# Patient Record
Sex: Female | Born: 1935 | Race: Black or African American | Hispanic: No | Smoking: Never smoker
Health system: Southern US, Community
[De-identification: ages and names within clinical notes are randomized; demographics above are authoritative.]

## PROBLEM LIST (undated history)

## (undated) DIAGNOSIS — C8590 Non-Hodgkin lymphoma, unspecified, unspecified site: Secondary | ICD-10-CM

## (undated) DIAGNOSIS — I10 Essential (primary) hypertension: Secondary | ICD-10-CM

## (undated) DIAGNOSIS — E876 Hypokalemia: Principal | ICD-10-CM

## (undated) DIAGNOSIS — C7951 Secondary malignant neoplasm of bone: Secondary | ICD-10-CM

## (undated) DIAGNOSIS — R197 Diarrhea, unspecified: Secondary | ICD-10-CM

## (undated) DIAGNOSIS — Z923 Personal history of irradiation: Secondary | ICD-10-CM

## (undated) DIAGNOSIS — C9501 Acute leukemia of unspecified cell type, in remission: Secondary | ICD-10-CM

## (undated) DIAGNOSIS — J168 Pneumonia due to other specified infectious organisms: Secondary | ICD-10-CM

## (undated) DIAGNOSIS — R05 Cough: Secondary | ICD-10-CM

## (undated) DIAGNOSIS — Z5189 Encounter for other specified aftercare: Secondary | ICD-10-CM

## (undated) DIAGNOSIS — R569 Unspecified convulsions: Secondary | ICD-10-CM

## (undated) DIAGNOSIS — M1990 Unspecified osteoarthritis, unspecified site: Secondary | ICD-10-CM

## (undated) DIAGNOSIS — H9312 Tinnitus, left ear: Principal | ICD-10-CM

## (undated) DIAGNOSIS — I82401 Acute embolism and thrombosis of unspecified deep veins of right lower extremity: Secondary | ICD-10-CM

## (undated) DIAGNOSIS — C50919 Malignant neoplasm of unspecified site of unspecified female breast: Secondary | ICD-10-CM

## (undated) HISTORY — DX: Personal history of irradiation: Z92.3

## (undated) HISTORY — DX: Hypokalemia: E87.6

## (undated) HISTORY — PX: APPENDECTOMY: SHX54

## (undated) HISTORY — DX: Diarrhea, unspecified: R19.7

## (undated) HISTORY — DX: Encounter for other specified aftercare: Z51.89

## (undated) HISTORY — PX: TONSILLECTOMY: SUR1361

## (undated) HISTORY — DX: Unspecified osteoarthritis, unspecified site: M19.90

## (undated) HISTORY — DX: Tinnitus, left ear: H93.12

## (undated) HISTORY — DX: Non-Hodgkin lymphoma, unspecified, unspecified site: C85.90

## (undated) HISTORY — DX: Cough: R05

## (undated) HISTORY — DX: Unspecified convulsions: R56.9

## (undated) HISTORY — DX: Acute leukemia of unspecified cell type, in remission: C95.01

## (undated) HISTORY — PX: MASTECTOMY: SHX3

## (undated) HISTORY — DX: Acute embolism and thrombosis of unspecified deep veins of right lower extremity: I82.401

## (undated) HISTORY — DX: Pneumonia due to other specified infectious organisms: J16.8

## (undated) HISTORY — DX: Secondary malignant neoplasm of bone: C79.51

## (undated) HISTORY — DX: Essential (primary) hypertension: I10

---

## 1963-11-04 HISTORY — PX: THYROIDECTOMY: SHX17

## 1979-07-05 DIAGNOSIS — R569 Unspecified convulsions: Secondary | ICD-10-CM

## 1979-07-05 HISTORY — DX: Unspecified convulsions: R56.9

## 2002-03-28 ENCOUNTER — Encounter: Payer: Self-pay | Admitting: Emergency Medicine

## 2002-03-28 ENCOUNTER — Emergency Department (HOSPITAL_COMMUNITY): Admission: EM | Admit: 2002-03-28 | Discharge: 2002-03-28 | Payer: Self-pay | Admitting: Emergency Medicine

## 2005-01-24 ENCOUNTER — Emergency Department (HOSPITAL_COMMUNITY): Admission: EM | Admit: 2005-01-24 | Discharge: 2005-01-24 | Payer: Self-pay | Admitting: Emergency Medicine

## 2007-11-04 DIAGNOSIS — Z5189 Encounter for other specified aftercare: Secondary | ICD-10-CM

## 2007-11-04 DIAGNOSIS — IMO0001 Reserved for inherently not codable concepts without codable children: Secondary | ICD-10-CM

## 2007-11-04 HISTORY — DX: Encounter for other specified aftercare: Z51.89

## 2007-11-04 HISTORY — DX: Reserved for inherently not codable concepts without codable children: IMO0001

## 2008-08-03 DIAGNOSIS — C50919 Malignant neoplasm of unspecified site of unspecified female breast: Secondary | ICD-10-CM

## 2008-08-03 DIAGNOSIS — C50912 Malignant neoplasm of unspecified site of left female breast: Secondary | ICD-10-CM

## 2008-08-03 HISTORY — DX: Malignant neoplasm of unspecified site of unspecified female breast: C50.919

## 2008-08-29 ENCOUNTER — Inpatient Hospital Stay (HOSPITAL_COMMUNITY): Admission: EM | Admit: 2008-08-29 | Discharge: 2008-09-04 | Payer: Self-pay | Admitting: Emergency Medicine

## 2008-08-30 ENCOUNTER — Ambulatory Visit: Payer: Self-pay | Admitting: Oncology

## 2008-08-31 ENCOUNTER — Encounter: Admission: RE | Admit: 2008-08-31 | Discharge: 2008-08-31 | Payer: Self-pay | Admitting: Internal Medicine

## 2008-08-31 ENCOUNTER — Encounter (INDEPENDENT_AMBULATORY_CARE_PROVIDER_SITE_OTHER): Payer: Self-pay | Admitting: Diagnostic Radiology

## 2008-09-01 ENCOUNTER — Ambulatory Visit: Payer: Self-pay | Admitting: Cardiology

## 2008-09-01 ENCOUNTER — Ambulatory Visit: Payer: Self-pay | Admitting: Oncology

## 2008-09-01 ENCOUNTER — Encounter: Payer: Self-pay | Admitting: Oncology

## 2008-09-06 ENCOUNTER — Ambulatory Visit: Admission: RE | Admit: 2008-09-06 | Discharge: 2008-11-01 | Payer: Self-pay | Admitting: Radiation Oncology

## 2008-09-07 LAB — CBC WITH DIFFERENTIAL (CANCER CENTER ONLY)
BASO%: 1 % (ref 0.0–2.0)
Eosinophils Absolute: 0.4 10*3/uL (ref 0.0–0.5)
HCT: 24.8 % — ABNORMAL LOW (ref 34.8–46.6)
HGB: 8.2 g/dL — ABNORMAL LOW (ref 11.6–15.9)
LYMPH#: 4.7 10*3/uL — ABNORMAL HIGH (ref 0.9–3.3)
MCV: 77 fL — ABNORMAL LOW (ref 81–101)
MONO#: 0.9 10*3/uL (ref 0.1–0.9)
NEUT%: 51.4 % (ref 39.6–80.0)
RBC: 3.21 10*6/uL — ABNORMAL LOW (ref 3.70–5.32)
RDW: 17 % — ABNORMAL HIGH (ref 10.5–14.6)
WBC: 12.5 10*3/uL — ABNORMAL HIGH (ref 3.9–10.0)

## 2008-09-07 LAB — CMP (CANCER CENTER ONLY)
Alkaline Phosphatase: 58 U/L (ref 26–84)
BUN, Bld: 9 mg/dL (ref 7–22)
CO2: 26 mEq/L (ref 18–33)
Creat: 0.7 mg/dl (ref 0.6–1.2)
Glucose, Bld: 107 mg/dL (ref 73–118)
Sodium: 141 mEq/L (ref 128–145)
Total Bilirubin: 0.4 mg/dl (ref 0.20–1.60)

## 2008-09-07 LAB — IRON AND TIBC
%SAT: 15 % — ABNORMAL LOW (ref 20–55)
Iron: 47 ug/dL (ref 42–145)
TIBC: 306 ug/dL (ref 250–470)
UIBC: 259 ug/dL

## 2008-09-07 LAB — FERRITIN: Ferritin: 626 ng/mL — ABNORMAL HIGH (ref 10–291)

## 2008-09-12 ENCOUNTER — Ambulatory Visit (HOSPITAL_COMMUNITY): Admission: RE | Admit: 2008-09-12 | Discharge: 2008-09-12 | Payer: Self-pay | Admitting: Oncology

## 2008-09-19 ENCOUNTER — Encounter (HOSPITAL_COMMUNITY): Admission: RE | Admit: 2008-09-19 | Discharge: 2008-11-02 | Payer: Self-pay | Admitting: Oncology

## 2008-09-19 LAB — CBC WITH DIFFERENTIAL (CANCER CENTER ONLY)
BASO#: 0.1 10*3/uL (ref 0.0–0.2)
HCT: 23.1 % — ABNORMAL LOW (ref 34.8–46.6)
HGB: 7.6 g/dL — ABNORMAL LOW (ref 11.6–15.9)
LYMPH#: 3.7 10*3/uL — ABNORMAL HIGH (ref 0.9–3.3)
LYMPH%: 39.5 % (ref 14.0–48.0)
MCHC: 32.7 g/dL (ref 32.0–36.0)
MCV: 78 fL — ABNORMAL LOW (ref 81–101)
MONO#: 0.4 10*3/uL (ref 0.1–0.9)
NEUT%: 51.9 % (ref 39.6–80.0)
WBC: 9.4 10*3/uL (ref 3.9–10.0)

## 2008-09-19 LAB — BASIC METABOLIC PANEL - CANCER CENTER ONLY
Calcium: 9.3 mg/dL (ref 8.0–10.3)
Creat: 0.7 mg/dl (ref 0.6–1.2)
Sodium: 136 mEq/L (ref 128–145)

## 2008-09-22 ENCOUNTER — Ambulatory Visit: Payer: Self-pay | Admitting: Oncology

## 2008-09-22 LAB — TYPE & CROSSMATCH - CHCC SATELLITE

## 2008-10-02 LAB — CMP (CANCER CENTER ONLY)
ALT(SGPT): 20 U/L (ref 10–47)
AST: 26 U/L (ref 11–38)
Albumin: 3.1 g/dL — ABNORMAL LOW (ref 3.3–5.5)
Alkaline Phosphatase: 59 U/L (ref 26–84)
BUN, Bld: 10 mg/dL (ref 7–22)
Calcium: 9.5 mg/dL (ref 8.0–10.3)
Chloride: 104 mEq/L (ref 98–108)
Potassium: 4.2 mEq/L (ref 3.3–4.7)

## 2008-10-02 LAB — CBC WITH DIFFERENTIAL (CANCER CENTER ONLY)
BASO#: 0 10*3/uL (ref 0.0–0.2)
EOS%: 4.2 % (ref 0.0–7.0)
HCT: 32.6 % — ABNORMAL LOW (ref 34.8–46.6)
HGB: 10.7 g/dL — ABNORMAL LOW (ref 11.6–15.9)
LYMPH%: 35.5 % (ref 14.0–48.0)
MCH: 26.7 pg (ref 26.0–34.0)
MCHC: 32.8 g/dL (ref 32.0–36.0)
MONO%: 13.6 % — ABNORMAL HIGH (ref 0.0–13.0)
NEUT%: 46 % (ref 39.6–80.0)

## 2008-10-16 LAB — CBC WITH DIFFERENTIAL (CANCER CENTER ONLY)
BASO%: 0.6 % (ref 0.0–2.0)
EOS%: 5.3 % (ref 0.0–7.0)
Eosinophils Absolute: 0.3 10*3/uL (ref 0.0–0.5)
LYMPH%: 17.9 % (ref 14.0–48.0)
MCH: 26.5 pg (ref 26.0–34.0)
MCV: 80 fL — ABNORMAL LOW (ref 81–101)
MONO%: 5.6 % (ref 0.0–13.0)
NEUT#: 3.9 10*3/uL (ref 1.5–6.5)
Platelets: 289 10*3/uL (ref 145–400)
RBC: 3.72 10*6/uL (ref 3.70–5.32)
RDW: 16.7 % — ABNORMAL HIGH (ref 10.5–14.6)

## 2008-10-16 LAB — BASIC METABOLIC PANEL - CANCER CENTER ONLY
BUN, Bld: 11 mg/dL (ref 7–22)
Calcium: 9.7 mg/dL (ref 8.0–10.3)
Chloride: 104 mEq/L (ref 98–108)
Creat: 0.9 mg/dl (ref 0.6–1.2)

## 2008-10-30 ENCOUNTER — Ambulatory Visit: Payer: Self-pay | Admitting: Oncology

## 2008-10-31 LAB — CMP (CANCER CENTER ONLY)
ALT(SGPT): 19 U/L (ref 10–47)
AST: 28 U/L (ref 11–38)
Alkaline Phosphatase: 72 U/L (ref 26–84)
CO2: 28 mEq/L (ref 18–33)
Creat: 0.9 mg/dl (ref 0.6–1.2)
Sodium: 139 mEq/L (ref 128–145)
Total Bilirubin: 0.5 mg/dl (ref 0.20–1.60)
Total Protein: 8.7 g/dL — ABNORMAL HIGH (ref 6.4–8.1)

## 2008-10-31 LAB — CBC WITH DIFFERENTIAL (CANCER CENTER ONLY)
BASO%: 0.7 % (ref 0.0–2.0)
Eosinophils Absolute: 0.2 10*3/uL (ref 0.0–0.5)
LYMPH#: 1 10*3/uL (ref 0.9–3.3)
LYMPH%: 20.9 % (ref 14.0–48.0)
MONO#: 0.6 10*3/uL (ref 0.1–0.9)
NEUT#: 2.8 10*3/uL (ref 1.5–6.5)
Platelets: 273 10*3/uL (ref 145–400)
RBC: 4.13 10*6/uL (ref 3.70–5.32)
RDW: 16 % — ABNORMAL HIGH (ref 10.5–14.6)
WBC: 4.6 10*3/uL (ref 3.9–10.0)

## 2008-11-02 ENCOUNTER — Ambulatory Visit (HOSPITAL_COMMUNITY): Admission: RE | Admit: 2008-11-02 | Discharge: 2008-11-02 | Payer: Self-pay | Admitting: Oncology

## 2008-11-08 LAB — CBC WITH DIFFERENTIAL (CANCER CENTER ONLY)
BASO#: 0 10*3/uL (ref 0.0–0.2)
Eosinophils Absolute: 0.2 10*3/uL (ref 0.0–0.5)
HCT: 32.1 % — ABNORMAL LOW (ref 34.8–46.6)
HGB: 10.6 g/dL — ABNORMAL LOW (ref 11.6–15.9)
LYMPH%: 12.1 % — ABNORMAL LOW (ref 14.0–48.0)
MCH: 26.1 pg (ref 26.0–34.0)
MCV: 79 fL — ABNORMAL LOW (ref 81–101)
MONO#: 0.3 10*3/uL (ref 0.1–0.9)
MONO%: 6.1 % (ref 0.0–13.0)
Platelets: 248 10*3/uL (ref 145–400)
RBC: 4.05 10*6/uL (ref 3.70–5.32)
WBC: 5.5 10*3/uL (ref 3.9–10.0)

## 2008-11-08 LAB — CMP (CANCER CENTER ONLY)
ALT(SGPT): 15 U/L (ref 10–47)
AST: 24 U/L (ref 11–38)
Albumin: 3.4 g/dL (ref 3.3–5.5)
Alkaline Phosphatase: 59 U/L (ref 26–84)
Calcium: 10 mg/dL (ref 8.0–10.3)
Chloride: 103 mEq/L (ref 98–108)
Potassium: 3.8 mEq/L (ref 3.3–4.7)
Sodium: 137 mEq/L (ref 128–145)
Total Protein: 8.5 g/dL — ABNORMAL HIGH (ref 6.4–8.1)

## 2008-12-01 ENCOUNTER — Telehealth: Payer: Self-pay | Admitting: Internal Medicine

## 2008-12-07 LAB — CBC WITH DIFFERENTIAL (CANCER CENTER ONLY)
HCT: 30.4 % — ABNORMAL LOW (ref 34.8–46.6)
MCHC: 32.7 g/dL (ref 32.0–36.0)
Platelets: 275 10*3/uL (ref 145–400)
RDW: 14.8 % — ABNORMAL HIGH (ref 10.5–14.6)
WBC: 6.9 10*3/uL (ref 3.9–10.0)

## 2008-12-07 LAB — MANUAL DIFFERENTIAL (CHCC SATELLITE)
ANC (CHCC HP manual diff): 4.8 10*3/uL (ref 1.5–6.7)
BASO: 1 % (ref 0–2)
LYMPH: 17 % (ref 14–48)
MONO: 12 % (ref 0–13)
Metamyelocytes: 2 % — ABNORMAL HIGH (ref 0–0)
Myelocytes: 2 % — ABNORMAL HIGH (ref 0–0)
PLT EST ~~LOC~~: ADEQUATE
SEG: 46 % (ref 40–75)

## 2008-12-07 LAB — BASIC METABOLIC PANEL - CANCER CENTER ONLY
BUN, Bld: 9 mg/dL (ref 7–22)
CO2: 27 mEq/L (ref 18–33)
Calcium: 9.4 mg/dL (ref 8.0–10.3)
Glucose, Bld: 107 mg/dL (ref 73–118)
Potassium: 4.1 mEq/L (ref 3.3–4.7)

## 2008-12-20 ENCOUNTER — Ambulatory Visit: Payer: Self-pay | Admitting: Oncology

## 2008-12-21 LAB — CMP (CANCER CENTER ONLY)
ALT(SGPT): 11 U/L (ref 10–47)
AST: 27 U/L (ref 11–38)
Albumin: 3.5 g/dL (ref 3.3–5.5)
Alkaline Phosphatase: 99 U/L — ABNORMAL HIGH (ref 26–84)
BUN, Bld: 11 mg/dL (ref 7–22)
Calcium: 9.8 mg/dL (ref 8.0–10.3)
Chloride: 102 mEq/L (ref 98–108)
Potassium: 4 mEq/L (ref 3.3–4.7)
Sodium: 139 mEq/L (ref 128–145)
Total Protein: 8.3 g/dL — ABNORMAL HIGH (ref 6.4–8.1)

## 2008-12-21 LAB — CBC WITH DIFFERENTIAL (CANCER CENTER ONLY)
BASO#: 0 10*3/uL (ref 0.0–0.2)
EOS%: 1.1 % (ref 0.0–7.0)
Eosinophils Absolute: 0.1 10*3/uL (ref 0.0–0.5)
HGB: 11 g/dL — ABNORMAL LOW (ref 11.6–15.9)
LYMPH%: 14.5 % (ref 14.0–48.0)
MCH: 27.7 pg (ref 26.0–34.0)
MCHC: 34.1 g/dL (ref 32.0–36.0)
MCV: 81 fL (ref 81–101)
MONO%: 5.1 % (ref 0.0–13.0)
NEUT#: 5 10*3/uL (ref 1.5–6.5)
Platelets: 307 10*3/uL (ref 145–400)
RBC: 3.98 10*6/uL (ref 3.70–5.32)

## 2008-12-27 LAB — CBC WITH DIFFERENTIAL (CANCER CENTER ONLY)
BASO#: 0.1 10*3/uL (ref 0.0–0.2)
Eosinophils Absolute: 0.1 10*3/uL (ref 0.0–0.5)
HGB: 10.4 g/dL — ABNORMAL LOW (ref 11.6–15.9)
LYMPH#: 1 10*3/uL (ref 0.9–3.3)
MCH: 27.1 pg (ref 26.0–34.0)
MONO#: 0.6 10*3/uL (ref 0.1–0.9)
MONO%: 16.9 % — ABNORMAL HIGH (ref 0.0–13.0)
NEUT#: 1.8 10*3/uL (ref 1.5–6.5)
Platelets: 216 10*3/uL (ref 145–400)
RBC: 3.84 10*6/uL (ref 3.70–5.32)
WBC: 3.5 10*3/uL — ABNORMAL LOW (ref 3.9–10.0)

## 2008-12-27 LAB — BASIC METABOLIC PANEL - CANCER CENTER ONLY
BUN, Bld: 8 mg/dL (ref 7–22)
Calcium: 9.9 mg/dL (ref 8.0–10.3)
Chloride: 102 mEq/L (ref 98–108)
Creat: 0.8 mg/dl (ref 0.6–1.2)

## 2009-01-11 LAB — CMP (CANCER CENTER ONLY)
Albumin: 3.2 g/dL — ABNORMAL LOW (ref 3.3–5.5)
Alkaline Phosphatase: 89 U/L — ABNORMAL HIGH (ref 26–84)
BUN, Bld: 11 mg/dL (ref 7–22)
CO2: 21 mEq/L (ref 18–33)
Calcium: 10 mg/dL (ref 8.0–10.3)
Chloride: 102 mEq/L (ref 98–108)
Glucose, Bld: 179 mg/dL — ABNORMAL HIGH (ref 73–118)
Potassium: 3.9 mEq/L (ref 3.3–4.7)

## 2009-01-11 LAB — CBC WITH DIFFERENTIAL (CANCER CENTER ONLY)
BASO#: 0 10*3/uL (ref 0.0–0.2)
EOS%: 0.8 % (ref 0.0–7.0)
HGB: 10.4 g/dL — ABNORMAL LOW (ref 11.6–15.9)
LYMPH%: 15.6 % (ref 14.0–48.0)
MCH: 27.2 pg (ref 26.0–34.0)
MCHC: 33.3 g/dL (ref 32.0–36.0)
MCV: 82 fL (ref 81–101)
MONO%: 6.3 % (ref 0.0–13.0)
NEUT#: 4.9 10*3/uL (ref 1.5–6.5)
Platelets: 313 10*3/uL (ref 145–400)

## 2009-01-11 LAB — CANCER ANTIGEN 27.29: CA 27.29: 43 U/mL — ABNORMAL HIGH (ref 0–39)

## 2009-01-17 LAB — CMP (CANCER CENTER ONLY)
ALT(SGPT): 15 U/L (ref 10–47)
AST: 17 U/L (ref 11–38)
Albumin: 3.3 g/dL (ref 3.3–5.5)
Alkaline Phosphatase: 95 U/L — ABNORMAL HIGH (ref 26–84)
Potassium: 3.7 mEq/L (ref 3.3–4.7)
Sodium: 136 mEq/L (ref 128–145)
Total Protein: 7.3 g/dL (ref 6.4–8.1)

## 2009-01-17 LAB — MANUAL DIFFERENTIAL (CHCC SATELLITE)
Eos: 4 % (ref 0–7)
MONO: 11 % (ref 0–13)

## 2009-01-17 LAB — CBC WITH DIFFERENTIAL (CANCER CENTER ONLY)
MCH: 27.2 pg (ref 26.0–34.0)
MCV: 82 fL (ref 81–101)
Platelets: 232 10*3/uL (ref 145–400)
RBC: 3.63 10*6/uL — ABNORMAL LOW (ref 3.70–5.32)
RDW: 17.4 % — ABNORMAL HIGH (ref 10.5–14.6)

## 2009-01-22 ENCOUNTER — Ambulatory Visit (HOSPITAL_COMMUNITY): Admission: RE | Admit: 2009-01-22 | Discharge: 2009-01-22 | Payer: Self-pay | Admitting: Oncology

## 2009-01-31 ENCOUNTER — Ambulatory Visit: Payer: Self-pay | Admitting: Oncology

## 2009-02-01 LAB — CBC WITH DIFFERENTIAL (CANCER CENTER ONLY)
BASO#: 0 10*3/uL (ref 0.0–0.2)
BASO%: 0.4 % (ref 0.0–2.0)
EOS%: 0.8 % (ref 0.0–7.0)
HCT: 30.3 % — ABNORMAL LOW (ref 34.8–46.6)
LYMPH#: 0.9 10*3/uL (ref 0.9–3.3)
MCH: 27.3 pg (ref 26.0–34.0)
MCV: 82 fL (ref 81–101)
NEUT#: 5.1 10*3/uL (ref 1.5–6.5)
RBC: 3.71 10*6/uL (ref 3.70–5.32)
RDW: 17.3 % — ABNORMAL HIGH (ref 10.5–14.6)
WBC: 6.8 10*3/uL (ref 3.9–10.0)

## 2009-02-01 LAB — CMP (CANCER CENTER ONLY)
ALT(SGPT): 10 U/L (ref 10–47)
AST: 18 U/L (ref 11–38)
Alkaline Phosphatase: 86 U/L — ABNORMAL HIGH (ref 26–84)
Creat: 0.8 mg/dl (ref 0.6–1.2)
Sodium: 141 mEq/L (ref 128–145)
Total Bilirubin: 0.4 mg/dl (ref 0.20–1.60)
Total Protein: 7.8 g/dL (ref 6.4–8.1)

## 2009-02-08 LAB — MANUAL DIFFERENTIAL (CHCC SATELLITE)
ALC: 0.6 10*3/uL (ref 0.6–2.2)
ANC (CHCC HP manual diff): 4.3 10*3/uL (ref 1.5–6.7)
BASO: 1 % (ref 0–2)
Band Neutrophils: 13 % — ABNORMAL HIGH (ref 0–10)
Platelet Morphology: NORMAL
SEG: 54 % (ref 40–75)

## 2009-02-08 LAB — CMP (CANCER CENTER ONLY)
BUN, Bld: 7 mg/dL (ref 7–22)
CO2: 26 mEq/L (ref 18–33)
Creat: 0.8 mg/dl (ref 0.6–1.2)
Glucose, Bld: 115 mg/dL (ref 73–118)
Total Bilirubin: 0.5 mg/dl (ref 0.20–1.60)
Total Protein: 7 g/dL (ref 6.4–8.1)

## 2009-02-08 LAB — CBC WITH DIFFERENTIAL (CANCER CENTER ONLY)
HCT: 27.5 % — ABNORMAL LOW (ref 34.8–46.6)
HGB: 9.3 g/dL — ABNORMAL LOW (ref 11.6–15.9)
MCHC: 33.8 g/dL (ref 32.0–36.0)
MCV: 82 fL (ref 81–101)
RDW: 16.4 % — ABNORMAL HIGH (ref 10.5–14.6)

## 2009-02-08 LAB — TECHNOLOGIST REVIEW CHCC SATELLITE

## 2009-02-22 LAB — CMP (CANCER CENTER ONLY)
ALT(SGPT): 11 U/L (ref 10–47)
CO2: 22 mEq/L (ref 18–33)
Creat: 0.7 mg/dl (ref 0.6–1.2)
Total Bilirubin: 0.5 mg/dl (ref 0.20–1.60)

## 2009-02-22 LAB — CBC WITH DIFFERENTIAL (CANCER CENTER ONLY)
Eosinophils Absolute: 0 10*3/uL (ref 0.0–0.5)
HCT: 29.9 % — ABNORMAL LOW (ref 34.8–46.6)
HGB: 10.3 g/dL — ABNORMAL LOW (ref 11.6–15.9)
LYMPH%: 17.2 % (ref 14.0–48.0)
MCV: 82 fL (ref 81–101)
MONO#: 0.2 10*3/uL (ref 0.1–0.9)
NEUT%: 75.5 % (ref 39.6–80.0)
RBC: 3.65 10*6/uL — ABNORMAL LOW (ref 3.70–5.32)
WBC: 3.6 10*3/uL — ABNORMAL LOW (ref 3.9–10.0)

## 2009-02-22 LAB — IRON AND TIBC
%SAT: 13 % — ABNORMAL LOW (ref 20–55)
Iron: 34 ug/dL — ABNORMAL LOW (ref 42–145)
UIBC: 220 ug/dL

## 2009-02-22 LAB — FERRITIN: Ferritin: 288 ng/mL (ref 10–291)

## 2009-02-28 ENCOUNTER — Encounter (HOSPITAL_COMMUNITY): Admission: RE | Admit: 2009-02-28 | Discharge: 2009-03-08 | Payer: Self-pay | Admitting: Oncology

## 2009-02-28 LAB — CBC WITH DIFFERENTIAL (CANCER CENTER ONLY)
HGB: 8.9 g/dL — ABNORMAL LOW (ref 11.6–15.9)
MCH: 27.8 pg (ref 26.0–34.0)
RBC: 3.21 10*6/uL — ABNORMAL LOW (ref 3.70–5.32)
WBC: 5.3 10*3/uL (ref 3.9–10.0)

## 2009-02-28 LAB — MANUAL DIFFERENTIAL (CHCC SATELLITE)
Band Neutrophils: 11 % — ABNORMAL HIGH (ref 0–10)
Eos: 5 % (ref 0–7)
MONO: 7 % (ref 0–13)
PLT EST ~~LOC~~: ADEQUATE
SEG: 61 % (ref 40–75)

## 2009-02-28 LAB — CMP (CANCER CENTER ONLY)
ALT(SGPT): 14 U/L (ref 10–47)
AST: 15 U/L (ref 11–38)
Creat: 0.8 mg/dl (ref 0.6–1.2)
Total Bilirubin: 0.7 mg/dl (ref 0.20–1.60)

## 2009-03-15 ENCOUNTER — Ambulatory Visit: Payer: Self-pay | Admitting: Oncology

## 2009-03-19 LAB — CMP (CANCER CENTER ONLY)
ALT(SGPT): 26 U/L (ref 10–47)
Alkaline Phosphatase: 70 U/L (ref 26–84)
Glucose, Bld: 124 mg/dL — ABNORMAL HIGH (ref 73–118)
Sodium: 138 mEq/L (ref 128–145)
Total Bilirubin: 0.6 mg/dl (ref 0.20–1.60)
Total Protein: 7.7 g/dL (ref 6.4–8.1)

## 2009-03-19 LAB — CBC WITH DIFFERENTIAL (CANCER CENTER ONLY)
BASO#: 0 10*3/uL (ref 0.0–0.2)
Eosinophils Absolute: 0.5 10*3/uL (ref 0.0–0.5)
HGB: 10.4 g/dL — ABNORMAL LOW (ref 11.6–15.9)
LYMPH%: 9.8 % — ABNORMAL LOW (ref 14.0–48.0)
MCH: 27.4 pg (ref 26.0–34.0)
MCV: 81 fL (ref 81–101)
MONO#: 1 10*3/uL — ABNORMAL HIGH (ref 0.1–0.9)
MONO%: 15.3 % — ABNORMAL HIGH (ref 0.0–13.0)
Platelets: 393 10*3/uL (ref 145–400)
RBC: 3.78 10*6/uL (ref 3.70–5.32)
WBC: 6.3 10*3/uL (ref 3.9–10.0)

## 2009-03-21 ENCOUNTER — Ambulatory Visit (HOSPITAL_COMMUNITY): Admission: RE | Admit: 2009-03-21 | Discharge: 2009-03-22 | Payer: Self-pay | Admitting: General Surgery

## 2009-03-21 ENCOUNTER — Encounter (INDEPENDENT_AMBULATORY_CARE_PROVIDER_SITE_OTHER): Payer: Self-pay | Admitting: General Surgery

## 2009-04-09 ENCOUNTER — Ambulatory Visit: Payer: Self-pay | Admitting: Internal Medicine

## 2009-04-09 DIAGNOSIS — L738 Other specified follicular disorders: Secondary | ICD-10-CM

## 2009-04-09 DIAGNOSIS — Z853 Personal history of malignant neoplasm of breast: Secondary | ICD-10-CM

## 2009-04-19 ENCOUNTER — Encounter: Payer: Self-pay | Admitting: Internal Medicine

## 2009-04-19 LAB — CBC WITH DIFFERENTIAL (CANCER CENTER ONLY)
BASO#: 0 10*3/uL (ref 0.0–0.2)
Eosinophils Absolute: 0.6 10*3/uL — ABNORMAL HIGH (ref 0.0–0.5)
HGB: 9.5 g/dL — ABNORMAL LOW (ref 11.6–15.9)
LYMPH#: 0.9 10*3/uL (ref 0.9–3.3)
MONO#: 0.7 10*3/uL (ref 0.1–0.9)
MONO%: 10 % (ref 0.0–13.0)
NEUT#: 5.1 10*3/uL (ref 1.5–6.5)
Platelets: 433 10*3/uL — ABNORMAL HIGH (ref 145–400)
RBC: 3.55 10*6/uL — ABNORMAL LOW (ref 3.70–5.32)
WBC: 7.4 10*3/uL (ref 3.9–10.0)

## 2009-04-19 LAB — CMP (CANCER CENTER ONLY)
ALT(SGPT): 22 U/L (ref 10–47)
CO2: 28 mEq/L (ref 18–33)
Calcium: 10 mg/dL (ref 8.0–10.3)
Chloride: 110 mEq/L — ABNORMAL HIGH (ref 98–108)
Sodium: 142 mEq/L (ref 128–145)
Total Protein: 7.9 g/dL (ref 6.4–8.1)

## 2009-04-30 ENCOUNTER — Ambulatory Visit: Payer: Self-pay | Admitting: Internal Medicine

## 2009-05-21 ENCOUNTER — Ambulatory Visit: Payer: Self-pay | Admitting: Oncology

## 2009-05-23 ENCOUNTER — Encounter: Payer: Self-pay | Admitting: Internal Medicine

## 2009-05-23 LAB — CBC WITH DIFFERENTIAL (CANCER CENTER ONLY)
BASO%: 0.4 % (ref 0.0–2.0)
EOS%: 11.9 % — ABNORMAL HIGH (ref 0.0–7.0)
LYMPH#: 0.9 10*3/uL (ref 0.9–3.3)
MCH: 26.1 pg (ref 26.0–34.0)
MCHC: 33.5 g/dL (ref 32.0–36.0)
MONO%: 9.5 % (ref 0.0–13.0)
NEUT#: 3.6 10*3/uL (ref 1.5–6.5)
Platelets: 333 10*3/uL (ref 145–400)
RDW: 16.3 % — ABNORMAL HIGH (ref 10.5–14.6)

## 2009-05-23 LAB — BASIC METABOLIC PANEL - CANCER CENTER ONLY
Calcium: 9.5 mg/dL (ref 8.0–10.3)
Sodium: 142 mEq/L (ref 128–145)

## 2009-05-24 ENCOUNTER — Ambulatory Visit: Payer: Self-pay | Admitting: Internal Medicine

## 2009-05-24 DIAGNOSIS — M1710 Unilateral primary osteoarthritis, unspecified knee: Secondary | ICD-10-CM

## 2009-06-05 ENCOUNTER — Telehealth: Payer: Self-pay | Admitting: Internal Medicine

## 2009-06-08 ENCOUNTER — Ambulatory Visit: Payer: Self-pay | Admitting: Internal Medicine

## 2009-06-08 LAB — CONVERTED CEMR LAB
Albumin: 2.9 g/dL — ABNORMAL LOW (ref 3.5–5.2)
Alkaline Phosphatase: 67 units/L (ref 39–117)
BUN: 6 mg/dL (ref 6–23)
Basophils Absolute: 0.1 10*3/uL (ref 0.0–0.1)
Bilirubin, Direct: 0.1 mg/dL (ref 0.0–0.3)
CO2: 28 meq/L (ref 19–32)
Creatinine, Ser: 0.6 mg/dL (ref 0.4–1.2)
Eosinophils Absolute: 0.7 10*3/uL (ref 0.0–0.7)
Eosinophils Relative: 10.8 % — ABNORMAL HIGH (ref 0.0–5.0)
GFR calc non Af Amer: 126.31 mL/min (ref >60)
Ketones, ur: NEGATIVE mg/dL
Lymphocytes Relative: 11.9 % — ABNORMAL LOW (ref 12.0–46.0)
Lymphs Abs: 0.8 10*3/uL (ref 0.7–4.0)
Monocytes Absolute: 0.5 10*3/uL (ref 0.1–1.0)
Neutro Abs: 4.8 10*3/uL (ref 1.4–7.7)
Nitrite: NEGATIVE
Potassium: 3.7 meq/L (ref 3.5–5.1)
Sodium: 143 meq/L (ref 135–145)
Total Protein: 6.3 g/dL (ref 6.0–8.3)
Urine Glucose: NEGATIVE mg/dL
Urobilinogen, UA: 0.2 (ref 0.0–1.0)

## 2009-06-14 ENCOUNTER — Ambulatory Visit: Payer: Self-pay | Admitting: Internal Medicine

## 2009-08-16 ENCOUNTER — Ambulatory Visit: Payer: Self-pay | Admitting: Internal Medicine

## 2009-08-16 DIAGNOSIS — D6489 Other specified anemias: Secondary | ICD-10-CM

## 2009-08-16 LAB — CONVERTED CEMR LAB
Basophils Absolute: 0 10*3/uL (ref 0.0–0.1)
CO2: 28 meq/L (ref 19–32)
Eosinophils Relative: 7.8 % — ABNORMAL HIGH (ref 0.0–5.0)
Folate: 7.6 ng/mL
GFR calc non Af Amer: 105.67 mL/min (ref >60)
Glucose, Bld: 86 mg/dL (ref 70–99)
Lymphocytes Relative: 13.3 % (ref 12.0–46.0)
Monocytes Relative: 7.4 % (ref 3.0–12.0)
Neutro Abs: 5.1 10*3/uL (ref 1.4–7.7)
Platelets: 215 10*3/uL (ref 150.0–400.0)
Potassium: 3.6 meq/L (ref 3.5–5.1)
RDW: 16.2 % — ABNORMAL HIGH (ref 11.5–14.6)
Sed Rate: 18 mm/hr (ref 0–22)
Uric Acid, Serum: 6 mg/dL (ref 2.4–7.0)
Vitamin B-12: 336 pg/mL (ref 211–911)
WBC: 7 10*3/uL (ref 4.5–10.5)

## 2009-08-30 ENCOUNTER — Ambulatory Visit: Payer: Self-pay | Admitting: Oncology

## 2009-09-04 ENCOUNTER — Encounter: Payer: Self-pay | Admitting: Internal Medicine

## 2009-09-04 LAB — CMP (CANCER CENTER ONLY)
Albumin: 3.3 g/dL (ref 3.3–5.5)
CO2: 27 mEq/L (ref 18–33)
Glucose, Bld: 100 mg/dL (ref 73–118)
Potassium: 3.4 mEq/L (ref 3.3–4.7)
Sodium: 142 mEq/L (ref 128–145)
Total Protein: 7.5 g/dL (ref 6.4–8.1)

## 2009-09-04 LAB — CBC WITH DIFFERENTIAL (CANCER CENTER ONLY)
BASO#: 0 10*3/uL (ref 0.0–0.2)
BASO%: 0.6 % (ref 0.0–2.0)
EOS%: 11.8 % — ABNORMAL HIGH (ref 0.0–7.0)
HCT: 39.4 % (ref 34.8–46.6)
HGB: 13 g/dL (ref 11.6–15.9)
LYMPH#: 0.9 10*3/uL (ref 0.9–3.3)
MCH: 27.7 pg (ref 26.0–34.0)
MCHC: 32.9 g/dL (ref 32.0–36.0)
MONO%: 7.2 % (ref 0.0–13.0)
NEUT#: 3.8 10*3/uL (ref 1.5–6.5)
NEUT%: 65 % (ref 39.6–80.0)
RDW: 14.4 % (ref 10.5–14.6)

## 2009-09-04 LAB — CANCER ANTIGEN 27.29: CA 27.29: 19 U/mL (ref 0–39)

## 2009-09-07 ENCOUNTER — Ambulatory Visit (HOSPITAL_COMMUNITY): Admission: RE | Admit: 2009-09-07 | Discharge: 2009-09-07 | Payer: Self-pay | Admitting: Oncology

## 2009-09-10 ENCOUNTER — Encounter: Admission: RE | Admit: 2009-09-10 | Discharge: 2009-10-03 | Payer: Self-pay | Admitting: Oncology

## 2009-09-20 ENCOUNTER — Ambulatory Visit: Payer: Self-pay | Admitting: Internal Medicine

## 2009-09-20 ENCOUNTER — Encounter: Payer: Self-pay | Admitting: Internal Medicine

## 2009-09-26 ENCOUNTER — Ambulatory Visit: Payer: Self-pay | Admitting: Oncology

## 2009-10-04 ENCOUNTER — Ambulatory Visit: Payer: Self-pay | Admitting: Internal Medicine

## 2009-11-23 ENCOUNTER — Ambulatory Visit: Payer: Self-pay | Admitting: Oncology

## 2009-12-06 ENCOUNTER — Ambulatory Visit: Payer: Self-pay | Admitting: Internal Medicine

## 2009-12-06 ENCOUNTER — Encounter: Payer: Self-pay | Admitting: Internal Medicine

## 2010-01-03 ENCOUNTER — Ambulatory Visit: Payer: Self-pay | Admitting: Oncology

## 2010-02-07 ENCOUNTER — Ambulatory Visit: Payer: Self-pay | Admitting: Oncology

## 2010-02-12 ENCOUNTER — Encounter: Payer: Self-pay | Admitting: Internal Medicine

## 2010-02-12 LAB — CMP (CANCER CENTER ONLY)
CO2: 27 mEq/L (ref 18–33)
Chloride: 107 mEq/L (ref 98–108)
Glucose, Bld: 89 mg/dL (ref 73–118)
Sodium: 137 mEq/L (ref 128–145)

## 2010-02-12 LAB — CBC WITH DIFFERENTIAL (CANCER CENTER ONLY)
BASO#: 0 10*3/uL (ref 0.0–0.2)
BASO%: 0.4 % (ref 0.0–2.0)
HCT: 37.2 % (ref 34.8–46.6)
HGB: 12.8 g/dL (ref 11.6–15.9)
LYMPH#: 1.2 10*3/uL (ref 0.9–3.3)
LYMPH%: 19.5 % (ref 14.0–48.0)
MCH: 29 pg (ref 26.0–34.0)
MCV: 84 fL (ref 81–101)
MONO#: 0.4 10*3/uL (ref 0.1–0.9)
MONO%: 6.4 % (ref 0.0–13.0)
NEUT#: 4.3 10*3/uL (ref 1.5–6.5)
Platelets: 231 10*3/uL (ref 145–400)
RDW: 12.7 % (ref 10.5–14.6)

## 2010-02-12 LAB — LACTATE DEHYDROGENASE: LDH: 191 U/L (ref 94–250)

## 2010-03-06 ENCOUNTER — Ambulatory Visit: Payer: Self-pay | Admitting: Internal Medicine

## 2010-04-22 ENCOUNTER — Ambulatory Visit: Payer: Self-pay | Admitting: Oncology

## 2010-05-20 ENCOUNTER — Ambulatory Visit: Payer: Self-pay | Admitting: Internal Medicine

## 2010-07-03 ENCOUNTER — Ambulatory Visit: Payer: Self-pay | Admitting: Oncology

## 2010-08-08 ENCOUNTER — Ambulatory Visit: Payer: Self-pay | Admitting: Oncology

## 2010-08-14 ENCOUNTER — Encounter: Payer: Self-pay | Admitting: Internal Medicine

## 2010-08-14 LAB — CBC WITH DIFFERENTIAL (CANCER CENTER ONLY)
BASO#: 0 10*3/uL (ref 0.0–0.2)
EOS%: 3 % (ref 0.0–7.0)
Eosinophils Absolute: 0.2 10*3/uL (ref 0.0–0.5)
MCH: 28.7 pg (ref 26.0–34.0)
MCV: 84 fL (ref 81–101)
MONO%: 7 % (ref 0.0–13.0)
NEUT#: 4.1 10*3/uL (ref 1.5–6.5)
Platelets: 216 10*3/uL (ref 145–400)
RBC: 4.35 10*6/uL (ref 3.70–5.32)
WBC: 7.3 10*3/uL (ref 3.9–10.0)

## 2010-08-14 LAB — CMP (CANCER CENTER ONLY)
Alkaline Phosphatase: 78 U/L (ref 26–84)
BUN, Bld: 12 mg/dL (ref 7–22)
Calcium: 9.7 mg/dL (ref 8.0–10.3)
Glucose, Bld: 89 mg/dL (ref 73–118)
Potassium: 3.9 mEq/L (ref 3.3–4.7)
Sodium: 136 mEq/L (ref 128–145)
Total Bilirubin: 0.4 mg/dl (ref 0.20–1.60)

## 2010-08-14 LAB — TECHNOLOGIST REVIEW CHCC SATELLITE

## 2010-08-30 ENCOUNTER — Ambulatory Visit: Payer: Self-pay | Admitting: Oncology

## 2010-09-16 ENCOUNTER — Ambulatory Visit: Payer: Self-pay | Admitting: Internal Medicine

## 2010-09-16 DIAGNOSIS — J309 Allergic rhinitis, unspecified: Secondary | ICD-10-CM | POA: Insufficient documentation

## 2010-11-03 DIAGNOSIS — C9501 Acute leukemia of unspecified cell type, in remission: Secondary | ICD-10-CM

## 2010-11-03 HISTORY — DX: Acute leukemia of unspecified cell type, in remission: C95.01

## 2010-11-07 ENCOUNTER — Ambulatory Visit: Payer: Self-pay | Admitting: Oncology

## 2010-11-11 ENCOUNTER — Encounter: Payer: Self-pay | Admitting: Internal Medicine

## 2010-11-11 LAB — COMPREHENSIVE METABOLIC PANEL
ALT: 8 U/L (ref 0–35)
AST: 13 U/L (ref 0–37)
Albumin: 4.1 g/dL (ref 3.5–5.2)
Alkaline Phosphatase: 76 U/L (ref 39–117)
BUN: 15 mg/dL (ref 6–23)
CO2: 23 mEq/L (ref 19–32)
Calcium: 9.6 mg/dL (ref 8.4–10.5)
Chloride: 106 mEq/L (ref 96–112)
Creatinine, Ser: 0.79 mg/dL (ref 0.40–1.20)
Glucose, Bld: 96 mg/dL (ref 70–99)
Potassium: 3.9 mEq/L (ref 3.5–5.3)
Sodium: 139 mEq/L (ref 135–145)
Total Bilirubin: 0.2 mg/dL — ABNORMAL LOW (ref 0.3–1.2)
Total Protein: 7.4 g/dL (ref 6.0–8.3)

## 2010-11-11 LAB — CBC WITH DIFFERENTIAL/PLATELET
BASO%: 0.2 % (ref 0.0–2.0)
Basophils Absolute: 0 10*3/uL (ref 0.0–0.1)
EOS%: 1.9 % (ref 0.0–7.0)
Eosinophils Absolute: 0.2 10*3/uL (ref 0.0–0.5)
HCT: 36.3 % (ref 34.8–46.6)
HGB: 11.9 g/dL (ref 11.6–15.9)
LYMPH%: 50.6 % — ABNORMAL HIGH (ref 14.0–49.7)
MCH: 28.8 pg (ref 25.1–34.0)
MCHC: 32.9 g/dL (ref 31.5–36.0)
MCV: 87.6 fL (ref 79.5–101.0)
MONO#: 0.4 10*3/uL (ref 0.1–0.9)
MONO%: 4.3 % (ref 0.0–14.0)
NEUT#: 3.5 10*3/uL (ref 1.5–6.5)
NEUT%: 43 % (ref 38.4–76.8)
Platelets: 243 10*3/uL (ref 145–400)
RBC: 4.15 10*6/uL (ref 3.70–5.45)
RDW: 14.2 % (ref 11.2–14.5)
WBC: 8.2 10*3/uL (ref 3.9–10.3)
lymph#: 4.1 10*3/uL — ABNORMAL HIGH (ref 0.9–3.3)

## 2010-11-11 LAB — LACTATE DEHYDROGENASE: LDH: 143 U/L (ref 94–250)

## 2010-11-11 LAB — CANCER ANTIGEN 27.29: CA 27.29: 39 U/mL (ref 0–39)

## 2010-11-18 ENCOUNTER — Ambulatory Visit
Admission: RE | Admit: 2010-11-18 | Discharge: 2010-11-18 | Payer: Self-pay | Source: Home / Self Care | Attending: Internal Medicine | Admitting: Internal Medicine

## 2010-11-18 ENCOUNTER — Other Ambulatory Visit: Payer: Self-pay | Admitting: Internal Medicine

## 2010-11-18 DIAGNOSIS — E890 Postprocedural hypothyroidism: Secondary | ICD-10-CM | POA: Insufficient documentation

## 2010-11-18 LAB — CBC WITH DIFFERENTIAL/PLATELET
Basophils Absolute: 0 10*3/uL (ref 0.0–0.1)
Basophils Relative: 0.4 % (ref 0.0–3.0)
Eosinophils Absolute: 0.3 10*3/uL (ref 0.0–0.7)
Eosinophils Relative: 3 % (ref 0.0–5.0)
HCT: 37.1 % (ref 36.0–46.0)
Hemoglobin: 12.4 g/dL (ref 12.0–15.0)
Lymphocytes Relative: 41.8 % (ref 12.0–46.0)
Lymphs Abs: 3.6 10*3/uL (ref 0.7–4.0)
MCHC: 33.4 g/dL (ref 30.0–36.0)
MCV: 88.2 fl (ref 78.0–100.0)
Monocytes Absolute: 0.6 10*3/uL (ref 0.1–1.0)
Monocytes Relative: 6.7 % (ref 3.0–12.0)
Neutro Abs: 4.1 10*3/uL (ref 1.4–7.7)
Neutrophils Relative %: 48.1 % (ref 43.0–77.0)
Platelets: 233 10*3/uL (ref 150.0–400.0)
RBC: 4.21 Mil/uL (ref 3.87–5.11)
RDW: 14.4 % (ref 11.5–14.6)
WBC: 8.6 10*3/uL (ref 4.5–10.5)

## 2010-11-18 LAB — BASIC METABOLIC PANEL
BUN: 14 mg/dL (ref 6–23)
CO2: 28 mEq/L (ref 19–32)
Calcium: 9.5 mg/dL (ref 8.4–10.5)
Chloride: 99 mEq/L (ref 96–112)
Creatinine, Ser: 0.7 mg/dL (ref 0.4–1.2)
GFR: 105.3 mL/min (ref >60.00)
Glucose, Bld: 64 mg/dL — ABNORMAL LOW (ref 70–99)
Potassium: 4.5 mEq/L (ref 3.5–5.1)
Sodium: 134 mEq/L — ABNORMAL LOW (ref 135–145)

## 2010-11-18 LAB — HEPATIC FUNCTION PANEL
ALT: 11 U/L (ref 0–35)
AST: 17 U/L (ref 0–37)
Albumin: 3.8 g/dL (ref 3.5–5.2)
Alkaline Phosphatase: 71 U/L (ref 39–117)
Bilirubin, Direct: 0.1 mg/dL (ref 0.0–0.3)
Total Bilirubin: 0.6 mg/dL (ref 0.3–1.2)
Total Protein: 7.4 g/dL (ref 6.0–8.3)

## 2010-11-18 LAB — CK: Total CK: 105 U/L (ref 7–177)

## 2010-11-18 LAB — TSH: TSH: 2.41 u[IU]/mL (ref 0.35–5.50)

## 2010-11-18 LAB — IBC PANEL
Iron: 55 ug/dL (ref 42–145)
Saturation Ratios: 16.5 % — ABNORMAL LOW (ref 20.0–50.0)
Transferrin: 238 mg/dL (ref 212.0–360.0)

## 2010-11-18 LAB — B12 AND FOLATE PANEL
Folate: 15 ng/mL (ref >5.9)
Vitamin B-12: 318 pg/mL (ref 211–911)

## 2010-11-22 ENCOUNTER — Other Ambulatory Visit (HOSPITAL_COMMUNITY): Payer: Self-pay | Admitting: Oncology

## 2010-11-22 DIAGNOSIS — C50919 Malignant neoplasm of unspecified site of unspecified female breast: Secondary | ICD-10-CM

## 2010-12-03 NOTE — Assessment & Plan Note (Signed)
Summary: 2 MTH FU  STC   Vital Signs:  Patient profile:   76 year old female Height:      64 inches Weight:      132 pounds BMI:     22.74 O2 Sat:      97 % on Room air Temp:     97.8 degrees F oral Pulse rate:   61 / minute Pulse rhythm:   regular Resp:     16 per minute BP sitting:   110 / 70  (left arm) Cuff size:   large  Vitals Entered By: Rock Nephew CMA (December 06, 2009 10:01 AM)  O2 Flow:  Room air  Primary Care Provider:  [name] MD   History of Present Illness: She returns c/o a gradual, recent development of left knee pain with no trauma or injury.  Preventive Screening-Counseling & Management  Alcohol-Tobacco     Alcohol drinks/day: 0     Smoking Status: never  Hep-HIV-STD-Contraception     Hepatitis Risk: no risk noted     HIV Risk: no risk noted     STD Risk: no risk noted      Sexual History:  not sexually active.        Drug Use:  never.        Blood Transfusions:  no.    Medications Prior to Update: 1)  Zyrtec Hives Relief 10 Mg Tabs (Cetirizine Hcl) .... Once Daily As Needed For Itching 2)  Triamcinolone Acetonide 0.1 % Crea (Triamcinolone Acetonide) .... Apply To Rash/itching Two Times A Day 3)  Advil 200 Mg Tabs (Ibuprofen) .... Prn 4)  Femara 2.5 Mg Tabs (Letrozole) 5)  Promethazine-Dm 6.25-15 Mg/2ml Syrp (Promethazine-Dm) .... 5-10 Ml By Mouth Three Times A Day As Needed For Cough  Current Medications (verified): 1)  Zyrtec Hives Relief 10 Mg Tabs (Cetirizine Hcl) .... Once Daily As Needed For Itching 2)  Triamcinolone Acetonide 0.1 % Crea (Triamcinolone Acetonide) .... Apply To Rash/itching Two Times A Day 3)  Advil 200 Mg Tabs (Ibuprofen) .... Prn 4)  Femara 2.5 Mg Tabs (Letrozole)  Allergies (verified): 1)  ! Penicillin 2)  ! Sulfa 3)  ! Tramadol Hcl (Tramadol Hcl)  Past History:  Past Medical History: Reviewed history from 04/09/2009 and no changes required. Breast cancer, hx of  Past Surgical  History: Reviewed history from 04/09/2009 and no changes required. Mastectomy  Family History: Reviewed history from 04/09/2009 and no changes required. Family History of Alcoholism/Addiction  Social History: Reviewed history from 04/09/2009 and no changes required. Single Widow/Widower Never Smoked Alcohol use-no Drug use-no Regular exercise-no Hepatitis Risk:  no risk noted HIV Risk:  no risk noted STD Risk:  no risk noted Sexual History:  not sexually active Drug Use:  never Blood Transfusions:  no  Review of Systems  The patient denies anorexia, fever, weight loss, weight gain, chest pain, dyspnea on exertion, peripheral edema, prolonged cough, headaches, hemoptysis, abdominal pain, hematuria, suspicious skin lesions, enlarged lymph nodes, and angioedema.   MS:  Complains of joint pain, joint swelling, and stiffness; denies joint redness, loss of strength, low back pain, muscle aches, cramps, and muscle weakness.  Physical Exam  General:  alert, well-developed, well-nourished, well-hydrated, appropriate dress, cooperative to examination, and good hygiene.   Head:  normocephalic and atraumatic.   Mouth:  Oral mucosa and oropharynx without lesions or exudates.  Teeth in good repair. Neck:  supple, full ROM, no masses, no thyromegaly, and normal carotid upstroke.  Lungs:  Normal respiratory effort, chest expands symmetrically. Lungs are clear to auscultation, no crackles or wheezes. Heart:  Normal rate and regular rhythm. S1 and S2 normal without gallop, murmur, click, rub or other extra sounds. Abdomen:  soft, non-tender, normal bowel sounds, no distention, no masses, no guarding, no hepatomegaly, and no splenomegaly.   Msk:  decreased ROM and mild joint swelling in left know with DJD changesno joint warmth, no redness over joints, no joint instability, no crepitation, decreased ROM, and joint swelling.   Pulses:  R and L carotid,radial,femoral,dorsalis pedis and posterior  tibial pulses are full and equal bilaterally Extremities:  No clubbing, cyanosis, edema, or deformity noted with normal full range of motion of all joints.   Neurologic:  No cranial nerve deficits noted. Station and gait are normal. Plantar reflexes are down-going bilaterally. DTRs are symmetrical throughout. Sensory, motor and coordinative functions appear intact. Skin:  she has areas of PIPA but no active skin lesions Cervical Nodes:  no anterior cervical adenopathy and no posterior cervical adenopathy.   Axillary Nodes:  no R axillary adenopathy and no L axillary adenopathy.   Inguinal Nodes:  no R inguinal adenopathy and no L inguinal adenopathy.   Psych:  Cognition and judgment appear intact. Alert and cooperative with normal attention span and concentration. No apparent delusions, illusions, hallucinations   Impression & Recommendations:  Problem # 1:  KNEE PAIN, ACUTE (ICD-719.46) Assessment New  Her updated medication list for this problem includes:    Advil 200 Mg Tabs (Ibuprofen) .[name]... Prn  Orders: T-Knee Comp Left 4 Views [phone])  Discussed strengthening exercises, use of ice or heat, and medications.   Complete Medication List: 1)  Zyrtec Hives Relief 10 Mg Tabs (Cetirizine hcl) .... Once daily as needed for itching 2)  Triamcinolone Acetonide 0.1 % Crea (Triamcinolone acetonide) .... Apply to rash/itching two times a day 3)  Advil 200 Mg Tabs (Ibuprofen) .... Prn 4)  Femara 2.5 Mg Tabs (Letrozole)  Patient Instructions: 1)  Please schedule a follow-up appointment in 3 months. 2)  Take 650-1000mg  of Tylenol every 4-6 hours as needed for relief of pain or comfort of fever AVOID taking more than 4000mg   in a 24 hour period (can cause liver damage in higher doses). 3)  Take 400-600mg  of Ibuprofen (Advil, Motrin) with food every 4-6 hours as needed for relief of pain or comfort of fever. 4)  You may move around but avoid painful motions. Apply ice to sore area for 20  minutes 3-4 times a day for 2-3 days.

## 2010-12-03 NOTE — Assessment & Plan Note (Signed)
Summary: 3 MO ROV /NWS #   Vital Signs:  Patient profile:   76 year old female Height:      64 inches Weight:      138 pounds BMI:     23.77 O2 Sat:      98 % on Room air Temp:     97.8 degrees F Pulse rate:   54 / minute Pulse rhythm:   regular Resp:     16 per minute BP sitting:   122 / 84  (right arm) Cuff size:   large  Vitals Entered By: Rock Nephew CMA (Mar 06, 2010 10:06 AM)  O2 Flow:  Room air CC: follow-up visit   Primary Care Provider:  [name] MD  CC:  follow-up visit.  History of Present Illness: She returns c/o persistent pain and swelling in her left knee. Her xray showed severe DJD. She does not want to think about having a TKR done. She gets some relief with Advil as needed.   Preventive Screening-Counseling & Management  Alcohol-Tobacco     Alcohol drinks/day: 0     Smoking Status: never  Hep-HIV-STD-Contraception     Hepatitis Risk: no risk noted     HIV Risk: no risk noted     STD Risk: no risk noted      Sexual History:  not sexually active.        Drug Use:  never.        Blood Transfusions:  no.    Medications Prior to Update: 1)  Zyrtec Hives Relief 10 Mg Tabs (Cetirizine Hcl) .... Once Daily As Needed For Itching 2)  Triamcinolone Acetonide 0.1 % Crea (Triamcinolone Acetonide) .... Apply To Rash/itching Two Times A Day 3)  Advil 200 Mg Tabs (Ibuprofen) .... Prn 4)  Femara 2.5 Mg Tabs (Letrozole)  Current Medications (verified): 1)  Zyrtec Hives Relief 10 Mg Tabs (Cetirizine Hcl) .... Once Daily As Needed For Itching 2)  Triamcinolone Acetonide 0.1 % Crea (Triamcinolone Acetonide) .... Apply To Rash/itching Two Times A Day 3)  Advil 200 Mg Tabs (Ibuprofen) .... Prn 4)  Femara 2.5 Mg Tabs (Letrozole)  Allergies (verified): 1)  ! Penicillin 2)  ! Sulfa 3)  ! Tramadol Hcl (Tramadol Hcl)  Past History:  Past Medical History: Reviewed history from 04/09/2009 and no changes required. Breast cancer, hx of  Past Surgical  History: Reviewed history from 04/09/2009 and no changes required. Mastectomy  Family History: Reviewed history from 04/09/2009 and no changes required. Family History of Alcoholism/Addiction  Social History: Reviewed history from 04/09/2009 and no changes required. Single Widow/Widower Never Smoked Alcohol use-no Drug use-no Regular exercise-no  Review of Systems MS:  Complains of joint pain and joint swelling; denies joint redness, loss of strength, low back pain, muscle aches, muscle weakness, and stiffness.  Physical Exam  General:  alert, well-developed, well-nourished, well-hydrated, appropriate dress, cooperative to examination, and good hygiene.   Mouth:  Oral mucosa and oropharynx without lesions or exudates.  Teeth in good repair. Neck:  supple, full ROM, no masses, no thyromegaly, and normal carotid upstroke.   Lungs:  Normal respiratory effort, chest expands symmetrically. Lungs are clear to auscultation, no crackles or wheezes. Heart:  Normal rate and regular rhythm. S1 and S2 normal without gallop, murmur, click, rub or other extra sounds. Abdomen:  soft, non-tender, normal bowel sounds, no distention, no masses, no guarding, no hepatomegaly, and no splenomegaly.   Msk:  decreased ROM and mild joint swelling in left  know with DJD changesno joint warmth, no redness over joints, no joint instability, no crepitation, decreased ROM, and joint swelling.   Pulses:  R and L carotid,radial,femoral,dorsalis pedis and posterior tibial pulses are full and equal bilaterally Extremities:  No clubbing, cyanosis, edema, or deformity noted with normal full range of motion of all joints.   Neurologic:  No cranial nerve deficits noted. Station and gait are normal. Plantar reflexes are down-going bilaterally. DTRs are symmetrical throughout. Sensory, motor and coordinative functions appear intact. Skin:  she has areas of PIPA but no active skin lesions Cervical Nodes:  no anterior  cervical adenopathy and no posterior cervical adenopathy.   Psych:  Cognition and judgment appear intact. Alert and cooperative with normal attention span and concentration. No apparent delusions, illusions, hallucinations Additional Exam:  left knee was prepped and draped in sterile fashion. local anesthesia was obtained with 2% lido with epi. to infiltrate the local area. a 25 gauge 1.5 inch needle was used to enter the joint space of the left knee and 1/2 cc of plain lido and 1/2 cc of depo-medrol (80 mg/ml) was injected into the joint. no fluid was aspirated. she tolerated it well with no blood loss. she ambulated without difficulty afterwards.   Impression & Recommendations:  Problem # 1:  KNEE PAIN, ACUTE (ICD-719.46) Assessment Unchanged  Her updated medication list for this problem includes:    Advil 200 Mg Tabs (Ibuprofen) .[name]... Prn  Orders: Joint Aspirate / Injection, Large (20610) Depo- Medrol 40mg  (J1030)  Problem # 2:  DEGENERATIVE JOINT DISEASE, BOTH KNEES, SEVERE (ICD-715.96) Assessment: Deteriorated  Her updated medication list for this problem includes:    Advil 200 Mg Tabs (Ibuprofen) .[name]... Prn  Orders: Joint Aspirate / Injection, Large (20610) Depo- Medrol 40mg  (J1030)  Complete Medication List: 1)  Zyrtec Hives Relief 10 Mg Tabs (Cetirizine hcl) .... Once daily as needed for itching 2)  Triamcinolone Acetonide 0.1 % Crea (Triamcinolone acetonide) .... Apply to rash/itching two times a day 3)  Advil 200 Mg Tabs (Ibuprofen) .... Prn 4)  Femara 2.5 Mg Tabs (Letrozole)  Patient Instructions: 1)  Please schedule a follow-up appointment in 2 months. 2)  Take 650-1000mg  of Tylenol every 4-6 hours as needed for relief of pain or comfort of fever AVOID taking more than 4000mg   in a 24 hour period (can cause liver damage in higher doses). 3)  Take 400-600mg  of Ibuprofen (Advil, Motrin) with food every 4-6 hours as needed for relief of pain or comfort of fever.

## 2010-12-03 NOTE — Letter (Signed)
Summary: Results Follow-up Letter  [facility]  [address] [place], [place] [address]   Phone: [phone]  Fax: [phone]    12/06/2009  [address] [place], [place]  [address]  Dear Ms. Arruda,   The following are the results of your recent test(s):  Test     Result     Knee Xray     severe arthritis   _________________________________________________________  Please call for an appointment in 1-2 months _________________________________________________________ _________________________________________________________ _________________________________________________________  Sincerely,  [name] MD [facility]

## 2010-12-03 NOTE — Assessment & Plan Note (Signed)
Summary: 4 mos f/u #/cd   Vital Signs:  Patient profile:   76 year old female Menstrual status:  postmenopausal Height:      64 inches Weight:      148 pounds BMI:     25.50 O2 Sat:      98 % on Room air Temp:     98.0 degrees F oral Pulse rate:   56 / minute Pulse rhythm:   regular Resp:     16 per minute BP sitting:   142 / 84  (left arm) Cuff size:   large  Vitals Entered By: Rock Nephew CMA (September 16, 2010 9:34 AM)  Nutrition Counseling: Patient's BMI is greater than 25 and therefore counseled on weight management options.  O2 Flow:  Room air CC: follow-up visit//flu shot, URI symptoms Is Patient Diabetic? No Pain Assessment Patient in pain? no       Does patient need assistance? Functional Status Self care Ambulation Normal     Menstrual Status postmenopausal   Primary Care Shenice Dolder:  [name] MD  CC:  follow-up visit//flu shot and URI symptoms.  History of Present Illness:  URI Symptoms      This is a 76 year old woman who presents with URI symptoms.  The symptoms began 1 month ago.  The severity is described as mild.  The patient reports nasal congestion and clear nasal discharge, but denies purulent nasal discharge, sore throat, dry cough, productive cough, earache, and sick contacts.  The patient denies fever, stiff neck, dyspnea, wheezing, rash, vomiting, diarrhea, use of an antipyretic, and response to antipyretic.  The patient also reports itchy throat, sneezing, seasonal symptoms, and response to antihistamine.  The patient denies headache, muscle aches, and severe fatigue.  The patient denies the following risk factors for Strep sinusitis: unilateral facial pain, unilateral nasal discharge, poor response to decongestant, double sickening, tooth pain, Strep exposure, tender adenopathy, and absence of cough.    Current Medications (verified): 1)  Zyrtec Hives Relief 10 Mg Tabs (Cetirizine Hcl) .... Once Daily As Needed For Itching 2)   Triamcinolone Acetonide 0.1 % Crea (Triamcinolone Acetonide) .... Apply To Rash/itching Two Times A Day 3)  Femara 2.5 Mg Tabs (Letrozole) 4)  Celebrex 200 Mg Caps (Celecoxib) .... One By Mouth Once Daily For Knee Pain  Allergies (verified): 1)  ! Penicillin 2)  ! Sulfa 3)  ! Tramadol Hcl (Tramadol Hcl)  Past History:  Past Medical History: Last updated: 04/09/2009 Breast cancer, hx of  Past Surgical History: Last updated: 04/09/2009 Mastectomy  Family History: Last updated: 04/09/2009 Family History of Alcoholism/Addiction  Social History: Last updated: 04/09/2009 Single Widow/Widower Never Smoked Alcohol use-no Drug use-no Regular exercise-no  Risk Factors: Alcohol Use: 0 (05/20/2010) Exercise: no (04/09/2009)  Risk Factors: Smoking Status: never (05/20/2010)  Family History: Reviewed history from 04/09/2009 and no changes required. Family History of Alcoholism/Addiction  Social History: Reviewed history from 04/09/2009 and no changes required. Single Widow/Widower Never Smoked Alcohol use-no Drug use-no Regular exercise-no  Review of Systems  The patient denies anorexia, fever, weight loss, weight gain, decreased hearing, hoarseness, chest pain, syncope, dyspnea on exertion, peripheral edema, prolonged cough, headaches, hemoptysis, abdominal pain, hematuria, suspicious skin lesions, transient blindness, depression, enlarged lymph nodes, and angioedema.   ENT:  Complains of nasal congestion, postnasal drainage, and ringing in ears; denies decreased hearing, difficulty swallowing, ear discharge, earache, hoarseness, nosebleeds, sinus pressure, and sore throat. MS:  Complains of joint pain and stiffness; denies joint redness, joint  swelling, loss of strength, low back pain, muscle aches, muscle weakness, and thoracic pain.  Physical Exam  General:  alert, well-developed, well-nourished, well-hydrated, appropriate dress, normal appearance, healthy-appearing,  cooperative to examination, and good hygiene.   Head:  normocephalic, atraumatic, no abnormalities observed, no abnormalities palpated, and no alopecia.   Eyes:  vision grossly intact, pupils equal, pupils round, pupils reactive to light, and no injection.   Ears:  R ear normal and L ear normal.   Nose:  no external deformity, no nasal discharge, no airflow obstruction, no intranasal foreign body, no nasal polyps, no nasal mucosal lesions, no mucosal friability, no active bleeding or clots, no sinus percussion tenderness, no septum abnormalities, mucosal pallor, and mucosal edema.   Mouth:  good dentition, pharynx pink and moist, no erythema, no exudates, no posterior lymphoid hypertrophy, no postnasal drip, no pharyngeal crowing, no lesions, no tongue abnormalities, no leukoplakia, and no petechiae.   Neck:  supple, full ROM, no masses, no thyromegaly, no thyroid nodules or tenderness, no JVD, normal carotid upstroke, no carotid bruits, no cervical lymphadenopathy, and no neck tenderness.   Lungs:  normal respiratory effort, no intercostal retractions, no accessory muscle use, normal breath sounds, no dullness, no fremitus, no crackles, and no wheezes.   Heart:  normal rate, regular rhythm, no murmur, no gallop, no rub, and no JVD.   Abdomen:  soft, non-tender, normal bowel sounds, no distention, no masses, no guarding, no rigidity, no rebound tenderness, no abdominal hernia, no inguinal hernia, no hepatomegaly, and no splenomegaly.   Msk:  normal ROM, no joint tenderness, no joint swelling, no joint warmth, no redness over joints, no joint deformities, no joint instability, and no crepitation.   Pulses:  R and L carotid,radial,femoral,dorsalis pedis and posterior tibial pulses are full and equal bilaterally Extremities:  No clubbing, cyanosis, edema, or deformity noted with normal full range of motion of all joints.   Neurologic:  No cranial nerve deficits noted. Station and gait are normal. Plantar  reflexes are down-going bilaterally. DTRs are symmetrical throughout. Sensory, motor and coordinative functions appear intact. Skin:  turgor normal, color normal, no rashes, no suspicious lesions, no ecchymoses, no petechiae, no purpura, no ulcerations, and no edema.   Cervical Nodes:  no anterior cervical adenopathy and no posterior cervical adenopathy.   Axillary Nodes:  no R axillary adenopathy and no L axillary adenopathy.   Inguinal Nodes:  no R inguinal adenopathy and no L inguinal adenopathy.   Psych:  Cognition and judgment appear intact. Alert and cooperative with normal attention span and concentration. No apparent delusions, illusions, hallucinations   Impression & Recommendations:  Problem # 1:  ALLERGIC RHINITIS (ICD-477.9) Assessment New try depo-medrol IM Her updated medication list for this problem includes:    Zyrtec Hives Relief 10 Mg Tabs (Cetirizine hcl) ..... Once daily as needed for itching  Problem # 2:  DEGENERATIVE JOINT DISEASE, BOTH KNEES, SEVERE (ICD-715.96) Assessment: Unchanged  Her updated medication list for this problem includes:    Celebrex 200 Mg Caps (Celecoxib) ..... One by mouth once daily for knee pain  Complete Medication List: 1)  Zyrtec Hives Relief 10 Mg Tabs (Cetirizine hcl) .... Once daily as needed for itching 2)  Triamcinolone Acetonide 0.1 % Crea (Triamcinolone acetonide) .... Apply to rash/itching two times a day 3)  Femara 2.5 Mg Tabs (Letrozole) 4)  Celebrex 200 Mg Caps (Celecoxib) .... One by mouth once daily for knee pain  Other Orders: Flu Vaccine 76yrs + MEDICARE PATIENTS (E4540)  Administration Flu vaccine - MCR [id number])  Patient Instructions: 1)  Please schedule a follow-up appointment in 2 months. 2)  It is important that you exercise regularly at least 20 minutes 5 times a week. If you develop chest pain, have severe difficulty breathing, or feel very tired , stop exercising immediately and seek medical attention. 3)  You need  to lose weight. Consider a lower calorie diet and regular exercise.  4)  Take 650-1000mg  of Tylenol every 4-6 hours as needed for relief of pain or comfort of fever AVOID taking more than 4000mg   in a 24 hour period (can cause liver damage in higher doses). Prescriptions: CELEBREX 200 MG CAPS (CELECOXIB) One by mouth once daily for knee pain  #30 x 0   Entered and Authorized by:   [name] MD   Signed by:   [name] MD on 09/16/2010   Method used:   Samples Given   RxID:   [id number] ZYRTEC HIVES RELIEF 10 MG TABS (CETIRIZINE HCL) once daily as needed for itching  #30 x 3   Entered and Authorized by:   [name] MD   Signed by:   [name] MD on 09/16/2010   Method used:   Print then Give to Patient   RxID:   [id number]  Flu Vaccine Consent Questions     Do you have a history of severe allergic reactions to this vaccine? no    Any prior history of allergic reactions to egg and/or gelatin? no    Do you have a sensitivity to the preservative Thimersol? no    Do you have a past history of Guillan-Barre Syndrome? no    Do you currently have an acute febrile illness? no    Have you ever had a severe reaction to latex? no    Vaccine information given and explained to patient? yes    Are you currently pregnant? no    Lot Number:[id number]   Exp Date:05/03/2011   Site Given Right Deltoid IM  Orders Added: 1)  Flu Vaccine 45yrs + MEDICARE PATIENTS [Q2039] 2)  Administration Flu vaccine - MCR [G0008] 3)  Est. Patient Level IV [63875]    [email or web address]

## 2010-12-03 NOTE — Letter (Signed)
Summary: [facility]  [facility]   Imported By: Lester Yantis 09/02/2010 08:01:12  _____________________________________________________________________  External Attachment:    Type:   Image     Comment:   External Document

## 2010-12-03 NOTE — Assessment & Plan Note (Signed)
Summary: 2 mos f/u #/ cd   Vital Signs:  Patient profile:   76 year old female Height:      64 inches Weight:      138.25 pounds BMI:     23.82 O2 Sat:      96 % on Room air Temp:     98.2 degrees F oral Pulse rate:   60 / minute Pulse rhythm:   regular Resp:     16 per minute BP sitting:   148 / 72  (right arm) Cuff size:   large  Vitals Entered By: Rock Nephew CMA (May 20, 2010 10:02 AM)  O2 Flow:  Room air CC: 4 mo follow up Is Patient Diabetic? No Pain Assessment Patient in pain? no        Primary Care Provider:  [name] MD  CC:  4 mo follow up.  History of Present Illness: She returns c/o persistent left knee pain with no recent injury and no swelling. The pain occurs with activity and ROM. She does not get much relief from Advil.  Preventive Screening-Counseling & Management  Alcohol-Tobacco     Alcohol drinks/day: 0     Smoking Status: never  Current Medications (verified): 1)  Zyrtec Hives Relief 10 Mg Tabs (Cetirizine Hcl) .... Once Daily As Needed For Itching 2)  Triamcinolone Acetonide 0.1 % Crea (Triamcinolone Acetonide) .... Apply To Rash/itching Two Times A Day 3)  Femara 2.5 Mg Tabs (Letrozole) 4)  Celebrex 200 Mg Caps (Celecoxib) .... One By Mouth Once Daily For Knee Pain  Allergies (verified): 1)  ! Penicillin 2)  ! Sulfa 3)  ! Tramadol Hcl (Tramadol Hcl)  Past History:  Past Medical History: Last updated: 04/09/2009 Breast cancer, hx of  Past Surgical History: Last updated: 04/09/2009 Mastectomy  Family History: Last updated: 04/09/2009 Family History of Alcoholism/Addiction  Social History: Last updated: 04/09/2009 Single Widow/Widower Never Smoked Alcohol use-no Drug use-no Regular exercise-no  Risk Factors: Alcohol Use: 0 (05/20/2010) Exercise: no (04/09/2009)  Risk Factors: Smoking Status: never (05/20/2010)  Family History: Reviewed history from 04/09/2009 and no changes required. Family History of  Alcoholism/Addiction  Social History: Reviewed history from 04/09/2009 and no changes required. Single Widow/Widower Never Smoked Alcohol use-no Drug use-no Regular exercise-no  Review of Systems  The patient denies chest pain, syncope, dyspnea on exertion, peripheral edema, hemoptysis, abdominal pain, enlarged lymph nodes, and angioedema.   Derm:  Denies changes in color of skin, changes in nail beds, dryness, excessive perspiration, flushing, insect bite(s), itching, lesion(s), poor wound healing, and rash.  Physical Exam  General:  alert, well-developed, well-nourished, well-hydrated, appropriate dress, cooperative to examination, and good hygiene.   Head:  normocephalic and atraumatic.   Eyes:  vision grossly intact, pupils equal, pupils round, and pupils reactive to light.   Mouth:  Oral mucosa and oropharynx without lesions or exudates.  Teeth in good repair. Neck:  supple, full ROM, no masses, no thyromegaly, and normal carotid upstroke.   Lungs:  Normal respiratory effort, chest expands symmetrically. Lungs are clear to auscultation, no crackles or wheezes. Heart:  Normal rate and regular rhythm. S1 and S2 normal without gallop, murmur, click, rub or other extra sounds. Abdomen:  soft, non-tender, normal bowel sounds, no distention, no masses, no guarding, no hepatomegaly, and no splenomegaly.   Msk:  decreased ROM and mild joint swelling in left knee with DJD changes but no joint warmth, no redness over joints, no joint instability, no crepitation, decreased ROM, and joint  swelling.   Pulses:  R and L carotid,radial,femoral,dorsalis pedis and posterior tibial pulses are full and equal bilaterally Extremities:  No clubbing, cyanosis, edema, or deformity noted with normal full range of motion of all joints.   Neurologic:  No cranial nerve deficits noted. Station and gait are normal. Plantar reflexes are down-going bilaterally. DTRs are symmetrical throughout. Sensory, motor and  coordinative functions appear intact. Skin:  turgor normal, color normal, no rashes, no suspicious lesions, no ecchymoses, and no petechiae.   Cervical Nodes:  no anterior cervical adenopathy and no posterior cervical adenopathy.   Psych:  Cognition and judgment appear intact. Alert and cooperative with normal attention span and concentration. No apparent delusions, illusions, hallucinations   Impression & Recommendations:  Problem # 1:  DEGENERATIVE JOINT DISEASE, BOTH KNEES, SEVERE (ICD-715.96) Assessment Unchanged she defers on getting a depo shot in her left knee The following medications were removed from the medication list:    Advil 200 Mg Tabs (Ibuprofen) .[name]... Prn Her updated medication list for this problem includes:    Celebrex 200 Mg Caps (Celecoxib) ..... One by mouth once daily for knee pain  Discussed strengthening exercises, use of ice or heat, and medications.   Problem # 2:  ASTEATOTIC ECZEMA (ICD-706.8) Assessment: Unchanged  Complete Medication List: 1)  Zyrtec Hives Relief 10 Mg Tabs (Cetirizine hcl) .... Once daily as needed for itching 2)  Triamcinolone Acetonide 0.1 % Crea (Triamcinolone acetonide) .... Apply to rash/itching two times a day 3)  Femara 2.5 Mg Tabs (Letrozole) 4)  Celebrex 200 Mg Caps (Celecoxib) .... One by mouth once daily for knee pain  Patient Instructions: 1)  Please schedule a follow-up appointment in 4 months. 2)  Take 650-1000mg  of Tylenol every 4-6 hours as needed for relief of pain or comfort of fever AVOID taking more than 4000mg   in a 24 hour period (can cause liver damage in higher doses). Prescriptions: CELEBREX 200 MG CAPS (CELECOXIB) One by mouth once daily for knee pain  #45 x 0   Entered and Authorized by:   [name] MD   Signed by:   [name] MD on 05/20/2010   Method used:   Samples Given   RxID:   [id number]    Not Administered:    Tetanus Vaccine not given due to: declined    Pneumonia Vaccine  not given due to: declined

## 2010-12-03 NOTE — Letter (Signed)
[facility]   Imported By: Sherian Rein 03/12/2010 13:22:19  _____________________________________________________________________  External Attachment:    Type:   Image     Comment:   External Document

## 2010-12-05 NOTE — Letter (Signed)
Summary: [facility]  [facility]   Imported By: Sherian Rein 11/21/2010 09:04:58  _____________________________________________________________________  External Attachment:    Type:   Image     Comment:   External Document

## 2010-12-05 NOTE — Assessment & Plan Note (Signed)
Summary: 2 MO ROV /[facility]   Vital Signs:  Patient profile:   76 year old female Menstrual status:  postmenopausal Height:      64 inches Weight:      142.50 pounds BMI:     24.55 O2 Sat:      97 % on Room air Temp:     98.5 degrees F oral Pulse rate:   57 / minute Pulse rhythm:   regular Resp:     16 per minute BP sitting:   142 / 70  (left arm) Cuff size:   regular  Vitals Entered By: Rock Nephew CMA (November 18, 2010 9:37 AM)  O2 Flow:  Room air  Primary Care Provider:  [name] MD   History of Present Illness:  Follow-Up Visit      This is a 76 year old woman who presents for Follow-up visit.  The patient denies chest pain, palpitations, dizziness, syncope, edema, SOB, DOE, PND, and orthopnea.  Since the last visit the patient notes no new problems or concerns.  The patient reports taking meds as prescribed and dietary compliance.  When questioned about possible medication side effects, the patient notes none.    Preventive Screening-Counseling & Management  Alcohol-Tobacco     Alcohol drinks/day: 0     Alcohol Counseling: not indicated; patient does not drink     Smoking Status: never     Tobacco Counseling: not indicated; no tobacco use  Hep-HIV-STD-Contraception     Hepatitis Risk: no risk noted     HIV Risk: no risk noted     STD Risk: no risk noted      Sexual History:  not sexually active.        Drug Use:  never.        Blood Transfusions:  no.    Clinical Review Panels:  Immunizations   Last Flu Vaccine:  Fluvax 3+ (09/16/2010)  Diabetes Management   Creatinine:  0.7 (08/16/2009)   Last Flu Vaccine:  Fluvax 3+ (09/16/2010)  CBC   WBC:  7.0 (08/16/2009)   RBC:  4.49 (08/16/2009)   Hgb:  12.7 (08/16/2009)   Hct:  38.1 (08/16/2009)   Platelets:  215.0 (08/16/2009)   MCV  84.9 (08/16/2009)   MCHC  33.2 (08/16/2009)   RDW  16.2 (08/16/2009)   PMN:  71.1 (08/16/2009)   Lymphs:  13.3 (08/16/2009)   Monos:  7.4 (08/16/2009)  Eosinophils:  7.8 (08/16/2009)   Basophil:  0.4 (08/16/2009)  Complete Metabolic Panel   Glucose:  86 (08/16/2009)   Sodium:  140 (08/16/2009)   Potassium:  3.6 (08/16/2009)   Chloride:  103 (08/16/2009)   CO2:  28 (08/16/2009)   BUN:  12 (08/16/2009)   Creatinine:  0.7 (08/16/2009)   Albumin:  2.9 (06/08/2009)   Total Protein:  6.3 (06/08/2009)   Calcium:  9.6 (08/16/2009)   Total Bili:  0.4 (06/08/2009)   Alk Phos:  67 (06/08/2009)   SGPT (ALT):  11 (06/08/2009)   SGOT (AST):  17 (06/08/2009)   Medications Prior to Update: 1)  Zyrtec Hives Relief 10 Mg Tabs (Cetirizine Hcl) .... Once Daily As Needed For Itching 2)  Triamcinolone Acetonide 0.1 % Crea (Triamcinolone Acetonide) .... Apply To Rash/itching Two Times A Day 3)  Femara 2.5 Mg Tabs (Letrozole) 4)  Celebrex 200 Mg Caps (Celecoxib) .... One By Mouth Once Daily For Knee Pain  Current Medications (verified): 1)  Zyrtec Hives Relief 10 Mg Tabs (Cetirizine Hcl) .... Once Daily As  Needed For Itching 2)  Triamcinolone Acetonide 0.1 % Crea (Triamcinolone Acetonide) .... Apply To Rash/itching Two Times A Day 3)  Femara 2.5 Mg Tabs (Letrozole) 4)  Celebrex 200 Mg Caps (Celecoxib) .... One By Mouth Once Daily For Knee Pain  Allergies (verified): 1)  ! Penicillin 2)  ! Sulfa 3)  ! Tramadol Hcl (Tramadol Hcl)  Past History:  Past Medical History: Last updated: 04/09/2009 Breast cancer, hx of  Family History: Last updated: 04/09/2009 Family History of Alcoholism/Addiction  Social History: Last updated: 04/09/2009 Single Widow/Widower Never Smoked Alcohol use-no Drug use-no Regular exercise-no  Risk Factors: Alcohol Use: 0 (11/18/2010) Exercise: no (04/09/2009)  Risk Factors: Smoking Status: never (11/18/2010)  Past Surgical History: Mastectomy Thyroidectomy  Family History: Reviewed history from 04/09/2009 and no changes required. Family History of Alcoholism/Addiction  Social History: Reviewed  history from 04/09/2009 and no changes required. Single Widow/Widower Never Smoked Alcohol use-no Drug use-no Regular exercise-no  Review of Systems  The patient denies anorexia, fever, weight loss, weight gain, chest pain, syncope, dyspnea on exertion, peripheral edema, prolonged cough, headaches, hemoptysis, abdominal pain, hematuria, suspicious skin lesions, difficulty walking, depression, enlarged lymph nodes, and angioedema.   MS:  Complains of joint pain and muscle aches; denies joint redness, joint swelling, loss of strength, low back pain, cramps, muscle weakness, and stiffness. Endo:  Denies cold intolerance, excessive hunger, excessive thirst, excessive urination, heat intolerance, polyuria, and weight change. Heme:  Denies abnormal bruising, bleeding, enlarge lymph nodes, fevers, pallor, and skin discoloration.  Physical Exam  General:  alert, well-developed, well-nourished, well-hydrated, appropriate dress, normal appearance, healthy-appearing, cooperative to examination, and good hygiene.   Head:  normocephalic, atraumatic, no abnormalities observed, no abnormalities palpated, and no alopecia.   Eyes:  vision grossly intact, pupils equal, pupils round, pupils reactive to light, and no injection.   Mouth:  good dentition, pharynx pink and moist, no erythema, no exudates, no posterior lymphoid hypertrophy, no postnasal drip, no pharyngeal crowing, no lesions, no tongue abnormalities, no leukoplakia, and no petechiae.   Neck:  thyroid scar. supple, no masses, no thyromegaly, no thyroid nodules or tenderness, no JVD, normal carotid upstroke, no cervical lymphadenopathy, and no neck tenderness.   Lungs:  normal respiratory effort, no intercostal retractions, no accessory muscle use, normal breath sounds, no dullness, no fremitus, no crackles, and no wheezes.   Heart:  normal rate, regular rhythm, no murmur, no gallop, no rub, and no JVD.   Abdomen:  soft, non-tender, normal bowel  sounds, no distention, no masses, no guarding, no rigidity, no rebound tenderness, no abdominal hernia, no inguinal hernia, no hepatomegaly, and no splenomegaly.   Msk:  normal ROM, no joint tenderness, no joint swelling, no joint warmth, no redness over joints, no joint deformities, no joint instability, and no crepitation.   Pulses:  R and L carotid,radial,femoral,dorsalis pedis and posterior tibial pulses are full and equal bilaterally Extremities:  No clubbing, cyanosis, edema, or deformity noted with normal full range of motion of all joints.   Neurologic:  No cranial nerve deficits noted. Station and gait are normal. Plantar reflexes are down-going bilaterally. DTRs are symmetrical throughout. Sensory, motor and coordinative functions appear intact. Skin:  turgor normal, color normal, no rashes, no suspicious lesions, no ecchymoses, no petechiae, no purpura, no ulcerations, and no edema.   Cervical Nodes:  no anterior cervical adenopathy and no posterior cervical adenopathy.   Axillary Nodes:  no R axillary adenopathy and no L axillary adenopathy.   Psych:  Cognition and  judgment appear intact. Alert and cooperative with normal attention span and concentration. No apparent delusions, illusions, hallucinations   Impression & Recommendations:  Problem # 1:  POSTSURGICAL HYPOTHYROIDISM (ICD-244.0) Assessment New  Orders: Venipuncture (41324) TLB-BMP (Basic Metabolic Panel-BMET) (80048-METABOL) TLB-CBC Platelet - w/Differential (85025-CBCD) TLB-Hepatic/Liver Function Pnl (80076-HEPATIC) TLB-TSH (Thyroid Stimulating Hormone) (84443-TSH) TLB-CK Total Only(Creatine Kinase/CPK) (82550-CK) TLB-B12 + Folate Pnl (40102_72536-U44/IHK) TLB-IBC Pnl (Iron/FE;Transferrin) (83550-IBC)  Problem # 2:  OTHER SPECIFIED ANEMIAS (ICD-285.8) Assessment: Unchanged  Orders: Venipuncture (74259) TLB-BMP (Basic Metabolic Panel-BMET) (80048-METABOL) TLB-CBC Platelet - w/Differential  (85025-CBCD) TLB-Hepatic/Liver Function Pnl (80076-HEPATIC) TLB-TSH (Thyroid Stimulating Hormone) (84443-TSH) TLB-CK Total Only(Creatine Kinase/CPK) (82550-CK) TLB-B12 + Folate Pnl (56387_56433-I95/JOA) TLB-IBC Pnl (Iron/FE;Transferrin) (83550-IBC)  Problem # 3:  DEGENERATIVE JOINT DISEASE, BOTH KNEES, SEVERE (ICD-715.96) Assessment: Unchanged  Her updated medication list for this problem includes:    Celebrex 200 Mg Caps (Celecoxib) ..... One by mouth once daily for knee pain  Orders: Venipuncture (41660) TLB-BMP (Basic Metabolic Panel-BMET) (80048-METABOL) TLB-CBC Platelet - w/Differential (85025-CBCD) TLB-Hepatic/Liver Function Pnl (80076-HEPATIC) TLB-TSH (Thyroid Stimulating Hormone) (84443-TSH) TLB-CK Total Only(Creatine Kinase/CPK) (82550-CK) TLB-IBC Pnl (Iron/FE;Transferrin) (83550-IBC)  Complete Medication List: 1)  Zyrtec Hives Relief 10 Mg Tabs (Cetirizine hcl) .... Once daily as needed for itching 2)  Triamcinolone Acetonide 0.1 % Crea (Triamcinolone acetonide) .... Apply to rash/itching two times a day 3)  Femara 2.5 Mg Tabs (Letrozole) 4)  Celebrex 200 Mg Caps (Celecoxib) .... One by mouth once daily for knee pain  Patient Instructions: 1)  Please schedule a follow-up appointment in 2 months. 2)  It is important that you exercise regularly at least 20 minutes 5 times a week. If you develop chest pain, have severe difficulty breathing, or feel very tired , stop exercising immediately and seek medical attention. 3)  You need to lose weight. Consider a lower calorie diet and regular exercise.  4)  Take 650-1000mg  of Tylenol every 4-6 hours as needed for relief of pain or comfort of fever AVOID taking more than 4000mg   in a 24 hour period (can cause liver damage in higher doses).   Orders Added: 1)  Venipuncture [36415] 2)  TLB-BMP (Basic Metabolic Panel-BMET) [80048-METABOL] 3)  TLB-CBC Platelet - w/Differential [85025-CBCD] 4)  TLB-Hepatic/Liver Function Pnl  [80076-HEPATIC] 5)  TLB-TSH (Thyroid Stimulating Hormone) [84443-TSH] 6)  TLB-CK Total Only(Creatine Kinase/CPK) [82550-CK] 7)  TLB-B12 + Folate Pnl [82746_82607-B12/FOL] 8)  TLB-IBC Pnl (Iron/FE;Transferrin) [83550-IBC] 9)  Est. Patient Level IV [63016]

## 2011-01-06 ENCOUNTER — Encounter (HOSPITAL_BASED_OUTPATIENT_CLINIC_OR_DEPARTMENT_OTHER): Payer: Medicare Other | Admitting: Oncology

## 2011-01-06 ENCOUNTER — Other Ambulatory Visit: Payer: Self-pay | Admitting: Oncology

## 2011-01-06 DIAGNOSIS — C50419 Malignant neoplasm of upper-outer quadrant of unspecified female breast: Secondary | ICD-10-CM

## 2011-01-06 DIAGNOSIS — C7800 Secondary malignant neoplasm of unspecified lung: Secondary | ICD-10-CM

## 2011-01-06 DIAGNOSIS — C801 Malignant (primary) neoplasm, unspecified: Secondary | ICD-10-CM

## 2011-01-06 DIAGNOSIS — C7952 Secondary malignant neoplasm of bone marrow: Secondary | ICD-10-CM

## 2011-01-06 LAB — CBC WITH DIFFERENTIAL/PLATELET
Basophils Absolute: 0.1 10*3/uL (ref 0.0–0.1)
EOS%: 3.6 % (ref 0.0–7.0)
Eosinophils Absolute: 0.3 10*3/uL (ref 0.0–0.5)
HGB: 11.9 g/dL (ref 11.6–15.9)
LYMPH%: 52 % — ABNORMAL HIGH (ref 14.0–49.7)
MONO%: 3.1 % (ref 0.0–14.0)
Platelets: 269 10*3/uL (ref 145–400)
RBC: 4.15 10*6/uL (ref 3.70–5.45)
RDW: 13.9 % (ref 11.2–14.5)

## 2011-01-06 LAB — CANCER ANTIGEN 27.29: CA 27.29: 48 U/mL — ABNORMAL HIGH (ref 0–39)

## 2011-01-06 LAB — COMPREHENSIVE METABOLIC PANEL
Albumin: 4.2 g/dL (ref 3.5–5.2)
Alkaline Phosphatase: 70 U/L (ref 39–117)
BUN: 10 mg/dL (ref 6–23)
CO2: 24 mEq/L (ref 19–32)
Calcium: 9.4 mg/dL (ref 8.4–10.5)
Chloride: 104 mEq/L (ref 96–112)
Creatinine, Ser: 0.76 mg/dL (ref 0.40–1.20)
Glucose, Bld: 85 mg/dL (ref 70–99)
Sodium: 136 mEq/L (ref 135–145)
Total Protein: 7.6 g/dL (ref 6.0–8.3)

## 2011-01-06 LAB — LACTATE DEHYDROGENASE: LDH: 170 U/L (ref 94–250)

## 2011-01-13 ENCOUNTER — Telehealth: Payer: Self-pay | Admitting: Internal Medicine

## 2011-01-13 ENCOUNTER — Encounter: Payer: Self-pay | Admitting: Internal Medicine

## 2011-01-17 ENCOUNTER — Other Ambulatory Visit: Payer: Self-pay | Admitting: Internal Medicine

## 2011-01-17 ENCOUNTER — Encounter: Payer: Self-pay | Admitting: Internal Medicine

## 2011-01-17 ENCOUNTER — Other Ambulatory Visit: Payer: Medicare Other

## 2011-01-17 ENCOUNTER — Ambulatory Visit (INDEPENDENT_AMBULATORY_CARE_PROVIDER_SITE_OTHER): Payer: Medicare Other | Admitting: Internal Medicine

## 2011-01-17 DIAGNOSIS — E890 Postprocedural hypothyroidism: Secondary | ICD-10-CM

## 2011-01-17 DIAGNOSIS — M1710 Unilateral primary osteoarthritis, unspecified knee: Secondary | ICD-10-CM

## 2011-01-17 DIAGNOSIS — D6489 Other specified anemias: Secondary | ICD-10-CM

## 2011-01-17 DIAGNOSIS — R197 Diarrhea, unspecified: Secondary | ICD-10-CM

## 2011-01-17 LAB — CBC WITH DIFFERENTIAL/PLATELET
Basophils Relative: 0.3 % (ref 0.0–3.0)
Eosinophils Relative: 2.4 % (ref 0.0–5.0)
Lymphocytes Relative: 55.3 % — ABNORMAL HIGH (ref 12.0–46.0)
MCV: 88.1 fl (ref 78.0–100.0)
Monocytes Absolute: 0.4 10*3/uL (ref 0.1–1.0)
Neutrophils Relative %: 37 % — ABNORMAL LOW (ref 43.0–77.0)
RBC: 4.44 Mil/uL (ref 3.87–5.11)
WBC: 10.4 10*3/uL (ref 4.5–10.5)

## 2011-01-17 LAB — HEPATIC FUNCTION PANEL
ALT: 18 U/L (ref 0–35)
Alkaline Phosphatase: 75 U/L (ref 39–117)
Bilirubin, Direct: 0.1 mg/dL (ref 0.0–0.3)
Total Protein: 7.9 g/dL (ref 6.0–8.3)

## 2011-01-17 LAB — BASIC METABOLIC PANEL
Chloride: 102 mEq/L (ref 96–112)
Creatinine, Ser: 0.7 mg/dL (ref 0.4–1.2)
Sodium: 137 mEq/L (ref 135–145)

## 2011-01-20 ENCOUNTER — Other Ambulatory Visit: Payer: Self-pay | Admitting: Internal Medicine

## 2011-01-21 ENCOUNTER — Other Ambulatory Visit (HOSPITAL_COMMUNITY): Payer: Self-pay

## 2011-01-21 ENCOUNTER — Ambulatory Visit (HOSPITAL_COMMUNITY)
Admission: RE | Admit: 2011-01-21 | Discharge: 2011-01-21 | Disposition: A | Discharge status: Home / Self Care | Payer: Medicare Other | Source: Ambulatory Visit | Attending: Oncology | Admitting: Oncology

## 2011-01-21 ENCOUNTER — Encounter (HOSPITAL_COMMUNITY)
Admission: RE | Admit: 2011-01-21 | Discharge: 2011-01-21 | Disposition: A | Discharge status: Home / Self Care | Payer: Medicare Other | Source: Ambulatory Visit | Attending: Oncology | Admitting: Oncology

## 2011-01-21 ENCOUNTER — Encounter (HOSPITAL_COMMUNITY): Payer: Self-pay

## 2011-01-21 ENCOUNTER — Ambulatory Visit (HOSPITAL_COMMUNITY): Payer: Self-pay

## 2011-01-21 DIAGNOSIS — C50919 Malignant neoplasm of unspecified site of unspecified female breast: Secondary | ICD-10-CM

## 2011-01-21 DIAGNOSIS — D259 Leiomyoma of uterus, unspecified: Secondary | ICD-10-CM | POA: Insufficient documentation

## 2011-01-21 DIAGNOSIS — C773 Secondary and unspecified malignant neoplasm of axilla and upper limb lymph nodes: Secondary | ICD-10-CM | POA: Insufficient documentation

## 2011-01-21 DIAGNOSIS — M129 Arthropathy, unspecified: Secondary | ICD-10-CM | POA: Insufficient documentation

## 2011-01-21 DIAGNOSIS — R599 Enlarged lymph nodes, unspecified: Secondary | ICD-10-CM | POA: Insufficient documentation

## 2011-01-21 DIAGNOSIS — M2550 Pain in unspecified joint: Secondary | ICD-10-CM | POA: Insufficient documentation

## 2011-01-21 DIAGNOSIS — K8020 Calculus of gallbladder without cholecystitis without obstruction: Secondary | ICD-10-CM | POA: Insufficient documentation

## 2011-01-21 DIAGNOSIS — C7952 Secondary malignant neoplasm of bone marrow: Secondary | ICD-10-CM | POA: Insufficient documentation

## 2011-01-21 DIAGNOSIS — C7951 Secondary malignant neoplasm of bone: Secondary | ICD-10-CM | POA: Insufficient documentation

## 2011-01-21 DIAGNOSIS — Z9010 Acquired absence of unspecified breast and nipple: Secondary | ICD-10-CM | POA: Insufficient documentation

## 2011-01-21 HISTORY — DX: Malignant neoplasm of unspecified site of unspecified female breast: C50.919

## 2011-01-21 MED ORDER — IOHEXOL 300 MG/ML  SOLN
100.0000 mL | Freq: Once | INTRAMUSCULAR | Status: AC | PRN
  Administered 2011-01-21: 100 mL via INTRAVENOUS

## 2011-01-21 MED ORDER — TECHNETIUM TC 99M MEDRONATE IV KIT
23.8000 | PACK | Freq: Once | INTRAVENOUS | Status: AC | PRN
  Administered 2011-01-21: 23.8 via INTRAVENOUS

## 2011-01-21 NOTE — Letter (Signed)
Summary: Generic Letter  [facility]  [address] [place], [place] [address]   Phone: [phone]  Fax: [phone]        01/13/2011  Helen [name] [address] [place], [place]  [address]  Dear Ms. Bump,  To Whom It May Concern:  The above mentioned patient needs assistance with transportation as she is not able to walk to the bus or stand and wait for the bus.         Sincerely,   [name] MD  Appended Document: Generic Letter Letter faxed to [phone]/la

## 2011-01-21 NOTE — Assessment & Plan Note (Signed)
Summary: 2 MTH FU-LB   Vital Signs:  Patient profile:   76 year old female Menstrual status:  postmenopausal Height:      64 inches (162.56 cm) Weight:      142.25 pounds (64.66 kg) BMI:     24.51 O2 Sat:      96 % on Room air Temp:     97.6 degrees F (36.44 degrees C) oral Pulse rate:   55 / minute Pulse rhythm:   regular Resp:     14 per minute BP sitting:   128 / 86 Cuff size:   regular  Vitals Entered By: Burnard Leigh CMA(AAMA) (January 17, 2011 8:18 AM)  O2 Flow:  Room air CC: 2-mth F/U.Pt c/o dizziness, diarrhea, numbness & tingling in hands, feet, and legs/sls, cma, Diarrhea Is Patient Diabetic? No Pain Assessment Patient in pain? no        Primary Care Provider:  [name] MD  CC:  2-mth F/U.Pt c/o dizziness, diarrhea, numbness & tingling in hands, feet, and legs/sls, cma, and Diarrhea.  History of Present Illness:  Diarrhea      This is a 76 year old woman who presents with Diarrhea.  The symptoms began 4 weeks ago.  The severity is described as mild.  The patient reports 3 stools or less per day, semiformed/loose stools, and gradual onset of symptoms, but denies 4-6 stools per day, >6 stools per day, watery/unformed stools, voluminous stools, blood in stool, mucus in stool, greasy stools, malodorous stools, fecal urgency, fecal soiling, alternating diarrhea/constipation, nocturnal diarrhea, fasting diarrhea, bloating, gassiness, and abrupt onset of symptoms.  The patient denies fever, abdominal pain, abdominal cramps, nausea, vomiting, lightheadedness, increased thirst, weight loss, joint pains, mouth ulcers, and eye redness.  The symptoms are worse with any food.  The symptoms are better with fasting.  Patient has a  history of radiation therapy.    Preventive Screening-Counseling & Management  Alcohol-Tobacco     Alcohol drinks/day: 0     Alcohol Counseling: not indicated; patient does not drink     Smoking Status: never     Tobacco Counseling: not  indicated; no tobacco use  Hep-HIV-STD-Contraception     Hepatitis Risk: no risk noted     HIV Risk: no risk noted     STD Risk: no risk noted      Sexual History:  not sexually active.        Drug Use:  never.        Blood Transfusions:  no.    Clinical Review Panels:  Immunizations   Last Flu Vaccine:  Fluvax 3+ (09/16/2010)  Diabetes Management   Creatinine:  0.7 (11/18/2010)   Last Flu Vaccine:  Fluvax 3+ (09/16/2010)  CBC   WBC:  8.6 (11/18/2010)   RBC:  4.21 (11/18/2010)   Hgb:  12.4 (11/18/2010)   Hct:  37.1 (11/18/2010)   Platelets:  233.0 (11/18/2010)   MCV  88.2 (11/18/2010)   MCHC  33.4 (11/18/2010)   RDW  14.4 (11/18/2010)   PMN:  48.1 (11/18/2010)   Lymphs:  41.8 (11/18/2010)   Monos:  6.7 (11/18/2010)   Eosinophils:  3.0 (11/18/2010)   Basophil:  0.4 (11/18/2010)  Complete Metabolic Panel   Glucose:  64 (11/18/2010)   Sodium:  134 (11/18/2010)   Potassium:  4.5 (11/18/2010)   Chloride:  99 (11/18/2010)   CO2:  28 (11/18/2010)   BUN:  14 (11/18/2010)   Creatinine:  0.7 (11/18/2010)   Albumin:  3.8 (11/18/2010)  Total Protein:  7.4 (11/18/2010)   Calcium:  9.5 (11/18/2010)   Total Bili:  0.6 (11/18/2010)   Alk Phos:  71 (11/18/2010)   SGPT (ALT):  11 (11/18/2010)   SGOT (AST):  17 (11/18/2010)   Medications Prior to Update: 1)  Zyrtec Hives Relief 10 Mg Tabs (Cetirizine Hcl) .... Once Daily As Needed For Itching 2)  Triamcinolone Acetonide 0.1 % Crea (Triamcinolone Acetonide) .... Apply To Rash/itching Two Times A Day 3)  Femara 2.5 Mg Tabs (Letrozole) 4)  Celebrex 200 Mg Caps (Celecoxib) .... One By Mouth Once Daily For Knee Pain  Current Medications (verified): 1)  Zyrtec Hives Relief 10 Mg Tabs (Cetirizine Hcl) .... Once Daily As Needed For Itching 2)  Triamcinolone Acetonide 0.1 % Crea (Triamcinolone Acetonide) .... Apply To Rash/itching Two Times A Day 3)  Femara 2.5 Mg Tabs (Letrozole) 4)  Celebrex 200 Mg Caps (Celecoxib) .... One By  Mouth Once Daily For Knee Pain  Allergies (verified): 1)  ! Penicillin 2)  ! Sulfa 3)  ! Tramadol Hcl (Tramadol Hcl)  Past History:  Past Medical History: Last updated: 04/09/2009 Breast cancer, hx of  Past Surgical History: Last updated: 11/18/2010 Mastectomy Thyroidectomy  Family History: Last updated: 04/09/2009 Family History of Alcoholism/Addiction  Social History: Last updated: 04/09/2009 Single Widow/Widower Never Smoked Alcohol use-no Drug use-no Regular exercise-no  Risk Factors: Alcohol Use: 0 (01/17/2011) Exercise: no (04/09/2009)  Risk Factors: Smoking Status: never (01/17/2011)  Family History: Reviewed history from 04/09/2009 and no changes required. Family History of Alcoholism/Addiction  Social History: Reviewed history from 04/09/2009 and no changes required. Single Widow/Widower Never Smoked Alcohol use-no Drug use-no Regular exercise-no  Review of Systems  The patient denies anorexia, fever, weight loss, weight gain, chest pain, syncope, dyspnea on exertion, peripheral edema, prolonged cough, headaches, hemoptysis, abdominal pain, melena, hematochezia, severe indigestion/heartburn, hematuria, muscle weakness, suspicious skin lesions, transient blindness, difficulty walking, depression, angioedema, and breast masses.   Neuro:  Complains of numbness and tingling; denies brief paralysis, difficulty with concentration, disturbances in coordination, falling down, headaches, inability to speak, memory loss, poor balance, seizures, sensation of room spinning, tremors, visual disturbances, and weakness. Endo:  Denies cold intolerance, excessive hunger, excessive thirst, excessive urination, heat intolerance, polyuria, and weight change. Heme:  Denies abnormal bruising, bleeding, enlarge lymph nodes, fevers, pallor, and skin discoloration.  Physical Exam  General:  alert, well-developed, well-nourished, well-hydrated, appropriate dress, normal  appearance, healthy-appearing, cooperative to examination, and good hygiene.   Head:  normocephalic, atraumatic, no abnormalities observed, no abnormalities palpated, and no alopecia.   Eyes:  vision grossly intact, pupils equal, pupils round, pupils reactive to light, and no injection.   Mouth:  good dentition, pharynx pink and moist, no erythema, no exudates, no posterior lymphoid hypertrophy, no postnasal drip, no pharyngeal crowing, no lesions, no tongue abnormalities, no leukoplakia, and no petechiae.   Neck:  thyroid scar. supple, no masses, no thyromegaly, no thyroid nodules or tenderness, no JVD, normal carotid upstroke, no cervical lymphadenopathy, and no neck tenderness.   Lungs:  normal respiratory effort, no intercostal retractions, no accessory muscle use, normal breath sounds, no dullness, no fremitus, no crackles, and no wheezes.   Heart:  normal rate, regular rhythm, no murmur, no gallop, no rub, and no JVD.   Abdomen:  soft, non-tender, normal bowel sounds, no distention, no masses, no guarding, no rigidity, no rebound tenderness, no abdominal hernia, no inguinal hernia, no hepatomegaly, and no splenomegaly.   Msk:  normal ROM, no joint tenderness,  no joint swelling, no joint warmth, no redness over joints, no joint deformities, no joint instability, and no crepitation.   Pulses:  R and L carotid,radial,femoral,dorsalis pedis and posterior tibial pulses are full and equal bilaterally Extremities:  No clubbing, cyanosis, edema, or deformity noted with normal full range of motion of all joints.   Neurologic:  No cranial nerve deficits noted. Station and gait are normal. Plantar reflexes are down-going bilaterally. DTRs are symmetrical throughout. Sensory, motor and coordinative functions appear intact. Skin:  turgor normal, color normal, no rashes, no suspicious lesions, no ecchymoses, no petechiae, no purpura, no ulcerations, and no edema.   Cervical Nodes:  no anterior cervical  adenopathy and no posterior cervical adenopathy.   Axillary Nodes:  no R axillary adenopathy and no L axillary adenopathy.   Inguinal Nodes:  no R inguinal adenopathy and no L inguinal adenopathy.   Psych:  Cognition and judgment appear intact. Alert and cooperative with normal attention span and concentration. No apparent delusions, illusions, hallucinations   Impression & Recommendations:  Problem # 1:  DIARRHEA (ICD-787.91) Assessment New will check the stool for infection and look at blood work for organic causes Orders: T-C diff by PCR (04540) T-Stool for O&P (98119-14782) TLB-BMP (Basic Metabolic Panel-BMET) (80048-METABOL) TLB-CBC Platelet - w/Differential (85025-CBCD) TLB-Hepatic/Liver Function Pnl (80076-HEPATIC) TLB-TSH (Thyroid Stimulating Hormone) (84443-TSH) TLB-CRP-High Sensitivity (C-Reactive Protein) (86140-FCRP) TLB-Sedimentation Rate (ESR) (85652-ESR)  Problem # 2:  POSTSURGICAL HYPOTHYROIDISM (ICD-244.0) Assessment: Unchanged  Orders: TLB-BMP (Basic Metabolic Panel-BMET) (80048-METABOL) TLB-CBC Platelet - w/Differential (85025-CBCD) TLB-Hepatic/Liver Function Pnl (80076-HEPATIC) TLB-TSH (Thyroid Stimulating Hormone) (84443-TSH) TLB-CRP-High Sensitivity (C-Reactive Protein) (86140-FCRP) TLB-Sedimentation Rate (ESR) (85652-ESR)  Labs Reviewed: TSH: 2.41 (11/18/2010)     Problem # 3:  OTHER SPECIFIED ANEMIAS (ICD-285.8) Assessment: Unchanged  Orders: TLB-BMP (Basic Metabolic Panel-BMET) (80048-METABOL) TLB-CBC Platelet - w/Differential (85025-CBCD) TLB-Hepatic/Liver Function Pnl (80076-HEPATIC) TLB-TSH (Thyroid Stimulating Hormone) (84443-TSH) TLB-CRP-High Sensitivity (C-Reactive Protein) (86140-FCRP) TLB-Sedimentation Rate (ESR) (85652-ESR) TLB-B12 + Folate Pnl (95621_30865-H84/ONG)  Hgb: 12.4 (11/18/2010)   Hct: 37.1 (11/18/2010)   Platelets: 233.0 (11/18/2010) RBC: 4.21 (11/18/2010)   RDW: 14.4 (11/18/2010)   WBC: 8.6 (11/18/2010) MCV: 88.2  (11/18/2010)   MCHC: 33.4 (11/18/2010) Iron: 55 (11/18/2010)   % Sat: 16.5 (11/18/2010) B12: 318 (11/18/2010)   Folate: 15.0 (11/18/2010)   TSH: 2.41 (11/18/2010)  Problem # 4:  DEGENERATIVE JOINT DISEASE, BOTH KNEES, SEVERE (ICD-715.96) Assessment: Unchanged  Her updated medication list for this problem includes:    Celebrex 200 Mg Caps (Celecoxib) ..... One by mouth once daily for knee pain  Orders: TLB-BMP (Basic Metabolic Panel-BMET) (80048-METABOL) TLB-CBC Platelet - w/Differential (85025-CBCD) TLB-Hepatic/Liver Function Pnl (80076-HEPATIC) TLB-TSH (Thyroid Stimulating Hormone) (84443-TSH) TLB-CRP-High Sensitivity (C-Reactive Protein) (86140-FCRP) TLB-Sedimentation Rate (ESR) (85652-ESR)  Discussed strengthening exercises, use of ice or heat, and medications.   Complete Medication List: 1)  Zyrtec Hives Relief 10 Mg Tabs (Cetirizine hcl) .... Once daily as needed for itching 2)  Triamcinolone Acetonide 0.1 % Crea (Triamcinolone acetonide) .... Apply to rash/itching two times a day 3)  Femara 2.5 Mg Tabs (Letrozole) 4)  Celebrex 200 Mg Caps (Celecoxib) .... One by mouth once daily for knee pain  Patient Instructions: 1)  Please schedule a follow-up appointment in 3 months. 2)  It is important that you exercise regularly at least 20 minutes 5 times a week. If you develop chest pain, have severe difficulty breathing, or feel very tired , stop exercising immediately and seek medical attention. 3)  You need to lose weight. Consider a lower calorie diet and regular  exercise.  4)  teh main problem with gastroenteritis is dehydration. Drink plenty of fluids and take solids as you feel better. If you are unable to keep anything down and/or you show signs of dehydration(dry/cracked lips, lack of tears, not urinating, very sleepy), call our office.   Orders Added: 1)  T-C diff by PCR [81755] 2)  T-Stool for O&P [87177-70555] 3)  TLB-BMP (Basic Metabolic Panel-BMET) [80048-METABOL] 4)   TLB-CBC Platelet - w/Differential [85025-CBCD] 5)  TLB-Hepatic/Liver Function Pnl [80076-HEPATIC] 6)  TLB-TSH (Thyroid Stimulating Hormone) [84443-TSH] 7)  TLB-CRP-High Sensitivity (C-Reactive Protein) [86140-FCRP] 8)  TLB-Sedimentation Rate (ESR) [85652-ESR] 9)  TLB-B12 + Folate Pnl [82746_82607-B12/FOL] 10)  Est. Patient Level IV [40981]

## 2011-01-21 NOTE — Progress Notes (Signed)
Summary: patient need letter for transportation by Friday.  Phone Note Call from Patient Call back at Home Phone [phone]   Caller: Patient Call For: [name] MD Summary of Call: Patient called stating that in order to receive transportation for her next appt Friday, January 17, 2011, she needs a letter faxed to [employer] [facility] [employer] stating she is medically unable to walk to a bus stop and wait for the bus. Patient stated she  need transportaion to pick her up from her home. Phone number for transportation is [phone]. Please Advise. Initial call taken by: Daphane Shepherd,  January 13, 2011 1:55 PM  Follow-up for Phone Call        Called transportation # several times and hold time was 20 min or longer. Patient notified of this and ask her for any addtional info. Patient did not have any so letter will be mailed out to her.Alvy Beal Archie CMA  January 13, 2011 3:58 PM

## 2011-01-22 LAB — CLOSTRIDIUM DIFFICILE BY PCR: Toxigenic C. Difficile by PCR: NOT DETECTED

## 2011-01-22 LAB — OVA AND PARASITE SCREEN
OP: NONE SEEN
OP: NOT DETECTED

## 2011-01-28 ENCOUNTER — Encounter (HOSPITAL_BASED_OUTPATIENT_CLINIC_OR_DEPARTMENT_OTHER): Payer: Medicare Other | Admitting: Oncology

## 2011-01-28 ENCOUNTER — Other Ambulatory Visit: Payer: Self-pay | Admitting: Oncology

## 2011-01-28 DIAGNOSIS — C50919 Malignant neoplasm of unspecified site of unspecified female breast: Secondary | ICD-10-CM

## 2011-01-28 DIAGNOSIS — C801 Malignant (primary) neoplasm, unspecified: Secondary | ICD-10-CM

## 2011-01-28 DIAGNOSIS — C50419 Malignant neoplasm of upper-outer quadrant of unspecified female breast: Secondary | ICD-10-CM

## 2011-01-28 DIAGNOSIS — C7800 Secondary malignant neoplasm of unspecified lung: Secondary | ICD-10-CM

## 2011-01-28 DIAGNOSIS — C7952 Secondary malignant neoplasm of bone marrow: Secondary | ICD-10-CM

## 2011-02-03 ENCOUNTER — Ambulatory Visit (HOSPITAL_BASED_OUTPATIENT_CLINIC_OR_DEPARTMENT_OTHER)
Admission: RE | Admit: 2011-02-03 | Discharge: 2011-02-03 | Disposition: A | Discharge status: Home / Self Care | Payer: Medicare Other | Source: Ambulatory Visit | Attending: General Surgery | Admitting: General Surgery

## 2011-02-03 ENCOUNTER — Other Ambulatory Visit: Payer: Self-pay | Admitting: General Surgery

## 2011-02-03 DIAGNOSIS — Z01810 Encounter for preprocedural cardiovascular examination: Secondary | ICD-10-CM | POA: Insufficient documentation

## 2011-02-03 DIAGNOSIS — C9110 Chronic lymphocytic leukemia of B-cell type not having achieved remission: Secondary | ICD-10-CM | POA: Insufficient documentation

## 2011-02-03 DIAGNOSIS — Z01812 Encounter for preprocedural laboratory examination: Secondary | ICD-10-CM | POA: Insufficient documentation

## 2011-02-03 DIAGNOSIS — C8584 Other specified types of non-Hodgkin lymphoma, lymph nodes of axilla and upper limb: Secondary | ICD-10-CM | POA: Insufficient documentation

## 2011-02-03 LAB — POCT HEMOGLOBIN-HEMACUE: Hemoglobin: 12.8 g/dL (ref 12.0–15.0)

## 2011-02-05 LAB — GLUCOSE, CAPILLARY: Glucose-Capillary: 110 mg/dL — ABNORMAL HIGH (ref 70–99)

## 2011-02-07 ENCOUNTER — Encounter (HOSPITAL_COMMUNITY): Payer: Self-pay

## 2011-02-07 ENCOUNTER — Encounter (HOSPITAL_COMMUNITY)
Admission: RE | Admit: 2011-02-07 | Discharge: 2011-02-07 | Disposition: A | Discharge status: Home / Self Care | Payer: Medicare Other | Source: Ambulatory Visit | Attending: Oncology | Admitting: Oncology

## 2011-02-07 DIAGNOSIS — C50919 Malignant neoplasm of unspecified site of unspecified female breast: Secondary | ICD-10-CM | POA: Insufficient documentation

## 2011-02-07 LAB — GLUCOSE, CAPILLARY: Glucose-Capillary: 100 mg/dL — ABNORMAL HIGH (ref 70–99)

## 2011-02-07 MED ORDER — FLUDEOXYGLUCOSE F - 18 (FDG) INJECTION
15.3000 | Freq: Once | INTRAVENOUS | Status: AC | PRN
  Administered 2011-02-07: 15.3 via INTRAVENOUS

## 2011-02-11 LAB — URINALYSIS, ROUTINE W REFLEX MICROSCOPIC
Bilirubin Urine: NEGATIVE
Hgb urine dipstick: NEGATIVE
Ketones, ur: NEGATIVE mg/dL
Nitrite: NEGATIVE
Urobilinogen, UA: 0.2 mg/dL (ref 0.0–1.0)

## 2011-02-11 NOTE — Op Note (Signed)
  NAMEMarland Kitchen  Helen [name], Helen [name] NO.:  [id number]  MEDICAL RECORD NO.:  [id number]           PATIENT TYPE:  LOCATION:                                 FACILITY:  PHYSICIAN:  Sharlet Salina T. Briany Aye, M.D.DATE OF BIRTH:  May 18, 1935  DATE OF PROCEDURE:  02/03/2011 DATE OF DISCHARGE:                              OPERATIVE REPORT   PREOPERATIVE DIAGNOSIS:  Lymphadenopathy.  POSTOPERATIVE DIAGNOSIS:  Lymphadenopathy.  SURGICAL PROCEDURE:  Deep right axillary lymph node biopsy.  SURGEON:  Lorne Skeens. Jamyia Fortune, MD.  ANESTHESIA:  LMA general.  BRIEF HISTORY:  [name] is a 76 year old female with known metastatic breast cancer with fairly minimal and stable disease on femora.  She is followed by Dr. Welton Flakes.  Recent scan had shown new significant lymphadenopathy particularly in the right axilla as well as the abdomen and pelvis with the patient having had a history of left breast cancer and mastectomy.  She does have some palpable lymphadenopathy in the right axilla.  Due to the concern about a potential new malignancy, we have recommended proceeding with excisional biopsy of one of the right axillary lymph nodes for diagnosis.  The nature of procedure, its indications, risks of bleeding, infection, seroma, and lymphedema were discussed and understood.  She was brought to the operating room for this procedure.  DESCRIPTION OF OPERATION:  The patient was brought to the operating room, placed in supine position on the operating table.  Laryngeal mask general anesthesia was induced.  The right axilla was widely sterilely prepped and draped.  She received preoperative antibiotics.  Correct patient and procedure were verified.  I made a transverse incision on the skin crease in the right axilla and using cautery dissection was carried down through the subcutaneous tissue and clavipectoral fascia was incised.  I was then able to palpate several soft, but large lymph nodes in  the axilla.  Using careful blunt dissection, these were isolated, and they were not particularly avascular, although definitely enlarged and somewhat friable.  I removed several of the largest lymph nodes from the deep axilla using cautery.  These were sent fresh for lymph node workup.  The soft tissue was inflated with Marcaine with epinephrine.  Hemostasis was assured.  The subcu was closed with running 3-0 Vicryl.  Skin was closed with subcuticular 5-0 Monocryl and Dermabond.  Sponge, needle, instrument counts correct.  The patient was taken to recovery in good condition.     Lorne Skeens. Demetrie Borge, M.D.     Tory Emerald  D:  02/03/2011  T:  02/04/2011  Job:  [id number]  cc:   Drue Second, M.D.  Electronically Signed by Glenna Fellows M.D. on 02/11/2011 11:27:28 AM

## 2011-02-12 LAB — CROSSMATCH

## 2011-02-26 ENCOUNTER — Encounter (HOSPITAL_BASED_OUTPATIENT_CLINIC_OR_DEPARTMENT_OTHER): Payer: Medicare Other | Admitting: Oncology

## 2011-02-26 ENCOUNTER — Other Ambulatory Visit: Payer: Self-pay | Admitting: Oncology

## 2011-02-26 DIAGNOSIS — C7952 Secondary malignant neoplasm of bone marrow: Secondary | ICD-10-CM

## 2011-02-26 DIAGNOSIS — C801 Malignant (primary) neoplasm, unspecified: Secondary | ICD-10-CM

## 2011-02-26 DIAGNOSIS — C50419 Malignant neoplasm of upper-outer quadrant of unspecified female breast: Secondary | ICD-10-CM

## 2011-02-26 DIAGNOSIS — C9110 Chronic lymphocytic leukemia of B-cell type not having achieved remission: Secondary | ICD-10-CM

## 2011-02-26 DIAGNOSIS — C50919 Malignant neoplasm of unspecified site of unspecified female breast: Secondary | ICD-10-CM

## 2011-02-26 DIAGNOSIS — C8589 Other specified types of non-Hodgkin lymphoma, extranodal and solid organ sites: Secondary | ICD-10-CM

## 2011-02-26 DIAGNOSIS — C7800 Secondary malignant neoplasm of unspecified lung: Secondary | ICD-10-CM

## 2011-02-26 LAB — CBC WITH DIFFERENTIAL/PLATELET
BASO%: 0.2 % (ref 0.0–2.0)
Basophils Absolute: 0 10*3/uL (ref 0.0–0.1)
HCT: 37.2 % (ref 34.8–46.6)
HGB: 12.3 g/dL (ref 11.6–15.9)
LYMPH%: 56 % — ABNORMAL HIGH (ref 14.0–49.7)
MCH: 29 pg (ref 25.1–34.0)
MCHC: 33 g/dL (ref 31.5–36.0)
MONO#: 0.4 10*3/uL (ref 0.1–0.9)
NEUT%: 38.1 % — ABNORMAL LOW (ref 38.4–76.8)
Platelets: 268 10*3/uL (ref 145–400)
WBC: 11.4 10*3/uL — ABNORMAL HIGH (ref 3.9–10.3)

## 2011-02-27 LAB — BETA 2 MICROGLOBULIN, SERUM: Beta-2 Microglobulin: 1.96 mg/L — ABNORMAL HIGH (ref 1.01–1.73)

## 2011-02-28 ENCOUNTER — Other Ambulatory Visit: Payer: Self-pay | Admitting: Oncology

## 2011-02-28 DIAGNOSIS — C9110 Chronic lymphocytic leukemia of B-cell type not having achieved remission: Secondary | ICD-10-CM

## 2011-03-11 ENCOUNTER — Ambulatory Visit (HOSPITAL_COMMUNITY): Payer: Medicare Other | Attending: Oncology

## 2011-03-11 ENCOUNTER — Other Ambulatory Visit: Payer: Self-pay | Admitting: Oncology

## 2011-03-11 ENCOUNTER — Encounter (HOSPITAL_COMMUNITY): Payer: Self-pay

## 2011-03-11 ENCOUNTER — Ambulatory Visit (HOSPITAL_COMMUNITY)
Admission: RE | Admit: 2011-03-11 | Discharge: 2011-03-11 | Disposition: A | Discharge status: Home / Self Care | Payer: Medicare Other | Source: Ambulatory Visit | Attending: Oncology | Admitting: Oncology

## 2011-03-11 ENCOUNTER — Other Ambulatory Visit: Payer: Self-pay | Admitting: Interventional Radiology

## 2011-03-11 DIAGNOSIS — M899 Disorder of bone, unspecified: Secondary | ICD-10-CM | POA: Insufficient documentation

## 2011-03-11 DIAGNOSIS — Z79899 Other long term (current) drug therapy: Secondary | ICD-10-CM | POA: Insufficient documentation

## 2011-03-11 DIAGNOSIS — C8599 Non-Hodgkin lymphoma, unspecified, extranodal and solid organ sites: Secondary | ICD-10-CM | POA: Insufficient documentation

## 2011-03-11 DIAGNOSIS — C9110 Chronic lymphocytic leukemia of B-cell type not having achieved remission: Secondary | ICD-10-CM | POA: Insufficient documentation

## 2011-03-11 DIAGNOSIS — M949 Disorder of cartilage, unspecified: Secondary | ICD-10-CM | POA: Insufficient documentation

## 2011-03-11 LAB — CBC
HCT: 37 % (ref 36.0–46.0)
Hemoglobin: 12.1 g/dL (ref 12.0–15.0)
MCV: 87.3 fL (ref 78.0–100.0)
RDW: 13.7 % (ref 11.5–15.5)
WBC: 10.8 10*3/uL — ABNORMAL HIGH (ref 4.0–10.5)

## 2011-03-11 LAB — PROTIME-INR: INR: 0.97 (ref 0.00–1.49)

## 2011-03-11 LAB — APTT: aPTT: 28 seconds (ref 24–37)

## 2011-03-12 ENCOUNTER — Encounter (HOSPITAL_BASED_OUTPATIENT_CLINIC_OR_DEPARTMENT_OTHER): Payer: Medicare Other | Admitting: Oncology

## 2011-03-12 ENCOUNTER — Other Ambulatory Visit: Payer: Self-pay | Admitting: Oncology

## 2011-03-12 ENCOUNTER — Other Ambulatory Visit (HOSPITAL_COMMUNITY)
Admission: RE | Admit: 2011-03-12 | Discharge: 2011-03-12 | Disposition: A | Discharge status: Home / Self Care | Payer: Medicare Other | Source: Ambulatory Visit | Attending: Oncology | Admitting: Oncology

## 2011-03-12 DIAGNOSIS — C50419 Malignant neoplasm of upper-outer quadrant of unspecified female breast: Secondary | ICD-10-CM

## 2011-03-12 DIAGNOSIS — C7952 Secondary malignant neoplasm of bone marrow: Secondary | ICD-10-CM

## 2011-03-12 DIAGNOSIS — C7800 Secondary malignant neoplasm of unspecified lung: Secondary | ICD-10-CM

## 2011-03-12 DIAGNOSIS — C801 Malignant (primary) neoplasm, unspecified: Secondary | ICD-10-CM

## 2011-03-12 DIAGNOSIS — C9110 Chronic lymphocytic leukemia of B-cell type not having achieved remission: Secondary | ICD-10-CM | POA: Insufficient documentation

## 2011-03-12 LAB — CBC WITH DIFFERENTIAL/PLATELET
Basophils Absolute: 0 10*3/uL (ref 0.0–0.1)
EOS%: 1.7 % (ref 0.0–7.0)
Eosinophils Absolute: 0.2 10*3/uL (ref 0.0–0.5)
HCT: 36.9 % (ref 34.8–46.6)
HGB: 12.3 g/dL (ref 11.6–15.9)
MONO#: 0.2 10*3/uL (ref 0.1–0.9)
NEUT#: 3.6 10*3/uL (ref 1.5–6.5)
NEUT%: 33 % — ABNORMAL LOW (ref 38.4–76.8)
RDW: 13.6 % (ref 11.2–14.5)
WBC: 10.9 10*3/uL — ABNORMAL HIGH (ref 3.9–10.3)
lymph#: 6.9 10*3/uL — ABNORMAL HIGH (ref 0.9–3.3)

## 2011-03-13 LAB — IGG, IGA, IGM
IgA: 120 mg/dL (ref 68–378)
IgG (Immunoglobin G), Serum: 2040 mg/dL — ABNORMAL HIGH (ref 700–1600)

## 2011-03-13 LAB — COMPREHENSIVE METABOLIC PANEL
AST: 16 U/L (ref 0–37)
Albumin: 4.3 g/dL (ref 3.5–5.2)
Alkaline Phosphatase: 74 U/L (ref 39–117)
BUN: 14 mg/dL (ref 6–23)
Calcium: 9.6 mg/dL (ref 8.4–10.5)
Creatinine, Ser: 0.79 mg/dL (ref 0.40–1.20)
Glucose, Bld: 113 mg/dL — ABNORMAL HIGH (ref 70–99)
Potassium: 3.7 mEq/L (ref 3.5–5.3)

## 2011-03-13 LAB — BETA 2 MICROGLOBULIN, SERUM: Beta-2 Microglobulin: 1.66 mg/L (ref 1.01–1.73)

## 2011-03-18 ENCOUNTER — Encounter (HOSPITAL_BASED_OUTPATIENT_CLINIC_OR_DEPARTMENT_OTHER): Payer: Medicare Other | Admitting: Oncology

## 2011-03-18 ENCOUNTER — Other Ambulatory Visit: Payer: Self-pay | Admitting: Oncology

## 2011-03-18 DIAGNOSIS — C7952 Secondary malignant neoplasm of bone marrow: Secondary | ICD-10-CM

## 2011-03-18 LAB — CBC WITH DIFFERENTIAL/PLATELET
BASO%: 0.2 % (ref 0.0–2.0)
LYMPH%: 57.7 % — ABNORMAL HIGH (ref 14.0–49.7)
MCHC: 33.3 g/dL (ref 31.5–36.0)
MCV: 87.4 fL (ref 79.5–101.0)
MONO%: 3.2 % (ref 0.0–14.0)
Platelets: 252 10*3/uL (ref 145–400)
RBC: 4.21 10*6/uL (ref 3.70–5.45)
RDW: 14.1 % (ref 11.2–14.5)
WBC: 11.4 10*3/uL — ABNORMAL HIGH (ref 3.9–10.3)

## 2011-03-18 LAB — BASIC METABOLIC PANEL
BUN: 13 mg/dL (ref 6–23)
CO2: 21 mEq/L (ref 19–32)
Calcium: 9.9 mg/dL (ref 8.4–10.5)
Glucose, Bld: 106 mg/dL — ABNORMAL HIGH (ref 70–99)

## 2011-03-18 LAB — URIC ACID: Uric Acid, Serum: 4.1 mg/dL (ref 2.4–7.0)

## 2011-03-18 NOTE — H&P (Signed)
Helen [name], [name] NO.:  [id number]   MEDICAL RECORD NO.:  [id number]          PATIENT TYPE:  INP   LOCATION:  0105                         FACILITY:  [facility]   PHYSICIAN:  Sabino Donovan, MD        DATE OF BIRTH:  Jan 31, 1935   DATE OF ADMISSION:  08/29/2008  DATE OF DISCHARGE:                              HISTORY & PHYSICAL   CHIEF COMPLAINT:  Bleeding from left mass.   HISTORY OF PRESENT ILLNESS:  The patient is a 76 year old African  American female with a history of hyperthyroidism who presented with  complaint of bleeding from left breast mass but very poor historian.  She first noticed the breast mass about one year ago and has not gotten  it evaluated by a physician secondary to embarrassment and also due to  fear of finding out the bad news.  She does report some occasional pain  and feels that her left breast mass is infected.  She reports that she  will occasionally get discharge, has been using triple  antibiotic  ointment on it for the last one year.  However, today she started  bleeding profusely from the breast mass and this prompted her to call  [phone].  Otherwise she denies any current pain.  No fever, chills, nausea,  or vomiting.  She does report a weight loss of about 10 pounds over the  last 2-3 months.  She is a poor historian but tells me that she has not  noticed any of the lumps or bumps, denies any muscle pain or joint pain  and denies any respiratory symptoms including cough, shortness of  breath, hematemesis.   PAST MEDICAL HISTORY:  Hypothyroidism, status post surgery.  She is not  on any thyroid replacement.   PAST SURGICAL HISTORY:  1. Appendectomy.  2. Lump removed from both breasts at the age of 89 or 3 which was      thought to be benign.  3. Thyroid surgery.   FAMILY HISTORY:  Noncontributory.   SOCIAL HISTORY:  Negative x3.   ALLERGIES:  She is allergic to CODEINE/PENICILLIN and gets seizure.   MEDICATIONS:  She takes  multivitamin.   REVIEW OF SYSTEMS:  As noted above, otherwise unremarkable.   PHYSICAL EXAMINATION:  VITAL SIGNS:  Temperature 98.9, pulse 91,  respiratory rate 18, blood pressure 108/68, saturating 98% on room air.  HEENT:  No discharge.  PERRLA, EOMI./  NECK:  No lymphadenopathy, thyromegaly.  No JVD.  CHEST:  Clear to auscultation bilaterally.  BREASTS:  On her left breast is a large 6 x 6 cm fungating mass.  No  active bleeding noted.  No erythema noted in this tissue.  Mass is  hardened.  HEART:  Regular rate and rhythm.  No murmurs or gallops.  ABDOMEN:  Soft, nontender, nondistended.  Normal active bowel sounds.  EXTREMITIES:  No clubbing, cyanosis, or edema.   LABORATORY DATA:  Sodium 136, potassium 3.2, BUN 12, creatinine 0.9.  White count is 13.1, H and H is 6 and 19.2, platelets 426.  AST 27, ALT  17.  Urinalysis showed  small leukocyte esterase.   Chest x-ray showed left breast soft tissue mass and mild diffuse  interstitial disease.   IMPRESSION:  1. The patient is a 75 year old African American female with a likely      left breast carcinoma admitted for anemia, likely secondary to      acute on chronic anemia.  At this time we will transfuse with three      units of packed red blood cells and check iron studies and will      follow.  2. Left breast mass, likely malignant in nature.  Will obtain oncology      consult.  In the ER the patient already spoke with Dr. Truett Perna      from oncology about evaluation and biopsy.  3. Prophylaxis with SCDs and PPI.      Sabino Donovan, MD  Electronically Signed     MJ/MEDQ  D:  08/29/2008  T:  08/29/2008  Job:  [id number]

## 2011-03-18 NOTE — Op Note (Signed)
NAMEMarland Kitchen  Helen, [name] NO.:  [id number]   MEDICAL RECORD NO.:  [id number]          PATIENT TYPE:  OIB   LOCATION:  0098                         [facility]ACILITY[facility]   PHYSICIAN:  Lorne Skeens. Hoxworth, M.D.DATE OF BIRTH:  [birth year]   DATE OF PROCEDURE:  03/21/2009  DATE OF DISCHARGE:                               OPERATIVE REPORT   PREOPERATIVE DIAGNOSIS:  Carcinoma left breast.   POSTOPERATIVE DIAGNOSIS:  Carcinoma left breast.   SURGICAL PROCEDURES:  Left total mastectomy.   SURGEON:  Dr. Johna Sheriff.   ANESTHESIA:  General.   HISTORY:  Helen [name] is a 76 year old female who presented with a  locally very advanced fixed ulcerated cancer of the left breast and  evidence of distant metastasis as well.  She has undergone initial  treatment with radiation to the breast wall with hormonal therapy and  has had a very significant reduction in the left breast mass, although  continues to have an ulcerated wound and deformed breast.  However, on  exam now, the mass is mobile with healthy skin above and below that  would allow a toilet mastectomy.  After discussion with the patient and  her family, we have elected proceed with left total mastectomy.  Nature  of procedure, indications, anesthetic risks, risks of bleeding,  infection, wound healing problems were discussed and understood.  She is  now brought to operating room for this procedure.   DESCRIPTION OF OPERATION:  The patient was brought to the operating room  and placed in supine position on the operating table, and general  orotracheal anesthesia was induced.  The left arm was positioned  carefully on an arm board, and the entire left chest, breast and upper  arm widely sterilely prepped and draped.  She was given preoperative IV  antibiotics.  PAS were in place.  Correct patient and procedure were  verified.  A elliptical incision was planned encompassing the nipple-  areolar complex, and the area of mass  and ulceration laterally in the  left breast back to healthy normal appearing skin.  Incision was made,  and dissection was carried down to subcutaneous tissue.  Skin and  subcutaneous flaps were then raised superiorly toward the clavicle,  inferiorly to the inframammary crease, medially to the sternal edge and  laterally out to the anterior border of latissimus dorsi.  The breast  was then dissected up off of the pectoralis major with cautery and free  from the lateral aspect of the pectoralis major and from the serratus as  well.  Attachments were freed up along the anterior border of the  trapezius up toward the axilla.  In the area of the axilla, there was  firm tissue either scar or very likely tumor that was adherent.  Up  toward the axillary skin and up into the axilla was very firm all the  way up to the vessels.  This tissue was dissected off the skin and then  mobilized up toward the low axilla, and we came across the tissue here  with cautery.  The wound was irrigated, and complete hemostasis assured.  The  subcutaneous tissue was then reapproximated with interrupted 3-0  Vicryl.  A  closed suction drain was brought out through separate stab wound and  left under both flaps.  The skin was then closed with staples.  Sponge,  needle and instrument counts were correct.  The patient was taken to  recovery in good condition.      Lorne Skeens. Hoxworth, M.D.  Electronically Signed     BTH/MEDQ  D:  03/21/2009  T:  03/21/2009  Job:  [id number]   cc:   Drue Second, MD  Fax: [phone]

## 2011-03-18 NOTE — Consult Note (Signed)
NAMEONEITA, ALLMON NO.:  [id number]   MEDICAL RECORD NO.:  [id number]          PATIENT TYPE:  INP   LOCATION:  1305                         FACILITY:  [facility]   PHYSICIAN:  Drue Second, MD       DATE OF BIRTH:  1934/12/11   DATE OF CONSULTATION:  08/30/2008  DATE OF DISCHARGE:                                 CONSULTATION   CONSULTING PHYSICIAN:  Drue Second, MD.   REFERRING PHYSICIAN:  [facility].   REASON FOR CONSULTATION:  Fungating breast mass.   HISTORY OF PRESENT ILLNESS:  Helen [name] is a pleasant 76 year old  woman we are asked to see for a fungating breast mass.  She was admitted  on August 29, 2008, due to a left breast bleeding lesion, not evaluated  as an outpatient priorly, due to the patient's hesitancy to be examined  due to embarrassment and afraid of what the physicians would find.  The  mass had been present for about 1 year but once the lesion began to  bleed profusely she called 9-1-1 and was admitted for further  observation.  The patient was seen by surgery today and a biopsy was  ordered.  She had CT scans of the chest, abdomen, pelvis and bone scan  for staging purposes but all these results are currently pending.  We  were asked to see her, anticipating the likely malignancy.   PAST MEDICAL HISTORY:  1. Hypothyroidism.  The patient was not on thyroid medications.  2. Anemia diagnosed during this admission, likely secondary to acute      on chronic.  Of note, the patient states she was taking iron in the      past.   PAST SURGICAL HISTORY:  Status post thyroidectomy 1965, status post  lumpectomy in both breasts at age 44, per patient report, benign, status  post appendectomy.   ALLERGIES:  CODEINE, PENICILLIN.   ADMISSION MEDICATIONS:  Of note, she was on no medications prior to  admission.   CURRENT MEDICATIONS:  1. Per pharmacy NuIron 150 mg b.i.d.  2. Fleet enema p.r.n.  3. Phenergan 12.5 mg IV q.6 h p.r.n.  4.  Roxicodone 5 mg p.o. q.4 h p.r.n.  5. Atrovent nebulizer q.2 h p.r.n.  6. Ventolin nebulizer q.2 h p.r.n.  7. Dilaudid 0.5 to 1 mg IV q.4 h p.r.n.  8. Robitussin 5 mL q.4 h p.r.n.  9. Dulcolax 10 mg PR p.r.n.  10.Tylenol 650 mg p.o. p.r.n.  11.Protonix 40 mg p.o. daily.   REVIEW OF SYSTEMS:  Essentially negative.  She denies any fever, chills,  night sweats, headaches, confusion, double vision, dysphagia, shortness  of breath, productive cough, chest pain, abdominal pain, GERD symptoms.  She denies any nausea, vomiting, diarrhea, constipation.  No blood in  the stools.  No hematuria or dysuria.  No back pain, numbness or edema.  She has experienced weight loss for the last 2-3 months, but she does  not know the amount of weight loss.  She does have fatigue.  She was not  on estrogen replacement pills.  First period at age 85.  Menopause at  age 58.   FAMILY HISTORY:  Mother died with breast cancer.  Father died with colon  cancer.  One sister alive and well.  One brother with colon cancer.   SOCIAL HISTORY:  The patient is widowed.  She has three children in good  health.  Never smoked.  No alcohol history.  Lives in [place].  She  is Control and instrumentation engineer.  She works at [employer] in [employer].  Her  flu vaccine was performed on August 30, 2008.  She has not had a  physical since 1997 and the same with her mammogram, none since 1997.   PHYSICAL EXAMINATION:  GENERAL:  This is a well-developed, well-  nourished 76 year old African American female in no acute distress,  alert and oriented x3.  The patient looks younger than her stated age.  VITAL SIGNS:  Blood pressure 125/69, pulse 58, respirations 16,  temperature 98.1, pulse oximetry 97% on room air.  Weight 60.5  kilograms, height 62 inches.  HEENT:  Normocephalic, atraumatic.  PERRL.  Oral mucosa without thrush  or lesions.  NECK:  Supple.  No cervical or supraclavicular masses.  LUNGS:  Clear to auscultation bilaterally.   No axillary masses.  CARDIOVASCULAR:  Regular rate and rhythm without murmurs, rubs or  gallops.  ABDOMEN:  Soft, nontender.  Bowel sounds x4.  No hepatosplenomegaly.  EXTREMITIES:  With no clubbing or cyanosis.  No edema.  No inguinal  masses.  SKIN:  Remarkable for a large 9 x 8 cm fungating lesion, hard,  nontender, mobile, non-odorous, as per CT and a breast exam.  She has  ripple retraction at the site of the fungating lesion origin.  The left  axillary area is firm, hard to touch, immobile.  The fungating lesion is  not bleeding at this time.  There is no other area of bruising or  petechial rash.  GU:  Deferred.  RECTAL:  Deferred.  MUSCULOSKELETAL:  Without spinal tenderness.  NEURO:  Nonfocal.   LAB:  Hemoglobin 6, hematocrit 19.2, white count 15.1, platelets 426,  MCV 74.7, ANC 9.9, monocytes 0.7, lymphocytes 4.2, PTT 30, PT 14.4, INR  1.1.  Sodium 137, potassium 3.6, BUN 9, creatinine 0.68, total bilirubin  0.5, alkaline phosphatase 53, AST 27, ALT 17, total protein 9.8, albumin  3.2, calcium 8.6, magnesium 2.1.  The following labs are currently  pending:  LDH, CEA, CA 27/29, CA-125, TSH and the iron studies.  At this  time all the CTs ordered of the chest, abdomen and pelvis and head are  pending.  Bone scan is currently pending as well.   ASSESSMENT AND PLAN:  Dr. Welton Flakes has seen and evaluated the patient and  reviewed the chart.  This is a pleasant 76 year old woman without  significant past medical history admitted with bleeding left fungating  breast mass.  The differential diagnosis is breast cancer.  The patient  has been seen by surgery today.  A biopsy was ordered.  She also had CT  scans of the chest, abdomen and pelvis and bone scan for staging  purposes.  Would like to do a PET scan as an outpatient.  Clinically,  the patient seems to be doing well.  She is asymptomatic for her anemia.  The recommendations are as follows:   1. Agree with staging scans as  ordered and will follow up with the      results.  2. Breast biopsy is very important to assess ERPR, HER-2/neu, grade,  KI67, etc.  3. Would recommend chemotherapy, neoadjuvant and/or radiation therapy      to maximally decrease the tumor size followed by surgery.  This      approach hopefully will allow better surgical closure.  4. Dr. Welton Flakes has consulted radiation oncology, spoke with Dr. Mitzi Hansen.  5. Dr. Welton Flakes discussed the disease treatment options with the patient      and family.  They will come to the [facility] office for possible chemotherapy.   Thank you very much for this consult.  Will follow on the results and  further recommendations are to proceed.      Marlowe Kays, P.A.      Drue Second, MD  Electronically Signed    SW/MEDQ  D:  08/31/2008  T:  08/31/2008  Job:  [id number]

## 2011-03-18 NOTE — Discharge Summary (Signed)
NAMEAJEE, HEASLEY NO.:  [id number]   MEDICAL RECORD NO.:  [id number]          PATIENT TYPE:  INP   LOCATION:  1305                         FACILITY:  [facility]   PHYSICIAN:  Isidor Holts, M.D.  DATE OF BIRTH:  [birth year]   DATE OF ADMISSION:  08/29/2008  DATE OF DISCHARGE:  09/04/2008                               DISCHARGE SUMMARY   DISCHARGE DIAGNOSES:  1. 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  2. Acute on chronic blood loss anemia, secondary to #1. Required      transfusion 3 units PRBC.   DISCHARGE MEDICATIONS:  1. NuIron 150 mg p.o. b.i.d.  2. Augumentin 875 mg p.o. b.i.d. to be completed on September 09, 2008.  3. Vicodin (5/325) one p.o. p.r.n. q.6 hourly.  4. Bacitracin ointment applied topically to left breast wound daily.   PROCEDURES:  1. Chest x-ray dated August 29, 2008.  This showed apparent left      breast soft tissue mass, mild diffuse interstitial prominence      possibly mild chronic interstitial lung disease.  Borderline heart      size.  2. Chest CT scan dated August 30, 2008.  This showed left breast      primary with extensive thoracic nodal, pulmonary and pleural      metastases.  3. Abdominal CT scan dated August 30, 2008.  This showed equivocal      prominent nodes in the gastrocolic ligament.  Otherwise no evidence      of metastatic disease within the abdomen.  Nonobstructive punctate      left renal calculus.  4. Pelvic CT scan dated August 30, 2008.  This showed probable right-      sided groin osseous metastasis, borderline right pelvic adenopathy      indeterminate, fibroid uterus.  5. Head CT scan dated August 30, 2008.  This showed no acute      intracranial abnormality or evidence of intracranial metastasis or      sinus disease.  6. Nuclear medicine bone whole body scan August 30, 2008.  This      showed focally increased uptake in the left iliac wing      corresponding to a sclerotic lesion on CT  scan of August 30, 2008.      This finding is concerning for metastasis.  There was degenerative      uptake in the appendicular skeleton.  7. Breast mammogram August 31, 2008.  This showed no specific      normochromic evidence of malignancy within the right breast.  There      was a large fungating mass involving the majority of the lateral      left breast; ultrasound guided biopsy of left breast recommended  8. Ultrasound guided core biopsy of the left breast August 31, 2008.      This was an uncomplicated procedure.  Pathology report confirmed      invasive ductal carcinoma of the left breast.  9. Ultrasound/fluoroscopic guided Port-A-Cath placement August 31, 2008, by Dr. Irish Lack.  This was  an uncomplicated procedure.  10.A 2-D echocardiogram dated September 01, 2008.  This showed overall      normal left ventricular systolic function, EF 55% to 60%.  No      diagnostic left ventricular regional wall motion abnormality.  Left      ventricular wall thickness at the upper limits of normal.  Features      were consistent with mild diastolic dysfunction, aortic valve was      mildly calcified.  Estimated peak pulmonary artery systolic      pressure was mildly increased.  There was moderate tricuspid      valvular regurgitation.  Inferior vena cava was dilated.   CONSULTATIONS:  1. Dr. Drue Second, oncologist.  2. Dr. Almond Lint, general surgeon.  3. Dr. Dorothy Puffer, radiation oncologist.  4. Dr. Irish Lack, interventional radiologist.   ADMISSION HISTORY:  As in H and P notes of August 29, 2008, dictated by  Dr. Sabino Donovan.  However, in brief this is a 76 year old female, with  known history of dysthyroidism status post surgery, appendectomy, benign  lumpectomy from both breasts at age 10-16, otherwise no significant past  medical history, presenting with progressively enlarging left breast  mass of about one year's duration, now ulcerated and bleeding.   Reportedly, the patient would get an occasional discharge from the left  breast lesion and had been using triple antibiotic treatment.  However,  on August 29, 2008, she experienced profuse bleeding from the above  mentioned lesion.  She therefore called emergency medical services and  was brought to the emergency department, where she was subsequently  referred to the medical service for admission, evaluation, investigation  and management.   CLINICAL COURSE:  1. Left breast carcinoma.  For details of presentation, refer to      admission history above.  Physical examination revealed a clearly      palpable left breast mass which appeared to be fungating and      bleeding.  Imaging studies were done, including chest x-ray, chest,      abdominal, pelvic and head CT scans, nuclear medicine bone scan,      right breast mammogram, left breast ultrasound/ultrasound guided      core biopsy.  For details of findings, refer to procedure list      above.  Pathology confirmed invasive left breast ductal carcinoma.      The patient's neoplasm is classified as stage IV (T4 N3 M1).      Oncology consultation was kindly provided by Dr. Drue Second.  For      details of her consultation, refer to consultation notes of August 30, 2008.  She concurred with staging imaging studies and      emphasized that it was very important to obtain breast biopsy to      assess ER/PR HER-2/neu, grade KI67, etc.  She discussed therapeutic      options with the patient and her family and has recommended      chemotherapy neoadjuvant and radiation therapy to maximally      decrease the lesion size, followed by surgery.  With this in mind      Dr. Dorothy Puffer was consulted.  The patient underwent radiotherapy      simulation on September 01, 2008, and is anticipated to commence her      radiotherapy sessions in the coming week.  Surgical consultation      was kindly provided by Dr. Darrick Huntsman  Byerly.  For details of  the      consultation, refer to consultation notes of August 30, 2008.  She      has concurred with oncology assessment and will perform surgery at      the appropriate time, although she has also opined that should      bleeding become a persistent issue, the patient may require a      toilet mastectomy for bleeding control, although this will be a      less desirable option.  Meanwhile, local care of left breast wound      has been carried out on a daily basis, with nonstick dressings and      Bacitracin ointment.  The patient was placed on empiric antibiotic      therapy initially with intravenous Zosyn to cover the possibility      of concomitant infection.  As of September 02, 2008, the patient had      completed a 3 day course of Zosyn and was transitioned to oral      Augmentin from September 03, 2008, to complete a further 7 day      course.   1. Anemia.  The patient at the time of initial presentation, was found      to have a hemoglobin of 6.0 with a hematocrit of 19.2 and MCV of      74.7.  Clearly, this was secondary to acute on chronic blood loss      from the left breast lesion.  She was transfused with a total of 3      units PRBC, resulting in a satisfactory bump in hemoglobin to 10.5      on August 31, 2008.  During the course of hospitalization, there      has been a gradual trending down of her hemoglobin level and as of      September 03, 2008, hemoglobin was 8.9 with hematocrit of 27.4.      Hematinic studies showed iron of 66, TIBC 338, percentage      saturation 20, ferritin 6.  Against such a background of profound      iron deficiency, it was felt prudent to administer intravenous      INFED and this was carried out on August 31, 2008.  Thereafter,      the patient was continued on oral iron supplements.  Of course, the      patient probably also has concomitant anemia of chronic disease and      it is likely that in the future, she will require p.r.n. blood       transfusions.   DISPOSITION:  The patient was on September 03, 2008, considered clinically  stable for discharge to be considered on September 04, 2008.  As mentioned  above, she is anticipated to commence her first radiotherapy treatment  in the coming week. Provided there are no further acute problems, it is  likely that she will be discharged.   DIET:  No restrictions.   ACTIVITY:  As tolerated.   WOUND CARE:  Bacitracin ointment to left breast wound daily/utilization  of nonstick dressings. May shower daily, without wound dressings.   FOLLOWUP INSTRUCTIONS:  The patient is to follow up with Dr. Drue Second, oncologist, on a date to be scheduled, telephone number  [phone].  Also she is to follow up with Dr. Dorothy Puffer, radiation  oncologist, per schedule, for radiotherapy treatments.  In addition she  is to follow  up with general surgeon, Dr. Almond Lint, in 2-3 weeks to  evaluate wound, telephone number [phone].   SPECIAL INSTRUCTIONS:  Home health RN for wound dressings, home health  aide and transport to and from radiotherapy sessions, have been  arranged.      Isidor Holts, M.D.  Electronically Signed     CO/MEDQ  D:  09/03/2008  T:  09/03/2008  Job:  [id number]   cc:   Drue Second, MD  Fax: [phone]   Radene Gunning, MD, PhD  Fax: [phone]   Almond Lint, MD  [address]  [place] [address]

## 2011-03-18 NOTE — Consult Note (Signed)
NAMEKENNYA, SCHWENN NO.:  [id number]   MEDICAL RECORD NO.:  [id number]          PATIENT TYPE:  INP   LOCATION:  1305                         FACILITY:  [facility]   PHYSICIAN:  Almond Lint, MD       DATE OF BIRTH:  [birth year]   DATE OF CONSULTATION:  08/30/2008  DATE OF DISCHARGE:                                 CONSULTATION   REFERRING PHYSICIAN:  Hillery Aldo, M.D.   CHIEF COMPLAINT:  Fungating left breast mass.   HISTORY OF PRESENT ILLNESS:  Ms. Prindle is a very nice 76 year old  female who called 9-1-1 from home because she had a one year history of  a fungating breast mass and it started bleeding significantly.  She did  not seek medical care for this mass for the last year because of fear of  embarrassment and fear of bad news.  She states yesterday she could not  get the bleeding to stop, and that prompted her call to 9-1-1.  When she  got here, her hemoglobin was 6.  She did not have any significant  lightheadedness, but did feel very weak and felt better after receiving  blood.  She reports this as a left sided mass versus the lump that she  notices smaller then a golf ball, but then got significantly larger and  went through the skin over the last year.  She does not report  difficulty with her arm movement.  She has had a weight loss of about 15  pounds.  She denies any jaundice.  She reportedly had a seizure  following a tooth extraction while on codeine and penicillin, and this  was attributed to an allergic reaction.  Otherwise, she has no  neurologic symptoms.  She does not report any dyspnea.  She has putting  triple antibiotic ointment on this mass for thoughts that it might have  been infected.   PAST MEDICAL HISTORY:  Hyperthyroidism and had a thyroidectomy, but  currently is not on any thyroid hormones.   PAST SURGICAL HISTORY:  1. Appendectomy.  2. Thyroidectomy.  3. Bilateral breast biopsies as a teenager which were benign.   FAMILY HISTORY:  She had a mother with breast cancer who had a  mastectomy, and she said that her mother was in her 55s at time of  diagnosis.   SOCIAL HISTORY:  Does not smoke or drink alcohol, and has been a  Advertising copywriter at [employer].   ALLERGIES:  CODEINE, PENICILLIN, ONE OF WHICH THEY FEEL GAVE HER A  SEIZURE.   MEDICATIONS:  Multivitamin.   REVIEW OF SYSTEMS:  Otherwise, negative x 11 systems.   PHYSICAL EXAMINATION:  VITAL SIGNS:  T-max is 98.9, pulse 58, blood  pressure 125/69, respirations 16, 97% on room air.  She received three  units of blood last night.  GENERAL:  Alert and oriented, sitting on a bed.  She has a clean, dry  dressing over the left breast wound.  HEENT:  Normocephalic, atraumatic.  Sclerae are anicteric.  NECK:  Supple with no palpable lymphadenopathy.  BREASTS:  Left breast demonstrates a greater than 10  cm fungating mass.  It appears to be mobile from the chest wall.  The nipple is at the edge  of the mass and is significantly retracted into the mass.  The left  axilla is very firm and also extremely retracted.  This is non-mobile.  There are some palpable masses, but it is unclear if this is just  significantly foreshortened and retracted skin or if this is actually  underlying adenopathy.  There is no nipple discharge.  The mass does  have some areas of friability that with the dressing removal started to  ooze a little bit, but stopped quickly with pressure.  ABDOMEN:  Soft, nontender, nondistended.  HEART:  Regular.  EXTREMITIES:  Warm and well-perfused with no pitting edema.   STUDIES:  Currently, the only studies that we have are chest x-ray which  did not demonstrate any masses in the chest or bony abnormality.   LABORATORY DATA:  White count yesterday was 15, H&H 6 and 19.2.  Upon  arrival yesterday, platelet count 476, coagulation studies are within  normal limits.  Glucose is mildly elevated at 111, otherwise,  electrolytes are within normal  limits.  LFTs are within normal limits.  Albumin 3.2.  UA demonstrates small number of leukocytes.  Otherwise,  negative.  She is O positive.  Blood cultures are pending.   ASSESSMENT:  Ms. [place] is a 76 year old female with a fungating  breast mass that is mostly likely invasive ductal adenocarcinoma.  We  need to obtain mammograms and ultrasound of the opposite breast to  assess whether she has any abnormalities.  Also, need biopsy of this  mass.  She will likely require a toilet mastectomy for control of  bleeding.  Will also obtain staging studies which include a bone scan  and CT of the head, chest, abdomen and pelvis.  Oncology is requesting  some blood work as well including tumor markers and LDH.  Depending on  what we find, we will determine whether or not we proceed with  aggressive care or with palliative care only.  She may be able to have  wound control with chemoradiation, which would be less morbid.  I will  be in contact with Heme/Onc, and depending on what our studies show, we  will likely involve radiation oncology as well.      Almond Lint, MD  Electronically Signed     FB/MEDQ  D:  08/30/2008  T:  08/30/2008  Job:  [id number]

## 2011-03-19 ENCOUNTER — Encounter (HOSPITAL_BASED_OUTPATIENT_CLINIC_OR_DEPARTMENT_OTHER): Payer: Medicare Other | Admitting: Oncology

## 2011-03-19 DIAGNOSIS — Z5112 Encounter for antineoplastic immunotherapy: Secondary | ICD-10-CM

## 2011-03-19 DIAGNOSIS — C8589 Other specified types of non-Hodgkin lymphoma, extranodal and solid organ sites: Secondary | ICD-10-CM

## 2011-03-19 DIAGNOSIS — Z5111 Encounter for antineoplastic chemotherapy: Secondary | ICD-10-CM

## 2011-03-19 DIAGNOSIS — C9110 Chronic lymphocytic leukemia of B-cell type not having achieved remission: Secondary | ICD-10-CM

## 2011-03-20 ENCOUNTER — Encounter: Payer: Self-pay | Admitting: Internal Medicine

## 2011-03-21 ENCOUNTER — Ambulatory Visit: Payer: Medicare Other | Admitting: Internal Medicine

## 2011-03-25 ENCOUNTER — Ambulatory Visit (INDEPENDENT_AMBULATORY_CARE_PROVIDER_SITE_OTHER): Payer: Medicare Other | Admitting: Internal Medicine

## 2011-03-25 ENCOUNTER — Encounter (HOSPITAL_BASED_OUTPATIENT_CLINIC_OR_DEPARTMENT_OTHER): Payer: Medicare Other | Admitting: Oncology

## 2011-03-25 ENCOUNTER — Other Ambulatory Visit: Payer: Self-pay | Admitting: Oncology

## 2011-03-25 ENCOUNTER — Encounter: Payer: Self-pay | Admitting: Internal Medicine

## 2011-03-25 VITALS — BP 136/82 | HR 78 | Temp 98.2°F | Resp 16 | Wt 149.0 lb

## 2011-03-25 DIAGNOSIS — D6489 Other specified anemias: Secondary | ICD-10-CM

## 2011-03-25 DIAGNOSIS — C9110 Chronic lymphocytic leukemia of B-cell type not having achieved remission: Secondary | ICD-10-CM

## 2011-03-25 DIAGNOSIS — M1710 Unilateral primary osteoarthritis, unspecified knee: Secondary | ICD-10-CM

## 2011-03-25 DIAGNOSIS — C801 Malignant (primary) neoplasm, unspecified: Secondary | ICD-10-CM

## 2011-03-25 DIAGNOSIS — C7800 Secondary malignant neoplasm of unspecified lung: Secondary | ICD-10-CM

## 2011-03-25 DIAGNOSIS — C8589 Other specified types of non-Hodgkin lymphoma, extranodal and solid organ sites: Secondary | ICD-10-CM

## 2011-03-25 DIAGNOSIS — C50419 Malignant neoplasm of upper-outer quadrant of unspecified female breast: Secondary | ICD-10-CM

## 2011-03-25 DIAGNOSIS — E890 Postprocedural hypothyroidism: Secondary | ICD-10-CM

## 2011-03-25 LAB — CBC WITH DIFFERENTIAL/PLATELET
BASO%: 0.1 % (ref 0.0–2.0)
LYMPH%: 17.4 % (ref 14.0–49.7)
MCH: 29.3 pg (ref 25.1–34.0)
MCHC: 33.9 g/dL (ref 31.5–36.0)
MCV: 86.5 fL (ref 79.5–101.0)
MONO%: 1.6 % (ref 0.0–14.0)
Platelets: 278 10*3/uL (ref 145–400)
RBC: 3.98 10*6/uL (ref 3.70–5.45)

## 2011-03-25 LAB — URIC ACID: Uric Acid, Serum: 3.9 mg/dL (ref 2.4–7.0)

## 2011-03-25 LAB — BASIC METABOLIC PANEL
Calcium: 9.2 mg/dL (ref 8.4–10.5)
Sodium: 140 mEq/L (ref 135–145)

## 2011-03-25 NOTE — Progress Notes (Signed)
  Subjective:    Patient ID: Helen [name], female    DOB: July 04, 1935, 76 y.o.   MRN: [id number]  HPI She returns for f/up and tells me that she has been diagnosed with lymphoma and is s/p surgery and is on chemotherapy. She is doing well and remains optimistic. She has not been taking celebrex very much recently and says that her knee pain has been a little worse lately.   Review of Systems  Constitutional: Negative for fever, chills, diaphoresis, activity change, appetite change, fatigue and unexpected weight change.  Respiratory: Negative for cough, shortness of breath, wheezing and stridor.   Cardiovascular: Negative for chest pain, palpitations and leg swelling.  Gastrointestinal: Negative for nausea, vomiting, abdominal pain, diarrhea and constipation.  Genitourinary: Negative for dysuria, urgency, frequency, hematuria, decreased urine volume and difficulty urinating.  Musculoskeletal: Positive for arthralgias. Negative for myalgias, back pain, joint swelling and gait problem.  Skin: Negative for color change and pallor.  Neurological: Negative for dizziness, tremors, seizures, syncope, facial asymmetry, speech difficulty, weakness, light-headedness, numbness and headaches.  Hematological: Negative for adenopathy.  Psychiatric/Behavioral: Negative.        Objective:   Physical Exam  Vitals reviewed. Constitutional: She is oriented to person, place, and time. She appears well-developed and well-nourished. No distress.  HENT:  Head: Normocephalic and atraumatic.  Right Ear: External ear normal.  Left Ear: External ear normal.  Nose: Nose normal.  Mouth/Throat: No oropharyngeal exudate.  Eyes: Conjunctivae and EOM are normal. Pupils are equal, round, and reactive to light. Right eye exhibits no discharge. Left eye exhibits no discharge. No scleral icterus.  Neck: Normal range of motion. Neck supple. No JVD present. No tracheal deviation present. No thyromegaly present.    Cardiovascular: Normal rate, regular rhythm, normal heart sounds and intact distal pulses.  Exam reveals no gallop and no friction rub.   No murmur heard. Pulmonary/Chest: Effort normal and breath sounds normal. No stridor. No respiratory distress. She has no wheezes. She has no rales. She exhibits no tenderness.  Abdominal: Soft. Bowel sounds are normal. She exhibits no distension. There is no tenderness. There is no rebound and no guarding.  Musculoskeletal: Normal range of motion. She exhibits no edema and no tenderness.  Lymphadenopathy:    She has no cervical adenopathy.  Neurological: She is alert and oriented to person, place, and time. She has normal reflexes.  Skin: Skin is warm and dry. No rash noted. She is not diaphoretic. No erythema. No pallor.  Psychiatric: She has a normal mood and affect. Her behavior is normal. Judgment and thought content normal.        Lab Results  Component Value Date   WBC 10.8* 03/11/2011   HGB 12.2 03/18/2011   HCT 36.8 03/18/2011   PLT 252 03/18/2011   ALT 11 03/12/2011   ALT 11 03/12/2011   ALT 11 03/12/2011   ALT 11 03/12/2011   AST 16 03/12/2011   AST 16 03/12/2011   AST 16 03/12/2011   AST 16 03/12/2011   NA 139 03/18/2011   K 4.1 03/18/2011   CL 106 03/18/2011   CREATININE 0.72 03/18/2011   BUN 13 03/18/2011   CO2 21 03/18/2011   TSH 2.96 01/17/2011   INR 0.97 03/11/2011    Assessment & Plan:

## 2011-03-31 NOTE — Assessment & Plan Note (Signed)
Restart celebrex

## 2011-03-31 NOTE — Assessment & Plan Note (Signed)
This has resolved.

## 2011-03-31 NOTE — Assessment & Plan Note (Signed)
She is euthyroid.

## 2011-04-08 ENCOUNTER — Other Ambulatory Visit: Payer: Self-pay | Admitting: Oncology

## 2011-04-08 ENCOUNTER — Encounter (HOSPITAL_BASED_OUTPATIENT_CLINIC_OR_DEPARTMENT_OTHER): Payer: Medicare Other | Admitting: Oncology

## 2011-04-08 DIAGNOSIS — C8589 Other specified types of non-Hodgkin lymphoma, extranodal and solid organ sites: Secondary | ICD-10-CM

## 2011-04-08 DIAGNOSIS — C50919 Malignant neoplasm of unspecified site of unspecified female breast: Secondary | ICD-10-CM

## 2011-04-08 DIAGNOSIS — C801 Malignant (primary) neoplasm, unspecified: Secondary | ICD-10-CM

## 2011-04-08 DIAGNOSIS — C7952 Secondary malignant neoplasm of bone marrow: Secondary | ICD-10-CM

## 2011-04-08 DIAGNOSIS — C7800 Secondary malignant neoplasm of unspecified lung: Secondary | ICD-10-CM

## 2011-04-08 DIAGNOSIS — C9110 Chronic lymphocytic leukemia of B-cell type not having achieved remission: Secondary | ICD-10-CM

## 2011-04-08 DIAGNOSIS — C50419 Malignant neoplasm of upper-outer quadrant of unspecified female breast: Secondary | ICD-10-CM

## 2011-04-08 LAB — CBC WITH DIFFERENTIAL/PLATELET
Basophils Absolute: 0 10*3/uL (ref 0.0–0.1)
EOS%: 3.1 % (ref 0.0–7.0)
HGB: 12.3 g/dL (ref 11.6–15.9)
MCH: 28.9 pg (ref 25.1–34.0)
NEUT#: 3.4 10*3/uL (ref 1.5–6.5)
RBC: 4.25 10*6/uL (ref 3.70–5.45)
RDW: 13.4 % (ref 11.2–14.5)
lymph#: 0.6 10*3/uL — ABNORMAL LOW (ref 0.9–3.3)

## 2011-04-08 LAB — COMPREHENSIVE METABOLIC PANEL
ALT: 12 U/L (ref 0–35)
AST: 18 U/L (ref 0–37)
Albumin: 4.2 g/dL (ref 3.5–5.2)
BUN: 11 mg/dL (ref 6–23)
Calcium: 9.5 mg/dL (ref 8.4–10.5)
Chloride: 107 mEq/L (ref 96–112)
Potassium: 3.9 mEq/L (ref 3.5–5.3)
Sodium: 141 mEq/L (ref 135–145)
Total Protein: 7.2 g/dL (ref 6.0–8.3)

## 2011-04-09 ENCOUNTER — Encounter (HOSPITAL_BASED_OUTPATIENT_CLINIC_OR_DEPARTMENT_OTHER): Payer: Medicare Other | Admitting: Oncology

## 2011-04-09 DIAGNOSIS — C9110 Chronic lymphocytic leukemia of B-cell type not having achieved remission: Secondary | ICD-10-CM

## 2011-04-09 DIAGNOSIS — Z5112 Encounter for antineoplastic immunotherapy: Secondary | ICD-10-CM

## 2011-04-09 DIAGNOSIS — Z5111 Encounter for antineoplastic chemotherapy: Secondary | ICD-10-CM

## 2011-04-15 ENCOUNTER — Encounter (HOSPITAL_BASED_OUTPATIENT_CLINIC_OR_DEPARTMENT_OTHER): Payer: Medicare Other | Admitting: Oncology

## 2011-04-15 ENCOUNTER — Other Ambulatory Visit: Payer: Self-pay | Admitting: Oncology

## 2011-04-15 DIAGNOSIS — C7800 Secondary malignant neoplasm of unspecified lung: Secondary | ICD-10-CM

## 2011-04-15 DIAGNOSIS — C50419 Malignant neoplasm of upper-outer quadrant of unspecified female breast: Secondary | ICD-10-CM

## 2011-04-15 DIAGNOSIS — C801 Malignant (primary) neoplasm, unspecified: Secondary | ICD-10-CM

## 2011-04-15 DIAGNOSIS — C9110 Chronic lymphocytic leukemia of B-cell type not having achieved remission: Secondary | ICD-10-CM

## 2011-04-15 DIAGNOSIS — C8590 Non-Hodgkin lymphoma, unspecified, unspecified site: Secondary | ICD-10-CM

## 2011-04-15 DIAGNOSIS — Z853 Personal history of malignant neoplasm of breast: Secondary | ICD-10-CM

## 2011-04-15 DIAGNOSIS — C7952 Secondary malignant neoplasm of bone marrow: Secondary | ICD-10-CM

## 2011-04-15 LAB — CBC WITH DIFFERENTIAL/PLATELET
BASO%: 0 % (ref 0.0–2.0)
EOS%: 2.1 % (ref 0.0–7.0)
LYMPH%: 7.8 % — ABNORMAL LOW (ref 14.0–49.7)
MCH: 28.9 pg (ref 25.1–34.0)
MCHC: 33.6 g/dL (ref 31.5–36.0)
MCV: 85.9 fL (ref 79.5–101.0)
MONO#: 0.1 10*3/uL (ref 0.1–0.9)
MONO%: 1.8 % (ref 0.0–14.0)
Platelets: 245 10*3/uL (ref 145–400)
RBC: 4.09 10*6/uL (ref 3.70–5.45)
WBC: 6.3 10*3/uL (ref 3.9–10.3)

## 2011-04-15 LAB — URIC ACID: Uric Acid, Serum: 4.3 mg/dL (ref 2.4–7.0)

## 2011-04-15 LAB — BASIC METABOLIC PANEL
Chloride: 104 mEq/L (ref 96–112)
Glucose, Bld: 113 mg/dL — ABNORMAL HIGH (ref 70–99)
Potassium: 3.1 mEq/L — ABNORMAL LOW (ref 3.5–5.3)
Sodium: 138 mEq/L (ref 135–145)

## 2011-04-15 LAB — TECHNOLOGIST REVIEW

## 2011-04-28 ENCOUNTER — Encounter (HOSPITAL_BASED_OUTPATIENT_CLINIC_OR_DEPARTMENT_OTHER): Payer: Medicare Other | Admitting: Oncology

## 2011-04-28 ENCOUNTER — Other Ambulatory Visit: Payer: Self-pay | Admitting: Oncology

## 2011-04-28 DIAGNOSIS — C50419 Malignant neoplasm of upper-outer quadrant of unspecified female breast: Secondary | ICD-10-CM

## 2011-04-28 DIAGNOSIS — C9110 Chronic lymphocytic leukemia of B-cell type not having achieved remission: Secondary | ICD-10-CM

## 2011-04-28 DIAGNOSIS — C7800 Secondary malignant neoplasm of unspecified lung: Secondary | ICD-10-CM

## 2011-04-28 DIAGNOSIS — C801 Malignant (primary) neoplasm, unspecified: Secondary | ICD-10-CM

## 2011-04-28 DIAGNOSIS — C7952 Secondary malignant neoplasm of bone marrow: Secondary | ICD-10-CM

## 2011-04-28 LAB — COMPREHENSIVE METABOLIC PANEL
BUN: 9 mg/dL (ref 6–23)
CO2: 21 mEq/L (ref 19–32)
Calcium: 9.4 mg/dL (ref 8.4–10.5)
Chloride: 108 mEq/L (ref 96–112)
Creatinine, Ser: 0.72 mg/dL (ref 0.50–1.10)
Glucose, Bld: 79 mg/dL (ref 70–99)

## 2011-04-28 LAB — CBC WITH DIFFERENTIAL/PLATELET
Basophils Absolute: 0 10*3/uL (ref 0.0–0.1)
HCT: 33.8 % — ABNORMAL LOW (ref 34.8–46.6)
HGB: 11.3 g/dL — ABNORMAL LOW (ref 11.6–15.9)
MONO#: 0.6 10*3/uL (ref 0.1–0.9)
NEUT%: 76.5 % (ref 38.4–76.8)
WBC: 4.9 10*3/uL (ref 3.9–10.3)
lymph#: 0.4 10*3/uL — ABNORMAL LOW (ref 0.9–3.3)

## 2011-04-30 ENCOUNTER — Encounter (HOSPITAL_BASED_OUTPATIENT_CLINIC_OR_DEPARTMENT_OTHER): Payer: Medicare Other | Admitting: Oncology

## 2011-04-30 DIAGNOSIS — C9110 Chronic lymphocytic leukemia of B-cell type not having achieved remission: Secondary | ICD-10-CM

## 2011-04-30 DIAGNOSIS — Z5111 Encounter for antineoplastic chemotherapy: Secondary | ICD-10-CM

## 2011-05-06 ENCOUNTER — Other Ambulatory Visit: Payer: Self-pay | Admitting: Oncology

## 2011-05-06 ENCOUNTER — Encounter (HOSPITAL_BASED_OUTPATIENT_CLINIC_OR_DEPARTMENT_OTHER): Payer: Medicare Other | Admitting: Oncology

## 2011-05-06 DIAGNOSIS — C50919 Malignant neoplasm of unspecified site of unspecified female breast: Secondary | ICD-10-CM

## 2011-05-06 DIAGNOSIS — C9110 Chronic lymphocytic leukemia of B-cell type not having achieved remission: Secondary | ICD-10-CM

## 2011-05-06 DIAGNOSIS — C8589 Other specified types of non-Hodgkin lymphoma, extranodal and solid organ sites: Secondary | ICD-10-CM

## 2011-05-06 LAB — CBC WITH DIFFERENTIAL/PLATELET
BASO%: 0.1 % (ref 0.0–2.0)
HCT: 33.4 % — ABNORMAL LOW (ref 34.8–46.6)
HGB: 11.2 g/dL — ABNORMAL LOW (ref 11.6–15.9)
MCHC: 33.5 g/dL (ref 31.5–36.0)
MONO#: 0.2 10*3/uL (ref 0.1–0.9)
NEUT%: 88.1 % — ABNORMAL HIGH (ref 38.4–76.8)
WBC: 7.3 10*3/uL (ref 3.9–10.3)
lymph#: 0.5 10*3/uL — ABNORMAL LOW (ref 0.9–3.3)

## 2011-05-06 LAB — BASIC METABOLIC PANEL
BUN: 13 mg/dL (ref 6–23)
Chloride: 106 mEq/L (ref 96–112)
Creatinine, Ser: 0.74 mg/dL (ref 0.50–1.10)
Glucose, Bld: 89 mg/dL (ref 70–99)

## 2011-05-13 ENCOUNTER — Ambulatory Visit (HOSPITAL_COMMUNITY)
Admission: RE | Admit: 2011-05-13 | Discharge: 2011-05-13 | Disposition: A | Discharge status: Home / Self Care | Payer: Medicare Other | Source: Ambulatory Visit | Attending: Oncology | Admitting: Oncology

## 2011-05-13 DIAGNOSIS — C9590 Leukemia, unspecified not having achieved remission: Secondary | ICD-10-CM | POA: Insufficient documentation

## 2011-05-13 DIAGNOSIS — R059 Cough, unspecified: Secondary | ICD-10-CM | POA: Insufficient documentation

## 2011-05-13 DIAGNOSIS — R109 Unspecified abdominal pain: Secondary | ICD-10-CM | POA: Insufficient documentation

## 2011-05-13 DIAGNOSIS — C50919 Malignant neoplasm of unspecified site of unspecified female breast: Secondary | ICD-10-CM | POA: Insufficient documentation

## 2011-05-13 DIAGNOSIS — J984 Other disorders of lung: Secondary | ICD-10-CM | POA: Insufficient documentation

## 2011-05-13 DIAGNOSIS — R05 Cough: Secondary | ICD-10-CM | POA: Insufficient documentation

## 2011-05-13 DIAGNOSIS — M542 Cervicalgia: Secondary | ICD-10-CM | POA: Insufficient documentation

## 2011-05-13 DIAGNOSIS — C8590 Non-Hodgkin lymphoma, unspecified, unspecified site: Secondary | ICD-10-CM

## 2011-05-13 DIAGNOSIS — R197 Diarrhea, unspecified: Secondary | ICD-10-CM | POA: Insufficient documentation

## 2011-05-13 DIAGNOSIS — K4020 Bilateral inguinal hernia, without obstruction or gangrene, not specified as recurrent: Secondary | ICD-10-CM | POA: Insufficient documentation

## 2011-05-13 DIAGNOSIS — C8589 Other specified types of non-Hodgkin lymphoma, extranodal and solid organ sites: Secondary | ICD-10-CM | POA: Insufficient documentation

## 2011-05-13 MED ORDER — IOHEXOL 300 MG/ML  SOLN
100.0000 mL | Freq: Once | INTRAMUSCULAR | Status: AC | PRN
  Administered 2011-05-13: 100 mL via INTRAVENOUS

## 2011-05-20 ENCOUNTER — Other Ambulatory Visit: Payer: Self-pay | Admitting: Oncology

## 2011-05-20 ENCOUNTER — Encounter (HOSPITAL_BASED_OUTPATIENT_CLINIC_OR_DEPARTMENT_OTHER): Payer: Medicare Other | Admitting: Oncology

## 2011-05-20 DIAGNOSIS — C8589 Other specified types of non-Hodgkin lymphoma, extranodal and solid organ sites: Secondary | ICD-10-CM

## 2011-05-20 DIAGNOSIS — Z853 Personal history of malignant neoplasm of breast: Secondary | ICD-10-CM

## 2011-05-20 DIAGNOSIS — C9110 Chronic lymphocytic leukemia of B-cell type not having achieved remission: Secondary | ICD-10-CM

## 2011-05-20 DIAGNOSIS — Z5111 Encounter for antineoplastic chemotherapy: Secondary | ICD-10-CM

## 2011-05-20 LAB — COMPREHENSIVE METABOLIC PANEL
ALT: 12 U/L (ref 0–35)
Albumin: 4.1 g/dL (ref 3.5–5.2)
CO2: 20 mEq/L (ref 19–32)
Calcium: 9.9 mg/dL (ref 8.4–10.5)
Chloride: 106 mEq/L (ref 96–112)
Sodium: 138 mEq/L (ref 135–145)
Total Protein: 6.9 g/dL (ref 6.0–8.3)

## 2011-05-20 LAB — CBC WITH DIFFERENTIAL/PLATELET
BASO%: 0.5 % (ref 0.0–2.0)
EOS%: 4.1 % (ref 0.0–7.0)
MCH: 28.7 pg (ref 25.1–34.0)
MCHC: 33.3 g/dL (ref 31.5–36.0)
MCV: 86.1 fL (ref 79.5–101.0)
MONO%: 10 % (ref 0.0–14.0)
NEUT#: 3.9 10*3/uL (ref 1.5–6.5)
RBC: 4.22 10*6/uL (ref 3.70–5.45)
RDW: 14.3 % (ref 11.2–14.5)

## 2011-05-21 ENCOUNTER — Encounter (HOSPITAL_BASED_OUTPATIENT_CLINIC_OR_DEPARTMENT_OTHER): Payer: Medicare Other | Admitting: Oncology

## 2011-05-21 DIAGNOSIS — C9110 Chronic lymphocytic leukemia of B-cell type not having achieved remission: Secondary | ICD-10-CM

## 2011-05-21 DIAGNOSIS — Z5111 Encounter for antineoplastic chemotherapy: Secondary | ICD-10-CM

## 2011-05-27 ENCOUNTER — Encounter (HOSPITAL_BASED_OUTPATIENT_CLINIC_OR_DEPARTMENT_OTHER): Payer: Medicare Other | Admitting: Oncology

## 2011-05-27 ENCOUNTER — Other Ambulatory Visit: Payer: Self-pay | Admitting: Oncology

## 2011-05-27 DIAGNOSIS — C9110 Chronic lymphocytic leukemia of B-cell type not having achieved remission: Secondary | ICD-10-CM

## 2011-05-27 DIAGNOSIS — Z853 Personal history of malignant neoplasm of breast: Secondary | ICD-10-CM

## 2011-05-27 LAB — CBC WITH DIFFERENTIAL/PLATELET
BASO%: 0.1 % (ref 0.0–2.0)
MCHC: 33.6 g/dL (ref 31.5–36.0)
MONO#: 0.1 10*3/uL (ref 0.1–0.9)
RBC: 3.74 10*6/uL (ref 3.70–5.45)
WBC: 4.9 10*3/uL (ref 3.9–10.3)
lymph#: 0.3 10*3/uL — ABNORMAL LOW (ref 0.9–3.3)

## 2011-05-27 LAB — BASIC METABOLIC PANEL
CO2: 22 mEq/L (ref 19–32)
Calcium: 9 mg/dL (ref 8.4–10.5)
Chloride: 108 mEq/L (ref 96–112)
Sodium: 142 mEq/L (ref 135–145)

## 2011-06-17 ENCOUNTER — Other Ambulatory Visit: Payer: Self-pay | Admitting: Oncology

## 2011-06-17 ENCOUNTER — Encounter (HOSPITAL_BASED_OUTPATIENT_CLINIC_OR_DEPARTMENT_OTHER): Payer: Medicare Other | Admitting: Oncology

## 2011-06-17 DIAGNOSIS — Z5111 Encounter for antineoplastic chemotherapy: Secondary | ICD-10-CM

## 2011-06-17 DIAGNOSIS — Z853 Personal history of malignant neoplasm of breast: Secondary | ICD-10-CM

## 2011-06-17 DIAGNOSIS — C9110 Chronic lymphocytic leukemia of B-cell type not having achieved remission: Secondary | ICD-10-CM

## 2011-06-17 LAB — CBC WITH DIFFERENTIAL/PLATELET
Basophils Absolute: 0 10*3/uL (ref 0.0–0.1)
EOS%: 3.7 % (ref 0.0–7.0)
Eosinophils Absolute: 0.2 10*3/uL (ref 0.0–0.5)
HCT: 34.8 % (ref 34.8–46.6)
HGB: 11.1 g/dL — ABNORMAL LOW (ref 11.6–15.9)
MCH: 27.8 pg (ref 25.1–34.0)
NEUT#: 2.7 10*3/uL (ref 1.5–6.5)
NEUT%: 62.3 % (ref 38.4–76.8)
lymph#: 0.6 10*3/uL — ABNORMAL LOW (ref 0.9–3.3)

## 2011-06-17 LAB — COMPREHENSIVE METABOLIC PANEL
Albumin: 4 g/dL (ref 3.5–5.2)
Alkaline Phosphatase: 71 U/L (ref 39–117)
BUN: 14 mg/dL (ref 6–23)
CO2: 19 mEq/L (ref 19–32)
Calcium: 9.7 mg/dL (ref 8.4–10.5)
Chloride: 107 mEq/L (ref 96–112)
Glucose, Bld: 148 mg/dL — ABNORMAL HIGH (ref 70–99)
Potassium: 3.4 mEq/L — ABNORMAL LOW (ref 3.5–5.3)
Total Protein: 6.7 g/dL (ref 6.0–8.3)

## 2011-06-24 ENCOUNTER — Other Ambulatory Visit: Payer: Self-pay | Admitting: Oncology

## 2011-06-24 ENCOUNTER — Encounter (HOSPITAL_BASED_OUTPATIENT_CLINIC_OR_DEPARTMENT_OTHER): Payer: Medicare Other | Admitting: Oncology

## 2011-06-24 DIAGNOSIS — Z5111 Encounter for antineoplastic chemotherapy: Secondary | ICD-10-CM

## 2011-06-24 DIAGNOSIS — C9110 Chronic lymphocytic leukemia of B-cell type not having achieved remission: Secondary | ICD-10-CM

## 2011-06-24 DIAGNOSIS — Z853 Personal history of malignant neoplasm of breast: Secondary | ICD-10-CM

## 2011-06-24 LAB — BASIC METABOLIC PANEL
Glucose, Bld: 114 mg/dL — ABNORMAL HIGH (ref 70–99)
Potassium: 3.8 mEq/L (ref 3.5–5.3)
Sodium: 140 mEq/L (ref 135–145)

## 2011-06-24 LAB — CBC WITH DIFFERENTIAL/PLATELET
Basophils Absolute: 0 10*3/uL (ref 0.0–0.1)
Eosinophils Absolute: 0.3 10*3/uL (ref 0.0–0.5)
HGB: 11 g/dL — ABNORMAL LOW (ref 11.6–15.9)
MONO#: 0.1 10*3/uL (ref 0.1–0.9)
NEUT#: 1.9 10*3/uL (ref 1.5–6.5)
RBC: 3.87 10*6/uL (ref 3.70–5.45)
RDW: 15.5 % — ABNORMAL HIGH (ref 11.2–14.5)
WBC: 2.7 10*3/uL — ABNORMAL LOW (ref 3.9–10.3)
lymph#: 0.3 10*3/uL — ABNORMAL LOW (ref 0.9–3.3)

## 2011-07-09 ENCOUNTER — Encounter (HOSPITAL_BASED_OUTPATIENT_CLINIC_OR_DEPARTMENT_OTHER): Payer: Medicare Other | Admitting: Oncology

## 2011-07-09 ENCOUNTER — Other Ambulatory Visit: Payer: Self-pay | Admitting: Oncology

## 2011-07-09 DIAGNOSIS — Z5112 Encounter for antineoplastic immunotherapy: Secondary | ICD-10-CM

## 2011-07-09 DIAGNOSIS — Z5111 Encounter for antineoplastic chemotherapy: Secondary | ICD-10-CM

## 2011-07-09 DIAGNOSIS — C9110 Chronic lymphocytic leukemia of B-cell type not having achieved remission: Secondary | ICD-10-CM

## 2011-07-09 DIAGNOSIS — C8590 Non-Hodgkin lymphoma, unspecified, unspecified site: Secondary | ICD-10-CM

## 2011-07-09 LAB — CBC WITH DIFFERENTIAL/PLATELET
Basophils Absolute: 0 10*3/uL (ref 0.0–0.1)
EOS%: 0.3 % (ref 0.0–7.0)
MCH: 27 pg (ref 25.1–34.0)
MCHC: 32.4 g/dL (ref 31.5–36.0)
MCV: 83.5 fL (ref 79.5–101.0)
MONO%: 3.7 % (ref 0.0–14.0)
RBC: 4.07 10*6/uL (ref 3.70–5.45)
RDW: 15.1 % — ABNORMAL HIGH (ref 11.2–14.5)
nRBC: 0 % (ref 0–0)

## 2011-07-09 LAB — COMPREHENSIVE METABOLIC PANEL
Albumin: 4.4 g/dL (ref 3.5–5.2)
Alkaline Phosphatase: 57 U/L (ref 39–117)
BUN: 12 mg/dL (ref 6–23)
CO2: 19 mEq/L (ref 19–32)
Calcium: 9.7 mg/dL (ref 8.4–10.5)
Chloride: 107 mEq/L (ref 96–112)
Glucose, Bld: 107 mg/dL — ABNORMAL HIGH (ref 70–99)
Potassium: 4.1 mEq/L (ref 3.5–5.3)
Sodium: 138 mEq/L (ref 135–145)
Total Protein: 6.9 g/dL (ref 6.0–8.3)

## 2011-07-09 LAB — LACTATE DEHYDROGENASE: LDH: 245 U/L (ref 94–250)

## 2011-07-15 ENCOUNTER — Encounter (HOSPITAL_BASED_OUTPATIENT_CLINIC_OR_DEPARTMENT_OTHER): Payer: Medicare Other | Admitting: Oncology

## 2011-07-15 ENCOUNTER — Other Ambulatory Visit: Payer: Self-pay | Admitting: Oncology

## 2011-07-15 DIAGNOSIS — Z5111 Encounter for antineoplastic chemotherapy: Secondary | ICD-10-CM

## 2011-07-15 DIAGNOSIS — C9110 Chronic lymphocytic leukemia of B-cell type not having achieved remission: Secondary | ICD-10-CM

## 2011-07-15 LAB — CBC WITH DIFFERENTIAL/PLATELET
BASO%: 0.2 % (ref 0.0–2.0)
Eosinophils Absolute: 0.1 10*3/uL (ref 0.0–0.5)
LYMPH%: 7.5 % — ABNORMAL LOW (ref 14.0–49.7)
MCHC: 33.3 g/dL (ref 31.5–36.0)
MONO#: 0.1 10*3/uL (ref 0.1–0.9)
NEUT#: 3.6 10*3/uL (ref 1.5–6.5)
Platelets: 235 10*3/uL (ref 145–400)
RBC: 3.85 10*6/uL (ref 3.70–5.45)
RDW: 16.3 % — ABNORMAL HIGH (ref 11.2–14.5)
WBC: 4.2 10*3/uL (ref 3.9–10.3)
lymph#: 0.3 10*3/uL — ABNORMAL LOW (ref 0.9–3.3)

## 2011-07-22 ENCOUNTER — Encounter (HOSPITAL_COMMUNITY): Payer: Self-pay

## 2011-07-22 ENCOUNTER — Ambulatory Visit (HOSPITAL_COMMUNITY)
Admission: RE | Admit: 2011-07-22 | Discharge: 2011-07-22 | Disposition: A | Discharge status: Home / Self Care | Payer: Medicare Other | Source: Ambulatory Visit | Attending: Oncology | Admitting: Oncology

## 2011-07-22 DIAGNOSIS — C8590 Non-Hodgkin lymphoma, unspecified, unspecified site: Secondary | ICD-10-CM

## 2011-07-22 DIAGNOSIS — D259 Leiomyoma of uterus, unspecified: Secondary | ICD-10-CM | POA: Insufficient documentation

## 2011-07-22 DIAGNOSIS — C8589 Other specified types of non-Hodgkin lymphoma, extranodal and solid organ sites: Secondary | ICD-10-CM | POA: Insufficient documentation

## 2011-07-22 DIAGNOSIS — M948X9 Other specified disorders of cartilage, unspecified sites: Secondary | ICD-10-CM | POA: Insufficient documentation

## 2011-07-22 DIAGNOSIS — N289 Disorder of kidney and ureter, unspecified: Secondary | ICD-10-CM | POA: Insufficient documentation

## 2011-07-22 DIAGNOSIS — J984 Other disorders of lung: Secondary | ICD-10-CM | POA: Insufficient documentation

## 2011-07-22 MED ORDER — IOHEXOL 300 MG/ML  SOLN
100.0000 mL | Freq: Once | INTRAMUSCULAR | Status: AC | PRN
  Administered 2011-07-22: 100 mL via INTRAVENOUS

## 2011-07-23 ENCOUNTER — Other Ambulatory Visit: Payer: Self-pay | Admitting: Oncology

## 2011-07-23 ENCOUNTER — Encounter (HOSPITAL_BASED_OUTPATIENT_CLINIC_OR_DEPARTMENT_OTHER): Payer: Medicare Other | Admitting: Oncology

## 2011-07-23 DIAGNOSIS — C9110 Chronic lymphocytic leukemia of B-cell type not having achieved remission: Secondary | ICD-10-CM

## 2011-07-23 DIAGNOSIS — Z5111 Encounter for antineoplastic chemotherapy: Secondary | ICD-10-CM

## 2011-07-23 DIAGNOSIS — D6481 Anemia due to antineoplastic chemotherapy: Secondary | ICD-10-CM

## 2011-07-23 DIAGNOSIS — T451X5A Adverse effect of antineoplastic and immunosuppressive drugs, initial encounter: Secondary | ICD-10-CM

## 2011-07-23 DIAGNOSIS — Z853 Personal history of malignant neoplasm of breast: Secondary | ICD-10-CM

## 2011-07-23 LAB — CBC WITH DIFFERENTIAL/PLATELET
Eosinophils Absolute: 0.1 10*3/uL (ref 0.0–0.5)
HCT: 34.1 % — ABNORMAL LOW (ref 34.8–46.6)
LYMPH%: 29 % (ref 14.0–49.7)
MONO#: 0.7 10*3/uL (ref 0.1–0.9)
NEUT#: 1.1 10*3/uL — ABNORMAL LOW (ref 1.5–6.5)
NEUT%: 40.2 % (ref 38.4–76.8)
Platelets: 231 10*3/uL (ref 145–400)
WBC: 2.8 10*3/uL — ABNORMAL LOW (ref 3.9–10.3)

## 2011-07-23 LAB — BASIC METABOLIC PANEL
BUN: 7 mg/dL (ref 6–23)
CO2: 26 mEq/L (ref 19–32)
Calcium: 9.4 mg/dL (ref 8.4–10.5)
Creatinine, Ser: 0.81 mg/dL (ref 0.50–1.10)
Glucose, Bld: 89 mg/dL (ref 70–99)

## 2011-08-04 LAB — URINALYSIS, ROUTINE W REFLEX MICROSCOPIC
Bilirubin Urine: NEGATIVE
Glucose, UA: NEGATIVE
Hgb urine dipstick: NEGATIVE
Ketones, ur: NEGATIVE
Nitrite: NEGATIVE
Protein, ur: NEGATIVE
Specific Gravity, Urine: 1.023
Urobilinogen, UA: 0.2
pH: 6.5

## 2011-08-04 LAB — DIFFERENTIAL
Basophils Absolute: 0.1
Basophils Relative: 1
Eosinophils Absolute: 0.1
Eosinophils Relative: 1
Lymphocytes Relative: 28
Lymphs Abs: 4.2 — ABNORMAL HIGH
Monocytes Absolute: 0.7
Monocytes Relative: 4
Neutro Abs: 9.9 — ABNORMAL HIGH
Neutrophils Relative %: 66

## 2011-08-04 LAB — BASIC METABOLIC PANEL
BUN: 10
CO2: 23
CO2: 23
Calcium: 8.6
Chloride: 108
Chloride: 111
Creatinine, Ser: 1.03
GFR calc Af Amer: >60
GFR calc non Af Amer: 53 — ABNORMAL LOW
GFR calc non Af Amer: >60
Glucose, Bld: 92
Glucose, Bld: 99
Potassium: 4.1
Potassium: 4.1
Sodium: 137
Sodium: 138

## 2011-08-04 LAB — CBC
HCT: 19.2 — ABNORMAL LOW
HCT: 28.6 — ABNORMAL LOW
HCT: 29.6 — ABNORMAL LOW
HCT: 32.4 — ABNORMAL LOW
Hemoglobin: 10.5 — ABNORMAL LOW
Hemoglobin: 6 — CL
Hemoglobin: 9.4 — ABNORMAL LOW
MCHC: 31.5
MCHC: 32.9
MCV: 74.7 — ABNORMAL LOW
MCV: 79.9
Platelets: 336
Platelets: 426 — ABNORMAL HIGH
RBC: 2.57 — ABNORMAL LOW
RBC: 4.12
RDW: 15.5
RDW: 18.2 — ABNORMAL HIGH
RDW: 18.2 — ABNORMAL HIGH
WBC: 15.1 — ABNORMAL HIGH

## 2011-08-04 LAB — COMPREHENSIVE METABOLIC PANEL
ALT: 17
Albumin: 3.2 — ABNORMAL LOW
Alkaline Phosphatase: 53
BUN: 12
Potassium: 3.6
Sodium: 136
Total Protein: 8

## 2011-08-04 LAB — MAGNESIUM: Magnesium: 2.1

## 2011-08-04 LAB — PROTIME-INR
INR: 1.1
Prothrombin Time: 14.4

## 2011-08-04 LAB — CROSSMATCH
ABO/RH(D): O POS
Antibody Screen: NEGATIVE

## 2011-08-04 LAB — COMPREHENSIVE METABOLIC PANEL WITH GFR
AST: 27
CO2: 22
Calcium: 9.2
Chloride: 107
Creatinine, Ser: 0.87
GFR calc Af Amer: >60
GFR calc non Af Amer: >60
Glucose, Bld: 124 — ABNORMAL HIGH
Total Bilirubin: 0.5

## 2011-08-04 LAB — IRON AND TIBC
Iron: 66
Saturation Ratios: 20
TIBC: 338

## 2011-08-04 LAB — APTT: aPTT: 30

## 2011-08-04 LAB — CULTURE, BLOOD (ROUTINE X 2): Culture: NO GROWTH

## 2011-08-04 LAB — URINE MICROSCOPIC-ADD ON

## 2011-08-04 LAB — CALCIUM: Calcium: 8.6

## 2011-08-04 LAB — FERRITIN: Ferritin: 6 — ABNORMAL LOW (ref 10–291)

## 2011-08-04 LAB — LACTATE DEHYDROGENASE: LDH: 152

## 2011-08-04 LAB — ABO/RH: ABO/RH(D): O POS

## 2011-08-04 LAB — CANCER ANTIGEN 27.29: CA 27.29: 42 — ABNORMAL HIGH

## 2011-08-05 LAB — CBC
Hemoglobin: 8.8 — ABNORMAL LOW
Hemoglobin: 8.9 — ABNORMAL LOW
MCHC: 32.5
Platelets: 330
RBC: 3.38 — ABNORMAL LOW
RDW: 19.3 — ABNORMAL HIGH

## 2011-08-05 LAB — CROSSMATCH
ABO/RH(D): O POS
Antibody Screen: NEGATIVE

## 2011-08-08 LAB — GLUCOSE, CAPILLARY: Glucose-Capillary: 91 mg/dL (ref 70–99)

## 2011-08-23 ENCOUNTER — Other Ambulatory Visit: Payer: Self-pay | Admitting: Oncology

## 2011-08-23 DIAGNOSIS — C50919 Malignant neoplasm of unspecified site of unspecified female breast: Secondary | ICD-10-CM

## 2011-08-23 DIAGNOSIS — C8590 Non-Hodgkin lymphoma, unspecified, unspecified site: Secondary | ICD-10-CM

## 2011-09-09 ENCOUNTER — Encounter: Payer: Self-pay | Admitting: Oncology

## 2011-09-09 ENCOUNTER — Other Ambulatory Visit: Payer: Self-pay | Admitting: Oncology

## 2011-09-09 ENCOUNTER — Encounter: Payer: Medicare Other | Admitting: Oncology

## 2011-09-09 ENCOUNTER — Other Ambulatory Visit (HOSPITAL_BASED_OUTPATIENT_CLINIC_OR_DEPARTMENT_OTHER): Payer: Medicare Other | Admitting: Lab

## 2011-09-09 VITALS — BP 138/79 | HR 73 | Temp 98.3°F | Ht 62.0 in | Wt 140.4 lb

## 2011-09-09 DIAGNOSIS — C8300 Small cell B-cell lymphoma, unspecified site: Secondary | ICD-10-CM

## 2011-09-09 DIAGNOSIS — C8590 Non-Hodgkin lymphoma, unspecified, unspecified site: Secondary | ICD-10-CM

## 2011-09-09 DIAGNOSIS — C9110 Chronic lymphocytic leukemia of B-cell type not having achieved remission: Secondary | ICD-10-CM

## 2011-09-09 DIAGNOSIS — C50919 Malignant neoplasm of unspecified site of unspecified female breast: Secondary | ICD-10-CM

## 2011-09-09 HISTORY — DX: Non-Hodgkin lymphoma, unspecified, unspecified site: C85.90

## 2011-09-09 LAB — CBC WITH DIFFERENTIAL/PLATELET
Eosinophils Absolute: 0.2 10*3/uL (ref 0.0–0.5)
MONO#: 0.8 10*3/uL (ref 0.1–0.9)
NEUT#: 3.6 10*3/uL (ref 1.5–6.5)
RBC: 3.91 10*6/uL (ref 3.70–5.45)
RDW: 17 % — ABNORMAL HIGH (ref 11.2–14.5)
WBC: 5.2 10*3/uL (ref 3.9–10.3)

## 2011-09-09 LAB — BASIC METABOLIC PANEL
CO2: 24 mEq/L (ref 19–32)
Glucose, Bld: 88 mg/dL (ref 70–99)
Potassium: 4 mEq/L (ref 3.5–5.3)
Sodium: 140 mEq/L (ref 135–145)

## 2011-09-09 NOTE — Progress Notes (Signed)
This encounter is erroneous, disregard

## 2011-09-09 NOTE — Progress Notes (Signed)
Hematology and Oncology Follow Up Visit  Helen [name] [id number] 1935/07/24 76 y.o. 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  #2 metastatic breast cancer diagnosed originally in October 2009.[name] She is currently on letrozole 2.5 mg daily.   Current therapy: #1 letrozole 2.5 mg daily.  Past therapy:  #1 she is status post 6 cycles of Rituxan and CVP on 07/09/2011 for her low-grade non-Hodgkin lymphoma.  #2 she underwent a mastectomy after receiving neoadjuvant chemotherapy and radiation therapy.    Interim History:  Patient is seen in followup today overall she seems to be doing well. She did has developed some achiness which is ongoing. She was taking Celebrex and she discontinued it on her on. She otherwise denies any nausea vomiting fevers chills night sweats she has not noticed any masses. She does complain of some soreness in her breasts.  Medications: I have reviewed the patient's current medications.  Allergies:  Allergies  Allergen Reactions  . Codeine   . Penicillins   . Sulfonamide Derivatives   . Tramadol Hcl     REACTION: rash    Past Medical History, Surgical history, Social history, and Family History were reviewed and updated.  Review of Systems: Constitutional:  Negative for fever, chills, night sweats, anorexia, weight loss, pain. Cardiovascular: no chest pain or dyspnea on exertion Respiratory: no cough, shortness of breath, or wheezing Neurological: no TIA or stroke symptoms Dermatological: negative ENT: negative Skin Gastrointestinal: no abdominal pain, change in bowel habits, or black or bloody stools Genito-Urinary: no dysuria, trouble voiding, or hematuria Hematological and Lymphatic: negative Breast: negative for breast lumps She has a well-healed surgical scar and no noted nodularity over the skin. The left side. Musculoskeletal: negative Remaining ROS  negative.  Physical Exam:  Blood pressure 138/79, pulse 73, temperature 98.3 F (36.8 C), height 5\' 2"  (1.575 m), weight 140 lb 6.4 oz (63.685 kg).  ECOG: 1   General appearance: alert, cooperative and appears stated age Eyes: conjunctivae/corneas clear. PERRL, EOM's intact. Fundi benign. Nose: Nares normal. Septum midline. Mucosa normal. No drainage or sinus tenderness. Throat: lips, mucosa, and tongue normal; teeth and gums normal Neck: no adenopathy, no carotid bruit, no JVD, supple, symmetrical, trachea midline and thyroid not enlarged, symmetric, no tenderness/mass/nodules Back: symmetric, no curvature. ROM normal. No CVA tenderness. Resp: clear to auscultation bilaterally and normal percussion bilaterally Chest wall: no tenderness Breasts: normal appearance, no masses or tenderness, No nipple retraction or dimpling, No nipple discharge or bleeding, No axillary or supraclavicular adenopathy, Left chest wall anteriorly has a well-healed mastectomy scar no nodularity no nipple discharge Cardio: regular rate and rhythm, S1, S2 normal, no murmur, click, rub or gallop GI: soft, non-tender; bowel sounds normal; no masses,  no organomegaly Extremities: extremities normal, atraumatic, no cyanosis or edema Lymph nodes: Cervical, supraclavicular, and axillary nodes normal. Neurologic: Alert and oriented X 3, normal strength and tone. Normal symmetric reflexes. Normal coordination and gait Motor: grossly normal Reflexes: normal   Lab Results: Lab Results  Component Value Date   WBC 10.8* 03/11/2011   HGB 10.4* 09/09/2011   HCT 31.7* 09/09/2011   MCV 80.9 09/09/2011   PLT 284 09/09/2011     Chemistry      Component Value Date/Time   NA 141 07/23/2011 1333   NA 136 08/14/2010 1251   K 4.0 07/23/2011 1333   K 3.9 08/14/2010 1251   CL 105 07/23/2011 1333   CL 102  08/14/2010 1251   CO2 26 07/23/2011 1333   CO2 28 08/14/2010 1251   BUN 7 07/23/2011 1333   BUN 12 08/14/2010 1251    CREATININE 0.81 07/23/2011 1333   CREATININE 0.6 08/14/2010 1251      Component Value Date/Time   CALCIUM 9.4 07/23/2011 1333   CALCIUM 9.7 08/14/2010 1251   ALKPHOS 57 07/09/2011 1018   ALKPHOS 57 07/09/2011 1018   ALKPHOS 78 08/14/2010 1251   AST 17 07/09/2011 1018   AST 17 07/09/2011 1018   AST 19 08/14/2010 1251   ALT 12 07/09/2011 1018   ALT 12 07/09/2011 1018   BILITOT 0.3 07/09/2011 1018   BILITOT 0.3 07/09/2011 1018   BILITOT 0.40 08/14/2010 1251       Radiological Studies:  No results found.   IMPRESSIONS AND PLAN: A 76 y.o. female with   Stage IV low grade B cell non-Hodgkin lymphoma. She is status post 6 cycles of R. CVP she tolerated the treatment amazingly well. She has had staging studies performed that looked normal without evidence of residual lymphoma.  #2 patient will now begin maintenance Rituxan. Starting in January 2013. The orders are placed in the in the treatment plan.She understands the Risks and benefits of this.  #3 cancer she will continue to take letrozole 2.5 mg daily.  #4 aches and pains I have encouraged the patient to take Tylenol at least to try to help this. She is also encouraged to continue taking her Celebrex as she was prescribed. 5 she knows to call me with any problems questions or concerns. She will return in January for followup. She will also need a Port-A-Cath flush in the next few days.   Spent half hour with patient, greater then 50% of the time spent in counseling and coordination of care.   Drue Second, MD Medical/Oncology [facility] [phone] (beeper) [phone] (Office)  09/09/2011, 1:27 PM 11/6/20121:27 PM

## 2011-09-09 NOTE — Progress Notes (Signed)
Addended by: Victorino December on: 09/09/2011 01:41 PM   Modules accepted: Orders, Level of Service, SmartSet

## 2011-09-29 ENCOUNTER — Encounter: Payer: Self-pay | Admitting: Internal Medicine

## 2011-09-29 ENCOUNTER — Ambulatory Visit (INDEPENDENT_AMBULATORY_CARE_PROVIDER_SITE_OTHER): Payer: Medicare Other | Admitting: Internal Medicine

## 2011-09-29 VITALS — BP 128/70 | HR 66 | Temp 97.0°F | Resp 16 | Wt 143.0 lb

## 2011-09-29 DIAGNOSIS — J019 Acute sinusitis, unspecified: Secondary | ICD-10-CM | POA: Insufficient documentation

## 2011-09-29 DIAGNOSIS — Z23 Encounter for immunization: Secondary | ICD-10-CM

## 2011-09-29 MED ORDER — AZITHROMYCIN 500 MG PO TABS: 500.0000 mg | ORAL_TABLET | Freq: Every day | ORAL | Status: AC

## 2011-09-29 NOTE — Progress Notes (Signed)
Subjective:    Patient ID: Helen [name], female    DOB: [birth year], 76 y.o.   MRN: [id number]  URI  This is a new problem. The current episode started in the past 7 days. The problem has been gradually worsening. There has been no fever. Associated symptoms include congestion, rhinorrhea and sinus pain. Pertinent negatives include no abdominal pain, chest pain, coughing, diarrhea, dysuria, ear pain, headaches, joint pain, joint swelling, nausea, neck pain, plugged ear sensation, rash, sneezing, sore throat, swollen glands, vomiting or wheezing. She has tried nothing for the symptoms.      Review of Systems  Constitutional: Negative for fever, chills, diaphoresis, activity change, appetite change, fatigue and unexpected weight change.  HENT: Positive for congestion, rhinorrhea, postnasal drip and sinus pressure. Negative for hearing loss, ear pain, nosebleeds, sore throat, facial swelling, sneezing, drooling, mouth sores, trouble swallowing, neck pain, neck stiffness, dental problem, voice change, tinnitus and ear discharge.   Eyes: Negative.   Respiratory: Negative for cough, shortness of breath, wheezing and stridor.   Cardiovascular: Negative for chest pain, palpitations and leg swelling.  Gastrointestinal: Negative for nausea, vomiting, abdominal pain, diarrhea and constipation.  Genitourinary: Negative for dysuria, urgency, frequency, hematuria, decreased urine volume, enuresis, difficulty urinating and dyspareunia.  Musculoskeletal: Negative for myalgias, back pain, joint pain, joint swelling, arthralgias and gait problem.  Skin: Negative for color change, pallor, rash and wound.  Neurological: Negative for dizziness, tremors, seizures, syncope, facial asymmetry, speech difficulty, weakness, light-headedness, numbness and headaches.  Hematological: Negative for adenopathy. Does not bruise/bleed easily.  Psychiatric/Behavioral: Negative.        Objective:   Physical Exam  Vitals  reviewed. Constitutional: She is oriented to person, place, and time. She appears well-developed and well-nourished. No distress.  HENT:  Head: Normocephalic and atraumatic.  Right Ear: Hearing, tympanic membrane, external ear and ear canal normal.  Left Ear: Hearing, tympanic membrane, external ear and ear canal normal.  Nose: No mucosal edema, rhinorrhea, nose lacerations, sinus tenderness, nasal deformity, septal deviation or nasal septal hematoma. No epistaxis.  No foreign bodies. Right sinus exhibits maxillary sinus tenderness. Right sinus exhibits no frontal sinus tenderness. Left sinus exhibits maxillary sinus tenderness. Left sinus exhibits no frontal sinus tenderness.  Mouth/Throat: Uvula is midline and oropharynx is clear and moist. Mucous membranes are not pale, not dry and not cyanotic. No oropharyngeal exudate, posterior oropharyngeal edema, posterior oropharyngeal erythema or tonsillar abscesses.  Eyes: Conjunctivae are normal. Right eye exhibits no discharge. Left eye exhibits no discharge. No scleral icterus.  Neck: Normal range of motion. Neck supple. No JVD present. No tracheal deviation present. No thyromegaly present.  Cardiovascular: Normal rate, regular rhythm, normal heart sounds and intact distal pulses.  Exam reveals no gallop and no friction rub.   No murmur heard. Pulmonary/Chest: Effort normal and breath sounds normal. No respiratory distress. She has no wheezes. She has no rales. She exhibits no tenderness.  Abdominal: Soft. Bowel sounds are normal. She exhibits no distension. There is no tenderness. There is no rebound and no guarding.  Musculoskeletal: Normal range of motion. She exhibits no edema and no tenderness.  Lymphadenopathy:    She has no cervical adenopathy.  Neurological: She is oriented to person, place, and time.  Skin: Skin is warm and dry. No rash noted. She is not diaphoretic. No erythema. No pallor.  Psychiatric: She has a normal mood and affect. Her  behavior is normal. Judgment and thought content normal.     Lab Results  Component Value  Date   WBC 5.2 09/09/2011   HGB 10.4* 09/09/2011   HCT 31.7* 09/09/2011   PLT 284 09/09/2011   GLUCOSE 88 09/09/2011   GLUCOSE 88 09/09/2011   ALT 12 07/09/2011   ALT 12 07/09/2011   AST 17 07/09/2011   AST 17 07/09/2011   NA 140 09/09/2011   NA 140 09/09/2011   K 4.0 09/09/2011   K 4.0 09/09/2011   CL 106 09/09/2011   CL 106 09/09/2011   CREATININE 0.72 09/09/2011   CREATININE 0.72 09/09/2011   BUN 14 09/09/2011   BUN 14 09/09/2011   CO2 24 09/09/2011   CO2 24 09/09/2011   TSH 2.96 01/17/2011   INR 0.97 03/11/2011       Assessment & Plan:

## 2011-09-29 NOTE — Patient Instructions (Signed)

## 2011-09-29 NOTE — Assessment & Plan Note (Signed)
Start zpak for the infection 

## 2011-09-30 ENCOUNTER — Ambulatory Visit: Payer: Medicare Other

## 2011-09-30 ENCOUNTER — Other Ambulatory Visit: Payer: Self-pay | Admitting: Oncology

## 2011-10-01 ENCOUNTER — Telehealth: Payer: Self-pay | Admitting: Oncology

## 2011-10-01 NOTE — Telephone Encounter (Signed)
called pts l[facility] for appt on jan2013 and to rtn call to confirm appts

## 2011-10-01 NOTE — Telephone Encounter (Signed)
called pt lmov[facility] to rtn call to confirm appt for jan2013

## 2011-10-20 ENCOUNTER — Ambulatory Visit (INDEPENDENT_AMBULATORY_CARE_PROVIDER_SITE_OTHER): Payer: Medicare Other | Admitting: Internal Medicine

## 2011-10-20 ENCOUNTER — Encounter: Payer: Self-pay | Admitting: Internal Medicine

## 2011-10-20 DIAGNOSIS — D6489 Other specified anemias: Secondary | ICD-10-CM

## 2011-10-20 DIAGNOSIS — J309 Allergic rhinitis, unspecified: Secondary | ICD-10-CM

## 2011-10-20 NOTE — Patient Instructions (Signed)
Anemia, Nonspecific Your exam and blood tests show you are anemic. This means your blood (hemoglobin) level is low. Normal hemoglobin values are 12 to 15 g/dL for females and 14 to 17 g/dL for males. Make a note of your hemoglobin level today. The hematocrit percent is also used to measure anemia. A normal hematocrit is 38% to 46% in females and 42% to 49% in males. Make a note of your hematocrit level today. CAUSES  Anemia can be due to many different causes.  Excessive bleeding from periods (in women).   Intestinal bleeding.   Poor nutrition.   Kidney, thyroid, liver, and bone marrow diseases.  SYMPTOMS  Anemia can come on suddenly (acute). It can also come on slowly. Symptoms can include:  Minor weakness.   Dizziness.   Palpitations.   Shortness of breath.  Symptoms may be absent until half your hemoglobin is missing if it comes on slowly. Anemia due to acute blood loss from an injury or internal bleeding may require blood transfusion if the loss is severe. Hospital care is needed if you are anemic and there is significant continual blood loss. TREATMENT   Stool tests for blood (Hemoccult) and additional lab tests are often needed. This determines the best treatment.   Further checking on your condition and your response to treatment is very important. It often takes many weeks to correct anemia.  Depending on the cause, treatment can include:  Supplements of iron.   Vitamins B12 and folic acid.   Hormone medicines.If your anemia is due to bleeding, finding the cause of the blood loss is very important. This will help avoid further problems.  SEEK IMMEDIATE MEDICAL CARE IF:   You develop fainting, extreme weakness, shortness of breath, or chest pain.   You develop heavy vaginal bleeding.   You develop bloody or black, tarry stools or vomit up blood.   You develop a high fever, rash, repeated vomiting, or dehydration.  Document Released: 11/27/2004 Document Revised:  07/02/2011 Document Reviewed: 09/04/2009 [employer] Patient Information 2012 [employer]. 

## 2011-10-20 NOTE — Progress Notes (Signed)
Subjective:    Patient ID: Helen [name], female    DOB: 1935-07-18, 76 y.o.   MRN: [id number]  Anemia Presents for follow-up visit. There has been no abdominal pain, anorexia, bruising/bleeding easily, confusion, fever, leg swelling, light-headedness, malaise/fatigue, pallor, palpitations, paresthesias, pica or weight loss. Signs of blood loss that are not present include hematemesis, hematochezia, melena and vaginal bleeding. There are no compliance problems.       Review of Systems  Constitutional: Negative for fever, weight loss and malaise/fatigue.  HENT: Positive for congestion, rhinorrhea, sneezing and postnasal drip. Negative for hearing loss, ear pain, nosebleeds, sore throat, facial swelling, drooling, mouth sores, trouble swallowing, neck pain, neck stiffness, dental problem, voice change, sinus pressure, tinnitus and ear discharge.   Eyes: Negative.   Respiratory: Negative for cough, chest tightness, shortness of breath, wheezing and stridor.   Cardiovascular: Negative for chest pain, palpitations and leg swelling.  Gastrointestinal: Negative for nausea, vomiting, abdominal pain, diarrhea, constipation, blood in stool, melena, hematochezia, anorexia and hematemesis.  Genitourinary: Negative.  Negative for vaginal bleeding.  Musculoskeletal: Negative for myalgias, back pain, joint swelling, arthralgias and gait problem.  Skin: Negative for color change, pallor, rash and wound.  Neurological: Negative for dizziness, tremors, seizures, syncope, facial asymmetry, speech difficulty, weakness, light-headedness, numbness, headaches and paresthesias.  Hematological: Negative for adenopathy. Does not bruise/bleed easily.  Psychiatric/Behavioral: Negative.  Negative for confusion.       Objective:   Physical Exam  Vitals reviewed. Constitutional: She is oriented to person, place, and time. She appears well-developed and well-nourished. No distress.  HENT:  Head: Normocephalic and  atraumatic.  Right Ear: Hearing, tympanic membrane, external ear and ear canal normal.  Left Ear: Hearing, tympanic membrane, external ear and ear canal normal.  Nose: Mucosal edema and rhinorrhea present. No nose lacerations, sinus tenderness, nasal deformity, septal deviation or nasal septal hematoma. No epistaxis.  No foreign bodies. Right sinus exhibits no maxillary sinus tenderness and no frontal sinus tenderness. Left sinus exhibits no maxillary sinus tenderness and no frontal sinus tenderness.  Mouth/Throat: Oropharynx is clear and moist and mucous membranes are normal. Mucous membranes are not pale, not dry and not cyanotic. No oropharyngeal exudate, posterior oropharyngeal edema, posterior oropharyngeal erythema or tonsillar abscesses.  Eyes: Conjunctivae are normal. Right eye exhibits no discharge. Left eye exhibits no discharge. No scleral icterus.  Neck: Normal range of motion. Neck supple. No JVD present. No tracheal deviation present. No thyromegaly present.  Cardiovascular: Normal rate, regular rhythm, normal heart sounds and intact distal pulses.  Exam reveals no gallop and no friction rub.   No murmur heard. Pulmonary/Chest: Effort normal and breath sounds normal. No stridor. No respiratory distress. She has no wheezes. She has no rales. She exhibits no tenderness.  Abdominal: Soft. Bowel sounds are normal. She exhibits no distension and no mass. There is no tenderness. There is no rebound and no guarding.  Musculoskeletal: Normal range of motion. She exhibits no edema.  Lymphadenopathy:    She has no cervical adenopathy.  Neurological: She is oriented to person, place, and time.  Skin: Skin is warm and dry. No rash noted. She is not diaphoretic. No erythema. No pallor.  Psychiatric: She has a normal mood and affect. Her behavior is normal. Judgment and thought content normal.      Lab Results  Component Value Date   WBC 5.2 09/09/2011   HGB 10.4* 09/09/2011   HCT 31.7*  09/09/2011   PLT 284 09/09/2011   GLUCOSE 88 09/09/2011  GLUCOSE 88 09/09/2011   ALT 12 07/09/2011   ALT 12 07/09/2011   AST 17 07/09/2011   AST 17 07/09/2011   NA 140 09/09/2011   NA 140 09/09/2011   K 4.0 09/09/2011   K 4.0 09/09/2011   CL 106 09/09/2011   CL 106 09/09/2011   CREATININE 0.72 09/09/2011   CREATININE 0.72 09/09/2011   BUN 14 09/09/2011   BUN 14 09/09/2011   CO2 24 09/09/2011   CO2 24 09/09/2011   TSH 2.96 01/17/2011   INR 0.97 03/11/2011      Assessment & Plan:

## 2011-10-20 NOTE — Assessment & Plan Note (Signed)
Continue zyrtec as needed

## 2011-10-20 NOTE — Assessment & Plan Note (Signed)
This is stable 

## 2011-10-21 ENCOUNTER — Ambulatory Visit: Payer: Medicare Other

## 2011-11-06 ENCOUNTER — Other Ambulatory Visit: Payer: Self-pay | Admitting: Physician Assistant

## 2011-11-11 ENCOUNTER — Telehealth: Payer: Self-pay | Admitting: Oncology

## 2011-11-11 NOTE — Telephone Encounter (Signed)
called pt and inform her that Dr. Welton Flakes cancelled appts on 01/14 and to come to her next appt  on 01/21

## 2011-11-17 ENCOUNTER — Ambulatory Visit: Payer: Medicare Other

## 2011-11-17 ENCOUNTER — Other Ambulatory Visit: Payer: Medicare Other | Admitting: Lab

## 2011-11-17 ENCOUNTER — Ambulatory Visit: Payer: Medicare Other | Admitting: Physician Assistant

## 2011-11-18 ENCOUNTER — Ambulatory Visit: Payer: Medicare Other | Admitting: Family

## 2011-11-18 ENCOUNTER — Other Ambulatory Visit: Payer: Medicare Other | Admitting: Lab

## 2011-11-18 ENCOUNTER — Ambulatory Visit: Payer: Medicare Other

## 2011-11-24 ENCOUNTER — Other Ambulatory Visit: Payer: Medicare Other | Admitting: Lab

## 2011-11-24 ENCOUNTER — Ambulatory Visit (HOSPITAL_BASED_OUTPATIENT_CLINIC_OR_DEPARTMENT_OTHER): Payer: Medicare Other | Admitting: Family

## 2011-11-24 ENCOUNTER — Encounter: Payer: Self-pay | Admitting: Family

## 2011-11-24 ENCOUNTER — Ambulatory Visit: Payer: Medicare Other | Admitting: Family

## 2011-11-24 ENCOUNTER — Ambulatory Visit (HOSPITAL_BASED_OUTPATIENT_CLINIC_OR_DEPARTMENT_OTHER): Payer: Medicare Other

## 2011-11-24 VITALS — BP 122/66 | HR 64 | Temp 97.6°F

## 2011-11-24 DIAGNOSIS — C8590 Non-Hodgkin lymphoma, unspecified, unspecified site: Secondary | ICD-10-CM

## 2011-11-24 DIAGNOSIS — Z5111 Encounter for antineoplastic chemotherapy: Secondary | ICD-10-CM

## 2011-11-24 DIAGNOSIS — C8589 Other specified types of non-Hodgkin lymphoma, extranodal and solid organ sites: Secondary | ICD-10-CM

## 2011-11-24 DIAGNOSIS — Z853 Personal history of malignant neoplasm of breast: Secondary | ICD-10-CM

## 2011-11-24 DIAGNOSIS — Z5112 Encounter for antineoplastic immunotherapy: Secondary | ICD-10-CM

## 2011-11-24 DIAGNOSIS — C9110 Chronic lymphocytic leukemia of B-cell type not having achieved remission: Secondary | ICD-10-CM

## 2011-11-24 LAB — CBC WITH DIFFERENTIAL/PLATELET
BASO%: 0.5 % (ref 0.0–2.0)
Basophils Absolute: 0 10*3/uL (ref 0.0–0.1)
HCT: 38.4 % (ref 34.8–46.6)
HGB: 12.2 g/dL (ref 11.6–15.9)
LYMPH%: 9.7 % — ABNORMAL LOW (ref 14.0–49.7)
MCHC: 31.8 g/dL (ref 31.5–36.0)
MONO#: 0.6 10*3/uL (ref 0.1–0.9)
NEUT%: 74 % (ref 38.4–76.8)
Platelets: 185 10*3/uL (ref 145–400)
WBC: 5.8 10*3/uL (ref 3.9–10.3)

## 2011-11-24 MED ORDER — SODIUM CHLORIDE 0.9 % IV SOLN
375.0000 mg/m2 | Freq: Once | INTRAVENOUS | Status: AC
  Administered 2011-11-24: 600 mg via INTRAVENOUS
  Filled 2011-11-24: qty 60

## 2011-11-24 MED ORDER — SODIUM CHLORIDE 0.9 % IJ SOLN
10.0000 mL | INTRAMUSCULAR | Status: DC | PRN
  Administered 2011-11-24: 10 mL
  Filled 2011-11-24: qty 10

## 2011-11-24 MED ORDER — HEPARIN SOD (PORK) LOCK FLUSH 100 UNIT/ML IV SOLN
500.0000 [IU] | Freq: Once | INTRAVENOUS | Status: AC | PRN
  Administered 2011-11-24: 500 [IU]
  Filled 2011-11-24: qty 5

## 2011-11-24 MED ORDER — ACETAMINOPHEN 325 MG PO TABS
650.0000 mg | ORAL_TABLET | Freq: Once | ORAL | Status: AC
  Administered 2011-11-24: 650 mg via ORAL

## 2011-11-24 MED ORDER — SODIUM CHLORIDE 0.9 % IV SOLN
Freq: Once | INTRAVENOUS | Status: AC
  Administered 2011-11-24: 11:00:00 via INTRAVENOUS

## 2011-11-24 MED ORDER — DIPHENHYDRAMINE HCL 25 MG PO CAPS
50.0000 mg | ORAL_CAPSULE | Freq: Once | ORAL | Status: AC
  Administered 2011-11-24: 50 mg via ORAL

## 2011-11-24 NOTE — Patient Instructions (Signed)
Pt aware of future appts, no complaints, pt ambulatory. SLJ

## 2011-11-24 NOTE — Progress Notes (Signed)
[facility]  Name: Helen [name]                  DATE: 11/24/2011 MRN: [id number]                      DOB: Dec 01, 1934  CC: [name], MD          REFERRING PHYSICIAN: [name], MD  DIAGNOSIS: Patient Active Problem List  Diagnoses Date Noted  . Lymphoma, non-Hodgkin's, low grade 09/09/2011  . POSTSURGICAL HYPOTHYROIDISM 11/18/2010  . ALLERGIC RHINITIS 09/16/2010  . OTHER SPECIFIED ANEMIAS 08/16/2009  . DEGENERATIVE JOINT DISEASE, BOTH KNEES, SEVERE 05/24/2009  . ASTEATOTIC ECZEMA 04/09/2009  . BREAST CANCER, HX OF 04/09/2009     Encounter Diagnoses  Name Primary?  . Lymphoma, non Hodgkin's   . History of breast cancer, diagnosed October 2009.      PREVIOUS THERAPY: CVP with Rituxan.  CURRENT THERAPY: Maintenance Rituxan, letrozole 2.5 mg daily.  INTERIM HISTORY: Patient is unclear about the purpose of today's visit. She is here to begin maintenance Rituxan, plan is for 4 weekly treatments, this will be done every 6 months for a total of 4 cycles.  Chief complaint today is of osteoarthritis and diffuse arthralgias related to anti-estrogen therapy. Also has a diagnosis of degenerative joint disease, bilateral knees. Takes Celebrex when necessary. Occasional hot flashes, no sweats. Appetite is good. 2 pound weight loss since last visit. Bowel and bladder function are normal.  PHYSICAL EXAM: BP 134/74  Pulse 71  Temp(Src) 97.8 F (36.6 C) (Oral)  Ht 5\' 2"  (1.575 m)  Wt 138 lb 6.4 oz (62.778 kg)  BMI 25.31 kg/m2 General: Well developed, well nourished, in no acute distress.  EENT: No ocular or oral lesions. No stomatitis.  Respiratory: Lungs are clear to auscultation bilaterally with normal respiratory movement and no accessory muscle use. Cardiac: No murmur, rub or tachycardia. No upper or lower extremity edema.  GI: Abdomen is soft, no palpable hepatosplenomegaly. No fluid wave. No tenderness. Musculoskeletal: No kyphosis, no tenderness over the  spine, ribs or hips. Lymph: No cervical, infraclavicular, axillary or inguinal adenopathy. Neuro: No focal neurological deficits. Psych: Alert, appropriate mood and affect. Appears somewhat forgetful, repeating questions. Questions show that she is unclear about the details of her disease and treatment plan.  LABORATORY STUDIES:   Results for orders placed in visit on 11/24/11  CBC WITH DIFFERENTIAL      Component Value Range   WBC 5.8  3.9 - 10.3 (10e3/uL)   NEUT# 4.3  1.5 - 6.5 (10e3/uL)   HGB 12.2  11.6 - 15.9 (g/dL)   HCT 40.9  81.1 - 91.4 (%)   Platelets 185  145 - 400 (10e3/uL)   MCV 81.4  79.5 - 101.0 (fL)   MCH 25.8  25.1 - 34.0 (pg)   MCHC 31.8  31.5 - 36.0 (g/dL)   RBC 7.82  9.56 - 2.13 (10e6/uL)   RDW 15.6 (*) 11.2 - 14.5 (%)   lymph# 0.6 (*) 0.9 - 3.3 (10e3/uL)   MONO# 0.6  0.1 - 0.9 (10e3/uL)   Eosinophils Absolute 0.3  0.0 - 0.5 (10e3/uL)   Basophils Absolute 0.0  0.0 - 0.1 (10e3/uL)   NEUT% 74.0  38.4 - 76.8 (%)   LYMPH% 9.7 (*) 14.0 - 49.7 (%)   MONO% 9.9  0.0 - 14.0 (%)   EOS% 5.9  0.0 - 7.0 (%)   BASO% 0.5  0.0 - 2.0 (%)  nRBC 0  0 - 0 (%)    IMPRESSION:  77 year old [place] American female with: 1. History of left breast cancer, diagnosed 2009, on letrozole 2.5 mg daily with good tolerance. 2. Non-Hodgkin lymphoma, now on maintenance Rituxan beginning today. 3. Prolonged interval since last mammogram, she does not recall mammogram since diagnosis in 2009. I can find no report in the medical record ofa mammogram since that time.  PLAN:   1. Schedule mammogram on next visit. 2. Maintenance Rituxan beginning today. This will be week 1 of a 4 week cycle. At the completion of this four-week cycle she'll have 6 months off and return for cycle 2 of 4 total planned.

## 2011-12-01 ENCOUNTER — Other Ambulatory Visit: Payer: Self-pay | Admitting: Family

## 2011-12-01 ENCOUNTER — Telehealth: Payer: Self-pay | Admitting: Oncology

## 2011-12-01 ENCOUNTER — Other Ambulatory Visit: Payer: Medicare Other | Admitting: Lab

## 2011-12-01 ENCOUNTER — Ambulatory Visit: Payer: Medicare Other | Admitting: Oncology

## 2011-12-01 ENCOUNTER — Ambulatory Visit (HOSPITAL_BASED_OUTPATIENT_CLINIC_OR_DEPARTMENT_OTHER): Payer: Medicare Other

## 2011-12-01 ENCOUNTER — Encounter: Payer: Self-pay | Admitting: Oncology

## 2011-12-01 VITALS — BP 130/68 | HR 67 | Temp 96.8°F

## 2011-12-01 VITALS — BP 155/77 | HR 64 | Temp 97.7°F | Ht 62.0 in | Wt 137.9 lb

## 2011-12-01 DIAGNOSIS — C8589 Other specified types of non-Hodgkin lymphoma, extranodal and solid organ sites: Secondary | ICD-10-CM

## 2011-12-01 DIAGNOSIS — C8590 Non-Hodgkin lymphoma, unspecified, unspecified site: Secondary | ICD-10-CM

## 2011-12-01 DIAGNOSIS — Z5112 Encounter for antineoplastic immunotherapy: Secondary | ICD-10-CM

## 2011-12-01 DIAGNOSIS — Z853 Personal history of malignant neoplasm of breast: Secondary | ICD-10-CM

## 2011-12-01 DIAGNOSIS — C859A Non-Hodgkin lymphoma, unspecified, in remission: Secondary | ICD-10-CM

## 2011-12-01 LAB — CBC WITH DIFFERENTIAL/PLATELET
Basophils Absolute: 0 10*3/uL (ref 0.0–0.1)
Eosinophils Absolute: 0.3 10*3/uL (ref 0.0–0.5)
HCT: 37.1 % (ref 34.8–46.6)
HGB: 11.7 g/dL (ref 11.6–15.9)
LYMPH%: 13.4 % — ABNORMAL LOW (ref 14.0–49.7)
MCHC: 31.5 g/dL (ref 31.5–36.0)
MONO#: 0.6 10*3/uL (ref 0.1–0.9)
NEUT#: 3.3 10*3/uL (ref 1.5–6.5)
NEUT%: 68.8 % (ref 38.4–76.8)
Platelets: 252 10*3/uL (ref 145–400)
WBC: 4.9 10*3/uL (ref 3.9–10.3)

## 2011-12-01 LAB — COMPREHENSIVE METABOLIC PANEL
BUN: 15 mg/dL (ref 6–23)
CO2: 22 mEq/L (ref 19–32)
Calcium: 9.6 mg/dL (ref 8.4–10.5)
Creatinine, Ser: 0.7 mg/dL (ref 0.50–1.10)
Glucose, Bld: 79 mg/dL (ref 70–99)
Total Bilirubin: 0.3 mg/dL (ref 0.3–1.2)

## 2011-12-01 MED ORDER — ACETAMINOPHEN 325 MG PO TABS: 650.0000 mg | ORAL_TABLET | Freq: Once | ORAL | Status: DC

## 2011-12-01 MED ORDER — HEPARIN SOD (PORK) LOCK FLUSH 100 UNIT/ML IV SOLN
500.0000 [IU] | Freq: Once | INTRAVENOUS | Status: AC | PRN
  Administered 2011-12-01: 500 [IU]
  Filled 2011-12-01: qty 5

## 2011-12-01 MED ORDER — SODIUM CHLORIDE 0.9 % IV SOLN
Freq: Once | INTRAVENOUS | Status: AC
  Administered 2011-12-01: 11:00:00 via INTRAVENOUS

## 2011-12-01 MED ORDER — DIPHENHYDRAMINE HCL 25 MG PO CAPS: 50.0000 mg | ORAL_CAPSULE | Freq: Once | ORAL | Status: DC

## 2011-12-01 MED ORDER — SODIUM CHLORIDE 0.9 % IJ SOLN
10.0000 mL | INTRAMUSCULAR | Status: DC | PRN
  Administered 2011-12-01: 10 mL
  Filled 2011-12-01: qty 10

## 2011-12-01 MED ORDER — SODIUM CHLORIDE 0.9 % IV SOLN
375.0000 mg/m2 | Freq: Once | INTRAVENOUS | Status: AC
  Administered 2011-12-01: 600 mg via INTRAVENOUS
  Filled 2011-12-01: qty 60

## 2011-12-01 NOTE — Patient Instructions (Signed)
Pt d/c'd in stable condition. Ambulatory.  States knowledge of next visit next week and verbalized understanding to call for any new problems or concerns prior to visit.  Pt called for her transportation and went to lobby to wait on ride.

## 2011-12-01 NOTE — Telephone Encounter (Signed)
gve the pt her feb 2013 appt calendar °

## 2011-12-01 NOTE — Progress Notes (Signed)
OFFICE PROGRESS NOTE  CC  [name], MD, MD [address] [facility] [address] [place], [address] [place] [address]  DIAGNOSIS: Your-year-old female with:  #1 take left breast carcinoma originally presenting as a fungating mass. Patient underwent neoadjuvant chemotherapy followed by mastectomy. She is currently on letrozole 2.5 mg daily.  #2 low-grade non-Hodgkin lymphoma status post CVP and Rituxan now receiving maintenance Rituxan.  PRIOR THERAPY:  #1 the patient was diagnosed with metastatic breast carcinoma beginning October 2009. She returned her 1 chemotherapy followed by mastectomy radiation. She has been on letrozole 2.5 mg daily tolerating it well.  #2 Was diagnosed with low-grade non-Hodgkin lymphoma  12. She received 6 cycles of CVP and Rituxan. Last week for maintenance Rituxan. She is to receive 4 weeks in a row every 6 months for a total of 2 years.  CURRENT THERAPY:Is here for maintenance Rituxan  Week #2/4  INTERVAL HISTORY: Helen [name] 77 y.o. female returns for Visit today. Overall she is doing well. Patient has become quite forgetful. On her last visit with Colman Cater patient was unclear why she was seeing [place]. She was explained the role of maintenance Rituxan by Colman Cater. But in spite of that patient still remains quite confused and this is quite concerning. She otherwise denies any fevers chills night sweats headaches shortness of breath chest pains palpitations she has no myalgias or arthralgias. He material hematochezia melena hemoptysis or hematemesis. She denies having any peripheral paresthesias no weight problems no gait disturbances. Remainder of the 10 point review of systems is negative.  MEDICAL HISTORY: Past Medical History  Diagnosis Date  . Breast ca     (LT) breast ca dx 10/09  . Lymphoma 09/09/2011    ALLERGIES:  is allergic to codeine; penicillins; sulfonamide derivatives; and tramadol hcl.  MEDICATIONS:  Current Outpatient Prescriptions    Medication Sig Dispense Refill  . celecoxib (CELEBREX) 200 MG capsule Take 200 mg by mouth daily as needed.       . cetirizine (ZYRTEC) 10 MG tablet Take 10 mg by mouth daily.        [name] letrozole (FEMARA) 2.5 MG tablet Take 2.5 mg by mouth as directed.        . triamcinolone (KENALOG) 0.1 % cream Apply topically 2 (two) times daily.          SURGICAL HISTORY:  Past Surgical History  Procedure Date  . Mastectomy   . Thyroidectomy     REVIEW OF SYSTEMS:  Pertinent items are noted in HPI.   PHYSICAL EXAMINATION: General appearance: alert, cooperative and appears stated age Neck: no adenopathy, no carotid bruit, no JVD, supple, symmetrical, trachea midline and thyroid not enlarged, symmetric, no tenderness/mass/nodules Lymph nodes: Cervical, supraclavicular, and axillary nodes normal. Resp: clear to auscultation bilaterally and normal percussion bilaterally Back: symmetric, no curvature. ROM normal. No CVA tenderness. Cardio: regular rate and rhythm, S1, S2 normal, no murmur, click, rub or gallop and normal apical impulse GI: soft, non-tender; bowel sounds normal; no masses,  no organomegaly Extremities: extremities normal, atraumatic, no cyanosis or edema Neurologic: Alert and oriented X 3, normal strength and tone. Normal symmetric reflexes. Normal coordination and gait  ECOG PERFORMANCE STATUS: 1 - Symptomatic but completely ambulatory  Blood pressure 155/77, pulse 64, temperature 97.7 F (36.5 C), temperature source Oral, height 5\' 2"  (1.575 m), weight 137 lb 14.4 oz (62.551 kg).  LABORATORY DATA: Lab Results  Component Value Date   WBC 4.9 12/01/2011   HGB 11.7 12/01/2011  HCT 37.1 12/01/2011   MCV 81.4 12/01/2011   PLT 252 12/01/2011      Chemistry      Component Value Date/Time   NA 140 09/09/2011 1208   NA 140 09/09/2011 1208   NA 136 08/14/2010 1251   K 4.0 09/09/2011 1208   K 4.0 09/09/2011 1208   K 3.9 08/14/2010 1251   CL 106 09/09/2011 1208   CL 106 09/09/2011 1208    CL 102 08/14/2010 1251   CO2 24 09/09/2011 1208   CO2 24 09/09/2011 1208   CO2 28 08/14/2010 1251   BUN 14 09/09/2011 1208   BUN 14 09/09/2011 1208   BUN 12 08/14/2010 1251   CREATININE 0.72 09/09/2011 1208   CREATININE 0.72 09/09/2011 1208   CREATININE 0.6 08/14/2010 1251      Component Value Date/Time   CALCIUM 9.6 09/09/2011 1208   CALCIUM 9.6 09/09/2011 1208   CALCIUM 9.7 08/14/2010 1251   ALKPHOS 57 07/09/2011 1018   ALKPHOS 57 07/09/2011 1018   ALKPHOS 78 08/14/2010 1251   AST 17 07/09/2011 1018   AST 17 07/09/2011 1018   AST 19 08/14/2010 1251   ALT 12 07/09/2011 1018   ALT 12 07/09/2011 1018   BILITOT 0.3 07/09/2011 1018   BILITOT 0.3 07/09/2011 1018   BILITOT 0.40 08/14/2010 1251       RADIOGRAPHIC STUDIES:  No results found.  ASSESSMENT: 77 year old female with  #1 low-grade non-Hodgkin lymphoma patient will proceed with her maintenance Rituxan.  #2 metastatic breast carcinoma originally diagnosed in 2009 now maintained on letrozole 2.5 mg daily.   PLAN: 1 patient will proceed with her scheduled Rituxan.  #2 we will go ahead and set her up for a mammogram of the right breast.  #3 patient will be seen back in one time.   All questions were answered. The patient knows to call the clinic with any problems, questions or concerns. We can certainly see the patient much sooner if necessary.  I spent 20 minutes counseling the patient face to face. The total time spent in the appointment was 30 minutes.    Drue Second, MD Medical/Oncology [facility] [phone] (beeper) [phone] (Office)  12/01/2011, 10:38 AM

## 2011-12-01 NOTE — Telephone Encounter (Signed)
lmonvm adviisng the pt of her mammo appt in feb

## 2011-12-03 ENCOUNTER — Other Ambulatory Visit: Payer: Self-pay | Admitting: *Deleted

## 2011-12-03 ENCOUNTER — Other Ambulatory Visit: Payer: Self-pay | Admitting: Oncology

## 2011-12-03 MED ORDER — LETROZOLE 2.5 MG PO TABS: 2.5000 mg | ORAL_TABLET | Freq: Every day | ORAL | Status: DC

## 2011-12-08 ENCOUNTER — Ambulatory Visit (HOSPITAL_BASED_OUTPATIENT_CLINIC_OR_DEPARTMENT_OTHER): Payer: Medicare Other | Admitting: Family

## 2011-12-08 ENCOUNTER — Ambulatory Visit (HOSPITAL_BASED_OUTPATIENT_CLINIC_OR_DEPARTMENT_OTHER): Payer: Medicare Other

## 2011-12-08 ENCOUNTER — Other Ambulatory Visit: Payer: Medicare Other | Admitting: Lab

## 2011-12-08 ENCOUNTER — Encounter: Payer: Self-pay | Admitting: Family

## 2011-12-08 VITALS — BP 157/74 | HR 64 | Temp 96.3°F

## 2011-12-08 VITALS — BP 149/76 | HR 64 | Temp 97.9°F | Ht 62.0 in | Wt 139.4 lb

## 2011-12-08 DIAGNOSIS — C8589 Other specified types of non-Hodgkin lymphoma, extranodal and solid organ sites: Secondary | ICD-10-CM

## 2011-12-08 DIAGNOSIS — C8590 Non-Hodgkin lymphoma, unspecified, unspecified site: Secondary | ICD-10-CM

## 2011-12-08 DIAGNOSIS — Z5112 Encounter for antineoplastic immunotherapy: Secondary | ICD-10-CM

## 2011-12-08 DIAGNOSIS — M2550 Pain in unspecified joint: Secondary | ICD-10-CM

## 2011-12-08 DIAGNOSIS — Z853 Personal history of malignant neoplasm of breast: Secondary | ICD-10-CM

## 2011-12-08 LAB — COMPREHENSIVE METABOLIC PANEL
AST: 21 U/L (ref 0–37)
Albumin: 4.3 g/dL (ref 3.5–5.2)
Alkaline Phosphatase: 73 U/L (ref 39–117)
Calcium: 9.5 mg/dL (ref 8.4–10.5)
Chloride: 107 mEq/L (ref 96–112)
Glucose, Bld: 88 mg/dL (ref 70–99)
Potassium: 3.9 mEq/L (ref 3.5–5.3)
Sodium: 139 mEq/L (ref 135–145)
Total Protein: 6.7 g/dL (ref 6.0–8.3)

## 2011-12-08 LAB — CBC WITH DIFFERENTIAL/PLATELET
EOS%: 5 % (ref 0.0–7.0)
MCH: 25.4 pg (ref 25.1–34.0)
MCV: 80.8 fL (ref 79.5–101.0)
MONO%: 17 % — ABNORMAL HIGH (ref 0.0–14.0)
NEUT#: 2.3 10*3/uL (ref 1.5–6.5)
RBC: 4.48 10*6/uL (ref 3.70–5.45)
RDW: 16 % — ABNORMAL HIGH (ref 11.2–14.5)
nRBC: 0 % (ref 0–0)

## 2011-12-08 MED ORDER — SODIUM CHLORIDE 0.9 % IJ SOLN
10.0000 mL | INTRAMUSCULAR | Status: DC | PRN
  Administered 2011-12-08: 10 mL
  Filled 2011-12-08: qty 10

## 2011-12-08 MED ORDER — SODIUM CHLORIDE 0.9 % IV SOLN
375.0000 mg/m2 | Freq: Once | INTRAVENOUS | Status: AC
  Administered 2011-12-08: 600 mg via INTRAVENOUS
  Filled 2011-12-08: qty 60

## 2011-12-08 MED ORDER — DIPHENHYDRAMINE HCL 25 MG PO CAPS
50.0000 mg | ORAL_CAPSULE | Freq: Once | ORAL | Status: AC
  Administered 2011-12-08: 50 mg via ORAL

## 2011-12-08 MED ORDER — HEPARIN SOD (PORK) LOCK FLUSH 100 UNIT/ML IV SOLN
500.0000 [IU] | Freq: Once | INTRAVENOUS | Status: AC | PRN
  Administered 2011-12-08: 500 [IU]
  Filled 2011-12-08: qty 5

## 2011-12-08 MED ORDER — ACETAMINOPHEN 325 MG PO TABS
650.0000 mg | ORAL_TABLET | Freq: Once | ORAL | Status: AC
  Administered 2011-12-08: 650 mg via ORAL

## 2011-12-08 MED ORDER — SODIUM CHLORIDE 0.9 % IV SOLN
Freq: Once | INTRAVENOUS | Status: AC
  Administered 2011-12-08: 100 mL via INTRAVENOUS

## 2011-12-08 NOTE — Progress Notes (Signed)
[facility]  Name: Helen [name]                  DATE: 12/08/2011 MRN: [id number]                      DOB: 1935/01/23  CC: [name], MD          REFERRING PHYSICIAN: [name], MD  DIAGNOSIS: Patient Active Problem List  Diagnoses Date Noted  . Lymphoma, non-Hodgkin's, low grade 09/09/2011  . POSTSURGICAL HYPOTHYROIDISM 11/18/2010  . ALLERGIC RHINITIS 09/16/2010  . OTHER SPECIFIED ANEMIAS 08/16/2009  . DEGENERATIVE JOINT DISEASE, BOTH KNEES, SEVERE 05/24/2009  . ASTEATOTIC ECZEMA 04/09/2009  . BREAST CANCER, HX OF (Left) 04/09/2009     Encounter Diagnoses  Name Primary?  . Lymphoma, non Hodgkin's   . History of breast cancer, diagnosed October 2009.    Low-grade non-Hodgkin lymphoma status post 6 cycles CVP and Rituxan, now receiving maintenance Rituxan.  October 2009, left breast carcinoma originally presenting as a fungating mass. Underwent neoadjuvant chemotherapy followed by mastectomy.  CURRENT THERAPY: 1. Maintenance Rituxan, week  3 of 4, will receive 4 weeks in a row every 6 months for a total of 2 years. Began 11/24/11 2. Letrozole 2.5 mg daily.   INTERIM HISTORY: Pt is here to for week three of cycle 1, maintenance Rituxan, cycle is for 4 weekly treatments, this will be done every 6 months for a total of 4 cycles.  Chief complaint today is of osteoarthritis and diffuse arthralgias related to anti-estrogen therapy. Also has a diagnosis of degenerative joint disease, bilateral knees. Takes Celebrex when necessary. Occasional hot flashes, no sweats. Appetite is good. Bowel and bladder function are unchanged, alternating constipation and diarrhea. Uses Activia. No nausea or vomiting.   "Pulled a muscle" getting into the transport van and complains of muscle soreness, left arm.   Mammogram scheduled for February 25.   PHYSICAL EXAM: BP 149/76  Pulse 64  Temp(Src) 97.9 F (36.6 C) (Oral)  Ht 5\' 2"  (1.575 m)  Wt 139 lb 6.4 oz (63.231 kg)  BMI  25.50 kg/m2 General: Well developed, well nourished, in no acute distress.  EENT: No ocular or oral lesions. No stomatitis.  Respiratory: Lungs are clear to auscultation bilaterally with normal respiratory movement and no accessory muscle use. Cardiac: No murmur, rub or tachycardia. No upper or lower extremity edema.  GI: Abdomen is soft, no palpable hepatosplenomegaly. No fluid wave. No tenderness. Musculoskeletal: No kyphosis, no tenderness over the spine, ribs or hips. Lymph: No cervical, infraclavicular, axillary or inguinal adenopathy. Neuro: No focal neurological deficits. Psych: Alert, appropriate mood and affect. Appears somewhat forgetful, repeating questions. Questions show that she is unclear about the details of her disease and treatment plan.  LABORATORY STUDIES:   Results for orders placed in visit on 12/08/11  CBC WITH DIFFERENTIAL      Component Value Range   WBC 3.8 (*) 3.9 - 10.3 (10e3/uL)   NEUT# 2.3  1.5 - 6.5 (10e3/uL)   HGB 11.4 (*) 11.6 - 15.9 (g/dL)   HCT 40.9  81.1 - 91.4 (%)   Platelets 199  145 - 400 (10e3/uL)   MCV 80.8  79.5 - 101.0 (fL)   MCH 25.4  25.1 - 34.0 (pg)   MCHC 31.5  31.5 - 36.0 (g/dL)   RBC 7.82  9.56 - 2.13 (10e6/uL)   RDW 16.0 (*) 11.2 - 14.5 (%)   lymph# 0.6 (*) 0.9 - 3.3 (10e3/uL)  MONO# 0.6  0.1 - 0.9 (10e3/uL)   Eosinophils Absolute 0.2  0.0 - 0.5 (10e3/uL)   Basophils Absolute 0.0  0.0 - 0.1 (10e3/uL)   NEUT% 61.3  38.4 - 76.8 (%)   LYMPH% 16.4  14.0 - 49.7 (%)   MONO% 17.0 (*) 0.0 - 14.0 (%)   EOS% 5.0  0.0 - 7.0 (%)   BASO% 0.3  0.0 - 2.0 (%)   nRBC 0  0 - 0 (%)    IMPRESSION:  77 year old [place] American female with: 1. History of left breast cancer, diagnosed 2009, on letrozole 2.5 mg daily with good tolerance. 2. Non-Hodgkin lymphoma, now on maintenance Rituxan, week three of 4 week cycle today. 3. Prolonged interval since last mammogram, she does not recall mammogram since diagnosis in 2009. I can find no report in the  medical record ofa mammogram since that time.  PLAN:   1. Mammogram Feb. 25.  2. Return to clinic next week for week 4 of this cycle.

## 2011-12-15 ENCOUNTER — Ambulatory Visit (HOSPITAL_BASED_OUTPATIENT_CLINIC_OR_DEPARTMENT_OTHER): Payer: Medicare Other

## 2011-12-15 ENCOUNTER — Encounter: Payer: Self-pay | Admitting: Oncology

## 2011-12-15 ENCOUNTER — Ambulatory Visit: Payer: Medicare Other | Admitting: Family

## 2011-12-15 ENCOUNTER — Ambulatory Visit (HOSPITAL_BASED_OUTPATIENT_CLINIC_OR_DEPARTMENT_OTHER): Payer: Medicare Other | Admitting: Oncology

## 2011-12-15 ENCOUNTER — Other Ambulatory Visit (HOSPITAL_BASED_OUTPATIENT_CLINIC_OR_DEPARTMENT_OTHER): Payer: Medicare Other | Admitting: Lab

## 2011-12-15 VITALS — BP 135/70 | HR 60 | Temp 98.1°F

## 2011-12-15 VITALS — BP 161/79 | HR 64 | Temp 98.2°F | Ht 62.0 in | Wt 136.7 lb

## 2011-12-15 DIAGNOSIS — Z5112 Encounter for antineoplastic immunotherapy: Secondary | ICD-10-CM

## 2011-12-15 DIAGNOSIS — C8589 Other specified types of non-Hodgkin lymphoma, extranodal and solid organ sites: Secondary | ICD-10-CM

## 2011-12-15 DIAGNOSIS — C50919 Malignant neoplasm of unspecified site of unspecified female breast: Secondary | ICD-10-CM

## 2011-12-15 DIAGNOSIS — C8590 Non-Hodgkin lymphoma, unspecified, unspecified site: Secondary | ICD-10-CM

## 2011-12-15 LAB — CBC WITH DIFFERENTIAL/PLATELET
Eosinophils Absolute: 0.1 10*3/uL (ref 0.0–0.5)
HCT: 34.6 % — ABNORMAL LOW (ref 34.8–46.6)
LYMPH%: 22.8 % (ref 14.0–49.7)
MCHC: 32.7 g/dL (ref 31.5–36.0)
MCV: 79.7 fL (ref 79.5–101.0)
MONO#: 0.7 10*3/uL (ref 0.1–0.9)
MONO%: 29.2 % — ABNORMAL HIGH (ref 0.0–14.0)
NEUT%: 41.6 % (ref 38.4–76.8)
Platelets: 239 10*3/uL (ref 145–400)
RBC: 4.34 10*6/uL (ref 3.70–5.45)
nRBC: 0 % (ref 0–0)

## 2011-12-15 LAB — COMPREHENSIVE METABOLIC PANEL
ALT: 12 U/L (ref 0–35)
Alkaline Phosphatase: 66 U/L (ref 39–117)
Creatinine, Ser: 0.63 mg/dL (ref 0.50–1.10)
Sodium: 140 mEq/L (ref 135–145)
Total Bilirubin: 0.3 mg/dL (ref 0.3–1.2)
Total Protein: 6.8 g/dL (ref 6.0–8.3)

## 2011-12-15 MED ORDER — ACETAMINOPHEN 325 MG PO TABS
650.0000 mg | ORAL_TABLET | Freq: Once | ORAL | Status: AC
  Administered 2011-12-15: 650 mg via ORAL

## 2011-12-15 MED ORDER — SODIUM CHLORIDE 0.9 % IJ SOLN
10.0000 mL | INTRAMUSCULAR | Status: DC | PRN
  Administered 2011-12-15: 10 mL
  Filled 2011-12-15: qty 10

## 2011-12-15 MED ORDER — DIPHENHYDRAMINE HCL 25 MG PO CAPS
50.0000 mg | ORAL_CAPSULE | Freq: Once | ORAL | Status: AC
  Administered 2011-12-15: 50 mg via ORAL

## 2011-12-15 MED ORDER — SODIUM CHLORIDE 0.9 % IV SOLN
375.0000 mg/m2 | Freq: Once | INTRAVENOUS | Status: AC
  Administered 2011-12-15: 600 mg via INTRAVENOUS
  Filled 2011-12-15: qty 60

## 2011-12-15 MED ORDER — SODIUM CHLORIDE 0.9 % IV SOLN
Freq: Once | INTRAVENOUS | Status: AC
  Administered 2011-12-15: 13:00:00 via INTRAVENOUS

## 2011-12-15 MED ORDER — HEPARIN SOD (PORK) LOCK FLUSH 100 UNIT/ML IV SOLN
500.0000 [IU] | Freq: Once | INTRAVENOUS | Status: AC | PRN
  Administered 2011-12-15: 500 [IU]
  Filled 2011-12-15: qty 5

## 2011-12-15 NOTE — Progress Notes (Signed)
OFFICE PROGRESS NOTE  CC  Helen Linger, MD, MD [address] [facility] [address] [place], [address] [place] [address]  DIAGNOSIS: Your-year-old female with:  #1 take left breast carcinoma originally presenting as a fungating mass. Patient underwent neoadjuvant chemotherapy followed by mastectomy. She is currently on letrozole 2.5 mg daily.  #2 low-grade non-Hodgkin lymphoma status post CVP and Rituxan now receiving maintenance Rituxan.  PRIOR THERAPY:  #1 the patient was diagnosed with metastatic breast carcinoma beginning October 2009. She returned her 1 chemotherapy followed by mastectomy radiation. She has been on letrozole 2.5 mg daily tolerating it well.  #2 Was diagnosed with low-grade non-Hodgkin lymphoma  12. She received 6 cycles of CVP and Rituxan. Last week for maintenance Rituxan. She is to receive 4 weeks in a row every 6 months for a total of 2 years.  CURRENT THERAPY:Is here for maintenance Rituxan  Week #4/4  INTERVAL HISTORY: Helen [name] 77 y.o. female returns for Visit today. Overall she is doing well. Patient has become quite forgetful. On her last visit with Colman Cater patient was unclear why she was seeing [place]. She was explained the role of maintenance Rituxan by Colman Cater. But in spite of that patient still remains quite confused and this is quite concerning. She otherwise denies any fevers chills night sweats headaches shortness of breath chest pains palpitations she has no myalgias or arthralgias. He material hematochezia melena hemoptysis or hematemesis. She denies having any peripheral paresthesias no weight problems no gait disturbances. Remainder of the 10 point review of systems is negative.  MEDICAL HISTORY: Past Medical History  Diagnosis Date  . Breast CA     (LT) breast ca dx 10/09  . Lymphoma 09/09/2011  . Cancer     ALLERGIES:  is allergic to codeine; penicillins; sulfonamide derivatives; and tramadol hcl.  MEDICATIONS:  Current Outpatient  Prescriptions  Medication Sig Dispense Refill  . celecoxib (CELEBREX) 200 MG capsule Take 200 mg by mouth daily as needed.       [name] letrozole (FEMARA) 2.5 MG tablet Take 1 tablet (2.5 mg total) by mouth daily.  30 tablet  12  . cetirizine (ZYRTEC) 10 MG tablet Take 10 mg by mouth daily.        [name] triamcinolone (KENALOG) 0.1 % cream Apply topically 2 (two) times daily.         No current facility-administered medications for this visit.   Facility-Administered Medications Ordered in Other Visits  Medication Dose Route Frequency Provider Last Rate Last Dose  . 0.9 %  sodium chloride infusion   Intravenous Once Victorino December, MD      . acetaminophen (TYLENOL) tablet 650 mg  650 mg Oral Once Victorino December, MD   650 mg at 12/15/11 1305  . diphenhydrAMINE (BENADRYL) capsule 50 mg  50 mg Oral Once Victorino December, MD   50 mg at 12/15/11 1305  . heparin lock flush 100 unit/mL  500 Units Intracatheter Once PRN Victorino December, MD   500 Units at 12/15/11 1557  . riTUXimab (RITUXAN) 600 mg in sodium chloride 0.9 % 250 mL chemo infusion  375 mg/m2 (Treatment Plan Actual) Intravenous Once Victorino December, MD   600 mg at 12/15/11 1500  . sodium chloride 0.9 % injection 10 mL  10 mL Intracatheter PRN Victorino December, MD   10 mL at 12/15/11 1557    SURGICAL HISTORY:  Past Surgical History  Procedure Date  . Mastectomy   . Thyroidectomy  REVIEW OF SYSTEMS:  Pertinent items are noted in HPI.   PHYSICAL EXAMINATION: General appearance: alert, cooperative and appears stated age Neck: no adenopathy, no carotid bruit, no JVD, supple, symmetrical, trachea midline and thyroid not enlarged, symmetric, no tenderness/mass/nodules Lymph nodes: Cervical, supraclavicular, and axillary nodes normal. Resp: clear to auscultation bilaterally and normal percussion bilaterally Back: symmetric, no curvature. ROM normal. No CVA tenderness. Cardio: regular rate and rhythm, S1, S2 normal, no murmur, click, rub or  gallop and normal apical impulse GI: soft, non-tender; bowel sounds normal; no masses,  no organomegaly Extremities: extremities normal, atraumatic, no cyanosis or edema Neurologic: Alert and oriented X 3, normal strength and tone. Normal symmetric reflexes. Normal coordination and gait  ECOG PERFORMANCE STATUS: 1 - Symptomatic but completely ambulatory  Blood pressure 161/79, pulse 64, temperature 98.2 F (36.8 C), temperature source Oral, height 5\' 2"  (1.575 m), weight 136 lb 11.2 oz (62.007 kg).  LABORATORY DATA: Lab Results  Component Value Date   WBC 2.5* 12/15/2011   HGB 11.3* 12/15/2011   HCT 34.6* 12/15/2011   MCV 79.7 12/15/2011   PLT 239 12/15/2011      Chemistry      Component Value Date/Time   NA 139 12/08/2011 0958   NA 136 08/14/2010 1251   K 3.9 12/08/2011 0958   K 3.9 08/14/2010 1251   CL 107 12/08/2011 0958   CL 102 08/14/2010 1251   CO2 24 12/08/2011 0958   CO2 28 08/14/2010 1251   BUN 10 12/08/2011 0958   BUN 12 08/14/2010 1251   CREATININE 0.72 12/08/2011 0958   CREATININE 0.6 08/14/2010 1251      Component Value Date/Time   CALCIUM 9.5 12/08/2011 0958   CALCIUM 9.7 08/14/2010 1251   ALKPHOS 73 12/08/2011 0958   ALKPHOS 78 08/14/2010 1251   AST 21 12/08/2011 0958   AST 19 08/14/2010 1251   ALT 12 12/08/2011 0958   BILITOT 0.3 12/08/2011 0958   BILITOT 0.40 08/14/2010 1251       RADIOGRAPHIC STUDIES:  No results found.  ASSESSMENT: 77 year old female with  #1 low-grade non-Hodgkin lymphoma patient will proceed with her maintenance Rituxan. Today is her final treatment for this regimen.  #2 metastatic breast carcinoma originally diagnosed in 2009 now maintained on letrozole 2.5 mg daily.   PLAN: 1 patient will proceed with her scheduled Rituxan.  #2 we will go ahead and set her up for a mammogram of the right breast.  #3 patient will be seen back in 6 months with labs, scans, and rituxan weeks 1 - 4, orders for all placed including antibody plan (  rituxan)   All questions were answered. The patient knows to call the clinic with any problems, questions or concerns. We can certainly see the patient much sooner if necessary.  I spent 20 minutes counseling the patient face to face. The total time spent in the appointment was 30 minutes.    Drue Second, MD Medical/Oncology [facility] [phone] (beeper) [phone] (Office)  12/15/2011, 4:33 PM

## 2011-12-15 NOTE — Progress Notes (Signed)
1325: Rituxan started at 100mg /hr, 65mls/hr X . 1400: Pt tolerating well. Rate increased to 200mg /hr , 190mls/hr X . 1430: VSS, rate increased to 300mg /hr, 129mls/hr X . 1500: Pt tolerating well. Rate increased to 400mg /hr, 27mls/hr X .

## 2011-12-22 ENCOUNTER — Ambulatory Visit: Payer: Medicare Other | Admitting: Family

## 2011-12-22 ENCOUNTER — Other Ambulatory Visit (HOSPITAL_BASED_OUTPATIENT_CLINIC_OR_DEPARTMENT_OTHER): Payer: Medicare Other | Admitting: Lab

## 2011-12-22 ENCOUNTER — Ambulatory Visit: Payer: Medicare Other

## 2011-12-22 DIAGNOSIS — C8590 Non-Hodgkin lymphoma, unspecified, unspecified site: Secondary | ICD-10-CM

## 2011-12-22 DIAGNOSIS — C8589 Other specified types of non-Hodgkin lymphoma, extranodal and solid organ sites: Secondary | ICD-10-CM

## 2011-12-22 LAB — COMPREHENSIVE METABOLIC PANEL
ALT: 14 U/L (ref 0–35)
Albumin: 4.2 g/dL (ref 3.5–5.2)
Alkaline Phosphatase: 72 U/L (ref 39–117)
Potassium: 4.3 mEq/L (ref 3.5–5.3)
Sodium: 139 mEq/L (ref 135–145)
Total Bilirubin: 0.3 mg/dL (ref 0.3–1.2)
Total Protein: 6.9 g/dL (ref 6.0–8.3)

## 2011-12-22 LAB — CBC WITH DIFFERENTIAL/PLATELET
BASO%: 1.6 % (ref 0.0–2.0)
LYMPH%: 14.5 % (ref 14.0–49.7)
MCHC: 31.9 g/dL (ref 31.5–36.0)
MCV: 80.6 fL (ref 79.5–101.0)
MONO#: 1 10*3/uL — ABNORMAL HIGH (ref 0.1–0.9)
MONO%: 37.5 % — ABNORMAL HIGH (ref 0.0–14.0)
NEUT#: 1 10*3/uL — ABNORMAL LOW (ref 1.5–6.5)
Platelets: 252 10*3/uL (ref 145–400)
RBC: 4.75 10*6/uL (ref 3.70–5.45)
RDW: 15.3 % — ABNORMAL HIGH (ref 11.2–14.5)
WBC: 2.6 10*3/uL — ABNORMAL LOW (ref 3.9–10.3)

## 2011-12-29 ENCOUNTER — Ambulatory Visit
Admission: RE | Admit: 2011-12-29 | Discharge: 2011-12-29 | Disposition: A | Discharge status: Home / Self Care | Payer: Medicare Other | Source: Ambulatory Visit | Attending: Family | Admitting: Family

## 2011-12-29 ENCOUNTER — Other Ambulatory Visit: Payer: Self-pay | Admitting: Family

## 2011-12-29 DIAGNOSIS — Z853 Personal history of malignant neoplasm of breast: Secondary | ICD-10-CM

## 2012-01-29 ENCOUNTER — Telehealth: Payer: Self-pay | Admitting: Oncology

## 2012-01-29 NOTE — Telephone Encounter (Signed)
S/w the pt and she is aware to come in at 10:30am on 06/10/2012 due to achange in the md's schedule

## 2012-02-10 ENCOUNTER — Other Ambulatory Visit (INDEPENDENT_AMBULATORY_CARE_PROVIDER_SITE_OTHER): Payer: Medicare Other

## 2012-02-10 ENCOUNTER — Encounter: Payer: Self-pay | Admitting: Internal Medicine

## 2012-02-10 ENCOUNTER — Ambulatory Visit (INDEPENDENT_AMBULATORY_CARE_PROVIDER_SITE_OTHER): Payer: Medicare Other | Admitting: Internal Medicine

## 2012-02-10 VITALS — BP 130/72 | HR 69 | Temp 98.1°F | Resp 16 | Ht 62.0 in | Wt 142.0 lb

## 2012-02-10 DIAGNOSIS — M949 Disorder of cartilage, unspecified: Secondary | ICD-10-CM

## 2012-02-10 DIAGNOSIS — E7800 Pure hypercholesterolemia, unspecified: Secondary | ICD-10-CM

## 2012-02-10 DIAGNOSIS — M8580 Other specified disorders of bone density and structure, unspecified site: Secondary | ICD-10-CM

## 2012-02-10 DIAGNOSIS — Z23 Encounter for immunization: Secondary | ICD-10-CM

## 2012-02-10 DIAGNOSIS — Z0000 Encounter for general adult medical examination without abnormal findings: Secondary | ICD-10-CM | POA: Insufficient documentation

## 2012-02-10 DIAGNOSIS — E890 Postprocedural hypothyroidism: Secondary | ICD-10-CM

## 2012-02-10 DIAGNOSIS — R7309 Other abnormal glucose: Secondary | ICD-10-CM

## 2012-02-10 DIAGNOSIS — M1710 Unilateral primary osteoarthritis, unspecified knee: Secondary | ICD-10-CM

## 2012-02-10 DIAGNOSIS — D6489 Other specified anemias: Secondary | ICD-10-CM

## 2012-02-10 DIAGNOSIS — M899 Disorder of bone, unspecified: Secondary | ICD-10-CM

## 2012-02-10 LAB — CBC WITH DIFFERENTIAL/PLATELET
Basophils Absolute: 0 10*3/uL (ref 0.0–0.1)
Basophils Relative: 0.1 % (ref 0.0–3.0)
Eosinophils Absolute: 0.3 10*3/uL (ref 0.0–0.7)
Hemoglobin: 12.2 g/dL (ref 12.0–15.0)
Lymphocytes Relative: 11.8 % — ABNORMAL LOW (ref 12.0–46.0)
MCHC: 32.8 g/dL (ref 30.0–36.0)
MCV: 82.6 fl (ref 78.0–100.0)
Monocytes Absolute: 0.7 10*3/uL (ref 0.1–1.0)
Neutro Abs: 3.1 10*3/uL (ref 1.4–7.7)
Neutrophils Relative %: 67.3 % (ref 43.0–77.0)
RDW: 16.1 % — ABNORMAL HIGH (ref 11.5–14.6)

## 2012-02-10 LAB — LIPID PANEL
Cholesterol: 225 mg/dL — ABNORMAL HIGH (ref 0–200)
HDL: 60.6 mg/dL (ref >39.00)
Total CHOL/HDL Ratio: 4
Triglycerides: 99 mg/dL (ref 0.0–149.0)
VLDL: 19.8 mg/dL (ref 0.0–40.0)

## 2012-02-10 LAB — COMPREHENSIVE METABOLIC PANEL
ALT: 19 U/L (ref 0–35)
AST: 20 U/L (ref 0–37)
Alkaline Phosphatase: 69 U/L (ref 39–117)
BUN: 11 mg/dL (ref 6–23)
Calcium: 9.8 mg/dL (ref 8.4–10.5)
Chloride: 107 mEq/L (ref 96–112)
Creatinine, Ser: 0.7 mg/dL (ref 0.4–1.2)

## 2012-02-10 LAB — LDL CHOLESTEROL, DIRECT: Direct LDL: 143.6 mg/dL

## 2012-02-10 MED ORDER — FLAVOCOXID-CIT ZN BISGLCINATE 500-50 MG PO CAPS: 1.0000 | ORAL_CAPSULE | Freq: Two times a day (BID) | ORAL | Status: DC

## 2012-02-10 NOTE — Progress Notes (Signed)
Subjective:    Patient ID: Helen [name], female    DOB: Apr 04, 1935, 77 y.o.   MRN: [id number]  Arthritis Presents for follow-up visit. She complains of pain and stiffness. She reports no joint swelling or joint warmth. The symptoms have been worsening. Affected locations include the left knee and right knee. Her pain is at a severity of 3/10. Pertinent negatives include no diarrhea, dry eyes, dry mouth, dysuria, fatigue, fever, pain at night, pain while resting, rash, Raynaud's syndrome, uveitis or weight loss. Compliance with total regimen is 76-100%.      Review of Systems  Constitutional: Negative for fever, chills, weight loss, diaphoresis, activity change, appetite change, fatigue and unexpected weight change.  HENT: Negative.   Eyes: Negative.   Respiratory: Negative for cough, chest tightness, shortness of breath, wheezing and stridor.   Cardiovascular: Negative for chest pain, palpitations and leg swelling.  Gastrointestinal: Negative for nausea, vomiting, abdominal pain, diarrhea, constipation, blood in stool, abdominal distention, anal bleeding and rectal pain.  Genitourinary: Negative.  Negative for dysuria.  Musculoskeletal: Positive for arthralgias (both knees), arthritis and stiffness. Negative for back pain, joint swelling and gait problem.  Skin: Negative for color change, pallor, rash and wound.  Neurological: Negative for dizziness, tremors, seizures, syncope, facial asymmetry, speech difficulty, weakness, light-headedness, numbness and headaches.  Hematological: Negative for adenopathy. Does not bruise/bleed easily.  Psychiatric/Behavioral: Negative.        Objective:   Physical Exam  Vitals reviewed. Constitutional: She is oriented to person, place, and time. She appears well-developed and well-nourished. No distress.  HENT:  Head: Normocephalic and atraumatic.  Mouth/Throat: Oropharynx is clear and moist. No oropharyngeal exudate.  Eyes: Conjunctivae are  normal. Right eye exhibits no discharge. Left eye exhibits no discharge. No scleral icterus.  Neck: Normal range of motion. Neck supple. No JVD present. No tracheal deviation present. No thyromegaly present.  Cardiovascular: Normal rate, regular rhythm, normal heart sounds and intact distal pulses.  Exam reveals no gallop and no friction rub.   No murmur heard. Pulmonary/Chest: Effort normal and breath sounds normal. No stridor. No respiratory distress. She has no wheezes. She has no rales. She exhibits no tenderness.  Abdominal: Soft. Bowel sounds are normal. She exhibits no distension and no mass. There is no tenderness. There is no rebound and no guarding.  Musculoskeletal: Normal range of motion. She exhibits no edema and no tenderness.       Right knee: She exhibits deformity (DJD). She exhibits normal range of motion, no swelling, no effusion, no ecchymosis, no laceration, no erythema, normal alignment, no LCL laxity, normal patellar mobility and no bony tenderness. no tenderness found.       Left knee: She exhibits deformity (DJD). She exhibits normal range of motion, no swelling, no effusion, no ecchymosis, no laceration, no erythema, normal alignment, no LCL laxity, normal patellar mobility and no bony tenderness. no tenderness found.  Lymphadenopathy:    She has no cervical adenopathy.  Neurological: She is oriented to person, place, and time.  Skin: Skin is warm and dry. No rash noted. She is not diaphoretic. No erythema. No pallor.  Psychiatric: She has a normal mood and affect. Her behavior is normal. Judgment and thought content normal.     Lab Results  Component Value Date   WBC 2.6* 12/22/2011   HGB 12.2 12/22/2011   HCT 38.3 12/22/2011   PLT 252 12/22/2011   GLUCOSE 102* 12/22/2011   ALT 14 12/22/2011   AST 24 12/22/2011   NA  139 12/22/2011   K 4.3 12/22/2011   CL 105 12/22/2011   CREATININE 0.78 12/22/2011   BUN 10 12/22/2011   CO2 23 12/22/2011   TSH 2.96 01/17/2011   INR 0.97  03/11/2011       Assessment & Plan:

## 2012-02-10 NOTE — Assessment & Plan Note (Signed)
Will check the TSH today 

## 2012-02-10 NOTE — Assessment & Plan Note (Signed)
I will check her FLP today 

## 2012-02-10 NOTE — Assessment & Plan Note (Signed)
Exam done, vaccines were updated, labs ordered, she was referred for colonoscopy, pt ed material was given  The patient is here for annual Medicare wellness examination and management of other chronic and acute problems.   The risk factors are reflected in the social history.  The roster of all physicians providing medical care to patient - is listed in the Snapshot section of the chart.  Activities of daily living:  The patient is 100% inedpendent in all ADLs: dressing, toileting, feeding as well as independent mobility  Home safety : The patient has smoke detectors in the home. They wear seatbelts.No firearms at home ( firearms are present in the home, kept in a safe fashion). There is no violence in the home.   There is no risks for hepatitis, STDs or HIV. There is no   history of blood transfusion. They have no travel history to infectious disease endemic areas of the world.  The patient has (has not) seen their dentist in the last six month. They have (not) seen their eye doctor in the last year. They deny (admit to) any hearing difficulty and have not had audiologic testing in the last year.  They do not  have excessive sun exposure. Discussed the need for sun protection: hats, long sleeves and use of sunscreen if there is significant sun exposure.   Diet: the importance of a healthy diet is discussed. They do have a healthy (unhealthy-high fat/fast food) diet.  The patient has a regular exercise program: _______ , ____duration, _____per week.  The benefits of regular aerobic exercise were discussed.  Depression screen: there are no signs or vegative symptoms of depression- irritability, change in appetite, anhedonia, sadness/tearfullness.  Cognitive assessment: the patient manages all their financial and personal affairs and is actively engaged. They could relate day,date,year and events; recalled 3/3 objects at 3 minutes; performed clock-face test normally.  The following portions of  the patient's history were reviewed and updated as appropriate: allergies, current medications, past family history, past medical history,  past surgical history, past social history  and problem list.  Vision, hearing, body mass index were assessed and reviewed.   During the course of the visit the patient was educated and counseled about appropriate screening and preventive services including : fall prevention , diabetes screening, nutrition counseling, colorectal cancer screening, and recommended immunizations.

## 2012-02-10 NOTE — Assessment & Plan Note (Signed)
I will check her a1c today to screen for DM II

## 2012-02-10 NOTE — Assessment & Plan Note (Signed)
She is having persistent discomfort so I have asked her to start limbrel in addition to the celeberx

## 2012-02-10 NOTE — Patient Instructions (Signed)
Hypothyroidism The thyroid is a large gland located in the lower front of your neck. The thyroid gland helps control metabolism. Metabolism is how your body handles food. It controls metabolism with the hormone thyroxine. When this gland is underactive (hypothyroid), it produces too little hormone.  CAUSES These include:   Absence or destruction of thyroid tissue.   Goiter due to iodine deficiency.   Goiter due to medications.   Congenital defects (since birth).   Problems with the pituitary. This causes a lack of TSH (thyroid stimulating hormone). This hormone tells the thyroid to turn out more hormone.  SYMPTOMS  Lethargy (feeling as though you have no energy)   Cold intolerance   Weight gain (in spite of normal food intake)   Dry skin   Coarse hair   Menstrual irregularity (if severe, may lead to infertility)   Slowing of thought processes  Cardiac problems are also caused by insufficient amounts of thyroid hormone. Hypothyroidism in the newborn is cretinism, and is an extreme form. It is important that this form be treated adequately and immediately or it will lead rapidly to retarded physical and mental development. DIAGNOSIS  To prove hypothyroidism, your caregiver may do blood tests and ultrasound tests. Sometimes the signs are hidden. It may be necessary for your caregiver to watch this illness with blood tests either before or after diagnosis and treatment. TREATMENT  Low levels of thyroid hormone are increased by using synthetic thyroid hormone. This is a safe, effective treatment. It usually takes about four weeks to gain the full effects of the medication. After you have the full effect of the medication, it will generally take another four weeks for problems to leave. Your caregiver may start you on low doses. If you have had heart problems the dose may be gradually increased. It is generally not an emergency to get rapidly to normal. HOME CARE INSTRUCTIONS   Take  your medications as your caregiver suggests. Let your caregiver know of any medications you are taking or start taking. Your caregiver will help you with dosage schedules.   As your condition improves, your dosage needs may increase. It will be necessary to have continuing blood tests as suggested by your caregiver.   Report all suspected medication side effects to your caregiver.  SEEK MEDICAL CARE IF: Seek medical care if you develop:  Sweating.   Tremulousness (tremors).   Anxiety.   Rapid weight loss.   Heat intolerance.   Emotional swings.   Diarrhea.   Weakness.  SEEK IMMEDIATE MEDICAL CARE IF:  You develop chest pain, an irregular heart beat (palpitations), or a rapid heart beat. MAKE SURE YOU:   Understand these instructions.   Will watch your condition.   Will get help right away if you are not doing well or get worse.  Document Released: 10/20/2005 Document Revised: 10/09/2011 Document Reviewed: 06/09/2008 [facility] Patient Information 2012 [place], [place].Degenerative Arthritis You have osteoarthritis. This is the wear and tear arthritis that comes with aging. It is also called degenerative arthritis. This is common in people past middle age. It is caused by stress on the joints. The large weight bearing joints of the lower extremities are most often affected. The knees, hips, back, neck, and hands can become painful, swollen, and stiff. This is the most common type of arthritis. It comes on with age, carrying too much weight, or from an injury. Treatment includes resting the sore joint until the pain and swelling improve. Crutches or a walker may be needed  for severe flares. Only take over-the-counter or prescription medicines for pain, discomfort, or fever as directed by your caregiver. Local heat therapy may improve motion. Cortisone shots into the joint are sometimes used to reduce pain and swelling during flares. Osteoarthritis is usually not crippling and  progresses slowly. There are things you can do to decrease pain:  Avoid high impact activities.   Exercise regularly.   Low impact exercises such as walking, biking and swimming help to keep the muscles strong and keep normal joint function.   Stretching helps to keep your range of motion.   Lose weight if you are overweight. This reduces joint stress.  In severe cases when you have pain at rest or increasing disability, joint surgery may be helpful. See your caregiver for follow-up treatment as recommended.  SEEK IMMEDIATE MEDICAL CARE IF:   You have severe joint pain.   Marked swelling and redness in your joint develops.   You develop a high fever.  Document Released: 10/20/2005 Document Revised: 10/09/2011 Document Reviewed: 03/22/2007 [facility] Patient Information 2012 [place], [place].

## 2012-02-10 NOTE — Assessment & Plan Note (Signed)
Recheck her CBC today 

## 2012-02-10 NOTE — Assessment & Plan Note (Signed)
She is due for a DEXA scan 

## 2012-02-13 ENCOUNTER — Encounter: Payer: Self-pay | Admitting: Internal Medicine

## 2012-02-13 MED ORDER — COLESEVELAM HCL 3.75 G PO PACK: 1.0000 | PACK | Freq: Every day | ORAL | Status: DC

## 2012-02-13 NOTE — Progress Notes (Signed)
Addended by: [name] on: 02/13/2012 07:39 AM   Modules accepted: Orders

## 2012-02-18 ENCOUNTER — Ambulatory Visit (INDEPENDENT_AMBULATORY_CARE_PROVIDER_SITE_OTHER)
Admission: RE | Admit: 2012-02-18 | Discharge: 2012-02-18 | Disposition: A | Discharge status: Home / Self Care | Payer: Medicare Other | Source: Ambulatory Visit | Attending: Internal Medicine | Admitting: Internal Medicine

## 2012-02-18 DIAGNOSIS — R7309 Other abnormal glucose: Secondary | ICD-10-CM

## 2012-02-18 DIAGNOSIS — M8580 Other specified disorders of bone density and structure, unspecified site: Secondary | ICD-10-CM

## 2012-02-18 DIAGNOSIS — E7800 Pure hypercholesterolemia, unspecified: Secondary | ICD-10-CM

## 2012-02-18 DIAGNOSIS — M949 Disorder of cartilage, unspecified: Secondary | ICD-10-CM

## 2012-02-18 DIAGNOSIS — M899 Disorder of bone, unspecified: Secondary | ICD-10-CM

## 2012-02-19 ENCOUNTER — Encounter: Payer: Self-pay | Admitting: Gastroenterology

## 2012-02-23 LAB — HM DEXA SCAN

## 2012-02-25 ENCOUNTER — Telehealth: Payer: Self-pay

## 2012-02-25 NOTE — Telephone Encounter (Signed)
Patient called lmovm requesting a call back regarding lab letter and cholesterol. I returned call back to back//LMOVM to call back

## 2012-02-26 NOTE — Telephone Encounter (Signed)
There is not a generic option so will have to cancel this Rx

## 2012-02-26 NOTE — Telephone Encounter (Signed)
Patient notifeid.

## 2012-02-26 NOTE — Telephone Encounter (Signed)
Spoke with patient who advise welchol is too expensive $170.68, copay card could not be used due to [employer].  Please advise per pt Thanks

## 2012-02-26 NOTE — Telephone Encounter (Signed)
Called x 2//LMOVM to call back

## 2012-03-02 ENCOUNTER — Ambulatory Visit (AMBULATORY_SURGERY_CENTER): Payer: Medicare Other | Admitting: *Deleted

## 2012-03-02 VITALS — Ht 62.0 in | Wt 140.0 lb

## 2012-03-02 DIAGNOSIS — Z1211 Encounter for screening for malignant neoplasm of colon: Secondary | ICD-10-CM

## 2012-03-02 MED ORDER — PEG-KCL-NACL-NASULF-NA ASC-C 100 G PO SOLR: ORAL | Status: DC

## 2012-03-16 ENCOUNTER — Ambulatory Visit (AMBULATORY_SURGERY_CENTER): Payer: Medicare Other | Admitting: Gastroenterology

## 2012-03-16 ENCOUNTER — Encounter: Payer: Self-pay | Admitting: Gastroenterology

## 2012-03-16 VITALS — BP 179/95 | HR 73 | Temp 96.6°F | Resp 27 | Ht 62.0 in | Wt 140.0 lb

## 2012-03-16 DIAGNOSIS — K5730 Diverticulosis of large intestine without perforation or abscess without bleeding: Secondary | ICD-10-CM

## 2012-03-16 DIAGNOSIS — Z1211 Encounter for screening for malignant neoplasm of colon: Secondary | ICD-10-CM

## 2012-03-16 MED ORDER — SODIUM CHLORIDE 0.9 % IV SOLN: 500.0000 mL | INTRAVENOUS | Status: DC

## 2012-03-16 NOTE — Progress Notes (Signed)
Pt has a porta cath 

## 2012-03-16 NOTE — Op Note (Signed)
[facility] [address] [place], [place]  [address]  COLONOSCOPY PROCEDURE REPORT  PATIENT:  Helen [name], Helen [name]  MR#:  [id number] BIRTHDATE:  07-06-1935, 76 yrs. old  GENDER:  female ENDOSCOPIST:  Barbette Hair. Arlyce Dice, MD REF. BY:  [name], M.D. PROCEDURE DATE:  03/16/2012 PROCEDURE:  Diagnostic Colonoscopy ASA CLASS:  Class II INDICATIONS:  Routine Risk Screening MEDICATIONS:   MAC sedation, administered by CRNA propofol 100mg IV  DESCRIPTION OF PROCEDURE:   After the risks benefits and alternatives of the procedure were thoroughly explained, informed consent was obtained.  Digital rectal exam was performed and revealed no abnormalities.   The LB [id number] endoscope was introduced through the anus and advanced to the cecum, which was identified by both the appendix and ileocecal valve, without limitations.  The quality of the prep was excellent, using MoviPrep.  The instrument was then slowly withdrawn as the colon was fully examined. <<PROCEDUREIMAGES>>  FINDINGS:  Moderate diverticulosis was found in the sigmoid colon (see image4 and image1).  This was otherwise a normal examination of the colon (see image2 and image5).   Retroflexed views in the rectum revealed no abnormalities.    The time to cecum =  1) 2.0 minutes. The scope was then withdrawn in  1) 6.0  minutes from the cecum and the procedure completed. COMPLICATIONS:  None ENDOSCOPIC IMPRESSION: 1) Moderate diverticulosis in the sigmoid colon 2) Otherwise normal examination RECOMMENDATIONS: 1) Continue current colorectal screening recommendations for "routine risk" patients with a repeat colonoscopy in 10 years. REPEAT EXAM:  In 10 year(s) for Colonoscopy.  ______________________________ Barbette Hair. Arlyce Dice, MD  CC:  n. eSIGNED:   Barbette Hair. Ezella Kell at 03/16/2012 08:50 AM  Earl Gala, [phone]

## 2012-03-16 NOTE — Progress Notes (Signed)
Patient did not experience any of the following events: a burn prior to discharge; a fall within the facility; wrong site/side/patient/procedure/implant event; or a hospital transfer or hospital admission upon discharge from the facility. (G8907) Patient did not have preoperative order for IV antibiotic SSI prophylaxis. (G8918) Patient did not have preoperative order for IV antibiotic SSI prophylaxis. (G8918)  

## 2012-03-16 NOTE — Progress Notes (Signed)
The pt tolerated the colonoscopy very well. Maw   

## 2012-03-16 NOTE — Patient Instructions (Signed)
Impressions/Recommendations:  Diverticulosis (handout given) High Fiber Diet (handout given)  Repeat colonoscopy in 10 years.  YOU HAD AN ENDOSCOPIC PROCEDURE TODAY AT THE [facility]: Refer to the procedure report that was given to you for any specific questions about what was found during the examination.  If the procedure report does not answer your questions, please call your gastroenterologist to clarify.  If you requested that your care partner not be given the details of your procedure findings, then the procedure report has been included in a sealed envelope for you to review at your convenience later.  YOU SHOULD EXPECT: Some feelings of bloating in the abdomen. Passage of more gas than usual.  Walking can help get rid of the air that was put into your GI tract during the procedure and reduce the bloating. If you had a lower endoscopy (such as a colonoscopy or flexible sigmoidoscopy) you may notice spotting of blood in your stool or on the toilet paper. If you underwent a bowel prep for your procedure, then you may not have a normal bowel movement for a few days.  DIET: Your first meal following the procedure should be a light meal and then it is ok to progress to your normal diet.  A half-sandwich or bowl of soup is an example of a good first meal.  Heavy or fried foods are harder to digest and may make you feel nauseous or bloated.  Likewise meals heavy in dairy and vegetables can cause extra gas to form and this can also increase the bloating.  Drink plenty of fluids but you should avoid alcoholic beverages for 24 hours.  ACTIVITY: Your care partner should take you home directly after the procedure.  You should plan to take it easy, moving slowly for the rest of the day.  You can resume normal activity the day after the procedure however you should NOT DRIVE or use heavy machinery for 24 hours (because of the sedation medicines used during the test).    SYMPTOMS TO REPORT  IMMEDIATELY: A gastroenterologist can be reached at any hour.  During normal business hours, 8:30 AM to 5:00 PM Monday through Friday, call [phone].  After hours and on weekends, please call the GI answering service at [phone] who will take a message and have the physician on call contact you.   Following lower endoscopy (colonoscopy or flexible sigmoidoscopy):  Excessive amounts of blood in the stool  Significant tenderness or worsening of abdominal pains  Swelling of the abdomen that is new, acute  Fever of 100F or higher  Following upper endoscopy (EGD)  Vomiting of blood or coffee ground material  New chest pain or pain under the shoulder blades  Painful or persistently difficult swallowing  New shortness of breath  Fever of 100F or higher  Black, tarry-looking stools  FOLLOW UP: If any biopsies were taken you will be contacted by phone or by letter within the next 1-3 weeks.  Call your gastroenterologist if you have not heard about the biopsies in 3 weeks.  Our staff will call the home number listed on your records the next business day following your procedure to check on you and address any questions or concerns that you may have at that time regarding the information given to you following your procedure. This is a courtesy call and so if there is no answer at the home number and we have not heard from you through the emergency physician on call, we will assume  that you have returned to your regular daily activities without incident.  SIGNATURES/CONFIDENTIALITY: You and/or your care partner have signed paperwork which will be entered into your electronic medical record.  These signatures attest to the fact that that the information above on your After Visit Summary has been reviewed and is understood.  Full responsibility of the confidentiality of this discharge information lies with you and/or your care-partner.

## 2012-03-17 ENCOUNTER — Telehealth: Payer: Self-pay | Admitting: *Deleted

## 2012-03-17 NOTE — Telephone Encounter (Signed)
  Follow up Call-  Call back number 03/16/2012  Post procedure Call Back phone  # [phone]  Permission to leave phone message Yes     Patient questions:  Do you have a fever, pain , or abdominal swelling? no Pain Score  0 *  Have you tolerated food without any problems? yes  Have you been able to return to your normal activities? yes  Do you have any questions about your discharge instructions: Diet   no Medications  no Follow up visit  no  Do you have questions or concerns about your Care? no  Actions: * If pain score is 4 or above: No action needed, pain <4.

## 2012-03-20 ENCOUNTER — Telehealth: Payer: Self-pay | Admitting: Internal Medicine

## 2012-03-20 NOTE — Telephone Encounter (Signed)
First stool since colonoscopy (no biopsies/polypectomy) 5/14 - dark, black and tarry. No red. Had some abdominal cramps.  I explained she could be having GI bleeding and advised she go to urgent care or ED.  She is not lightheaded or presyncopal, feels ok now. Has not been using Celebrex lately or other NSAIDS.

## 2012-04-01 ENCOUNTER — Emergency Department (HOSPITAL_COMMUNITY)
Admission: EM | Admit: 2012-04-01 | Discharge: 2012-04-01 | Disposition: A | Discharge status: Home / Self Care | Payer: Medicare Other | Attending: Emergency Medicine | Admitting: Emergency Medicine

## 2012-04-01 ENCOUNTER — Encounter (HOSPITAL_COMMUNITY): Payer: Self-pay

## 2012-04-01 DIAGNOSIS — Z882 Allergy status to sulfonamides status: Secondary | ICD-10-CM | POA: Insufficient documentation

## 2012-04-01 DIAGNOSIS — Z880 Allergy status to penicillin: Secondary | ICD-10-CM | POA: Insufficient documentation

## 2012-04-01 DIAGNOSIS — L03211 Cellulitis of face: Secondary | ICD-10-CM | POA: Insufficient documentation

## 2012-04-01 DIAGNOSIS — L0201 Cutaneous abscess of face: Secondary | ICD-10-CM | POA: Insufficient documentation

## 2012-04-01 DIAGNOSIS — Z886 Allergy status to analgesic agent status: Secondary | ICD-10-CM | POA: Insufficient documentation

## 2012-04-01 DIAGNOSIS — L0390 Cellulitis, unspecified: Secondary | ICD-10-CM

## 2012-04-01 MED ORDER — CEPHALEXIN 500 MG PO CAPS
500.0000 mg | ORAL_CAPSULE | Freq: Once | ORAL | Status: AC
  Administered 2012-04-01: 500 mg via ORAL
  Filled 2012-04-01: qty 1

## 2012-04-01 MED ORDER — CEPHALEXIN 500 MG PO CAPS: 500.0000 mg | ORAL_CAPSULE | Freq: Four times a day (QID) | ORAL | Status: AC

## 2012-04-01 NOTE — Discharge Instructions (Signed)
Use Keflex for infection.  Followup with your Dr. if the rash persists, after taking the antibiotics.  Return for worse or uncontrolled symptoms

## 2012-04-01 NOTE — ED Notes (Signed)
Patient reports that she began having facial swelling and redness.  Patient denies changing medications or dosages. Patient denies any breathing, swallowing, or tongue swelling.

## 2012-04-01 NOTE — ED Provider Notes (Signed)
History     CSN: [id number]  Arrival date & time 04/01/12  0821   First MD Initiated Contact with Patient 04/01/12 [phone]      Chief Complaint  Patient presents with  . Facial Swelling    redness    (Consider location/radiation/quality/duration/timing/severity/associated sxs/prior treatment) The history is provided by the patient.  the pt is a 56 y female who c/o a rash to the right side of her face for 4 d. No fever. No n/v/pain.  + itching.   No rash anywhere else  Past Medical History  Diagnosis Date  . Arthritis   . Blood transfusion 2009  . Breast CA 08/2008    (LT) breast ca dx 10/09/Chemo  . Lymphoma 09/09/2011  . Leukemia, acute, in remission 2012    Pt. not sure of type  . Hypertension   . Seizures 1980's    from medication reaction/Pt.    Past Surgical History  Procedure Date  . Mastectomy   . Thyroidectomy   . Appendectomy   . Tonsillectomy     Family History  Problem Relation Age of Onset  . Alcohol abuse Other   . Drug abuse Other   . Breast cancer Mother   . Prostate cancer Father   . Cancer Father   . Colon cancer Neg Hx   . Esophageal cancer Neg Hx   . Rectal cancer Neg Hx   . Stomach cancer Neg Hx     History  Substance Use Topics  . Smoking status: Never Smoker   . Smokeless tobacco: Never Used  . Alcohol Use: No    OB History    Grav Para Term Preterm Abortions TAB SAB Ect Mult Living                  Review of Systems  Constitutional: Negative for chills and fatigue.  HENT: Positive for facial swelling. Negative for sore throat and neck pain.   Gastrointestinal: Negative for nausea and vomiting.  Skin: Positive for rash.  Neurological: Negative for headaches.  Psychiatric/Behavioral: Negative for confusion.  All other systems reviewed and are negative.    Allergies  Codeine; Penicillins; Sulfonamide derivatives; and Tramadol hcl  Home Medications   Current Outpatient Rx  Name Route Sig Dispense Refill  . CELECOXIB  200 MG PO CAPS Oral Take 200 mg by mouth daily as needed.     [name] CETIRIZINE HCL 10 MG PO TABS Oral Take 10 mg by mouth daily.      [name] LETROZOLE 2.5 MG PO TABS Oral Take 1 tablet (2.5 mg total) by mouth daily. 30 tablet 12    BP 117/59  Pulse 68  Temp(Src) 98.1 F (36.7 C) (Oral)  Resp 20  SpO2 99%  Physical Exam  Vitals reviewed. Constitutional: She is oriented to person, place, and time. She appears well-developed and well-nourished.  HENT:  Head: Normocephalic and atraumatic.  Eyes: Conjunctivae and EOM are normal.  Neck: Normal range of motion.  Pulmonary/Chest: Effort normal.  Abdominal: She exhibits no distension.  Musculoskeletal: Normal range of motion. She exhibits no edema.  Neurological: She is alert and oriented to person, place, and time.  Skin: Skin is warm and dry. There is erythema.       + erythema with mild edema over right malar area approx. 4 X4 cm area.  No ttp.   Psychiatric: She has a normal mood and affect. Thought content normal.    ED Course  Procedures (including critical care time)  Labs  Reviewed - No data to display No results found.   No diagnosis found.    MDM  Cellulitis over right cheek. No signs toxicity No distress         Cheri Guppy, MD 04/01/12 [phone]

## 2012-05-20 ENCOUNTER — Other Ambulatory Visit: Payer: Self-pay | Admitting: *Deleted

## 2012-05-24 ENCOUNTER — Telehealth: Payer: Self-pay | Admitting: *Deleted

## 2012-05-24 NOTE — Telephone Encounter (Signed)
patient confirmed over the phone the new date and time on 06-01-2012 for lab only

## 2012-05-31 ENCOUNTER — Other Ambulatory Visit: Payer: Medicare Other | Admitting: Lab

## 2012-06-01 ENCOUNTER — Ambulatory Visit (HOSPITAL_COMMUNITY)
Admission: RE | Admit: 2012-06-01 | Discharge: 2012-06-01 | Disposition: A | Discharge status: Home / Self Care | Payer: Medicare Other | Source: Ambulatory Visit | Attending: Oncology | Admitting: Oncology

## 2012-06-01 ENCOUNTER — Other Ambulatory Visit (HOSPITAL_BASED_OUTPATIENT_CLINIC_OR_DEPARTMENT_OTHER): Payer: Medicare Other | Admitting: Lab

## 2012-06-01 DIAGNOSIS — K7689 Other specified diseases of liver: Secondary | ICD-10-CM | POA: Insufficient documentation

## 2012-06-01 DIAGNOSIS — C8589 Other specified types of non-Hodgkin lymphoma, extranodal and solid organ sites: Secondary | ICD-10-CM | POA: Insufficient documentation

## 2012-06-01 DIAGNOSIS — Z853 Personal history of malignant neoplasm of breast: Secondary | ICD-10-CM | POA: Insufficient documentation

## 2012-06-01 DIAGNOSIS — Z923 Personal history of irradiation: Secondary | ICD-10-CM | POA: Insufficient documentation

## 2012-06-01 DIAGNOSIS — D259 Leiomyoma of uterus, unspecified: Secondary | ICD-10-CM | POA: Insufficient documentation

## 2012-06-01 DIAGNOSIS — C8590 Non-Hodgkin lymphoma, unspecified, unspecified site: Secondary | ICD-10-CM

## 2012-06-01 DIAGNOSIS — K8020 Calculus of gallbladder without cholecystitis without obstruction: Secondary | ICD-10-CM | POA: Insufficient documentation

## 2012-06-01 DIAGNOSIS — R599 Enlarged lymph nodes, unspecified: Secondary | ICD-10-CM | POA: Insufficient documentation

## 2012-06-01 DIAGNOSIS — Z9221 Personal history of antineoplastic chemotherapy: Secondary | ICD-10-CM | POA: Insufficient documentation

## 2012-06-01 LAB — CBC WITH DIFFERENTIAL/PLATELET
BASO%: 0.9 % (ref 0.0–2.0)
Basophils Absolute: 0.1 10*3/uL (ref 0.0–0.1)
EOS%: 3.4 % (ref 0.0–7.0)
HGB: 11.9 g/dL (ref 11.6–15.9)
MCH: 27.6 pg (ref 25.1–34.0)
MCHC: 33.2 g/dL (ref 31.5–36.0)
MCV: 83.1 fL (ref 79.5–101.0)
MONO%: 7 % (ref 0.0–14.0)
RBC: 4.32 10*6/uL (ref 3.70–5.45)
RDW: 14.3 % (ref 11.2–14.5)

## 2012-06-01 LAB — BASIC METABOLIC PANEL - CANCER CENTER ONLY
BUN, Bld: 9 mg/dL (ref 7–22)
Potassium: 3.8 mEq/L (ref 3.3–4.7)

## 2012-06-01 MED ORDER — IOHEXOL 300 MG/ML  SOLN
100.0000 mL | Freq: Once | INTRAMUSCULAR | Status: AC | PRN
  Administered 2012-06-01: 100 mL via INTRAVENOUS

## 2012-06-09 ENCOUNTER — Telehealth: Payer: Self-pay | Admitting: Oncology

## 2012-06-09 ENCOUNTER — Encounter: Payer: Self-pay | Admitting: *Deleted

## 2012-06-09 DIAGNOSIS — C8589 Other specified types of non-Hodgkin lymphoma, extranodal and solid organ sites: Secondary | ICD-10-CM

## 2012-06-09 NOTE — Telephone Encounter (Signed)
lmonvm of the pt's home phone advising her that her appt on 8/8 /had to be cancelled and is now on 06/11/2012@11 :45am

## 2012-06-10 ENCOUNTER — Ambulatory Visit: Payer: Medicare Other

## 2012-06-10 ENCOUNTER — Other Ambulatory Visit: Payer: Medicare Other | Admitting: Lab

## 2012-06-10 ENCOUNTER — Ambulatory Visit: Payer: Medicare Other | Admitting: Family

## 2012-06-10 ENCOUNTER — Ambulatory Visit: Payer: Medicare Other | Admitting: Oncology

## 2012-06-11 ENCOUNTER — Other Ambulatory Visit (HOSPITAL_BASED_OUTPATIENT_CLINIC_OR_DEPARTMENT_OTHER): Payer: Medicare Other | Admitting: Lab

## 2012-06-11 ENCOUNTER — Ambulatory Visit (HOSPITAL_BASED_OUTPATIENT_CLINIC_OR_DEPARTMENT_OTHER): Payer: Medicare Other | Admitting: Oncology

## 2012-06-11 ENCOUNTER — Ambulatory Visit (HOSPITAL_BASED_OUTPATIENT_CLINIC_OR_DEPARTMENT_OTHER): Payer: Medicare Other

## 2012-06-11 ENCOUNTER — Telehealth: Payer: Self-pay | Admitting: Oncology

## 2012-06-11 ENCOUNTER — Other Ambulatory Visit: Payer: Self-pay | Admitting: *Deleted

## 2012-06-11 ENCOUNTER — Encounter: Payer: Self-pay | Admitting: Oncology

## 2012-06-11 VITALS — BP 131/73 | HR 82 | Temp 97.7°F | Resp 20

## 2012-06-11 VITALS — BP 116/78 | HR 65 | Temp 98.5°F | Resp 20 | Ht 62.0 in | Wt 140.6 lb

## 2012-06-11 DIAGNOSIS — C8589 Other specified types of non-Hodgkin lymphoma, extranodal and solid organ sites: Secondary | ICD-10-CM

## 2012-06-11 DIAGNOSIS — C50919 Malignant neoplasm of unspecified site of unspecified female breast: Secondary | ICD-10-CM

## 2012-06-11 DIAGNOSIS — C8300 Small cell B-cell lymphoma, unspecified site: Secondary | ICD-10-CM

## 2012-06-11 DIAGNOSIS — C8590 Non-Hodgkin lymphoma, unspecified, unspecified site: Secondary | ICD-10-CM

## 2012-06-11 DIAGNOSIS — Z5112 Encounter for antineoplastic immunotherapy: Secondary | ICD-10-CM

## 2012-06-11 LAB — CBC WITH DIFFERENTIAL/PLATELET
Eosinophils Absolute: 0.2 10*3/uL (ref 0.0–0.5)
HGB: 11.6 g/dL (ref 11.6–15.9)
MONO#: 0.9 10*3/uL (ref 0.1–0.9)
NEUT#: 7.1 10*3/uL — ABNORMAL HIGH (ref 1.5–6.5)
Platelets: 217 10*3/uL (ref 145–400)
RBC: 4.29 10*6/uL (ref 3.70–5.45)
RDW: 14.2 % (ref 11.2–14.5)
WBC: 10 10*3/uL (ref 3.9–10.3)
nRBC: 0 % (ref 0–0)

## 2012-06-11 MED ORDER — ACETAMINOPHEN 325 MG PO TABS
650.0000 mg | ORAL_TABLET | Freq: Once | ORAL | Status: AC
  Administered 2012-06-11: 650 mg via ORAL

## 2012-06-11 MED ORDER — SODIUM CHLORIDE 0.9 % IV SOLN
Freq: Once | INTRAVENOUS | Status: AC
  Administered 2012-06-11: 13:00:00 via INTRAVENOUS

## 2012-06-11 MED ORDER — DIPHENHYDRAMINE HCL 25 MG PO CAPS
50.0000 mg | ORAL_CAPSULE | Freq: Once | ORAL | Status: AC
  Administered 2012-06-11: 50 mg via ORAL

## 2012-06-11 MED ORDER — SODIUM CHLORIDE 0.9 % IV SOLN
375.0000 mg/m2 | Freq: Once | INTRAVENOUS | Status: AC
  Administered 2012-06-11: 600 mg via INTRAVENOUS
  Filled 2012-06-11: qty 60

## 2012-06-11 NOTE — Patient Instructions (Addendum)
1. Proceed with weekly rituxan   2. I will see you back on 07/02/12

## 2012-06-11 NOTE — Telephone Encounter (Signed)
S/w Helen [name] in the infusion room and they will gve the pt her aug 2013 updated appt schedule

## 2012-06-11 NOTE — Progress Notes (Signed)
OFFICE PROGRESS NOTE  CC  [name], MD [address] [facility] [address] [place], [address] [place] [address]  DIAGNOSIS: Your-year-old female with:  #1 take left breast carcinoma originally presenting as a fungating mass. Patient underwent neoadjuvant chemotherapy followed by mastectomy. She is currently on letrozole 2.5 mg daily.  #2 low-grade non-Hodgkin lymphoma status post CVP and Rituxan now receiving maintenance Rituxan.  PRIOR THERAPY:  #1 the patient was diagnosed with metastatic breast carcinoma beginning October 2009. She returned her 1 chemotherapy followed by mastectomy radiation. She has been on letrozole 2.5 mg daily tolerating it well.  #2 Was diagnosed with low-grade non-Hodgkin lymphoma  12. She received 6 cycles of CVP and Rituxan. Last week for maintenance Rituxan. She is to receive 4 weeks in a row every 6 months for a total of 2 years.  CURRENT THERAPY:Is here for maintenance Rituxan  Week #4/4  INTERVAL HISTORY: Helen [name] 77 y.o. female returns for Visit today. Overall she is doing well. Patient has become quite forgetful. On her last visit with Colman Cater patient was unclear why she was seeing [place]. She was explained the role of maintenance Rituxan by Colman Cater. But in spite of that patient still remains quite confused and this is quite concerning. She otherwise denies any fevers chills night sweats headaches shortness of breath chest pains palpitations she has no myalgias or arthralgias. He material hematochezia melena hemoptysis or hematemesis. She denies having any peripheral paresthesias no weight problems no gait disturbances. Remainder of the 10 point review of systems is negative.  MEDICAL HISTORY: Past Medical History  Diagnosis Date  . Arthritis   . Blood transfusion 2009  . Breast CA 08/2008    (LT) breast ca dx 10/09/Chemo  . Lymphoma 09/09/2011  . Leukemia, acute, in remission 2012    Pt. not sure of type  . Hypertension   . Seizures  1980's    from medication reaction/Pt.    ALLERGIES:  is allergic to codeine; penicillins; sulfonamide derivatives; and tramadol hcl.  MEDICATIONS:  Current Outpatient Prescriptions  Medication Sig Dispense Refill  . celecoxib (CELEBREX) 200 MG capsule Take 200 mg by mouth daily as needed.       . cetirizine (ZYRTEC) 10 MG tablet Take 10 mg by mouth daily.        [name] letrozole (FEMARA) 2.5 MG tablet Take 1 tablet (2.5 mg total) by mouth daily.  30 tablet  12   Current Facility-Administered Medications  Medication Dose Route Frequency Provider Last Rate Last Dose  . 0.9 %  sodium chloride infusion  500 mL Intravenous Continuous Louis Meckel, MD        SURGICAL HISTORY:  Past Surgical History  Procedure Date  . Mastectomy   . Thyroidectomy   . Appendectomy   . Tonsillectomy     REVIEW OF SYSTEMS:  Pertinent items are noted in HPI.   PHYSICAL EXAMINATION: General appearance: alert, cooperative and appears stated age Neck: no adenopathy, no carotid bruit, no JVD, supple, symmetrical, trachea midline and thyroid not enlarged, symmetric, no tenderness/mass/nodules Lymph nodes: Cervical, supraclavicular, and axillary nodes normal. Resp: clear to auscultation bilaterally and normal percussion bilaterally Back: symmetric, no curvature. ROM normal. No CVA tenderness. Cardio: regular rate and rhythm, S1, S2 normal, no murmur, click, rub or gallop and normal apical impulse GI: soft, non-tender; bowel sounds normal; no masses,  no organomegaly Extremities: extremities normal, atraumatic, no cyanosis or edema Neurologic: Alert and oriented X 3, normal strength and tone.  Normal symmetric reflexes. Normal coordination and gait  ECOG PERFORMANCE STATUS: 1 - Symptomatic but completely ambulatory  Blood pressure 116/78, pulse 65, temperature 98.5 F (36.9 C), temperature source Oral, resp. rate 20, height 5\' 2"  (1.575 m), weight 140 lb 9.6 oz (63.776 kg).  LABORATORY DATA: Lab Results    Component Value Date   WBC 10.0 06/11/2012   HGB 11.6 06/11/2012   HCT 35.4 06/11/2012   MCV 82.5 06/11/2012   PLT 217 06/11/2012      Chemistry      Component Value Date/Time   NA 142 06/01/2012 1008   NA 141 02/10/2012 1035   K 3.8 06/01/2012 1008   K 4.0 02/10/2012 1035   CL 103 06/01/2012 1008   CL 107 02/10/2012 1035   CO2 25 06/01/2012 1008   CO2 26 02/10/2012 1035   BUN 9 06/01/2012 1008   BUN 11 02/10/2012 1035   CREATININE 0.9 06/01/2012 1008   CREATININE 0.7 02/10/2012 1035      Component Value Date/Time   CALCIUM 9.8 06/01/2012 1008   CALCIUM 9.8 02/10/2012 1035   ALKPHOS 69 02/10/2012 1035   ALKPHOS 78 08/14/2010 1251   AST 20 02/10/2012 1035   AST 19 08/14/2010 1251   ALT 19 02/10/2012 1035   BILITOT 0.4 02/10/2012 1035   BILITOT 0.40 08/14/2010 1251       RADIOGRAPHIC STUDIES: CT CHEST, ABDOMEN AND PELVIS WITH CONTRAST  Technique: Multidetector CT imaging of the chest, abdomen and  pelvis was performed following the standard protocol during bolus  administration of intravenous contrast.  Contrast: OMNIPAQUE IOHEXOL 300 MG/ML SOLN  Comparison: Prior examinations 07/22/2011.  CT CHEST  Findings: There has been interval development of multiple  homogeneously enlarged right axillary and subpectoral lymph nodes.  The largest axillary node measures 2.9 x 3.7 cm on image 24.  Mildly enlarged right supraclavicular nodes are also noted, best  seen on the reformatted images.  There are no significantly enlarged left axillary node status post  left mastectomy. There is a 7 mm prevascular node on image 23  which has enlarged. In addition, there are small retrocrural and  distal para-aortic lymph nodes which have enlarged, the largest  measuring 10 mm on image 45.  There is no pleural or pericardial effusion. There is mildly  increased nodularity along the inferior aspect of the right major  fissure on image 37. Lateral subpleural nodular density on image  36 is stable. No intrapulmonary  nodule or endobronchial lesion is  seen. There are stable chronic fibrotic changes in the left upper  lobe anteriorly related to radiation therapy.  Port-A-Cath position is stable. There are no suspicious osseous  findings.  IMPRESSION:  1. Interval development of multiple homogeneously enlarged right  supraclavicular, subpectoral and axillary lymph nodes most  consistent with recurrent lymphoma. Small mediastinal lymph nodes  have also enlarged.  2. Stable radiation changes in the left upper lobe.  3. Minimally increased nodularity along the inferior aspect of the  right major fissure, likely incidental.  CT ABDOMEN AND PELVIS  Findings: There has been interval development of multiple mildly  enlarged lymph nodes in the upper abdomen. Representative lesions  include an 11 mm lesion in the porta hepatis on image 55, and 9 mm  portacaval node on image 59, and a 12 mm node superior to the  pancreatic head on image 61. In addition, there are several mildly  enlarged retroperitoneal lymph nodes. Within the pelvis, there is  asymmetric adenopathy  along the left pelvic sidewall, largest  measuring 1.5 cm short axis on image 91. Inguinal lymph nodes are  not pathologically enlarged, although have increased in size and  number compared with the prior study.  There is an ill-defined low density lesion inferiorly in the right  hepatic lobe measuring 1.5 cm on image 58. Although questionably  seen on some of the patient's prior studies, this appears slightly  larger. It is not visible on the delayed images. No other liver  lesions are seen. The spleen appears normal. The gallbladder,  pancreas and adrenal glands appear normal. Small renal cysts  bilaterally have not significantly changed. There is no  hydronephrosis.  There is no evidence of bowel obstruction or lymphadenopathy.  Sigmoid colon diverticular changes are stable. There are stable  calcified uterine fibroids.  Mixed sclerotic and  lytic lesion in the right iliac bone appears  unchanged. There is no associated soft tissue mass. No new  osseous lesions are identified.  IMPRESSION:  1. Interval development of abdominal and left pelvic adenopathy  most consistent with lymphoma as correlated with the chest  findings.  2. Single low density hepatic lesion appears larger although it is  questionably present on some of the patient's prior studies. This  could reflect an incidental hemangioma although lymphomatous  involvement is difficult to completely exclude.  3. Stable chronic right iliac osseous metastasis.  Original Report Authenticated By: Gerrianne Scale, M.D.         ASSESSMENT: 77 year old female with  #1Patient with history of low-grade non-Hodgkin lymphoma she initially received CVP Rituxan and then subsequently on maintenance Rituxan. She has not returned for followup maintenance and had CT scans performed that does show recurrence of her lymphoma. Cancer as above.This was discussed with the patient today.  #2 metastatic breast carcinoma originally diagnosed in 2009 now maintained on letrozole 2.5 mg daily.   PLAN:   #1 patient will proceed with her scheduled Rituxan.However I do think that the patient will need systemic chemotherapy. I think the addition of treanda (bendamustine). I will do someliterature search for her and I will plan on discussing this with her on her next visit. I do think that patient right now is insignificant shock. She will need a PET scan as well as now get this set up.  #2.I will plan on seeing her back in about 2-3 weeks' time.  All questions were answered. The patient knows to call the clinic with any problems, questions or concerns. We can certainly see the patient much sooner if necessary.  I spent 25 minutes counseling the patient face to face. The total time spent in the appointment was 30 minutes.    Drue Second, MD Medical/Oncology [facility] [phone] (beeper) [phone] (Office)  06/11/2012, 12:44 PM

## 2012-06-16 ENCOUNTER — Telehealth: Payer: Self-pay | Admitting: Oncology

## 2012-06-16 NOTE — Telephone Encounter (Signed)
lmonvm adviisng the pt of her pet scan appt in aug with instructions

## 2012-06-17 ENCOUNTER — Telehealth: Payer: Self-pay | Admitting: Medical Oncology

## 2012-06-17 ENCOUNTER — Other Ambulatory Visit: Payer: Self-pay | Admitting: Medical Oncology

## 2012-06-17 ENCOUNTER — Telehealth: Payer: Self-pay | Admitting: *Deleted

## 2012-06-17 NOTE — Telephone Encounter (Signed)
Spoke to patient regarding appointment change: new appointment 8/27 lab @ 1130, MD @ 1200.  Patient expressed understanding, confirmed date/time.

## 2012-06-17 NOTE — Telephone Encounter (Signed)
Sent michelle an email requesting the patient receive treatment from 07-02-2012 to 06-29-2012

## 2012-06-18 ENCOUNTER — Other Ambulatory Visit: Payer: Self-pay | Admitting: Oncology

## 2012-06-18 ENCOUNTER — Telehealth: Payer: Self-pay | Admitting: *Deleted

## 2012-06-18 ENCOUNTER — Other Ambulatory Visit: Payer: Self-pay | Admitting: *Deleted

## 2012-06-18 ENCOUNTER — Ambulatory Visit (HOSPITAL_BASED_OUTPATIENT_CLINIC_OR_DEPARTMENT_OTHER): Payer: Medicare Other

## 2012-06-18 VITALS — BP 97/57 | HR 77 | Temp 98.9°F | Resp 24

## 2012-06-18 DIAGNOSIS — Z5112 Encounter for antineoplastic immunotherapy: Secondary | ICD-10-CM

## 2012-06-18 DIAGNOSIS — N390 Urinary tract infection, site not specified: Secondary | ICD-10-CM

## 2012-06-18 DIAGNOSIS — C8590 Non-Hodgkin lymphoma, unspecified, unspecified site: Secondary | ICD-10-CM

## 2012-06-18 DIAGNOSIS — C8589 Other specified types of non-Hodgkin lymphoma, extranodal and solid organ sites: Secondary | ICD-10-CM

## 2012-06-18 LAB — URINALYSIS, MICROSCOPIC - CHCC
Glucose: NEGATIVE g/dL
Ketones: NEGATIVE mg/dL
Protein: NEGATIVE mg/dL
RBC / HPF: NEGATIVE (ref 0–2)

## 2012-06-18 MED ORDER — ACETAMINOPHEN 325 MG PO TABS
650.0000 mg | ORAL_TABLET | Freq: Once | ORAL | Status: AC
  Administered 2012-06-18: 650 mg via ORAL

## 2012-06-18 MED ORDER — SODIUM CHLORIDE 0.9 % IJ SOLN
10.0000 mL | INTRAMUSCULAR | Status: DC | PRN
  Administered 2012-06-18: 10 mL
  Filled 2012-06-18: qty 10

## 2012-06-18 MED ORDER — CIPROFLOXACIN HCL 500 MG PO TABS: 500.0000 mg | ORAL_TABLET | Freq: Two times a day (BID) | ORAL | Status: AC

## 2012-06-18 MED ORDER — HEPARIN SOD (PORK) LOCK FLUSH 100 UNIT/ML IV SOLN
500.0000 [IU] | Freq: Once | INTRAVENOUS | Status: AC | PRN
  Administered 2012-06-18: 500 [IU]
  Filled 2012-06-18: qty 5

## 2012-06-18 MED ORDER — SODIUM CHLORIDE 0.9 % IV SOLN
375.0000 mg/m2 | Freq: Once | INTRAVENOUS | Status: AC
  Administered 2012-06-18: 600 mg via INTRAVENOUS
  Filled 2012-06-18: qty 60

## 2012-06-18 MED ORDER — SODIUM CHLORIDE 0.9 % IV SOLN
Freq: Once | INTRAVENOUS | Status: AC
  Administered 2012-06-18: 10:00:00 via INTRAVENOUS

## 2012-06-18 MED ORDER — DIPHENHYDRAMINE HCL 25 MG PO CAPS
50.0000 mg | ORAL_CAPSULE | Freq: Once | ORAL | Status: AC
  Administered 2012-06-18: 50 mg via ORAL

## 2012-06-18 NOTE — Telephone Encounter (Signed)
Per MD, LMOVM-Notified pt slight UTI. Begin Cipro 500mg  BID. Rx has been sent to pharmacy. Requested pt call back to confirm message received.

## 2012-06-18 NOTE — Progress Notes (Signed)
Pt has increased frequency of urination and increased odor. Pt has low grade temp. Dr Welton Flakes notified and UA/culture ordered.

## 2012-06-18 NOTE — Telephone Encounter (Signed)
Message copied by Cooper Render on Fri Jun 18, 2012  5:38 PM ------      Message from: Helen [name]      Created: Fri Jun 18, 2012  5:27 PM       Call patient: patient has a slight urinary tract infection.  Begin Cipro 500 gm BID , I sent script to pharmacy

## 2012-06-18 NOTE — Progress Notes (Signed)
1115 temp 100.4 Spoke with Dr. Welton Flakes and continued treatment.

## 2012-06-18 NOTE — Patient Instructions (Signed)
[facility] Discharge Instructions for Patients Receiving Chemotherapy  Today you received the following chemotherapy agents Rituxan.   If you develop nausea and vomiting that is not controlled by your nausea medication, call the clinic. If it is after clinic hours your family physician or the after hours number for the clinic or go to the Emergency Department.   BELOW ARE SYMPTOMS THAT SHOULD BE REPORTED IMMEDIATELY:  *FEVER GREATER THAN 100.5 F  *CHILLS WITH OR WITHOUT FEVER  NAUSEA AND VOMITING THAT IS NOT CONTROLLED WITH YOUR NAUSEA MEDICATION  *UNUSUAL SHORTNESS OF BREATH  *UNUSUAL BRUISING OR BLEEDING  TENDERNESS IN MOUTH AND THROAT WITH OR WITHOUT PRESENCE OF ULCERS  *URINARY PROBLEMS  *BOWEL PROBLEMS  UNUSUAL RASH Items with * indicate a potential emergency and should be followed up as soon as possible.   Feel free to call the clinic you have any questions or concerns. The clinic phone number is [phone].   I have been informed and understand all the instructions given to me. I know to contact the clinic, my physician, or go to the Emergency Department if any problems should occur. I do not have any questions at this time, but understand that I may call the clinic during office hours   should I have any questions or need assistance in obtaining follow up care.    __________________________________________  _____________  __________ Signature of Patient or Authorized Representative            Date                   Time    __________________________________________ Nurse's Signature

## 2012-06-19 LAB — URINE CULTURE

## 2012-06-21 NOTE — Telephone Encounter (Signed)
Called pt regarding Cipro and pt to be seen 8/22 lab 0915 MD 0930 prior to treatment on 8/23. LMOVM for pt to call back regarding appts and times.

## 2012-06-24 ENCOUNTER — Ambulatory Visit (HOSPITAL_COMMUNITY)
Admission: RE | Admit: 2012-06-24 | Discharge: 2012-06-24 | Disposition: A | Discharge status: Home / Self Care | Payer: Medicare Other | Source: Ambulatory Visit | Attending: Oncology | Admitting: Oncology

## 2012-06-24 ENCOUNTER — Encounter (HOSPITAL_COMMUNITY): Payer: Self-pay

## 2012-06-24 DIAGNOSIS — D259 Leiomyoma of uterus, unspecified: Secondary | ICD-10-CM | POA: Insufficient documentation

## 2012-06-24 DIAGNOSIS — R599 Enlarged lymph nodes, unspecified: Secondary | ICD-10-CM | POA: Insufficient documentation

## 2012-06-24 DIAGNOSIS — C7951 Secondary malignant neoplasm of bone: Secondary | ICD-10-CM | POA: Insufficient documentation

## 2012-06-24 DIAGNOSIS — Z87898 Personal history of other specified conditions: Secondary | ICD-10-CM | POA: Insufficient documentation

## 2012-06-24 DIAGNOSIS — C8300 Small cell B-cell lymphoma, unspecified site: Secondary | ICD-10-CM

## 2012-06-24 DIAGNOSIS — C50919 Malignant neoplasm of unspecified site of unspecified female breast: Secondary | ICD-10-CM | POA: Insufficient documentation

## 2012-06-24 DIAGNOSIS — K8020 Calculus of gallbladder without cholecystitis without obstruction: Secondary | ICD-10-CM | POA: Insufficient documentation

## 2012-06-24 MED ORDER — FLUDEOXYGLUCOSE F - 18 (FDG) INJECTION
18.8000 | Freq: Once | INTRAVENOUS | Status: AC | PRN
  Administered 2012-06-24: 18.8 via INTRAVENOUS

## 2012-06-25 ENCOUNTER — Ambulatory Visit (HOSPITAL_BASED_OUTPATIENT_CLINIC_OR_DEPARTMENT_OTHER): Payer: Medicare Other

## 2012-06-25 VITALS — BP 114/66 | HR 73 | Temp 97.1°F | Resp 20

## 2012-06-25 DIAGNOSIS — C8590 Non-Hodgkin lymphoma, unspecified, unspecified site: Secondary | ICD-10-CM

## 2012-06-25 DIAGNOSIS — C8589 Other specified types of non-Hodgkin lymphoma, extranodal and solid organ sites: Secondary | ICD-10-CM

## 2012-06-25 DIAGNOSIS — Z5112 Encounter for antineoplastic immunotherapy: Secondary | ICD-10-CM

## 2012-06-25 MED ORDER — ACETAMINOPHEN 325 MG PO TABS
650.0000 mg | ORAL_TABLET | Freq: Once | ORAL | Status: AC
  Administered 2012-06-25: 650 mg via ORAL

## 2012-06-25 MED ORDER — SODIUM CHLORIDE 0.9 % IJ SOLN
10.0000 mL | INTRAMUSCULAR | Status: DC | PRN
  Administered 2012-06-25: 10 mL
  Filled 2012-06-25: qty 10

## 2012-06-25 MED ORDER — DIPHENHYDRAMINE HCL 25 MG PO CAPS
50.0000 mg | ORAL_CAPSULE | Freq: Once | ORAL | Status: AC
  Administered 2012-06-25: 50 mg via ORAL

## 2012-06-25 MED ORDER — SODIUM CHLORIDE 0.9 % IV SOLN
375.0000 mg/m2 | Freq: Once | INTRAVENOUS | Status: AC
  Administered 2012-06-25: 600 mg via INTRAVENOUS
  Filled 2012-06-25: qty 60

## 2012-06-25 MED ORDER — SODIUM CHLORIDE 0.9 % IV SOLN
Freq: Once | INTRAVENOUS | Status: AC
  Administered 2012-06-25: 08:00:00 via INTRAVENOUS

## 2012-06-25 MED ORDER — HEPARIN SOD (PORK) LOCK FLUSH 100 UNIT/ML IV SOLN
500.0000 [IU] | Freq: Once | INTRAVENOUS | Status: AC | PRN
  Administered 2012-06-25: 500 [IU]
  Filled 2012-06-25: qty 5

## 2012-06-25 NOTE — Patient Instructions (Signed)
Patient aware of next appointment; discharged home with no complaints. 

## 2012-06-29 ENCOUNTER — Encounter: Payer: Self-pay | Admitting: Oncology

## 2012-06-29 ENCOUNTER — Other Ambulatory Visit (HOSPITAL_BASED_OUTPATIENT_CLINIC_OR_DEPARTMENT_OTHER): Payer: Medicare Other | Admitting: Lab

## 2012-06-29 ENCOUNTER — Ambulatory Visit (HOSPITAL_BASED_OUTPATIENT_CLINIC_OR_DEPARTMENT_OTHER): Payer: Medicare Other | Admitting: Oncology

## 2012-06-29 VITALS — BP 116/72 | HR 76 | Temp 98.7°F | Resp 20 | Ht 62.0 in | Wt 137.3 lb

## 2012-06-29 DIAGNOSIS — C8589 Other specified types of non-Hodgkin lymphoma, extranodal and solid organ sites: Secondary | ICD-10-CM

## 2012-06-29 DIAGNOSIS — C8590 Non-Hodgkin lymphoma, unspecified, unspecified site: Secondary | ICD-10-CM

## 2012-06-29 DIAGNOSIS — C50919 Malignant neoplasm of unspecified site of unspecified female breast: Secondary | ICD-10-CM

## 2012-06-29 LAB — CBC WITH DIFFERENTIAL/PLATELET
BASO%: 0.8 % (ref 0.0–2.0)
Basophils Absolute: 0.1 10*3/uL (ref 0.0–0.1)
EOS%: 1.3 % (ref 0.0–7.0)
Eosinophils Absolute: 0.1 10*3/uL (ref 0.0–0.5)
HCT: 32 % — ABNORMAL LOW (ref 34.8–46.6)
HGB: 10.3 g/dL — ABNORMAL LOW (ref 11.6–15.9)
LYMPH%: 6.5 % — ABNORMAL LOW (ref 14.0–49.7)
MCH: 26.6 pg (ref 25.1–34.0)
MCHC: 32.2 g/dL (ref 31.5–36.0)
MCV: 82.6 fL (ref 79.5–101.0)
MONO#: 0.6 10*3/uL (ref 0.1–0.9)
MONO%: 7.1 % (ref 0.0–14.0)
NEUT#: 6.7 10*3/uL — ABNORMAL HIGH (ref 1.5–6.5)
NEUT%: 84.3 % — ABNORMAL HIGH (ref 38.4–76.8)
Platelets: 402 10*3/uL — ABNORMAL HIGH (ref 145–400)
RBC: 3.88 10*6/uL (ref 3.70–5.45)
RDW: 14.2 % (ref 11.2–14.5)
WBC: 8 10*3/uL (ref 3.9–10.3)
lymph#: 0.5 10*3/uL — ABNORMAL LOW (ref 0.9–3.3)

## 2012-06-29 LAB — COMPREHENSIVE METABOLIC PANEL (CC13)
ALT: 68 U/L — ABNORMAL HIGH (ref 0–55)
AST: 26 U/L (ref 5–34)
Albumin: 3 g/dL — ABNORMAL LOW (ref 3.5–5.0)
Alkaline Phosphatase: 131 U/L (ref 40–150)
BUN: 10 mg/dL (ref 7.0–26.0)
CO2: 25 mEq/L (ref 22–29)
Chloride: 105 mEq/L (ref 98–107)
Creatinine: 0.8 mg/dL (ref 0.6–1.1)
Glucose: 104 mg/dl — ABNORMAL HIGH (ref 70–99)
Potassium: 4.1 mEq/L (ref 3.5–5.1)
Sodium: 138 mEq/L (ref 136–145)
Total Bilirubin: 0.3 mg/dL (ref 0.20–1.20)
Total Protein: 6.4 g/dL (ref 6.4–8.3)

## 2012-06-29 LAB — CORRECTED CALCIUM (CC13): Calcium, Corrected: 10.4 mg/dL (ref 8.4–10.4)

## 2012-06-29 NOTE — Progress Notes (Signed)
OFFICE PROGRESS NOTE  CC  [name], MD [address] [facility] [address] [place], [address] [place] [address]  DIAGNOSIS: Your-year-old female with:  #1 take left breast carcinoma originally presenting as a fungating mass. Patient underwent neoadjuvant chemotherapy followed by mastectomy. She is currently on letrozole 2.5 mg daily.  #2 low-grade non-Hodgkin lymphoma status post CVP and Rituxan now receiving maintenance Rituxan.  PRIOR THERAPY:  #1 the patient was diagnosed with metastatic breast carcinoma beginning October 2009. She returned her 1 chemotherapy followed by mastectomy radiation. She has been on letrozole 2.5 mg daily tolerating it well.  #2 Was diagnosed with low-grade non-Hodgkin lymphoma  12. She received 6 cycles of CVP and Rituxan. Last week for maintenance Rituxan. She is to receive 4 weeks in a row every 6 months for a total of 2 years.  CURRENT THERAPY: here for maintenance Rituxan  Week #4/4  INTERVAL HISTORY: Helen [name] 77 y.o. female returns for Visit today. She will proceed with week 4 of Rituxan. I have discussed the PET scan results with the patient today I also wrote out all of the diagnoses that she has. [place] also discussed with her that she should speak to her family and bring a family member with her at her next visit with me which will be in September of this after Labor Day.. Patient at this time to demonstrated understanding. Patient does have an area of concern in the lumbar spine and I will obtain an MRI. She otherwise has no complaints. MEDICAL HISTORY: Past Medical History  Diagnosis Date  . Arthritis   . Blood transfusion 2009  . Breast CA 08/2008    (LT) breast ca dx 10/09/Chemo  . Lymphoma 09/09/2011  . Leukemia, acute, in remission 2012    Pt. not sure of type  . Hypertension   . Seizures 1980's    from medication reaction/Pt.    ALLERGIES:  is allergic to codeine; penicillins; sulfonamide derivatives; and tramadol hcl.  MEDICATIONS:   Current Outpatient Prescriptions  Medication Sig Dispense Refill  . celecoxib (CELEBREX) 200 MG capsule Take 200 mg by mouth daily as needed.       . cetirizine (ZYRTEC) 10 MG tablet Take 10 mg by mouth daily.        [name] letrozole (FEMARA) 2.5 MG tablet Take 1 tablet (2.5 mg total) by mouth daily.  30 tablet  12  . ciprofloxacin (CIPRO) 500 MG tablet Take 1 tablet (500 mg total) by mouth 2 (two) times daily.  10 tablet  0   Current Facility-Administered Medications  Medication Dose Route Frequency Provider Last Rate Last Dose  . 0.9 %  sodium chloride infusion  500 mL Intravenous Continuous Louis Meckel, MD        SURGICAL HISTORY:  Past Surgical History  Procedure Date  . Mastectomy   . Thyroidectomy   . Appendectomy   . Tonsillectomy     REVIEW OF SYSTEMS:  Pertinent items are noted in HPI.   PHYSICAL EXAMINATION: General appearance: alert, cooperative and appears stated age Neck: no adenopathy, no carotid bruit, no JVD, supple, symmetrical, trachea midline and thyroid not enlarged, symmetric, no tenderness/mass/nodules Lymph nodes: Cervical, supraclavicular, and axillary nodes normal. Resp: clear to auscultation bilaterally and normal percussion bilaterally Back: symmetric, no curvature. ROM normal. No CVA tenderness. Cardio: regular rate and rhythm, S1, S2 normal, no murmur, click, rub or gallop and normal apical impulse GI: soft, non-tender; bowel sounds normal; no masses,  no organomegaly Extremities: extremities  normal, atraumatic, no cyanosis or edema Neurologic: Alert and oriented X 3, normal strength and tone. Normal symmetric reflexes. Normal coordination and gait  ECOG PERFORMANCE STATUS: 1 - Symptomatic but completely ambulatory  Blood pressure 116/72, pulse 76, temperature 98.7 F (37.1 C), temperature source Oral, resp. rate 20, height 5\' 2"  (1.575 m), weight 137 lb 4.8 oz (62.279 kg).  LABORATORY DATA: Lab Results  Component Value Date   WBC 8.0 06/29/2012     HGB 10.3* 06/29/2012   HCT 32.0* 06/29/2012   MCV 82.6 06/29/2012   PLT 402* 06/29/2012      Chemistry      Component Value Date/Time   NA 138 06/29/2012 1144   NA 142 06/01/2012 1008   NA 141 02/10/2012 1035   K 4.1 06/29/2012 1144   K 3.8 06/01/2012 1008   K 4.0 02/10/2012 1035   CL 105 06/29/2012 1144   CL 103 06/01/2012 1008   CL 107 02/10/2012 1035   CO2 25 06/29/2012 1144   CO2 25 06/01/2012 1008   CO2 26 02/10/2012 1035   BUN 10.0 06/29/2012 1144   BUN 9 06/01/2012 1008   BUN 11 02/10/2012 1035   CREATININE 0.8 06/29/2012 1144   CREATININE 0.9 06/01/2012 1008   CREATININE 0.7 02/10/2012 1035      Component Value Date/Time   CALCIUM 9.8 06/01/2012 1008   CALCIUM 9.8 02/10/2012 1035   ALKPHOS 131 06/29/2012 1144   ALKPHOS 69 02/10/2012 1035   ALKPHOS 78 08/14/2010 1251   AST 26 06/29/2012 1144   AST 20 02/10/2012 1035   AST 19 08/14/2010 1251   ALT 68* 06/29/2012 1144   ALT 19 02/10/2012 1035   BILITOT 0.30 06/29/2012 1144   BILITOT 0.4 02/10/2012 1035   BILITOT 0.40 08/14/2010 1251       RADIOGRAPHIC STUDIES: NUCLEAR MEDICINE PET CT SKULL BASE TO THIGH  Technique: Technique: 18.8 mCi F-18 FDG was injected  intravenously. CT data was obtained and used for attenuation  correction and anatomic localization only. (This was not acquired  as a diagnostic CT examination.) Additional exam technical data  entered on technologist worksheet.  Comparison: PET of 02/07/2011. CTs of 06/01/2012.  Findings: Neck: Hypermetabolism within the left side of the  mandible, with suspicion of concurrent periapical focal lucency on  image 24.  Bilateral cervical hypermetabolic lymph nodes. Hypermetabolism  which is primarily felt to be nodal surrounds the right jugular  vein and extends into the thoracic inlet. This measures a S.U.V.  max of 9.9 on image 57  Chest: Hypermetabolic right axillary node. This measures 1.7 cm  and a S.U.V. max of 6.1 on image 82. On the prior PET, this node  measured 1.7 cm and a  S.U.V. max of 3.1.  Small prevascular nodes which are hypermetabolic, including on  image 72.  Abdomen/Pelvis: Right adrenal hypermetabolism which is without CT  correlate. Hypermetabolic left external iliac adenopathy. This  measures 1.3 cm and a S.U.V. max of 5.0 on image 181. On the prior  PET, this node measured 1.6 cm and on the order of a S.U.V. max of  2.6.  [place]: Multifocal marrow hypermetabolism consistent with osseous  involvement. Index lesion in the right humeral head measures a  S.U.V. max of 14.4 on image 53 and is new.  A hypermetabolic lytic lesion within the left iliac wing is new or  progressive and measures a S.U.V. max of 8.1 on image 155.  CT images performed for attenuation correction demonstrate  increased  number and size of lymph nodes within the neck. Index  right jugulodigastric node measures 9 mm short axis on image 34  versus 6 mm on the prior PET. Chest, abdomen, and pelvic findings  deferred to recent diagnostic CTs. Trace right-sided pleural fluid  is new. Left upper lobe radiation fibrosis. Punctate left renal  calculi. Cholelithiasis. Fibroid uterus. L4 osseous metastasis  has possible epidural component on image 144 transverse.  IMPRESSION:  1. Increasing hypermetabolism associated with adenopathy within  the neck, chest, and pelvis, as detailed above. Findings are most  likely indicative of progression of lymphoma.  2. Progressive osseous metastasis, as detailed above. An L4 lesion  has possible epidural component. Consider pre and post contrast  lumbar spine MRI.  3. Hypermetabolism at the right lower neck and thoracic inlet  surrounds the right internal jugular vein. This appears somewhat  hyperattenuating. Cannot exclude thrombus. Consider right upper  extremity venous ultrasound with attention to this area.  4. Likely dental inflammatory left mandibular hypermetabolism.  Consider physical exam correlation.   ASSESSMENT: 77 year old female  with  #1Patient with history of low-grade non-Hodgkin lymphoma she initially received CVP Rituxan and then subsequently on maintenance Rituxan.   #2 metastatic breast carcinoma originally diagnosed in 2009 now maintained on letrozole 2.5 mg daily.  #3 patient with recurrent low-grade lymphoma she will begin Rituxan and creatinine.in one month's time.   PLAN:   #1 patient will proceed with her scheduled Rituxan.However I do think that the patient will need systemic chemotherapy. I think the addition of treanda (bendamustine). I will do some literature search for her and I will plan on discussing this with her on her next visit. I do think that patient right now is insignificant shock.   #2 patient had a PET scan performed which does indeed confirm recurrence of disease. I do think that we need to add tree and that too patient's regimen. I have discussed this with her. I explained once again patient's diagnosis of lymphoma as well as her breast cancer. I do think patient does not have a grasp on her diagnoses at all. She didn't seem to be quite in shock even though I have explained this to her over and over again on her previous visits. This time I did discuss with the patient that she should be having her family, with her because the treatments are going to be quite complex. At least during the visit patient did demonstrate understanding.  #3 patient will be seen back In a few weeks time.  #4 I will get an MRI of the lumbar spine. If she needs radiation therapy in the spinal region then we will proceed with that first prior to giving her chemotherapy. All of this is explained to the patient.  All questions were answered. The patient knows to call the clinic with any problems, questions or concerns. We can certainly see the patient much sooner if necessary.  I spent 25 minutes counseling the patient face to face. The total time spent in the appointment was 30 minutes.    Drue Second,  MD Medical/Oncology [facility] [phone] (beeper) [phone] (Office)  06/29/2012, 12:49 PM

## 2012-06-29 NOTE — Patient Instructions (Addendum)
Proceed with rituxan on 8/30  MRI of lumbar spine  I will see you back in 3 weeks for follow up  Please come with your children at the next appointment

## 2012-07-01 ENCOUNTER — Telehealth: Payer: Self-pay | Admitting: *Deleted

## 2012-07-01 NOTE — Telephone Encounter (Signed)
Gave patient appointment for 07-03-2012 mri of the spine arrival time 8:45am

## 2012-07-02 ENCOUNTER — Other Ambulatory Visit: Payer: Medicare Other | Admitting: Lab

## 2012-07-02 ENCOUNTER — Ambulatory Visit (HOSPITAL_BASED_OUTPATIENT_CLINIC_OR_DEPARTMENT_OTHER): Payer: Medicare Other

## 2012-07-02 ENCOUNTER — Ambulatory Visit: Payer: Medicare Other | Admitting: Oncology

## 2012-07-02 VITALS — BP 129/59 | HR 67 | Temp 97.8°F

## 2012-07-02 DIAGNOSIS — C8589 Other specified types of non-Hodgkin lymphoma, extranodal and solid organ sites: Secondary | ICD-10-CM

## 2012-07-02 DIAGNOSIS — C8590 Non-Hodgkin lymphoma, unspecified, unspecified site: Secondary | ICD-10-CM

## 2012-07-02 DIAGNOSIS — Z5112 Encounter for antineoplastic immunotherapy: Secondary | ICD-10-CM

## 2012-07-02 MED ORDER — HEPARIN SOD (PORK) LOCK FLUSH 100 UNIT/ML IV SOLN
500.0000 [IU] | Freq: Once | INTRAVENOUS | Status: AC | PRN
  Administered 2012-07-02: 500 [IU]
  Filled 2012-07-02: qty 5

## 2012-07-02 MED ORDER — ACETAMINOPHEN 325 MG PO TABS
650.0000 mg | ORAL_TABLET | Freq: Once | ORAL | Status: AC
  Administered 2012-07-02: 650 mg via ORAL

## 2012-07-02 MED ORDER — SODIUM CHLORIDE 0.9 % IV SOLN: Freq: Once | INTRAVENOUS | Status: DC

## 2012-07-02 MED ORDER — RITUXIMAB CHEMO INJECTION 10 MG/ML
375.0000 mg/m2 | Freq: Once | INTRAVENOUS | Status: AC
  Administered 2012-07-02: 600 mg via INTRAVENOUS
  Filled 2012-07-02: qty 60

## 2012-07-02 MED ORDER — SODIUM CHLORIDE 0.9 % IJ SOLN
10.0000 mL | INTRAMUSCULAR | Status: DC | PRN
  Administered 2012-07-02: 10 mL
  Filled 2012-07-02: qty 10

## 2012-07-02 MED ORDER — DIPHENHYDRAMINE HCL 25 MG PO CAPS
50.0000 mg | ORAL_CAPSULE | Freq: Once | ORAL | Status: AC
  Administered 2012-07-02: 50 mg via ORAL

## 2012-07-02 NOTE — Patient Instructions (Signed)
Helen [name] [birth year] [id number]  [facility] Discharge Instructions for Patients Receiving Chemotherapy  Today you received the following chemotherapy agents: Rituxan   To help prevent nausea and vomiting after your treatment, we encourage you to take your nausea medication as prescribed; if you develop nausea and vomiting that is not controlled by your nausea medication, call the clinic.   BELOW ARE SYMPTOMS THAT SHOULD BE REPORTED IMMEDIATELY:  *FEVER GREATER THAN 100.5 F *CHILLS WITH OR WITHOUT FEVER *UNUSUAL SHORTNESS OF BREATH *UNUSUAL BRUISING OR BLEEDING *URINARY PROBLEMS *BOWEL PROBLEMS  I have been informed and understand all the instructions given to me.   Please call the [facility] at [phone] during business hours should you have any further questions or need assistance in obtaining follow-up care. If you have a medical emergency, please dial 911.

## 2012-07-03 ENCOUNTER — Ambulatory Visit (HOSPITAL_COMMUNITY)
Admission: RE | Admit: 2012-07-03 | Discharge: 2012-07-03 | Disposition: A | Discharge status: Home / Self Care | Payer: Medicare Other | Source: Ambulatory Visit | Attending: Oncology | Admitting: Oncology

## 2012-07-03 DIAGNOSIS — M949 Disorder of cartilage, unspecified: Secondary | ICD-10-CM | POA: Insufficient documentation

## 2012-07-03 DIAGNOSIS — C50919 Malignant neoplasm of unspecified site of unspecified female breast: Secondary | ICD-10-CM | POA: Insufficient documentation

## 2012-07-03 DIAGNOSIS — C7951 Secondary malignant neoplasm of bone: Secondary | ICD-10-CM | POA: Insufficient documentation

## 2012-07-03 DIAGNOSIS — M5126 Other intervertebral disc displacement, lumbar region: Secondary | ICD-10-CM | POA: Insufficient documentation

## 2012-07-03 DIAGNOSIS — C8590 Non-Hodgkin lymphoma, unspecified, unspecified site: Secondary | ICD-10-CM

## 2012-07-03 DIAGNOSIS — M899 Disorder of bone, unspecified: Secondary | ICD-10-CM | POA: Insufficient documentation

## 2012-07-03 HISTORY — DX: Secondary malignant neoplasm of bone: C79.51

## 2012-07-03 MED ORDER — GADOBENATE DIMEGLUMINE 529 MG/ML IV SOLN
12.0000 mL | Freq: Once | INTRAVENOUS | Status: AC | PRN
  Administered 2012-07-03: 12 mL via INTRAVENOUS

## 2012-07-06 ENCOUNTER — Emergency Department (HOSPITAL_COMMUNITY)
Admission: EM | Admit: 2012-07-06 | Discharge: 2012-07-06 | Disposition: A | Discharge status: Home / Self Care | Payer: Medicare Other | Attending: Emergency Medicine | Admitting: Emergency Medicine

## 2012-07-06 ENCOUNTER — Encounter (HOSPITAL_COMMUNITY): Payer: Self-pay

## 2012-07-06 DIAGNOSIS — M129 Arthropathy, unspecified: Secondary | ICD-10-CM | POA: Insufficient documentation

## 2012-07-06 DIAGNOSIS — I10 Essential (primary) hypertension: Secondary | ICD-10-CM | POA: Insufficient documentation

## 2012-07-06 DIAGNOSIS — IMO0002 Reserved for concepts with insufficient information to code with codable children: Secondary | ICD-10-CM | POA: Insufficient documentation

## 2012-07-06 DIAGNOSIS — M5416 Radiculopathy, lumbar region: Secondary | ICD-10-CM

## 2012-07-06 DIAGNOSIS — Z856 Personal history of leukemia: Secondary | ICD-10-CM | POA: Insufficient documentation

## 2012-07-06 DIAGNOSIS — Z853 Personal history of malignant neoplasm of breast: Secondary | ICD-10-CM | POA: Insufficient documentation

## 2012-07-06 LAB — BASIC METABOLIC PANEL
Chloride: 102 mEq/L (ref 96–112)
GFR calc Af Amer: >90 mL/min (ref >90)
Potassium: 3.8 mEq/L (ref 3.5–5.1)
Sodium: 136 mEq/L (ref 135–145)

## 2012-07-06 LAB — CBC
HCT: 30.5 % — ABNORMAL LOW (ref 36.0–46.0)
Hemoglobin: 9.7 g/dL — ABNORMAL LOW (ref 12.0–15.0)
WBC: 12.1 10*3/uL — ABNORMAL HIGH (ref 4.0–10.5)

## 2012-07-06 MED ORDER — OXYCODONE-ACETAMINOPHEN 5-325 MG PO TABS
1.0000 | ORAL_TABLET | Freq: Once | ORAL | Status: AC
  Administered 2012-07-06: 1 via ORAL
  Filled 2012-07-06: qty 1

## 2012-07-06 MED ORDER — OXYCODONE HCL 5 MG PO CAPS: 5.0000 mg | ORAL_CAPSULE | ORAL | Status: AC | PRN

## 2012-07-06 MED ORDER — DEXAMETHASONE 4 MG PO TABS: 4.0000 mg | ORAL_TABLET | Freq: Four times a day (QID) | ORAL | Status: AC

## 2012-07-06 MED ORDER — DEXAMETHASONE 6 MG PO TABS
20.0000 mg | ORAL_TABLET | Freq: Once | ORAL | Status: AC
  Administered 2012-07-06: 20 mg via ORAL
  Filled 2012-07-06 (×2): qty 1

## 2012-07-06 NOTE — ED Notes (Signed)
MD at bedside. 

## 2012-07-06 NOTE — ED Notes (Signed)
Pt presents with no acute distress- Pt denies injury- normally ambulatory without assisted device.  Pt reports severe rt lower back and hip pain that radiates to the RLE. Denies numbness andtingling

## 2012-07-06 NOTE — ED Notes (Signed)
Pt is being treated for CA at [facility].  Pt has a port to [facility]

## 2012-07-06 NOTE — ED Provider Notes (Signed)
History     CSN: [id number]  Arrival date & time 07/06/12  1005   First MD Initiated Contact with Patient 07/06/12 1025      Chief Complaint  Patient presents with  . Hip Pain  . Leg Pain    (Consider location/radiation/quality/duration/timing/severity/associated sxs/prior treatment) HPI Complaint of low back pain radiating to her right leg and foot onset 2 AM today pain is worse with movement improved with lying supine. Treated herself with Celebrex with partial relief. No fever no incontinence no other complaint. Patient had MRI scan of the lumbar spine 06/29/2012 for same complaint Past Medical History  Diagnosis Date  . Arthritis   . Blood transfusion 2009  . Breast CA 08/2008    (LT) breast ca dx 10/09/Chemo  . Lymphoma 09/09/2011  . Leukemia, acute, in remission 2012    Pt. not sure of type  . Hypertension   . Seizures 1980's    from medication reaction/Pt.    Past Surgical History  Procedure Date  . Mastectomy   . Thyroidectomy   . Appendectomy   . Tonsillectomy     Family History  Problem Relation Age of Onset  . Alcohol abuse Other   . Drug abuse Other   . Breast cancer Mother   . Prostate cancer Father   . Cancer Father   . Colon cancer Neg Hx   . Esophageal cancer Neg Hx   . Rectal cancer Neg Hx   . Stomach cancer Neg Hx     History  Substance Use Topics  . Smoking status: Never Smoker   . Smokeless tobacco: Never Used  . Alcohol Use: No    OB History    Grav Para Term Preterm Abortions TAB SAB Ect Mult Living                  Review of Systems  Constitutional: Negative.   HENT: Negative.   Respiratory: Negative.   Cardiovascular: Negative.   Gastrointestinal: Negative.   Musculoskeletal: Positive for back pain.  Skin: Negative.   Neurological: Negative.   Hematological: Negative.   Psychiatric/Behavioral: Negative.     Allergies  Codeine; Penicillins; Sulfonamide derivatives; and Tramadol hcl  Home Medications   Current  Outpatient Rx  Name Route Sig Dispense Refill  . CELECOXIB 200 MG PO CAPS Oral Take 200 mg by mouth daily as needed. For arthritis    . LETROZOLE 2.5 MG PO TABS Oral Take 1 tablet (2.5 mg total) by mouth daily. 30 tablet 12    BP 122/68  Pulse 88  Temp 98.7 F (37.1 C) (Oral)  Resp 16  SpO2 97%  Physical Exam  Nursing note and vitals reviewed. Constitutional: She appears well-developed and well-nourished.  HENT:  Head: Normocephalic and atraumatic.  Eyes: Conjunctivae are normal. Pupils are equal, round, and reactive to light.  Neck: Neck supple. No tracheal deviation present. No thyromegaly present.  Cardiovascular: Normal rate and regular rhythm.   No murmur heard. Pulmonary/Chest: Effort normal and breath sounds normal.  Abdominal: Soft. Bowel sounds are normal. She exhibits no distension. There is no tenderness.  Musculoskeletal: Normal range of motion. She exhibits tenderness. She exhibits no edema.        Minimal tenderness the lumbar spine. Patient has pain of low back upon moving left lower extremity  Neurological: She is alert. Coordination normal.  Skin: Skin is warm and dry. No rash noted.  Psychiatric: She has a normal mood and affect.    ED Course  Procedures (  including critical care time)  Labs Reviewed - No data to display No results found. Results for orders placed during the hospital encounter of 07/06/12  BASIC METABOLIC PANEL      Component Value Range   Sodium 136  135 - 145 mEq/L   Potassium 3.8  3.5 - 5.1 mEq/L   Chloride 102  96 - 112 mEq/L   CO2 20  19 - 32 mEq/L   Glucose, Bld 182 (*) 70 - 99 mg/dL   BUN 8  6 - 23 mg/dL   Creatinine, Ser 1.61  0.50 - 1.10 mg/dL   Calcium 9.1  8.4 - 09.6 mg/dL   GFR calc non Af Amer 87 (*) >90 mL/min   GFR calc Af Amer >90  >90 mL/min  CBC      Component Value Range   WBC 12.1 (*) 4.0 - 10.5 K/uL   RBC 3.68 (*) 3.87 - 5.11 MIL/uL   Hemoglobin 9.7 (*) 12.0 - 15.0 g/dL   HCT 04.5 (*) 40.9 - 81.1 %   MCV  82.9  78.0 - 100.0 fL   MCH 26.4  26.0 - 34.0 pg   MCHC 31.8  30.0 - 36.0 g/dL   RDW 91.4  78.2 - 95.6 %   Platelets 315  150 - 400 K/uL   Mr Lumbar Spine W Wo Contrast  07/03/2012  *RADIOLOGY REPORT*  Clinical Data: Breast cancer with osseous metastasis to L4.  MRI LUMBAR SPINE WITHOUT AND WITH CONTRAST  Technique:  Multiplanar and multiecho pulse sequences of the lumbar spine were obtained without and with intravenous contrast.  Contrast: 12mL MULTIHANCE GADOBENATE DIMEGLUMINE 529 MG/ML IV SOLN  Comparison: PET scan 06/24/2012.  Findings: A 2.4 x 2.0 cm enhancing mass lesion in the posterior body of L4 is associated with pathologic fracture and slight loss of vertebral body height.  There is minimal retropulsion of bone without significant stenosis.  A second metastasis involves the spinous process of T12.  No other focal osseous enhancement is present.  Normal signals present in the conus medullaris which terminates at T12-L1, within normal limits.  The disc levels at L2-3 and above are normal.  L3-4:  Minimal disc bulging is asymmetric to the left.  There is no significant stenosis.  L4-5:  Slight anterolisthesis is associated with a broad-based disc herniation.  Moderate facet hypertrophy is present.  This results in mild to moderate central canal stenosis.  The foramina are patent bilaterally.  L5-S1:  A broad-based disc herniation is present.  Mild lateral recess and foraminal stenosis is evident bilaterally.  IMPRESSION:  1.  2.4 cm enhancing mass lesion in the posterior body of L4 is compatible with a metastasis.  There is slight loss of vertebral body height compatible with a pathologic fracture.  Minimal retropulsion of bone is present without significant stenosis. 2.  Slight anterolisthesis and a broad-based disc herniation at L4- 5 results in moderate central canal stenosis. 3.  Mild lateral recess and foraminal narrowing bilaterally at L5- S1.   Original Report Authenticated By: Jamesetta Orleans.  MATTERN, M.D.    Nm Pet Image Restag (ps) Skull Base To Thigh  06/24/2012  *RADIOLOGY REPORT*  Clinical Data: Subsequent treatment strategy for left-sided breast cancer. Also with history of low grade non-Hodgkins lymphoma.  NUCLEAR MEDICINE PET CT SKULL BASE TO THIGH  Technique:  Technique:  18.8 mCi F-18 FDG was injected intravenously. CT data was obtained and used for attenuation correction and anatomic localization only.  (This was not acquired  as a diagnostic CT examination.) Additional exam technical data entered on technologist worksheet.  Comparison: PET of 02/07/2011.  CTs of 06/01/2012.  Findings: Neck: Hypermetabolism within the left side of the mandible, with suspicion of concurrent periapical focal lucency on image 24.  Bilateral cervical hypermetabolic lymph nodes.  Hypermetabolism which is primarily felt to be nodal surrounds the right jugular vein and extends into the thoracic inlet.  This measures a S.U.V. max of 9.9 on image 57  Chest:  Hypermetabolic right axillary node.  This measures 1.7 cm and a S.U.V. max of 6.1 on image 82.  On the prior PET, this node measured 1.7 cm and a S.U.V. max of 3.1.  Small prevascular nodes which are hypermetabolic, including on image 72.  Abdomen/Pelvis:  Right adrenal hypermetabolism which is without CT correlate.  Hypermetabolic left external iliac adenopathy.  This measures 1.3 cm and a S.U.V. max of 5.0 on image 181.  On the prior PET, this node measured 1.6 cm and on the order of a S.U.V. max of 2.6.  Skelton:  Multifocal marrow hypermetabolism consistent with osseous involvement.  Index lesion in the right humeral head measures a S.U.V. max of 14.4 on image 53 and is new.  A hypermetabolic lytic lesion within the left iliac wing is new or progressive and measures a S.U.V. max of 8.1 on image 155.  CT images performed for attenuation correction demonstrate increased number and size of lymph nodes within the neck.  Index right jugulodigastric node measures 9  mm short axis on image 34 versus 6 mm on the prior PET.  Chest, abdomen, and pelvic findings deferred to recent diagnostic CTs.  Trace right-sided pleural fluid is new.  Left upper lobe radiation fibrosis.  Punctate left renal calculi.  Cholelithiasis.  Fibroid uterus.  L4 osseous metastasis has possible epidural component on image 144 transverse.  IMPRESSION:  1.  Increasing hypermetabolism associated with adenopathy within the neck, chest, and pelvis, as detailed above.  Findings are most likely indicative of progression of lymphoma. 2.  Progressive osseous metastasis, as detailed above. An L4 lesion has possible epidural component.  Consider pre and post contrast lumbar spine MRI. 3.  Hypermetabolism at the right lower neck and thoracic inlet surrounds the right internal jugular vein.  This appears somewhat hyperattenuating.  Cannot exclude thrombus.  Consider right upper extremity venous ultrasound with attention to this area. 4.  Likely dental inflammatory left mandibular hypermetabolism. Consider physical exam correlation.   Original Report Authenticated By: Consuello Bossier, M.D.    Result from lumbar MRI reviewed No diagnosis found. 1:10 PM patient much improved and pain improved, resting comfortably after treatment with Percocet and Decadron. Patient is able to ambulate without difficulty MDM  Spoke with Dr  Park Breed, the patient's oncologist who is aware of MRI scan. Plan Decadron 20 mg by mouth in the emergency department, prescription Decadron 4 mg every 6 hours prescription OXY IR, the patient will be followup by interventional radiology and is to expect a call to arrange followup Dx lumbar radiculopathy       Doug Sou, MD 07/06/12 1318

## 2012-07-08 ENCOUNTER — Ambulatory Visit (HOSPITAL_BASED_OUTPATIENT_CLINIC_OR_DEPARTMENT_OTHER): Payer: Medicare Other | Admitting: Oncology

## 2012-07-08 ENCOUNTER — Ambulatory Visit (HOSPITAL_BASED_OUTPATIENT_CLINIC_OR_DEPARTMENT_OTHER): Payer: Medicare Other

## 2012-07-08 ENCOUNTER — Other Ambulatory Visit (HOSPITAL_BASED_OUTPATIENT_CLINIC_OR_DEPARTMENT_OTHER): Payer: Medicare Other

## 2012-07-08 ENCOUNTER — Other Ambulatory Visit: Payer: Medicare Other | Admitting: Lab

## 2012-07-08 ENCOUNTER — Telehealth: Payer: Self-pay | Admitting: *Deleted

## 2012-07-08 ENCOUNTER — Encounter: Payer: Self-pay | Admitting: Oncology

## 2012-07-08 VITALS — BP 127/68 | HR 80 | Temp 98.3°F | Resp 20 | Ht 62.0 in | Wt 139.6 lb

## 2012-07-08 VITALS — BP 122/68 | HR 69 | Temp 98.1°F | Resp 16

## 2012-07-08 DIAGNOSIS — C50919 Malignant neoplasm of unspecified site of unspecified female breast: Secondary | ICD-10-CM

## 2012-07-08 DIAGNOSIS — C8588 Other specified types of non-Hodgkin lymphoma, lymph nodes of multiple sites: Secondary | ICD-10-CM

## 2012-07-08 DIAGNOSIS — C8590 Non-Hodgkin lymphoma, unspecified, unspecified site: Secondary | ICD-10-CM

## 2012-07-08 DIAGNOSIS — C7951 Secondary malignant neoplasm of bone: Secondary | ICD-10-CM

## 2012-07-08 DIAGNOSIS — Z853 Personal history of malignant neoplasm of breast: Secondary | ICD-10-CM

## 2012-07-08 DIAGNOSIS — C7952 Secondary malignant neoplasm of bone marrow: Secondary | ICD-10-CM

## 2012-07-08 DIAGNOSIS — Z5112 Encounter for antineoplastic immunotherapy: Secondary | ICD-10-CM

## 2012-07-08 DIAGNOSIS — C8300 Small cell B-cell lymphoma, unspecified site: Secondary | ICD-10-CM

## 2012-07-08 LAB — CBC WITH DIFFERENTIAL/PLATELET
Basophils Absolute: 0 10*3/uL (ref 0.0–0.1)
Eosinophils Absolute: 0 10*3/uL (ref 0.0–0.5)
HGB: 10.2 g/dL — ABNORMAL LOW (ref 11.6–15.9)
LYMPH%: 1.8 % — ABNORMAL LOW (ref 14.0–49.7)
MCV: 81.3 fL (ref 79.5–101.0)
MONO#: 1.1 10*3/uL — ABNORMAL HIGH (ref 0.1–0.9)
MONO%: 8 % (ref 0.0–14.0)
NEUT#: 12.6 10*3/uL — ABNORMAL HIGH (ref 1.5–6.5)
Platelets: 331 10*3/uL (ref 145–400)
RDW: 14.5 % (ref 11.2–14.5)
WBC: 13.9 10*3/uL — ABNORMAL HIGH (ref 3.9–10.3)

## 2012-07-08 LAB — BASIC METABOLIC PANEL (CC13)
CO2: 22 mEq/L (ref 22–29)
Calcium: 9.4 mg/dL (ref 8.4–10.4)
Chloride: 106 mEq/L (ref 98–107)
Glucose: 148 mg/dl — ABNORMAL HIGH (ref 70–99)
Sodium: 140 mEq/L (ref 136–145)

## 2012-07-08 MED ORDER — SODIUM CHLORIDE 0.9 % IV SOLN
Freq: Once | INTRAVENOUS | Status: AC
  Administered 2012-07-08: 12:00:00 via INTRAVENOUS

## 2012-07-08 MED ORDER — ACETAMINOPHEN 325 MG PO TABS
650.0000 mg | ORAL_TABLET | Freq: Once | ORAL | Status: AC
  Administered 2012-07-08: 650 mg via ORAL

## 2012-07-08 MED ORDER — DIPHENHYDRAMINE HCL 25 MG PO CAPS
50.0000 mg | ORAL_CAPSULE | Freq: Once | ORAL | Status: AC
  Administered 2012-07-08: 50 mg via ORAL

## 2012-07-08 MED ORDER — SODIUM CHLORIDE 0.9 % IV SOLN
375.0000 mg/m2 | Freq: Once | INTRAVENOUS | Status: AC
  Administered 2012-07-08: 600 mg via INTRAVENOUS
  Filled 2012-07-08: qty 60

## 2012-07-08 NOTE — Patient Instructions (Addendum)
You have a radiation oncology appointment on 07/09/12 at 8:00 am  I will see you back in 2 weeks

## 2012-07-08 NOTE — Telephone Encounter (Signed)
see kk in 2 weeks  gave patient appointment for 07-21-2012 starting at 9:30am

## 2012-07-08 NOTE — Patient Instructions (Signed)
[facility] Discharge Instructions for Patients Receiving Chemotherapy  Today you received the following chemotherapy agents Rituxan   To help prevent nausea and vomiting after your treatment, we encourage you to take your nausea medication as prescribed If you develop nausea and vomiting that is not controlled by your nausea medication, call the clinic. If it is after clinic hours your family physician or the after hours number for the clinic or go to the Emergency Department.   BELOW ARE SYMPTOMS THAT SHOULD BE REPORTED IMMEDIATELY:  *FEVER GREATER THAN 100.5 F  *CHILLS WITH OR WITHOUT FEVER  NAUSEA AND VOMITING THAT IS NOT CONTROLLED WITH YOUR NAUSEA MEDICATION  *UNUSUAL SHORTNESS OF BREATH  *UNUSUAL BRUISING OR BLEEDING  TENDERNESS IN MOUTH AND THROAT WITH OR WITHOUT PRESENCE OF ULCERS  *URINARY PROBLEMS  *BOWEL PROBLEMS  UNUSUAL RASH Items with * indicate a potential emergency and should be followed up as soon as possible.  One of the nurses will contact you 24 hours after your treatment. Please let the nurse know about any problems that you may have experienced. Feel free to call the clinic you have any questions or concerns. The clinic phone number is [phone].   I have been informed and understand all the instructions given to me. I know to contact the clinic, my physician, or go to the Emergency Department if any problems should occur. I do not have any questions at this time, but understand that I may call the clinic during office hours   should I have any questions or need assistance in obtaining follow up care.    __________________________________________  _____________  __________ Signature of Patient or Authorized Representative            Date                   Time    __________________________________________ Nurse's Signature    

## 2012-07-09 ENCOUNTER — Encounter: Payer: Self-pay | Admitting: *Deleted

## 2012-07-09 ENCOUNTER — Ambulatory Visit
Admission: RE | Admit: 2012-07-09 | Discharge: 2012-07-09 | Disposition: A | Discharge status: Home / Self Care | Payer: Medicare Other | Source: Ambulatory Visit | Attending: Radiation Oncology | Admitting: Radiation Oncology

## 2012-07-09 ENCOUNTER — Encounter: Payer: Self-pay | Admitting: Radiation Oncology

## 2012-07-09 ENCOUNTER — Telehealth: Payer: Self-pay | Admitting: *Deleted

## 2012-07-09 ENCOUNTER — Ambulatory Visit: Admission: RE | Admit: 2012-07-09 | Payer: Medicare Other | Source: Ambulatory Visit

## 2012-07-09 VITALS — BP 118/76 | HR 67 | Temp 97.9°F | Resp 20 | Wt 137.4 lb

## 2012-07-09 DIAGNOSIS — C7951 Secondary malignant neoplasm of bone: Secondary | ICD-10-CM

## 2012-07-09 DIAGNOSIS — C50919 Malignant neoplasm of unspecified site of unspecified female breast: Secondary | ICD-10-CM | POA: Insufficient documentation

## 2012-07-09 DIAGNOSIS — Z923 Personal history of irradiation: Secondary | ICD-10-CM | POA: Insufficient documentation

## 2012-07-09 DIAGNOSIS — C8589 Other specified types of non-Hodgkin lymphoma, extranodal and solid organ sites: Secondary | ICD-10-CM | POA: Insufficient documentation

## 2012-07-09 DIAGNOSIS — Z510 Encounter for antineoplastic radiation therapy: Secondary | ICD-10-CM | POA: Insufficient documentation

## 2012-07-09 HISTORY — DX: Personal history of irradiation: Z92.3

## 2012-07-09 NOTE — Progress Notes (Signed)
Please see the Nurse Progress Note in the MD Initial Consult Encounter for this patient. 

## 2012-07-09 NOTE — Progress Notes (Addendum)
Pt taking Oxycodone 5 mg q4hr to control right-sided low back pain which radiates to right hip, down leg to her foot. She gets 100% relief w/pain med. Pt is also on Dexamethasone 4 mg qid. Pt denies loss of appetite, does report fatigue, difficulty moving.  Pt scored 9/10 on distress scale; left vm for L Mullis, SW. Pt requests to be seen today if possible.

## 2012-07-09 NOTE — Telephone Encounter (Signed)
Helen [name] returned Md's call. LMOVM. He was sorry he missed the call as he works nights from 9pm getting home about 10am.  He is available at cell [phone] or home#[phone] from 10am-1pm then rests until approx 830pm . [name] was apologetic the call from MD was missed.

## 2012-07-09 NOTE — Progress Notes (Signed)
Radiation Oncology         [phone] ________________________________  Name: Helen [name] MRN: [id number]  Date: 07/09/2012  DOB: [birth year]  Follow-Up Visit Note  CC: [name], MD  Victorino December, MD  Diagnosis:   Metastatic/progressive lymphoma with a history of breast cancer  Interval Since Last Radiation:  4 years   Narrative:  The patient returns today for routine follow-up.  She has a history of left breast cancer for which she was seen and treated. She presented with a fungating mass and receive neoadjuvant chemoradiotherapy followed by mastectomy. She has been on letrozole. Subsequent to this she developed a low-grade non-Hodgkin's lymphoma. She underwent chemotherapy and has been on maintenance Rituxan.  The patient underwent a recent PET scan which showed increasing hypermetabolism associated with adenopathy within the neck, chest, and pelvis. There is also progressive osseous metastasis. An L4 lesion had likely epidural component. Hypermetabolism was also seen in particular within the right proximal humerus. A subsequent MRI scan of the lumbar spine was performed and this showed a 2.4 cm enhancing mass within the posterior body of the L4 compatible with a metastasis.  The patient is complaining of some low back pain at this time with radiation to the right lower extremity. No significant pain on the left. On questioning she does describe some pain in the right shoulder as well which she attributed to arthritis.                              ALLERGIES:  is allergic to codeine; penicillins; sulfonamide derivatives; tape; and tramadol hcl.  Meds: Current Outpatient Prescriptions  Medication Sig Dispense Refill  . celecoxib (CELEBREX) 200 MG capsule Take 200 mg by mouth daily as needed. For arthritis      . dexamethasone (DECADRON) 4 MG tablet Take 1 tablet (4 mg total) by mouth 4 (four) times daily.  120 tablet  0  . letrozole (FEMARA) 2.5 MG tablet Take 1 tablet (2.5 mg  total) by mouth daily.  30 tablet  12  . oxycodone (OXYIR) 5 MG capsule Take 1 capsule (5 mg total) by mouth every 4 (four) hours as needed.  60 capsule  0   Current Facility-Administered Medications  Medication Dose Route Frequency Provider Last Rate Last Dose  . 0.9 %  sodium chloride infusion  500 mL Intravenous Continuous Louis Meckel, MD       Facility-Administered Medications Ordered in Other Encounters  Medication Dose Route Frequency Provider Last Rate Last Dose  . 0.9 %  sodium chloride infusion   Intravenous Once Victorino December, MD      . acetaminophen (TYLENOL) tablet 650 mg  650 mg Oral Once Victorino December, MD   650 mg at 07/08/12 1144  . diphenhydrAMINE (BENADRYL) capsule 50 mg  50 mg Oral Once Victorino December, MD   50 mg at 07/08/12 1144  . riTUXimab (RITUXAN) 600 mg in sodium chloride 0.9 % 250 mL chemo infusion  375 mg/m2 (Treatment Plan Actual) Intravenous Once Victorino December, MD   600 mg at 07/08/12 1355    Physical Findings: The patient is in no acute distress. Patient is alert and oriented.  weight is 137 lb 6.4 oz (62.324 kg). Her oral temperature is 97.9 F (36.6 C). Her blood pressure is 118/76 and her pulse is 67. Her respiration is 20. [name]   General: Well-developed, in no acute distress HEENT: Normocephalic, atraumatic Cardiovascular:  Regular rate and rhythm Respiratory: Clear to auscultation bilaterally GI: Soft, nontender, normal bowel sounds Extremities: No edema present; by nontender to palpation as was the right shoulder  Lab Findings: Lab Results  Component Value Date   WBC 13.9* 07/08/2012   HGB 10.2* 07/08/2012   HCT 32.2* 07/08/2012   MCV 81.3 07/08/2012   PLT 331 07/08/2012     Radiographic Findings: Mr Lumbar Spine W Wo Contrast  07/03/2012  *RADIOLOGY REPORT*  Clinical Data: Breast cancer with osseous metastasis to L4.  MRI LUMBAR SPINE WITHOUT AND WITH CONTRAST  Technique:  Multiplanar and multiecho pulse sequences of the lumbar spine were obtained  without and with intravenous contrast.  Contrast: 12mL MULTIHANCE GADOBENATE DIMEGLUMINE 529 MG/ML IV SOLN  Comparison: PET scan 06/24/2012.  Findings: A 2.4 x 2.0 cm enhancing mass lesion in the posterior body of L4 is associated with pathologic fracture and slight loss of vertebral body height.  There is minimal retropulsion of bone without significant stenosis.  A second metastasis involves the spinous process of T12.  No other focal osseous enhancement is present.  Normal signals present in the conus medullaris which terminates at T12-L1, within normal limits.  The disc levels at L2-3 and above are normal.  L3-4:  Minimal disc bulging is asymmetric to the left.  There is no significant stenosis.  L4-5:  Slight anterolisthesis is associated with a broad-based disc herniation.  Moderate facet hypertrophy is present.  This results in mild to moderate central canal stenosis.  The foramina are patent bilaterally.  L5-S1:  A broad-based disc herniation is present.  Mild lateral recess and foraminal stenosis is evident bilaterally.  IMPRESSION:  1.  2.4 cm enhancing mass lesion in the posterior body of L4 is compatible with a metastasis.  There is slight loss of vertebral body height compatible with a pathologic fracture.  Minimal retropulsion of bone is present without significant stenosis. 2.  Slight anterolisthesis and a broad-based disc herniation at L4- 5 results in moderate central canal stenosis. 3.  Mild lateral recess and foraminal narrowing bilaterally at L5- S1.   Original Report Authenticated By: Jamesetta Orleans. MATTERN, M.D.    Nm Pet Image Restag (ps) Skull Base To Thigh  06/24/2012  *RADIOLOGY REPORT*  Clinical Data: Subsequent treatment strategy for left-sided breast cancer. Also with history of low grade non-Hodgkins lymphoma.  NUCLEAR MEDICINE PET CT SKULL BASE TO THIGH  Technique:  Technique:  18.8 mCi F-18 FDG was injected intravenously. CT data was obtained and used for attenuation correction and  anatomic localization only.  (This was not acquired as a diagnostic CT examination.) Additional exam technical data entered on technologist worksheet.  Comparison: PET of 02/07/2011.  CTs of 06/01/2012.  Findings: Neck: Hypermetabolism within the left side of the mandible, with suspicion of concurrent periapical focal lucency on image 24.  Bilateral cervical hypermetabolic lymph nodes.  Hypermetabolism which is primarily felt to be nodal surrounds the right jugular vein and extends into the thoracic inlet.  This measures a S.U.V. max of 9.9 on image 57  Chest:  Hypermetabolic right axillary node.  This measures 1.7 cm and a S.U.V. max of 6.1 on image 82.  On the prior PET, this node measured 1.7 cm and a S.U.V. max of 3.1.  Small prevascular nodes which are hypermetabolic, including on image 72.  Abdomen/Pelvis:  Right adrenal hypermetabolism which is without CT correlate.  Hypermetabolic left external iliac adenopathy.  This measures 1.3 cm and a S.U.V. max of 5.0 on  image 181.  On the prior PET, this node measured 1.6 cm and on the order of a S.U.V. max of 2.6.  Skel[place]:  Multifocal marrow hypermetabolism consistent with osseous involvement.  Index lesion in the right humeral head measures a S.U.V. max of 14.4 on image 53 and is new.  A hypermetabolic lytic lesion within the left iliac wing is new or progressive and measures a S.U.V. max of 8.1 on image 155.  CT images performed for attenuation correction demonstrate increased number and size of lymph nodes within the neck.  Index right jugulodigastric node measures 9 mm short axis on image 34 versus 6 mm on the prior PET.  Chest, abdomen, and pelvic findings deferred to recent diagnostic CTs.  Trace right-sided pleural fluid is new.  Left upper lobe radiation fibrosis.  Punctate left renal calculi.  Cholelithiasis.  Fibroid uterus.  L4 osseous metastasis has possible epidural component on image 144 transverse.  IMPRESSION:  1.  Increasing hypermetabolism  associated with adenopathy within the neck, chest, and pelvis, as detailed above.  Findings are most likely indicative of progression of lymphoma. 2.  Progressive osseous metastasis, as detailed above. An L4 lesion has possible epidural component.  Consider pre and post contrast lumbar spine MRI. 3.  Hypermetabolism at the right lower neck and thoracic inlet surrounds the right internal jugular vein.  This appears somewhat hyperattenuating.  Cannot exclude thrombus.  Consider right upper extremity venous ultrasound with attention to this area. 4.  Likely dental inflammatory left mandibular hypermetabolism. Consider physical exam correlation.   Original Report Authenticated By: Consuello Bossier, M.D.     Impression:    Pleasant 77 year old female with a history of locally advanced breast cancer as well as lymphoma. The patient's recent PET scan shows progressive disease compatible with lymphoma. She is an appropriate candidate for palliative radiation to the lumbar spine centered at the L4 level. She also is an appropriate candidate for palliative radiation to the left shoulder given the PET scan findings and her prescription of pain in this location.  Plan:  The patient will proceed with a simulation today. I discussed in detail the possible side effects and risks of treatment to both of these areas and all of her questions were answered. She does express an understanding and why we are doing this treatment and she is familiar of radiation given her prior course.  I would anticipate a 2 week course of treatment treating these 2 sites concurrently.  I spent 30 minutes with the patient today, the majority of which was spent counseling the patient on the diagnosis of cancer and coordinating care.   Radene Gunning, M.D., Ph.D.

## 2012-07-09 NOTE — Progress Notes (Signed)
[facility] Psychosocial Distress Screening Clinical Social Work  Clinical Social Work was referred by distress screening protocol.  The patient scored a 9 on the Psychosocial Distress Thermometer which indicates severe distress. Clinical Social Worker met with patient in the exam room to assess for distress and other psychosocial needs.  Pt stated she was feeling "better", but was experiencing some pain.  CSW and pt discussed distress screen, an pt's current concerns.  Pt stated she has transportation through "an agency for the elderly".  She was unable to remember the name at time of meeting.  CSW provided pt with information on [employer] and [employer].  CSW offered to make a referral to [facility], but Pt stated she would like to "think about it" and would call CSW.  CSW offered pt support and encouraged pt to call with any questions or concerns.      Tamala Julian, MSW, LCSW Clinical Social Worker [facility] [phone]

## 2012-07-09 NOTE — Addendum Note (Signed)
Encounter addended by: Glennie Hawk, RN on: 07/09/2012  3:38 PM<BR>     Documentation filed: Charges VN

## 2012-07-10 NOTE — Progress Notes (Signed)
  Radiation Oncology         [phone] ________________________________  Name: Helen [name] MRN: [id number]  Date: 07/09/2012  DOB: 05-14-1935  SIMULATION AND TREATMENT PLANNING NOTE  DIAGNOSIS:  Lymphoma, stage IV  NARRATIVE:  The patient was brought to the CT Simulation planning suite.  Identity was confirmed.  All relevant records and images related to the planned course of therapy were reviewed.   Written consent to proceed with treatment was confirmed which was freely given after reviewing the details related to the planned course of therapy had been reviewed with the patient.  Then, the patient was set-up in a stable reproducible  supine position for radiation therapy to the right shoulder. A customized accuform device was constructed for patient immobilization. This complex treatment device will be used each fraction.  CT images were obtained.  Surface markings were placed.  The patient was then positioned for treatment to a second site: the L spine. This also was a supine setup. A customized vac-loc bag was constructed for patient immobilization. This complex treatment device will be used each fraction.  CT images were obtained.  Surface markings were placed.    The CT images were loaded into the planning software.  Then the target and avoidance structures were contoured.  Treatment planning then occurred.  The radiation prescription was entered and confirmed.  A total of 4 complex treatment devices were fabricated which relate to the designed radiation treatment fields. Each of these customized fields/ complex treatment devices will be used on a daily basis during the radiation course. I have requested : Isodose Plan.   PLAN:  The patient will receive 25 Gy in 10 fractions to both target sites: right shoulder and L-spine (L3-L5).  ________________________________   Radene Gunning, MD, PhD

## 2012-07-12 NOTE — Progress Notes (Signed)
OFFICE PROGRESS NOTE  CC  [name], MD [address] [facility] [address] [place], [address] [place] [address]  DIAGNOSIS: Your-year-old female with:  #1 take left breast carcinoma originally presenting as a fungating mass. Patient underwent neoadjuvant chemotherapy followed by mastectomy. She is currently on letrozole 2.5 mg daily.  #2 low-grade non-Hodgkin lymphoma status post CVP and Rituxan now receiving maintenance Rituxan.  PRIOR THERAPY:  #1 the patient was diagnosed with metastatic breast carcinoma beginning October 2009. She returned her 1 chemotherapy followed by mastectomy radiation. She has been on letrozole 2.5 mg daily tolerating it well.  #2 Was diagnosed with low-grade non-Hodgkin lymphoma  12. She received 6 cycles of CVP and Rituxan. Last week for maintenance Rituxan. She is to receive 4 weeks in a row every 6 months for a total of 2 years.  CURRENT THERAPY: Refer to radiation oncology for palliative radiation therapy to the lumbar spine.  INTERVAL HISTORY: Helen [name] 77 y.o. female returns for Visit today. Patient was seen in the emergency room over Monday. She had significant pain in the lower back. Her MRI from the weekend revealed painful L4 metastases. I do think this is the reason why she was having pain that she told me about on recent visit. She is today in a wheelchair. She was seen here today without any family members. I did try to get in touch with her son who unfortunately works at night and I was not able to touch bases with him and I will try to do so again. Patient knows that she has an appointment with radiation oncology tomorrow. Patient otherwise denies any nausea vomiting fevers chills she has no headaches or double vision. Remainder of the review of systems is unremarkable.[name] MEDICAL HISTORY: Past Medical History  Diagnosis Date  . Arthritis   . Blood transfusion 2009  . Breast CA 08/2008    (LT) breast ca dx 10/09/Chemo  . Lymphoma 09/09/2011   NHL  . Leukemia, acute, in remission 2012    Pt. not sure of type  . Hypertension   . Seizures 1980's    from medication reaction/Pt.  [name] Hx of radiation therapy 09/11/08 -10/31/08    left breast  . Metastasis to bone 07/03/2012    MRI L spine    ALLERGIES:  is allergic to codeine; penicillins; sulfonamide derivatives; tape; and tramadol hcl.  MEDICATIONS:  Current Outpatient Prescriptions  Medication Sig Dispense Refill  . celecoxib (CELEBREX) 200 MG capsule Take 200 mg by mouth daily as needed. For arthritis      . dexamethasone (DECADRON) 4 MG tablet Take 1 tablet (4 mg total) by mouth 4 (four) times daily.  120 tablet  0  . letrozole (FEMARA) 2.5 MG tablet Take 1 tablet (2.5 mg total) by mouth daily.  30 tablet  12  . oxycodone (OXYIR) 5 MG capsule Take 1 capsule (5 mg total) by mouth every 4 (four) hours as needed.  60 capsule  0   Current Facility-Administered Medications  Medication Dose Route Frequency Provider Last Rate Last Dose  . 0.9 %  sodium chloride infusion  500 mL Intravenous Continuous Louis Meckel, MD        SURGICAL HISTORY:  Past Surgical History  Procedure Date  . Mastectomy   . Thyroidectomy 1965  . Appendectomy   . Tonsillectomy     REVIEW OF SYSTEMS:  Pertinent items are noted in HPI.   PHYSICAL EXAMINATION: General appearance: alert, cooperative and appears stated age Neck:  no adenopathy, no carotid bruit, no JVD, supple, symmetrical, trachea midline and thyroid not enlarged, symmetric, no tenderness/mass/nodules Lymph nodes: Cervical, supraclavicular, and axillary nodes normal. Resp: clear to auscultation bilaterally and normal percussion bilaterally Back: symmetric, no curvature. ROM normal. No CVA tenderness. Cardio: regular rate and rhythm, S1, S2 normal, no murmur, click, rub or gallop and normal apical impulse GI: soft, non-tender; bowel sounds normal; no masses,  no organomegaly Extremities: extremities normal, atraumatic, no cyanosis or  edema Neurologic: Alert and oriented X 3, normal strength and tone. Normal symmetric reflexes. Normal coordination and gait  ECOG PERFORMANCE STATUS: 1 - Symptomatic but completely ambulatory  Blood pressure 127/68, pulse 80, temperature 98.3 F (36.8 C), temperature source Oral, resp. rate 20, height 5\' 2"  (1.575 m), weight 139 lb 9.6 oz (63.322 kg).  LABORATORY DATA: Lab Results  Component Value Date   WBC 13.9* 07/08/2012   HGB 10.2* 07/08/2012   HCT 32.2* 07/08/2012   MCV 81.3 07/08/2012   PLT 331 07/08/2012      Chemistry      Component Value Date/Time   NA 140 07/08/2012 0951   NA 136 07/06/2012 1122   NA 142 06/01/2012 1008   K 3.6 07/08/2012 0951   K 3.8 07/06/2012 1122   K 3.8 06/01/2012 1008   CL 106 07/08/2012 0951   CL 102 07/06/2012 1122   CL 103 06/01/2012 1008   CO2 22 07/08/2012 0951   CO2 20 07/06/2012 1122   CO2 25 06/01/2012 1008   BUN 14.0 07/08/2012 0951   BUN 8 07/06/2012 1122   BUN 9 06/01/2012 1008   CREATININE 0.7 07/08/2012 0951   CREATININE 0.60 07/06/2012 1122   CREATININE 0.9 06/01/2012 1008      Component Value Date/Time   CALCIUM 9.4 07/08/2012 0951   CALCIUM 9.1 07/06/2012 1122   CALCIUM 9.8 06/01/2012 1008   ALKPHOS 131 06/29/2012 1144   ALKPHOS 69 02/10/2012 1035   ALKPHOS 78 08/14/2010 1251   AST 26 06/29/2012 1144   AST 20 02/10/2012 1035   AST 19 08/14/2010 1251   ALT 68* 06/29/2012 1144   ALT 19 02/10/2012 1035   BILITOT 0.30 06/29/2012 1144   BILITOT 0.4 02/10/2012 1035   BILITOT 0.40 08/14/2010 1251       RADIOGRAPHIC STUDIES: NUCLEAR MEDICINE PET CT SKULL BASE TO THIGH  Technique: Technique: 18.8 mCi F-18 FDG was injected  intravenously. CT data was obtained and used for attenuation  correction and anatomic localization only. (This was not acquired  as a diagnostic CT examination.) Additional exam technical data  entered on technologist worksheet.  Comparison: PET of 02/07/2011. CTs of 06/01/2012.  Findings: Neck: Hypermetabolism within the left side of the    mandible, with suspicion of concurrent periapical focal lucency on  image 24.  Bilateral cervical hypermetabolic lymph nodes. Hypermetabolism  which is primarily felt to be nodal surrounds the right jugular  vein and extends into the thoracic inlet. This measures a S.U.V.  max of 9.9 on image 57  Chest: Hypermetabolic right axillary node. This measures 1.7 cm  and a S.U.V. max of 6.1 on image 82. On the prior PET, this node  measured 1.7 cm and a S.U.V. max of 3.1.  Small prevascular nodes which are hypermetabolic, including on  image 72.  Abdomen/Pelvis: Right adrenal hypermetabolism which is without CT  correlate. Hypermetabolic left external iliac adenopathy. This  measures 1.3 cm and a S.U.V. max of 5.0 on image 181. On the prior  PET,  this node measured 1.6 cm and on the order of a S.U.V. max of  2.6.  Skelton: Multifocal marrow hypermetabolism consistent with osseous  involvement. Index lesion in the right humeral head measures a  S.U.V. max of 14.4 on image 53 and is new.  A hypermetabolic lytic lesion within the left iliac wing is new or  progressive and measures a S.U.V. max of 8.1 on image 155.  CT images performed for attenuation correction demonstrate  increased number and size of lymph nodes within the neck. Index  right jugulodigastric node measures 9 mm short axis on image 34  versus 6 mm on the prior PET. Chest, abdomen, and pelvic findings  deferred to recent diagnostic CTs. Trace right-sided pleural fluid  is new. Left upper lobe radiation fibrosis. Punctate left renal  calculi. Cholelithiasis. Fibroid uterus. L4 osseous metastasis  has possible epidural component on image 144 transverse.  IMPRESSION:  1. Increasing hypermetabolism associated with adenopathy within  the neck, chest, and pelvis, as detailed above. Findings are most  likely indicative of progression of lymphoma.  2. Progressive osseous metastasis, as detailed above. An L4 lesion  has possible  epidural component. Consider pre and post contrast  lumbar spine MRI.  3. Hypermetabolism at the right lower neck and thoracic inlet  surrounds the right internal jugular vein. This appears somewhat  hyperattenuating. Cannot exclude thrombus. Consider right upper  extremity venous ultrasound with attention to this area.  4. Likely dental inflammatory left mandibular hypermetabolism.  Consider physical exam correlation.   ASSESSMENT: 77 year old female with  #1Patient with history of low-grade non-Hodgkin lymphoma she initially received CVP Rituxan and then subsequently on maintenance Rituxan.   #2 metastatic breast carcinoma originally diagnosed in 2009 now maintained on letrozole 2.5 mg daily.  #3 patient with recurrent low-grade lymphoma she will begin Rituxan and creatinine.in one month's time.  #4 L4 bone metastasis which is painful. Patient will be referred to radiation oncology.   PLAN:   #1 patient will proceed with her scheduled Rituxan.However I do think that the patient will need systemic chemotherapy. I think the addition of treanda (bendamustine). I will do some literature search for her and I will plan on discussing this with her on her next visit. I do think that patient right now is insignificant shock.   #2 patient had a PET scan performed which does indeed confirm recurrence of disease. I do think that we need to add tree and that too patient's regimen. I have discussed this with her. I explained once again patient's diagnosis of lymphoma as well as her breast cancer. I do think patient does not have a grasp on her diagnoses at all. She didn't seem to be quite in shock even though I have explained this to her over and over again on her previous visits. This time I did discuss with the patient that she should be having her family, with her because the treatments are going to be quite complex. At least during the visit patient did demonstrate understanding.  #3 I will get an  MRI of the lumbar spine Positive for metastasis in L4 lumbar spine.. Therefore I will refer her to radiation oncology for palliative radiation therapy.  All questions were answered. The patient knows to call the clinic with any problems, questions or concerns. We can certainly see the patient much sooner if necessary.  I spent 25 minutes counseling the patient face to face. The total time spent in the appointment was 30 minutes.  Drue Second, MD Medical/Oncology [facility] [phone] (beeper) [phone] (Office)  07/12/2012, 1:26 PM

## 2012-07-16 ENCOUNTER — Encounter: Payer: Self-pay | Admitting: Radiation Oncology

## 2012-07-16 ENCOUNTER — Ambulatory Visit
Admission: RE | Admit: 2012-07-16 | Discharge: 2012-07-16 | Disposition: A | Discharge status: Home / Self Care | Payer: Medicare Other | Source: Ambulatory Visit | Attending: Radiation Oncology | Admitting: Radiation Oncology

## 2012-07-19 ENCOUNTER — Ambulatory Visit
Admission: RE | Admit: 2012-07-19 | Discharge: 2012-07-19 | Disposition: A | Discharge status: Home / Self Care | Payer: Medicare Other | Source: Ambulatory Visit | Attending: Radiation Oncology | Admitting: Radiation Oncology

## 2012-07-20 ENCOUNTER — Ambulatory Visit
Admission: RE | Admit: 2012-07-20 | Discharge: 2012-07-20 | Disposition: A | Discharge status: Home / Self Care | Payer: Medicare Other | Source: Ambulatory Visit | Attending: Radiation Oncology | Admitting: Radiation Oncology

## 2012-07-21 ENCOUNTER — Telehealth: Payer: Self-pay | Admitting: *Deleted

## 2012-07-21 ENCOUNTER — Ambulatory Visit (HOSPITAL_BASED_OUTPATIENT_CLINIC_OR_DEPARTMENT_OTHER): Payer: Medicare Other | Admitting: Oncology

## 2012-07-21 ENCOUNTER — Other Ambulatory Visit (HOSPITAL_BASED_OUTPATIENT_CLINIC_OR_DEPARTMENT_OTHER): Payer: Medicare Other | Admitting: Lab

## 2012-07-21 ENCOUNTER — Ambulatory Visit
Admission: RE | Admit: 2012-07-21 | Discharge: 2012-07-21 | Disposition: A | Discharge status: Home / Self Care | Payer: Medicare Other | Source: Ambulatory Visit | Attending: Radiation Oncology | Admitting: Radiation Oncology

## 2012-07-21 ENCOUNTER — Encounter: Payer: Self-pay | Admitting: Oncology

## 2012-07-21 VITALS — BP 137/76 | HR 64 | Temp 97.8°F | Resp 20 | Ht 62.0 in | Wt 132.4 lb

## 2012-07-21 DIAGNOSIS — C7951 Secondary malignant neoplasm of bone: Secondary | ICD-10-CM

## 2012-07-21 DIAGNOSIS — C8589 Other specified types of non-Hodgkin lymphoma, extranodal and solid organ sites: Secondary | ICD-10-CM

## 2012-07-21 DIAGNOSIS — C8590 Non-Hodgkin lymphoma, unspecified, unspecified site: Secondary | ICD-10-CM

## 2012-07-21 DIAGNOSIS — C8300 Small cell B-cell lymphoma, unspecified site: Secondary | ICD-10-CM

## 2012-07-21 DIAGNOSIS — Z853 Personal history of malignant neoplasm of breast: Secondary | ICD-10-CM

## 2012-07-21 DIAGNOSIS — C8599 Non-Hodgkin lymphoma, unspecified, extranodal and solid organ sites: Secondary | ICD-10-CM

## 2012-07-21 DIAGNOSIS — C50919 Malignant neoplasm of unspecified site of unspecified female breast: Secondary | ICD-10-CM

## 2012-07-21 LAB — CBC WITH DIFFERENTIAL/PLATELET
BASO%: 0 % (ref 0.0–2.0)
Basophils Absolute: 0 10*3/uL (ref 0.0–0.1)
EOS%: 0 % (ref 0.0–7.0)
HCT: 35.2 % (ref 34.8–46.6)
HGB: 11.3 g/dL — ABNORMAL LOW (ref 11.6–15.9)
LYMPH%: 1.4 % — ABNORMAL LOW (ref 14.0–49.7)
MCH: 26 pg (ref 25.1–34.0)
MCHC: 32.1 g/dL (ref 31.5–36.0)
MCV: 80.9 fL (ref 79.5–101.0)
MONO%: 6.5 % (ref 0.0–14.0)
NEUT%: 92.1 % — ABNORMAL HIGH (ref 38.4–76.8)
lymph#: 0.3 10*3/uL — ABNORMAL LOW (ref 0.9–3.3)

## 2012-07-21 LAB — BASIC METABOLIC PANEL (CC13)
BUN: 15 mg/dL (ref 7.0–26.0)
Calcium: 9.9 mg/dL (ref 8.4–10.4)
Creatinine: 0.7 mg/dL (ref 0.6–1.1)
Potassium: 4.2 mEq/L (ref 3.5–5.1)

## 2012-07-21 MED ORDER — NYSTATIN 100000 UNIT/ML MT SUSP: 500000.0000 [IU] | Freq: Four times a day (QID) | OROMUCOSAL | Status: DC

## 2012-07-21 NOTE — Telephone Encounter (Signed)
Gave patient appointment 08-20-2012 starting at 11:00am

## 2012-07-21 NOTE — Patient Instructions (Addendum)
Continue radiation therapy as you are  For Thrush in your mouth I have sent a prescription for nystatin [place] as directed on the bottle.  I will see you back in 1 month

## 2012-07-21 NOTE — Progress Notes (Signed)
OFFICE PROGRESS NOTE  CC  [name], MD [address] [facility] [address] [place], [address] [place] [address]  DIAGNOSIS: Your-year-old female with:  #1 take left breast carcinoma originally presenting as a fungating mass. Patient underwent neoadjuvant chemotherapy followed by mastectomy. She is currently on letrozole 2.5 mg daily.  #2 low-grade non-Hodgkin lymphoma status post CVP and Rituxan now receiving maintenance Rituxan.  PRIOR THERAPY:  #1 the patient was diagnosed with metastatic breast carcinoma beginning October 2009. She returned her 1 chemotherapy followed by mastectomy radiation. She has been on letrozole 2.5 mg daily tolerating it well.  #2 Was diagnosed with low-grade non-Hodgkin lymphoma  12. She received 6 cycles of CVP and Rituxan. Last week for maintenance Rituxan. She is to receive 4 weeks in a row every 6 months for a total of 2 years.  CURRENT THERAPY:  radiation oncology for palliative radiation therapy to the lumbar spine.  INTERVAL HISTORY: Helen [name] 77 y.o. female returns for Visit today. Patient was seen in the emergency room over Monday. She had significant pain in the lower back. Her MRI from the weekend revealed painful L4 metastases. I do think this is the reason why she was having pain that she told me about on recent visit. She is today in a wheelchair. She was seen here today without any family members. I did try to get in touch with her son who unfortunately works at night and I was not able to touch bases with him and I will try to do so again. Patient knows that she has an appointment with radiation oncology tomorrow. Patient otherwise denies any nausea vomiting fevers chills she has no headaches or double vision. Remainder of the review of systems is unremarkable.[name] MEDICAL HISTORY: Past Medical History  Diagnosis Date  . Arthritis   . Blood transfusion 2009  . Breast CA 08/2008    (LT) breast ca dx 10/09/Chemo  . Lymphoma 09/09/2011    NHL  .  Leukemia, acute, in remission 2012    Pt. not sure of type  . Hypertension   . Seizures 1980's    from medication reaction/Pt.  [name] Hx of radiation therapy 09/11/08 -10/31/08    left breast  . Metastasis to bone 07/03/2012    MRI L spine    ALLERGIES:  is allergic to codeine; penicillins; sulfonamide derivatives; tape; and tramadol hcl.  MEDICATIONS:  Current Outpatient Prescriptions  Medication Sig Dispense Refill  . celecoxib (CELEBREX) 200 MG capsule Take 200 mg by mouth daily as needed. For arthritis      . dexamethasone (DECADRON) 4 MG tablet Take 4 mg by mouth 2 (two) times daily with a meal.      . letrozole (FEMARA) 2.5 MG tablet Take 1 tablet (2.5 mg total) by mouth daily.  30 tablet  12  . oxycodone (OXY-IR) 5 MG capsule Take 5 mg by mouth every 4 (four) hours as needed.       Current Facility-Administered Medications  Medication Dose Route Frequency Provider Last Rate Last Dose  . 0.9 %  sodium chloride infusion  500 mL Intravenous Continuous Louis Meckel, MD        SURGICAL HISTORY:  Past Surgical History  Procedure Date  . Mastectomy   . Thyroidectomy 1965  . Appendectomy   . Tonsillectomy     REVIEW OF SYSTEMS:  Pertinent items are noted in HPI.   PHYSICAL EXAMINATION: General appearance: alert, cooperative and appears stated age Neck: no adenopathy, no carotid  bruit, no JVD, supple, symmetrical, trachea midline and thyroid not enlarged, symmetric, no tenderness/mass/nodules Lymph nodes: Cervical, supraclavicular, and axillary nodes normal. Resp: clear to auscultation bilaterally and normal percussion bilaterally Back: symmetric, no curvature. ROM normal. No CVA tenderness. Cardio: regular rate and rhythm, S1, S2 normal, no murmur, click, rub or gallop and normal apical impulse GI: soft, non-tender; bowel sounds normal; no masses,  no organomegaly Extremities: extremities normal, atraumatic, no cyanosis or edema Neurologic: Alert and oriented X 3, normal  strength and tone. Normal symmetric reflexes. Normal coordination and gait  ECOG PERFORMANCE STATUS: 1 - Symptomatic but completely ambulatory  Blood pressure 137/76, pulse 64, temperature 97.8 F (36.6 C), temperature source Oral, resp. rate 20, height 5\' 2"  (1.575 m), weight 132 lb 6.4 oz (60.056 kg).  LABORATORY DATA: Lab Results  Component Value Date   WBC 18.0* 07/21/2012   HGB 11.3* 07/21/2012   HCT 35.2 07/21/2012   MCV 80.9 07/21/2012   PLT 362 07/21/2012      Chemistry      Component Value Date/Time   NA 134* 07/21/2012 1008   NA 136 07/06/2012 1122   NA 142 06/01/2012 1008   K 4.2 07/21/2012 1008   K 3.8 07/06/2012 1122   K 3.8 06/01/2012 1008   CL 100 07/21/2012 1008   CL 102 07/06/2012 1122   CL 103 06/01/2012 1008   CO2 25 07/21/2012 1008   CO2 20 07/06/2012 1122   CO2 25 06/01/2012 1008   BUN 15.0 07/21/2012 1008   BUN 8 07/06/2012 1122   BUN 9 06/01/2012 1008   CREATININE 0.7 07/21/2012 1008   CREATININE 0.60 07/06/2012 1122   CREATININE 0.9 06/01/2012 1008      Component Value Date/Time   CALCIUM 9.9 07/21/2012 1008   CALCIUM 9.1 07/06/2012 1122   CALCIUM 9.8 06/01/2012 1008   ALKPHOS 131 06/29/2012 1144   ALKPHOS 69 02/10/2012 1035   ALKPHOS 78 08/14/2010 1251   AST 26 06/29/2012 1144   AST 20 02/10/2012 1035   AST 19 08/14/2010 1251   ALT 68* 06/29/2012 1144   ALT 19 02/10/2012 1035   BILITOT 0.30 06/29/2012 1144   BILITOT 0.4 02/10/2012 1035   BILITOT 0.40 08/14/2010 1251       RADIOGRAPHIC STUDIES: NUCLEAR MEDICINE PET CT SKULL BASE TO THIGH  Technique: Technique: 18.8 mCi F-18 FDG was injected  intravenously. CT data was obtained and used for attenuation  correction and anatomic localization only. (This was not acquired  as a diagnostic CT examination.) Additional exam technical data  entered on technologist worksheet.  Comparison: PET of 02/07/2011. CTs of 06/01/2012.  Findings: Neck: Hypermetabolism within the left side of the  mandible, with suspicion of concurrent  periapical focal lucency on  image 24.  Bilateral cervical hypermetabolic lymph nodes. Hypermetabolism  which is primarily felt to be nodal surrounds the right jugular  vein and extends into the thoracic inlet. This measures a S.U.V.  max of 9.9 on image 57  Chest: Hypermetabolic right axillary node. This measures 1.7 cm  and a S.U.V. max of 6.1 on image 82. On the prior PET, this node  measured 1.7 cm and a S.U.V. max of 3.1.  Small prevascular nodes which are hypermetabolic, including on  image 72.  Abdomen/Pelvis: Right adrenal hypermetabolism which is without CT  correlate. Hypermetabolic left external iliac adenopathy. This  measures 1.3 cm and a S.U.V. max of 5.0 on image 181. On the prior  PET, this node measured 1.6 cm  and on the order of a S.U.V. max of  2.6.  Skelton: Multifocal marrow hypermetabolism consistent with osseous  involvement. Index lesion in the right humeral head measures a  S.U.V. max of 14.4 on image 53 and is new.  A hypermetabolic lytic lesion within the left iliac wing is new or  progressive and measures a S.U.V. max of 8.1 on image 155.  CT images performed for attenuation correction demonstrate  increased number and size of lymph nodes within the neck. Index  right jugulodigastric node measures 9 mm short axis on image 34  versus 6 mm on the prior PET. Chest, abdomen, and pelvic findings  deferred to recent diagnostic CTs. Trace right-sided pleural fluid  is new. Left upper lobe radiation fibrosis. Punctate left renal  calculi. Cholelithiasis. Fibroid uterus. L4 osseous metastasis  has possible epidural component on image 144 transverse.  IMPRESSION:  1. Increasing hypermetabolism associated with adenopathy within  the neck, chest, and pelvis, as detailed above. Findings are most  likely indicative of progression of lymphoma.  2. Progressive osseous metastasis, as detailed above. An L4 lesion  has possible epidural component. Consider pre and post  contrast  lumbar spine MRI.  3. Hypermetabolism at the right lower neck and thoracic inlet  surrounds the right internal jugular vein. This appears somewhat  hyperattenuating. Cannot exclude thrombus. Consider right upper  extremity venous ultrasound with attention to this area.  4. Likely dental inflammatory left mandibular hypermetabolism.  Consider physical exam correlation.   ASSESSMENT: 77 year old female with  #1Patient with history of low-grade non-Hodgkin lymphoma she initially received CVP Rituxan and then subsequently on maintenance Rituxan.   #2 metastatic breast carcinoma originally diagnosed in 2009 now maintained on letrozole 2.5 mg daily.  #3 patient with recurrent low-grade lymphoma she will begin Rituxan and creatinine.in one month's time.  #4 L4 bone metastasis which is painful. Patient will be referred to radiation oncology.   PLAN:  #1 patient is getting radiation therapy to painful bony metastasis.  #2 I will see her back after she completes her radiation. Patient does understand that she has progressive lymphoma as well as recurrent breast cancer. We will have to treat both with different agents now. For her lymphoma I will do tree and a with Rituxan. For breast cancer we will change her to a different antiestrogen therapy.  All questions were answered. The patient knows to call the clinic with any problems, questions or concerns. We can certainly see the patient much sooner if necessary.  I spent 25 minutes counseling the patient face to face. The total time spent in the appointment was 30 minutes.    Drue Second, MD Medical/Oncology [facility] [phone] (beeper) [phone] (Office)  07/21/2012, 11:23 AM

## 2012-07-22 ENCOUNTER — Ambulatory Visit
Admission: RE | Admit: 2012-07-22 | Discharge: 2012-07-22 | Disposition: A | Discharge status: Home / Self Care | Payer: Medicare Other | Source: Ambulatory Visit | Attending: Radiation Oncology | Admitting: Radiation Oncology

## 2012-07-22 VITALS — BP 122/59 | HR 64 | Temp 97.5°F | Wt 132.9 lb

## 2012-07-22 DIAGNOSIS — C7951 Secondary malignant neoplasm of bone: Secondary | ICD-10-CM

## 2012-07-22 NOTE — Progress Notes (Signed)
Patient education completed.Reviewed clinic routine and to continue to take pain medications as radiation not effective yet.

## 2012-07-22 NOTE — Progress Notes (Signed)
Patient here for routine weekly assessment of right shoulder and l-spine.Has completed 4 of 10 treatment.Generalized fatigue.Tongue coated, started on nystatin on 07/21/12.Tried to encourage patient to take pain medication in morning prior to coming for treatment as she did not want to mix with other am medications.Told her it would be more beneficial to take at that time before pain gets out of control.Continues to take dexamethasone bid.

## 2012-07-22 NOTE — Progress Notes (Signed)
   Department of Radiation Oncology  Phone:  [phone] Fax:        [phone]  Weekly Treatment Note    Name: Helen [name] Date: 07/22/2012 MRN: [id number] DOB: 1935/08/07   Current dose: 10 Gy  Current fraction: 4   MEDICATIONS: Current Outpatient Prescriptions  Medication Sig Dispense Refill  . celecoxib (CELEBREX) 200 MG capsule Take 200 mg by mouth daily as needed. For arthritis      . dexamethasone (DECADRON) 4 MG tablet Take 4 mg by mouth 2 (two) times daily with a meal.      . letrozole (FEMARA) 2.5 MG tablet Take 1 tablet (2.5 mg total) by mouth daily.  30 tablet  12  . nystatin (MYCOSTATIN) 100000 UNIT/ML suspension Take 5 mLs (500,000 Units total) by mouth 4 (four) times daily.  60 mL  4  . oxycodone (OXY-IR) 5 MG capsule Take 5 mg by mouth every 4 (four) hours as needed.       Current Facility-Administered Medications  Medication Dose Route Frequency Provider Last Rate Last Dose  . 0.9 %  sodium chloride infusion  500 mL Intravenous Continuous Louis Meckel, MD         ALLERGIES: Codeine; Penicillins; Sulfonamide derivatives; Tape; and Tramadol hcl   LABORATORY DATA:  Lab Results  Component Value Date   WBC 18.0* 07/21/2012   HGB 11.3* 07/21/2012   HCT 35.2 07/21/2012   MCV 80.9 07/21/2012   PLT 362 07/21/2012   Lab Results  Component Value Date   NA 134* 07/21/2012   K 4.2 07/21/2012   CL 100 07/21/2012   CO2 25 07/21/2012   Lab Results  Component Value Date   ALT 68* 06/29/2012   AST 26 06/29/2012   ALKPHOS 131 06/29/2012   BILITOT 0.30 06/29/2012     NARRATIVE: Taquisha Phung was seen today for weekly treatment management. The chart was checked and the patient's films were reviewed.  The patient is doing fairly well overall with her treatment. The patient is complaining of some pain I believe from lying on the table. She is not take her pain medication before treatment today as she does take a number of other medications in the morning. She did  not want to take too much at the same time. She has begun nystatin for thrush. She continues to take Decadron twice a day.   PHYSICAL EXAMINATION: weight is 132 lb 14.4 oz (60.283 kg). Her temperature is 97.5 F (36.4 C). Her blood pressure is 122/59 and her pulse is 64.        ASSESSMENT: The patient is doing satisfactorily with treatment.  PLAN: We will continue with the patient's radiation treatment as planned. Patient will continue her current medications at this time and I did encourage her to take pain medicine before she comes for treatment. I believe that we can begin to taper her steroid dose next week.

## 2012-07-23 ENCOUNTER — Ambulatory Visit
Admission: RE | Admit: 2012-07-23 | Discharge: 2012-07-23 | Disposition: A | Discharge status: Home / Self Care | Payer: Medicare Other | Source: Ambulatory Visit | Attending: Radiation Oncology | Admitting: Radiation Oncology

## 2012-07-26 ENCOUNTER — Ambulatory Visit
Admission: RE | Admit: 2012-07-26 | Discharge: 2012-07-26 | Disposition: A | Discharge status: Home / Self Care | Payer: Medicare Other | Source: Ambulatory Visit | Attending: Radiation Oncology | Admitting: Radiation Oncology

## 2012-07-27 ENCOUNTER — Ambulatory Visit
Admission: RE | Admit: 2012-07-27 | Discharge: 2012-07-27 | Disposition: A | Discharge status: Home / Self Care | Payer: Medicare Other | Source: Ambulatory Visit | Attending: Radiation Oncology | Admitting: Radiation Oncology

## 2012-07-28 ENCOUNTER — Ambulatory Visit
Admission: RE | Admit: 2012-07-28 | Discharge: 2012-07-28 | Disposition: A | Discharge status: Home / Self Care | Payer: Medicare Other | Source: Ambulatory Visit | Attending: Radiation Oncology | Admitting: Radiation Oncology

## 2012-07-29 ENCOUNTER — Ambulatory Visit
Admission: RE | Admit: 2012-07-29 | Discharge: 2012-07-29 | Disposition: A | Discharge status: Home / Self Care | Payer: Medicare Other | Source: Ambulatory Visit | Attending: Radiation Oncology | Admitting: Radiation Oncology

## 2012-07-29 VITALS — BP 116/55 | HR 70 | Temp 98.3°F | Wt 135.1 lb

## 2012-07-29 DIAGNOSIS — C7951 Secondary malignant neoplasm of bone: Secondary | ICD-10-CM

## 2012-07-29 NOTE — Progress Notes (Signed)
Patient here for routine weekly under treat for right shoulder and L-spine radiation.Patient completes tomorrow.Given appointment card to schedule one month follow up.continues to take dexamethasone 4 mg bid.

## 2012-07-29 NOTE — Progress Notes (Signed)
   Department of Radiation Oncology  Phone:  [phone] Fax:        [phone]  Weekly Treatment Note    Name: Helen [name] Date: 07/29/2012 MRN: [id number] DOB: 1935-09-04   Current dose: 22.5 Gy  Current fraction: 9   MEDICATIONS: Current Outpatient Prescriptions  Medication Sig Dispense Refill  . celecoxib (CELEBREX) 200 MG capsule Take 200 mg by mouth daily as needed. For arthritis      . dexamethasone (DECADRON) 4 MG tablet Take 4 mg by mouth 2 (two) times daily with a meal.      . letrozole (FEMARA) 2.5 MG tablet Take 1 tablet (2.5 mg total) by mouth daily.  30 tablet  12  . nystatin (MYCOSTATIN) 100000 UNIT/ML suspension Take 5 mLs (500,000 Units total) by mouth 4 (four) times daily.  60 mL  4  . oxycodone (OXY-IR) 5 MG capsule Take 5 mg by mouth every 4 (four) hours as needed.       Current Facility-Administered Medications  Medication Dose Route Frequency Provider Last Rate Last Dose  . 0.9 %  sodium chloride infusion  500 mL Intravenous Continuous Louis Meckel, MD         ALLERGIES: Codeine; Penicillins; Sulfonamide derivatives; Tape; and Tramadol hcl   LABORATORY DATA:  Lab Results  Component Value Date   WBC 18.0* 07/21/2012   HGB 11.3* 07/21/2012   HCT 35.2 07/21/2012   MCV 80.9 07/21/2012   PLT 362 07/21/2012   Lab Results  Component Value Date   NA 134* 07/21/2012   K 4.2 07/21/2012   CL 100 07/21/2012   CO2 25 07/21/2012   Lab Results  Component Value Date   ALT 68* 06/29/2012   AST 26 06/29/2012   ALKPHOS 131 06/29/2012   BILITOT 0.30 06/29/2012     NARRATIVE: Helen [name] was seen today for weekly treatment management. The chart was checked and the patient's films were reviewed. The patient complains of the treatment table being somewhat hard. She indicates that she is not having any current pelvic pain and no shoulder pain on the right at this time also. No GI toxicity. The patient is taking Decadron twice a day.  PHYSICAL EXAMINATION:  weight is 135 lb 1.6 oz (61.281 kg). Her temperature is 98.3 F (36.8 C). Her blood pressure is 116/55 and her pulse is 70.        ASSESSMENT: The patient is doing satisfactorily with treatment.  PLAN: We will continue with the patient's radiation treatment as planned. Instructions were given to taper her steroids over the next couple of weeks. She will followup in our clinic in one month.

## 2012-07-30 ENCOUNTER — Ambulatory Visit
Admission: RE | Admit: 2012-07-30 | Discharge: 2012-07-30 | Disposition: A | Discharge status: Home / Self Care | Payer: Medicare Other | Source: Ambulatory Visit | Attending: Radiation Oncology | Admitting: Radiation Oncology

## 2012-07-30 DIAGNOSIS — C7951 Secondary malignant neoplasm of bone: Secondary | ICD-10-CM

## 2012-08-05 ENCOUNTER — Ambulatory Visit (HOSPITAL_BASED_OUTPATIENT_CLINIC_OR_DEPARTMENT_OTHER): Payer: Medicare Other | Admitting: Oncology

## 2012-08-05 ENCOUNTER — Other Ambulatory Visit (HOSPITAL_BASED_OUTPATIENT_CLINIC_OR_DEPARTMENT_OTHER): Payer: Medicare Other | Admitting: Lab

## 2012-08-05 ENCOUNTER — Ambulatory Visit (HOSPITAL_BASED_OUTPATIENT_CLINIC_OR_DEPARTMENT_OTHER): Payer: Medicare Other | Admitting: Lab

## 2012-08-05 ENCOUNTER — Telehealth: Payer: Self-pay | Admitting: *Deleted

## 2012-08-05 ENCOUNTER — Ambulatory Visit: Payer: Medicare Other

## 2012-08-05 VITALS — BP 146/81 | HR 75 | Temp 98.5°F | Resp 20 | Ht 62.0 in | Wt 130.8 lb

## 2012-08-05 DIAGNOSIS — C7952 Secondary malignant neoplasm of bone marrow: Secondary | ICD-10-CM

## 2012-08-05 DIAGNOSIS — C8590 Non-Hodgkin lymphoma, unspecified, unspecified site: Secondary | ICD-10-CM

## 2012-08-05 DIAGNOSIS — C7951 Secondary malignant neoplasm of bone: Secondary | ICD-10-CM

## 2012-08-05 DIAGNOSIS — K759 Inflammatory liver disease, unspecified: Secondary | ICD-10-CM

## 2012-08-05 DIAGNOSIS — C50919 Malignant neoplasm of unspecified site of unspecified female breast: Secondary | ICD-10-CM

## 2012-08-05 DIAGNOSIS — C8599 Non-Hodgkin lymphoma, unspecified, extranodal and solid organ sites: Secondary | ICD-10-CM

## 2012-08-05 DIAGNOSIS — C8589 Other specified types of non-Hodgkin lymphoma, extranodal and solid organ sites: Secondary | ICD-10-CM

## 2012-08-05 DIAGNOSIS — C8300 Small cell B-cell lymphoma, unspecified site: Secondary | ICD-10-CM

## 2012-08-05 LAB — CBC WITH DIFFERENTIAL/PLATELET
Eosinophils Absolute: 0 10*3/uL (ref 0.0–0.5)
MONO#: 0.3 10*3/uL (ref 0.1–0.9)
NEUT#: 4.3 10*3/uL (ref 1.5–6.5)
Platelets: 206 10*3/uL (ref 145–400)
RBC: 4.07 10*6/uL (ref 3.70–5.45)
RDW: 15.8 % — ABNORMAL HIGH (ref 11.2–14.5)
WBC: 5.1 10*3/uL (ref 3.9–10.3)
lymph#: 0.4 10*3/uL — ABNORMAL LOW (ref 0.9–3.3)
nRBC: 0 % (ref 0–0)

## 2012-08-05 LAB — BASIC METABOLIC PANEL (CC13)
CO2: 23 mEq/L (ref 22–29)
Chloride: 104 mEq/L (ref 98–107)
Potassium: 3.8 mEq/L (ref 3.5–5.1)
Sodium: 138 mEq/L (ref 136–145)

## 2012-08-05 NOTE — Telephone Encounter (Signed)
Made patient appointment for 08-12-2012 and 08-13-2012  [facility] email to set up patient's treatment

## 2012-08-05 NOTE — Patient Instructions (Signed)
You will begin chemotherapy for your lymphoma on 10/10  Change the pill for breast cancer to aromasin 25 mg daily, stop taking the femara  I will see you back in 1 week to begin first cycle of treanda and rituxan

## 2012-08-05 NOTE — Telephone Encounter (Signed)
Per staff message and POF I have scheduled appts. Unable to schedule treatment for 10/ and 12/5 due to MD appt. Scheduler and MD sent message.    JMW

## 2012-08-05 NOTE — Progress Notes (Signed)
OFFICE PROGRESS NOTE  CC  [name], MD [address] [facility] [address] [place], [address] [place] [address]  DIAGNOSIS: Your-year-old female with:  #1 take left breast carcinoma originally presenting as a fungating mass. Patient underwent neoadjuvant chemotherapy followed by mastectomy. She is currently on letrozole 2.5 mg daily.  #2 low-grade non-Hodgkin lymphoma status post CVP and Rituxan now receiving maintenance Rituxan.  PRIOR THERAPY:  #1 the patient was diagnosed with metastatic breast carcinoma beginning October 2009. She returned her 1 chemotherapy followed by mastectomy radiation. She has been on letrozole 2.5 mg daily tolerating it well.  #2 Was diagnosed with low-grade non-Hodgkin lymphoma  12. She received 6 cycles of CVP and Rituxan. Last week for maintenance Rituxan. She is to receive 4 weeks in a row every 6 months for a total of 2 years.  CURRENT THERAPY:  radiation oncology for palliative radiation therapy to the lumbar spine.  INTERVAL HISTORY: Helen [name] 77 y.o. female returns for Visit today. Patient has now completed her radiation therapy her pain is significantly improved she is now ambulatory and therefore has had a positive effect from her radiation. She otherwise denies any nausea vomiting fevers chills night sweats headaches. Once again patient came alone. She and I discussed the diagnosis of recurrent lymphoma as well as progressive breast cancer. She is going to be changing treatments from letrozole to Arimidex for her breast cancer. And we will be treating her with bendamustine and Rituxan for her lymphoma. MEDICAL HISTORY: Past Medical History  Diagnosis Date  . Arthritis   . Blood transfusion 2009  . Breast CA 08/2008    (LT) breast ca dx 10/09/Chemo  . Lymphoma 09/09/2011    NHL  . Leukemia, acute, in remission 2012    Pt. not sure of type  . Hypertension   . Seizures 1980's    from medication reaction/Pt.  [name] Hx of radiation therapy 09/11/08  -10/31/08    left breast  . Metastasis to bone 07/03/2012    MRI L spine    ALLERGIES:  is allergic to codeine; penicillins; sulfonamide derivatives; tape; and tramadol hcl.  MEDICATIONS:  Current Outpatient Prescriptions  Medication Sig Dispense Refill  . dexamethasone (DECADRON) 4 MG tablet Take 4 mg by mouth 2 (two) times daily with a meal.      . letrozole (FEMARA) 2.5 MG tablet Take 1 tablet (2.5 mg total) by mouth daily.  30 tablet  12  . oxycodone (OXY-IR) 5 MG capsule Take 5 mg by mouth every 4 (four) hours as needed.      . nystatin (MYCOSTATIN) 100000 UNIT/ML suspension Take 5 mLs (500,000 Units total) by mouth 4 (four) times daily.  60 mL  4   Current Facility-Administered Medications  Medication Dose Route Frequency Provider Last Rate Last Dose  . DISCONTD: 0.9 %  sodium chloride infusion  500 mL Intravenous Continuous Louis Meckel, MD        SURGICAL HISTORY:  Past Surgical History  Procedure Date  . Mastectomy   . Thyroidectomy 1965  . Appendectomy   . Tonsillectomy     REVIEW OF SYSTEMS:  Pertinent items are noted in HPI.   PHYSICAL EXAMINATION: General appearance: alert, cooperative and appears stated age Neck: no adenopathy, no carotid bruit, no JVD, supple, symmetrical, trachea midline and thyroid not enlarged, symmetric, no tenderness/mass/nodules Lymph nodes: Cervical, supraclavicular, and axillary nodes normal. Resp: clear to auscultation bilaterally and normal percussion bilaterally Back: symmetric, no curvature. ROM normal. No CVA  tenderness. Cardio: regular rate and rhythm, S1, S2 normal, no murmur, click, rub or gallop and normal apical impulse GI: soft, non-tender; bowel sounds normal; no masses,  no organomegaly Extremities: extremities normal, atraumatic, no cyanosis or edema Neurologic: Alert and oriented X 3, normal strength and tone. Normal symmetric reflexes. Normal coordination and gait  ECOG PERFORMANCE STATUS: 1 - Symptomatic but  completely ambulatory  Blood pressure 146/81, pulse 75, temperature 98.5 F (36.9 C), temperature source Oral, resp. rate 20, height 5\' 2"  (1.575 m), weight 130 lb 12.8 oz (59.33 kg).  LABORATORY DATA: Lab Results  Component Value Date   WBC 5.1 08/05/2012   HGB 10.4* 08/05/2012   HCT 32.7* 08/05/2012   MCV 80.3 08/05/2012   PLT 206 08/05/2012      Chemistry      Component Value Date/Time   NA 138 08/05/2012 0949   NA 136 07/06/2012 1122   NA 142 06/01/2012 1008   K 3.8 08/05/2012 0949   K 3.8 07/06/2012 1122   K 3.8 06/01/2012 1008   CL 104 08/05/2012 0949   CL 102 07/06/2012 1122   CL 103 06/01/2012 1008   CO2 23 08/05/2012 0949   CO2 20 07/06/2012 1122   CO2 25 06/01/2012 1008   BUN 14.0 08/05/2012 0949   BUN 8 07/06/2012 1122   BUN 9 06/01/2012 1008   CREATININE 0.7 08/05/2012 0949   CREATININE 0.60 07/06/2012 1122   CREATININE 0.9 06/01/2012 1008      Component Value Date/Time   CALCIUM 9.5 08/05/2012 0949   CALCIUM 9.1 07/06/2012 1122   CALCIUM 9.8 06/01/2012 1008   ALKPHOS 131 06/29/2012 1144   ALKPHOS 69 02/10/2012 1035   ALKPHOS 78 08/14/2010 1251   AST 26 06/29/2012 1144   AST 20 02/10/2012 1035   AST 19 08/14/2010 1251   ALT 68* 06/29/2012 1144   ALT 19 02/10/2012 1035   BILITOT 0.30 06/29/2012 1144   BILITOT 0.4 02/10/2012 1035   BILITOT 0.40 08/14/2010 1251       RADIOGRAPHIC STUDIES: NUCLEAR MEDICINE PET CT SKULL BASE TO THIGH  Technique: Technique: 18.8 mCi F-18 FDG was injected  intravenously. CT data was obtained and used for attenuation  correction and anatomic localization only. (This was not acquired  as a diagnostic CT examination.) Additional exam technical data  entered on technologist worksheet.  Comparison: PET of 02/07/2011. CTs of 06/01/2012.  Findings: Neck: Hypermetabolism within the left side of the  mandible, with suspicion of concurrent periapical focal lucency on  image 24.  Bilateral cervical hypermetabolic lymph nodes. Hypermetabolism  which is primarily felt  to be nodal surrounds the right jugular  vein and extends into the thoracic inlet. This measures a S.U.V.  max of 9.9 on image 57  Chest: Hypermetabolic right axillary node. This measures 1.7 cm  and a S.U.V. max of 6.1 on image 82. On the prior PET, this node  measured 1.7 cm and a S.U.V. max of 3.1.  Small prevascular nodes which are hypermetabolic, including on  image 72.  Abdomen/Pelvis: Right adrenal hypermetabolism which is without CT  correlate. Hypermetabolic left external iliac adenopathy. This  measures 1.3 cm and a S.U.V. max of 5.0 on image 181. On the prior  PET, this node measured 1.6 cm and on the order of a S.U.V. max of  2.6.  [place]: Multifocal marrow hypermetabolism consistent with osseous  involvement. Index lesion in the right humeral head measures a  S.U.V. max of 14.4 on image 53 and  is new.  A hypermetabolic lytic lesion within the left iliac wing is new or  progressive and measures a S.U.V. max of 8.1 on image 155.  CT images performed for attenuation correction demonstrate  increased number and size of lymph nodes within the neck. Index  right jugulodigastric node measures 9 mm short axis on image 34  versus 6 mm on the prior PET. Chest, abdomen, and pelvic findings  deferred to recent diagnostic CTs. Trace right-sided pleural fluid  is new. Left upper lobe radiation fibrosis. Punctate left renal  calculi. Cholelithiasis. Fibroid uterus. L4 osseous metastasis  has possible epidural component on image 144 transverse.  IMPRESSION:  1. Increasing hypermetabolism associated with adenopathy within  the neck, chest, and pelvis, as detailed above. Findings are most  likely indicative of progression of lymphoma.  2. Progressive osseous metastasis, as detailed above. An L4 lesion  has possible epidural component. Consider pre and post contrast  lumbar spine MRI.  3. Hypermetabolism at the right lower neck and thoracic inlet  surrounds the right internal jugular  vein. This appears somewhat  hyperattenuating. Cannot exclude thrombus. Consider right upper  extremity venous ultrasound with attention to this area.  4. Likely dental inflammatory left mandibular hypermetabolism.  Consider physical exam correlation.   ASSESSMENT: 77 year old female with  #1Patient with history of low-grade non-Hodgkin lymphoma she initially received CVP Rituxan and then subsequently on maintenance Rituxan.   #2 metastatic breast carcinoma originally diagnosed in 2009 now maintained on letrozole 2.5 mg daily.  #3 patient with recurrent low-grade lymphoma she will begin Rituxan and treanda.in one month's time.  #4 L4 bone metastasis which is painful. Patient is now status post palliative radiation therapy.  PLAN:  #1 patient will proceed with bendamustine and Rituxan starting on 08/12/2012 day 1 of bendamustine with Rituxan and day 2 of bendamustine only. I have dose reduced her to 70 mg per meter squared for now she is heavily pretreated.  #2 patient will also begin Arimidex 1 mg daily she will discontinue letrozole.  #3 risks and benefits of treatment were discussed with the patient he clearly. I will plan on seeing her back in one week's time for followup.  All questions were answered. The patient knows to call the clinic with any problems, questions or concerns. We can certainly see the patient much sooner if necessary.  I spent 25 minutes counseling the patient face to face. The total time spent in the appointment was 30 minutes.    Drue Second, MD Medical/Oncology [facility] [phone] (beeper) [phone] (Office)  08/05/2012, 3:13 PM

## 2012-08-06 ENCOUNTER — Telehealth: Payer: Self-pay | Admitting: *Deleted

## 2012-08-06 LAB — HEPATITIS B SURFACE ANTIBODY,QUALITATIVE: Hep B S Ab: NEGATIVE

## 2012-08-06 LAB — HEPATITIS B CORE ANTIBODY, TOTAL: Hep B Core Total Ab: NEGATIVE

## 2012-08-06 LAB — HEPATITIS B SURFACE ANTIGEN: Hepatitis B Surface Ag: NEGATIVE

## 2012-08-06 LAB — HEPATITIS B CORE ANTIBODY, IGM: Hep B C IgM: NEGATIVE

## 2012-08-06 NOTE — Telephone Encounter (Signed)
appts for 10/10 and 12/5 are in computer.  Helen [name]

## 2012-08-09 ENCOUNTER — Other Ambulatory Visit: Payer: Self-pay | Admitting: Emergency Medicine

## 2012-08-09 MED ORDER — ANASTROZOLE 1 MG PO TABS: 1.0000 mg | ORAL_TABLET | Freq: Every day | ORAL | Status: DC

## 2012-08-12 ENCOUNTER — Ambulatory Visit (HOSPITAL_BASED_OUTPATIENT_CLINIC_OR_DEPARTMENT_OTHER): Payer: Medicare Other

## 2012-08-12 ENCOUNTER — Other Ambulatory Visit (HOSPITAL_BASED_OUTPATIENT_CLINIC_OR_DEPARTMENT_OTHER): Payer: Medicare Other | Admitting: Lab

## 2012-08-12 ENCOUNTER — Telehealth: Payer: Self-pay | Admitting: *Deleted

## 2012-08-12 ENCOUNTER — Ambulatory Visit (HOSPITAL_BASED_OUTPATIENT_CLINIC_OR_DEPARTMENT_OTHER): Payer: Medicare Other | Admitting: Oncology

## 2012-08-12 VITALS — BP 103/63 | HR 83 | Temp 97.3°F | Resp 20

## 2012-08-12 VITALS — BP 126/78 | HR 96 | Temp 98.6°F | Resp 20 | Ht 62.0 in | Wt 130.3 lb

## 2012-08-12 DIAGNOSIS — C8588 Other specified types of non-Hodgkin lymphoma, lymph nodes of multiple sites: Secondary | ICD-10-CM

## 2012-08-12 DIAGNOSIS — C8589 Other specified types of non-Hodgkin lymphoma, extranodal and solid organ sites: Secondary | ICD-10-CM

## 2012-08-12 DIAGNOSIS — Z5111 Encounter for antineoplastic chemotherapy: Secondary | ICD-10-CM

## 2012-08-12 DIAGNOSIS — Z5112 Encounter for antineoplastic immunotherapy: Secondary | ICD-10-CM

## 2012-08-12 DIAGNOSIS — C8590 Non-Hodgkin lymphoma, unspecified, unspecified site: Secondary | ICD-10-CM

## 2012-08-12 DIAGNOSIS — C7951 Secondary malignant neoplasm of bone: Secondary | ICD-10-CM

## 2012-08-12 DIAGNOSIS — C50919 Malignant neoplasm of unspecified site of unspecified female breast: Secondary | ICD-10-CM

## 2012-08-12 DIAGNOSIS — Z853 Personal history of malignant neoplasm of breast: Secondary | ICD-10-CM

## 2012-08-12 LAB — TECHNOLOGIST REVIEW

## 2012-08-12 LAB — CBC WITH DIFFERENTIAL/PLATELET
BASO%: 0.6 % (ref 0.0–2.0)
Basophils Absolute: 0 10*3/uL (ref 0.0–0.1)
HCT: 31.6 % — ABNORMAL LOW (ref 34.8–46.6)
HGB: 10 g/dL — ABNORMAL LOW (ref 11.6–15.9)
LYMPH%: 6.3 % — ABNORMAL LOW (ref 14.0–49.7)
MCHC: 31.6 g/dL (ref 31.5–36.0)
MONO#: 0.5 10*3/uL (ref 0.1–0.9)
NEUT%: 76.7 % (ref 38.4–76.8)
Platelets: 230 10*3/uL (ref 145–400)
WBC: 3.5 10*3/uL — ABNORMAL LOW (ref 3.9–10.3)

## 2012-08-12 LAB — COMPREHENSIVE METABOLIC PANEL (CC13)
AST: 45 U/L — ABNORMAL HIGH (ref 5–34)
Albumin: 2.7 g/dL — ABNORMAL LOW (ref 3.5–5.0)
BUN: 7 mg/dL (ref 7.0–26.0)
CO2: 21 mEq/L — ABNORMAL LOW (ref 22–29)
Calcium: 9.3 mg/dL (ref 8.4–10.4)
Chloride: 100 mEq/L (ref 98–107)
Creatinine: 0.7 mg/dL (ref 0.6–1.1)
Glucose: 136 mg/dl — ABNORMAL HIGH (ref 70–99)

## 2012-08-12 MED ORDER — ONDANSETRON 8 MG/50ML IVPB (CHCC)
8.0000 mg | Freq: Once | INTRAVENOUS | Status: AC
  Administered 2012-08-12: 8 mg via INTRAVENOUS

## 2012-08-12 MED ORDER — DEXAMETHASONE SODIUM PHOSPHATE 10 MG/ML IJ SOLN
10.0000 mg | Freq: Once | INTRAMUSCULAR | Status: AC
  Administered 2012-08-12: 10 mg via INTRAVENOUS

## 2012-08-12 MED ORDER — SODIUM CHLORIDE 0.9 % IJ SOLN
10.0000 mL | INTRAMUSCULAR | Status: DC | PRN
  Administered 2012-08-12: 10 mL
  Filled 2012-08-12: qty 10

## 2012-08-12 MED ORDER — DIPHENHYDRAMINE HCL 25 MG PO CAPS
50.0000 mg | ORAL_CAPSULE | Freq: Once | ORAL | Status: AC
  Administered 2012-08-12: 50 mg via ORAL

## 2012-08-12 MED ORDER — HEPARIN SOD (PORK) LOCK FLUSH 100 UNIT/ML IV SOLN
500.0000 [IU] | Freq: Once | INTRAVENOUS | Status: AC | PRN
  Administered 2012-08-12: 500 [IU]
  Filled 2012-08-12: qty 5

## 2012-08-12 MED ORDER — SODIUM CHLORIDE 0.9 % IV SOLN
70.0000 mg/m2 | Freq: Once | INTRAVENOUS | Status: AC
  Administered 2012-08-12: 115 mg via INTRAVENOUS
  Filled 2012-08-12: qty 23

## 2012-08-12 MED ORDER — SODIUM CHLORIDE 0.9 % IV SOLN
375.0000 mg/m2 | Freq: Once | INTRAVENOUS | Status: AC
  Administered 2012-08-12: 600 mg via INTRAVENOUS
  Filled 2012-08-12: qty 60

## 2012-08-12 MED ORDER — ACETAMINOPHEN 325 MG PO TABS
650.0000 mg | ORAL_TABLET | Freq: Once | ORAL | Status: AC
  Administered 2012-08-12: 650 mg via ORAL

## 2012-08-12 MED ORDER — SODIUM CHLORIDE 0.9 % IV SOLN
Freq: Once | INTRAVENOUS | Status: AC
  Administered 2012-08-12: 11:00:00 via INTRAVENOUS

## 2012-08-12 NOTE — Progress Notes (Signed)
OFFICE PROGRESS NOTE  CC  Helen Linger, MD [address] [facility] [address] [place], [address]st [address] [place] [address]  DIAGNOSIS: Your-year-old female with:  #1 take left breast carcinoma originally presenting as a fungating mass. Patient underwent neoadjuvant chemotherapy followed by mastectomy. She is currently on letrozole 2.5 mg daily.  #2 low-grade non-Hodgkin lymphoma status post CVP and Rituxan now receiving maintenance Rituxan.  PRIOR THERAPY:  #1 the patient was diagnosed with metastatic breast carcinoma beginning October 2009. She returned her 1 chemotherapy followed by mastectomy radiation. She has been on letrozole 2.5 mg daily tolerating it well.  #2 Was diagnosed with low-grade non-Hodgkin lymphoma  12. She received 6 cycles of CVP and Rituxan. Last week for maintenance Rituxan. She is to receive 4 weeks in a row every 6 months for a total of 2 years.  CURRENT THERAPY:  Cycle 1 day 1of bendamustine and rituxan INTERVAL HISTORY: Helen [name] 77 y.o. female returns for Visit today. Clinically she feels a little bit better. Patient is accompanied by her daughter-in-law today. She otherwise is feeling well she does have some back pain which is ongoing especially in her hip. She denies any nausea vomiting she has no fevers no chills no night sweats. She has no myalgias. No peripheral paresthesias no cough no hemoptysis or hematemesis. Remainder of the review of systems is unremarkable.  MEDICAL HISTORY: Past Medical History  Diagnosis Date  . Arthritis   . Blood transfusion 2009  . Breast CA 08/2008    (LT) breast ca dx 10/09/Chemo  . Lymphoma 09/09/2011    NHL  . Leukemia, acute, in remission 2012    Pt. not sure of type  . Hypertension   . Seizures 1980's    from medication reaction/Pt.  [name] Hx of radiation therapy 09/11/08 -10/31/08    left breast  . Metastasis to bone 07/03/2012    MRI L spine    ALLERGIES:  is allergic to codeine; penicillins; sulfonamide derivatives;  tape; and tramadol hcl.  MEDICATIONS:  Current Outpatient Prescriptions  Medication Sig Dispense Refill  . anastrozole (ARIMIDEX) 1 MG tablet Take 1 tablet (1 mg total) by mouth daily.  30 tablet  6  . oxycodone (OXY-IR) 5 MG capsule Take 5 mg by mouth every 4 (four) hours as needed.        SURGICAL HISTORY:  Past Surgical History  Procedure Date  . Mastectomy   . Thyroidectomy 1965  . Appendectomy   . Tonsillectomy     REVIEW OF SYSTEMS:  Pertinent items are noted in HPI.   PHYSICAL EXAMINATION: General appearance: alert, cooperative and appears stated age Neck: no adenopathy, no carotid bruit, no JVD, supple, symmetrical, trachea midline and thyroid not enlarged, symmetric, no tenderness/mass/nodules Lymph nodes: Cervical, supraclavicular, and axillary nodes normal. Resp: clear to auscultation bilaterally and normal percussion bilaterally Back: symmetric, no curvature. ROM normal. No CVA tenderness. Cardio: regular rate and rhythm, S1, S2 normal, no murmur, click, rub or gallop and normal apical impulse GI: soft, non-tender; bowel sounds normal; no masses,  no organomegaly Extremities: extremities normal, atraumatic, no cyanosis or edema Neurologic: Alert and oriented X 3, normal strength and tone. Normal symmetric reflexes. Normal coordination and gait  ECOG PERFORMANCE STATUS: 1 - Symptomatic but completely ambulatory  Blood pressure 126/78, pulse 96, temperature 98.6 F (37 C), temperature source Oral, resp. rate 20, height 5\' 2"  (1.575 m), weight 130 lb 4.8 oz (59.104 kg).  LABORATORY DATA: Lab Results  Component Value Date  WBC 3.5* 08/12/2012   HGB 10.0* 08/12/2012   HCT 31.6* 08/12/2012   MCV 80.0 08/12/2012   PLT 230 08/12/2012      Chemistry      Component Value Date/Time   NA 138 08/05/2012 0949   NA 136 07/06/2012 1122   NA 142 06/01/2012 1008   K 3.8 08/05/2012 0949   K 3.8 07/06/2012 1122   K 3.8 06/01/2012 1008   CL 104 08/05/2012 0949   CL 102 07/06/2012  1122   CL 103 06/01/2012 1008   CO2 23 08/05/2012 0949   CO2 20 07/06/2012 1122   CO2 25 06/01/2012 1008   BUN 14.0 08/05/2012 0949   BUN 8 07/06/2012 1122   BUN 9 06/01/2012 1008   CREATININE 0.7 08/05/2012 0949   CREATININE 0.60 07/06/2012 1122   CREATININE 0.9 06/01/2012 1008      Component Value Date/Time   CALCIUM 9.5 08/05/2012 0949   CALCIUM 9.1 07/06/2012 1122   CALCIUM 9.8 06/01/2012 1008   ALKPHOS 131 06/29/2012 1144   ALKPHOS 69 02/10/2012 1035   ALKPHOS 78 08/14/2010 1251   AST 26 06/29/2012 1144   AST 20 02/10/2012 1035   AST 19 08/14/2010 1251   ALT 68* 06/29/2012 1144   ALT 19 02/10/2012 1035   BILITOT 0.30 06/29/2012 1144   BILITOT 0.4 02/10/2012 1035   BILITOT 0.40 08/14/2010 1251       RADIOGRAPHIC STUDIES: NUCLEAR MEDICINE PET CT SKULL BASE TO THIGH  Technique: Technique: 18.8 mCi F-18 FDG was injected  intravenously. CT data was obtained and used for attenuation  correction and anatomic localization only. (This was not acquired  as a diagnostic CT examination.) Additional exam technical data  entered on technologist worksheet.  Comparison: PET of 02/07/2011. CTs of 06/01/2012.  Findings: Neck: Hypermetabolism within the left side of the  mandible, with suspicion of concurrent periapical focal lucency on  image 24.  Bilateral cervical hypermetabolic lymph nodes. Hypermetabolism  which is primarily felt to be nodal surrounds the right jugular  vein and extends into the thoracic inlet. This measures a S.U.V.  max of 9.9 on image 57  Chest: Hypermetabolic right axillary node. This measures 1.7 cm  and a S.U.V. max of 6.1 on image 82. On the prior PET, this node  measured 1.7 cm and a S.U.V. max of 3.1.  Small prevascular nodes which are hypermetabolic, including on  image 72.  Abdomen/Pelvis: Right adrenal hypermetabolism which is without CT  correlate. Hypermetabolic left external iliac adenopathy. This  measures 1.3 cm and a S.U.V. max of 5.0 on image 181. On the prior    PET, this node measured 1.6 cm and on the order of a S.U.V. max of  2.6.  Skelton: Multifocal marrow hypermetabolism consistent with osseous  involvement. Index lesion in the right humeral head measures a  S.U.V. max of 14.4 on image 53 and is new.  A hypermetabolic lytic lesion within the left iliac wing is new or  progressive and measures a S.U.V. max of 8.1 on image 155.  CT images performed for attenuation correction demonstrate  increased number and size of lymph nodes within the neck. Index  right jugulodigastric node measures 9 mm short axis on image 34  versus 6 mm on the prior PET. Chest, abdomen, and pelvic findings  deferred to recent diagnostic CTs. Trace right-sided pleural fluid  is new. Left upper lobe radiation fibrosis. Punctate left renal  calculi. Cholelithiasis. Fibroid uterus. L4 osseous metastasis  has possible  epidural component on image 144 transverse.  IMPRESSION:  1. Increasing hypermetabolism associated with adenopathy within  the neck, chest, and pelvis, as detailed above. Findings are most  likely indicative of progression of lymphoma.  2. Progressive osseous metastasis, as detailed above. An L4 lesion  has possible epidural component. Consider pre and post contrast  lumbar spine MRI.  3. Hypermetabolism at the right lower neck and thoracic inlet  surrounds the right internal jugular vein. This appears somewhat  hyperattenuating. Cannot exclude thrombus. Consider right upper  extremity venous ultrasound with attention to this area.  4. Likely dental inflammatory left mandibular hypermetabolism.  Consider physical exam correlation.   ASSESSMENT: 77 year old female with  #1Patient with history of low-grade non-Hodgkin lymphoma she initially received CVP Rituxan and then subsequently on maintenance Rituxan.   #2 metastatic breast carcinoma originally diagnosed in 2009 now maintained on letrozole 2.5 mg daily.  #3 patient with recurrent low-grade lymphoma  she will begin Rituxan and treanda.in one month's time.  #4 L4 bone metastasis which is painful. Patient is now status post palliative radiation therapy.  PLAN:  #1 patient will proceed with bendamustine and Rituxan starting on today day 1 of bendamustine with Rituxan and day 2 of bendamustine only. I have dose reduced her to 70 mg per meter squared for now she is heavily pretreated.  #2 patient will also begin Arimidex 1 mg daily.  #3 risks and benefits of treatment were discussed with the patient he clearly. I will plan on seeing her back in one week's time for followup.  All questions were answered. The patient knows to call the clinic with any problems, questions or concerns. We can certainly see the patient much sooner if necessary.  I spent 25 minutes counseling the patient face to face. The total time spent in the appointment was 30 minutes.    Drue Second, MD Medical/Oncology [facility] [phone] (beeper) [phone] (Office)  08/12/2012, 9:51 AM

## 2012-08-12 NOTE — Telephone Encounter (Signed)
Per staff message and POF I have scheduled appts.  JMW  

## 2012-08-12 NOTE — Patient Instructions (Signed)
Patient aware of next appointment; discharged home with no complaints. 

## 2012-08-12 NOTE — Patient Instructions (Addendum)
Proceed with chemotherapy today  We will see you once a week  Call with any problems

## 2012-08-12 NOTE — Telephone Encounter (Signed)
SENT MICHELLE EMAIL TO SET UP PATIENT'S Xgeva q 28 days beginning on 10/18 x 10  ADD ON NP APPOINTMENTS FOR EVERY WEEK PER THE ORDERS ON 08-12-2012  GAVE PATIENT COPY OF THE CALENDAR IN THE TREATMENT ROOM

## 2012-08-13 ENCOUNTER — Ambulatory Visit (HOSPITAL_BASED_OUTPATIENT_CLINIC_OR_DEPARTMENT_OTHER): Payer: Medicare Other

## 2012-08-13 VITALS — BP 105/61 | HR 81 | Temp 97.6°F

## 2012-08-13 DIAGNOSIS — C8590 Non-Hodgkin lymphoma, unspecified, unspecified site: Secondary | ICD-10-CM

## 2012-08-13 DIAGNOSIS — C8589 Other specified types of non-Hodgkin lymphoma, extranodal and solid organ sites: Secondary | ICD-10-CM

## 2012-08-13 DIAGNOSIS — Z5111 Encounter for antineoplastic chemotherapy: Secondary | ICD-10-CM

## 2012-08-13 MED ORDER — ONDANSETRON 8 MG/50ML IVPB (CHCC)
8.0000 mg | Freq: Once | INTRAVENOUS | Status: AC
  Administered 2012-08-13: 8 mg via INTRAVENOUS

## 2012-08-13 MED ORDER — SODIUM CHLORIDE 0.9 % IV SOLN
Freq: Once | INTRAVENOUS | Status: AC
  Administered 2012-08-13: 15:00:00 via INTRAVENOUS

## 2012-08-13 MED ORDER — SODIUM CHLORIDE 0.9 % IV SOLN
70.0000 mg/m2 | Freq: Once | INTRAVENOUS | Status: AC
  Administered 2012-08-13: 115 mg via INTRAVENOUS
  Filled 2012-08-13: qty 23

## 2012-08-13 MED ORDER — HEPARIN SOD (PORK) LOCK FLUSH 100 UNIT/ML IV SOLN
500.0000 [IU] | Freq: Once | INTRAVENOUS | Status: AC | PRN
  Administered 2012-08-13: 500 [IU]
  Filled 2012-08-13: qty 5

## 2012-08-13 MED ORDER — DEXAMETHASONE SODIUM PHOSPHATE 10 MG/ML IJ SOLN
10.0000 mg | Freq: Once | INTRAMUSCULAR | Status: AC
  Administered 2012-08-13: 10 mg via INTRAVENOUS

## 2012-08-13 MED ORDER — SODIUM CHLORIDE 0.9 % IJ SOLN
10.0000 mL | INTRAMUSCULAR | Status: DC | PRN
  Administered 2012-08-13: 10 mL
  Filled 2012-08-13: qty 10

## 2012-08-13 NOTE — Patient Instructions (Addendum)
[facility] Discharge Instructions for Patients Receiving Chemotherapy  Today you received the following chemotherapy agents Treanda To help prevent nausea and vomiting after your treatment, we encourage you to take your nausea medication as directed by MD.   If you develop nausea and vomiting that is not controlled by your nausea medication, call the clinic. If it is after clinic hours your family physician or the after hours number for the clinic or go to the Emergency Department.   BELOW ARE SYMPTOMS THAT SHOULD BE REPORTED IMMEDIATELY:  *FEVER GREATER THAN 100.5 F  *CHILLS WITH OR WITHOUT FEVER  NAUSEA AND VOMITING THAT IS NOT CONTROLLED WITH YOUR NAUSEA MEDICATION  *UNUSUAL SHORTNESS OF BREATH  *UNUSUAL BRUISING OR BLEEDING  TENDERNESS IN MOUTH AND THROAT WITH OR WITHOUT PRESENCE OF ULCERS  *URINARY PROBLEMS  *BOWEL PROBLEMS  UNUSUAL RASH Items with * indicate a potential emergency and should be followed up as soon as possible.  Feel free to call the clinic you have any questions or concerns. The clinic phone number is [phone].   I have been informed and understand all the instructions given to me. I know to contact the clinic, my physician, or go to the Emergency Department if any problems should occur. I do not have any questions at this time, but understand that I may call the clinic during office hours   should I have any questions or need assistance in obtaining follow up care.    __________________________________________  _____________  __________ Signature of Patient or Authorized Representative            Date                   Time    __________________________________________ Nurse's Signature

## 2012-08-14 ENCOUNTER — Ambulatory Visit (HOSPITAL_BASED_OUTPATIENT_CLINIC_OR_DEPARTMENT_OTHER): Payer: Medicare Other

## 2012-08-14 VITALS — BP 162/77 | HR 94 | Temp 97.9°F | Resp 20

## 2012-08-14 DIAGNOSIS — C8590 Non-Hodgkin lymphoma, unspecified, unspecified site: Secondary | ICD-10-CM

## 2012-08-14 DIAGNOSIS — C8589 Other specified types of non-Hodgkin lymphoma, extranodal and solid organ sites: Secondary | ICD-10-CM

## 2012-08-14 MED ORDER — PEGFILGRASTIM INJECTION 6 MG/0.6ML
6.0000 mg | Freq: Once | SUBCUTANEOUS | Status: AC
  Administered 2012-08-14: 6 mg via SUBCUTANEOUS

## 2012-08-18 ENCOUNTER — Encounter: Payer: Self-pay | Admitting: Dietician

## 2012-08-18 NOTE — Progress Notes (Signed)
Brief Out-patient Oncology Nutrition Note  Reason: Patient Screened Positive For Nutrition Risk For Unintentional Weight Loss and Decreased Appetite.   Helen [name] is a 77 year old female patient if Helen [name], diagnosed with lymphoma. Attempted to contact patient via telephone for positive nutrition risk. Left patient voicemail with RD contact information.   RD available for nutrition needs.   Iven Finn, MS, RD, LDN [phone]

## 2012-08-20 ENCOUNTER — Telehealth: Payer: Self-pay | Admitting: *Deleted

## 2012-08-20 ENCOUNTER — Ambulatory Visit: Payer: Medicare Other

## 2012-08-20 ENCOUNTER — Ambulatory Visit (HOSPITAL_BASED_OUTPATIENT_CLINIC_OR_DEPARTMENT_OTHER): Payer: Medicare Other

## 2012-08-20 ENCOUNTER — Ambulatory Visit: Payer: Medicare Other | Admitting: Oncology

## 2012-08-20 ENCOUNTER — Encounter (HOSPITAL_COMMUNITY)
Admission: RE | Admit: 2012-08-20 | Discharge: 2012-08-20 | Disposition: A | Discharge status: Home / Self Care | Payer: Medicare Other | Source: Ambulatory Visit | Attending: Oncology | Admitting: Oncology

## 2012-08-20 ENCOUNTER — Other Ambulatory Visit (HOSPITAL_BASED_OUTPATIENT_CLINIC_OR_DEPARTMENT_OTHER): Payer: Medicare Other | Admitting: Lab

## 2012-08-20 ENCOUNTER — Encounter: Payer: Self-pay | Admitting: Oncology

## 2012-08-20 VITALS — BP 94/45 | HR 83 | Temp 98.5°F | Resp 14

## 2012-08-20 VITALS — BP 118/71 | HR 116 | Temp 98.7°F | Resp 20 | Ht 62.0 in | Wt 131.3 lb

## 2012-08-20 DIAGNOSIS — C8589 Other specified types of non-Hodgkin lymphoma, extranodal and solid organ sites: Secondary | ICD-10-CM

## 2012-08-20 DIAGNOSIS — D649 Anemia, unspecified: Secondary | ICD-10-CM

## 2012-08-20 DIAGNOSIS — C7951 Secondary malignant neoplasm of bone: Secondary | ICD-10-CM

## 2012-08-20 DIAGNOSIS — E860 Dehydration: Secondary | ICD-10-CM

## 2012-08-20 DIAGNOSIS — Z853 Personal history of malignant neoplasm of breast: Secondary | ICD-10-CM

## 2012-08-20 DIAGNOSIS — C50919 Malignant neoplasm of unspecified site of unspecified female breast: Secondary | ICD-10-CM

## 2012-08-20 DIAGNOSIS — R52 Pain, unspecified: Secondary | ICD-10-CM

## 2012-08-20 DIAGNOSIS — C8590 Non-Hodgkin lymphoma, unspecified, unspecified site: Secondary | ICD-10-CM

## 2012-08-20 DIAGNOSIS — C7952 Secondary malignant neoplasm of bone marrow: Secondary | ICD-10-CM

## 2012-08-20 LAB — COMPREHENSIVE METABOLIC PANEL (CC13)
AST: 47 U/L — ABNORMAL HIGH (ref 5–34)
Alkaline Phosphatase: 93 U/L (ref 40–150)
BUN: 5 mg/dL — ABNORMAL LOW (ref 7.0–26.0)
Creatinine: 0.7 mg/dL (ref 0.6–1.1)

## 2012-08-20 LAB — CBC WITH DIFFERENTIAL/PLATELET
Basophils Absolute: 0 10*3/uL (ref 0.0–0.1)
EOS%: 0.2 % (ref 0.0–7.0)
HCT: 29.1 % — ABNORMAL LOW (ref 34.8–46.6)
HGB: 9.1 g/dL — ABNORMAL LOW (ref 11.6–15.9)
LYMPH%: 2.2 % — ABNORMAL LOW (ref 14.0–49.7)
MCH: 25.1 pg (ref 25.1–34.0)
MCV: 80.2 fL (ref 79.5–101.0)
MONO%: 6.2 % (ref 0.0–14.0)
NEUT%: 91.2 % — ABNORMAL HIGH (ref 38.4–76.8)

## 2012-08-20 LAB — HOLD TUBE, BLOOD BANK

## 2012-08-20 MED ORDER — ACETAMINOPHEN 325 MG PO TABS
650.0000 mg | ORAL_TABLET | Freq: Once | ORAL | Status: AC
  Administered 2012-08-20: 650 mg via ORAL

## 2012-08-20 MED ORDER — HEPARIN SOD (PORK) LOCK FLUSH 100 UNIT/ML IV SOLN
500.0000 [IU] | Freq: Every day | INTRAVENOUS | Status: AC | PRN
  Administered 2012-08-20: 500 [IU]
  Filled 2012-08-20: qty 5

## 2012-08-20 MED ORDER — SODIUM CHLORIDE 0.9 % IV SOLN: 250.0000 mL | Freq: Once | INTRAVENOUS | Status: DC

## 2012-08-20 MED ORDER — DENOSUMAB 120 MG/1.7ML ~~LOC~~ SOLN
120.0000 mg | Freq: Once | SUBCUTANEOUS | Status: AC
  Administered 2012-08-20: 120 mg via SUBCUTANEOUS
  Filled 2012-08-20: qty 1.7

## 2012-08-20 MED ORDER — SODIUM CHLORIDE 0.9 % IV SOLN
Freq: Once | INTRAVENOUS | Status: AC
  Administered 2012-08-20: 13:00:00 via INTRAVENOUS

## 2012-08-20 MED ORDER — SODIUM CHLORIDE 0.9 % IJ SOLN
10.0000 mL | INTRAMUSCULAR | Status: AC | PRN
  Administered 2012-08-20: 10 mL
  Filled 2012-08-20: qty 10

## 2012-08-20 MED ORDER — DIPHENHYDRAMINE HCL 25 MG PO CAPS
25.0000 mg | ORAL_CAPSULE | Freq: Once | ORAL | Status: AC
  Administered 2012-08-20: 25 mg via ORAL

## 2012-08-20 MED ORDER — MORPHINE SULFATE 4 MG/ML IJ SOLN
3.0000 mg | Freq: Once | INTRAMUSCULAR | Status: AC
  Administered 2012-08-20: 3 mg via INTRAVENOUS

## 2012-08-20 MED ORDER — OXYCODONE HCL 5 MG PO CAPS: 5.0000 mg | ORAL_CAPSULE | ORAL | Status: DC | PRN

## 2012-08-20 NOTE — Telephone Encounter (Signed)
PER MICHELLE IN THE CHEMO ROOM THEY ARE GOING TO TRY TO DO FLUIDS AND TRANSFUSION ALL TODAY

## 2012-08-20 NOTE — Progress Notes (Signed)
OFFICE PROGRESS NOTE  CC  [name], MD [address] [facility] [address] [place], [address] [place] [address]  DIAGNOSIS: Your-year-old female with:  #1 take left breast carcinoma originally presenting as a fungating mass. Patient underwent neoadjuvant chemotherapy followed by mastectomy. She is currently on letrozole 2.5 mg daily.  #2 low-grade non-Hodgkin lymphoma status post CVP and Rituxan now receiving maintenance Rituxan.  PRIOR THERAPY:  #1 the patient was diagnosed with metastatic breast carcinoma beginning October 2009. She returned her 1 chemotherapy followed by mastectomy radiation. She has been on letrozole 2.5 mg daily tolerating it well.  #2 Was diagnosed with low-grade non-Hodgkin lymphoma  12. She received 6 cycles of CVP and Rituxan. Last week for maintenance Rituxan. She is to receive 4 weeks in a row every 6 months for a total of 2 years.  CURRENT THERAPY:  Status post cycle #1 bendamustine and Rituxan  INTERVAL HISTORY: Helen [name] 77 y.o. female returns for Visit today. This is one week after she received her cycle 1 treanda and Rituxan. Unfortunately she seems to be very dehydrated she has not been drinking match. Her hemoglobin also has significantly dropped.She is very dizzy feels very fatigued and has a very dry mouth. She has not had any fevers. She is very weak not very ambulatory and it is difficult to get her on the examination table. She however does not have any thrush no mucositis no nausea or vomiting no fevers no bleeding. Remainder of the 10 point review of systems is negative. Of note patient did come by herself without any family  MEDICAL HISTORY: Past Medical History  Diagnosis Date  . Arthritis   . Blood transfusion 2009  . Breast CA 08/2008    (LT) breast ca dx 10/09/Chemo  . Lymphoma 09/09/2011    NHL  . Leukemia, acute, in remission 2012    Pt. not sure of type  . Hypertension   . Seizures 1980's    from medication reaction/Pt.  [name] Hx  of radiation therapy 09/11/08 -10/31/08    left breast  . Metastasis to bone 07/03/2012    MRI L spine    ALLERGIES:  is allergic to codeine; penicillins; sulfonamide derivatives; tape; and tramadol hcl.  MEDICATIONS:  Current Outpatient Prescriptions  Medication Sig Dispense Refill  . anastrozole (ARIMIDEX) 1 MG tablet Take 1 tablet (1 mg total) by mouth daily.  30 tablet  6  . oxycodone (OXY-IR) 5 MG capsule Take 1 capsule (5 mg total) by mouth every 4 (four) hours as needed.  90 capsule  0    SURGICAL HISTORY:  Past Surgical History  Procedure Date  . Mastectomy   . Thyroidectomy 1965  . Appendectomy   . Tonsillectomy     REVIEW OF SYSTEMS:  Pertinent items are noted in HPI.   PHYSICAL EXAMINATION:  Fatigued-appearing female disheveled tired HEENT exam EOMI Perla oral mucosa is dry no mucositis no thrush neck is supple no palpable adenopathy lungs distant breath sounds cardiovascular tachycardia but regular rate rhythm abdomen soft nontender nondistended bowel sounds are present no HSM extremities no edema neuro alert oriented otherwise nonfocal ECOG PERFORMANCE STATUS: 1 - Symptomatic but completely ambulatory  Blood pressure 118/71, pulse 116, temperature 98.7 F (37.1 C), temperature source Oral, resp. rate 20, height 5\' 2"  (1.575 m), weight 131 lb 4.8 oz (59.557 kg).  LABORATORY DATA: Lab Results  Component Value Date   WBC 18.8* 08/20/2012   HGB 9.1* 08/20/2012   HCT 29.1* 08/20/2012  MCV 80.2 08/20/2012   PLT 366 Giant platelets present 08/20/2012      Chemistry      Component Value Date/Time   NA 135* 08/12/2012 0902   NA 136 07/06/2012 1122   NA 142 06/01/2012 1008   K 3.9 08/12/2012 0902   K 3.8 07/06/2012 1122   K 3.8 06/01/2012 1008   CL 100 08/12/2012 0902   CL 102 07/06/2012 1122   CL 103 06/01/2012 1008   CO2 21* 08/12/2012 0902   CO2 20 07/06/2012 1122   CO2 25 06/01/2012 1008   BUN 7.0 08/12/2012 0902   BUN 8 07/06/2012 1122   BUN 9 06/01/2012 1008    CREATININE 0.7 08/12/2012 0902   CREATININE 0.60 07/06/2012 1122   CREATININE 0.9 06/01/2012 1008      Component Value Date/Time   CALCIUM 9.3 08/12/2012 0902   CALCIUM 9.1 07/06/2012 1122   CALCIUM 9.8 06/01/2012 1008   ALKPHOS 103 08/12/2012 0902   ALKPHOS 69 02/10/2012 1035   ALKPHOS 78 08/14/2010 1251   AST 45* 08/12/2012 0902   AST 20 02/10/2012 1035   AST 19 08/14/2010 1251   ALT 89* 08/12/2012 0902   ALT 19 02/10/2012 1035   BILITOT 0.40 08/12/2012 0902   BILITOT 0.4 02/10/2012 1035   BILITOT 0.40 08/14/2010 1251       RADIOGRAPHIC STUDIES: NUCLEAR MEDICINE PET CT SKULL BASE TO THIGH  Technique: Technique: 18.8 mCi F-18 FDG was injected  intravenously. CT data was obtained and used for attenuation  correction and anatomic localization only. (This was not acquired  as a diagnostic CT examination.) Additional exam technical data  entered on technologist worksheet.  Comparison: PET of 02/07/2011. CTs of 06/01/2012.  Findings: Neck: Hypermetabolism within the left side of the  mandible, with suspicion of concurrent periapical focal lucency on  image 24.  Bilateral cervical hypermetabolic lymph nodes. Hypermetabolism  which is primarily felt to be nodal surrounds the right jugular  vein and extends into the thoracic inlet. This measures a S.U.V.  max of 9.9 on image 57  Chest: Hypermetabolic right axillary node. This measures 1.7 cm  and a S.U.V. max of 6.1 on image 82. On the prior PET, this node  measured 1.7 cm and a S.U.V. max of 3.1.  Small prevascular nodes which are hypermetabolic, including on  image 72.  Abdomen/Pelvis: Right adrenal hypermetabolism which is without CT  correlate. Hypermetabolic left external iliac adenopathy. This  measures 1.3 cm and a S.U.V. max of 5.0 on image 181. On the prior  PET, this node measured 1.6 cm and on the order of a S.U.V. max of  2.6.  Skelton: Multifocal marrow hypermetabolism consistent with osseous  involvement. Index lesion in the  right humeral head measures a  S.U.V. max of 14.4 on image 53 and is new.  A hypermetabolic lytic lesion within the left iliac wing is new or  progressive and measures a S.U.V. max of 8.1 on image 155.  CT images performed for attenuation correction demonstrate  increased number and size of lymph nodes within the neck. Index  right jugulodigastric node measures 9 mm short axis on image 34  versus 6 mm on the prior PET. Chest, abdomen, and pelvic findings  deferred to recent diagnostic CTs. Trace right-sided pleural fluid  is new. Left upper lobe radiation fibrosis. Punctate left renal  calculi. Cholelithiasis. Fibroid uterus. L4 osseous metastasis  has possible epidural component on image 144 transverse.  IMPRESSION:  1. Increasing hypermetabolism associated  with adenopathy within  the neck, chest, and pelvis, as detailed above. Findings are most  likely indicative of progression of lymphoma.  2. Progressive osseous metastasis, as detailed above. An L4 lesion  has possible epidural component. Consider pre and post contrast  lumbar spine MRI.  3. Hypermetabolism at the right lower neck and thoracic inlet  surrounds the right internal jugular vein. This appears somewhat  hyperattenuating. Cannot exclude thrombus. Consider right upper  extremity venous ultrasound with attention to this area.  4. Likely dental inflammatory left mandibular hypermetabolism.  Consider physical exam correlation.   ASSESSMENT: 77 year old female with  #1Patient with history of low-grade non-Hodgkin lymphoma she initially received CVP Rituxan and then subsequently on maintenance Rituxan.   #2 metastatic breast carcinoma originally diagnosed in 2009 now maintained on letrozole 2.5 mg daily.  #3 patient with recurrent low-grade lymphoma she will begin Rituxan and treanda.in one month's time.  #4 L4 bone metastasis which is painful. Patient is now status post palliative radiation therapy.  #5 dehydration and  severe anemia secondary to chemotherapy  PLAN:  #1 patient will receive IV fluids for her dehydration.  #2 she will also receive blood transfusion 2 units on 1019 for severe anemia due to chemotherapy.  #3 she will be seen back in  one week for followup  All questions were answered. The patient knows to call the clinic with any problems, questions or concerns. We can certainly see the patient much sooner if necessary.  I spent 25 minutes counseling the patient face to face. The total time spent in the appointment was 30 minutes.    Drue Second, MD Medical/Oncology [facility] [phone] (beeper) [phone] (Office)  08/20/2012, 11:51 AM

## 2012-08-20 NOTE — Patient Instructions (Addendum)
Denosumab injection What is this medicine? DENOSUMAB slows bone breakdown. It is used to treat osteoporosis in women after menopause. This medicine is also used to prevent bone fractures and other bone problems caused by cancer bone metastases. This medicine may be used for other purposes; ask your health care provider or pharmacist if you have questions. What should I tell my health care provider before I take this medicine? They need to know if you have any of these conditions: -dental disease -eczema -infection or history of infections -kidney disease or on dialysis -low blood calcium or vitamin D -malabsorption syndrome -scheduled to have surgery or tooth extraction -taking medicine that contains denosumab -thyroid or parathyroid disease -an unusual reaction to denosumab, other medicines, foods, dyes, or preservatives -pregnant or trying to get pregnant -breast-feeding How should I use this medicine? This medicine is for injection under the skin. It is given by a health care professional in a hospital or clinic setting. If you are getting Prolia, a special MedGuide will be given to you by the pharmacist with each prescription and refill. Be sure to read this information carefully each time. Talk to your pediatrician regarding the use of this medicine in children. Special care may be needed. Overdosage: If you think you've taken too much of this medicine contact a poison control center or emergency room at once. Overdosage: If you think you have taken too much of this medicine contact a poison control center or emergency room at once. NOTE: This medicine is only for you. Do not share this medicine with others. What if I miss a dose? It is important not to miss your dose. Call your doctor or health care professional if you are unable to keep an appointment. What may interact with this medicine? Do not take this medicine with any of the following medications: -other medicines containing  denosumab This medicine may also interact with the following medications: -medicines that suppress the immune system -medicines that treat cancer -steroid medicines like prednisone or cortisone This list may not describe all possible interactions. Give your health care provider a list of all the medicines, herbs, non-prescription drugs, or dietary supplements you use. Also tell them if you smoke, drink alcohol, or use illegal drugs. Some items may interact with your medicine. What should I watch for while using this medicine? Visit your doctor or health care professional for regular checks on your progress. Your doctor or health care professional may order blood tests and other tests to see how you are doing. Call your doctor or health care professional if you get a cold or other infection while receiving this medicine. Do not treat yourself. This medicine may decrease your body's ability to fight infection. You should make sure you get enough calcium and vitamin D while you are taking this medicine, unless your doctor tells you not to. Discuss the foods you eat and the vitamins you take with your health care professional. See your dentist regularly. Brush and floss your teeth as directed. Before you have any dental work done, tell your dentist you are receiving this medicine. What side effects may I notice from receiving this medicine? Side effects that you should report to your doctor or health care professional as soon as possible: -allergic reactions like skin rash, itching or hives, swelling of the face, lips, or tongue -breathing problems -chest pain -fast, irregular heartbeat -feeling faint or lightheaded, falls -fever, chills, or any other sign of infection -muscle spasms, tightening, or twitches -numbness or tingling -skin   blisters or bumps, or is dry, peels, or red -slow healing or unexplained pain in the mouth or jaw -unusual bleeding or bruising Side effects that usually do not  require medical attention (Report these to your doctor or health care professional if they continue or are bothersome.): -muscle pain -stomach upset, gas This list may not describe all possible side effects. Call your doctor for medical advice about side effects. You may report side effects to FDA at 1-800-FDA-1088. Where should I keep my medicine? This medicine is only given in a clinic, doctor's office, or other health care setting and will not be stored at home. NOTE: This sheet is a summary. It may not cover all possible information. If you have questions about this medicine, talk to your doctor, pharmacist, or health care provider.  2012, [employer]. (06/12/2010 2:32:46 PM) 

## 2012-08-20 NOTE — Telephone Encounter (Signed)
Left message with son that pt is getting 1 of 2  units of blood today . She will need to come back tomorrow for the 2nd unit. I told him to please call back to confirm

## 2012-08-20 NOTE — Patient Instructions (Addendum)
IVF todays for dehydration  Blood transfusion on 10/19 for severe anemia due to the chemotherapy

## 2012-08-21 ENCOUNTER — Ambulatory Visit (HOSPITAL_BASED_OUTPATIENT_CLINIC_OR_DEPARTMENT_OTHER): Payer: Medicare Other

## 2012-08-21 VITALS — BP 114/58 | HR 92 | Temp 98.3°F | Resp 20

## 2012-08-21 DIAGNOSIS — D649 Anemia, unspecified: Secondary | ICD-10-CM

## 2012-08-21 MED ORDER — SODIUM CHLORIDE 0.9 % IV SOLN
250.0000 mL | Freq: Once | INTRAVENOUS | Status: AC
  Administered 2012-08-21: 250 mL via INTRAVENOUS

## 2012-08-21 MED ORDER — ACETAMINOPHEN 325 MG PO TABS
650.0000 mg | ORAL_TABLET | Freq: Once | ORAL | Status: AC
  Administered 2012-08-21: 650 mg via ORAL

## 2012-08-21 MED ORDER — HEPARIN SOD (PORK) LOCK FLUSH 100 UNIT/ML IV SOLN
500.0000 [IU] | Freq: Every day | INTRAVENOUS | Status: AC | PRN
Start: 2012-08-21 — End: 2012-08-21
  Administered 2012-08-21: 500 [IU]
  Filled 2012-08-21: qty 5

## 2012-08-21 MED ORDER — SODIUM CHLORIDE 0.9 % IJ SOLN
10.0000 mL | INTRAMUSCULAR | Status: AC | PRN
  Administered 2012-08-21: 10 mL
  Filled 2012-08-21: qty 10

## 2012-08-21 MED ORDER — DIPHENHYDRAMINE HCL 25 MG PO CAPS
25.0000 mg | ORAL_CAPSULE | Freq: Once | ORAL | Status: AC
  Administered 2012-08-21: 25 mg via ORAL

## 2012-08-21 NOTE — Patient Instructions (Signed)
Patient aware of next appointment; discharged home via ' [employer]' transportation; no complaints at discharge

## 2012-08-22 LAB — TYPE AND SCREEN
ABO/RH(D): O POS
Antibody Screen: NEGATIVE
Unit division: 0

## 2012-08-26 ENCOUNTER — Telehealth: Payer: Self-pay | Admitting: *Deleted

## 2012-08-26 ENCOUNTER — Other Ambulatory Visit (HOSPITAL_BASED_OUTPATIENT_CLINIC_OR_DEPARTMENT_OTHER): Payer: Medicare Other | Admitting: Lab

## 2012-08-26 ENCOUNTER — Ambulatory Visit (HOSPITAL_BASED_OUTPATIENT_CLINIC_OR_DEPARTMENT_OTHER): Payer: Medicare Other | Admitting: Adult Health

## 2012-08-26 ENCOUNTER — Ambulatory Visit
Admission: RE | Admit: 2012-08-26 | Discharge: 2012-08-26 | Disposition: A | Discharge status: Home / Self Care | Payer: Medicare Other | Source: Ambulatory Visit | Attending: Radiation Oncology | Admitting: Radiation Oncology

## 2012-08-26 ENCOUNTER — Encounter: Payer: Self-pay | Admitting: Adult Health

## 2012-08-26 ENCOUNTER — Encounter: Payer: Self-pay | Admitting: Radiation Oncology

## 2012-08-26 VITALS — BP 119/74 | HR 94 | Temp 98.0°F | Resp 20 | Ht 62.0 in | Wt 134.1 lb

## 2012-08-26 VITALS — BP 130/75 | HR 83 | Temp 98.1°F | Resp 20 | Ht 62.0 in | Wt 134.1 lb

## 2012-08-26 DIAGNOSIS — C8590 Non-Hodgkin lymphoma, unspecified, unspecified site: Secondary | ICD-10-CM

## 2012-08-26 DIAGNOSIS — C50919 Malignant neoplasm of unspecified site of unspecified female breast: Secondary | ICD-10-CM

## 2012-08-26 DIAGNOSIS — Z853 Personal history of malignant neoplasm of breast: Secondary | ICD-10-CM

## 2012-08-26 DIAGNOSIS — C8588 Other specified types of non-Hodgkin lymphoma, lymph nodes of multiple sites: Secondary | ICD-10-CM

## 2012-08-26 DIAGNOSIS — C7952 Secondary malignant neoplasm of bone marrow: Secondary | ICD-10-CM

## 2012-08-26 DIAGNOSIS — C7951 Secondary malignant neoplasm of bone: Secondary | ICD-10-CM

## 2012-08-26 DIAGNOSIS — R3911 Hesitancy of micturition: Secondary | ICD-10-CM

## 2012-08-26 DIAGNOSIS — C801 Malignant (primary) neoplasm, unspecified: Secondary | ICD-10-CM

## 2012-08-26 DIAGNOSIS — C8589 Other specified types of non-Hodgkin lymphoma, extranodal and solid organ sites: Secondary | ICD-10-CM

## 2012-08-26 DIAGNOSIS — N390 Urinary tract infection, site not specified: Secondary | ICD-10-CM

## 2012-08-26 LAB — CBC WITH DIFFERENTIAL/PLATELET
Basophils Absolute: 0 10*3/uL (ref 0.0–0.1)
EOS%: 0.2 % (ref 0.0–7.0)
Eosinophils Absolute: 0 10*3/uL (ref 0.0–0.5)
HCT: 33.1 % — ABNORMAL LOW (ref 34.8–46.6)
HGB: 11.1 g/dL — ABNORMAL LOW (ref 11.6–15.9)
MCH: 27.2 pg (ref 25.1–34.0)
MCV: 81.1 fL (ref 79.5–101.0)
MONO%: 7.4 % (ref 0.0–14.0)
NEUT#: 9.3 10*3/uL — ABNORMAL HIGH (ref 1.5–6.5)
NEUT%: 85.9 % — ABNORMAL HIGH (ref 38.4–76.8)
Platelets: 342 10*3/uL (ref 145–400)

## 2012-08-26 LAB — URINALYSIS, MICROSCOPIC - CHCC
Bilirubin (Urine): NEGATIVE
Ketones: NEGATIVE mg/dL
Protein: NEGATIVE mg/dL
RBC / HPF: NEGATIVE (ref 0–2)
Specific Gravity, Urine: 1.005 (ref 1.003–1.035)
pH: 6.5 (ref 4.6–8.0)

## 2012-08-26 LAB — COMPREHENSIVE METABOLIC PANEL (CC13)
AST: 39 U/L — ABNORMAL HIGH (ref 5–34)
Albumin: 2.6 g/dL — ABNORMAL LOW (ref 3.5–5.0)
Alkaline Phosphatase: 104 U/L (ref 40–150)
BUN: 5 mg/dL — ABNORMAL LOW (ref 7.0–26.0)
Calcium: 8.2 mg/dL — ABNORMAL LOW (ref 8.4–10.4)
Creatinine: 0.5 mg/dL — ABNORMAL LOW (ref 0.6–1.1)
Glucose: 141 mg/dl — ABNORMAL HIGH (ref 70–99)

## 2012-08-26 MED ORDER — POTASSIUM CHLORIDE CRYS ER 20 MEQ PO TBCR: 20.0000 meq | EXTENDED_RELEASE_TABLET | Freq: Every day | ORAL | Status: DC

## 2012-08-26 NOTE — Progress Notes (Signed)
Patient here  Walking with cane, slow steady gait, , here f/u s/p rad tx L-4, metastatic/progressive lymphoma hx breast cancer Alert,oriented x3, on Arimidex 1mg  daily po,  C/o pain now at a 5/10 scale, seen primary Md office saw Nurse Practinoner today Arsenio Katz Takes Oxycodone po prn pain.,

## 2012-08-26 NOTE — Patient Instructions (Addendum)
Doing well.  Lab work looks good.  You can take Senokot-S two tablets daily for constipation.  If that does not work, you can take two tablets twice a day.  Also, you can take Miralax daily as needed.  Continue your Arimidex.  We will see you back on 11/7 for your next cycle of chemotherapy.

## 2012-08-26 NOTE — Progress Notes (Signed)
OFFICE PROGRESS NOTE  CC  [name], MD [address] [facility] [address] [place], [address] [place] [address]  DIAGNOSIS: 77 year old female with:  #1 left breast carcinoma originally presenting as a fungating mass. Patient underwent neoadjuvant chemotherapy followed by mastectomy. She is currently on Arimidex 1mg  daily.   #2 low-grade non-Hodgkin lymphoma status post CVP and Rituxan now receiving R-Bendamustine.   PRIOR THERAPY:  #1 the patient was diagnosed with metastatic breast carcinoma beginning October 2009. She returned her 1 chemotherapy followed by mastectomy radiation. She was on letrozole 2.5 mg daily, but has changed to Arimidex 1mg  daily.    #2 Was diagnosed with low-grade non-Hodgkin lymphoma  12. She received 6 cycles of CVP and Rituxan, followed by maintenance Rituxan, who recurred and is now on R-Bendamustine.    CURRENT THERAPY:  radiation oncology for palliative radiation therapy to the lumbar spine. R-Bendamustine c 1 day 8 with Neulasta support  INTERVAL HISTORY: Helen [name] 77 y.o. female returns for Visit today.  She was started on R-Bendamustine last week for her lymphoma.  She did receive Neulasta support.  She did well with this.  She is constipated, hasn't had a BM in a couple of days, and she also endorses hot flashes.  She is also reporting urinary hesitancy at the end of her urinary stream.  She has had no nausea, vomiting, vaginal dryness, dysuria, or pain.    MEDICAL HISTORY: Past Medical History  Diagnosis Date  . Arthritis   . Blood transfusion 2009  . Breast CA 08/2008    (LT) breast ca dx 10/09/Chemo  . Lymphoma 09/09/2011    NHL  . Leukemia, acute, in remission 2012    Pt. not sure of type  . Hypertension   . Seizures 1980's    from medication reaction/Pt.  [name] Hx of radiation therapy 09/11/08 -10/31/08    left breast  . Metastasis to bone 07/03/2012    MRI L spine  . History of radiation therapy eot 07/30/12    lumbar spine L4 /l shoulder     ALLERGIES:  is allergic to codeine; penicillins; sulfonamide derivatives; tape; and tramadol hcl.  MEDICATIONS:  Current Outpatient Prescriptions  Medication Sig Dispense Refill  . anastrozole (ARIMIDEX) 1 MG tablet Take 1 tablet (1 mg total) by mouth daily.  30 tablet  6  . oxycodone (OXY-IR) 5 MG capsule Take 1 capsule (5 mg total) by mouth every 4 (four) hours as needed.  90 capsule  0    SURGICAL HISTORY:  Past Surgical History  Procedure Date  . Mastectomy   . Thyroidectomy 1965  . Appendectomy   . Tonsillectomy     REVIEW OF SYSTEMS:  General: fatigue (-), night sweats (-), fever (-), pain (-) Lymph: palpable nodes (-) HEENT: vision changes (-), mucositis (-), gum bleeding (-), epistaxis (-) Cardiovascular: chest pain (-), palpitations (-) Pulmonary: shortness of breath (-), dyspnea on exertion (-), cough (-), hemoptysis (-) GI:  Early satiety (-), melena (-), dysphagia (-), nausea/vomiting (-), diarrhea (-) GU: dysuria (-), hematuria (-), incontinence (-) Musculoskeletal: joint swelling (-), joint pain (-), back pain (-) Neuro: weakness (-), numbness (-), headache (-), confusion (-) Skin: Rash (-), lesions (-), dryness (-) Psych: depression (-), suicidal/homicidal ideation (-), feeling of hopelessness (-)   PHYSICAL EXAMINATION:  BP 119/74  Pulse 94  Temp 98 F (36.7 C) (Oral)  Resp 20  Ht 5\' 2"  (1.575 m)  Wt 134 lb 1.6 oz (60.827 kg)  BMI  24.53 kg/m2 General: Patient is a well appearing female in no acute distress HEENT: PERRLA, sclerae anicteric no conjunctival pallor, MMM Neck: supple, no palpable adenopathy Lungs: clear to auscultation bilaterally, no wheezes, rhonchi, or rales Cardiovascular: regular rate rhythm, S1, S2, no murmurs, rubs or gallops Abdomen: Soft, non-tender, non-distended, normoactive bowel sounds, no HSM Extremities: warm and well perfused, no clubbing, cyanosis, or edema Skin: No rashes or lesions Neuro: Non-focal ECOG PERFORMANCE  STATUS: 1 - Symptomatic but completely ambulatory  LABORATORY DATA: Lab Results  Component Value Date   WBC 10.8* 08/26/2012   HGB 11.1* 08/26/2012   HCT 33.1* 08/26/2012   MCV 81.1 08/26/2012   PLT 342 08/26/2012      Chemistry      Component Value Date/Time   NA 136 08/26/2012 0933   NA 136 07/06/2012 1122   NA 142 06/01/2012 1008   K 3.0* 08/26/2012 0933   K 3.8 07/06/2012 1122   K 3.8 06/01/2012 1008   CL 103 08/26/2012 0933   CL 102 07/06/2012 1122   CL 103 06/01/2012 1008   CO2 22 08/26/2012 0933   CO2 20 07/06/2012 1122   CO2 25 06/01/2012 1008   BUN 5.0* 08/26/2012 0933   BUN 8 07/06/2012 1122   BUN 9 06/01/2012 1008   CREATININE 0.5* 08/26/2012 0933   CREATININE 0.60 07/06/2012 1122   CREATININE 0.9 06/01/2012 1008      Component Value Date/Time   CALCIUM 8.2* 08/26/2012 0933   CALCIUM 9.1 07/06/2012 1122   CALCIUM 9.8 06/01/2012 1008   ALKPHOS 104 08/26/2012 0933   ALKPHOS 69 02/10/2012 1035   ALKPHOS 78 08/14/2010 1251   AST 39* 08/26/2012 0933   AST 20 02/10/2012 1035   AST 19 08/14/2010 1251   ALT 58* 08/26/2012 0933   ALT 19 02/10/2012 1035   BILITOT 0.30 08/26/2012 0933   BILITOT 0.4 02/10/2012 1035   BILITOT 0.40 08/14/2010 1251       RADIOGRAPHIC STUDIES: NUCLEAR MEDICINE PET CT SKULL BASE TO THIGH  Technique: Technique: 18.8 mCi F-18 FDG was injected  intravenously. CT data was obtained and used for attenuation  correction and anatomic localization only. (This was not acquired  as a diagnostic CT examination.) Additional exam technical data  entered on technologist worksheet.  Comparison: PET of 02/07/2011. CTs of 06/01/2012.  Findings: Neck: Hypermetabolism within the left side of the  mandible, with suspicion of concurrent periapical focal lucency on  image 24.  Bilateral cervical hypermetabolic lymph nodes. Hypermetabolism  which is primarily felt to be nodal surrounds the right jugular  vein and extends into the thoracic inlet. This measures a S.U.V.  max  of 9.9 on image 57  Chest: Hypermetabolic right axillary node. This measures 1.7 cm  and a S.U.V. max of 6.1 on image 82. On the prior PET, this node  measured 1.7 cm and a S.U.V. max of 3.1.  Small prevascular nodes which are hypermetabolic, including on  image 72.  Abdomen/Pelvis: Right adrenal hypermetabolism which is without CT  correlate. Hypermetabolic left external iliac adenopathy. This  measures 1.3 cm and a S.U.V. max of 5.0 on image 181. On the prior  PET, this node measured 1.6 cm and on the order of a S.U.V. max of  2.6.  [place]: Multifocal marrow hypermetabolism consistent with osseous  involvement. Index lesion in the right humeral head measures a  S.U.V. max of 14.4 on image 53 and is new.  A hypermetabolic lytic lesion within the left iliac wing  is new or  progressive and measures a S.U.V. max of 8.1 on image 155.  CT images performed for attenuation correction demonstrate  increased number and size of lymph nodes within the neck. Index  right jugulodigastric node measures 9 mm short axis on image 34  versus 6 mm on the prior PET. Chest, abdomen, and pelvic findings  deferred to recent diagnostic CTs. Trace right-sided pleural fluid  is new. Left upper lobe radiation fibrosis. Punctate left renal  calculi. Cholelithiasis. Fibroid uterus. L4 osseous metastasis  has possible epidural component on image 144 transverse.  IMPRESSION:  1. Increasing hypermetabolism associated with adenopathy within  the neck, chest, and pelvis, as detailed above. Findings are most  likely indicative of progression of lymphoma.  2. Progressive osseous metastasis, as detailed above. An L4 lesion  has possible epidural component. Consider pre and post contrast  lumbar spine MRI.  3. Hypermetabolism at the right lower neck and thoracic inlet  surrounds the right internal jugular vein. This appears somewhat  hyperattenuating. Cannot exclude thrombus. Consider right upper  extremity venous  ultrasound with attention to this area.  4. Likely dental inflammatory left mandibular hypermetabolism.  Consider physical exam correlation.   ASSESSMENT: 77 year old female with  #1Patient with history of low-grade non-Hodgkin lymphoma she initially received CVP Rituxan and then subsequently on maintenance Rituxan, now on Arimidex daily.    #2 metastatic breast carcinoma originally diagnosed in 2009 originally on letrozole 2.5 mg daily, now on Arimidex.  #3 patient with recurrent low-grade lymphoma she will begin Rituxan- Bendamustine.  #4 L4 bone metastasis which is painful. Patient is now status post palliative radiation therapy.  #5 constipation  PLAN:  #1 Ms. Feider did well with her chemotherapy.  Her counts are stable, and she is essentially well.    #2 She is taking her Arimidex and tolerating it pretty well.  She does have hot flashes that she says are tolerable.    #3 I gave her instructions for her constipation and OTC meds she could use.  I sent a urinalysis for her hesitancy,  It was negative.    #4  She is doing well.  She will return in two weeks for cycle 2 of R Bendamustine.    All questions were answered. The patient knows to call the clinic with any problems, questions or concerns. We can certainly see the patient much sooner if necessary.  I spent 25 minutes counseling the patient face to face. The total time spent in the appointment was 30 minutes.  This case was reviewed with Dr. Welton Flakes.    Cherie Ouch Lyn Hollingshead, NP Medical Oncology [facility] Phone: [phone]   08/26/2012, 3:56 PM

## 2012-08-26 NOTE — Telephone Encounter (Signed)
Message copied by Cooper Render on Thu Aug 26, 2012  4:41 PM ------      Message from: Laural Golden      Created: Thu Aug 26, 2012  3:43 PM      Regarding: meds       Please call patient and have take KDur 20 meq daily x 10 days.  Thanks,  L            ----- Message -----         From: [facility] Interface         Sent: 08/26/2012   9:52 AM           To: Victorino December, MD

## 2012-08-27 NOTE — Progress Notes (Signed)
  Radiation Oncology         (336) [phone] ________________________________  Name: Helen [name] MRN: [id number]  Date: 08/26/2012  DOB: 12-29-1934  Follow-Up Visit Note  CC: [name], MD  [name], MD  Diagnosis:   The patient has a history of an advanced left-sided breast cancer. She also has a more recent diagnosis of non-Hodgkin's lymphoma.  Interval Since Last Radiation:  One month to the lumbar spine.   Narrative:  The patient returns today for routine follow-up.  She indicates that she has done relatively well over the last month. She notes that her pain is much improved in the lower back and therefore she appears to have had a very nice response. We also treated the right shoulder and she states that this pain has significantly improved as well. She states that she really forgot about pain in this area. She was seen by medical oncology today and is proceeding with systemic treatment in terms of both anti-hormonal treatment for her breast cancer as well as additional systemic treatment for her lymphoma.                              ALLERGIES:  is allergic to codeine; penicillins; sulfonamide derivatives; tape; and tramadol hcl.  Meds: Current Outpatient Prescriptions  Medication Sig Dispense Refill  . anastrozole (ARIMIDEX) 1 MG tablet Take 1 tablet (1 mg total) by mouth daily.  30 tablet  6  . oxycodone (OXY-IR) 5 MG capsule Take 1 capsule (5 mg total) by mouth every 4 (four) hours as needed.  90 capsule  0  . potassium chloride SA (K-DUR,KLOR-CON) 20 MEQ tablet Take 1 tablet (20 mEq total) by mouth daily.  10 tablet  0    Physical Findings: The patient is in no acute distress. Patient is alert and oriented.  height is 5\' 2"  (1.575 m) and weight is 134 lb 1.6 oz (60.827 kg). Her oral temperature is 98.1 F (36.7 C). Her blood pressure is 130/75 and her pulse is 83. Her respiration is 20. .     Lab Findings: Lab Results  Component Value Date   WBC 10.8* 08/26/2012    HGB 11.1* 08/26/2012   HCT 33.1* 08/26/2012   MCV 81.1 08/26/2012   PLT 342 08/26/2012     Radiographic Findings: No results found.  Impression:    The patient is a 77 year old female who is now one month out from a palliative course of treatment to the L. spine. She has had a significant improvement in her pain. No ongoing issues in terms of acute toxicity.  Plan:  The patient will return to our clinic on a when necessary basis. She is continuing on systemic treatment with Dr. Park Breed.   Radene Gunning, M.D., Ph.D.

## 2012-08-30 ENCOUNTER — Telehealth: Payer: Self-pay | Admitting: *Deleted

## 2012-08-30 NOTE — Telephone Encounter (Signed)
Pt called states "I took the potassium pill and it stayed on my tongue about 2 days because I couldn't get it down then I finally got it down and my stool is black. Would it make my stool black?" Pt denied fever, bloody stools, "coffe ground look" to stools, pain to abdomen, fever, chills, dizziness. Pt advised she is taking 2 senokot-s daily and recently had some spinach. Advised pt to continue to monitor overnight if worsens go to ED. Pt verbalized understanding. Will review with provider

## 2012-08-30 NOTE — Telephone Encounter (Signed)
Called pt, requested she come in 10/29 pt states" I dont have any transportation to get there. I can come in on 11/, that's when the Zenaida Niece comes bBut everybody here is working or in school. I'm not coming in" Will review with provider for additional instructions.

## 2012-08-30 NOTE — Telephone Encounter (Signed)
Please have patient see Mardella Layman on 10/29 with cbc, BMET

## 2012-08-31 ENCOUNTER — Telehealth: Payer: Self-pay | Admitting: *Deleted

## 2012-08-31 MED ORDER — OMEPRAZOLE 40 MG PO CPDR: 40.0000 mg | DELAYED_RELEASE_CAPSULE | Freq: Every day | ORAL | Status: DC

## 2012-08-31 NOTE — Progress Notes (Signed)
  Radiation Oncology         (336) [phone] ________________________________  Name: Helen [name] MRN: [id number]  Date: 07/30/2012  DOB: [birth year]  End of Treatment Note  Diagnosis:   Lymphoma     Indication for treatment:  Palliative       Radiation treatment dates:   07/19/2012 through 07/30/2012  Site/dose:   The patient was treated to 2 distinct areas of disease involving the L. spine as well as the right shoulder. To both sites, she received 25 gray in 10 fractions at 2.5 gray per fraction.  Narrative: The patient tolerated radiation treatment relatively well.   The patient's pain did improve somewhat at both sites during her treatment. She did not exhibit any difficulties with significant acute toxicity.  Plan: The patient has completed radiation treatment. The patient will return to radiation oncology clinic for routine followup in one month. I advised the patient to call or return sooner if they have any questions or concerns related to their recovery or treatment. ________________________________  Radene Gunning, M.D., Ph.D.

## 2012-08-31 NOTE — Telephone Encounter (Signed)
Per NP, notified pt to begin taking Prilosec 40mg  daily. Pt verbalized understanding. Pt advised she's doing better.

## 2012-08-31 NOTE — Telephone Encounter (Signed)
Please prescribe Omeprazole 40mg  PO daily.

## 2012-08-31 NOTE — Progress Notes (Signed)
  Radiation Oncology         ([phone] ________________________________  Name: Helen [name] MRN: [id number]  Date: 07/16/2012  DOB: Nov 23, 1934  Simulation Verification Note   NARRATIVE: The patient was brought to the treatment unit and placed in the planned treatment position. The clinical setup was verified. Then port films were obtained and uploaded to the radiation oncology medical record software.  The treatment beams were carefully compared against the planned radiation fields. The position, location, and shape of the radiation fields was reviewed. The targeted volume of tissue appears to be appropriately covered by the radiation beams. Based on my personal review, I approved the simulation verification. The patient's treatment will proceed as planned.  ________________________________   Radene Gunning, MD, PhD

## 2012-08-31 NOTE — Telephone Encounter (Signed)
Er MD, notified pt to continue to monitor for "tarry or bloody stools" and proceed to Urgent Care or ED if symptoms arise.Pt continue to take Potassium as previously instructed.  Pt verbalized understanding.

## 2012-09-09 ENCOUNTER — Encounter: Payer: Self-pay | Admitting: Adult Health

## 2012-09-09 ENCOUNTER — Other Ambulatory Visit: Payer: Self-pay | Admitting: *Deleted

## 2012-09-09 ENCOUNTER — Ambulatory Visit (HOSPITAL_BASED_OUTPATIENT_CLINIC_OR_DEPARTMENT_OTHER): Payer: Medicare Other

## 2012-09-09 ENCOUNTER — Other Ambulatory Visit (HOSPITAL_BASED_OUTPATIENT_CLINIC_OR_DEPARTMENT_OTHER): Payer: Medicare Other | Admitting: Lab

## 2012-09-09 ENCOUNTER — Ambulatory Visit (HOSPITAL_BASED_OUTPATIENT_CLINIC_OR_DEPARTMENT_OTHER): Payer: Medicare Other | Admitting: Adult Health

## 2012-09-09 VITALS — BP 116/60 | HR 70 | Temp 97.9°F | Resp 20

## 2012-09-09 VITALS — BP 119/71 | HR 80 | Temp 98.1°F | Resp 20 | Ht 62.0 in | Wt 129.6 lb

## 2012-09-09 DIAGNOSIS — Z5112 Encounter for antineoplastic immunotherapy: Secondary | ICD-10-CM

## 2012-09-09 DIAGNOSIS — C8590 Non-Hodgkin lymphoma, unspecified, unspecified site: Secondary | ICD-10-CM

## 2012-09-09 DIAGNOSIS — C8589 Other specified types of non-Hodgkin lymphoma, extranodal and solid organ sites: Secondary | ICD-10-CM

## 2012-09-09 DIAGNOSIS — Z5111 Encounter for antineoplastic chemotherapy: Secondary | ICD-10-CM

## 2012-09-09 DIAGNOSIS — R109 Unspecified abdominal pain: Secondary | ICD-10-CM

## 2012-09-09 DIAGNOSIS — C7952 Secondary malignant neoplasm of bone marrow: Secondary | ICD-10-CM

## 2012-09-09 DIAGNOSIS — C50919 Malignant neoplasm of unspecified site of unspecified female breast: Secondary | ICD-10-CM

## 2012-09-09 LAB — CBC WITH DIFFERENTIAL/PLATELET
Basophils Absolute: 0.1 10*3/uL (ref 0.0–0.1)
Eosinophils Absolute: 0.4 10*3/uL (ref 0.0–0.5)
HCT: 32.3 % — ABNORMAL LOW (ref 34.8–46.6)
HGB: 10.1 g/dL — ABNORMAL LOW (ref 11.6–15.9)
MCH: 25.8 pg (ref 25.1–34.0)
MCV: 82.4 fL (ref 79.5–101.0)
MONO%: 26.1 % — ABNORMAL HIGH (ref 0.0–14.0)
NEUT#: 3.1 10*3/uL (ref 1.5–6.5)
NEUT%: 50.9 % (ref 38.4–76.8)
Platelets: 260 10*3/uL (ref 145–400)
RDW: 17.2 % — ABNORMAL HIGH (ref 11.2–14.5)

## 2012-09-09 LAB — COMPREHENSIVE METABOLIC PANEL (CC13)
Albumin: 3.1 g/dL — ABNORMAL LOW (ref 3.5–5.0)
BUN: 4 mg/dL — ABNORMAL LOW (ref 7.0–26.0)
CO2: 23 mEq/L (ref 22–29)
Glucose: 97 mg/dl (ref 70–99)
Sodium: 138 mEq/L (ref 136–145)
Total Bilirubin: 0.47 mg/dL (ref 0.20–1.20)
Total Protein: 6 g/dL — ABNORMAL LOW (ref 6.4–8.3)

## 2012-09-09 MED ORDER — SODIUM CHLORIDE 0.9 % IV SOLN
375.0000 mg/m2 | Freq: Once | INTRAVENOUS | Status: AC
  Administered 2012-09-09: 600 mg via INTRAVENOUS
  Filled 2012-09-09: qty 60

## 2012-09-09 MED ORDER — SODIUM CHLORIDE 0.9 % IV SOLN
70.0000 mg/m2 | Freq: Once | INTRAVENOUS | Status: AC
  Administered 2012-09-09: 115 mg via INTRAVENOUS
  Filled 2012-09-09: qty 23

## 2012-09-09 MED ORDER — DIPHENHYDRAMINE HCL 25 MG PO CAPS
50.0000 mg | ORAL_CAPSULE | Freq: Once | ORAL | Status: AC
  Administered 2012-09-09: 50 mg via ORAL

## 2012-09-09 MED ORDER — ONDANSETRON 8 MG/50ML IVPB (CHCC)
8.0000 mg | Freq: Once | INTRAVENOUS | Status: AC
  Administered 2012-09-09: 8 mg via INTRAVENOUS

## 2012-09-09 MED ORDER — ACETAMINOPHEN 325 MG PO TABS
650.0000 mg | ORAL_TABLET | Freq: Once | ORAL | Status: AC
  Administered 2012-09-09: 650 mg via ORAL

## 2012-09-09 MED ORDER — SODIUM CHLORIDE 0.9 % IJ SOLN
10.0000 mL | INTRAMUSCULAR | Status: DC | PRN
  Administered 2012-09-09: 10 mL
  Filled 2012-09-09: qty 10

## 2012-09-09 MED ORDER — SODIUM CHLORIDE 0.9 % IV SOLN
Freq: Once | INTRAVENOUS | Status: AC
  Administered 2012-09-09: 11:00:00 via INTRAVENOUS

## 2012-09-09 MED ORDER — POTASSIUM CHLORIDE CRYS ER 20 MEQ PO TBCR: 20.0000 meq | EXTENDED_RELEASE_TABLET | Freq: Every day | ORAL | Status: DC

## 2012-09-09 MED ORDER — DEXAMETHASONE SODIUM PHOSPHATE 10 MG/ML IJ SOLN
10.0000 mg | Freq: Once | INTRAMUSCULAR | Status: AC
  Administered 2012-09-09: 10 mg via INTRAVENOUS

## 2012-09-09 MED ORDER — HEPARIN SOD (PORK) LOCK FLUSH 100 UNIT/ML IV SOLN
500.0000 [IU] | Freq: Once | INTRAVENOUS | Status: AC | PRN
  Administered 2012-09-09: 500 [IU]
  Filled 2012-09-09: qty 5

## 2012-09-09 NOTE — Patient Instructions (Signed)
[facility] Discharge Instructions for Patients Receiving Chemotherapy  Today you received the following chemotherapy agents rituxan and Treanda  To help prevent nausea and vomiting after your treatment, we encourage you to take your nausea medication as prescribed.   If you develop nausea and vomiting that is not controlled by your nausea medication, call the clinic. If it is after clinic hours your family physician or the after hours number for the clinic or go to the Emergency Department.   BELOW ARE SYMPTOMS THAT SHOULD BE REPORTED IMMEDIATELY:  *FEVER GREATER THAN 100.5 F  *CHILLS WITH OR WITHOUT FEVER  NAUSEA AND VOMITING THAT IS NOT CONTROLLED WITH YOUR NAUSEA MEDICATION  *UNUSUAL SHORTNESS OF BREATH  *UNUSUAL BRUISING OR BLEEDING  TENDERNESS IN MOUTH AND THROAT WITH OR WITHOUT PRESENCE OF ULCERS  *URINARY PROBLEMS  *BOWEL PROBLEMS  UNUSUAL RASH Items with * indicate a potential emergency and should be followed up as soon as possible.  Feel free to call the clinic you have any questions or concerns. The clinic phone number is [phone].   I have been informed and understand all the instructions given to me. I know to contact the clinic, my physician, or go to the Emergency Department if any problems should occur. I do not have any questions at this time, but understand that I may call the clinic during office hours   should I have any questions or need assistance in obtaining follow up care.

## 2012-09-09 NOTE — Patient Instructions (Signed)
Doing well.  Proceed with chemotherapy.  Continue taking your prilosec.

## 2012-09-09 NOTE — Progress Notes (Signed)
OFFICE PROGRESS NOTE  CC  [name], MD [address] [facility] [address] [place], [address] [place] [address]  DIAGNOSIS: 77 year old female with:  #1 left breast carcinoma originally presenting as a fungating mass. Patient underwent neoadjuvant chemotherapy followed by mastectomy. She is currently on Arimidex 1mg  daily.   #2 low-grade non-Hodgkin lymphoma status post CVP and Rituxan now receiving R-Bendamustine.   PRIOR THERAPY:  #1 the patient was diagnosed with metastatic breast carcinoma beginning October 2009. She returned her 1 chemotherapy followed by mastectomy radiation. She was on letrozole 2.5 mg daily, but has changed to Arimidex 1mg  daily.    #2 Was diagnosed with low-grade non-Hodgkin lymphoma  12. She received 6 cycles of CVP and Rituxan, followed by maintenance Rituxan, who recurred and is now on R-Bendamustine.    CURRENT THERAPY:  radiation oncology for palliative radiation therapy to the lumbar spine. R-Bendamustine c 2 day 1 with Neulasta support  INTERVAL HISTORY: Helen [name] 77 y.o. female returns for her next cycle of chemotherapy.  During her week off, she called and reported having black stool and was not able to make it in for an appointment.  Due to this, we called in Omeprazole 40 mg which she has been taking daily.  She's doing well with this, and has not had subsequent dark bowel movements.  The good thing is that she has been taking a stool softener daily, and has been having regular bowel movements now.  She finished her potassium pills and is feeling well.  She endorses generalized achiness, and feeling like her legs are swollen, but is otherwise w/o complaints.    MEDICAL HISTORY: Past Medical History  Diagnosis Date  . Arthritis   . Blood transfusion 2009  . Breast CA 08/2008    (LT) breast ca dx 10/09/Chemo  . Lymphoma 09/09/2011    NHL  . Leukemia, acute, in remission 2012    Pt. not sure of type  . Hypertension   . Seizures 1980's    from  medication reaction/Pt.  [name] Hx of radiation therapy 09/11/08 -10/31/08    left breast  . Metastasis to bone 07/03/2012    MRI L spine  . History of radiation therapy eot 07/30/12    lumbar spine L4 /l shoulder    ALLERGIES:  is allergic to codeine; penicillins; sulfonamide derivatives; tape; and tramadol hcl.  MEDICATIONS:  Current Outpatient Prescriptions  Medication Sig Dispense Refill  . anastrozole (ARIMIDEX) 1 MG tablet Take 1 tablet (1 mg total) by mouth daily.  30 tablet  6  . omeprazole (PRILOSEC) 40 MG capsule Take 1 capsule (40 mg total) by mouth daily.  30 capsule  2  . oxycodone (OXY-IR) 5 MG capsule Take 1 capsule (5 mg total) by mouth every 4 (four) hours as needed.  90 capsule  0  . potassium chloride SA (K-DUR,KLOR-CON) 20 MEQ tablet Take 1 tablet (20 mEq total) by mouth daily.  10 tablet  0    SURGICAL HISTORY:  Past Surgical History  Procedure Date  . Mastectomy   . Thyroidectomy 1965  . Appendectomy   . Tonsillectomy     REVIEW OF SYSTEMS:  General: fatigue (-), night sweats (-), fever (-), pain (-) Lymph: palpable nodes (-) HEENT: vision changes (-), mucositis (-), gum bleeding (-), epistaxis (-) Cardiovascular: chest pain (-), palpitations (-) Pulmonary: shortness of breath (-), dyspnea on exertion (-), cough (-), hemoptysis (-) GI:  Early satiety (-), melena (-), dysphagia (-), nausea/vomiting (-),  diarrhea (-) GU: dysuria (-), hematuria (-), incontinence (-) Musculoskeletal: joint swelling (-), joint pain (-), back pain (-) Neuro: weakness (-), numbness (-), headache (-), confusion (-) Skin: Rash (-), lesions (-), dryness (-) Psych: depression (-), suicidal/homicidal ideation (-), feeling of hopelessness (-)   PHYSICAL EXAMINATION:  BP 119/71  Pulse 80  Temp 98.1 F (36.7 C) (Oral)  Resp 20  Ht 5\' 2"  (1.575 m)  Wt 129 lb 9.6 oz (58.786 kg)  BMI 23.70 kg/m2 General: Patient is a well appearing female in no acute distress HEENT: PERRLA, sclerae  anicteric no conjunctival pallor, MMM Neck: supple, no palpable adenopathy Lungs: clear to auscultation bilaterally, no wheezes, rhonchi, or rales Cardiovascular: regular rate rhythm, S1, S2, no murmurs, rubs or gallops Abdomen: Soft, mild epigastric tenderness, non-distended, normoactive bowel sounds, no HSM Extremities: warm and well perfused, no clubbing, cyanosis, or edema Skin: No rashes or lesions Neuro: Non-focal ECOG PERFORMANCE STATUS: 1 - Symptomatic but completely ambulatory  LABORATORY DATA: Lab Results  Component Value Date   WBC 6.0 09/09/2012   HGB 10.1* 09/09/2012   HCT 32.3* 09/09/2012   MCV 82.4 09/09/2012   PLT 260 09/09/2012      Chemistry      Component Value Date/Time   NA 136 08/26/2012 0933   NA 136 07/06/2012 1122   NA 142 06/01/2012 1008   K 3.0* 08/26/2012 0933   K 3.8 07/06/2012 1122   K 3.8 06/01/2012 1008   CL 103 08/26/2012 0933   CL 102 07/06/2012 1122   CL 103 06/01/2012 1008   CO2 22 08/26/2012 0933   CO2 20 07/06/2012 1122   CO2 25 06/01/2012 1008   BUN 5.0* 08/26/2012 0933   BUN 8 07/06/2012 1122   BUN 9 06/01/2012 1008   CREATININE 0.5* 08/26/2012 0933   CREATININE 0.60 07/06/2012 1122   CREATININE 0.9 06/01/2012 1008      Component Value Date/Time   CALCIUM 8.2* 08/26/2012 0933   CALCIUM 9.1 07/06/2012 1122   CALCIUM 9.8 06/01/2012 1008   ALKPHOS 104 08/26/2012 0933   ALKPHOS 69 02/10/2012 1035   ALKPHOS 78 08/14/2010 1251   AST 39* 08/26/2012 0933   AST 20 02/10/2012 1035   AST 19 08/14/2010 1251   ALT 58* 08/26/2012 0933   ALT 19 02/10/2012 1035   BILITOT 0.30 08/26/2012 0933   BILITOT 0.4 02/10/2012 1035   BILITOT 0.40 08/14/2010 1251       RADIOGRAPHIC STUDIES: NUCLEAR MEDICINE PET CT SKULL BASE TO THIGH  Technique: Technique: 18.8 mCi F-18 FDG was injected  intravenously. CT data was obtained and used for attenuation  correction and anatomic localization only. (This was not acquired  as a diagnostic CT examination.) Additional exam technical  data  entered on technologist worksheet.  Comparison: PET of 02/07/2011. CTs of 06/01/2012.  Findings: Neck: Hypermetabolism within the left side of the  mandible, with suspicion of concurrent periapical focal lucency on  image 24.  Bilateral cervical hypermetabolic lymph nodes. Hypermetabolism  which is primarily felt to be nodal surrounds the right jugular  vein and extends into the thoracic inlet. This measures a S.U.V.  max of 9.9 on image 57  Chest: Hypermetabolic right axillary node. This measures 1.7 cm  and a S.U.V. max of 6.1 on image 82. On the prior PET, this node  measured 1.7 cm and a S.U.V. max of 3.1.  Small prevascular nodes which are hypermetabolic, including on  image 72.  Abdomen/Pelvis: Right adrenal hypermetabolism which is without  CT  correlate. Hypermetabolic left external iliac adenopathy. This  measures 1.3 cm and a S.U.V. max of 5.0 on image 181. On the prior  PET, this node measured 1.6 cm and on the order of a S.U.V. max of  2.6.  Skelton: Multifocal marrow hypermetabolism consistent with osseous  involvement. Index lesion in the right humeral head measures a  S.U.V. max of 14.4 on image 53 and is new.  A hypermetabolic lytic lesion within the left iliac wing is new or  progressive and measures a S.U.V. max of 8.1 on image 155.  CT images performed for attenuation correction demonstrate  increased number and size of lymph nodes within the neck. Index  right jugulodigastric node measures 9 mm short axis on image 34  versus 6 mm on the prior PET. Chest, abdomen, and pelvic findings  deferred to recent diagnostic CTs. Trace right-sided pleural fluid  is new. Left upper lobe radiation fibrosis. Punctate left renal  calculi. Cholelithiasis. Fibroid uterus. L4 osseous metastasis  has possible epidural component on image 144 transverse.  IMPRESSION:  1. Increasing hypermetabolism associated with adenopathy within  the neck, chest, and pelvis, as detailed above.  Findings are most  likely indicative of progression of lymphoma.  2. Progressive osseous metastasis, as detailed above. An L4 lesion  has possible epidural component. Consider pre and post contrast  lumbar spine MRI.  3. Hypermetabolism at the right lower neck and thoracic inlet  surrounds the right internal jugular vein. This appears somewhat  hyperattenuating. Cannot exclude thrombus. Consider right upper  extremity venous ultrasound with attention to this area.  4. Likely dental inflammatory left mandibular hypermetabolism.  Consider physical exam correlation.   ASSESSMENT: 77 year old female with  #1Patient with history of low-grade non-Hodgkin lymphoma she initially received CVP Rituxan and then subsequently on maintenance Rituxan, now on R Bendamustine.    #2 metastatic breast carcinoma originally diagnosed in 2009 originally on letrozole 2.5 mg daily, now on Arimidex.  #3 L4 bone metastasis which is painful. Patient is now status post palliative radiation therapy.  #4 Melena and abdominal tenderness  PLAN:  #1 Ms. Hennick is doing well today.  She will proceed with cycle 2 of her chemotherapy.    #2 She is taking her Arimidex and tolerating it pretty well.  She does have hot flashes that she says are tolerable.    #3 She was started on Omeprazole and is doing well with this.  I will add an H pylori to her next blood draw.      #4  She is doing well.  She will return next week for interim labs and follow up.    All questions were answered. The patient knows to call the clinic with any problems, questions or concerns. We can certainly see the patient much sooner if necessary.  I spent 25 minutes counseling the patient face to face. The total time spent in the appointment was 30 minutes.  This case was reviewed with Dr. Welton Flakes.    Cherie Ouch Lyn Hollingshead, NP Medical Oncology [facility] Phone: [phone]   09/09/2012, 10:11 AM

## 2012-09-10 ENCOUNTER — Ambulatory Visit (HOSPITAL_BASED_OUTPATIENT_CLINIC_OR_DEPARTMENT_OTHER): Payer: Medicare Other

## 2012-09-10 VITALS — BP 125/62 | HR 60 | Temp 97.1°F | Resp 20

## 2012-09-10 DIAGNOSIS — C8590 Non-Hodgkin lymphoma, unspecified, unspecified site: Secondary | ICD-10-CM

## 2012-09-10 DIAGNOSIS — C8589 Other specified types of non-Hodgkin lymphoma, extranodal and solid organ sites: Secondary | ICD-10-CM

## 2012-09-10 DIAGNOSIS — Z5111 Encounter for antineoplastic chemotherapy: Secondary | ICD-10-CM

## 2012-09-10 MED ORDER — SODIUM CHLORIDE 0.9 % IJ SOLN
10.0000 mL | INTRAMUSCULAR | Status: DC | PRN
  Administered 2012-09-10: 10 mL
  Filled 2012-09-10: qty 10

## 2012-09-10 MED ORDER — DEXAMETHASONE SODIUM PHOSPHATE 10 MG/ML IJ SOLN
10.0000 mg | Freq: Once | INTRAMUSCULAR | Status: AC
  Administered 2012-09-10: 10 mg via INTRAVENOUS

## 2012-09-10 MED ORDER — BENDAMUSTINE HCL (LYOPHILIZED PWD) CHEMO INJECTION 100MG
70.0000 mg/m2 | Freq: Once | INTRAVENOUS | Status: AC
  Administered 2012-09-10: 115 mg via INTRAVENOUS
  Filled 2012-09-10: qty 23

## 2012-09-10 MED ORDER — ONDANSETRON 8 MG/50ML IVPB (CHCC)
8.0000 mg | Freq: Once | INTRAVENOUS | Status: AC
  Administered 2012-09-10: 8 mg via INTRAVENOUS

## 2012-09-10 MED ORDER — HEPARIN SOD (PORK) LOCK FLUSH 100 UNIT/ML IV SOLN
500.0000 [IU] | Freq: Once | INTRAVENOUS | Status: AC | PRN
  Administered 2012-09-10: 500 [IU]
  Filled 2012-09-10: qty 5

## 2012-09-10 MED ORDER — SODIUM CHLORIDE 0.9 % IV SOLN
Freq: Once | INTRAVENOUS | Status: AC
  Administered 2012-09-10: 15:00:00 via INTRAVENOUS

## 2012-09-10 NOTE — Patient Instructions (Signed)
Patient aware of next appointment; discharged home with no complaints. 

## 2012-09-11 ENCOUNTER — Ambulatory Visit (HOSPITAL_BASED_OUTPATIENT_CLINIC_OR_DEPARTMENT_OTHER): Payer: Medicare Other

## 2012-09-11 VITALS — BP 139/63 | HR 66 | Temp 97.5°F | Resp 16

## 2012-09-11 DIAGNOSIS — C7952 Secondary malignant neoplasm of bone marrow: Secondary | ICD-10-CM

## 2012-09-11 DIAGNOSIS — C8589 Other specified types of non-Hodgkin lymphoma, extranodal and solid organ sites: Secondary | ICD-10-CM

## 2012-09-11 DIAGNOSIS — C8590 Non-Hodgkin lymphoma, unspecified, unspecified site: Secondary | ICD-10-CM

## 2012-09-11 DIAGNOSIS — C50919 Malignant neoplasm of unspecified site of unspecified female breast: Secondary | ICD-10-CM

## 2012-09-11 MED ORDER — PEGFILGRASTIM INJECTION 6 MG/0.6ML
6.0000 mg | Freq: Once | SUBCUTANEOUS | Status: AC
  Administered 2012-09-11: 6 mg via SUBCUTANEOUS

## 2012-09-16 ENCOUNTER — Encounter: Payer: Self-pay | Admitting: Adult Health

## 2012-09-16 ENCOUNTER — Ambulatory Visit (HOSPITAL_BASED_OUTPATIENT_CLINIC_OR_DEPARTMENT_OTHER): Payer: Medicare Other | Admitting: Adult Health

## 2012-09-16 ENCOUNTER — Other Ambulatory Visit (HOSPITAL_BASED_OUTPATIENT_CLINIC_OR_DEPARTMENT_OTHER): Payer: Medicare Other | Admitting: Lab

## 2012-09-16 VITALS — BP 158/75 | HR 96 | Temp 98.1°F | Resp 20 | Ht 62.0 in | Wt 130.9 lb

## 2012-09-16 DIAGNOSIS — C50919 Malignant neoplasm of unspecified site of unspecified female breast: Secondary | ICD-10-CM

## 2012-09-16 DIAGNOSIS — C8589 Other specified types of non-Hodgkin lymphoma, extranodal and solid organ sites: Secondary | ICD-10-CM

## 2012-09-16 DIAGNOSIS — E876 Hypokalemia: Secondary | ICD-10-CM

## 2012-09-16 DIAGNOSIS — C8590 Non-Hodgkin lymphoma, unspecified, unspecified site: Secondary | ICD-10-CM

## 2012-09-16 DIAGNOSIS — Z853 Personal history of malignant neoplasm of breast: Secondary | ICD-10-CM

## 2012-09-16 DIAGNOSIS — C7952 Secondary malignant neoplasm of bone marrow: Secondary | ICD-10-CM

## 2012-09-16 LAB — COMPREHENSIVE METABOLIC PANEL (CC13)
ALT: 15 U/L (ref 0–55)
AST: 12 U/L (ref 5–34)
Albumin: 3 g/dL — ABNORMAL LOW (ref 3.5–5.0)
CO2: 25 mEq/L (ref 22–29)
Calcium: 8.8 mg/dL (ref 8.4–10.4)
Chloride: 106 mEq/L (ref 98–107)
Potassium: 3.5 mEq/L (ref 3.5–5.1)
Sodium: 141 mEq/L (ref 136–145)
Total Protein: 5.8 g/dL — ABNORMAL LOW (ref 6.4–8.3)

## 2012-09-16 LAB — CBC WITH DIFFERENTIAL/PLATELET
BASO%: 0.4 % (ref 0.0–2.0)
HCT: 33.8 % — ABNORMAL LOW (ref 34.8–46.6)
MCHC: 31.4 g/dL — ABNORMAL LOW (ref 31.5–36.0)
MONO#: 2.5 10*3/uL — ABNORMAL HIGH (ref 0.1–0.9)
NEUT%: 87.2 % — ABNORMAL HIGH (ref 38.4–76.8)
RBC: 4.01 10*6/uL (ref 3.70–5.45)
RDW: 18.5 % — ABNORMAL HIGH (ref 11.2–14.5)
WBC: 30.9 10*3/uL — ABNORMAL HIGH (ref 3.9–10.3)
lymph#: 0.5 10*3/uL — ABNORMAL LOW (ref 0.9–3.3)

## 2012-09-16 NOTE — Progress Notes (Signed)
OFFICE PROGRESS NOTE  CC  [name], MD [address] [facility] [address] [place], [address] [place] [address]  DIAGNOSIS: 77 year old female with:  #1 left breast carcinoma originally presenting as a fungating mass. Patient underwent neoadjuvant chemotherapy followed by mastectomy. She is currently on Arimidex 1mg  daily.   #2 low-grade non-Hodgkin lymphoma status post CVP and Rituxan now receiving R-Bendamustine.   PRIOR THERAPY:  #1 the patient was diagnosed with metastatic breast carcinoma beginning October 2009. She returned her 1 chemotherapy followed by mastectomy radiation. She was on letrozole 2.5 mg daily, but has changed to Arimidex 1mg  daily.    #2 Was diagnosed with low-grade non-Hodgkin lymphoma  12. She received 6 cycles of CVP and Rituxan, followed by maintenance Rituxan, who recurred and is now on R-Bendamustine.    CURRENT THERAPY:  radiation oncology for palliative radiation therapy to the lumbar spine. R-Bendamustine c 2 day 8 with Neulasta support  INTERVAL HISTORY: Thania Dewaele 77 y.o. female returns for labs today after her second cycle of R-Bendamustine.  She tolerated her treatment well.  After her last visit her potassium level came back low, she has been taking the potassium we prescribed.  She is doing well and without questions/concerns.    MEDICAL HISTORY: Past Medical History  Diagnosis Date  . Arthritis   . Blood transfusion 2009  . Breast CA 08/2008    (LT) breast ca dx 10/09/Chemo  . Lymphoma 09/09/2011    NHL  . Leukemia, acute, in remission 2012    Pt. not sure of type  . Hypertension   . Seizures 1980's    from medication reaction/Pt.  [name] Hx of radiation therapy 09/11/08 -10/31/08    left breast  . Metastasis to bone 07/03/2012    MRI L spine  . History of radiation therapy eot 07/30/12    lumbar spine L4 /l shoulder    ALLERGIES:  is allergic to codeine; penicillins; sulfonamide derivatives; tape; and tramadol hcl.  MEDICATIONS:    Current Outpatient Prescriptions  Medication Sig Dispense Refill  . anastrozole (ARIMIDEX) 1 MG tablet Take 1 tablet (1 mg total) by mouth daily.  30 tablet  6  . omeprazole (PRILOSEC) 40 MG capsule Take 1 capsule (40 mg total) by mouth daily.  30 capsule  2  . oxycodone (OXY-IR) 5 MG capsule Take 1 capsule (5 mg total) by mouth every 4 (four) hours as needed.  90 capsule  0  . potassium chloride SA (K-DUR,KLOR-CON) 20 MEQ tablet Take 1 tablet (20 mEq total) by mouth daily.  30 tablet  0    SURGICAL HISTORY:  Past Surgical History  Procedure Date  . Mastectomy   . Thyroidectomy 1965  . Appendectomy   . Tonsillectomy     REVIEW OF SYSTEMS:  General: fatigue (-), night sweats (-), fever (-), pain (-) Lymph: palpable nodes (-) HEENT: vision changes (-), mucositis (-), gum bleeding (-), epistaxis (-) Cardiovascular: chest pain (-), palpitations (-) Pulmonary: shortness of breath (-), dyspnea on exertion (-), cough (-), hemoptysis (-) GI:  Early satiety (-), melena (-), dysphagia (-), nausea/vomiting (-), diarrhea (-) GU: dysuria (-), hematuria (-), incontinence (-) Musculoskeletal: joint swelling (-), joint pain (-), back pain (-) Neuro: weakness (-), numbness (-), headache (-), confusion (-) Skin: Rash (-), lesions (-), dryness (-) Psych: depression (-), suicidal/homicidal ideation (-), feeling of hopelessness (-)   PHYSICAL EXAMINATION:  BP 158/75  Pulse 96  Temp 98.1 F (36.7 C) (Oral)  Resp 20  Ht 5\' 2"  (1.575 m)  Wt 130 lb 14.4 oz (59.376 kg)  BMI 23.94 kg/m2 General: Patient is a well appearing female in no acute distress HEENT: PERRLA, sclerae anicteric no conjunctival pallor, MMM Neck: supple, no palpable adenopathy Lungs: clear to auscultation bilaterally, no wheezes, rhonchi, or rales Cardiovascular: regular rate rhythm, S1, S2, no murmurs, rubs or gallops Abdomen: Soft, mild epigastric tenderness, non-distended, normoactive bowel sounds, no HSM Extremities: warm  and well perfused, no clubbing, cyanosis, or edema Skin: No rashes or lesions Neuro: Non-focal ECOG PERFORMANCE STATUS: 1 - Symptomatic but completely ambulatory  LABORATORY DATA: Lab Results  Component Value Date   WBC 30.9* 09/16/2012   HGB 10.6* 09/16/2012   HCT 33.8* 09/16/2012   MCV 84.3 09/16/2012   PLT 282 09/16/2012      Chemistry      Component Value Date/Time   NA 138 09/09/2012 0937   NA 136 07/06/2012 1122   NA 142 06/01/2012 1008   K 3.3* 09/09/2012 0937   K 3.8 07/06/2012 1122   K 3.8 06/01/2012 1008   CL 109* 09/09/2012 0937   CL 102 07/06/2012 1122   CL 103 06/01/2012 1008   CO2 23 09/09/2012 0937   CO2 20 07/06/2012 1122   CO2 25 06/01/2012 1008   BUN 4.0* 09/09/2012 0937   BUN 8 07/06/2012 1122   BUN 9 06/01/2012 1008   CREATININE 0.6 09/09/2012 0937   CREATININE 0.60 07/06/2012 1122   CREATININE 0.9 06/01/2012 1008      Component Value Date/Time   CALCIUM 8.5 09/09/2012 0937   CALCIUM 9.1 07/06/2012 1122   CALCIUM 9.8 06/01/2012 1008   ALKPHOS 87 09/09/2012 0937   ALKPHOS 69 02/10/2012 1035   ALKPHOS 78 08/14/2010 1251   AST 23 09/09/2012 0937   AST 20 02/10/2012 1035   AST 19 08/14/2010 1251   ALT 31 09/09/2012 0937   ALT 19 02/10/2012 1035   BILITOT 0.47 09/09/2012 0937   BILITOT 0.4 02/10/2012 1035   BILITOT 0.40 08/14/2010 1251       RADIOGRAPHIC STUDIES: NUCLEAR MEDICINE PET CT SKULL BASE TO THIGH  Technique: Technique: 18.8 mCi F-18 FDG was injected  intravenously. CT data was obtained and used for attenuation  correction and anatomic localization only. (This was not acquired  as a diagnostic CT examination.) Additional exam technical data  entered on technologist worksheet.  Comparison: PET of 02/07/2011. CTs of 06/01/2012.  Findings: Neck: Hypermetabolism within the left side of the  mandible, with suspicion of concurrent periapical focal lucency on  image 24.  Bilateral cervical hypermetabolic lymph nodes. Hypermetabolism  which is primarily felt to be nodal  surrounds the right jugular  vein and extends into the thoracic inlet. This measures a S.U.V.  max of 9.9 on image 57  Chest: Hypermetabolic right axillary node. This measures 1.7 cm  and a S.U.V. max of 6.1 on image 82. On the prior PET, this node  measured 1.7 cm and a S.U.V. max of 3.1.  Small prevascular nodes which are hypermetabolic, including on  image 72.  Abdomen/Pelvis: Right adrenal hypermetabolism which is without CT  correlate. Hypermetabolic left external iliac adenopathy. This  measures 1.3 cm and a S.U.V. max of 5.0 on image 181. On the prior  PET, this node measured 1.6 cm and on the order of a S.U.V. max of  2.6.  Skelton: Multifocal marrow hypermetabolism consistent with osseous  involvement. Index lesion in the right humeral head measures a  S.U.V. max of  14.4 on image 53 and is new.  A hypermetabolic lytic lesion within the left iliac wing is new or  progressive and measures a S.U.V. max of 8.1 on image 155.  CT images performed for attenuation correction demonstrate  increased number and size of lymph nodes within the neck. Index  right jugulodigastric node measures 9 mm short axis on image 34  versus 6 mm on the prior PET. Chest, abdomen, and pelvic findings  deferred to recent diagnostic CTs. Trace right-sided pleural fluid  is new. Left upper lobe radiation fibrosis. Punctate left renal  calculi. Cholelithiasis. Fibroid uterus. L4 osseous metastasis  has possible epidural component on image 144 transverse.  IMPRESSION:  1. Increasing hypermetabolism associated with adenopathy within  the neck, chest, and pelvis, as detailed above. Findings are most  likely indicative of progression of lymphoma.  2. Progressive osseous metastasis, as detailed above. An L4 lesion  has possible epidural component. Consider pre and post contrast  lumbar spine MRI.  3. Hypermetabolism at the right lower neck and thoracic inlet  surrounds the right internal jugular vein. This  appears somewhat  hyperattenuating. Cannot exclude thrombus. Consider right upper  extremity venous ultrasound with attention to this area.  4. Likely dental inflammatory left mandibular hypermetabolism.  Consider physical exam correlation.   ASSESSMENT: 77 year old female with  #1Patient with history of low-grade non-Hodgkin lymphoma she initially received CVP Rituxan and then subsequently on maintenance Rituxan, now on R Bendamustine.    #2 metastatic breast carcinoma originally diagnosed in 2009 originally on letrozole 2.5 mg daily, now on Arimidex.  #3 L4 bone metastasis which is painful. Patient is now status post palliative radiation therapy.  #4 hypokalemia  #5 melena/abd tenderness  PLAN:  #1 Ms. Paff is doing well today.  Her labs are doing well.  She will return tomorrow for [facility] and next week for labs.    #2 She is taking her Arimidex and tolerating it pretty well.    #3 She was started on Omeprazole and is doing well with this.  I am awaiting her H. Pylori results  #4 She is taking Kdur as prescribed.  I will follow up on her potassium level today.    #5  She is doing well.  She will return next week for interim labs and follow up.    All questions were answered. The patient knows to call the clinic with any problems, questions or concerns. We can certainly see the patient much sooner if necessary.  I spent 15 minutes counseling the patient face to face. The total time spent in the appointment was 30 minutes.  This case was reviewed with Dr. Welton Flakes.    Cherie Ouch Lyn Hollingshead, NP Medical Oncology [facility] Phone: [phone]   09/16/2012, 9:57 AM

## 2012-09-16 NOTE — Patient Instructions (Addendum)
Doing well.  Labs look good.  We will see you at your next appt.  Please call us if you have any questions or concerns.

## 2012-09-17 ENCOUNTER — Ambulatory Visit (HOSPITAL_BASED_OUTPATIENT_CLINIC_OR_DEPARTMENT_OTHER): Payer: Medicare Other

## 2012-09-17 VITALS — BP 129/73 | HR 95 | Temp 98.7°F

## 2012-09-17 DIAGNOSIS — C7951 Secondary malignant neoplasm of bone: Secondary | ICD-10-CM

## 2012-09-17 DIAGNOSIS — C7952 Secondary malignant neoplasm of bone marrow: Secondary | ICD-10-CM

## 2012-09-17 DIAGNOSIS — C50919 Malignant neoplasm of unspecified site of unspecified female breast: Secondary | ICD-10-CM

## 2012-09-17 LAB — HELICOBACTER PYLORI ABS-IGG+IGA, BLD
H Pylori IgG: 4.56 {ISR} — ABNORMAL HIGH
HELICOBACTER PYLORI AB, IGA: 2 U/mL (ref <9.0)

## 2012-09-17 MED ORDER — DENOSUMAB 120 MG/1.7ML ~~LOC~~ SOLN
120.0000 mg | Freq: Once | SUBCUTANEOUS | Status: AC
  Administered 2012-09-17: 120 mg via SUBCUTANEOUS
  Filled 2012-09-17: qty 1.7

## 2012-09-23 ENCOUNTER — Ambulatory Visit (HOSPITAL_BASED_OUTPATIENT_CLINIC_OR_DEPARTMENT_OTHER): Payer: Medicare Other | Admitting: Adult Health

## 2012-09-23 ENCOUNTER — Encounter: Payer: Self-pay | Admitting: Oncology

## 2012-09-23 ENCOUNTER — Other Ambulatory Visit (HOSPITAL_BASED_OUTPATIENT_CLINIC_OR_DEPARTMENT_OTHER): Payer: Medicare Other | Admitting: Lab

## 2012-09-23 ENCOUNTER — Encounter: Payer: Self-pay | Admitting: Adult Health

## 2012-09-23 VITALS — BP 144/76 | HR 98 | Temp 98.0°F | Resp 20 | Ht 62.0 in | Wt 129.5 lb

## 2012-09-23 DIAGNOSIS — C8590 Non-Hodgkin lymphoma, unspecified, unspecified site: Secondary | ICD-10-CM

## 2012-09-23 DIAGNOSIS — K279 Peptic ulcer, site unspecified, unspecified as acute or chronic, without hemorrhage or perforation: Secondary | ICD-10-CM

## 2012-09-23 DIAGNOSIS — C50919 Malignant neoplasm of unspecified site of unspecified female breast: Secondary | ICD-10-CM

## 2012-09-23 DIAGNOSIS — C7952 Secondary malignant neoplasm of bone marrow: Secondary | ICD-10-CM

## 2012-09-23 DIAGNOSIS — C8589 Other specified types of non-Hodgkin lymphoma, extranodal and solid organ sites: Secondary | ICD-10-CM

## 2012-09-23 LAB — COMPREHENSIVE METABOLIC PANEL (CC13)
ALT: 12 U/L (ref 0–55)
CO2: 23 mEq/L (ref 22–29)
Calcium: 9.2 mg/dL (ref 8.4–10.4)
Chloride: 107 mEq/L (ref 98–107)
Sodium: 139 mEq/L (ref 136–145)
Total Protein: 6.1 g/dL — ABNORMAL LOW (ref 6.4–8.3)

## 2012-09-23 LAB — CBC WITH DIFFERENTIAL/PLATELET
BASO%: 0.6 % (ref 0.0–2.0)
MCHC: 32.6 g/dL (ref 31.5–36.0)
MONO#: 1.1 10*3/uL — ABNORMAL HIGH (ref 0.1–0.9)
RBC: 4.06 10*6/uL (ref 3.70–5.45)
RDW: 20.1 % — ABNORMAL HIGH (ref 11.2–14.5)
WBC: 8 10*3/uL (ref 3.9–10.3)
lymph#: 0.2 10*3/uL — ABNORMAL LOW (ref 0.9–3.3)

## 2012-09-23 NOTE — Progress Notes (Signed)
Checked in patient

## 2012-09-23 NOTE — Patient Instructions (Addendum)
You were found to be positive for a bacteria that causes ulcers in your stomach called H. Pylori.  I have prescribed a Helidac pack at your local pharmacy.  Please take as directed.  STOP taking your Omeprazole for two weeks while you take the Helidac pack.  Instead take Ranitidine (over the counter) 75 mg once in the morning and once in the evening.  You can also stop taking your potassium while you take this since your most recent level was normal.  Please call us if you have any questions or concerns.     Bismuth Subsalicylate; Metronidazole; Tetracycline tablets and capsules What is this medicine? BISMUTH SUBSALICYLATE; METRONIDAZOLE; TETRACYCLINE (biz muth sub sa LIS i late; me troe NI da zole; tet ra SYE kleen) is a combination antibiotic. It is used to treat stomach ulcers associated with Helicobacter pylori, a bacterial infection. This medicine may be used for other purposes; ask your health care provider or pharmacist if you have questions. What should I tell my health care provider before I take this medicine? They need to know if you have any of these conditions: -kidney disease -liver disease -nerve problems like neuropathy -recent vaccination with chickenpox vaccine -recent viral illness, such as the flu or chickenpox -seizures -an unusual or allergic reaction to bismuth subsalicylate, metronidazole, tetracycline, aspirin or other salicylates, or other medicines, foods, dyes, or preservatives -pregnant or trying to get pregnant -breast-feeding How should I use this medicine? Take this medicine by mouth with a full glass of water. Do not take it with milk or other dairy products. Follow the directions on the prescription label. This medicine should be taken after meals and at bedtime. Do not take your medicine more often than directed. Take all of your medicine as directed even if you think you are better. Do not skip doses or stop your medicine early. Talk to your pediatrician  regarding the use of this medicine in children. This medicine is not approved for use in children. Overdosage: If you think you have taken too much of this medicine contact a poison control center or emergency room at once. NOTE: This medicine is only for you. Do not share this medicine with others. What if I miss a dose? If you miss a dose, take it as soon as you can. If it is almost time for your next dose, take only that dose. Do not take double or extra doses. If you miss more than 4 doses, contact your doctor or health care professional. What may interact with this medicine? Do not take this medicine with any of the following medications: -alcohol or any product that contains alcohol -disulfiram -medicines for HIV -methotrexate -other antibiotics like tetracyclines, sulfamethoxazole; trimethoprim -sertraline This medicine may also interact with the following medications: -antacids with aluminum, calcium, magnesium, sodium bicarbonate -aspirin and aspirin-like drugs -birth control pills -calcium supplements -cimetidine -iron supplements -lithium -magnesium supplements -medicines for diabetes -medicines that treat or prevent blood clots like warfarin -methoxyflurane -penicillin -phenobarbital -phenytoin -probenecid -sulfinpyrazone -zinc supplements or zinc lozenges This list may not describe all possible interactions. Give your health care provider a list of all the medicines, herbs, non-prescription drugs, or dietary supplements you use. Also tell them if you smoke, drink alcohol, or use illegal drugs. Some items may interact with your medicine. What should I watch for while using this medicine? Visit your doctor for regular check ups. Tell your doctor if your symptoms do not get better or if they get worse. Do not treat diarrhea  with over the counter products. Contact your doctor if you have diarrhea that lasts more than 2 days or if it is severe and watery. Avoid products  with aluminum, calcium, magnesium, iron, zinc, or sodium bicarbonate for 2 hours before and after taking a dose of this medicine. This medicine can make you more sensitive to the sun. Keep out of the sun. If you cannot avoid being in the sun, wear protective clothing and use sunscreen. Do not use sun lamps or tanning beds/booths. Birth control pills may not work properly while you are taking this medicine. Talk to your doctor about using an extra method of birth control. You may get drowsy or dizzy. Do not drive, use machinery, or do anything that needs mental alertness until you know how this medicine affects you. Do not stand or sit up quickly, especially if you are an older patient. This reduces the risk of dizzy or fainting spells. Alcohol may interfere with the effect of this medicine. Do not drink alcohol while you are taking this medicine and for at least 1 day after finishing treatment. This medicine may cause dark tongue and/or grayish black stools. This is only temporary and will go away when you stop taking this medicine. What side effects may I notice from receiving this medicine? Side effects that you should report to your doctor or health care professional as soon as possible: -allergic reactions like skin rash, itching or hives, swelling of the face, lips, or tongue -breathing problems -chest pain -dizzy -fast, irregular heartbeat -fever, flu like symptoms, sore throat -pain, tingling, numbness in the hands or feet -redness, blistering, peeling or loosening of the skin, including inside the mouth -seizures -throat pain, trouble swallowing -trouble passing urine or change in the amount of urine -unusual bleeding or bruising -unusually weak or tired -white patches in mouth Side effects that usually do not require medical attention (report to your doctor or health care professional if they continue or are bothersome): -aches, pain -black stools -dark  tongue -diarrhea -headache -metal taste in mouth -nausea -stomach gas, pain, upset -vaginal irritation, itch This list may not describe all possible side effects. Call your doctor for medical advice about side effects. You may report side effects to FDA at 1-800-FDA-1088. Where should I keep my medicine? Keep out of the reach of children. Store at room temperature between 20 and 25 degrees C (68 and 77 degrees F). Protect from light and moisture. Throw away any unused medicine after the expiration date. NOTE: This sheet is a summary. It may not cover all possible information. If you have questions about this medicine, talk to your doctor, pharmacist, or health care provider.  2013, [employer]. (01/19/2008 3:16:23 PM)

## 2012-09-23 NOTE — Progress Notes (Signed)
OFFICE PROGRESS NOTE  CC  Helen Linger, MD [address] [facility] [address] [place], [address] [place] [address]  DIAGNOSIS: 77 year old female with:  #1 left breast carcinoma originally presenting as a fungating mass. Patient underwent neoadjuvant chemotherapy followed by mastectomy. She is currently on Arimidex 1mg  daily.   #2 low-grade non-Hodgkin lymphoma status post CVP and Rituxan now receiving R-Bendamustine.   PRIOR THERAPY:  #1 the patient was diagnosed with metastatic breast carcinoma beginning October 2009. She returned her 1 chemotherapy followed by mastectomy radiation. She was on letrozole 2.5 mg daily, but has changed to Arimidex 1mg  daily.    #2 Was diagnosed with low-grade non-Hodgkin lymphoma  12. She received 6 cycles of CVP and Rituxan, followed by maintenance Rituxan, who recurred and is now on R-Bendamustine.    CURRENT THERAPY:  radiation oncology for palliative radiation therapy to the lumbar spine. R-Bendamustine c 2 day 8 with Neulasta support  INTERVAL HISTORY: Helen [name] 77 y.o. female returns today for interim labs after R-Bendamustine.  She is doing well today.  Her H. Pylori labwork came back positive.  She's without any problems, and is without concerns today.   MEDICAL HISTORY: Past Medical History  Diagnosis Date  . Arthritis   . Blood transfusion 2009  . Breast CA 08/2008    (LT) breast ca dx 10/09/Chemo  . Lymphoma 09/09/2011    NHL  . Leukemia, acute, in remission 2012    Pt. not sure of type  . Hypertension   . Seizures 1980's    from medication reaction/Pt.  [name] Hx of radiation therapy 09/11/08 -10/31/08    left breast  . Metastasis to bone 07/03/2012    MRI L spine  . History of radiation therapy eot 07/30/12    lumbar spine L4 /l shoulder    ALLERGIES:  is allergic to codeine; penicillins; sulfonamide derivatives; tape; and tramadol hcl.  MEDICATIONS:  Current Outpatient Prescriptions  Medication Sig Dispense Refill  . anastrozole  (ARIMIDEX) 1 MG tablet Take 1 tablet (1 mg total) by mouth daily.  30 tablet  6  . calcium-vitamin D (OSCAL WITH D) 500-200 MG-UNIT per tablet Take 2 tablets by mouth daily.      [name] omeprazole (PRILOSEC) 40 MG capsule Take 1 capsule (40 mg total) by mouth daily.  30 capsule  2  . oxycodone (OXY-IR) 5 MG capsule Take 1 capsule (5 mg total) by mouth every 4 (four) hours as needed.  90 capsule  0  . potassium chloride SA (K-DUR,KLOR-CON) 20 MEQ tablet Take 1 tablet (20 mEq total) by mouth daily.  30 tablet  0    SURGICAL HISTORY:  Past Surgical History  Procedure Date  . Mastectomy   . Thyroidectomy 1965  . Appendectomy   . Tonsillectomy     REVIEW OF SYSTEMS:  General: fatigue (-), night sweats (-), fever (-), pain (-) Lymph: palpable nodes (-) HEENT: vision changes (-), mucositis (-), gum bleeding (-), epistaxis (-) Cardiovascular: chest pain (-), palpitations (-) Pulmonary: shortness of breath (-), dyspnea on exertion (-), cough (-), hemoptysis (-) GI:  Early satiety (-), melena (-), dysphagia (-), nausea/vomiting (-), diarrhea (-) GU: dysuria (-), hematuria (-), incontinence (-) Musculoskeletal: joint swelling (-), joint pain (-), back pain (-) Neuro: weakness (-), numbness (-), headache (-), confusion (-) Skin: Rash (-), lesions (-), dryness (-) Psych: depression (-), suicidal/homicidal ideation (-), feeling of hopelessness (-)   PHYSICAL EXAMINATION:  BP 144/76  Pulse 98  Temp 98  F (36.7 C) (Oral)  Resp 20  Ht 5\' 2"  (1.575 m)  Wt 129 lb 8 oz (58.741 kg)  BMI 23.69 kg/m2 General: Patient is a well appearing female in no acute distress HEENT: PERRLA, sclerae anicteric no conjunctival pallor, MMM Neck: supple, no palpable adenopathy Lungs: clear to auscultation bilaterally, no wheezes, rhonchi, or rales Cardiovascular: regular rate rhythm, S1, S2, no murmurs, rubs or gallops Abdomen: Soft, mild epigastric tenderness, non-distended, normoactive bowel sounds, no  HSM Extremities: warm and well perfused, no clubbing, cyanosis, or edema Skin: No rashes or lesions Neuro: Non-focal ECOG PERFORMANCE STATUS: 1 - Symptomatic but completely ambulatory  LABORATORY DATA: Lab Results  Component Value Date   WBC 8.0 09/23/2012   HGB 11.1* 09/23/2012   HCT 34.0* 09/23/2012   MCV 83.9 09/23/2012   PLT 291 09/23/2012      Chemistry      Component Value Date/Time   NA 139 09/23/2012 0859   NA 136 07/06/2012 1122   NA 142 06/01/2012 1008   K 3.7 09/23/2012 0859   K 3.8 07/06/2012 1122   K 3.8 06/01/2012 1008   CL 107 09/23/2012 0859   CL 102 07/06/2012 1122   CL 103 06/01/2012 1008   CO2 23 09/23/2012 0859   CO2 20 07/06/2012 1122   CO2 25 06/01/2012 1008   BUN 6.0* 09/23/2012 0859   BUN 8 07/06/2012 1122   BUN 9 06/01/2012 1008   CREATININE 0.7 09/23/2012 0859   CREATININE 0.60 07/06/2012 1122   CREATININE 0.9 06/01/2012 1008      Component Value Date/Time   CALCIUM 9.2 09/23/2012 0859   CALCIUM 9.1 07/06/2012 1122   CALCIUM 9.8 06/01/2012 1008   ALKPHOS 120 09/23/2012 0859   ALKPHOS 69 02/10/2012 1035   ALKPHOS 78 08/14/2010 1251   AST 11 09/23/2012 0859   AST 20 02/10/2012 1035   AST 19 08/14/2010 1251   ALT 12 09/23/2012 0859   ALT 19 02/10/2012 1035   BILITOT 0.29 09/23/2012 0859   BILITOT 0.4 02/10/2012 1035   BILITOT 0.40 08/14/2010 1251       RADIOGRAPHIC STUDIES: NUCLEAR MEDICINE PET CT SKULL BASE TO THIGH  Technique: Technique: 18.8 mCi F-18 FDG was injected  intravenously. CT data was obtained and used for attenuation  correction and anatomic localization only. (This was not acquired  as a diagnostic CT examination.) Additional exam technical data  entered on technologist worksheet.  Comparison: PET of 02/07/2011. CTs of 06/01/2012.  Findings: Neck: Hypermetabolism within the left side of the  mandible, with suspicion of concurrent periapical focal lucency on  image 24.  Bilateral cervical hypermetabolic lymph nodes. Hypermetabolism  which  is primarily felt to be nodal surrounds the right jugular  vein and extends into the thoracic inlet. This measures a S.U.V.  max of 9.9 on image 57  Chest: Hypermetabolic right axillary node. This measures 1.7 cm  and a S.U.V. max of 6.1 on image 82. On the prior PET, this node  measured 1.7 cm and a S.U.V. max of 3.1.  Small prevascular nodes which are hypermetabolic, including on  image 72.  Abdomen/Pelvis: Right adrenal hypermetabolism which is without CT  correlate. Hypermetabolic left external iliac adenopathy. This  measures 1.3 cm and a S.U.V. max of 5.0 on image 181. On the prior  PET, this node measured 1.6 cm and on the order of a S.U.V. max of  2.6.  [place]: Multifocal marrow hypermetabolism consistent with osseous  involvement. Index lesion in the right  humeral head measures a  S.U.V. max of 14.4 on image 53 and is new.  A hypermetabolic lytic lesion within the left iliac wing is new or  progressive and measures a S.U.V. max of 8.1 on image 155.  CT images performed for attenuation correction demonstrate  increased number and size of lymph nodes within the neck. Index  right jugulodigastric node measures 9 mm short axis on image 34  versus 6 mm on the prior PET. Chest, abdomen, and pelvic findings  deferred to recent diagnostic CTs. Trace right-sided pleural fluid  is new. Left upper lobe radiation fibrosis. Punctate left renal  calculi. Cholelithiasis. Fibroid uterus. L4 osseous metastasis  has possible epidural component on image 144 transverse.  IMPRESSION:  1. Increasing hypermetabolism associated with adenopathy within  the neck, chest, and pelvis, as detailed above. Findings are most  likely indicative of progression of lymphoma.  2. Progressive osseous metastasis, as detailed above. An L4 lesion  has possible epidural component. Consider pre and post contrast  lumbar spine MRI.  3. Hypermetabolism at the right lower neck and thoracic inlet  surrounds the right  internal jugular vein. This appears somewhat  hyperattenuating. Cannot exclude thrombus. Consider right upper  extremity venous ultrasound with attention to this area.  4. Likely dental inflammatory left mandibular hypermetabolism.  Consider physical exam correlation.   ASSESSMENT: 77 year old female with  #1Patient with history of low-grade non-Hodgkin lymphoma she initially received CVP Rituxan and then subsequently on maintenance Rituxan, now on R Bendamustine.    #2 metastatic breast carcinoma originally diagnosed in 2009 originally on letrozole 2.5 mg daily, now on Arimidex.  #3 L4 bone metastasis which is painful. Patient is now status post palliative radiation therapy.  #4 H. Pylori  PLAN:  #1 Helen [name] is doing well today.  Her labs are doing well.  She will return next week for labs.  #2 She is taking her Arimidex and tolerating it pretty well.    #3 Her H pylori came back positive.  I had initially called [facility], spoke with a pharmacist and prescribed a Helidac pack (couldn't prescribe prevpak since patient allergic to penicillins), I gave Helen [name] detailed instructions regarding how to take the medication, and to take Ranitidine instead of Omeprazole during the two weeks she took her medication.  After Helen [name] left, I received a call from [facility] pharmacy stating that the Helidac pack was on back order with no release date.  I prescribed a pylera pack, and discussed with the pharmacist that instead of the patient taking Ranitidine with the new pack, to take her already prescribed Omeprazole.    #4 Her last potassium was 3.7.  She will not take any potassium for the next week, and we will follow up on her level next week.    #5  She is doing well.  She will return next week for interim labs and follow up.    All questions were answered. The patient knows to call the clinic with any problems, questions or concerns. We can certainly see the patient much sooner  if necessary.  I spent 25 minutes counseling the patient face to face. The total time spent in the appointment was 30 minutes.  This case was reviewed with Dr. Welton Flakes.    Cherie Ouch Lyn Hollingshead, NP Medical Oncology [facility] Phone: [phone]   09/23/2012, 11:15 AM

## 2012-09-24 ENCOUNTER — Other Ambulatory Visit: Payer: Self-pay | Admitting: Medical Oncology

## 2012-09-24 MED ORDER — AMOXICILLIN 500 MG PO CAPS: 500.0000 mg | ORAL_CAPSULE | Freq: Three times a day (TID) | ORAL | Status: DC

## 2012-09-24 MED ORDER — METRONIDAZOLE 500 MG PO TABS: 500.0000 mg | ORAL_TABLET | Freq: Three times a day (TID) | ORAL | Status: DC

## 2012-09-24 NOTE — Telephone Encounter (Signed)
Patient called regarding recent prescription, given to her by Augustin Schooling, NP " medication is too expensive, are there any other alternatives? My insurance will not pay for it."

## 2012-09-24 NOTE — Telephone Encounter (Signed)
[facility] pharmacy advised Heladek $800.00,  Recommends Amoxicillin 500mg  PO TID  X 2 wks( 7%  Cross rx to [facility]- substitution of Azithromycin is not an option-per Rx), Metronidazol 500mg  PO  BID  2wk, Omeprazole BID x 2wks. [facility] able to fill Rx 6.00-amoxicillin 30.00 Metronidazol. Pt currently has Rx for Omeprazole which may help decrease pt's cost. Reviewed with NP and MD.Notified pt of change in medications as listed above. Rx sent to pt's pharmacy.

## 2012-09-24 NOTE — Telephone Encounter (Signed)
Patient was prescribed a Pylera Pack for H. Pylori by NP.

## 2012-09-24 NOTE — Telephone Encounter (Signed)
What medication is it? And for what is she using it

## 2012-09-27 ENCOUNTER — Telehealth: Payer: Self-pay | Admitting: *Deleted

## 2012-09-27 NOTE — Telephone Encounter (Signed)
Patient called reporting she cannot afford the prescriptions Helen [name] called in last week.  Has [facility].  The meds will cost $600.00 per patient cost with insurance paying $40.00.  [name] would like a generic medicine.  Was to start this last Thursday.  Next F/U is 09-29-2012.  Will notify providers.  Patient may be reached at [phone].  Uses [facility] on [address].

## 2012-09-27 NOTE — Telephone Encounter (Signed)
Helen [name] notified of this call.  PA called [facility] with the order and meds will cost thirty dollars.  This nurse called pharmacy and the amoxicillin and flagyl meds cost $1.50 each.  Message left on patient's line with this information.  Asked for a return call if she has any questions.

## 2012-09-29 ENCOUNTER — Ambulatory Visit (HOSPITAL_BASED_OUTPATIENT_CLINIC_OR_DEPARTMENT_OTHER): Payer: Medicare Other | Admitting: Adult Health

## 2012-09-29 ENCOUNTER — Encounter: Payer: Self-pay | Admitting: Adult Health

## 2012-09-29 ENCOUNTER — Other Ambulatory Visit (HOSPITAL_BASED_OUTPATIENT_CLINIC_OR_DEPARTMENT_OTHER): Payer: Medicare Other | Admitting: Lab

## 2012-09-29 VITALS — BP 122/75 | HR 91 | Temp 97.7°F | Resp 20 | Ht 62.0 in | Wt 129.5 lb

## 2012-09-29 DIAGNOSIS — C8589 Other specified types of non-Hodgkin lymphoma, extranodal and solid organ sites: Secondary | ICD-10-CM

## 2012-09-29 DIAGNOSIS — C8590 Non-Hodgkin lymphoma, unspecified, unspecified site: Secondary | ICD-10-CM

## 2012-09-29 DIAGNOSIS — C50919 Malignant neoplasm of unspecified site of unspecified female breast: Secondary | ICD-10-CM

## 2012-09-29 DIAGNOSIS — C7952 Secondary malignant neoplasm of bone marrow: Secondary | ICD-10-CM

## 2012-09-29 LAB — CBC WITH DIFFERENTIAL/PLATELET
BASO%: 0.7 % (ref 0.0–2.0)
EOS%: 4 % (ref 0.0–7.0)
LYMPH%: 3 % — ABNORMAL LOW (ref 14.0–49.7)
MCH: 26.9 pg (ref 25.1–34.0)
MCHC: 32.2 g/dL (ref 31.5–36.0)
MONO#: 0.9 10*3/uL (ref 0.1–0.9)
Platelets: 308 10*3/uL (ref 145–400)
RBC: 4.01 10*6/uL (ref 3.70–5.45)
WBC: 5.7 10*3/uL (ref 3.9–10.3)
lymph#: 0.2 10*3/uL — ABNORMAL LOW (ref 0.9–3.3)

## 2012-09-29 LAB — COMPREHENSIVE METABOLIC PANEL (CC13)
ALT: 13 U/L (ref 0–55)
Alkaline Phosphatase: 104 U/L (ref 40–150)
CO2: 24 mEq/L (ref 22–29)
Creatinine: 0.7 mg/dL (ref 0.6–1.1)
Sodium: 140 mEq/L (ref 136–145)
Total Bilirubin: 0.26 mg/dL (ref 0.20–1.20)
Total Protein: 6.2 g/dL — ABNORMAL LOW (ref 6.4–8.3)

## 2012-09-29 NOTE — Progress Notes (Signed)
OFFICE PROGRESS NOTE  CC  [name], MD [address] [facility] [address] [place], [address] [place] [address]  DIAGNOSIS: 77 year old female with:  #1 left breast carcinoma originally presenting as a fungating mass. Patient underwent neoadjuvant chemotherapy followed by mastectomy. She is currently on Arimidex 1mg  daily.   #2 low-grade non-Hodgkin lymphoma status post CVP and Rituxan now receiving R-Bendamustine.   PRIOR THERAPY:  #1 the patient was diagnosed with metastatic breast carcinoma beginning October 2009. She returned her 1 chemotherapy followed by mastectomy radiation. She was on letrozole 2.5 mg daily, but has changed to Arimidex 1mg  daily.    #2 Was diagnosed with low-grade non-Hodgkin lymphoma  12. She received 6 cycles of CVP and Rituxan, followed by maintenance Rituxan, who recurred and is now on R-Bendamustine.    CURRENT THERAPY:  radiation oncology for palliative radiation therapy to the lumbar spine. R-Bendamustine c 2 day 15 with Neulasta support  INTERVAL HISTORY: Helen [name] 77 y.o. female returns today for interim labs after R-Bendamustine. She's doing well today.  There were multiple phone calls earlier this week regarding her prescription meds for the H.Pylori.  She's finally started her treatment, but is very hesitant to take the Omeprazole, because she had one episode of feeling nauseated after taking it last week.  Otherwise she is feeling well and w/o questions/concerns.    MEDICAL HISTORY: Past Medical History  Diagnosis Date  . Arthritis   . Blood transfusion 2009  . Breast CA 08/2008    (LT) breast ca dx 10/09/Chemo  . Lymphoma 09/09/2011    NHL  . Leukemia, acute, in remission 2012    Pt. not sure of type  . Hypertension   . Seizures 1980's    from medication reaction/Pt.  [name] Hx of radiation therapy 09/11/08 -10/31/08    left breast  . Metastasis to bone 07/03/2012    MRI L spine  . History of radiation therapy eot 07/30/12    lumbar spine L4  /l shoulder    ALLERGIES:  is allergic to codeine; penicillins; sulfonamide derivatives; tape; and tramadol hcl.  MEDICATIONS:  Current Outpatient Prescriptions  Medication Sig Dispense Refill  . amoxicillin (AMOXIL) 500 MG capsule Take 1 capsule (500 mg total) by mouth 3 (three) times daily.  42 capsule  0  . anastrozole (ARIMIDEX) 1 MG tablet Take 1 tablet (1 mg total) by mouth daily.  30 tablet  6  . calcium-vitamin D (OSCAL WITH D) 500-200 MG-UNIT per tablet Take 2 tablets by mouth daily.      . metroNIDAZOLE (FLAGYL) 500 MG tablet Take 1 tablet (500 mg total) by mouth 3 (three) times daily.  42 tablet  0  . omeprazole (PRILOSEC) 40 MG capsule Take 1 capsule (40 mg total) by mouth daily.  30 capsule  2  . oxycodone (OXY-IR) 5 MG capsule Take 1 capsule (5 mg total) by mouth every 4 (four) hours as needed.  90 capsule  0  . potassium chloride SA (K-DUR,KLOR-CON) 20 MEQ tablet Take 1 tablet (20 mEq total) by mouth daily.  30 tablet  0    SURGICAL HISTORY:  Past Surgical History  Procedure Date  . Mastectomy   . Thyroidectomy 1965  . Appendectomy   . Tonsillectomy     REVIEW OF SYSTEMS:  General: fatigue (-), night sweats (-), fever (-), pain (-) Lymph: palpable nodes (-) HEENT: vision changes (-), mucositis (-), gum bleeding (-), epistaxis (-) Cardiovascular: chest pain (-), palpitations (-) Pulmonary:  shortness of breath (-), dyspnea on exertion (-), cough (-), hemoptysis (-) GI:  Early satiety (-), melena (-), dysphagia (-), nausea/vomiting (-), diarrhea (-) GU: dysuria (-), hematuria (-), incontinence (-) Musculoskeletal: joint swelling (-), joint pain (-), back pain (-) Neuro: weakness (-), numbness (-), headache (-), confusion (-) Skin: Rash (-), lesions (-), dryness (-) Psych: depression (-), suicidal/homicidal ideation (-), feeling of hopelessness (-)   PHYSICAL EXAMINATION:  BP 122/75  Pulse 91  Temp 97.7 F (36.5 C) (Oral)  Resp 20  Ht 5\' 2"  (1.575 m)  Wt 129  lb 8 oz (58.741 kg)  BMI 23.69 kg/m2 General: Patient is a well appearing female in no acute distress HEENT: PERRLA, sclerae anicteric no conjunctival pallor, MMM Neck: supple, no palpable adenopathy Lungs: clear to auscultation bilaterally, no wheezes, rhonchi, or rales Cardiovascular: regular rate rhythm, S1, S2, no murmurs, rubs or gallops Abdomen: Soft, mild epigastric tenderness, non-distended, normoactive bowel sounds, no HSM Extremities: warm and well perfused, no clubbing, cyanosis, or edema Skin: No rashes or lesions Neuro: Non-focal ECOG PERFORMANCE STATUS: 1 - Symptomatic but completely ambulatory  LABORATORY DATA: Lab Results  Component Value Date   WBC 5.7 09/29/2012   HGB 10.8* 09/29/2012   HCT 33.4* 09/29/2012   MCV 83.4 09/29/2012   PLT 308 09/29/2012      Chemistry      Component Value Date/Time   NA 139 09/23/2012 0859   NA 136 07/06/2012 1122   NA 142 06/01/2012 1008   K 3.7 09/23/2012 0859   K 3.8 07/06/2012 1122   K 3.8 06/01/2012 1008   CL 107 09/23/2012 0859   CL 102 07/06/2012 1122   CL 103 06/01/2012 1008   CO2 23 09/23/2012 0859   CO2 20 07/06/2012 1122   CO2 25 06/01/2012 1008   BUN 6.0* 09/23/2012 0859   BUN 8 07/06/2012 1122   BUN 9 06/01/2012 1008   CREATININE 0.7 09/23/2012 0859   CREATININE 0.60 07/06/2012 1122   CREATININE 0.9 06/01/2012 1008      Component Value Date/Time   CALCIUM 9.2 09/23/2012 0859   CALCIUM 9.1 07/06/2012 1122   CALCIUM 9.8 06/01/2012 1008   ALKPHOS 120 09/23/2012 0859   ALKPHOS 69 02/10/2012 1035   ALKPHOS 78 08/14/2010 1251   AST 11 09/23/2012 0859   AST 20 02/10/2012 1035   AST 19 08/14/2010 1251   ALT 12 09/23/2012 0859   ALT 19 02/10/2012 1035   BILITOT 0.29 09/23/2012 0859   BILITOT 0.4 02/10/2012 1035   BILITOT 0.40 08/14/2010 1251       RADIOGRAPHIC STUDIES: NUCLEAR MEDICINE PET CT SKULL BASE TO THIGH  Technique: Technique: 18.8 mCi F-18 FDG was injected  intravenously. CT data was obtained and used for attenuation    correction and anatomic localization only. (This was not acquired  as a diagnostic CT examination.) Additional exam technical data  entered on technologist worksheet.  Comparison: PET of 02/07/2011. CTs of 06/01/2012.  Findings: Neck: Hypermetabolism within the left side of the  mandible, with suspicion of concurrent periapical focal lucency on  image 24.  Bilateral cervical hypermetabolic lymph nodes. Hypermetabolism  which is primarily felt to be nodal surrounds the right jugular  vein and extends into the thoracic inlet. This measures a S.U.V.  max of 9.9 on image 57  Chest: Hypermetabolic right axillary node. This measures 1.7 cm  and a S.U.V. max of 6.1 on image 82. On the prior PET, this node  measured 1.7 cm and a  S.U.V. max of 3.1.  Small prevascular nodes which are hypermetabolic, including on  image 72.  Abdomen/Pelvis: Right adrenal hypermetabolism which is without CT  correlate. Hypermetabolic left external iliac adenopathy. This  measures 1.3 cm and a S.U.V. max of 5.0 on image 181. On the prior  PET, this node measured 1.6 cm and on the order of a S.U.V. max of  2.6.  Skelton: Multifocal marrow hypermetabolism consistent with osseous  involvement. Index lesion in the right humeral head measures a  S.U.V. max of 14.4 on image 53 and is new.  A hypermetabolic lytic lesion within the left iliac wing is new or  progressive and measures a S.U.V. max of 8.1 on image 155.  CT images performed for attenuation correction demonstrate  increased number and size of lymph nodes within the neck. Index  right jugulodigastric node measures 9 mm short axis on image 34  versus 6 mm on the prior PET. Chest, abdomen, and pelvic findings  deferred to recent diagnostic CTs. Trace right-sided pleural fluid  is new. Left upper lobe radiation fibrosis. Punctate left renal  calculi. Cholelithiasis. Fibroid uterus. L4 osseous metastasis  has possible epidural component on image 144 transverse.   IMPRESSION:  1. Increasing hypermetabolism associated with adenopathy within  the neck, chest, and pelvis, as detailed above. Findings are most  likely indicative of progression of lymphoma.  2. Progressive osseous metastasis, as detailed above. An L4 lesion  has possible epidural component. Consider pre and post contrast  lumbar spine MRI.  3. Hypermetabolism at the right lower neck and thoracic inlet  surrounds the right internal jugular vein. This appears somewhat  hyperattenuating. Cannot exclude thrombus. Consider right upper  extremity venous ultrasound with attention to this area.  4. Likely dental inflammatory left mandibular hypermetabolism.  Consider physical exam correlation.   ASSESSMENT: 77 year old female with  #1Patient with history of low-grade non-Hodgkin lymphoma she initially received CVP Rituxan and then subsequently on maintenance Rituxan, now on R Bendamustine.    #2 metastatic breast carcinoma originally diagnosed in 2009 originally on letrozole 2.5 mg daily, now on Arimidex.  #3 L4 bone metastasis which is painful. Patient is now status post palliative radiation therapy.  #4 H. Pylori  PLAN:  #1 Helen [name] is doing well today.  Her labs are doing well.  She will return next week for labs.  #2 She is taking her Arimidex and tolerating it pretty well.    #3 Her H pylori came back positive. Through multiple discussions with pharmacy and the patient, we prescribed a Prevpac, she's started this and tolerating it well.  There is reportedly a 7% cross-reactivity between PCN and amoxicillin allergies, and she hasn't had a reaction since taking the Amoxicillin.    #4 Her last potassium was 3.7. Will follow up on her level next week..    #5  She is doing well.  She will return next week for interim labs and follow up.    All questions were answered. The patient knows to call the clinic with any problems, questions or concerns. We can certainly see the patient  much sooner if necessary.  I spent 15 minutes counseling the patient face to face. The total time spent in the appointment was 30 minutes.  This case was reviewed with Dr. Welton Flakes.    Cherie Ouch Lyn Hollingshead, NP Medical Oncology [facility] Phone: [phone]   09/29/2012, 9:32 AM

## 2012-09-29 NOTE — Patient Instructions (Addendum)
Doing well.  Continue your medications.  We will see you back next week for your next cycle of chemotherapy.

## 2012-10-07 ENCOUNTER — Ambulatory Visit (HOSPITAL_BASED_OUTPATIENT_CLINIC_OR_DEPARTMENT_OTHER): Payer: Medicare Other | Admitting: Oncology

## 2012-10-07 ENCOUNTER — Ambulatory Visit (HOSPITAL_BASED_OUTPATIENT_CLINIC_OR_DEPARTMENT_OTHER): Payer: Medicare Other

## 2012-10-07 ENCOUNTER — Ambulatory Visit (HOSPITAL_BASED_OUTPATIENT_CLINIC_OR_DEPARTMENT_OTHER): Payer: Medicare Other | Admitting: Lab

## 2012-10-07 ENCOUNTER — Encounter: Payer: Self-pay | Admitting: Oncology

## 2012-10-07 ENCOUNTER — Telehealth: Payer: Self-pay | Admitting: Oncology

## 2012-10-07 ENCOUNTER — Other Ambulatory Visit (HOSPITAL_BASED_OUTPATIENT_CLINIC_OR_DEPARTMENT_OTHER): Payer: Medicare Other | Admitting: Lab

## 2012-10-07 VITALS — BP 116/53 | HR 65 | Temp 97.7°F

## 2012-10-07 VITALS — BP 126/74 | HR 75 | Temp 97.8°F | Resp 20 | Ht 62.0 in | Wt 132.4 lb

## 2012-10-07 DIAGNOSIS — N390 Urinary tract infection, site not specified: Secondary | ICD-10-CM

## 2012-10-07 DIAGNOSIS — E860 Dehydration: Secondary | ICD-10-CM

## 2012-10-07 DIAGNOSIS — C8589 Other specified types of non-Hodgkin lymphoma, extranodal and solid organ sites: Secondary | ICD-10-CM

## 2012-10-07 DIAGNOSIS — Z5112 Encounter for antineoplastic immunotherapy: Secondary | ICD-10-CM

## 2012-10-07 DIAGNOSIS — Z853 Personal history of malignant neoplasm of breast: Secondary | ICD-10-CM

## 2012-10-07 DIAGNOSIS — C7951 Secondary malignant neoplasm of bone: Secondary | ICD-10-CM

## 2012-10-07 DIAGNOSIS — C50919 Malignant neoplasm of unspecified site of unspecified female breast: Secondary | ICD-10-CM

## 2012-10-07 DIAGNOSIS — R300 Dysuria: Secondary | ICD-10-CM

## 2012-10-07 DIAGNOSIS — C8590 Non-Hodgkin lymphoma, unspecified, unspecified site: Secondary | ICD-10-CM

## 2012-10-07 LAB — CBC WITH DIFFERENTIAL/PLATELET
Eosinophils Absolute: 0.3 10*3/uL (ref 0.0–0.5)
HCT: 33 % — ABNORMAL LOW (ref 34.8–46.6)
LYMPH%: 13.6 % — ABNORMAL LOW (ref 14.0–49.7)
MCHC: 31.2 g/dL — ABNORMAL LOW (ref 31.5–36.0)
MCV: 85.1 fL (ref 79.5–101.0)
MONO#: 0.7 10*3/uL (ref 0.1–0.9)
MONO%: 18.1 % — ABNORMAL HIGH (ref 0.0–14.0)
NEUT#: 2.4 10*3/uL (ref 1.5–6.5)
NEUT%: 60.3 % (ref 38.4–76.8)
Platelets: 240 10*3/uL (ref 145–400)
WBC: 4 10*3/uL (ref 3.9–10.3)

## 2012-10-07 LAB — URINALYSIS, MICROSCOPIC - CHCC
Glucose: NEGATIVE g/dL
Ketones: NEGATIVE mg/dL
Nitrite: NEGATIVE
Protein: NEGATIVE mg/dL
RBC / HPF: NEGATIVE (ref 0–2)
Specific Gravity, Urine: 1.005 (ref 1.003–1.035)
WBC, UA: NEGATIVE (ref 0–2)

## 2012-10-07 LAB — COMPREHENSIVE METABOLIC PANEL (CC13)
BUN: 6 mg/dL — ABNORMAL LOW (ref 7.0–26.0)
CO2: 22 mEq/L (ref 22–29)
Calcium: 8.6 mg/dL (ref 8.4–10.4)
Chloride: 111 mEq/L — ABNORMAL HIGH (ref 98–107)
Creatinine: 0.6 mg/dL (ref 0.6–1.1)
Total Bilirubin: 0.23 mg/dL (ref 0.20–1.20)

## 2012-10-07 MED ORDER — ONDANSETRON 8 MG/50ML IVPB (CHCC)
8.0000 mg | Freq: Once | INTRAVENOUS | Status: AC
  Administered 2012-10-07: 8 mg via INTRAVENOUS

## 2012-10-07 MED ORDER — SODIUM CHLORIDE 0.9 % IV SOLN
Freq: Once | INTRAVENOUS | Status: AC
  Administered 2012-10-07: 11:00:00 via INTRAVENOUS

## 2012-10-07 MED ORDER — DIPHENHYDRAMINE HCL 25 MG PO CAPS
50.0000 mg | ORAL_CAPSULE | Freq: Once | ORAL | Status: AC
  Administered 2012-10-07: 50 mg via ORAL

## 2012-10-07 MED ORDER — ACETAMINOPHEN 325 MG PO TABS
650.0000 mg | ORAL_TABLET | Freq: Once | ORAL | Status: AC
  Administered 2012-10-07: 650 mg via ORAL

## 2012-10-07 MED ORDER — SODIUM CHLORIDE 0.9 % IV SOLN
375.0000 mg/m2 | Freq: Once | INTRAVENOUS | Status: AC
  Administered 2012-10-07: 600 mg via INTRAVENOUS
  Filled 2012-10-07: qty 60

## 2012-10-07 MED ORDER — BENDAMUSTINE HCL (LYOPHILIZED PWD) CHEMO INJECTION 100MG
70.0000 mg/m2 | Freq: Once | INTRAVENOUS | Status: AC
  Administered 2012-10-07: 115 mg via INTRAVENOUS
  Filled 2012-10-07: qty 23

## 2012-10-07 MED ORDER — HEPARIN SOD (PORK) LOCK FLUSH 100 UNIT/ML IV SOLN
500.0000 [IU] | Freq: Once | INTRAVENOUS | Status: AC | PRN
  Administered 2012-10-07: 500 [IU]
  Filled 2012-10-07: qty 5

## 2012-10-07 MED ORDER — SODIUM CHLORIDE 0.9 % IJ SOLN
10.0000 mL | INTRAMUSCULAR | Status: DC | PRN
Start: 2012-10-07 — End: 2012-10-07
  Administered 2012-10-07: 10 mL
  Filled 2012-10-07: qty 10

## 2012-10-07 MED ORDER — DEXAMETHASONE SODIUM PHOSPHATE 10 MG/ML IJ SOLN
10.0000 mg | Freq: Once | INTRAMUSCULAR | Status: AC
  Administered 2012-10-07: 10 mg via INTRAVENOUS

## 2012-10-07 NOTE — Telephone Encounter (Signed)
gve the pt her dec,jan 2014 appt calendar. S/w michelle and she added this pt in for her fluids and tx's.

## 2012-10-07 NOTE — Patient Instructions (Addendum)
Proceed with chemotherapy today and on 12/6,   Return on 12/7 for neulasta injection

## 2012-10-07 NOTE — Progress Notes (Signed)
OFFICE PROGRESS NOTE  CC  [name], MD [address] [facility] [address] [place], [address] [place] [address]  DIAGNOSIS: 77 year old female with:  #1 left breast carcinoma originally presenting as a fungating mass. Patient underwent neoadjuvant chemotherapy followed by mastectomy. She is currently on Arimidex 1mg  daily.   #2 low-grade non-Hodgkin lymphoma status post CVP and Rituxan now receiving R-Bendamustine.   PRIOR THERAPY:  #1 the patient was diagnosed with metastatic breast carcinoma beginning October 2009. She returned her 1 chemotherapy followed by mastectomy radiation. She was on letrozole 2.5 mg daily, but has changed to Arimidex 1mg  daily.    #2 Was diagnosed with low-grade non-Hodgkin lymphoma  12. She received 6 cycles of CVP and Rituxan, followed by maintenance Rituxan, who recurred and is now on R-Bendamustine.    CURRENT THERAPY:  Cycle #3 of R bendamustine days one through 3.  INTERVAL HISTORY: Helen [name] 77 y.o. female returns today prior to her treatment today. She seems to be doing well. She really has no complaints other than some burning micturition. We will check a urinalysis on her. If she has a UTI we'll give her some antibiotics. She otherwise denies any fevers chills nausea vomiting no abdominal pain no chest pains no epigastric tenderness or burning. No bleeding. No diarrhea or constipation. Remainder of the 10 point review of systems is negative.  MEDICAL HISTORY: Past Medical History  Diagnosis Date  . Arthritis   . Blood transfusion 2009  . Breast CA 08/2008    (LT) breast ca dx 10/09/Chemo  . Lymphoma 09/09/2011    NHL  . Leukemia, acute, in remission 2012    Pt. not sure of type  . Hypertension   . Seizures 1980's    from medication reaction/Pt.  [name] Hx of radiation therapy 09/11/08 -10/31/08    left breast  . Metastasis to bone 07/03/2012    MRI L spine  . History of radiation therapy eot 07/30/12    lumbar spine L4 /l shoulder     ALLERGIES:  is allergic to codeine; penicillins; sulfonamide derivatives; tape; and tramadol hcl.  MEDICATIONS:  Current Outpatient Prescriptions  Medication Sig Dispense Refill  . amoxicillin (AMOXIL) 500 MG capsule Take 1 capsule (500 mg total) by mouth 3 (three) times daily.  42 capsule  0  . anastrozole (ARIMIDEX) 1 MG tablet Take 1 tablet (1 mg total) by mouth daily.  30 tablet  6  . metroNIDAZOLE (FLAGYL) 500 MG tablet Take 1 tablet (500 mg total) by mouth 3 (three) times daily.  42 tablet  0  . calcium-vitamin D (OSCAL WITH D) 500-200 MG-UNIT per tablet Take 2 tablets by mouth daily.      [name] omeprazole (PRILOSEC) 40 MG capsule Take 1 capsule (40 mg total) by mouth daily.  30 capsule  2  . oxycodone (OXY-IR) 5 MG capsule Take 1 capsule (5 mg total) by mouth every 4 (four) hours as needed.  90 capsule  0  . potassium chloride SA (K-DUR,KLOR-CON) 20 MEQ tablet Take 1 tablet (20 mEq total) by mouth daily.  30 tablet  0    SURGICAL HISTORY:  Past Surgical History  Procedure Date  . Mastectomy   . Thyroidectomy 1965  . Appendectomy   . Tonsillectomy     REVIEW OF SYSTEMS:  General: fatigue (-), night sweats (-), fever (-), pain (-) Lymph: palpable nodes (-) HEENT: vision changes (-), mucositis (-), gum bleeding (-), epistaxis (-) Cardiovascular: chest pain (-), palpitations (-)  Pulmonary: shortness of breath (-), dyspnea on exertion (-), cough (-), hemoptysis (-) GI:  Early satiety (-), melena (-), dysphagia (-), nausea/vomiting (-), diarrhea (-) GU: dysuria (-), hematuria (-), incontinence (-) Musculoskeletal: joint swelling (-), joint pain (-), back pain (-) Neuro: weakness (-), numbness (-), headache (-), confusion (-) Skin: Rash (-), lesions (-), dryness (-) Psych: depression (-), suicidal/homicidal ideation (-), feeling of hopelessness (-)   PHYSICAL EXAMINATION:  BP 126/74  Pulse 75  Temp 97.8 F (36.6 C) (Oral)  Resp 20  Ht 5\' 2"  (1.575 m)  Wt 132 lb 6.4 oz  (60.056 kg)  BMI 24.22 kg/m2 General: Patient is a well appearing female in no acute distress HEENT: PERRLA, sclerae anicteric no conjunctival pallor, MMM Neck: supple, no palpable adenopathy Lungs: clear to auscultation bilaterally, no wheezes, rhonchi, or rales Cardiovascular: regular rate rhythm, S1, S2, no murmurs, rubs or gallops Abdomen: Soft, mild epigastric tenderness, non-distended, normoactive bowel sounds, no HSM Extremities: warm and well perfused, no clubbing, cyanosis, or edema Skin: No rashes or lesions Neuro: Non-focal ECOG PERFORMANCE STATUS: 1 - Symptomatic but completely ambulatory  LABORATORY DATA: Lab Results  Component Value Date   WBC 4.0 10/07/2012   HGB 10.3* 10/07/2012   HCT 33.0* 10/07/2012   MCV 85.1 10/07/2012   PLT 240 10/07/2012      Chemistry      Component Value Date/Time   NA 140 09/29/2012 0843   NA 136 07/06/2012 1122   NA 142 06/01/2012 1008   K 4.4 09/29/2012 0843   K 3.8 07/06/2012 1122   K 3.8 06/01/2012 1008   CL 106 09/29/2012 0843   CL 102 07/06/2012 1122   CL 103 06/01/2012 1008   CO2 24 09/29/2012 0843   CO2 20 07/06/2012 1122   CO2 25 06/01/2012 1008   BUN 6.0* 09/29/2012 0843   BUN 8 07/06/2012 1122   BUN 9 06/01/2012 1008   CREATININE 0.7 09/29/2012 0843   CREATININE 0.60 07/06/2012 1122   CREATININE 0.9 06/01/2012 1008      Component Value Date/Time   CALCIUM 9.0 09/29/2012 0843   CALCIUM 9.1 07/06/2012 1122   CALCIUM 9.8 06/01/2012 1008   ALKPHOS 104 09/29/2012 0843   ALKPHOS 69 02/10/2012 1035   ALKPHOS 78 08/14/2010 1251   AST 14 09/29/2012 0843   AST 20 02/10/2012 1035   AST 19 08/14/2010 1251   ALT 13 09/29/2012 0843   ALT 19 02/10/2012 1035   BILITOT 0.26 09/29/2012 0843   BILITOT 0.4 02/10/2012 1035   BILITOT 0.40 08/14/2010 1251       RADIOGRAPHIC STUDIES: NUCLEAR MEDICINE PET CT SKULL BASE TO THIGH  Technique: Technique: 18.8 mCi F-18 FDG was injected  intravenously. CT data was obtained and used for attenuation  correction  and anatomic localization only. (This was not acquired  as a diagnostic CT examination.) Additional exam technical data  entered on technologist worksheet.  Comparison: PET of 02/07/2011. CTs of 06/01/2012.  Findings: Neck: Hypermetabolism within the left side of the  mandible, with suspicion of concurrent periapical focal lucency on  image 24.  Bilateral cervical hypermetabolic lymph nodes. Hypermetabolism  which is primarily felt to be nodal surrounds the right jugular  vein and extends into the thoracic inlet. This measures a S.U.V.  max of 9.9 on image 57  Chest: Hypermetabolic right axillary node. This measures 1.7 cm  and a S.U.V. max of 6.1 on image 82. On the prior PET, this node  measured 1.7 cm and a  S.U.V. max of 3.1.  Small prevascular nodes which are hypermetabolic, including on  image 72.  Abdomen/Pelvis: Right adrenal hypermetabolism which is without CT  correlate. Hypermetabolic left external iliac adenopathy. This  measures 1.3 cm and a S.U.V. max of 5.0 on image 181. On the prior  PET, this node measured 1.6 cm and on the order of a S.U.V. max of  2.6.  Skelton: Multifocal marrow hypermetabolism consistent with osseous  involvement. Index lesion in the right humeral head measures a  S.U.V. max of 14.4 on image 53 and is new.  A hypermetabolic lytic lesion within the left iliac wing is new or  progressive and measures a S.U.V. max of 8.1 on image 155.  CT images performed for attenuation correction demonstrate  increased number and size of lymph nodes within the neck. Index  right jugulodigastric node measures 9 mm short axis on image 34  versus 6 mm on the prior PET. Chest, abdomen, and pelvic findings  deferred to recent diagnostic CTs. Trace right-sided pleural fluid  is new. Left upper lobe radiation fibrosis. Punctate left renal  calculi. Cholelithiasis. Fibroid uterus. L4 osseous metastasis  has possible epidural component on image 144 transverse.  IMPRESSION:   1. Increasing hypermetabolism associated with adenopathy within  the neck, chest, and pelvis, as detailed above. Findings are most  likely indicative of progression of lymphoma.  2. Progressive osseous metastasis, as detailed above. An L4 lesion  has possible epidural component. Consider pre and post contrast  lumbar spine MRI.  3. Hypermetabolism at the right lower neck and thoracic inlet  surrounds the right internal jugular vein. This appears somewhat  hyperattenuating. Cannot exclude thrombus. Consider right upper  extremity venous ultrasound with attention to this area.  4. Likely dental inflammatory left mandibular hypermetabolism.  Consider physical exam correlation.   ASSESSMENT: 77 year old female with  #1Patient with history of low-grade non-Hodgkin lymphoma she initially received CVP Rituxan and then subsequently on maintenance Rituxan, now on R Bendamustine.    #2 metastatic breast carcinoma originally diagnosed in 2009 originally on letrozole 2.5 mg daily, now on Arimidex.  #3 L4 bone metastasis which is painful. Patient is now status post palliative radiation therapy.  #4 H. Pylori  PLAN:  #1 Ms. Gasiorowski is doing well today.  She will proceed with cycle #3 of her scheduled chemotherapy  #2 She is taking her Arimidex and tolerating it pretty well.    ##3 we will check her urine to rule out urinary tract infection  #4 patient will continue to take her medications for her H. pylori infection.  #5 she will return in one week's time for interim lapse.  #6 we will give her IV fluids on day 2 and day 3 of her chemotherapy and I have also set her up for IV fluids on 10/14/2012   All questions were answered. The patient knows to call the clinic with any problems, questions or concerns. We can certainly see the patient much sooner if necessary.  I spent 25 minutes counseling the patient face to face. The total time spent in the appointment was 30 minutes.  Drue Second, MD Medical/Oncology [facility] [phone] (beeper) [phone] (Office)  10/07/2012, 9:37 AM

## 2012-10-08 ENCOUNTER — Ambulatory Visit (HOSPITAL_BASED_OUTPATIENT_CLINIC_OR_DEPARTMENT_OTHER): Payer: Medicare Other

## 2012-10-08 DIAGNOSIS — C8589 Other specified types of non-Hodgkin lymphoma, extranodal and solid organ sites: Secondary | ICD-10-CM

## 2012-10-08 DIAGNOSIS — Z5111 Encounter for antineoplastic chemotherapy: Secondary | ICD-10-CM

## 2012-10-08 DIAGNOSIS — C8590 Non-Hodgkin lymphoma, unspecified, unspecified site: Secondary | ICD-10-CM

## 2012-10-08 MED ORDER — ONDANSETRON 8 MG/50ML IVPB (CHCC)
8.0000 mg | Freq: Once | INTRAVENOUS | Status: AC
  Administered 2012-10-08: 8 mg via INTRAVENOUS

## 2012-10-08 MED ORDER — SODIUM CHLORIDE 0.9 % IV SOLN
Freq: Once | INTRAVENOUS | Status: AC
  Administered 2012-10-08: 14:00:00 via INTRAVENOUS

## 2012-10-08 MED ORDER — DEXAMETHASONE SODIUM PHOSPHATE 10 MG/ML IJ SOLN
10.0000 mg | Freq: Once | INTRAMUSCULAR | Status: AC
  Administered 2012-10-08: 10 mg via INTRAVENOUS

## 2012-10-08 MED ORDER — SODIUM CHLORIDE 0.9 % IJ SOLN
10.0000 mL | INTRAMUSCULAR | Status: DC | PRN
  Administered 2012-10-08: 10 mL
  Filled 2012-10-08: qty 10

## 2012-10-08 MED ORDER — HEPARIN SOD (PORK) LOCK FLUSH 100 UNIT/ML IV SOLN
500.0000 [IU] | Freq: Once | INTRAVENOUS | Status: AC | PRN
  Administered 2012-10-08: 500 [IU]
  Filled 2012-10-08: qty 5

## 2012-10-08 MED ORDER — SODIUM CHLORIDE 0.9 % IV SOLN
70.0000 mg/m2 | Freq: Once | INTRAVENOUS | Status: AC
  Administered 2012-10-08: 115 mg via INTRAVENOUS
  Filled 2012-10-08: qty 23

## 2012-10-08 NOTE — Patient Instructions (Addendum)
[facility] Discharge Instructions for Patients Receiving Chemotherapy  Today you received the following chemotherapy agents Treanda   To help prevent nausea and vomiting after your treatment, we encourage you to take your nausea medication as directed.   If you develop nausea and vomiting that is not controlled by your nausea medication, call the clinic. If it is after clinic hours your family physician or the after hours number for the clinic or go to the Emergency Department.   BELOW ARE SYMPTOMS THAT SHOULD BE REPORTED IMMEDIATELY:  *FEVER GREATER THAN 100.5 F  *CHILLS WITH OR WITHOUT FEVER  NAUSEA AND VOMITING THAT IS NOT CONTROLLED WITH YOUR NAUSEA MEDICATION  *UNUSUAL SHORTNESS OF BREATH  *UNUSUAL BRUISING OR BLEEDING  TENDERNESS IN MOUTH AND THROAT WITH OR WITHOUT PRESENCE OF ULCERS  *URINARY PROBLEMS  *BOWEL PROBLEMS  UNUSUAL RASH Items with * indicate a potential emergency and should be followed up as soon as possible.  One of the nurses will contact you 24 hours after your treatment. Please let the nurse know about any problems that you may have experienced. Feel free to call the clinic you have any questions or concerns. The clinic phone number is [phone].   I have been informed and understand all the instructions given to me. I know to contact the clinic, my physician, or go to the Emergency Department if any problems should occur. I do not have any questions at this time, but understand that I may call the clinic during office hours   should I have any questions or need assistance in obtaining follow up care.    __________________________________________  _____________  __________ Signature of Patient or Authorized Representative            Date                   Time    __________________________________________ Nurse's Signature    

## 2012-10-09 ENCOUNTER — Ambulatory Visit: Payer: Medicare Other

## 2012-10-09 ENCOUNTER — Ambulatory Visit (HOSPITAL_BASED_OUTPATIENT_CLINIC_OR_DEPARTMENT_OTHER): Payer: Medicare Other

## 2012-10-09 VITALS — BP 144/78 | HR 68 | Temp 97.8°F

## 2012-10-09 DIAGNOSIS — C50919 Malignant neoplasm of unspecified site of unspecified female breast: Secondary | ICD-10-CM

## 2012-10-09 DIAGNOSIS — C8590 Non-Hodgkin lymphoma, unspecified, unspecified site: Secondary | ICD-10-CM

## 2012-10-09 DIAGNOSIS — E860 Dehydration: Secondary | ICD-10-CM

## 2012-10-09 DIAGNOSIS — C7951 Secondary malignant neoplasm of bone: Secondary | ICD-10-CM

## 2012-10-09 DIAGNOSIS — N390 Urinary tract infection, site not specified: Secondary | ICD-10-CM

## 2012-10-09 DIAGNOSIS — C8589 Other specified types of non-Hodgkin lymphoma, extranodal and solid organ sites: Secondary | ICD-10-CM

## 2012-10-09 LAB — URINE CULTURE

## 2012-10-09 MED ORDER — PEGFILGRASTIM INJECTION 6 MG/0.6ML
6.0000 mg | Freq: Once | SUBCUTANEOUS | Status: AC
  Administered 2012-10-09: 6 mg via SUBCUTANEOUS

## 2012-10-09 MED ORDER — SODIUM CHLORIDE 0.9 % IV SOLN
INTRAVENOUS | Status: DC
  Administered 2012-10-09: 09:00:00 via INTRAVENOUS

## 2012-10-09 MED ORDER — HEPARIN SOD (PORK) LOCK FLUSH 100 UNIT/ML IV SOLN
500.0000 [IU] | Freq: Once | INTRAVENOUS | Status: AC
  Administered 2012-10-09: 500 [IU] via INTRAVENOUS
  Filled 2012-10-09: qty 5

## 2012-10-09 MED ORDER — SODIUM CHLORIDE 0.9 % IJ SOLN
10.0000 mL | INTRAMUSCULAR | Status: DC | PRN
  Administered 2012-10-09: 10 mL via INTRAVENOUS
  Filled 2012-10-09: qty 10

## 2012-10-09 NOTE — Patient Instructions (Addendum)
Dehydration, Adult Dehydration is when you lose more fluids from the body than you take in. Vital organs like the kidneys, brain, and heart cannot function without a proper amount of fluids and salt. Any loss of fluids from the body can cause dehydration.  CAUSES   Vomiting.  Diarrhea.  Excessive sweating.  Excessive urine output.  Fever. SYMPTOMS  Mild dehydration  Thirst.  Dry lips.  Slightly dry mouth. Moderate dehydration  Very dry mouth.  Sunken eyes.  Skin does not bounce back quickly when lightly pinched and released.  Dark urine and decreased urine production.  Decreased tear production.  Headache. Severe dehydration  Very dry mouth.  Extreme thirst.  Rapid, weak pulse (more than 100 beats per minute at rest).  Cold hands and feet.  Not able to sweat in spite of heat and temperature.  Rapid breathing.  Blue lips.  Confusion and lethargy.  Difficulty being awakened.  Minimal urine production.  No tears. DIAGNOSIS  Your caregiver will diagnose dehydration based on your symptoms and your exam. Blood and urine tests will help confirm the diagnosis. The diagnostic evaluation should also identify the cause of dehydration. TREATMENT  Treatment of mild or moderate dehydration can often be done at home by increasing the amount of fluids that you drink. It is best to drink small amounts of fluid more often. Drinking too much at one time can make vomiting worse. Refer to the home care instructions below. Severe dehydration needs to be treated at the hospital where you will probably be given intravenous (IV) fluids that contain water and electrolytes. HOME CARE INSTRUCTIONS   Ask your caregiver about specific rehydration instructions.  Drink enough fluids to keep your urine clear or pale yellow.  Drink small amounts frequently if you have nausea and vomiting.  Eat as you normally do.  Avoid:  Foods or drinks high in sugar.  Carbonated  drinks.  Juice.  Extremely hot or cold fluids.  Drinks with caffeine.  Fatty, greasy foods.  Alcohol.  Tobacco.  Overeating.  Gelatin desserts.  Wash your hands well to avoid spreading bacteria and viruses.  Only take over-the-counter or prescription medicines for pain, discomfort, or fever as directed by your caregiver.  Ask your caregiver if you should continue all prescribed and over-the-counter medicines.  Keep all follow-up appointments with your caregiver. SEEK MEDICAL CARE IF:  You have abdominal pain and it increases or stays in one area (localizes).  You have a rash, stiff neck, or severe headache.  You are irritable, sleepy, or difficult to awaken.  You are weak, dizzy, or extremely thirsty. SEEK IMMEDIATE MEDICAL CARE IF:   You are unable to keep fluids down or you get worse despite treatment.  You have frequent episodes of vomiting or diarrhea.  You have blood or green matter (bile) in your vomit.  You have blood in your stool or your stool looks black and tarry.  You have not urinated in 6 to 8 hours, or you have only urinated a small amount of very dark urine.  You have a fever.  You faint. MAKE SURE YOU:   Understand these instructions.  Will watch your condition.  Will get help right away if you are not doing well or get worse. Document Released: 10/20/2005 Document Revised: 01/12/2012 Document Reviewed: 06/09/2011 [employer] Patient Information 2013 [employer].  

## 2012-10-14 ENCOUNTER — Encounter: Payer: Self-pay | Admitting: Adult Health

## 2012-10-14 ENCOUNTER — Telehealth: Payer: Self-pay | Admitting: *Deleted

## 2012-10-14 ENCOUNTER — Other Ambulatory Visit (HOSPITAL_BASED_OUTPATIENT_CLINIC_OR_DEPARTMENT_OTHER): Payer: Medicare Other | Admitting: Lab

## 2012-10-14 ENCOUNTER — Ambulatory Visit (HOSPITAL_BASED_OUTPATIENT_CLINIC_OR_DEPARTMENT_OTHER): Payer: Medicare Other

## 2012-10-14 ENCOUNTER — Telehealth: Payer: Self-pay | Admitting: Medical Oncology

## 2012-10-14 ENCOUNTER — Ambulatory Visit (HOSPITAL_BASED_OUTPATIENT_CLINIC_OR_DEPARTMENT_OTHER): Payer: Medicare Other | Admitting: Adult Health

## 2012-10-14 VITALS — BP 148/76 | HR 77 | Temp 97.5°F | Resp 20 | Ht 62.0 in | Wt 133.6 lb

## 2012-10-14 DIAGNOSIS — C7951 Secondary malignant neoplasm of bone: Secondary | ICD-10-CM

## 2012-10-14 DIAGNOSIS — E860 Dehydration: Secondary | ICD-10-CM

## 2012-10-14 DIAGNOSIS — N390 Urinary tract infection, site not specified: Secondary | ICD-10-CM

## 2012-10-14 DIAGNOSIS — C8590 Non-Hodgkin lymphoma, unspecified, unspecified site: Secondary | ICD-10-CM

## 2012-10-14 DIAGNOSIS — C50919 Malignant neoplasm of unspecified site of unspecified female breast: Secondary | ICD-10-CM

## 2012-10-14 DIAGNOSIS — C8589 Other specified types of non-Hodgkin lymphoma, extranodal and solid organ sites: Secondary | ICD-10-CM

## 2012-10-14 DIAGNOSIS — H539 Unspecified visual disturbance: Secondary | ICD-10-CM

## 2012-10-14 LAB — CBC WITH DIFFERENTIAL/PLATELET
Basophils Absolute: 0.2 10*3/uL — ABNORMAL HIGH (ref 0.0–0.1)
EOS%: 2.3 % (ref 0.0–7.0)
Eosinophils Absolute: 0.8 10*3/uL — ABNORMAL HIGH (ref 0.0–0.5)
HCT: 35.8 % (ref 34.8–46.6)
HGB: 11.4 g/dL — ABNORMAL LOW (ref 11.6–15.9)
LYMPH%: 1.5 % — ABNORMAL LOW (ref 14.0–49.7)
MCH: 27.3 pg (ref 25.1–34.0)
MCV: 85.9 fL (ref 79.5–101.0)
MONO%: 11.7 % (ref 0.0–14.0)
NEUT#: 29 10*3/uL — ABNORMAL HIGH (ref 1.5–6.5)
NEUT%: 84.1 % — ABNORMAL HIGH (ref 38.4–76.8)
Platelets: 220 10*3/uL (ref 145–400)
RDW: 20.4 % — ABNORMAL HIGH (ref 11.2–14.5)

## 2012-10-14 LAB — COMPREHENSIVE METABOLIC PANEL (CC13)
Alkaline Phosphatase: 166 U/L — ABNORMAL HIGH (ref 40–150)
BUN: 6 mg/dL — ABNORMAL LOW (ref 7.0–26.0)
CO2: 28 mEq/L (ref 22–29)
Creatinine: 0.6 mg/dL (ref 0.6–1.1)
Glucose: 99 mg/dl (ref 70–99)
Total Bilirubin: 0.29 mg/dL (ref 0.20–1.20)
Total Protein: 5.9 g/dL — ABNORMAL LOW (ref 6.4–8.3)

## 2012-10-14 MED ORDER — SODIUM CHLORIDE 0.9 % IV SOLN
INTRAVENOUS | Status: DC
  Administered 2012-10-14: 11:00:00 via INTRAVENOUS

## 2012-10-14 MED ORDER — DENOSUMAB 120 MG/1.7ML ~~LOC~~ SOLN
120.0000 mg | Freq: Once | SUBCUTANEOUS | Status: AC
  Administered 2012-10-14: 120 mg via SUBCUTANEOUS
  Filled 2012-10-14: qty 1.7

## 2012-10-14 NOTE — Telephone Encounter (Signed)
Gave patient appointments for 10-2012

## 2012-10-14 NOTE — Telephone Encounter (Signed)
Per NP, called patient to tell her that she needs to restart her potassium at one tab twice a day. Patient stated she has difficulty swallowing them "I will try my best." Had patient verbally tell me back NP's directions. No further questions at this time.

## 2012-10-14 NOTE — Telephone Encounter (Signed)
Made an appointment for the patient at [facility] at [address] [place] [address] they will be sending out paperwork to the patient  11-08-2012 at 1:15pm  

## 2012-10-14 NOTE — Telephone Encounter (Signed)
Made an appointment for the patient at [facility] at [address] [place] [address] they will be sending out paperwork to the patient  11-08-2012 at 1:15pm

## 2012-10-14 NOTE — Telephone Encounter (Signed)
Patient LVOM wishing to speak to Augustin Schooling, NP, no other message stated. NP verbally informed.

## 2012-10-14 NOTE — Patient Instructions (Addendum)
Doing well.  You will receive IV fluids and your Xgeva injection today.  Please call me and let me know if you have any mouth rinse left.  I have referred you to opthalmology for your vision.  I will see you back on 12/19, please call in the meantime if you have any questions or concerns.

## 2012-10-14 NOTE — Patient Instructions (Addendum)
Dehydration, Adult Dehydration is when you lose more fluids from the body than you take in. Vital organs like the kidneys, brain, and heart cannot function without a proper amount of fluids and salt. Any loss of fluids from the body can cause dehydration.  CAUSES   Vomiting.  Diarrhea.  Excessive sweating.  Excessive urine output.  Fever. SYMPTOMS  Mild dehydration  Thirst.  Dry lips.  Slightly dry mouth. Moderate dehydration  Very dry mouth.  Sunken eyes.  Skin does not bounce back quickly when lightly pinched and released.  Dark urine and decreased urine production.  Decreased tear production.  Headache. Severe dehydration  Very dry mouth.  Extreme thirst.  Rapid, weak pulse (more than 100 beats per minute at rest).  Cold hands and feet.  Not able to sweat in spite of heat and temperature.  Rapid breathing.  Blue lips.  Confusion and lethargy.  Difficulty being awakened.  Minimal urine production.  No tears. DIAGNOSIS  Your caregiver will diagnose dehydration based on your symptoms and your exam. Blood and urine tests will help confirm the diagnosis. The diagnostic evaluation should also identify the cause of dehydration. TREATMENT  Treatment of mild or moderate dehydration can often be done at home by increasing the amount of fluids that you drink. It is best to drink small amounts of fluid more often. Drinking too much at one time can make vomiting worse. Refer to the home care instructions below. Severe dehydration needs to be treated at the hospital where you will probably be given intravenous (IV) fluids that contain water and electrolytes. HOME CARE INSTRUCTIONS   Ask your caregiver about specific rehydration instructions.  Drink enough fluids to keep your urine clear or pale yellow.  Drink small amounts frequently if you have nausea and vomiting.  Eat as you normally do.  Avoid:  Foods or drinks high in sugar.  Carbonated  drinks.  Juice.  Extremely hot or cold fluids.  Drinks with caffeine.  Fatty, greasy foods.  Alcohol.  Tobacco.  Overeating.  Gelatin desserts.  Wash your hands well to avoid spreading bacteria and viruses.  Only take over-the-counter or prescription medicines for pain, discomfort, or fever as directed by your caregiver.  Ask your caregiver if you should continue all prescribed and over-the-counter medicines.  Keep all follow-up appointments with your caregiver. SEEK MEDICAL CARE IF:  You have abdominal pain and it increases or stays in one area (localizes).  You have a rash, stiff neck, or severe headache.  You are irritable, sleepy, or difficult to awaken.  You are weak, dizzy, or extremely thirsty. SEEK IMMEDIATE MEDICAL CARE IF:   You are unable to keep fluids down or you get worse despite treatment.  You have frequent episodes of vomiting or diarrhea.  You have blood or green matter (bile) in your vomit.  You have blood in your stool or your stool looks black and tarry.  You have not urinated in 6 to 8 hours, or you have only urinated a small amount of very dark urine.  You have a fever.  You faint. MAKE SURE YOU:   Understand these instructions.  Will watch your condition.  Will get help right away if you are not doing well or get worse. Document Released: 10/20/2005 Document Revised: 01/12/2012 Document Reviewed: 06/09/2011 [facility] Patient Information 2013 [place], [place].   Denosumab injection What is this medicine? DENOSUMAB slows bone breakdown. It is used to treat osteoporosis in women after menopause and in men. This medicine is  also used to prevent bone fractures and other bone problems caused by cancer bone metastases. This medicine may be used for other purposes; ask your health care provider or pharmacist if you have questions. What should I tell my health care provider before I take this medicine? They need to know if you have any of  these conditions: -dental disease -eczema -infection or history of infections -kidney disease or on dialysis -low blood calcium or vitamin D -malabsorption syndrome -scheduled to have surgery or tooth extraction -taking medicine that contains denosumab -thyroid or parathyroid disease -an unusual reaction to denosumab, other medicines, foods, dyes, or preservatives -pregnant or trying to get pregnant -breast-feeding How should I use this medicine? This medicine is for injection under the skin. It is given by a health care professional in a hospital or clinic setting. If you are getting Prolia, a special MedGuide will be given to you by the pharmacist with each prescription and refill. Be sure to read this information carefully each time. Talk to your pediatrician regarding the use of this medicine in children. Special care may be needed. Overdosage: If you think you've taken too much of this medicine contact a poison control center or emergency room at once. Overdosage: If you think you have taken too much of this medicine contact a poison control center or emergency room at once. NOTE: This medicine is only for you. Do not share this medicine with others. What if I miss a dose? It is important not to miss your dose. Call your doctor or health care professional if you are unable to keep an appointment. What may interact with this medicine? Do not take this medicine with any of the following medications: -other medicines containing denosumab This medicine may also interact with the following medications: -medicines that suppress the immune system -medicines that treat cancer -steroid medicines like prednisone or cortisone This list may not describe all possible interactions. Give your health care provider a list of all the medicines, herbs, non-prescription drugs, or dietary supplements you use. Also tell them if you smoke, drink alcohol, or use illegal drugs. Some items may interact with  your medicine. What should I watch for while using this medicine? Visit your doctor or health care professional for regular checks on your progress. Your doctor or health care professional may order blood tests and other tests to see how you are doing. Call your doctor or health care professional if you get a cold or other infection while receiving this medicine. Do not treat yourself. This medicine may decrease your body's ability to fight infection. You should make sure you get enough calcium and vitamin D while you are taking this medicine, unless your doctor tells you not to. Discuss the foods you eat and the vitamins you take with your health care professional. See your dentist regularly. Brush and floss your teeth as directed. Before you have any dental work done, tell your dentist you are receiving this medicine. What side effects may I notice from receiving this medicine? Side effects that you should report to your doctor or health care professional as soon as possible: -allergic reactions like skin rash, itching or hives, swelling of the face, lips, or tongue -breathing problems -chest pain -fast, irregular heartbeat -feeling faint or lightheaded, falls -fever, chills, or any other sign of infection -muscle spasms, tightening, or twitches -numbness or tingling -skin blisters or bumps, or is dry, peels, or red -slow healing or unexplained pain in the mouth or jaw -unusual bleeding or  bruising Side effects that usually do not require medical attention (Report these to your doctor or health care professional if they continue or are bothersome.): -muscle pain -stomach upset, gas This list may not describe all possible side effects. Call your doctor for medical advice about side effects. You may report side effects to FDA at 1-800-FDA-1088. Where should I keep my medicine? This medicine is only given in a clinic, doctor's office, or other health care setting and will not be stored at  home. NOTE: This sheet is a summary. It may not cover all possible information. If you have questions about this medicine, talk to your doctor, pharmacist, or health care provider.  2013, [employer]vier/[employer]. (07/29/2011 3:40:41 PM)

## 2012-10-14 NOTE — Progress Notes (Signed)
OFFICE PROGRESS NOTE  CC  [name], MD [address] [facility] [address] [place], [address] [place] [address]  DIAGNOSIS: 77 year old female with:  #1 left breast carcinoma originally presenting as a fungating mass. Patient underwent neoadjuvant chemotherapy followed by mastectomy. She is currently on Arimidex 1mg  daily.   #2 low-grade non-Hodgkin lymphoma status post CVP and Rituxan now receiving R-Bendamustine.   PRIOR THERAPY:  #1 the patient was diagnosed with metastatic breast carcinoma beginning October 2009. She returned her 1 chemotherapy followed by mastectomy radiation. She was on letrozole 2.5 mg daily, but has changed to Arimidex 1mg  daily.  Also receives Xgeva every 28 days.    #2 Was diagnosed with low-grade non-Hodgkin lymphoma  12. She received 6 cycles of CVP and Rituxan, followed by maintenance Rituxan, who recurred and is now on R-Bendamustine.    CURRENT THERAPY:  Cycle 3 day 8 of R-Bendamustine with Neulasta support.    INTERVAL HISTORY: Helen [name] 77 y.o. female returns for an interim lab and appointment after receiving her third cycle of R-Bendamustine last week.  She's been tolerating her treatment well.  She did have some dysuria last week, and a urinalysis and culture were negative.  The dysuria has now subsided.  She is continuing to take her medications for her H. Pylori infection, and she is tolerating them well.  She does have a hemorrhoid that is bothering her, and is c/o of having a change in vision--her last eye exam was many years ago.  She doesn't have any numbness, tingling, confusion or headaches. Otherwise she is feeling well and w/o questions or concerns.    MEDICAL HISTORY: Past Medical History  Diagnosis Date  . Arthritis   . Blood transfusion 2009  . Breast CA 08/2008    (LT) breast ca dx 10/09/Chemo  . Lymphoma 09/09/2011    NHL  . Leukemia, acute, in remission 2012    Pt. not sure of type  . Hypertension   . Seizures 1980's    from  medication reaction/Pt.  [name] Hx of radiation therapy 09/11/08 -10/31/08    left breast  . Metastasis to bone 07/03/2012    MRI L spine  . History of radiation therapy eot 07/30/12    lumbar spine L4 /l shoulder    ALLERGIES:  is allergic to codeine; penicillins; sulfonamide derivatives; tape; and tramadol hcl.  MEDICATIONS:  Current Outpatient Prescriptions  Medication Sig Dispense Refill  . amoxicillin (AMOXIL) 500 MG capsule Take 1 capsule (500 mg total) by mouth 3 (three) times daily.  42 capsule  0  . anastrozole (ARIMIDEX) 1 MG tablet Take 1 tablet (1 mg total) by mouth daily.  30 tablet  6  . metroNIDAZOLE (FLAGYL) 500 MG tablet Take 1 tablet (500 mg total) by mouth 3 (three) times daily.  42 tablet  0  . oxycodone (OXY-IR) 5 MG capsule Take 1 capsule (5 mg total) by mouth every 4 (four) hours as needed.  90 capsule  0  . calcium-vitamin D (OSCAL WITH D) 500-200 MG-UNIT per tablet Take 2 tablets by mouth daily.      [name] omeprazole (PRILOSEC) 40 MG capsule Take 1 capsule (40 mg total) by mouth daily.  30 capsule  2  . potassium chloride SA (K-DUR,KLOR-CON) 20 MEQ tablet Take 1 tablet (20 mEq total) by mouth daily.  30 tablet  0   No current facility-administered medications for this visit.   Facility-Administered Medications Ordered in Other Visits  Medication Dose Route  Frequency Provider Last Rate Last Dose  . 0.9 %  sodium chloride infusion   Intravenous Continuous Victorino December, MD      . [address] denosumab (XGEVA) injection 120 mg  120 mg Subcutaneous Once Victorino December, MD   120 mg at 10/14/12 1124    SURGICAL HISTORY:  Past Surgical History  Procedure Date  . Mastectomy   . Thyroidectomy 1965  . Appendectomy   . Tonsillectomy     REVIEW OF SYSTEMS:  General: fatigue (-), night sweats (-), fever (-), pain (-) Lymph: palpable nodes (-) HEENT: vision changes (+), mucositis (-), gum bleeding (-), epistaxis (-) Cardiovascular: chest pain (-), palpitations  (-) Pulmonary: shortness of breath (-), dyspnea on exertion (-), cough (-), hemoptysis (-) GI:  Early satiety (-), melena (-), dysphagia (-), nausea/vomiting (-), diarrhea (-) GU: dysuria (-), hematuria (-), incontinence (-) Musculoskeletal: joint swelling (-), joint pain (-), back pain (-) Neuro: weakness (-), numbness (-), headache (-), confusion (-) Skin: Rash (-), lesions (-), dryness (-) Psych: depression (-), suicidal/homicidal ideation (-), feeling of hopelessness (-)   PHYSICAL EXAMINATION:  BP 148/76  Pulse 77  Temp 97.5 F (36.4 C) (Oral)  Resp 20  Ht 5\' 2"  (1.575 m)  Wt 133 lb 9.6 oz (60.601 kg)  BMI 24.44 kg/m2 General: Patient is a well appearing female in no acute distress HEENT: PERRLA, sclerae anicteric no conjunctival pallor, MMM Neck: supple, no palpable adenopathy Lungs: clear to auscultation bilaterally, no wheezes, rhonchi, or rales Cardiovascular: regular rate rhythm, S1, S2, no murmurs, rubs or gallops Abdomen: Soft, mild epigastric tenderness, non-distended, normoactive bowel sounds, no HSM Extremities: warm and well perfused, no clubbing, cyanosis, or edema Skin: No rashes or lesions Neuro: Non-focal ECOG PERFORMANCE STATUS: 1 - Symptomatic but completely ambulatory  LABORATORY DATA: Lab Results  Component Value Date   WBC 34.5* 10/14/2012   HGB 11.4* 10/14/2012   HCT 35.8 10/14/2012   MCV 85.9 10/14/2012   PLT 220 10/14/2012      Chemistry      Component Value Date/Time   NA 141 10/14/2012 0920   NA 136 07/06/2012 1122   NA 142 06/01/2012 1008   K 2.9* 10/14/2012 0920   K 3.8 07/06/2012 1122   K 3.8 06/01/2012 1008   CL 104 10/14/2012 0920   CL 102 07/06/2012 1122   CL 103 06/01/2012 1008   CO2 28 10/14/2012 0920   CO2 20 07/06/2012 1122   CO2 25 06/01/2012 1008   BUN 6.0* 10/14/2012 0920   BUN 8 07/06/2012 1122   BUN 9 06/01/2012 1008   CREATININE 0.6 10/14/2012 0920   CREATININE 0.60 07/06/2012 1122   CREATININE 0.9 06/01/2012 1008       Component Value Date/Time   CALCIUM 9.2 10/14/2012 0920   CALCIUM 9.1 07/06/2012 1122   CALCIUM 9.8 06/01/2012 1008   ALKPHOS 166* 10/14/2012 0920   ALKPHOS 69 02/10/2012 1035   ALKPHOS 78 08/14/2010 1251   AST 13 10/14/2012 0920   AST 20 02/10/2012 1035   AST 19 08/14/2010 1251   ALT 11 10/14/2012 0920   ALT 19 02/10/2012 1035   BILITOT 0.29 10/14/2012 0920   BILITOT 0.4 02/10/2012 1035   BILITOT 0.40 08/14/2010 1251       RADIOGRAPHIC STUDIES: NUCLEAR MEDICINE PET CT SKULL BASE TO THIGH  Technique: Technique: 18.8 mCi F-18 FDG was injected  intravenously. CT data was obtained and used for attenuation  correction and anatomic localization only. (This was not acquired  as a diagnostic CT examination.) Additional exam technical data  entered on technologist worksheet.  Comparison: PET of 02/07/2011. CTs of 06/01/2012.  Findings: Neck: Hypermetabolism within the left side of the  mandible, with suspicion of concurrent periapical focal lucency on  image 24.  Bilateral cervical hypermetabolic lymph nodes. Hypermetabolism  which is primarily felt to be nodal surrounds the right jugular  vein and extends into the thoracic inlet. This measures a S.U.V.  max of 9.9 on image 57  Chest: Hypermetabolic right axillary node. This measures 1.7 cm  and a S.U.V. max of 6.1 on image 82. On the prior PET, this node  measured 1.7 cm and a S.U.V. max of 3.1.  Small prevascular nodes which are hypermetabolic, including on  image 72.  Abdomen/Pelvis: Right adrenal hypermetabolism which is without CT  correlate. Hypermetabolic left external iliac adenopathy. This  measures 1.3 cm and a S.U.V. max of 5.0 on image 181. On the prior  PET, this node measured 1.6 cm and on the order of a S.U.V. max of  2.6.  [place]: Multifocal marrow hypermetabolism consistent with osseous  involvement. Index lesion in the right humeral head measures a  S.U.V. max of 14.4 on image 53 and is new.  A hypermetabolic lytic  lesion within the left iliac wing is new or  progressive and measures a S.U.V. max of 8.1 on image 155.  CT images performed for attenuation correction demonstrate  increased number and size of lymph nodes within the neck. Index  right jugulodigastric node measures 9 mm short axis on image 34  versus 6 mm on the prior PET. Chest, abdomen, and pelvic findings  deferred to recent diagnostic CTs. Trace right-sided pleural fluid  is new. Left upper lobe radiation fibrosis. Punctate left renal  calculi. Cholelithiasis. Fibroid uterus. L4 osseous metastasis  has possible epidural component on image 144 transverse.  IMPRESSION:  1. Increasing hypermetabolism associated with adenopathy within  the neck, chest, and pelvis, as detailed above. Findings are most  likely indicative of progression of lymphoma.  2. Progressive osseous metastasis, as detailed above. An L4 lesion  has possible epidural component. Consider pre and post contrast  lumbar spine MRI.  3. Hypermetabolism at the right lower neck and thoracic inlet  surrounds the right internal jugular vein. This appears somewhat  hyperattenuating. Cannot exclude thrombus. Consider right upper  extremity venous ultrasound with attention to this area.  4. Likely dental inflammatory left mandibular hypermetabolism.  Consider physical exam correlation.   ASSESSMENT: 77 year old female with  #1Patient with history of low-grade non-Hodgkin lymphoma she initially received CVP Rituxan and then subsequently on maintenance Rituxan, now on R Bendamustine.    #2 metastatic breast carcinoma originally diagnosed in 2009 originally on letrozole 2.5 mg daily, now on Arimidex.  #3 L4 bone metastasis which is painful. Patient is now status post palliative radiation therapy.  Also receiving Xgeva q28 days.    #4 H. Pylori  #5 Vision changes  PLAN:  #1 Helen [name] is doing well today.  She tolerated cycle 3 of R-Bendamustine very well.  She was slightly  dehydrated last week, so she will receive IV fluids today as well.  I recommended Preparation H for her hemorrhoid.    #2 She is taking her Arimidex and tolerating it pretty well.  She will receive her Rivka Barbara today.    #3 I have referred her to opthalmology for her vision.    #4 patient will continue to take her medications for her H. pylori  infection.  #5 she will return in one week's time for interim labs.  All questions were answered. The patient knows to call the clinic with any problems, questions or concerns. We can certainly see the patient much sooner if necessary.  I spent 25 minutes counseling the patient face to face. The total time spent in the appointment was 30 minutes.  This case was reviewed with Dr. Welton Flakes.    Cherie Ouch Lyn Hollingshead, NP Medical Oncology [facility] Phone: [phone]    10/14/2012, 12:55 PM

## 2012-10-15 ENCOUNTER — Ambulatory Visit: Payer: Medicare Other

## 2012-10-21 ENCOUNTER — Other Ambulatory Visit (HOSPITAL_BASED_OUTPATIENT_CLINIC_OR_DEPARTMENT_OTHER): Payer: Medicare Other | Admitting: Lab

## 2012-10-21 ENCOUNTER — Ambulatory Visit (HOSPITAL_BASED_OUTPATIENT_CLINIC_OR_DEPARTMENT_OTHER): Payer: Medicare Other | Admitting: Adult Health

## 2012-10-21 ENCOUNTER — Telehealth: Payer: Self-pay | Admitting: *Deleted

## 2012-10-21 ENCOUNTER — Encounter: Payer: Self-pay | Admitting: Adult Health

## 2012-10-21 VITALS — BP 143/77 | HR 71 | Temp 97.5°F | Resp 20 | Ht 62.0 in | Wt 132.6 lb

## 2012-10-21 DIAGNOSIS — C8589 Other specified types of non-Hodgkin lymphoma, extranodal and solid organ sites: Secondary | ICD-10-CM

## 2012-10-21 DIAGNOSIS — C7951 Secondary malignant neoplasm of bone: Secondary | ICD-10-CM

## 2012-10-21 DIAGNOSIS — C8590 Non-Hodgkin lymphoma, unspecified, unspecified site: Secondary | ICD-10-CM

## 2012-10-21 DIAGNOSIS — C50919 Malignant neoplasm of unspecified site of unspecified female breast: Secondary | ICD-10-CM

## 2012-10-21 DIAGNOSIS — A048 Other specified bacterial intestinal infections: Secondary | ICD-10-CM

## 2012-10-21 LAB — COMPREHENSIVE METABOLIC PANEL (CC13)
AST: 14 U/L (ref 5–34)
Alkaline Phosphatase: 104 U/L (ref 40–150)
BUN: 6 mg/dL — ABNORMAL LOW (ref 7.0–26.0)
Creatinine: 0.6 mg/dL (ref 0.6–1.1)
Glucose: 93 mg/dl (ref 70–99)
Potassium: 4 mEq/L (ref 3.5–5.1)
Total Bilirubin: 0.25 mg/dL (ref 0.20–1.20)

## 2012-10-21 LAB — CBC WITH DIFFERENTIAL/PLATELET
Basophils Absolute: 0 10*3/uL (ref 0.0–0.1)
Eosinophils Absolute: 0.3 10*3/uL (ref 0.0–0.5)
HGB: 11.8 g/dL (ref 11.6–15.9)
LYMPH%: 3.7 % — ABNORMAL LOW (ref 14.0–49.7)
MCV: 86.5 fL (ref 79.5–101.0)
MONO#: 0.9 10*3/uL (ref 0.1–0.9)
MONO%: 14.1 % — ABNORMAL HIGH (ref 0.0–14.0)
NEUT#: 4.7 10*3/uL (ref 1.5–6.5)
Platelets: 209 10*3/uL (ref 145–400)
RBC: 4.16 10*6/uL (ref 3.70–5.45)
RDW: 21.7 % — ABNORMAL HIGH (ref 11.2–14.5)
WBC: 6.1 10*3/uL (ref 3.9–10.3)

## 2012-10-21 NOTE — Telephone Encounter (Signed)
Message copied by Cooper Render on Thu Oct 21, 2012  2:47 PM ------      Message from: Laural Golden      Created: Thu Oct 21, 2012  2:20 PM       Please tell Ms. Brickner to only take one potassium pill daily instead of 2.        ----- Message -----         From: [facility] Interface         Sent: 10/21/2012   9:15 AM           To: Victorino December, MD

## 2012-10-21 NOTE — Telephone Encounter (Signed)
Per NP, Instructed pt to take 1 potassium pill daily instead of 2.

## 2012-10-21 NOTE — Progress Notes (Signed)
OFFICE PROGRESS NOTE  CC  [name], MD [address] [facility] [address] [place], [address]st [address] [place] [address]  DIAGNOSIS: 77 year old female with:  #1 left breast carcinoma originally presenting as a fungating mass. Patient underwent neoadjuvant chemotherapy followed by mastectomy. She is currently on Arimidex 1mg  daily.   #2 low-grade non-Hodgkin lymphoma status post CVP and Rituxan now receiving R-Bendamustine.   PRIOR THERAPY:  #1 the patient was diagnosed with metastatic breast carcinoma beginning October 2009. She returned her 1 chemotherapy followed by mastectomy radiation. She was on letrozole 2.5 mg daily, but has changed to Arimidex 1mg  daily.  Also receives Xgeva every 28 days.    #2 Was diagnosed with low-grade non-Hodgkin lymphoma  12. She received 6 cycles of CVP and Rituxan, followed by maintenance Rituxan, who recurred and is now on R-Bendamustine.    CURRENT THERAPY:  Cycle 3 day 15 of R-Bendamustine with Neulasta support.    INTERVAL HISTORY: Helen [name] 77 y.o. female returns for an interim lab and appointment after receiving her third cycle of R-Bendamustine two weeks ago.  She's feeling well, and tolerating her medications without any problems.  Her potassium was low last week and she restarted her Kdur.  She has an occasional runny nose.  Her opthalmology appt is on 11/09/12.  She is without any other questions/concerns and a 10 point ROS is neg.     MEDICAL HISTORY: Past Medical History  Diagnosis Date  . Arthritis   . Blood transfusion 2009  . Breast CA 08/2008    (LT) breast ca dx 10/09/Chemo  . Lymphoma 09/09/2011    NHL  . Leukemia, acute, in remission 2012    Pt. not sure of type  . Hypertension   . Seizures 1980's    from medication reaction/Pt.  [name] Hx of radiation therapy 09/11/08 -10/31/08    left breast  . Metastasis to bone 07/03/2012    MRI L spine  . History of radiation therapy eot 07/30/12    lumbar spine L4 /l shoulder    ALLERGIES:  is  allergic to codeine; penicillins; sulfonamide derivatives; tape; and tramadol hcl.  MEDICATIONS:  Current Outpatient Prescriptions  Medication Sig Dispense Refill  . amoxicillin (AMOXIL) 500 MG capsule Take 1 capsule (500 mg total) by mouth 3 (three) times daily.  42 capsule  0  . anastrozole (ARIMIDEX) 1 MG tablet Take 1 tablet (1 mg total) by mouth daily.  30 tablet  6  . metroNIDAZOLE (FLAGYL) 500 MG tablet Take 1 tablet (500 mg total) by mouth 3 (three) times daily.  42 tablet  0  . oxycodone (OXY-IR) 5 MG capsule Take 1 capsule (5 mg total) by mouth every 4 (four) hours as needed.  90 capsule  0  . potassium chloride SA (K-DUR,KLOR-CON) 20 MEQ tablet Take 1 tablet (20 mEq total) by mouth daily.  30 tablet  0  . calcium-vitamin D (OSCAL WITH D) 500-200 MG-UNIT per tablet Take 2 tablets by mouth daily.      [name] omeprazole (PRILOSEC) 40 MG capsule Take 1 capsule (40 mg total) by mouth daily.  30 capsule  2    SURGICAL HISTORY:  Past Surgical History  Procedure Date  . Mastectomy   . Thyroidectomy 1965  . Appendectomy   . Tonsillectomy     REVIEW OF SYSTEMS:  General: fatigue (-), night sweats (-), fever (-), pain (-) Lymph: palpable nodes (-) HEENT: vision changes (-), mucositis (-), gum bleeding (-), epistaxis (-) Cardiovascular:  chest pain (-), palpitations (-) Pulmonary: shortness of breath (-), dyspnea on exertion (-), cough (-), hemoptysis (-) GI:  Early satiety (-), melena (-), dysphagia (-), nausea/vomiting (-), diarrhea (-) GU: dysuria (-), hematuria (-), incontinence (-) Musculoskeletal: joint swelling (-), joint pain (-), back pain (-) Neuro: weakness (-), numbness (-), headache (-), confusion (-) Skin: Rash (-), lesions (-), dryness (-) Psych: depression (-), suicidal/homicidal ideation (-), feeling of hopelessness (-)   PHYSICAL EXAMINATION:  BP 143/77  Pulse 71  Temp 97.5 F (36.4 C) (Oral)  Resp 20  Ht 5\' 2"  (1.575 m)  Wt 132 lb 9.6 oz (60.147 kg)  BMI 24.25  kg/m2 General: Patient is a well appearing female in no acute distress HEENT: PERRLA, sclerae anicteric no conjunctival pallor, MMM, Tympanic membranes opaque, +BLM, no erythema or exudate, turbinates non-erythematous, no exudate.   Neck: supple, no palpable adenopathy Lungs: clear to auscultation bilaterally, no wheezes, rhonchi, or rales Cardiovascular: regular rate rhythm, S1, S2, no murmurs, rubs or gallops Abdomen: Soft, mild epigastric tenderness, non-distended, normoactive bowel sounds, no HSM Extremities: warm and well perfused, no clubbing, cyanosis, or edema Skin: No rashes or lesions Neuro: Non-focal ECOG PERFORMANCE STATUS: 1 - Symptomatic but completely ambulatory  LABORATORY DATA: Lab Results  Component Value Date   WBC 6.1 10/21/2012   HGB 11.8 10/21/2012   HCT 36.0 10/21/2012   MCV 86.5 10/21/2012   PLT 209 10/21/2012      Chemistry      Component Value Date/Time   NA 140 10/21/2012 0902   NA 136 07/06/2012 1122   NA 142 06/01/2012 1008   K 4.0 10/21/2012 0902   K 3.8 07/06/2012 1122   K 3.8 06/01/2012 1008   CL 107 10/21/2012 0902   CL 102 07/06/2012 1122   CL 103 06/01/2012 1008   CO2 25 10/21/2012 0902   CO2 20 07/06/2012 1122   CO2 25 06/01/2012 1008   BUN 6.0* 10/21/2012 0902   BUN 8 07/06/2012 1122   BUN 9 06/01/2012 1008   CREATININE 0.6 10/21/2012 0902   CREATININE 0.60 07/06/2012 1122   CREATININE 0.9 06/01/2012 1008      Component Value Date/Time   CALCIUM 8.8 10/21/2012 0902   CALCIUM 9.1 07/06/2012 1122   CALCIUM 9.8 06/01/2012 1008   ALKPHOS 104 10/21/2012 0902   ALKPHOS 69 02/10/2012 1035   ALKPHOS 78 08/14/2010 1251   AST 14 10/21/2012 0902   AST 20 02/10/2012 1035   AST 19 08/14/2010 1251   ALT 11 10/21/2012 0902   ALT 19 02/10/2012 1035   BILITOT 0.25 10/21/2012 0902   BILITOT 0.4 02/10/2012 1035   BILITOT 0.40 08/14/2010 1251       RADIOGRAPHIC STUDIES: NUCLEAR MEDICINE PET CT SKULL BASE TO THIGH  Technique: Technique: 18.8 mCi F-18 FDG was  injected  intravenously. CT data was obtained and used for attenuation  correction and anatomic localization only. (This was not acquired  as a diagnostic CT examination.) Additional exam technical data  entered on technologist worksheet.  Comparison: PET of 02/07/2011. CTs of 06/01/2012.  Findings: Neck: Hypermetabolism within the left side of the  mandible, with suspicion of concurrent periapical focal lucency on  image 24.  Bilateral cervical hypermetabolic lymph nodes. Hypermetabolism  which is primarily felt to be nodal surrounds the right jugular  vein and extends into the thoracic inlet. This measures a S.U.V.  max of 9.9 on image 57  Chest: Hypermetabolic right axillary node. This measures 1.7 cm  and a  S.U.V. max of 6.1 on image 82. On the prior PET, this node  measured 1.7 cm and a S.U.V. max of 3.1.  Small prevascular nodes which are hypermetabolic, including on  image 72.  Abdomen/Pelvis: Right adrenal hypermetabolism which is without CT  correlate. Hypermetabolic left external iliac adenopathy. This  measures 1.3 cm and a S.U.V. max of 5.0 on image 181. On the prior  PET, this node measured 1.6 cm and on the order of a S.U.V. max of  2.6.  Skelton: Multifocal marrow hypermetabolism consistent with osseous  involvement. Index lesion in the right humeral head measures a  S.U.V. max of 14.4 on image 53 and is new.  A hypermetabolic lytic lesion within the left iliac wing is new or  progressive and measures a S.U.V. max of 8.1 on image 155.  CT images performed for attenuation correction demonstrate  increased number and size of lymph nodes within the neck. Index  right jugulodigastric node measures 9 mm short axis on image 34  versus 6 mm on the prior PET. Chest, abdomen, and pelvic findings  deferred to recent diagnostic CTs. Trace right-sided pleural fluid  is new. Left upper lobe radiation fibrosis. Punctate left renal  calculi. Cholelithiasis. Fibroid uterus. L4  osseous metastasis  has possible epidural component on image 144 transverse.  IMPRESSION:  1. Increasing hypermetabolism associated with adenopathy within  the neck, chest, and pelvis, as detailed above. Findings are most  likely indicative of progression of lymphoma.  2. Progressive osseous metastasis, as detailed above. An L4 lesion  has possible epidural component. Consider pre and post contrast  lumbar spine MRI.  3. Hypermetabolism at the right lower neck and thoracic inlet  surrounds the right internal jugular vein. This appears somewhat  hyperattenuating. Cannot exclude thrombus. Consider right upper  extremity venous ultrasound with attention to this area.  4. Likely dental inflammatory left mandibular hypermetabolism.  Consider physical exam correlation.   ASSESSMENT: 77 year old female with  #1Patient with history of low-grade non-Hodgkin lymphoma she initially received CVP Rituxan and then subsequently on maintenance Rituxan, now on R Bendamustine.    #2 metastatic breast carcinoma originally diagnosed in 2009 originally on letrozole 2.5 mg daily, now on Arimidex.  #3 L4 bone metastasis which is painful. Patient is now status post palliative radiation therapy.  Also receiving Xgeva q28 days.    #4 H. Pylori  #5 Vision changes  PLAN:  #1 Helen [name] is doing well today.  She tolerated cycle 3 of R-Bendamustine very well.  Her dehydration is much improved.    #2 She is taking her Arimidex and tolerating it pretty well.  She will receive her Xgeva in 3 weeks.    #3 I have referred her to opthalmology for her vision.  Her appt is scheduled for the beginning of January.    #4 patient will continue to take her medications for her H. pylori infection.  #5 she will return in two weeks for her next cycle of chemo.    All questions were answered. The patient knows to call the clinic with any problems, questions or concerns. We can certainly see the patient much sooner if  necessary.  I spent 25 minutes counseling the patient face to face. The total time spent in the appointment was 30 minutes.  This case was reviewed with Dr. Welton Flakes.    Cherie Ouch Lyn Hollingshead, NP Medical Oncology [facility] Phone: [phone] 10/21/2012, 10:35 AM

## 2012-10-21 NOTE — Telephone Encounter (Signed)
LA or KK 1/9, 1/16

## 2012-10-21 NOTE — Patient Instructions (Addendum)
Doing well.  Please call if you continue to have a runny nose or have any other questions/concerns.  We will see you back on 11/04/12.

## 2012-11-04 ENCOUNTER — Ambulatory Visit (HOSPITAL_BASED_OUTPATIENT_CLINIC_OR_DEPARTMENT_OTHER): Payer: Medicare Other

## 2012-11-04 ENCOUNTER — Telehealth: Payer: Self-pay | Admitting: Medical Oncology

## 2012-11-04 ENCOUNTER — Ambulatory Visit (HOSPITAL_BASED_OUTPATIENT_CLINIC_OR_DEPARTMENT_OTHER): Payer: Medicare Other | Admitting: Adult Health

## 2012-11-04 ENCOUNTER — Ambulatory Visit: Payer: Medicare Other | Admitting: Adult Health

## 2012-11-04 ENCOUNTER — Encounter: Payer: Self-pay | Admitting: Adult Health

## 2012-11-04 ENCOUNTER — Other Ambulatory Visit (HOSPITAL_BASED_OUTPATIENT_CLINIC_OR_DEPARTMENT_OTHER): Payer: Medicare Other | Admitting: Lab

## 2012-11-04 ENCOUNTER — Other Ambulatory Visit: Payer: Medicare Other | Admitting: Lab

## 2012-11-04 VITALS — BP 143/80 | HR 73 | Temp 97.0°F | Resp 20 | Ht 62.0 in | Wt 131.6 lb

## 2012-11-04 VITALS — BP 119/46 | HR 63 | Temp 96.8°F | Resp 20

## 2012-11-04 DIAGNOSIS — Z5111 Encounter for antineoplastic chemotherapy: Secondary | ICD-10-CM

## 2012-11-04 DIAGNOSIS — C8590 Non-Hodgkin lymphoma, unspecified, unspecified site: Secondary | ICD-10-CM

## 2012-11-04 DIAGNOSIS — H538 Other visual disturbances: Secondary | ICD-10-CM

## 2012-11-04 DIAGNOSIS — Z5112 Encounter for antineoplastic immunotherapy: Secondary | ICD-10-CM

## 2012-11-04 DIAGNOSIS — C50919 Malignant neoplasm of unspecified site of unspecified female breast: Secondary | ICD-10-CM

## 2012-11-04 DIAGNOSIS — C8589 Other specified types of non-Hodgkin lymphoma, extranodal and solid organ sites: Secondary | ICD-10-CM

## 2012-11-04 DIAGNOSIS — C7952 Secondary malignant neoplasm of bone marrow: Secondary | ICD-10-CM

## 2012-11-04 LAB — CBC WITH DIFFERENTIAL/PLATELET
Basophils Absolute: 0 10*3/uL (ref 0.0–0.1)
EOS%: 7.2 % — ABNORMAL HIGH (ref 0.0–7.0)
Eosinophils Absolute: 0.3 10*3/uL (ref 0.0–0.5)
HCT: 37.7 % (ref 34.8–46.6)
HGB: 11.9 g/dL (ref 11.6–15.9)
MCH: 27.9 pg (ref 25.1–34.0)
MCV: 88.3 fL (ref 79.5–101.0)
MONO%: 11.6 % (ref 0.0–14.0)
NEUT#: 3.2 10*3/uL (ref 1.5–6.5)
NEUT%: 68.3 % (ref 38.4–76.8)
Platelets: 212 10*3/uL (ref 145–400)

## 2012-11-04 LAB — COMPREHENSIVE METABOLIC PANEL (CC13)
Albumin: 3.4 g/dL — ABNORMAL LOW (ref 3.5–5.0)
Alkaline Phosphatase: 67 U/L (ref 40–150)
BUN: 7 mg/dL (ref 7.0–26.0)
CO2: 21 mEq/L — ABNORMAL LOW (ref 22–29)
Glucose: 98 mg/dl (ref 70–99)
Sodium: 139 mEq/L (ref 136–145)
Total Bilirubin: <0.2 mg/dL (ref 0.20–1.20)
Total Protein: 5.9 g/dL — ABNORMAL LOW (ref 6.4–8.3)

## 2012-11-04 MED ORDER — SODIUM CHLORIDE 0.9 % IV SOLN
70.0000 mg/m2 | Freq: Once | INTRAVENOUS | Status: AC
  Administered 2012-11-04: 115 mg via INTRAVENOUS
  Filled 2012-11-04: qty 23

## 2012-11-04 MED ORDER — SODIUM CHLORIDE 0.9 % IJ SOLN
10.0000 mL | INTRAMUSCULAR | Status: DC | PRN
  Administered 2012-11-04: 10 mL
  Filled 2012-11-04: qty 10

## 2012-11-04 MED ORDER — ACETAMINOPHEN 325 MG PO TABS
650.0000 mg | ORAL_TABLET | Freq: Once | ORAL | Status: AC
  Administered 2012-11-04: 650 mg via ORAL

## 2012-11-04 MED ORDER — HEPARIN SOD (PORK) LOCK FLUSH 100 UNIT/ML IV SOLN
500.0000 [IU] | Freq: Once | INTRAVENOUS | Status: AC | PRN
  Administered 2012-11-04: 500 [IU]
  Filled 2012-11-04: qty 5

## 2012-11-04 MED ORDER — ONDANSETRON 8 MG/50ML IVPB (CHCC)
8.0000 mg | Freq: Once | INTRAVENOUS | Status: AC
Start: 2012-11-04 — End: 2012-11-04
  Administered 2012-11-04: 8 mg via INTRAVENOUS

## 2012-11-04 MED ORDER — DEXAMETHASONE SODIUM PHOSPHATE 10 MG/ML IJ SOLN
10.0000 mg | Freq: Once | INTRAMUSCULAR | Status: AC
  Administered 2012-11-04: 10 mg via INTRAVENOUS

## 2012-11-04 MED ORDER — SODIUM CHLORIDE 0.9 % IV SOLN
375.0000 mg/m2 | Freq: Once | INTRAVENOUS | Status: AC
  Administered 2012-11-04: 600 mg via INTRAVENOUS
  Filled 2012-11-04: qty 60

## 2012-11-04 MED ORDER — DIPHENHYDRAMINE HCL 25 MG PO CAPS
50.0000 mg | ORAL_CAPSULE | Freq: Once | ORAL | Status: AC
  Administered 2012-11-04: 50 mg via ORAL

## 2012-11-04 MED ORDER — SODIUM CHLORIDE 0.9 % IV SOLN
Freq: Once | INTRAVENOUS | Status: AC
  Administered 2012-11-04: 11:00:00 via INTRAVENOUS

## 2012-11-04 NOTE — Progress Notes (Signed)
OFFICE PROGRESS NOTE  CC  [name], MD [address] [facility] [address] [place], [address]st [address] [place] [address]  DIAGNOSIS: 78 year old female with:  #1 left breast carcinoma originally presenting as a fungating mass. Patient underwent neoadjuvant chemotherapy followed by mastectomy. She is currently on Arimidex 1mg  daily.   #2 low-grade non-Hodgkin lymphoma status post CVP and Rituxan now receiving R-Bendamustine.   PRIOR THERAPY:  #1 the patient was diagnosed with metastatic breast carcinoma beginning October 2009. She returned her 1 chemotherapy followed by mastectomy radiation. She was on letrozole 2.5 mg daily, but has changed to Arimidex 1mg  daily.  Also receives Xgeva every 28 days.    #2 Was diagnosed with low-grade non-Hodgkin lymphoma  12. She received 6 cycles of CVP and Rituxan, followed by maintenance Rituxan, who recurred and is now on R-Bendamustine.    CURRENT THERAPY:  Cycle 4 day 1 of R-Bendamustine with Neulasta support.    INTERVAL HISTORY: Helen [name] 78 y.o. female returns for follow up today.  She is feeling well.  She has no fevers, chills, pain, dark stool, or any questions/concerns.  A 10 pt. ROS is neg.    MEDICAL HISTORY: Past Medical History  Diagnosis Date  . Arthritis   . Blood transfusion 2009  . Breast CA 08/2008    (LT) breast ca dx 10/09/Chemo  . Lymphoma 09/09/2011    NHL  . Leukemia, acute, in remission 2012    Pt. not sure of type  . Hypertension   . Seizures 1980's    from medication reaction/Pt.  [name] Hx of radiation therapy 09/11/08 -10/31/08    left breast  . Metastasis to bone 07/03/2012    MRI L spine  . History of radiation therapy eot 07/30/12    lumbar spine L4 /l shoulder    ALLERGIES:  is allergic to codeine; penicillins; sulfonamide derivatives; tape; and tramadol hcl.  MEDICATIONS:  Current Outpatient Prescriptions  Medication Sig Dispense Refill  . amoxicillin (AMOXIL) 500 MG capsule Take 1 capsule (500 mg total) by  mouth 3 (three) times daily.  42 capsule  0  . anastrozole (ARIMIDEX) 1 MG tablet Take 1 tablet (1 mg total) by mouth daily.  30 tablet  6  . calcium-vitamin D (OSCAL WITH D) 500-200 MG-UNIT per tablet Take 2 tablets by mouth daily.      . metroNIDAZOLE (FLAGYL) 500 MG tablet Take 1 tablet (500 mg total) by mouth 3 (three) times daily.  42 tablet  0  . omeprazole (PRILOSEC) 40 MG capsule Take 1 capsule (40 mg total) by mouth daily.  30 capsule  2  . oxycodone (OXY-IR) 5 MG capsule Take 1 capsule (5 mg total) by mouth every 4 (four) hours as needed.  90 capsule  0  . potassium chloride SA (K-DUR,KLOR-CON) 20 MEQ tablet Take 1 tablet (20 mEq total) by mouth daily.  30 tablet  0   No current facility-administered medications for this visit.   Facility-Administered Medications Ordered in Other Visits  Medication Dose Route Frequency Provider Last Rate Last Dose  . bendamustine (TREANDA) 115 mg in sodium chloride 0.9 % 500 mL chemo infusion  70 mg/m2 (Treatment Plan Actual) Intravenous Once Victorino December, MD      . dexamethasone (DECADRON) injection 10 mg  10 mg Intravenous Once Victorino December, MD      . heparin lock flush 100 unit/mL  500 Units Intracatheter Once PRN Victorino December, MD      .  ondansetron (ZOFRAN) IVPB 8 mg  8 mg Intravenous Once Victorino December, MD      . sodium chloride 0.9 % injection 10 mL  10 mL Intracatheter PRN Victorino December, MD        SURGICAL HISTORY:  Past Surgical History  Procedure Date  . Mastectomy   . Thyroidectomy 1965  . Appendectomy   . Tonsillectomy     REVIEW OF SYSTEMS:  General: fatigue (-), night sweats (-), fever (-), pain (-) Lymph: palpable nodes (-) HEENT: vision changes (-), mucositis (-), gum bleeding (-), epistaxis (-) Cardiovascular: chest pain (-), palpitations (-) Pulmonary: shortness of breath (-), dyspnea on exertion (-), cough (-), hemoptysis (-) GI:  Early satiety (-), melena (-), dysphagia (-), nausea/vomiting (-), diarrhea (-) GU:  dysuria (-), hematuria (-), incontinence (-) Musculoskeletal: joint swelling (-), joint pain (-), back pain (-) Neuro: weakness (-), numbness (-), headache (-), confusion (-) Skin: Rash (-), lesions (-), dryness (-) Psych: depression (-), suicidal/homicidal ideation (-), feeling of hopelessness (-)   PHYSICAL EXAMINATION:  BP 143/80  Pulse 73  Temp 97 F (36.1 C)  Resp 20  Ht 5\' 2"  (1.575 m)  Wt 131 lb 9.6 oz (59.693 kg)  BMI 24.07 kg/m2 General: Patient is a well appearing female in no acute distress HEENT: PERRLA, sclerae anicteric no conjunctival pallor, MMM, Tympanic membranes opaque, +BLM, no erythema or exudate, turbinates non-erythematous, no exudate.   Neck: supple, no palpable adenopathy Lungs: clear to auscultation bilaterally, no wheezes, rhonchi, or rales Cardiovascular: regular rate rhythm, S1, S2, no murmurs, rubs or gallops Abdomen: Soft, mild epigastric tenderness, non-distended, normoactive bowel sounds, no HSM Extremities: warm and well perfused, no clubbing, cyanosis, or edema Skin: No rashes or lesions Neuro: Non-focal ECOG PERFORMANCE STATUS: 1 - Symptomatic but completely ambulatory  LABORATORY DATA: Lab Results  Component Value Date   WBC 4.7 11/04/2012   HGB 11.9 11/04/2012   HCT 37.7 11/04/2012   MCV 88.3 11/04/2012   PLT 212 11/04/2012      Chemistry      Component Value Date/Time   NA 139 11/04/2012 1017   NA 136 07/06/2012 1122   NA 142 06/01/2012 1008   K 4.3 11/04/2012 1017   K 3.8 07/06/2012 1122   K 3.8 06/01/2012 1008   CL 110* 11/04/2012 1017   CL 102 07/06/2012 1122   CL 103 06/01/2012 1008   CO2 21* 11/04/2012 1017   CO2 20 07/06/2012 1122   CO2 25 06/01/2012 1008   BUN 7.0 11/04/2012 1017   BUN 8 07/06/2012 1122   BUN 9 06/01/2012 1008   CREATININE 0.7 11/04/2012 1017   CREATININE 0.60 07/06/2012 1122   CREATININE 0.9 06/01/2012 1008      Component Value Date/Time   CALCIUM 8.6 11/04/2012 1017   CALCIUM 9.1 07/06/2012 1122   CALCIUM 9.8 06/01/2012 1008   ALKPHOS  67 11/04/2012 1017   ALKPHOS 69 02/10/2012 1035   ALKPHOS 78 08/14/2010 1251   AST 19 11/04/2012 1017   AST 20 02/10/2012 1035   AST 19 08/14/2010 1251   ALT 11 11/04/2012 1017   ALT 19 02/10/2012 1035   BILITOT <0.20 Repeated and Verified 11/04/2012 1017   BILITOT 0.4 02/10/2012 1035   BILITOT 0.40 08/14/2010 1251       RADIOGRAPHIC STUDIES: NUCLEAR MEDICINE PET CT SKULL BASE TO THIGH  Technique: Technique: 18.8 mCi F-18 FDG was injected  intravenously. CT data was obtained and used for attenuation  correction and anatomic  localization only. (This was not acquired  as a diagnostic CT examination.) Additional exam technical data  entered on technologist worksheet.  Comparison: PET of 02/07/2011. CTs of 06/01/2012.  Findings: Neck: Hypermetabolism within the left side of the  mandible, with suspicion of concurrent periapical focal lucency on  image 24.  Bilateral cervical hypermetabolic lymph nodes. Hypermetabolism  which is primarily felt to be nodal surrounds the right jugular  vein and extends into the thoracic inlet. This measures a S.U.V.  max of 9.9 on image 57  Chest: Hypermetabolic right axillary node. This measures 1.7 cm  and a S.U.V. max of 6.1 on image 82. On the prior PET, this node  measured 1.7 cm and a S.U.V. max of 3.1.  Small prevascular nodes which are hypermetabolic, including on  image 72.  Abdomen/Pelvis: Right adrenal hypermetabolism which is without CT  correlate. Hypermetabolic left external iliac adenopathy. This  measures 1.3 cm and a S.U.V. max of 5.0 on image 181. On the prior  PET, this node measured 1.6 cm and on the order of a S.U.V. max of  2.6.  Skelton: Multifocal marrow hypermetabolism consistent with osseous  involvement. Index lesion in the right humeral head measures a  S.U.V. max of 14.4 on image 53 and is new.  A hypermetabolic lytic lesion within the left iliac wing is new or  progressive and measures a S.U.V. max of 8.1 on image 155.  CT images  performed for attenuation correction demonstrate  increased number and size of lymph nodes within the neck. Index  right jugulodigastric node measures 9 mm short axis on image 34  versus 6 mm on the prior PET. Chest, abdomen, and pelvic findings  deferred to recent diagnostic CTs. Trace right-sided pleural fluid  is new. Left upper lobe radiation fibrosis. Punctate left renal  calculi. Cholelithiasis. Fibroid uterus. L4 osseous metastasis  has possible epidural component on image 144 transverse.  IMPRESSION:  1. Increasing hypermetabolism associated with adenopathy within  the neck, chest, and pelvis, as detailed above. Findings are most  likely indicative of progression of lymphoma.  2. Progressive osseous metastasis, as detailed above. An L4 lesion  has possible epidural component. Consider pre and post contrast  lumbar spine MRI.  3. Hypermetabolism at the right lower neck and thoracic inlet  surrounds the right internal jugular vein. This appears somewhat  hyperattenuating. Cannot exclude thrombus. Consider right upper  extremity venous ultrasound with attention to this area.  4. Likely dental inflammatory left mandibular hypermetabolism.  Consider physical exam correlation.   ASSESSMENT: 78 year old female with  #1Patient with history of low-grade non-Hodgkin lymphoma she initially received CVP Rituxan and then subsequently on maintenance Rituxan, now on R Bendamustine.    #2 metastatic breast carcinoma originally diagnosed in 2009 originally on letrozole 2.5 mg daily, now on Arimidex.  #3 L4 bone metastasis which is painful. Patient is now status post palliative radiation therapy.  Also receiving Xgeva q28 days.    #4 H. Pylori  #5 Vision changes  PLAN:  #1 Ms. Dai is doing well today.  She will proceed with chemotherapy today.    #2 She is taking her Arimidex and tolerating it pretty well.  She will receive her Xgeva next week.    #3She has completed her H. Pylori  medication.    #4 she will return next week for labs and an interim appt.   All questions were answered. The patient knows to call the clinic with any problems, questions or concerns. We can  certainly see the patient much sooner if necessary.  I spent 25 minutes counseling the patient face to face. The total time spent in the appointment was 30 minutes.  This case was reviewed with Dr. Welton Flakes.    Cherie Ouch Lyn Hollingshead, NP Medical Oncology [facility] Phone: [phone] 11/04/2012, 12:52 PM

## 2012-11-04 NOTE — Patient Instructions (Signed)
Doing well.  Proceed with chemotherapy.  Please call us if you have any questions or concerns.    We will see you back on 11/11/12.  We will give you your xgeva at this time.

## 2012-11-04 NOTE — Patient Instructions (Addendum)
[facility] Discharge Instructions for Patients Receiving Chemotherapy  Today you received the following chemotherapy agents Rituxan and Treanda.  To help prevent nausea and vomiting after your treatment, we encourage you to take your nausea medication as ordered per MD.    If you develop nausea and vomiting that is not controlled by your nausea medication, call the clinic. If it is after clinic hours your family physician or the after hours number for the clinic or go to the Emergency Department.   BELOW ARE SYMPTOMS THAT SHOULD BE REPORTED IMMEDIATELY:  *FEVER GREATER THAN 100.5 F  *CHILLS WITH OR WITHOUT FEVER  NAUSEA AND VOMITING THAT IS NOT CONTROLLED WITH YOUR NAUSEA MEDICATION  *UNUSUAL SHORTNESS OF BREATH  *UNUSUAL BRUISING OR BLEEDING  TENDERNESS IN MOUTH AND THROAT WITH OR WITHOUT PRESENCE OF ULCERS  *URINARY PROBLEMS  *BOWEL PROBLEMS  UNUSUAL RASH Items with * indicate a potential emergency and should be followed up as soon as possible. . Please let the nurse know about any problems that you may have experienced. Feel free to call the clinic you have any questions or concerns. The clinic phone number is [phone].   I have been informed and understand all the instructions given to me. I know to contact the clinic, my physician, or go to the Emergency Department if any problems should occur. I do not have any questions at this time, but understand that I may call the clinic during office hours   should I have any questions or need assistance in obtaining follow up care.

## 2012-11-04 NOTE — Telephone Encounter (Signed)
Per NP, called patient and LVMOM to inform patient she no longer needs to take her Potassium Supplements for the time being. Asked patient to call back to confirm receipt of message.

## 2012-11-05 ENCOUNTER — Ambulatory Visit (HOSPITAL_BASED_OUTPATIENT_CLINIC_OR_DEPARTMENT_OTHER): Payer: Medicare Other

## 2012-11-05 VITALS — BP 148/69 | HR 69 | Temp 97.3°F | Resp 20

## 2012-11-05 DIAGNOSIS — N390 Urinary tract infection, site not specified: Secondary | ICD-10-CM

## 2012-11-05 DIAGNOSIS — C8589 Other specified types of non-Hodgkin lymphoma, extranodal and solid organ sites: Secondary | ICD-10-CM

## 2012-11-05 DIAGNOSIS — C8590 Non-Hodgkin lymphoma, unspecified, unspecified site: Secondary | ICD-10-CM

## 2012-11-05 DIAGNOSIS — Z5111 Encounter for antineoplastic chemotherapy: Secondary | ICD-10-CM

## 2012-11-05 DIAGNOSIS — E860 Dehydration: Secondary | ICD-10-CM

## 2012-11-05 MED ORDER — ONDANSETRON 8 MG/50ML IVPB (CHCC)
8.0000 mg | Freq: Once | INTRAVENOUS | Status: AC
  Administered 2012-11-05: 8 mg via INTRAVENOUS

## 2012-11-05 MED ORDER — SODIUM CHLORIDE 0.9 % IV SOLN
Freq: Once | INTRAVENOUS | Status: AC
  Administered 2012-11-05: 15:00:00 via INTRAVENOUS

## 2012-11-05 MED ORDER — SODIUM CHLORIDE 0.9 % IV SOLN
Freq: Once | INTRAVENOUS | Status: AC
  Administered 2012-11-05: 16:00:00 via INTRAVENOUS

## 2012-11-05 MED ORDER — HEPARIN SOD (PORK) LOCK FLUSH 100 UNIT/ML IV SOLN
500.0000 [IU] | Freq: Once | INTRAVENOUS | Status: AC | PRN
  Administered 2012-11-05: 500 [IU]
  Filled 2012-11-05: qty 5

## 2012-11-05 MED ORDER — SODIUM CHLORIDE 0.9 % IV SOLN
70.0000 mg/m2 | Freq: Once | INTRAVENOUS | Status: AC
  Administered 2012-11-05: 115 mg via INTRAVENOUS
  Filled 2012-11-05: qty 23

## 2012-11-05 MED ORDER — SODIUM CHLORIDE 0.9 % IJ SOLN
10.0000 mL | INTRAMUSCULAR | Status: DC | PRN
  Administered 2012-11-05: 10 mL
  Filled 2012-11-05: qty 10

## 2012-11-05 MED ORDER — DEXAMETHASONE SODIUM PHOSPHATE 10 MG/ML IJ SOLN
10.0000 mg | Freq: Once | INTRAMUSCULAR | Status: AC
  Administered 2012-11-05: 10 mg via INTRAVENOUS

## 2012-11-05 NOTE — Addendum Note (Signed)
Addended by: Augustin Schooling C on: 11/05/2012 10:00 AM   Modules accepted: Orders

## 2012-11-05 NOTE — Patient Instructions (Addendum)
[facility] Discharge Instructions for Patients Receiving Chemotherapy  Today you received the following chemotherapy agents Treanda   To help prevent nausea and vomiting after your treatment, we encourage you to take your nausea medication as directed.   If you develop nausea and vomiting that is not controlled by your nausea medication, call the clinic. If it is after clinic hours your family physician or the after hours number for the clinic or go to the Emergency Department.   BELOW ARE SYMPTOMS THAT SHOULD BE REPORTED IMMEDIATELY:  *FEVER GREATER THAN 100.5 F  *CHILLS WITH OR WITHOUT FEVER  NAUSEA AND VOMITING THAT IS NOT CONTROLLED WITH YOUR NAUSEA MEDICATION  *UNUSUAL SHORTNESS OF BREATH  *UNUSUAL BRUISING OR BLEEDING  TENDERNESS IN MOUTH AND THROAT WITH OR WITHOUT PRESENCE OF ULCERS  *URINARY PROBLEMS  *BOWEL PROBLEMS  UNUSUAL RASH Items with * indicate a potential emergency and should be followed up as soon as possible.  One of the nurses will contact you 24 hours after your treatment. Please let the nurse know about any problems that you may have experienced. Feel free to call the clinic you have any questions or concerns. The clinic phone number is [phone].   I have been informed and understand all the instructions given to me. I know to contact the clinic, my physician, or go to the Emergency Department if any problems should occur. I do not have any questions at this time, but understand that I may call the clinic during office hours   should I have any questions or need assistance in obtaining follow up care.    __________________________________________  _____________  __________ Signature of Patient or Authorized Representative            Date                   Time    __________________________________________ Nurse's Signature    

## 2012-11-06 ENCOUNTER — Ambulatory Visit (HOSPITAL_BASED_OUTPATIENT_CLINIC_OR_DEPARTMENT_OTHER): Payer: Medicare Other

## 2012-11-06 VITALS — BP 136/53 | HR 59 | Temp 98.0°F | Resp 18

## 2012-11-06 DIAGNOSIS — C8590 Non-Hodgkin lymphoma, unspecified, unspecified site: Secondary | ICD-10-CM

## 2012-11-06 DIAGNOSIS — C8589 Other specified types of non-Hodgkin lymphoma, extranodal and solid organ sites: Secondary | ICD-10-CM

## 2012-11-06 MED ORDER — PEGFILGRASTIM INJECTION 6 MG/0.6ML
6.0000 mg | Freq: Once | SUBCUTANEOUS | Status: AC
  Administered 2012-11-06: 6 mg via SUBCUTANEOUS

## 2012-11-11 ENCOUNTER — Encounter: Payer: Self-pay | Admitting: Adult Health

## 2012-11-11 ENCOUNTER — Ambulatory Visit: Payer: Medicare Other

## 2012-11-11 ENCOUNTER — Ambulatory Visit (HOSPITAL_BASED_OUTPATIENT_CLINIC_OR_DEPARTMENT_OTHER): Payer: Medicare Other | Admitting: Adult Health

## 2012-11-11 ENCOUNTER — Other Ambulatory Visit (HOSPITAL_BASED_OUTPATIENT_CLINIC_OR_DEPARTMENT_OTHER): Payer: Medicare Other | Admitting: Lab

## 2012-11-11 VITALS — BP 144/74 | HR 81 | Temp 97.9°F | Resp 20 | Ht 62.0 in | Wt 133.1 lb

## 2012-11-11 DIAGNOSIS — C8590 Non-Hodgkin lymphoma, unspecified, unspecified site: Secondary | ICD-10-CM

## 2012-11-11 DIAGNOSIS — E876 Hypokalemia: Secondary | ICD-10-CM

## 2012-11-11 DIAGNOSIS — C7951 Secondary malignant neoplasm of bone: Secondary | ICD-10-CM

## 2012-11-11 DIAGNOSIS — C50919 Malignant neoplasm of unspecified site of unspecified female breast: Secondary | ICD-10-CM

## 2012-11-11 DIAGNOSIS — C8589 Other specified types of non-Hodgkin lymphoma, extranodal and solid organ sites: Secondary | ICD-10-CM

## 2012-11-11 LAB — CBC WITH DIFFERENTIAL/PLATELET
BASO%: 0.4 % (ref 0.0–2.0)
HCT: 34.6 % — ABNORMAL LOW (ref 34.8–46.6)
MCHC: 33 g/dL (ref 31.5–36.0)
MONO#: 3.4 10*3/uL — ABNORMAL HIGH (ref 0.1–0.9)
NEUT%: 84.5 % — ABNORMAL HIGH (ref 38.4–76.8)
RBC: 3.93 10*6/uL (ref 3.70–5.45)
RDW: 19.7 % — ABNORMAL HIGH (ref 11.2–14.5)
WBC: 28.4 10*3/uL — ABNORMAL HIGH (ref 3.9–10.3)
lymph#: 0.2 10*3/uL — ABNORMAL LOW (ref 0.9–3.3)

## 2012-11-11 LAB — COMPREHENSIVE METABOLIC PANEL (CC13)
ALT: 10 U/L (ref 0–55)
BUN: 4 mg/dL — ABNORMAL LOW (ref 7.0–26.0)
CO2: 26 mEq/L (ref 22–29)
Calcium: 8.8 mg/dL (ref 8.4–10.4)
Chloride: 105 mEq/L (ref 98–107)
Creatinine: 0.7 mg/dL (ref 0.6–1.1)
Glucose: 108 mg/dl — ABNORMAL HIGH (ref 70–99)
Total Bilirubin: 0.31 mg/dL (ref 0.20–1.20)

## 2012-11-11 MED ORDER — POTASSIUM CHLORIDE 10 MEQ/100ML IV SOLN: 10.0000 meq | INTRAVENOUS | Status: DC

## 2012-11-11 MED ORDER — POTASSIUM CHLORIDE 20 MEQ/15ML (10%) PO SOLN: 40.0000 meq | Freq: Every day | ORAL | Status: DC

## 2012-11-11 MED ORDER — POTASSIUM CHLORIDE CRYS ER 20 MEQ PO TBCR
40.0000 meq | EXTENDED_RELEASE_TABLET | Freq: Once | ORAL | Status: DC
  Administered 2012-11-11: 40 meq via ORAL
  Filled 2012-11-11: qty 2

## 2012-11-11 MED ORDER — POTASSIUM CHLORIDE CRYS ER 20 MEQ PO TBCR: 20.0000 meq | EXTENDED_RELEASE_TABLET | Freq: Every day | ORAL | Status: DC

## 2012-11-11 MED ORDER — SODIUM CHLORIDE 0.9 % IV SOLN
INTRAVENOUS | Status: DC
  Administered 2012-11-11: 11:00:00 via INTRAVENOUS
  Filled 2012-11-11: qty 500

## 2012-11-11 MED ORDER — DENOSUMAB 120 MG/1.7ML ~~LOC~~ SOLN
120.0000 mg | Freq: Once | SUBCUTANEOUS | Status: AC
  Administered 2012-11-11: 120 mg via SUBCUTANEOUS
  Filled 2012-11-11: qty 1.7

## 2012-11-11 NOTE — Patient Instructions (Addendum)

## 2012-11-11 NOTE — Progress Notes (Signed)
OFFICE PROGRESS NOTE  CC  [name], MD [address] [facility] [address] [place], [address] [place] [address]  DIAGNOSIS: 78 year old female with:  #1 left breast carcinoma originally presenting as a fungating mass. Patient underwent neoadjuvant chemotherapy followed by mastectomy. She is currently on Arimidex 1mg  daily.   #2 low-grade non-Hodgkin lymphoma status post CVP and Rituxan now receiving R-Bendamustine.   PRIOR THERAPY:  #1 the patient was diagnosed with metastatic breast carcinoma beginning October 2009. She returned her 1 chemotherapy followed by mastectomy radiation. She was on letrozole 2.5 mg daily, but has changed to Arimidex 1mg  daily.  Also receives Xgeva every 28 days.    #2 Was diagnosed with low-grade non-Hodgkin lymphoma  12. She received 6 cycles of CVP and Rituxan, followed by maintenance Rituxan, who recurred and is now on R-Bendamustine.    CURRENT THERAPY:  Cycle 4 day 8 of R-Bendamustine with Neulasta support.    INTERVAL HISTORY: Candise Crabtree 78 y.o. female returns for follow up today.She is doing well.  She had her opthalmology appointment this past Monday and will f/u on 1/27 for cataracts.  She's had some recent loose bms, about 2 per day since yesterday.  She has not been taking her potassium.  She has had no fevers, chills, nausea, vomiting, or any other concerns.  She is scheduled for her PET/CT tomorrow.    MEDICAL HISTORY: Past Medical History  Diagnosis Date  . Arthritis   . Blood transfusion 2009  . Breast CA 08/2008    (LT) breast ca dx 10/09/Chemo  . Lymphoma 09/09/2011    NHL  . Leukemia, acute, in remission 2012    Pt. not sure of type  . Hypertension   . Seizures 1980's    from medication reaction/Pt.  [name] Hx of radiation therapy 09/11/08 -10/31/08    left breast  . Metastasis to bone 07/03/2012    MRI L spine  . History of radiation therapy eot 07/30/12    lumbar spine L4 /l shoulder    ALLERGIES:  is allergic to codeine;  penicillins; sulfonamide derivatives; tape; and tramadol hcl.  MEDICATIONS:  Current Outpatient Prescriptions  Medication Sig Dispense Refill  . anastrozole (ARIMIDEX) 1 MG tablet Take 1 tablet (1 mg total) by mouth daily.  30 tablet  6  . calcium-vitamin D (OSCAL WITH D) 500-200 MG-UNIT per tablet Take 2 tablets by mouth daily.      . carboxymethylcellulose (REFRESH PLUS) 0.5 % SOLN 1 drop 3 (three) times daily as needed.      [name] omeprazole (PRILOSEC) 40 MG capsule Take 1 capsule (40 mg total) by mouth daily.  30 capsule  2  . oxycodone (OXY-IR) 5 MG capsule Take 1 capsule (5 mg total) by mouth every 4 (four) hours as needed.  90 capsule  0  . potassium chloride SA (K-DUR,KLOR-CON) 20 MEQ tablet Take 1 tablet (20 mEq total) by mouth daily.  30 tablet  0   Current Facility-Administered Medications  Medication Dose Route Frequency Provider Last Rate Last Dose  . potassium chloride 20 MEQ/15ML (10%) solution 40 mEq  40 mEq Oral Daily Augustin Schooling, NP      . potassium chloride SA (K-DUR,KLOR-CON) CR tablet 40 mEq  40 mEq Oral Once Augustin Schooling, NP        SURGICAL HISTORY:  Past Surgical History  Procedure Date  . Mastectomy   . Thyroidectomy 1965  . Appendectomy   . Tonsillectomy     REVIEW OF  SYSTEMS:  General: fatigue (-), night sweats (-), fever (-), pain (-) Lymph: palpable nodes (-) HEENT: vision changes (-), mucositis (-), gum bleeding (-), epistaxis (-) Cardiovascular: chest pain (-), palpitations (-) Pulmonary: shortness of breath (-), dyspnea on exertion (-), cough (-), hemoptysis (-) GI:  Early satiety (-), melena (-), dysphagia (-), nausea/vomiting (-), diarrhea (-) GU: dysuria (-), hematuria (-), incontinence (-) Musculoskeletal: joint swelling (-), joint pain (-), back pain (-) Neuro: weakness (-), numbness (-), headache (-), confusion (-) Skin: Rash (-), lesions (-), dryness (-) Psych: depression (-), suicidal/homicidal ideation (-), feeling of hopelessness  (-)   PHYSICAL EXAMINATION:  BP 144/74  Pulse 81  Temp 97.9 F (36.6 C)  Resp 20  Ht 5\' 2"  (1.575 m)  Wt 133 lb 1.6 oz (60.374 kg)  BMI 24.34 kg/m2 General: Patient is a well appearing female in no acute distress HEENT: PERRLA, sclerae anicteric no conjunctival pallor, MMM, Tympanic membranes opaque, +BLM, no erythema or exudate, turbinates non-erythematous, no exudate.   Neck: supple, no palpable adenopathy Lungs: clear to auscultation bilaterally, no wheezes, rhonchi, or rales Cardiovascular: regular rate rhythm, S1, S2, no murmurs, rubs or gallops Abdomen: Soft, mild epigastric tenderness, non-distended, normoactive bowel sounds, no HSM Extremities: warm and well perfused, no clubbing, cyanosis, or edema Skin: No rashes or lesions Neuro: Non-focal ECOG PERFORMANCE STATUS: 1 - Symptomatic but completely ambulatory  LABORATORY DATA: Lab Results  Component Value Date   WBC 28.4* 11/11/2012   HGB 11.4* 11/11/2012   HCT 34.6* 11/11/2012   MCV 88.1 11/11/2012   PLT 159 11/11/2012      Chemistry      Component Value Date/Time   NA 139 11/11/2012 0819   NA 136 07/06/2012 1122   NA 142 06/01/2012 1008   K 2.7 Repeated and Verified* 11/11/2012 0819   K 3.8 07/06/2012 1122   K 3.8 06/01/2012 1008   CL 105 11/11/2012 0819   CL 102 07/06/2012 1122   CL 103 06/01/2012 1008   CO2 26 11/11/2012 0819   CO2 20 07/06/2012 1122   CO2 25 06/01/2012 1008   BUN 4.0* 11/11/2012 0819   BUN 8 07/06/2012 1122   BUN 9 06/01/2012 1008   CREATININE 0.7 11/11/2012 0819   CREATININE 0.60 07/06/2012 1122   CREATININE 0.9 06/01/2012 1008      Component Value Date/Time   CALCIUM 8.8 11/11/2012 0819   CALCIUM 9.1 07/06/2012 1122   CALCIUM 9.8 06/01/2012 1008   ALKPHOS 138 11/11/2012 0819   ALKPHOS 69 02/10/2012 1035   ALKPHOS 78 08/14/2010 1251   AST 25 11/11/2012 0819   AST 20 02/10/2012 1035   AST 19 08/14/2010 1251   ALT 10 11/11/2012 0819   ALT 19 02/10/2012 1035   BILITOT 0.31 11/11/2012 0819   BILITOT 0.4 02/10/2012 1035   BILITOT  0.40 08/14/2010 1251       RADIOGRAPHIC STUDIES: NUCLEAR MEDICINE PET CT SKULL BASE TO THIGH  Technique: Technique: 18.8 mCi F-18 FDG was injected  intravenously. CT data was obtained and used for attenuation  correction and anatomic localization only. (This was not acquired  as a diagnostic CT examination.) Additional exam technical data  entered on technologist worksheet.  Comparison: PET of 02/07/2011. CTs of 06/01/2012.  Findings: Neck: Hypermetabolism within the left side of the  mandible, with suspicion of concurrent periapical focal lucency on  image 24.  Bilateral cervical hypermetabolic lymph nodes. Hypermetabolism  which is primarily felt to be nodal surrounds the right jugular  vein  and extends into the thoracic inlet. This measures a S.U.V.  max of 9.9 on image 57  Chest: Hypermetabolic right axillary node. This measures 1.7 cm  and a S.U.V. max of 6.1 on image 82. On the prior PET, this node  measured 1.7 cm and a S.U.V. max of 3.1.  Small prevascular nodes which are hypermetabolic, including on  image 72.  Abdomen/Pelvis: Right adrenal hypermetabolism which is without CT  correlate. Hypermetabolic left external iliac adenopathy. This  measures 1.3 cm and a S.U.V. max of 5.0 on image 181. On the prior  PET, this node measured 1.6 cm and on the order of a S.U.V. max of  2.6.  Skelton: Multifocal marrow hypermetabolism consistent with osseous  involvement. Index lesion in the right humeral head measures a  S.U.V. max of 14.4 on image 53 and is new.  A hypermetabolic lytic lesion within the left iliac wing is new or  progressive and measures a S.U.V. max of 8.1 on image 155.  CT images performed for attenuation correction demonstrate  increased number and size of lymph nodes within the neck. Index  right jugulodigastric node measures 9 mm short axis on image 34  versus 6 mm on the prior PET. Chest, abdomen, and pelvic findings  deferred to recent diagnostic CTs. Trace  right-sided pleural fluid  is new. Left upper lobe radiation fibrosis. Punctate left renal  calculi. Cholelithiasis. Fibroid uterus. L4 osseous metastasis  has possible epidural component on image 144 transverse.  IMPRESSION:  1. Increasing hypermetabolism associated with adenopathy within  the neck, chest, and pelvis, as detailed above. Findings are most  likely indicative of progression of lymphoma.  2. Progressive osseous metastasis, as detailed above. An L4 lesion  has possible epidural component. Consider pre and post contrast  lumbar spine MRI.  3. Hypermetabolism at the right lower neck and thoracic inlet  surrounds the right internal jugular vein. This appears somewhat  hyperattenuating. Cannot exclude thrombus. Consider right upper  extremity venous ultrasound with attention to this area.  4. Likely dental inflammatory left mandibular hypermetabolism.  Consider physical exam correlation.   ASSESSMENT: 78 year old female with  #1Patient with history of low-grade non-Hodgkin lymphoma she initially received CVP Rituxan and then subsequently on maintenance Rituxan, now on R Bendamustine.    #2 metastatic breast carcinoma originally diagnosed in 2009 originally on letrozole 2.5 mg daily, now on Arimidex.  #3 L4 bone metastasis which is painful. Patient is now status post palliative radiation therapy.  Also receiving Xgeva q28 days.    #4 Hypokalemia  #5 Vision changes  PLAN:  #1 Ms. Brickell is doing well today.  Her blood counts are stable.    #2 She is taking her Arimidex and tolerating it pretty well.  She will receive her Rivka Barbara today.    #3 Ms. Salvas's potassium is very low.  I will give her by mouth and IV today.  I have instructed her to get her KDur refilled and take it daily starting tomorrow.     #4 she will return on 1/16 for labs, and an appt to discuss her scans.     All questions were answered. The patient knows to call the clinic with any  problems, questions or concerns. We can certainly see the patient much sooner if necessary.  I spent 25 minutes counseling the patient face to face. The total time spent in the appointment was 30 minutes.  This case was reviewed with Dr. Welton Flakes.    Cherie Ouch  Lyn Hollingshead, NP Medical Oncology [facility] Phone: [phone] 11/11/2012, 9:33 AM

## 2012-11-11 NOTE — Addendum Note (Signed)
Addended by: Augustin Schooling C on: 11/11/2012 03:45 PM   Modules accepted: Level of Service

## 2012-11-11 NOTE — Patient Instructions (Addendum)
Doing well.  You will receive you Xgeva today.  We will see you back on 11/18/12 and review your PET/CT scans.  Your potassium is very low.  We are going to give you some IV and by mouth today.  Please get it filled ASAP and start taking (1 tablet) by mouth every day.    Please call us if you have any questions or concerns.

## 2012-11-12 ENCOUNTER — Ambulatory Visit (HOSPITAL_COMMUNITY)
Admission: RE | Admit: 2012-11-12 | Discharge: 2012-11-12 | Disposition: A | Discharge status: Home / Self Care | Payer: Medicare Other | Source: Ambulatory Visit | Attending: Adult Health | Admitting: Adult Health

## 2012-11-12 ENCOUNTER — Ambulatory Visit: Payer: Medicare Other

## 2012-11-12 DIAGNOSIS — C8590 Non-Hodgkin lymphoma, unspecified, unspecified site: Secondary | ICD-10-CM

## 2012-11-12 DIAGNOSIS — I872 Venous insufficiency (chronic) (peripheral): Secondary | ICD-10-CM | POA: Insufficient documentation

## 2012-11-12 DIAGNOSIS — C8589 Other specified types of non-Hodgkin lymphoma, extranodal and solid organ sites: Secondary | ICD-10-CM | POA: Insufficient documentation

## 2012-11-12 DIAGNOSIS — K4020 Bilateral inguinal hernia, without obstruction or gangrene, not specified as recurrent: Secondary | ICD-10-CM | POA: Insufficient documentation

## 2012-11-12 DIAGNOSIS — D259 Leiomyoma of uterus, unspecified: Secondary | ICD-10-CM | POA: Insufficient documentation

## 2012-11-12 DIAGNOSIS — N289 Disorder of kidney and ureter, unspecified: Secondary | ICD-10-CM | POA: Insufficient documentation

## 2012-11-12 DIAGNOSIS — K439 Ventral hernia without obstruction or gangrene: Secondary | ICD-10-CM | POA: Insufficient documentation

## 2012-11-12 DIAGNOSIS — C50919 Malignant neoplasm of unspecified site of unspecified female breast: Secondary | ICD-10-CM | POA: Insufficient documentation

## 2012-11-12 DIAGNOSIS — K7689 Other specified diseases of liver: Secondary | ICD-10-CM | POA: Insufficient documentation

## 2012-11-12 DIAGNOSIS — K449 Diaphragmatic hernia without obstruction or gangrene: Secondary | ICD-10-CM | POA: Insufficient documentation

## 2012-11-12 DIAGNOSIS — K8020 Calculus of gallbladder without cholecystitis without obstruction: Secondary | ICD-10-CM | POA: Insufficient documentation

## 2012-11-12 DIAGNOSIS — I517 Cardiomegaly: Secondary | ICD-10-CM | POA: Insufficient documentation

## 2012-11-12 LAB — GLUCOSE, CAPILLARY: Glucose-Capillary: 81 mg/dL (ref 70–99)

## 2012-11-12 MED ORDER — IOHEXOL 300 MG/ML  SOLN
100.0000 mL | Freq: Once | INTRAMUSCULAR | Status: AC | PRN
  Administered 2012-11-12: 100 mL via INTRAVENOUS

## 2012-11-12 MED ORDER — FLUDEOXYGLUCOSE F - 18 (FDG) INJECTION
17.7000 | Freq: Once | INTRAVENOUS | Status: AC | PRN
  Administered 2012-11-12: 17.7 via INTRAVENOUS

## 2012-11-18 ENCOUNTER — Telehealth: Payer: Self-pay | Admitting: *Deleted

## 2012-11-18 ENCOUNTER — Encounter: Payer: Self-pay | Admitting: Adult Health

## 2012-11-18 ENCOUNTER — Ambulatory Visit (HOSPITAL_BASED_OUTPATIENT_CLINIC_OR_DEPARTMENT_OTHER): Payer: Medicare Other | Admitting: Adult Health

## 2012-11-18 ENCOUNTER — Other Ambulatory Visit (HOSPITAL_BASED_OUTPATIENT_CLINIC_OR_DEPARTMENT_OTHER): Payer: Medicare Other | Admitting: Lab

## 2012-11-18 VITALS — BP 127/75 | HR 73 | Temp 98.3°F | Resp 20 | Ht 62.0 in | Wt 133.8 lb

## 2012-11-18 DIAGNOSIS — C8589 Other specified types of non-Hodgkin lymphoma, extranodal and solid organ sites: Secondary | ICD-10-CM

## 2012-11-18 DIAGNOSIS — C8590 Non-Hodgkin lymphoma, unspecified, unspecified site: Secondary | ICD-10-CM

## 2012-11-18 DIAGNOSIS — E876 Hypokalemia: Secondary | ICD-10-CM

## 2012-11-18 DIAGNOSIS — C50919 Malignant neoplasm of unspecified site of unspecified female breast: Secondary | ICD-10-CM

## 2012-11-18 DIAGNOSIS — C7951 Secondary malignant neoplasm of bone: Secondary | ICD-10-CM

## 2012-11-18 LAB — CBC WITH DIFFERENTIAL/PLATELET
Basophils Absolute: 0.1 10*3/uL (ref 0.0–0.1)
Eosinophils Absolute: 0.4 10*3/uL (ref 0.0–0.5)
HCT: 37.7 % (ref 34.8–46.6)
HGB: 12.1 g/dL (ref 11.6–15.9)
LYMPH%: 3.2 % — ABNORMAL LOW (ref 14.0–49.7)
MONO#: 0.8 10*3/uL (ref 0.1–0.9)
NEUT#: 4.9 10*3/uL (ref 1.5–6.5)
NEUT%: 76.8 % (ref 38.4–76.8)
Platelets: 160 10*3/uL (ref 145–400)
WBC: 6.4 10*3/uL (ref 3.9–10.3)
lymph#: 0.2 10*3/uL — ABNORMAL LOW (ref 0.9–3.3)

## 2012-11-18 LAB — COMPREHENSIVE METABOLIC PANEL (CC13)
CO2: 25 mEq/L (ref 22–29)
Calcium: 9.6 mg/dL (ref 8.4–10.4)
Chloride: 107 mEq/L (ref 98–107)
Creatinine: 0.7 mg/dL (ref 0.6–1.1)
Glucose: 88 mg/dl (ref 70–99)
Total Bilirubin: 0.31 mg/dL (ref 0.20–1.20)

## 2012-11-18 NOTE — Progress Notes (Signed)
OFFICE PROGRESS NOTE  CC  [name], MD [address] [facility] [address] [place], [address] [place] [address]  DIAGNOSIS: 78 year old female with:  #1 left breast carcinoma originally presenting as a fungating mass. Patient underwent neoadjuvant chemotherapy followed by mastectomy. She is currently on Arimidex 1mg  daily.   #2 low-grade non-Hodgkin lymphoma status post CVP and Rituxan now receiving R-Bendamustine.   PRIOR THERAPY:  #1 the patient was diagnosed with metastatic breast carcinoma beginning October 2009. She returned her 1 chemotherapy followed by mastectomy radiation. She was on letrozole 2.5 mg daily, but has changed to Arimidex 1mg  daily.  Also receives Xgeva every 28 days.    #2 Was diagnosed with low-grade non-Hodgkin lymphoma  12. She received 6 cycles of CVP and Rituxan, followed by maintenance Rituxan, who recurred and is now on R-Bendamustine.    #3 Pt underwent 4 cycles of R-Bendamustin PET/CT on 1/10 showed CR to therapy, will complete 2 cycles of treatment and the proceed to maintenance Rituxan.  CURRENT THERAPY:  Xgeva and R-Bendamustine.    INTERVAL HISTORY: Helen [name] 78 y.o. female returns for follow up today.She is doing well.  Her scans reveal a response to therapy.  She's been taking her potassium as prescribed.  She's denies fevers, chills, chest pain, headaches, or any other concerns.  A 10 point ros is neg.    MEDICAL HISTORY: Past Medical History  Diagnosis Date  . Arthritis   . Blood transfusion 2009  . Breast CA 08/2008    (LT) breast ca dx 10/09/Chemo  . Lymphoma 09/09/2011    NHL  . Leukemia, acute, in remission 2012    Pt. not sure of type  . Hypertension   . Seizures 1980's    from medication reaction/Pt.  [name] Hx of radiation therapy 09/11/08 -10/31/08    left breast  . Metastasis to bone 07/03/2012    MRI L spine  . History of radiation therapy eot 07/30/12    lumbar spine L4 /l shoulder    ALLERGIES:  is allergic to codeine;  penicillins; sulfonamide derivatives; tape; and tramadol hcl.  MEDICATIONS:  Current Outpatient Prescriptions  Medication Sig Dispense Refill  . anastrozole (ARIMIDEX) 1 MG tablet Take 1 tablet (1 mg total) by mouth daily.  30 tablet  6  . calcium-vitamin D (OSCAL WITH D) 500-200 MG-UNIT per tablet Take 2 tablets by mouth daily.      . carboxymethylcellulose (REFRESH PLUS) 0.5 % SOLN 1 drop 3 (three) times daily as needed.      [name] oxycodone (OXY-IR) 5 MG capsule Take 1 capsule (5 mg total) by mouth every 4 (four) hours as needed.  90 capsule  0  . potassium chloride SA (K-DUR,KLOR-CON) 20 MEQ tablet Take 1 tablet (20 mEq total) by mouth daily.  30 tablet  0  . omeprazole (PRILOSEC) 40 MG capsule Take 1 capsule (40 mg total) by mouth daily.  30 capsule  2    SURGICAL HISTORY:  Past Surgical History  Procedure Date  . Mastectomy   . Thyroidectomy 1965  . Appendectomy   . Tonsillectomy     REVIEW OF SYSTEMS:  General: fatigue (-), night sweats (-), fever (-), pain (-) Lymph: palpable nodes (-) HEENT: vision changes (-), mucositis (-), gum bleeding (-), epistaxis (-) Cardiovascular: chest pain (-), palpitations (-) Pulmonary: shortness of breath (-), dyspnea on exertion (-), cough (-), hemoptysis (-) GI:  Early satiety (-), melena (-), dysphagia (-), nausea/vomiting (-), diarrhea (-) GU: dysuria (-),  hematuria (-), incontinence (-) Musculoskeletal: joint swelling (-), joint pain (-), back pain (-) Neuro: weakness (-), numbness (-), headache (-), confusion (-) Skin: Rash (-), lesions (-), dryness (-) Psych: depression (-), suicidal/homicidal ideation (-), feeling of hopelessness (-)   PHYSICAL EXAMINATION:  BP 127/75  Pulse 73  Temp 98.3 F (36.8 C)  Resp 20  Ht 5\' 2"  (1.575 m)  Wt 133 lb 12.8 oz (60.691 kg)  BMI 24.47 kg/m2 General: Patient is a well appearing female in no acute distress HEENT: PERRLA, sclerae anicteric no conjunctival pallor, MMM,  Neck: supple, no palpable  adenopathy Lungs: clear to auscultation bilaterally, no wheezes, rhonchi, or rales Cardiovascular: regular rate rhythm, S1, S2, no murmurs, rubs or gallops Abdomen: Soft, mild epigastric tenderness, non-distended, normoactive bowel sounds, no HSM Extremities: warm and well perfused, no clubbing, cyanosis, or edema Skin: No rashes or lesions Neuro: Non-focal ECOG PERFORMANCE STATUS: 1 - Symptomatic but completely ambulatory  LABORATORY DATA: Lab Results  Component Value Date   WBC 6.4 11/18/2012   HGB 12.1 11/18/2012   HCT 37.7 11/18/2012   MCV 87.9 11/18/2012   PLT 160 11/18/2012      Chemistry      Component Value Date/Time   NA 141 11/18/2012 0810   NA 136 07/06/2012 1122   NA 142 06/01/2012 1008   K 3.9 11/18/2012 0810   K 3.8 07/06/2012 1122   K 3.8 06/01/2012 1008   CL 107 11/18/2012 0810   CL 102 07/06/2012 1122   CL 103 06/01/2012 1008   CO2 25 11/18/2012 0810   CO2 20 07/06/2012 1122   CO2 25 06/01/2012 1008   BUN 7.0 11/18/2012 0810   BUN 8 07/06/2012 1122   BUN 9 06/01/2012 1008   CREATININE 0.7 11/18/2012 0810   CREATININE 0.60 07/06/2012 1122   CREATININE 0.9 06/01/2012 1008      Component Value Date/Time   CALCIUM 9.6 11/18/2012 0810   CALCIUM 9.1 07/06/2012 1122   CALCIUM 9.8 06/01/2012 1008   ALKPHOS 110 11/18/2012 0810   ALKPHOS 69 02/10/2012 1035   ALKPHOS 78 08/14/2010 1251   AST 18 11/18/2012 0810   AST 20 02/10/2012 1035   AST 19 08/14/2010 1251   ALT <6 Repeated and Verified 11/18/2012 0810   ALT 19 02/10/2012 1035   BILITOT 0.31 11/18/2012 0810   BILITOT 0.4 02/10/2012 1035   BILITOT 0.40 08/14/2010 1251       RADIOGRAPHIC STUDIES: NUCLEAR MEDICINE PET CT SKULL BASE TO THIGH  Technique: Technique: 18.8 mCi F-18 FDG was injected  intravenously. CT data was obtained and used for attenuation  correction and anatomic localization only. (This was not acquired  as a diagnostic CT examination.) Additional exam technical data  entered on technologist worksheet.  Comparison: PET of  02/07/2011. CTs of 06/01/2012.  Findings: Neck: Hypermetabolism within the left side of the  mandible, with suspicion of concurrent periapical focal lucency on  image 24.  Bilateral cervical hypermetabolic lymph nodes. Hypermetabolism  which is primarily felt to be nodal surrounds the right jugular  vein and extends into the thoracic inlet. This measures a S.U.V.  max of 9.9 on image 57  Chest: Hypermetabolic right axillary node. This measures 1.7 cm  and a S.U.V. max of 6.1 on image 82. On the prior PET, this node  measured 1.7 cm and a S.U.V. max of 3.1.  Small prevascular nodes which are hypermetabolic, including on  image 72.  Abdomen/Pelvis: Right adrenal hypermetabolism which is without CT  correlate. Hypermetabolic left external iliac adenopathy. This  measures 1.3 cm and a S.U.V. max of 5.0 on image 181. On the prior  PET, this node measured 1.6 cm and on the order of a S.U.V. max of  2.6.  Skelton: Multifocal marrow hypermetabolism consistent with osseous  involvement. Index lesion in the right humeral head measures a  S.U.V. max of 14.4 on image 53 and is new.  A hypermetabolic lytic lesion within the left iliac wing is new or  progressive and measures a S.U.V. max of 8.1 on image 155.  CT images performed for attenuation correction demonstrate  increased number and size of lymph nodes within the neck. Index  right jugulodigastric node measures 9 mm short axis on image 34  versus 6 mm on the prior PET. Chest, abdomen, and pelvic findings  deferred to recent diagnostic CTs. Trace right-sided pleural fluid  is new. Left upper lobe radiation fibrosis. Punctate left renal  calculi. Cholelithiasis. Fibroid uterus. L4 osseous metastasis  has possible epidural component on image 144 transverse.  IMPRESSION:  1. Increasing hypermetabolism associated with adenopathy within  the neck, chest, and pelvis, as detailed above. Findings are most  likely indicative of progression of  lymphoma.  2. Progressive osseous metastasis, as detailed above. An L4 lesion  has possible epidural component. Consider pre and post contrast  lumbar spine MRI.  3. Hypermetabolism at the right lower neck and thoracic inlet  surrounds the right internal jugular vein. This appears somewhat  hyperattenuating. Cannot exclude thrombus. Consider right upper  extremity venous ultrasound with attention to this area.  4. Likely dental inflammatory left mandibular hypermetabolism.  Consider physical exam correlation.   ASSESSMENT: 78 year old female with  #1Patient with history of low-grade non-Hodgkin lymphoma she initially received CVP Rituxan and then subsequently on maintenance Rituxan, now on R Bendamustine.    #2 metastatic breast carcinoma originally diagnosed in 2009 originally on letrozole 2.5 mg daily, now on Arimidex.  #3 L4 bone metastasis which is painful. Patient is now status post palliative radiation therapy.  Also receiving Xgeva q28 days.    #4 Hypokalemia   PLAN:  #1 Helen [name] is doing well today.  She had a PET/CT on 1/10 which demonstrated response to her therapy.  We will give her two more cycles of R-Bendamustine, repeat CT scan, and then start maintenance Rituxan.    #2 She is taking her Arimidex and tolerating it pretty well.  She will receive her Xgeva next month.   #3 Helen [name] potassium has improved since last week.  She's taking daily.    #4 she will return on 1/30 for labs, appt, and treatment.   All questions were answered. The patient knows to call the clinic with any problems, questions or concerns. We can certainly see the patient much sooner if necessary.  I spent 25 minutes counseling the patient face to face. The total time spent in the appointment was 30 minutes.  This case was reviewed with Dr. Welton Flakes.    Cherie Ouch Lyn Hollingshead, NP Medical Oncology [facility]  Phone: [phone] 11/18/2012, 10:21 AM

## 2012-11-18 NOTE — Patient Instructions (Addendum)
Doing well. We will see you back next week for your treatment.

## 2012-11-18 NOTE — Telephone Encounter (Signed)
change xgeva appt from 2/7 to 2/6 after NP appt and 3/7 to 3/6 after NP appt  Sent michelle email to set up treatment

## 2012-11-26 ENCOUNTER — Telehealth: Payer: Self-pay | Admitting: *Deleted

## 2012-11-26 ENCOUNTER — Telehealth: Payer: Self-pay | Admitting: Oncology

## 2012-11-26 NOTE — Telephone Encounter (Signed)
New appt time given for 1/30, and treatment in.  JMW

## 2012-11-26 NOTE — Telephone Encounter (Signed)
S/w pt re appt for 1/30 @ 9am and pt will get schedule when she comes in. Per LA ok w/KK to move pt to her schedule at 9:30am on 1/30.

## 2012-12-02 ENCOUNTER — Other Ambulatory Visit (HOSPITAL_BASED_OUTPATIENT_CLINIC_OR_DEPARTMENT_OTHER): Payer: Medicare Other | Admitting: Lab

## 2012-12-02 ENCOUNTER — Ambulatory Visit (HOSPITAL_BASED_OUTPATIENT_CLINIC_OR_DEPARTMENT_OTHER): Payer: Medicare Other | Admitting: Adult Health

## 2012-12-02 ENCOUNTER — Encounter: Payer: Self-pay | Admitting: Adult Health

## 2012-12-02 ENCOUNTER — Ambulatory Visit (HOSPITAL_BASED_OUTPATIENT_CLINIC_OR_DEPARTMENT_OTHER): Payer: Medicare Other

## 2012-12-02 ENCOUNTER — Telehealth: Payer: Self-pay | Admitting: Oncology

## 2012-12-02 VITALS — BP 107/66 | HR 65 | Temp 98.3°F | Resp 20 | Ht 62.0 in | Wt 131.2 lb

## 2012-12-02 VITALS — BP 123/47 | HR 56 | Temp 98.3°F | Resp 20

## 2012-12-02 DIAGNOSIS — C8589 Other specified types of non-Hodgkin lymphoma, extranodal and solid organ sites: Secondary | ICD-10-CM

## 2012-12-02 DIAGNOSIS — C7951 Secondary malignant neoplasm of bone: Secondary | ICD-10-CM

## 2012-12-02 DIAGNOSIS — Z5111 Encounter for antineoplastic chemotherapy: Secondary | ICD-10-CM

## 2012-12-02 DIAGNOSIS — C8590 Non-Hodgkin lymphoma, unspecified, unspecified site: Secondary | ICD-10-CM

## 2012-12-02 DIAGNOSIS — C50919 Malignant neoplasm of unspecified site of unspecified female breast: Secondary | ICD-10-CM

## 2012-12-02 DIAGNOSIS — Z5112 Encounter for antineoplastic immunotherapy: Secondary | ICD-10-CM

## 2012-12-02 LAB — COMPREHENSIVE METABOLIC PANEL (CC13)
ALT: 12 U/L (ref 0–55)
AST: 16 U/L (ref 5–34)
Albumin: 3.5 g/dL (ref 3.5–5.0)
CO2: 23 mEq/L (ref 22–29)
Calcium: 10 mg/dL (ref 8.4–10.4)
Chloride: 108 mEq/L — ABNORMAL HIGH (ref 98–107)
Creatinine: 0.7 mg/dL (ref 0.6–1.1)
Potassium: 4.2 mEq/L (ref 3.5–5.1)
Total Protein: 6.8 g/dL (ref 6.4–8.3)

## 2012-12-02 LAB — CBC WITH DIFFERENTIAL/PLATELET
BASO%: 0.8 % (ref 0.0–2.0)
Eosinophils Absolute: 0.4 10*3/uL (ref 0.0–0.5)
HCT: 37 % (ref 34.8–46.6)
LYMPH%: 9.6 % — ABNORMAL LOW (ref 14.0–49.7)
MCHC: 31.9 g/dL (ref 31.5–36.0)
MONO#: 1.2 10*3/uL — ABNORMAL HIGH (ref 0.1–0.9)
NEUT#: 1.9 10*3/uL (ref 1.5–6.5)
NEUT%: 49.1 % (ref 38.4–76.8)
Platelets: 157 10*3/uL (ref 145–400)
RBC: 4.14 10*6/uL (ref 3.70–5.45)
WBC: 3.9 10*3/uL (ref 3.9–10.3)
lymph#: 0.4 10*3/uL — ABNORMAL LOW (ref 0.9–3.3)
nRBC: 0 % (ref 0–0)

## 2012-12-02 MED ORDER — HEPARIN SOD (PORK) LOCK FLUSH 100 UNIT/ML IV SOLN
500.0000 [IU] | Freq: Once | INTRAVENOUS | Status: DC | PRN
  Filled 2012-12-02: qty 5

## 2012-12-02 MED ORDER — ONDANSETRON 8 MG/50ML IVPB (CHCC)
8.0000 mg | Freq: Once | INTRAVENOUS | Status: AC
  Administered 2012-12-02: 8 mg via INTRAVENOUS

## 2012-12-02 MED ORDER — SODIUM CHLORIDE 0.9 % IV SOLN
Freq: Once | INTRAVENOUS | Status: AC
  Administered 2012-12-02: 14:00:00 via INTRAVENOUS

## 2012-12-02 MED ORDER — DEXAMETHASONE SODIUM PHOSPHATE 10 MG/ML IJ SOLN
10.0000 mg | Freq: Once | INTRAMUSCULAR | Status: AC
  Administered 2012-12-02: 10 mg via INTRAVENOUS

## 2012-12-02 MED ORDER — SODIUM CHLORIDE 0.9 % IV SOLN
70.0000 mg/m2 | Freq: Once | INTRAVENOUS | Status: AC
  Administered 2012-12-02: 115 mg via INTRAVENOUS
  Filled 2012-12-02: qty 23

## 2012-12-02 MED ORDER — SODIUM CHLORIDE 0.9 % IV SOLN
375.0000 mg/m2 | Freq: Once | INTRAVENOUS | Status: AC
  Administered 2012-12-02: 600 mg via INTRAVENOUS
  Filled 2012-12-02: qty 60

## 2012-12-02 MED ORDER — SODIUM CHLORIDE 0.9 % IJ SOLN
10.0000 mL | INTRAMUSCULAR | Status: DC | PRN
  Filled 2012-12-02: qty 10

## 2012-12-02 MED ORDER — ACETAMINOPHEN 325 MG PO TABS
650.0000 mg | ORAL_TABLET | Freq: Once | ORAL | Status: AC
  Administered 2012-12-02: 650 mg via ORAL

## 2012-12-02 MED ORDER — DIPHENHYDRAMINE HCL 25 MG PO CAPS
50.0000 mg | ORAL_CAPSULE | Freq: Once | ORAL | Status: AC
  Administered 2012-12-02: 50 mg via ORAL

## 2012-12-02 NOTE — Telephone Encounter (Signed)
appt added on 2/27,email to mw to move the chemo to follow the md visit

## 2012-12-02 NOTE — Patient Instructions (Addendum)
[facility] Discharge Instructions for Patients Receiving Chemotherapy  Today you received the following chemotherapy agents rituxan, treands  To help prevent nausea and vomiting after your treatment, we encourage you to take your nausea medication as needed If you develop nausea and vomiting that is not controlled by your nausea medication, call the clinic. If it is after clinic hours your family physician or the after hours number for the clinic or go to the Emergency Department.   BELOW ARE SYMPTOMS THAT SHOULD BE REPORTED IMMEDIATELY:  *FEVER GREATER THAN 100.5 F  *CHILLS WITH OR WITHOUT FEVER  NAUSEA AND VOMITING THAT IS NOT CONTROLLED WITH YOUR NAUSEA MEDICATION  *UNUSUAL SHORTNESS OF BREATH  *UNUSUAL BRUISING OR BLEEDING  TENDERNESS IN MOUTH AND THROAT WITH OR WITHOUT PRESENCE OF ULCERS  *URINARY PROBLEMS  *BOWEL PROBLEMS  UNUSUAL RASH Items with * indicate a potential emergency and should be followed up as soon as possible.   The clinic phone number is [phone].

## 2012-12-02 NOTE — Progress Notes (Signed)
OFFICE PROGRESS NOTE  CC  [name], MD [address] [facility] [address] [place], [address] [place] [address]  DIAGNOSIS: 78 year old female with:  #1 left breast carcinoma originally presenting as a fungating mass. Patient underwent neoadjuvant chemotherapy followed by mastectomy. She is currently on Arimidex 1mg  daily.   #2 low-grade non-Hodgkin lymphoma status post CVP and Rituxan now receiving R-Bendamustine.   PRIOR THERAPY:  #1 the patient was diagnosed with metastatic breast carcinoma beginning October 2009. She returned her 1 chemotherapy followed by mastectomy radiation. She was on letrozole 2.5 mg daily, but has changed to Arimidex 1mg  daily.  Also receives Xgeva every 28 days.    #2 Was diagnosed with low-grade non-Hodgkin lymphoma  12. She received 6 cycles of CVP and Rituxan, followed by maintenance Rituxan, who recurred and is now on R-Bendamustine.    #3 Pt underwent 4 cycles of R-Bendamustin PET/CT on 1/10 showed CR to therapy, will complete 2 cycles of treatment and the proceed to maintenance Rituxan.  CURRENT THERAPY:  Xgeva and R-Bendamustine cycle 5 day 1.    INTERVAL HISTORY: Daritza Brees 78 y.o. female returns for evaluation for her fifth cycle of R-Bendamustine.  She's doing well today.  She denies fevers, chills, nausea, vomiting, headache, shortness of breath or any other concerns.  A 10 point ROS is neg.   MEDICAL HISTORY: Past Medical History  Diagnosis Date  . Arthritis   . Blood transfusion 2009  . Breast CA 08/2008    (LT) breast ca dx 10/09/Chemo  . Lymphoma 09/09/2011    NHL  . Leukemia, acute, in remission 2012    Pt. not sure of type  . Hypertension   . Seizures 1980's    from medication reaction/Pt.  [name] Hx of radiation therapy 09/11/08 -10/31/08    left breast  . Metastasis to bone 07/03/2012    MRI L spine  . History of radiation therapy eot 07/30/12    lumbar spine L4 /l shoulder    ALLERGIES:  is allergic to codeine; penicillins;  sulfonamide derivatives; tape; and tramadol hcl.  MEDICATIONS:  Current Outpatient Prescriptions  Medication Sig Dispense Refill  . anastrozole (ARIMIDEX) 1 MG tablet Take 1 tablet (1 mg total) by mouth daily.  30 tablet  6  . oxycodone (OXY-IR) 5 MG capsule Take 1 capsule (5 mg total) by mouth every 4 (four) hours as needed.  90 capsule  0  . potassium chloride SA (K-DUR,KLOR-CON) 20 MEQ tablet Take 1 tablet (20 mEq total) by mouth daily.  30 tablet  0  . calcium-vitamin D (OSCAL WITH D) 500-200 MG-UNIT per tablet Take 2 tablets by mouth daily.      [name] omeprazole (PRILOSEC) 40 MG capsule Take 1 capsule (40 mg total) by mouth daily.  30 capsule  2    SURGICAL HISTORY:  Past Surgical History  Procedure Date  . Mastectomy   . Thyroidectomy 1965  . Appendectomy   . Tonsillectomy     REVIEW OF SYSTEMS:  General: fatigue (-), night sweats (-), fever (-), pain (-) Lymph: palpable nodes (-) HEENT: vision changes (-), mucositis (-), gum bleeding (-), epistaxis (-) Cardiovascular: chest pain (-), palpitations (-) Pulmonary: shortness of breath (-), dyspnea on exertion (-), cough (-), hemoptysis (-) GI:  Early satiety (-), melena (-), dysphagia (-), nausea/vomiting (-), diarrhea (-) GU: dysuria (-), hematuria (-), incontinence (-) Musculoskeletal: joint swelling (-), joint pain (-), back pain (-) Neuro: weakness (-), numbness (-), headache (-), confusion (-)  Skin: Rash (-), lesions (-), dryness (-) Psych: depression (-), suicidal/homicidal ideation (-), feeling of hopelessness (-)   PHYSICAL EXAMINATION:  BP 107/66  Pulse 65  Temp 98.3 F (36.8 C) (Oral)  Resp 20  Ht 5\' 2"  (1.575 m)  Wt 131 lb 3.2 oz (59.512 kg)  BMI 24.00 kg/m2 General: Patient is a well appearing female in no acute distress HEENT: PERRLA, sclerae anicteric no conjunctival pallor, MMM,  Neck: supple, no palpable adenopathy Lungs: clear to auscultation bilaterally, no wheezes, rhonchi, or rales Cardiovascular:  regular rate rhythm, S1, S2, no murmurs, rubs or gallops Abdomen: Soft, mild epigastric tenderness, non-distended, normoactive bowel sounds, no HSM Extremities: warm and well perfused, no clubbing, cyanosis, or edema Skin: No rashes or lesions Neuro: Non-focal ECOG PERFORMANCE STATUS: 1 - Symptomatic but completely ambulatory  LABORATORY DATA: Lab Results  Component Value Date   WBC 3.9 12/02/2012   HGB 11.8 12/02/2012   HCT 37.0 12/02/2012   MCV 89.4 12/02/2012   PLT 157 12/02/2012      Chemistry      Component Value Date/Time   NA 141 11/18/2012 0810   NA 136 07/06/2012 1122   NA 142 06/01/2012 1008   K 3.9 11/18/2012 0810   K 3.8 07/06/2012 1122   K 3.8 06/01/2012 1008   CL 107 11/18/2012 0810   CL 102 07/06/2012 1122   CL 103 06/01/2012 1008   CO2 25 11/18/2012 0810   CO2 20 07/06/2012 1122   CO2 25 06/01/2012 1008   BUN 7.0 11/18/2012 0810   BUN 8 07/06/2012 1122   BUN 9 06/01/2012 1008   CREATININE 0.7 11/18/2012 0810   CREATININE 0.60 07/06/2012 1122   CREATININE 0.9 06/01/2012 1008      Component Value Date/Time   CALCIUM 9.6 11/18/2012 0810   CALCIUM 9.1 07/06/2012 1122   CALCIUM 9.8 06/01/2012 1008   ALKPHOS 110 11/18/2012 0810   ALKPHOS 69 02/10/2012 1035   ALKPHOS 78 08/14/2010 1251   AST 18 11/18/2012 0810   AST 20 02/10/2012 1035   AST 19 08/14/2010 1251   ALT <6 Repeated and Verified 11/18/2012 0810   ALT 19 02/10/2012 1035   BILITOT 0.31 11/18/2012 0810   BILITOT 0.4 02/10/2012 1035   BILITOT 0.40 08/14/2010 1251       RADIOGRAPHIC STUDIES: NUCLEAR MEDICINE PET CT SKULL BASE TO THIGH  Technique: Technique: 18.8 mCi F-18 FDG was injected  intravenously. CT data was obtained and used for attenuation  correction and anatomic localization only. (This was not acquired  as a diagnostic CT examination.) Additional exam technical data  entered on technologist worksheet.  Comparison: PET of 02/07/2011. CTs of 06/01/2012.  Findings: Neck: Hypermetabolism within the left side of the   mandible, with suspicion of concurrent periapical focal lucency on  image 24.  Bilateral cervical hypermetabolic lymph nodes. Hypermetabolism  which is primarily felt to be nodal surrounds the right jugular  vein and extends into the thoracic inlet. This measures a S.U.V.  max of 9.9 on image 57  Chest: Hypermetabolic right axillary node. This measures 1.7 cm  and a S.U.V. max of 6.1 on image 82. On the prior PET, this node  measured 1.7 cm and a S.U.V. max of 3.1.  Small prevascular nodes which are hypermetabolic, including on  image 72.  Abdomen/Pelvis: Right adrenal hypermetabolism which is without CT  correlate. Hypermetabolic left external iliac adenopathy. This  measures 1.3 cm and a S.U.V. max of 5.0 on image 181. On the  prior  PET, this node measured 1.6 cm and on the order of a S.U.V. max of  2.6.  Skelton: Multifocal marrow hypermetabolism consistent with osseous  involvement. Index lesion in the right humeral head measures a  S.U.V. max of 14.4 on image 53 and is new.  A hypermetabolic lytic lesion within the left iliac wing is new or  progressive and measures a S.U.V. max of 8.1 on image 155.  CT images performed for attenuation correction demonstrate  increased number and size of lymph nodes within the neck. Index  right jugulodigastric node measures 9 mm short axis on image 34  versus 6 mm on the prior PET. Chest, abdomen, and pelvic findings  deferred to recent diagnostic CTs. Trace right-sided pleural fluid  is new. Left upper lobe radiation fibrosis. Punctate left renal  calculi. Cholelithiasis. Fibroid uterus. L4 osseous metastasis  has possible epidural component on image 144 transverse.  IMPRESSION:  1. Increasing hypermetabolism associated with adenopathy within  the neck, chest, and pelvis, as detailed above. Findings are most  likely indicative of progression of lymphoma.  2. Progressive osseous metastasis, as detailed above. An L4 lesion  has possible  epidural component. Consider pre and post contrast  lumbar spine MRI.  3. Hypermetabolism at the right lower neck and thoracic inlet  surrounds the right internal jugular vein. This appears somewhat  hyperattenuating. Cannot exclude thrombus. Consider right upper  extremity venous ultrasound with attention to this area.  4. Likely dental inflammatory left mandibular hypermetabolism.  Consider physical exam correlation.   ASSESSMENT: 78 year old female with  #1Patient with history of low-grade non-Hodgkin lymphoma she initially received CVP Rituxan and then subsequently on maintenance Rituxan, now on R Bendamustine.  She had a PET/CT on 1/10 which demonstrated response to her therapy. She will receive a total of 6 cycles R Bendamustine and the we will proceed with maintenance Rituxan.  #2 metastatic breast carcinoma originally diagnosed in 2009 originally on letrozole 2.5 mg daily, now on Arimidex.  #3 L4 bone metastasis which is painful. Patient is now status post palliative radiation therapy.  Also receiving Xgeva q28 days.     PLAN:  #1 Ms. Cabell is doing well today.  She will proceed with chemotherapy today.    #2 She is taking her Arimidex and tolerating it pretty well.  She will receive her Xgeva next month.   #3 she will return on 2/6 for labs, appt, and treatment.   All questions were answered. The patient knows to call the clinic with any problems, questions or concerns. We can certainly see the patient much sooner if necessary.  I spent 15 minutes counseling the patient face to face. The total time spent in the appointment was 30 minutes.  This case was reviewed with Dr. Welton Flakes.    Cherie Ouch Lyn Hollingshead, NP Medical Oncology [facility] Phone: [phone] 12/02/2012, 1:20 PM

## 2012-12-02 NOTE — Patient Instructions (Addendum)
Doing well.  Proceed with chemotherapy.  Please call us if you have any questions or concerns.    We will see you back next week.   

## 2012-12-03 ENCOUNTER — Ambulatory Visit (HOSPITAL_BASED_OUTPATIENT_CLINIC_OR_DEPARTMENT_OTHER): Payer: Medicare Other

## 2012-12-03 ENCOUNTER — Telehealth: Payer: Self-pay | Admitting: *Deleted

## 2012-12-03 VITALS — BP 125/68 | HR 52 | Temp 96.8°F | Resp 20

## 2012-12-03 DIAGNOSIS — C8589 Other specified types of non-Hodgkin lymphoma, extranodal and solid organ sites: Secondary | ICD-10-CM

## 2012-12-03 DIAGNOSIS — C8590 Non-Hodgkin lymphoma, unspecified, unspecified site: Secondary | ICD-10-CM

## 2012-12-03 DIAGNOSIS — Z5111 Encounter for antineoplastic chemotherapy: Secondary | ICD-10-CM

## 2012-12-03 MED ORDER — SODIUM CHLORIDE 0.9 % IV SOLN
70.0000 mg/m2 | Freq: Once | INTRAVENOUS | Status: AC
  Administered 2012-12-03: 115 mg via INTRAVENOUS
  Filled 2012-12-03: qty 23

## 2012-12-03 MED ORDER — SODIUM CHLORIDE 0.9 % IV SOLN
Freq: Once | INTRAVENOUS | Status: AC
  Administered 2012-12-03: 13:00:00 via INTRAVENOUS

## 2012-12-03 MED ORDER — DEXAMETHASONE SODIUM PHOSPHATE 10 MG/ML IJ SOLN
10.0000 mg | Freq: Once | INTRAMUSCULAR | Status: AC
  Administered 2012-12-03: 10 mg via INTRAVENOUS

## 2012-12-03 MED ORDER — HEPARIN SOD (PORK) LOCK FLUSH 100 UNIT/ML IV SOLN
500.0000 [IU] | Freq: Once | INTRAVENOUS | Status: AC | PRN
  Administered 2012-12-03: 500 [IU]
  Filled 2012-12-03: qty 5

## 2012-12-03 MED ORDER — SODIUM CHLORIDE 0.9 % IJ SOLN
10.0000 mL | INTRAMUSCULAR | Status: DC | PRN
  Administered 2012-12-03: 10 mL
  Filled 2012-12-03: qty 10

## 2012-12-03 MED ORDER — ONDANSETRON 8 MG/50ML IVPB (CHCC)
8.0000 mg | Freq: Once | INTRAVENOUS | Status: AC
  Administered 2012-12-03: 8 mg via INTRAVENOUS

## 2012-12-03 NOTE — Telephone Encounter (Signed)
Per scheduler the MD visit moved up, I have scheduled appt. JMW

## 2012-12-03 NOTE — Patient Instructions (Signed)
[facility] Discharge Instructions for Patients Receiving Chemotherapy  Today you received the following chemotherapy agents Treanda To help prevent nausea and vomiting after your treatment, we encourage you to take your nausea medication as prescribed.If you develop nausea and vomiting that is not controlled by your nausea medication, call the clinic. If it is after clinic hours your family physician or the after hours number for the clinic or go to the Emergency Department.   BELOW ARE SYMPTOMS THAT SHOULD BE REPORTED IMMEDIATELY:  *FEVER GREATER THAN 100.5 F  *CHILLS WITH OR WITHOUT FEVER  NAUSEA AND VOMITING THAT IS NOT CONTROLLED WITH YOUR NAUSEA MEDICATION  *UNUSUAL SHORTNESS OF BREATH  *UNUSUAL BRUISING OR BLEEDING  TENDERNESS IN MOUTH AND THROAT WITH OR WITHOUT PRESENCE OF ULCERS  *URINARY PROBLEMS  *BOWEL PROBLEMS  UNUSUAL RASH Items with * indicate a potential emergency and should be followed up as soon as possible.  One of the nurses will contact you 24 hours after your treatment. Please let the nurse know about any problems that you may have experienced. Feel free to call the clinic you have any questions or concerns. The clinic phone number is [phone].   I have been informed and understand all the instructions given to me. I know to contact the clinic, my physician, or go to the Emergency Department if any problems should occur. I do not have any questions at this time, but understand that I may call the clinic during office hours   should I have any questions or need assistance in obtaining follow up care.    __________________________________________  _____________  __________ Signature of Patient or Authorized Representative            Date                   Time    __________________________________________ Nurse's Signature    

## 2012-12-04 ENCOUNTER — Ambulatory Visit (HOSPITAL_BASED_OUTPATIENT_CLINIC_OR_DEPARTMENT_OTHER): Payer: Medicare Other

## 2012-12-04 VITALS — BP 157/72 | HR 60 | Temp 97.5°F

## 2012-12-04 DIAGNOSIS — C8590 Non-Hodgkin lymphoma, unspecified, unspecified site: Secondary | ICD-10-CM

## 2012-12-04 DIAGNOSIS — C8589 Other specified types of non-Hodgkin lymphoma, extranodal and solid organ sites: Secondary | ICD-10-CM

## 2012-12-04 MED ORDER — PEGFILGRASTIM INJECTION 6 MG/0.6ML
6.0000 mg | Freq: Once | SUBCUTANEOUS | Status: AC
  Administered 2012-12-04: 6 mg via SUBCUTANEOUS

## 2012-12-09 ENCOUNTER — Ambulatory Visit (HOSPITAL_BASED_OUTPATIENT_CLINIC_OR_DEPARTMENT_OTHER): Payer: Medicare Other

## 2012-12-09 ENCOUNTER — Encounter: Payer: Self-pay | Admitting: Adult Health

## 2012-12-09 ENCOUNTER — Other Ambulatory Visit: Payer: Medicare Other | Admitting: Lab

## 2012-12-09 ENCOUNTER — Ambulatory Visit (HOSPITAL_BASED_OUTPATIENT_CLINIC_OR_DEPARTMENT_OTHER): Payer: Medicare Other | Admitting: Adult Health

## 2012-12-09 VITALS — BP 144/75 | HR 84 | Temp 98.2°F | Resp 20 | Ht 62.0 in | Wt 131.2 lb

## 2012-12-09 DIAGNOSIS — C8590 Non-Hodgkin lymphoma, unspecified, unspecified site: Secondary | ICD-10-CM

## 2012-12-09 DIAGNOSIS — C7951 Secondary malignant neoplasm of bone: Secondary | ICD-10-CM

## 2012-12-09 DIAGNOSIS — C50919 Malignant neoplasm of unspecified site of unspecified female breast: Secondary | ICD-10-CM

## 2012-12-09 DIAGNOSIS — C8589 Other specified types of non-Hodgkin lymphoma, extranodal and solid organ sites: Secondary | ICD-10-CM

## 2012-12-09 DIAGNOSIS — C7952 Secondary malignant neoplasm of bone marrow: Secondary | ICD-10-CM

## 2012-12-09 LAB — CBC WITH DIFFERENTIAL/PLATELET
BASO%: 0.9 % (ref 0.0–2.0)
HCT: 35.5 % (ref 34.8–46.6)
LYMPH%: 0.6 % — ABNORMAL LOW (ref 14.0–49.7)
MCH: 29.2 pg (ref 25.1–34.0)
MCHC: 33.2 g/dL (ref 31.5–36.0)
MCV: 87.9 fL (ref 79.5–101.0)
MONO#: 3.7 10*3/uL — ABNORMAL HIGH (ref 0.1–0.9)
MONO%: 15.4 % — ABNORMAL HIGH (ref 0.0–14.0)
NEUT%: 81.1 % — ABNORMAL HIGH (ref 38.4–76.8)
Platelets: 162 10*3/uL (ref 145–400)
RBC: 4.04 10*6/uL (ref 3.70–5.45)
WBC: 23.7 10*3/uL — ABNORMAL HIGH (ref 3.9–10.3)

## 2012-12-09 LAB — COMPREHENSIVE METABOLIC PANEL (CC13)
ALT: 8 U/L (ref 0–55)
AST: 21 U/L (ref 5–34)
Alkaline Phosphatase: 146 U/L (ref 40–150)
Creatinine: 0.7 mg/dL (ref 0.6–1.1)
Sodium: 140 mEq/L (ref 136–145)
Total Bilirubin: 0.23 mg/dL (ref 0.20–1.20)
Total Protein: 6.6 g/dL (ref 6.4–8.3)

## 2012-12-09 MED ORDER — DENOSUMAB 120 MG/1.7ML ~~LOC~~ SOLN
120.0000 mg | Freq: Once | SUBCUTANEOUS | Status: AC
  Administered 2012-12-09: 120 mg via SUBCUTANEOUS
  Filled 2012-12-09: qty 1.7

## 2012-12-09 NOTE — Progress Notes (Signed)
OFFICE PROGRESS NOTE  CC  Helen Linger, MD [address] [facility] [address] [place], [address] [place] [address]  DIAGNOSIS: 78 year old female with:  #1 left breast carcinoma originally presenting as a fungating mass. Patient underwent neoadjuvant chemotherapy followed by mastectomy. She is currently on Arimidex 1mg  daily.   #2 low-grade non-Hodgkin lymphoma status post CVP and Rituxan now receiving R-Bendamustine.   PRIOR THERAPY:  #1 the patient was diagnosed with metastatic breast carcinoma beginning October 2009. She returned her 1 chemotherapy followed by mastectomy radiation. She was on letrozole 2.5 mg daily, but has changed to Arimidex 1mg  daily.  Also receives Xgeva every 28 days.    #2 Was diagnosed with low-grade non-Hodgkin lymphoma  12. She received 6 cycles of CVP and Rituxan, followed by maintenance Rituxan, who recurred and is now on R-Bendamustine.    #3 Pt underwent 4 cycles of R-Bendamustin PET/CT on 1/10 showed CR to therapy, will complete 2 cycles of treatment and the proceed to maintenance Rituxan.  CURRENT THERAPY:  Xgeva and R-Bendamustine cycle 5 day 8.    INTERVAL HISTORY: Helen [name] 78 y.o. female returns for evaluation after her fifth cycle of R-Bendamustine.  She continues to do well.  Her labs are stable.  She is requesting clarification regarding the necessity of her scans after the completion of her therapy.  I reinforced with Helen [name] that we need to ensure that the lymphoma is gone before we proceed with maintenance Rituximab.    MEDICAL HISTORY: Past Medical History  Diagnosis Date  . Arthritis   . Blood transfusion 2009  . Breast CA 08/2008    (LT) breast ca dx 10/09/Chemo  . Lymphoma 09/09/2011    NHL  . Leukemia, acute, in remission 2012    Pt. not sure of type  . Hypertension   . Seizures 1980's    from medication reaction/Pt.  [name] Hx of radiation therapy 09/11/08 -10/31/08    left breast  . Metastasis to bone 07/03/2012    MRI L  spine  . History of radiation therapy eot 07/30/12    lumbar spine L4 /l shoulder    ALLERGIES:  is allergic to codeine; penicillins; sulfonamide derivatives; tape; and tramadol hcl.  MEDICATIONS:  Current Outpatient Prescriptions  Medication Sig Dispense Refill  . anastrozole (ARIMIDEX) 1 MG tablet Take 1 tablet (1 mg total) by mouth daily.  30 tablet  6  . calcium-vitamin D (OSCAL WITH D) 500-200 MG-UNIT per tablet Take 2 tablets by mouth daily.      [name] oxycodone (OXY-IR) 5 MG capsule Take 1 capsule (5 mg total) by mouth every 4 (four) hours as needed.  90 capsule  0  . potassium chloride SA (K-DUR,KLOR-CON) 20 MEQ tablet Take 1 tablet (20 mEq total) by mouth daily.  30 tablet  0  . omeprazole (PRILOSEC) 40 MG capsule Take 1 capsule (40 mg total) by mouth daily.  30 capsule  2    SURGICAL HISTORY:  Past Surgical History  Procedure Date  . Mastectomy   . Thyroidectomy 1965  . Appendectomy   . Tonsillectomy     REVIEW OF SYSTEMS:  General: fatigue (-), night sweats (-), fever (-), pain (-) Lymph: palpable nodes (-) HEENT: vision changes (-), mucositis (-), gum bleeding (-), epistaxis (-) Cardiovascular: chest pain (-), palpitations (-) Pulmonary: shortness of breath (-), dyspnea on exertion (-), cough (-), hemoptysis (-) GI:  Early satiety (-), melena (-), dysphagia (-), nausea/vomiting (-), diarrhea (-) GU: dysuria (-),  hematuria (-), incontinence (-) Musculoskeletal: joint swelling (-), joint pain (-), back pain (-) Neuro: weakness (-), numbness (-), headache (-), confusion (-) Skin: Rash (-), lesions (-), dryness (-) Psych: depression (-), suicidal/homicidal ideation (-), feeling of hopelessness (-)   PHYSICAL EXAMINATION:  BP 144/75  Pulse 84  Temp 98.2 F (36.8 C)  Resp 20  Ht 5\' 2"  (1.575 m)  Wt 131 lb 3.2 oz (59.512 kg)  BMI 24.00 kg/m2 General: Patient is a well appearing female in no acute distress HEENT: PERRLA, sclerae anicteric no conjunctival pallor, MMM,   Neck: supple, no palpable adenopathy Lungs: clear to auscultation bilaterally, no wheezes, rhonchi, or rales Cardiovascular: regular rate rhythm, S1, S2, no murmurs, rubs or gallops Abdomen: Soft, mild epigastric tenderness, non-distended, normoactive bowel sounds, no HSM Extremities: warm and well perfused, no clubbing, cyanosis, or edema Skin: No rashes or lesions Neuro: Non-focal ECOG PERFORMANCE STATUS: 1 - Symptomatic but completely ambulatory  LABORATORY DATA: Lab Results  Component Value Date   WBC 23.7* 12/09/2012   HGB 11.8 12/09/2012   HCT 35.5 12/09/2012   MCV 87.9 12/09/2012   PLT 162 12/09/2012      Chemistry      Component Value Date/Time   NA 140 12/09/2012 1052   NA 136 07/06/2012 1122   NA 142 06/01/2012 1008   K 3.8 12/09/2012 1052   K 3.8 07/06/2012 1122   K 3.8 06/01/2012 1008   CL 104 12/09/2012 1052   CL 102 07/06/2012 1122   CL 103 06/01/2012 1008   CO2 26 12/09/2012 1052   CO2 20 07/06/2012 1122   CO2 25 06/01/2012 1008   BUN 7.2 12/09/2012 1052   BUN 8 07/06/2012 1122   BUN 9 06/01/2012 1008   CREATININE 0.7 12/09/2012 1052   CREATININE 0.60 07/06/2012 1122   CREATININE 0.9 06/01/2012 1008      Component Value Date/Time   CALCIUM 9.6 12/09/2012 1052   CALCIUM 9.1 07/06/2012 1122   CALCIUM 9.8 06/01/2012 1008   ALKPHOS 146 12/09/2012 1052   ALKPHOS 69 02/10/2012 1035   ALKPHOS 78 08/14/2010 1251   AST 21 12/09/2012 1052   AST 20 02/10/2012 1035   AST 19 08/14/2010 1251   ALT 8 12/09/2012 1052   ALT 19 02/10/2012 1035   BILITOT 0.23 12/09/2012 1052   BILITOT 0.4 02/10/2012 1035   BILITOT 0.40 08/14/2010 1251       RADIOGRAPHIC STUDIES: NUCLEAR MEDICINE PET CT SKULL BASE TO THIGH  Technique: Technique: 18.8 mCi F-18 FDG was injected  intravenously. CT data was obtained and used for attenuation  correction and anatomic localization only. (This was not acquired  as a diagnostic CT examination.) Additional exam technical data  entered on technologist worksheet.  Comparison: PET of  02/07/2011. CTs of 06/01/2012.  Findings: Neck: Hypermetabolism within the left side of the  mandible, with suspicion of concurrent periapical focal lucency on  image 24.  Bilateral cervical hypermetabolic lymph nodes. Hypermetabolism  which is primarily felt to be nodal surrounds the right jugular  vein and extends into the thoracic inlet. This measures a S.U.V.  max of 9.9 on image 57  Chest: Hypermetabolic right axillary node. This measures 1.7 cm  and a S.U.V. max of 6.1 on image 82. On the prior PET, this node  measured 1.7 cm and a S.U.V. max of 3.1.  Small prevascular nodes which are hypermetabolic, including on  image 72.  Abdomen/Pelvis: Right adrenal hypermetabolism which is without CT  correlate. Hypermetabolic left  external iliac adenopathy. This  measures 1.3 cm and a S.U.V. max of 5.0 on image 181. On the prior  PET, this node measured 1.6 cm and on the order of a S.U.V. max of  2.6.  Skelton: Multifocal marrow hypermetabolism consistent with osseous  involvement. Index lesion in the right humeral head measures a  S.U.V. max of 14.4 on image 53 and is new.  A hypermetabolic lytic lesion within the left iliac wing is new or  progressive and measures a S.U.V. max of 8.1 on image 155.  CT images performed for attenuation correction demonstrate  increased number and size of lymph nodes within the neck. Index  right jugulodigastric node measures 9 mm short axis on image 34  versus 6 mm on the prior PET. Chest, abdomen, and pelvic findings  deferred to recent diagnostic CTs. Trace right-sided pleural fluid  is new. Left upper lobe radiation fibrosis. Punctate left renal  calculi. Cholelithiasis. Fibroid uterus. L4 osseous metastasis  has possible epidural component on image 144 transverse.  IMPRESSION:  1. Increasing hypermetabolism associated with adenopathy within  the neck, chest, and pelvis, as detailed above. Findings are most  likely indicative of progression of  lymphoma.  2. Progressive osseous metastasis, as detailed above. An L4 lesion  has possible epidural component. Consider pre and post contrast  lumbar spine MRI.  3. Hypermetabolism at the right lower neck and thoracic inlet  surrounds the right internal jugular vein. This appears somewhat  hyperattenuating. Cannot exclude thrombus. Consider right upper  extremity venous ultrasound with attention to this area.  4. Likely dental inflammatory left mandibular hypermetabolism.  Consider physical exam correlation.   ASSESSMENT: 78 year old female with  #1Patient with history of low-grade non-Hodgkin lymphoma she initially received CVP Rituxan and then subsequently on maintenance Rituxan, now on R Bendamustine.  She had a PET/CT on 1/10 which demonstrated response to her therapy. She will receive a total of 6 cycles R Bendamustine and the we will proceed with maintenance Rituxan.  #2 metastatic breast carcinoma originally diagnosed in 2009 originally on letrozole 2.5 mg daily, now on Arimidex.  #3 L4 bone metastasis which is painful. Patient is now status post palliative radiation therapy.  Also receiving Xgeva q28 days.     PLAN:  #1 Helen [name] is doing well today. Her labs are stable.  I reinforced why she needs to have scans after cycle six and she was agreeable.  I put in a request for the CT schedulers to call her again.    #2 She is taking her Arimidex and tolerating it pretty well.  She will receive her Rivka Barbara today.   #3 she will return on 2/27 for labs/appt/chemo.   All questions were answered. The patient knows to call the clinic with any problems, questions or concerns. We can certainly see the patient much sooner if necessary.  I spent 15 minutes counseling the patient face to face. The total time spent in the appointment was 30 minutes.  This case was reviewed with Dr. Welton Flakes.    Cherie Ouch Lyn Hollingshead, NP Medical Oncology [facility] Phone: [phone] 12/09/2012, 12:50 PM

## 2012-12-09 NOTE — Patient Instructions (Signed)
Doing well.  Proceed with Xgeva today.

## 2012-12-10 ENCOUNTER — Ambulatory Visit: Payer: Medicare Other

## 2012-12-30 ENCOUNTER — Ambulatory Visit (HOSPITAL_BASED_OUTPATIENT_CLINIC_OR_DEPARTMENT_OTHER): Payer: Medicare Other

## 2012-12-30 ENCOUNTER — Encounter: Payer: Self-pay | Admitting: Adult Health

## 2012-12-30 ENCOUNTER — Ambulatory Visit: Payer: Medicare Other | Admitting: Adult Health

## 2012-12-30 ENCOUNTER — Other Ambulatory Visit: Payer: Medicare Other | Admitting: Lab

## 2012-12-30 ENCOUNTER — Ambulatory Visit (HOSPITAL_BASED_OUTPATIENT_CLINIC_OR_DEPARTMENT_OTHER): Payer: Medicare Other | Admitting: Adult Health

## 2012-12-30 ENCOUNTER — Other Ambulatory Visit (HOSPITAL_BASED_OUTPATIENT_CLINIC_OR_DEPARTMENT_OTHER): Payer: Medicare Other | Admitting: Lab

## 2012-12-30 VITALS — BP 146/63 | HR 61 | Temp 98.0°F | Resp 16

## 2012-12-30 VITALS — BP 130/74 | HR 70 | Temp 98.2°F | Resp 20 | Ht 62.0 in | Wt 132.5 lb

## 2012-12-30 DIAGNOSIS — C8589 Other specified types of non-Hodgkin lymphoma, extranodal and solid organ sites: Secondary | ICD-10-CM

## 2012-12-30 DIAGNOSIS — L309 Dermatitis, unspecified: Secondary | ICD-10-CM

## 2012-12-30 DIAGNOSIS — Z5112 Encounter for antineoplastic immunotherapy: Secondary | ICD-10-CM

## 2012-12-30 DIAGNOSIS — C8590 Non-Hodgkin lymphoma, unspecified, unspecified site: Secondary | ICD-10-CM

## 2012-12-30 DIAGNOSIS — C7952 Secondary malignant neoplasm of bone marrow: Secondary | ICD-10-CM

## 2012-12-30 DIAGNOSIS — Z5111 Encounter for antineoplastic chemotherapy: Secondary | ICD-10-CM

## 2012-12-30 DIAGNOSIS — C50919 Malignant neoplasm of unspecified site of unspecified female breast: Secondary | ICD-10-CM

## 2012-12-30 DIAGNOSIS — R21 Rash and other nonspecific skin eruption: Secondary | ICD-10-CM

## 2012-12-30 LAB — CBC WITH DIFFERENTIAL/PLATELET
BASO%: 0.4 % (ref 0.0–2.0)
EOS%: 7.9 % — ABNORMAL HIGH (ref 0.0–7.0)
HCT: 38.4 % (ref 34.8–46.6)
LYMPH%: 4.7 % — ABNORMAL LOW (ref 14.0–49.7)
MCH: 28.5 pg (ref 25.1–34.0)
MCHC: 32 g/dL (ref 31.5–36.0)
NEUT%: 67.7 % (ref 38.4–76.8)
RBC: 4.32 10*6/uL (ref 3.70–5.45)
lymph#: 0.2 10*3/uL — ABNORMAL LOW (ref 0.9–3.3)
nRBC: 0 % (ref 0–0)

## 2012-12-30 LAB — COMPREHENSIVE METABOLIC PANEL (CC13)
AST: 17 U/L (ref 5–34)
Alkaline Phosphatase: 73 U/L (ref 40–150)
BUN: 9 mg/dL (ref 7.0–26.0)
Creatinine: 0.7 mg/dL (ref 0.6–1.1)
Potassium: 3.7 mEq/L (ref 3.5–5.1)

## 2012-12-30 MED ORDER — ACETAMINOPHEN 325 MG PO TABS
650.0000 mg | ORAL_TABLET | Freq: Once | ORAL | Status: AC
  Administered 2012-12-30: 650 mg via ORAL

## 2012-12-30 MED ORDER — SODIUM CHLORIDE 0.9 % IV SOLN
Freq: Once | INTRAVENOUS | Status: AC
  Administered 2012-12-30: 12:00:00 via INTRAVENOUS

## 2012-12-30 MED ORDER — PREDNISONE (PAK) 10 MG PO TABS: 10.0000 mg | ORAL_TABLET | Freq: Every day | ORAL | Status: DC

## 2012-12-30 MED ORDER — SODIUM CHLORIDE 0.9 % IV SOLN
375.0000 mg/m2 | Freq: Once | INTRAVENOUS | Status: AC
  Administered 2012-12-30: 600 mg via INTRAVENOUS
  Filled 2012-12-30: qty 60

## 2012-12-30 MED ORDER — ONDANSETRON 8 MG/50ML IVPB (CHCC)
8.0000 mg | Freq: Once | INTRAVENOUS | Status: AC
  Administered 2012-12-30: 8 mg via INTRAVENOUS

## 2012-12-30 MED ORDER — SODIUM CHLORIDE 0.9 % IJ SOLN
10.0000 mL | INTRAMUSCULAR | Status: DC | PRN
  Administered 2012-12-30: 10 mL
  Filled 2012-12-30: qty 10

## 2012-12-30 MED ORDER — HEPARIN SOD (PORK) LOCK FLUSH 100 UNIT/ML IV SOLN
500.0000 [IU] | Freq: Once | INTRAVENOUS | Status: AC | PRN
  Administered 2012-12-30: 500 [IU]
  Filled 2012-12-30: qty 5

## 2012-12-30 MED ORDER — DIPHENHYDRAMINE HCL 25 MG PO CAPS
50.0000 mg | ORAL_CAPSULE | Freq: Once | ORAL | Status: AC
  Administered 2012-12-30: 50 mg via ORAL

## 2012-12-30 MED ORDER — DEXAMETHASONE SODIUM PHOSPHATE 10 MG/ML IJ SOLN
10.0000 mg | Freq: Once | INTRAMUSCULAR | Status: AC
  Administered 2012-12-30: 10 mg via INTRAVENOUS

## 2012-12-30 MED ORDER — SODIUM CHLORIDE 0.9 % IV SOLN
70.0000 mg/m2 | Freq: Once | INTRAVENOUS | Status: AC
  Administered 2012-12-30: 115 mg via INTRAVENOUS
  Filled 2012-12-30: qty 23

## 2012-12-30 NOTE — Patient Instructions (Addendum)
[facility] Discharge Instructions for Patients Receiving Chemotherapy  Today you received the following chemotherapy agents : Rituxan and Treanda   IF you develop nausea please call the clinic.   BELOW ARE SYMPTOMS THAT SHOULD BE REPORTED IMMEDIATELY:  *FEVER GREATER THAN 100.5 F  *CHILLS WITH OR WITHOUT FEVER  NAUSEA AND VOMITING THAT IS NOT CONTROLLED WITH YOUR NAUSEA MEDICATION  *UNUSUAL SHORTNESS OF BREATH  *UNUSUAL BRUISING OR BLEEDING  TENDERNESS IN MOUTH AND THROAT WITH OR WITHOUT PRESENCE OF ULCERS  *URINARY PROBLEMS  *BOWEL PROBLEMS  UNUSUAL RASH Items with * indicate a potential emergency and should be followed up as soon as possible.   Feel free to call the clinic you have any questions or concerns. The clinic phone number is [phone].   I have been informed and understand all the instructions given to me. I know to contact the clinic, my physician, or go to the Emergency Department if any problems should occur. I do not have any questions at this time, but understand that I may call the clinic during office hours   should I have any questions or need assistance in obtaining follow up care.    __________________________________________  _____________  __________ Signature of Patient or Authorized Representative            Date                   Time    __________________________________________ Nurse's Signature

## 2012-12-30 NOTE — Progress Notes (Signed)
OFFICE PROGRESS NOTE  CC  [name], MD [address] [facility] [address] [place], [address] [place] [address]  DIAGNOSIS: 78 year old female with:  #1 left breast carcinoma originally presenting as a fungating mass. Patient underwent neoadjuvant chemotherapy followed by mastectomy. She is currently on Arimidex 1mg  daily.   #2 low-grade non-Hodgkin lymphoma status post CVP and Rituxan now receiving R-Bendamustine.   PRIOR THERAPY:  #1 the patient was diagnosed with metastatic breast carcinoma beginning October 2009. She returned her 1 chemotherapy followed by mastectomy radiation. She was on letrozole 2.5 mg daily, but has changed to Arimidex 1mg  daily.  Also receives Xgeva every 28 days.    #2 Was diagnosed with low-grade non-Hodgkin lymphoma  12. She received 6 cycles of CVP and Rituxan, followed by maintenance Rituxan, who recurred and is now on R-Bendamustine.    #3 Pt underwent 4 cycles of R-Bendamustin PET/CT on 1/10 showed CR to therapy, will complete 2 cycles of treatment and the proceed to maintenance Rituxan.  CURRENT THERAPY:  Xgeva and R-Bendamustine cycle 6 day 1.    INTERVAL HISTORY: Helen [name] 78 y.o. female returns for evaluation before her sixth cycle of chemotherapy.  She is doing well today.  She is c/o redness in her eyes after taking an eye drop that her ophthalmologist prescribed.  She does not have a f/u appt with them.  Also, she has redness and itching on her bilateral upper extremities that started after she put a lotion on her arms.  She cannot tell me if it is worse or better, or when it started.  She has been putting dry skin lotion on her arms and it is not helping.  Otherwise, she denies fevers, chills, nausea, vomiting, constipation, numbness, or any other concerns.    MEDICAL HISTORY: Past Medical History  Diagnosis Date  . Arthritis   . Blood transfusion 2009  . Breast CA 08/2008    (LT) breast ca dx 10/09/Chemo  . Lymphoma 09/09/2011    NHL  .  Leukemia, acute, in remission 2012    Pt. not sure of type  . Hypertension   . Seizures 1980's    from medication reaction/Pt.  [name] Hx of radiation therapy 09/11/08 -10/31/08    left breast  . Metastasis to bone 07/03/2012    MRI L spine  . History of radiation therapy eot 07/30/12    lumbar spine L4 /l shoulder    ALLERGIES:  is allergic to codeine; penicillins; sulfonamide derivatives; tape; and tramadol hcl.  MEDICATIONS:  Current Outpatient Prescriptions  Medication Sig Dispense Refill  . anastrozole (ARIMIDEX) 1 MG tablet Take 1 tablet (1 mg total) by mouth daily.  30 tablet  6  . calcium-vitamin D (OSCAL WITH D) 500-200 MG-UNIT per tablet Take 2 tablets by mouth daily.      [name] oxycodone (OXY-IR) 5 MG capsule Take 1 capsule (5 mg total) by mouth every 4 (four) hours as needed.  90 capsule  0  . predniSONE (STERAPRED UNI-PAK) 10 MG tablet Take 1 tablet (10 mg total) by mouth daily. 60mg POx1day,50mg POx1 day,40mg POx1day,30mg POx1day,20mg POx1day,10 mgPOx1day,then stop.  21 tablet  0   No current facility-administered medications for this visit.   Facility-Administered Medications Ordered in Other Visits  Medication Dose Route Frequency Provider Last Rate Last Dose  . bendamustine (TREANDA) 115 mg in sodium chloride 0.9 % 500 mL chemo infusion  70 mg/m2 (Treatment Plan Actual) Intravenous Once Victorino December, MD      . heparin lock  flush 100 unit/mL  500 Units Intracatheter Once PRN Victorino December, MD      . riTUXimab (RITUXAN) 600 mg in sodium chloride 0.9 % 250 mL chemo infusion  375 mg/m2 (Treatment Plan Actual) Intravenous Once Victorino December, MD      . sodium chloride 0.9 % injection 10 mL  10 mL Intracatheter PRN Victorino December, MD        SURGICAL HISTORY:  Past Surgical History  Procedure Laterality Date  . Mastectomy    . Thyroidectomy  1965  . Appendectomy    . Tonsillectomy      REVIEW OF SYSTEMS:  General: fatigue (-), night sweats (-), fever (-), pain (-) Lymph:  palpable nodes (-) HEENT: vision changes (-), mucositis (-), gum bleeding (-), epistaxis (-) Cardiovascular: chest pain (-), palpitations (-) Pulmonary: shortness of breath (-), dyspnea on exertion (-), cough (-), hemoptysis (-) GI:  Early satiety (-), melena (-), dysphagia (-), nausea/vomiting (-), diarrhea (-) GU: dysuria (-), hematuria (-), incontinence (-) Musculoskeletal: joint swelling (-), joint pain (-), back pain (-) Neuro: weakness (-), numbness (-), headache (-), confusion (-) Skin: Rash (+), lesions (-), dryness (-) Psych: depression (-), suicidal/homicidal ideation (-), feeling of hopelessness (-)   PHYSICAL EXAMINATION:  BP 130/74  Pulse 70  Temp(Src) 98.2 F (36.8 C) (Oral)  Resp 20  Ht 5\' 2"  (1.575 m)  Wt 132 lb 8 oz (60.102 kg)  BMI 24.23 kg/m2 General: Patient is a well appearing female in no acute distress HEENT: PERRLA, sclerae anicteric no conjunctival pallor, MMM  Neck: supple, no palpable adenopathy Lungs: clear to auscultation bilaterally, no wheezes, rhonchi, or rales Cardiovascular: regular rate rhythm, S1, S2, no murmurs, rubs or gallops Abdomen: Soft, mild epigastric tenderness, non-distended, normoactive bowel sounds, no HSM Extremities: warm and well perfused, no clubbing, cyanosis, or edema Skin: bilateral upper extremities with erythema on inner forearm until just above the antecubital fossa.  There is no warmth or tenderness.   Neuro: Non-focal ECOG PERFORMANCE STATUS: 1 - Symptomatic but completely ambulatory  LABORATORY DATA: Lab Results  Component Value Date   WBC 4.7 12/30/2012   HGB 12.3 12/30/2012   HCT 38.4 12/30/2012   MCV 88.9 12/30/2012   PLT 175 12/30/2012      Chemistry      Component Value Date/Time   NA 141 12/30/2012 0914   NA 136 07/06/2012 1122   NA 142 06/01/2012 1008   K 3.7 12/30/2012 0914   K 3.8 07/06/2012 1122   K 3.8 06/01/2012 1008   CL 108* 12/30/2012 0914   CL 102 07/06/2012 1122   CL 103 06/01/2012 1008   CO2 26  12/30/2012 0914   CO2 20 07/06/2012 1122   CO2 25 06/01/2012 1008   BUN 9.0 12/30/2012 0914   BUN 8 07/06/2012 1122   BUN 9 06/01/2012 1008   CREATININE 0.7 12/30/2012 0914   CREATININE 0.60 07/06/2012 1122   CREATININE 0.9 06/01/2012 1008      Component Value Date/Time   CALCIUM 9.0 12/30/2012 0914   CALCIUM 9.1 07/06/2012 1122   CALCIUM 9.8 06/01/2012 1008   ALKPHOS 73 12/30/2012 0914   ALKPHOS 69 02/10/2012 1035   ALKPHOS 78 08/14/2010 1251   AST 17 12/30/2012 0914   AST 20 02/10/2012 1035   AST 19 08/14/2010 1251   ALT 12 12/30/2012 0914   ALT 19 02/10/2012 1035   BILITOT 0.22 12/30/2012 0914   BILITOT 0.4 02/10/2012 1035   BILITOT 0.40 08/14/2010 1251  RADIOGRAPHIC STUDIES: NUCLEAR MEDICINE PET CT SKULL BASE TO THIGH  Technique: Technique: 18.8 mCi F-18 FDG was injected  intravenously. CT data was obtained and used for attenuation  correction and anatomic localization only. (This was not acquired  as a diagnostic CT examination.) Additional exam technical data  entered on technologist worksheet.  Comparison: PET of 02/07/2011. CTs of 06/01/2012.  Findings: Neck: Hypermetabolism within the left side of the  mandible, with suspicion of concurrent periapical focal lucency on  image 24.  Bilateral cervical hypermetabolic lymph nodes. Hypermetabolism  which is primarily felt to be nodal surrounds the right jugular  vein and extends into the thoracic inlet. This measures a S.U.V.  max of 9.9 on image 57  Chest: Hypermetabolic right axillary node. This measures 1.7 cm  and a S.U.V. max of 6.1 on image 82. On the prior PET, this node  measured 1.7 cm and a S.U.V. max of 3.1.  Small prevascular nodes which are hypermetabolic, including on  image 72.  Abdomen/Pelvis: Right adrenal hypermetabolism which is without CT  correlate. Hypermetabolic left external iliac adenopathy. This  measures 1.3 cm and a S.U.V. max of 5.0 on image 181. On the prior  PET, this node measured 1.6 cm and on the  order of a S.U.V. max of  2.6.  [place]: Multifocal marrow hypermetabolism consistent with osseous  involvement. Index lesion in the right humeral head measures a  S.U.V. max of 14.4 on image 53 and is new.  A hypermetabolic lytic lesion within the left iliac wing is new or  progressive and measures a S.U.V. max of 8.1 on image 155.  CT images performed for attenuation correction demonstrate  increased number and size of lymph nodes within the neck. Index  right jugulodigastric node measures 9 mm short axis on image 34  versus 6 mm on the prior PET. Chest, abdomen, and pelvic findings  deferred to recent diagnostic CTs. Trace right-sided pleural fluid  is new. Left upper lobe radiation fibrosis. Punctate left renal  calculi. Cholelithiasis. Fibroid uterus. L4 osseous metastasis  has possible epidural component on image 144 transverse.  IMPRESSION:  1. Increasing hypermetabolism associated with adenopathy within  the neck, chest, and pelvis, as detailed above. Findings are most  likely indicative of progression of lymphoma.  2. Progressive osseous metastasis, as detailed above. An L4 lesion  has possible epidural component. Consider pre and post contrast  lumbar spine MRI.  3. Hypermetabolism at the right lower neck and thoracic inlet  surrounds the right internal jugular vein. This appears somewhat  hyperattenuating. Cannot exclude thrombus. Consider right upper  extremity venous ultrasound with attention to this area.  4. Likely dental inflammatory left mandibular hypermetabolism.  Consider physical exam correlation.   ASSESSMENT: 78 year old female with  #1Patient with history of low-grade non-Hodgkin lymphoma she initially received CVP Rituxan and then subsequently on maintenance Rituxan, now on R Bendamustine.  She had a PET/CT on 1/10 which demonstrated response to her therapy. She will receive a total of 6 cycles R Bendamustine and the we will proceed with maintenance  Rituxan.  #2 metastatic breast carcinoma originally diagnosed in 2009 originally on letrozole 2.5 mg daily, now on Arimidex.  #3 L4 bone metastasis which is painful. Patient is now status post palliative radiation therapy.  Also receiving Xgeva q28 days.    #4 Rash  PLAN:  #1 Ms. Courtright is doing well today. Her labs are stable. She will proceed with chemotherapy today.  She will have her scans next week,  and we will see her back on 3/6 for labs and an appointment.  After that, we will start Rituxan maintenance weekly x 4 weeks, then q 3 months.    #2 She is taking her Arimidex and tolerating it pretty well.    #3 I prescribed a Prednisone taper for her rash. She will also apply hydrocortisone cream to it and call us if it worsens or if she develops fevers, or any other concerns.    All questions were answered. The patient knows to call the clinic with any problems, questions or concerns. We can certainly see the patient much sooner if necessary.  I spent 25 minutes counseling the patient face to face. The total time spent in the appointment was 30 minutes.  This case was reviewed with Dr. Welton Flakes.    Cherie Ouch Lyn Hollingshead, NP Medical Oncology [facility] Phone: [phone] 12/30/2012, 12:34 PM

## 2012-12-30 NOTE — Progress Notes (Signed)
Confirmed with patient that her chemo regimen has never caused nausea and she does not think she has any antiemetic at home.  Spoke with Augustin Schooling, NP-doubts patient would pick up med if called in since she has no nausea. Instructed patient to call clinic phone # for any nausea, even if after hours. Tolerated Rituxan without adverse event.

## 2012-12-30 NOTE — Patient Instructions (Addendum)
Doing well.  Proceed with chemotherapy.  We will see you back next week to review your scans.    Do not forget, nothing to eat or drink the night before your CT scan.    Use Hydrocortisone cream on the rash on your arms.  If it worsens after your Prednisone, please call us.  We will see you next week.

## 2012-12-31 ENCOUNTER — Telehealth: Payer: Self-pay | Admitting: Oncology

## 2012-12-31 ENCOUNTER — Ambulatory Visit (HOSPITAL_BASED_OUTPATIENT_CLINIC_OR_DEPARTMENT_OTHER): Payer: Medicare Other

## 2012-12-31 VITALS — BP 130/68 | HR 53 | Temp 97.0°F | Resp 17

## 2012-12-31 DIAGNOSIS — C8589 Other specified types of non-Hodgkin lymphoma, extranodal and solid organ sites: Secondary | ICD-10-CM

## 2012-12-31 DIAGNOSIS — Z5111 Encounter for antineoplastic chemotherapy: Secondary | ICD-10-CM

## 2012-12-31 DIAGNOSIS — C8590 Non-Hodgkin lymphoma, unspecified, unspecified site: Secondary | ICD-10-CM

## 2012-12-31 MED ORDER — HEPARIN SOD (PORK) LOCK FLUSH 100 UNIT/ML IV SOLN
500.0000 [IU] | Freq: Once | INTRAVENOUS | Status: AC | PRN
  Administered 2012-12-31: 500 [IU]
  Filled 2012-12-31: qty 5

## 2012-12-31 MED ORDER — ONDANSETRON 8 MG/50ML IVPB (CHCC)
8.0000 mg | Freq: Once | INTRAVENOUS | Status: AC
  Administered 2012-12-31: 8 mg via INTRAVENOUS

## 2012-12-31 MED ORDER — SODIUM CHLORIDE 0.9 % IV SOLN
Freq: Once | INTRAVENOUS | Status: AC
  Administered 2012-12-31: 11:00:00 via INTRAVENOUS

## 2012-12-31 MED ORDER — SODIUM CHLORIDE 0.9 % IV SOLN
70.0000 mg/m2 | Freq: Once | INTRAVENOUS | Status: AC
  Administered 2012-12-31: 115 mg via INTRAVENOUS
  Filled 2012-12-31: qty 23

## 2012-12-31 MED ORDER — SODIUM CHLORIDE 0.9 % IJ SOLN
10.0000 mL | INTRAMUSCULAR | Status: DC | PRN
  Administered 2012-12-31: 10 mL
  Filled 2012-12-31: qty 10

## 2012-12-31 MED ORDER — DEXAMETHASONE SODIUM PHOSPHATE 10 MG/ML IJ SOLN
10.0000 mg | Freq: Once | INTRAMUSCULAR | Status: AC
  Administered 2012-12-31: 10 mg via INTRAVENOUS

## 2012-12-31 NOTE — Patient Instructions (Addendum)
[facility] Discharge Instructions for Patients Receiving Chemotherapy  Today you received the following chemotherapy agents: treanda.  To help prevent nausea and vomiting after your treatment, we encourage you to take your nausea medication.  Take it as often as prescribed.    If you develop nausea and vomiting that is not controlled by your nausea medication, call the clinic. If it is after clinic hours your family physician or the after hours number for the clinic or go to the Emergency Department.   BELOW ARE SYMPTOMS THAT SHOULD BE REPORTED IMMEDIATELY:  *FEVER GREATER THAN 100.5 F  *CHILLS WITH OR WITHOUT FEVER  NAUSEA AND VOMITING THAT IS NOT CONTROLLED WITH YOUR NAUSEA MEDICATION  *UNUSUAL SHORTNESS OF BREATH  *UNUSUAL BRUISING OR BLEEDING  TENDERNESS IN MOUTH AND THROAT WITH OR WITHOUT PRESENCE OF ULCERS  *URINARY PROBLEMS  *BOWEL PROBLEMS  UNUSUAL RASH Items with * indicate a potential emergency and should be followed up as soon as possible.  One of the nurses will contact you 24 hours after your treatment. Please let the nurse know about any problems that you may have experienced. Feel free to call the clinic you have any questions or concerns. The clinic phone number is [phone].   I have been informed and understand all the instructions given to me. I know to contact the clinic, my physician, or go to the Emergency Department if any problems should occur. I do not have any questions at this time, but understand that I may call the clinic during office hours   should I have any questions or need assistance in obtaining follow up care.    __________________________________________  _____________  __________ Signature of Patient or Authorized Representative            Date                   Time    __________________________________________ Nurse's Signature

## 2012-12-31 NOTE — Telephone Encounter (Signed)
Added appts for 3/13, 3/20, 2/38 and 4/3. lmonvm informing pt and asked that pt get new schedule when she comes in 3/6. Also confirmed 3/1 inj appt.

## 2013-01-01 ENCOUNTER — Ambulatory Visit (HOSPITAL_BASED_OUTPATIENT_CLINIC_OR_DEPARTMENT_OTHER): Payer: Medicare Other

## 2013-01-01 VITALS — BP 157/86 | HR 64 | Temp 97.4°F | Resp 18

## 2013-01-01 DIAGNOSIS — C8590 Non-Hodgkin lymphoma, unspecified, unspecified site: Secondary | ICD-10-CM

## 2013-01-01 DIAGNOSIS — C8589 Other specified types of non-Hodgkin lymphoma, extranodal and solid organ sites: Secondary | ICD-10-CM

## 2013-01-01 MED ORDER — PEGFILGRASTIM INJECTION 6 MG/0.6ML
6.0000 mg | Freq: Once | SUBCUTANEOUS | Status: AC
  Administered 2013-01-01: 6 mg via SUBCUTANEOUS

## 2013-01-01 NOTE — Patient Instructions (Addendum)

## 2013-01-03 ENCOUNTER — Ambulatory Visit (HOSPITAL_COMMUNITY)
Admission: RE | Admit: 2013-01-03 | Discharge: 2013-01-03 | Disposition: A | Discharge status: Home / Self Care | Payer: Medicare Other | Source: Ambulatory Visit | Attending: Adult Health | Admitting: Adult Health

## 2013-01-03 ENCOUNTER — Telehealth: Payer: Self-pay | Admitting: *Deleted

## 2013-01-03 ENCOUNTER — Encounter (HOSPITAL_COMMUNITY): Payer: Self-pay

## 2013-01-03 DIAGNOSIS — X58XXXA Exposure to other specified factors, initial encounter: Secondary | ICD-10-CM | POA: Insufficient documentation

## 2013-01-03 DIAGNOSIS — Z79899 Other long term (current) drug therapy: Secondary | ICD-10-CM | POA: Insufficient documentation

## 2013-01-03 DIAGNOSIS — Q762 Congenital spondylolisthesis: Secondary | ICD-10-CM | POA: Insufficient documentation

## 2013-01-03 DIAGNOSIS — J479 Bronchiectasis, uncomplicated: Secondary | ICD-10-CM | POA: Insufficient documentation

## 2013-01-03 DIAGNOSIS — Z923 Personal history of irradiation: Secondary | ICD-10-CM | POA: Insufficient documentation

## 2013-01-03 DIAGNOSIS — Z9010 Acquired absence of unspecified breast and nipple: Secondary | ICD-10-CM | POA: Insufficient documentation

## 2013-01-03 DIAGNOSIS — N281 Cyst of kidney, acquired: Secondary | ICD-10-CM | POA: Insufficient documentation

## 2013-01-03 DIAGNOSIS — K5730 Diverticulosis of large intestine without perforation or abscess without bleeding: Secondary | ICD-10-CM | POA: Insufficient documentation

## 2013-01-03 DIAGNOSIS — C8589 Other specified types of non-Hodgkin lymphoma, extranodal and solid organ sites: Secondary | ICD-10-CM | POA: Insufficient documentation

## 2013-01-03 DIAGNOSIS — K8020 Calculus of gallbladder without cholecystitis without obstruction: Secondary | ICD-10-CM | POA: Insufficient documentation

## 2013-01-03 DIAGNOSIS — S32009A Unspecified fracture of unspecified lumbar vertebra, initial encounter for closed fracture: Secondary | ICD-10-CM | POA: Insufficient documentation

## 2013-01-03 DIAGNOSIS — Z853 Personal history of malignant neoplasm of breast: Secondary | ICD-10-CM | POA: Insufficient documentation

## 2013-01-03 MED ORDER — IOHEXOL 300 MG/ML  SOLN
100.0000 mL | Freq: Once | INTRAMUSCULAR | Status: AC | PRN
  Administered 2013-01-03: 100 mL via INTRAVENOUS

## 2013-01-03 NOTE — Telephone Encounter (Signed)
THIS REPORT WAS CALLED AND FAXED TO TRIAGE. GIVEN TO LINDSEY ALEXANDER,NP.

## 2013-01-06 ENCOUNTER — Other Ambulatory Visit (HOSPITAL_BASED_OUTPATIENT_CLINIC_OR_DEPARTMENT_OTHER): Payer: Medicare Other | Admitting: Lab

## 2013-01-06 ENCOUNTER — Encounter: Payer: Self-pay | Admitting: Adult Health

## 2013-01-06 ENCOUNTER — Ambulatory Visit: Payer: Medicare Other

## 2013-01-06 ENCOUNTER — Ambulatory Visit (HOSPITAL_BASED_OUTPATIENT_CLINIC_OR_DEPARTMENT_OTHER): Payer: Medicare Other | Admitting: Adult Health

## 2013-01-06 ENCOUNTER — Ambulatory Visit (HOSPITAL_BASED_OUTPATIENT_CLINIC_OR_DEPARTMENT_OTHER): Payer: Medicare Other

## 2013-01-06 VITALS — BP 150/78 | HR 67 | Temp 98.0°F | Resp 20 | Ht 62.0 in | Wt 131.5 lb

## 2013-01-06 VITALS — BP 157/59 | HR 63

## 2013-01-06 DIAGNOSIS — S32059B Unspecified fracture of fifth lumbar vertebra, initial encounter for open fracture: Secondary | ICD-10-CM

## 2013-01-06 DIAGNOSIS — C50919 Malignant neoplasm of unspecified site of unspecified female breast: Secondary | ICD-10-CM

## 2013-01-06 DIAGNOSIS — C7951 Secondary malignant neoplasm of bone: Secondary | ICD-10-CM

## 2013-01-06 DIAGNOSIS — C7952 Secondary malignant neoplasm of bone marrow: Secondary | ICD-10-CM

## 2013-01-06 DIAGNOSIS — C8589 Other specified types of non-Hodgkin lymphoma, extranodal and solid organ sites: Secondary | ICD-10-CM

## 2013-01-06 DIAGNOSIS — R197 Diarrhea, unspecified: Secondary | ICD-10-CM

## 2013-01-06 DIAGNOSIS — E876 Hypokalemia: Secondary | ICD-10-CM

## 2013-01-06 LAB — COMPREHENSIVE METABOLIC PANEL (CC13)
Alkaline Phosphatase: 166 U/L — ABNORMAL HIGH (ref 40–150)
BUN: 10 mg/dL (ref 7.0–26.0)
CO2: 23 mEq/L (ref 22–29)
Creatinine: 0.8 mg/dL (ref 0.6–1.1)
Glucose: 165 mg/dl — ABNORMAL HIGH (ref 70–99)
Total Bilirubin: 0.35 mg/dL (ref 0.20–1.20)
Total Protein: 6.5 g/dL (ref 6.4–8.3)

## 2013-01-06 LAB — CBC WITH DIFFERENTIAL/PLATELET
Basophils Absolute: 0.2 10*3/uL — ABNORMAL HIGH (ref 0.0–0.1)
Eosinophils Absolute: 0.1 10*3/uL (ref 0.0–0.5)
HCT: 37.4 % (ref 34.8–46.6)
LYMPH%: 0.9 % — ABNORMAL LOW (ref 14.0–49.7)
MCV: 87.3 fL (ref 79.5–101.0)
MONO#: 3 10*3/uL — ABNORMAL HIGH (ref 0.1–0.9)
MONO%: 7.8 % (ref 0.0–14.0)
NEUT#: 34.7 10*3/uL — ABNORMAL HIGH (ref 1.5–6.5)
NEUT%: 90.7 % — ABNORMAL HIGH (ref 38.4–76.8)
Platelets: 153 10*3/uL (ref 145–400)
RBC: 4.28 10*6/uL (ref 3.70–5.45)
WBC: 38.2 10*3/uL — ABNORMAL HIGH (ref 3.9–10.3)

## 2013-01-06 MED ORDER — SODIUM CHLORIDE 0.9 % IJ SOLN
10.0000 mL | INTRAMUSCULAR | Status: DC | PRN
  Administered 2013-01-06: 10 mL via INTRAVENOUS
  Filled 2013-01-06: qty 10

## 2013-01-06 MED ORDER — DENOSUMAB 120 MG/1.7ML ~~LOC~~ SOLN
120.0000 mg | Freq: Once | SUBCUTANEOUS | Status: AC
  Administered 2013-01-06: 120 mg via SUBCUTANEOUS
  Filled 2013-01-06: qty 1.7

## 2013-01-06 MED ORDER — POTASSIUM CHLORIDE CRYS ER 20 MEQ PO TBCR
20.0000 meq | EXTENDED_RELEASE_TABLET | Freq: Once | ORAL | Status: AC
  Administered 2013-01-06: 20 meq via ORAL
  Filled 2013-01-06: qty 1

## 2013-01-06 MED ORDER — SODIUM CHLORIDE 0.9 % IV SOLN
Freq: Once | INTRAVENOUS | Status: AC
  Administered 2013-01-06: 10:00:00 via INTRAVENOUS
  Filled 2013-01-06: qty 1000

## 2013-01-06 MED ORDER — HEPARIN SOD (PORK) LOCK FLUSH 100 UNIT/ML IV SOLN
500.0000 [IU] | Freq: Once | INTRAVENOUS | Status: AC
  Administered 2013-01-06: 500 [IU] via INTRAVENOUS
  Filled 2013-01-06: qty 5

## 2013-01-06 NOTE — Patient Instructions (Addendum)
Doing well.  We have added you on to the treatment room for potassium today.  We will see you back for maintenance Rituxan on 3/28.  Please call us if you have any questions or concerns.   Rituximab injection What is this medicine? RITUXIMAB (ri TUX i mab) is a monoclonal antibody. This medicine changes the way the body's immune system works. It is used commonly to treat non-Hodgkin's lymphoma and other conditions. In cancer cells, this drug targets a specific protein within cancer cells and stops the cancer cells from growing. It is also used to treat rhuematoid arthritis (RA). In RA, this medicine slow the inflammatory process and help reduce joint pain and swelling. This medicine is often used with other cancer or arthritis medications. This medicine may be used for other purposes; ask your health care provider or pharmacist if you have questions. What should I tell my health care provider before I take this medicine? They need to know if you have any of these conditions: -blood disorders -heart disease -history of hepatitis B -infection (especially a virus infection such as chickenpox, cold sores, or herpes) -irregular heartbeat -kidney disease -lung or breathing disease, like asthma -lupus -an unusual or allergic reaction to rituximab, mouse proteins, other medicines, foods, dyes, or preservatives -pregnant or trying to get pregnant -breast-feeding How should I use this medicine? This medicine is for infusion into a vein. It is administered in a hospital or clinic by a specially trained health care professional. A special MedGuide will be given to you by the pharmacist with each prescription and refill. Be sure to read this information carefully each time. Talk to your pediatrician regarding the use of this medicine in children. This medicine is not approved for use in children. Overdosage: If you think you have taken too much of this medicine contact a poison control center or emergency  room at once. NOTE: This medicine is only for you. Do not share this medicine with others. What if I miss a dose? It is important not to miss a dose. Call your doctor or health care professional if you are unable to keep an appointment. What may interact with this medicine? -cisplatin -medicines for blood pressure -some other medicines for arthritis -vaccines This list may not describe all possible interactions. Give your health care provider a list of all the medicines, herbs, non-prescription drugs, or dietary supplements you use. Also tell them if you smoke, drink alcohol, or use illegal drugs. Some items may interact with your medicine. What should I watch for while using this medicine? Report any side effects that you notice during your treatment right away, such as changes in your breathing, fever, chills, dizziness or lightheadedness. These effects are more common with the first dose. Visit your prescriber or health care professional for checks on your progress. You will need to have regular blood work. Report any other side effects. The side effects of this medicine can continue after you finish your treatment. Continue your course of treatment even though you feel ill unless your doctor tells you to stop. Call your doctor or health care professional for advice if you get a fever, chills or sore throat, or other symptoms of a cold or flu. Do not treat yourself. This drug decreases your body's ability to fight infections. Try to avoid being around people who are sick. This medicine may increase your risk to bruise or bleed. Call your doctor or health care professional if you notice any unusual bleeding. Be careful brushing and  flossing your teeth or using a toothpick because you may get an infection or bleed more easily. If you have any dental work done, tell your dentist you are receiving this medicine. Avoid taking products that contain aspirin, acetaminophen, ibuprofen, naproxen, or  ketoprofen unless instructed by your doctor. These medicines may hide a fever. Do not become pregnant while taking this medicine. Women should inform their doctor if they wish to become pregnant or think they might be pregnant. There is a potential for serious side effects to an unborn child. Talk to your health care professional or pharmacist for more information. Do not breast-feed an infant while taking this medicine. What side effects may I notice from receiving this medicine? Side effects that you should report to your doctor or health care professional as soon as possible: -allergic reactions like skin rash, itching or hives, swelling of the face, lips, or tongue -low blood counts - this medicine may decrease the number of white blood cells, red blood cells and platelets. You may be at increased risk for infections and bleeding. -signs of infection - fever or chills, cough, sore throat, pain or difficulty passing urine -signs of decreased platelets or bleeding - bruising, pinpoint red spots on the skin, black, tarry stools, blood in the urine -signs of decreased red blood cells - unusually weak or tired, fainting spells, lightheadedness -breathing problems -confused, not responsive -chest pain -fast, irregular heartbeat -feeling faint or lightheaded, falls -mouth sores -redness, blistering, peeling or loosening of the skin, including inside the mouth -stomach pain -swelling of the ankles, feet, or hands -trouble passing urine or change in the amount of urine Side effects that usually do not require medical attention (report to your doctor or other health care professional if they continue or are bothersome): -anxiety -headache -loss of appetite -muscle aches -nausea -night sweats This list may not describe all possible side effects. Call your doctor for medical advice about side effects. You may report side effects to FDA at 1-800-FDA-1088. Where should I keep my medicine? This drug  is given in a hospital or clinic and will not be stored at home. NOTE: This sheet is a summary. It may not cover all possible information. If you have questions about this medicine, talk to your doctor, pharmacist, or health care provider.  2012, Elsevier/Gold Standard. (06/19/2008 2:04:59 PM)Hypokalemia Hypokalemia means a low potassium level in the blood.Potassium is an electrolyte that helps regulate the amount of fluid in the body. It also stimulates muscle contraction and maintains a stable acid-base balance.Most of the body's potassium is inside of cells, and only a very small amount is in the blood. Because the amount in the blood is so small, minor changes can have big effects. PREPARATION FOR TEST Testing for potassium requires taking a blood sample taken by needle from a vein in the arm. The skin is cleaned thoroughly before the sample is drawn. There is no other special preparation needed. NORMAL VALUES Potassium levels below 3.5 mEq/L are abnormally low. Levels above 5.1 mEq/L are abnormally high. Ranges for normal findings may vary among different laboratories and hospitals. You should always check with your doctor after having lab work or other tests done to discuss the meaning of your test results and whether your values are considered within normal limits. MEANING OF TEST  Your caregiver will go over the test results with you and discuss the importance and meaning of your results, as well as treatment options and the need for additional tests, if necessary.  A potassium level is frequently part of a routine medical exam. It is usually included as part of a whole "panel" of tests for several blood salts (such as Sodium and Chloride). It may be done as part of follow-up when a low potassium level was found in the past or other blood salts are suspected of being out of balance. A low potassium level might be suspected if you have one or more of the following:  Symptoms of  weakness.  Abnormal heart rhythms.  High blood pressure and are taking medication to control this, especially water pills (diuretics).  Kidney disease that can affect your potassium level .  Diabetes requiring the use of insulin. The potassium may fall after taking insulin, especially if the diabetes had been out of control for a while.  A condition requiring the use of cortisone-type medication or certain types of antibiotics.  Vomiting and/or diarrhea for more than a day or two.  A stomach or intestinal condition that may not permit appropriate absorption of potassium.  Fainting episodes.  Mental confusion. OBTAINING TEST RESULTS It is your responsibility to obtain your test results. Ask the lab or department performing the test when and how you will get your results.  Please contact your caregiver directly if you have not received the results within one week. At that time, ask if there is anything different or new you should be doing in relation to the results. TREATMENT Hypokalemia can be treated with potassium supplements taken by mouth and/or adjustments in your current medications. A diet high in potassium is also helpful. Foods with high potassium content are:  Peas, lentils, lima beans, nuts, and dried fruit.  Whole grain and bran cereals and breads.  Fresh fruit, vegetables (bananas, cantaloupe, grapefruit, oranges, tomatoes, honeydew melons, potatoes).  Orange and tomato juices.  Meats. If potassium supplement has been prescribed for you today or your medications have been adjusted, see your personal caregiver in time02 for a re-check. SEEK MEDICAL CARE IF:  There is a feeling of worsening weakness.  You experience repeated chest palpitations.  You are diabetic and having difficulty keeping your blood sugars in the normal range.  You are experiencing vomiting and/or diarrhea.  You are having difficulty with any of your regular medications. SEEK IMMEDIATE  MEDICAL CARE IF:  You experience chest pain, shortness of breath, or episodes of dizziness.  You have been having vomiting or diarrhea for more than 2 days.  You have a fainting episode. MAKE SURE YOU:   Understand these instructions.  Will watch your condition.  Will get help right away if you are not doing well or get worse. Document Released: 10/20/2005 Document Revised: 01/12/2012 Document Reviewed: 09/30/2008 [facility] Patient Information 2013 [place], [place].

## 2013-01-06 NOTE — Progress Notes (Signed)
OFFICE PROGRESS NOTE  CC  [name], MD [address] [facility] [address] [place] [place] [address]  DIAGNOSIS: 78 year old female with:  #1 left breast carcinoma originally presenting as a fungating mass. Patient underwent neoadjuvant chemotherapy followed by mastectomy. She is currently on Arimidex 1mg  daily.   #2 low-grade non-Hodgkin lymphoma status post CVP and Rituxan now receiving R-Bendamustine.   PRIOR THERAPY:  #1 the patient was diagnosed with metastatic breast carcinoma beginning October 2009. She returned her 1 chemotherapy followed by mastectomy radiation. She was on letrozole 2.5 mg daily, but has changed to Arimidex 1mg  daily.  Also receives Xgeva every 28 days.    #2 Was diagnosed with low-grade non-Hodgkin lymphoma  12. She received 6 cycles of CVP and Rituxan, followed by maintenance Rituxan, who recurred and is now on R-Bendamustine.    #3 Pt underwent 4 cycles of R-Bendamustin PET/CT on 1/10 showed CR to therapy, will complete 2 cycles of treatment and the proceed to maintenance Rituxan.  CURRENT THERAPY:  Rivka Barbara     INTERVAL HISTORY: Helen [name] 78 y.o. female returns for evaluation today.  She previously had a rash which is improving on the steroid taper.  She had the follow up CT scans which again demonstrated no lymphadenopathy.  She did develop diarrhea secondary to the contrast that lasted for 24 hours.  The CT scan did show L5 fracture.  She denies numbness, weakness, bowel or bladder incontinence.  She does not have a PCP.  Otherwise, a 10 point ROS is neg.   MEDICAL HISTORY: Past Medical History  Diagnosis Date  . Arthritis   . Blood transfusion 2009  . Breast CA 08/2008    (LT) breast ca dx 10/09/Chemo  . Lymphoma 09/09/2011    NHL  . Leukemia, acute, in remission 2012    Pt. not sure of type  . Hypertension   . Seizures 1980's    from medication reaction/Pt.  [name] Hx of radiation therapy 09/11/08 -10/31/08    left breast  . Metastasis  to bone 07/03/2012    MRI L spine  . History of radiation therapy eot 07/30/12    lumbar spine L4 /l shoulder    ALLERGIES:  is allergic to codeine; penicillins; sulfonamide derivatives; tape; and tramadol hcl.  MEDICATIONS:  Current Outpatient Prescriptions  Medication Sig Dispense Refill  . anastrozole (ARIMIDEX) 1 MG tablet Take 1 tablet (1 mg total) by mouth daily.  30 tablet  6  . oxycodone (OXY-IR) 5 MG capsule Take 1 capsule (5 mg total) by mouth every 4 (four) hours as needed.  90 capsule  0  . predniSONE (STERAPRED UNI-PAK) 10 MG tablet Take 1 tablet (10 mg total) by mouth daily. 60mg POx1day,50mg POx1 day,40mg POx1day,30mg POx1day,20mg POx1day,10 mgPOx1day,then stop.  21 tablet  0  . calcium-vitamin D (OSCAL WITH D) 500-200 MG-UNIT per tablet Take 2 tablets by mouth daily.       Current Facility-Administered Medications  Medication Dose Route Frequency Provider Last Rate Last Dose  . potassium chloride SA (K-DUR,KLOR-CON) CR tablet 20 mEq  20 mEq Oral Once Augustin Schooling, NP      . sodium chloride 0.9 % 1,000 mL with potassium chloride 40 mEq infusion   Intravenous Once Augustin Schooling, NP        SURGICAL HISTORY:  Past Surgical History  Procedure Laterality Date  . Mastectomy    . Thyroidectomy  1965  . Appendectomy    . Tonsillectomy      REVIEW OF SYSTEMS:  General: fatigue (-), night sweats (-), fever (-), pain (-) Lymph: palpable nodes (-) HEENT: vision changes (-), mucositis (-), gum bleeding (-), epistaxis (-) Cardiovascular: chest pain (-), palpitations (-) Pulmonary: shortness of breath (-), dyspnea on exertion (-), cough (-), hemoptysis (-) GI:  Early satiety (-), melena (-), dysphagia (-), nausea/vomiting (-), diarrhea (-) GU: dysuria (-), hematuria (-), incontinence (-) Musculoskeletal: joint swelling (-), joint pain (-), back pain (-) Neuro: weakness (-), numbness (-), headache (-), confusion (-) Skin: Rash (+), lesions (-), dryness (-) Psych: depression  (-), suicidal/homicidal ideation (-), feeling of hopelessness (-)   PHYSICAL EXAMINATION:  BP 150/78  Pulse 67  Temp(Src) 98 F (36.7 C) (Oral)  Resp 20  Ht 5\' 2"  (1.575 m)  Wt 131 lb 8 oz (59.648 kg)  BMI 24.05 kg/m2 General: Patient is a well appearing female in no acute distress HEENT: PERRLA, sclerae anicteric no conjunctival pallor, MMM  Neck: supple, no palpable adenopathy Lungs: clear to auscultation bilaterally, no wheezes, rhonchi, or rales Cardiovascular: regular rate rhythm, S1, S2, no murmurs, rubs or gallops Abdomen: Soft, mild epigastric tenderness, non-distended, normoactive bowel sounds, no HSM Extremities: warm and well perfused, no clubbing, cyanosis, or edema Skin: bilateral upper extremities with erythema that is improving on inner forearm until just above the antecubital fossa.  There is no warmth or tenderness.   Neuro: Non-focal ECOG PERFORMANCE STATUS: 1 - Symptomatic but completely ambulatory  LABORATORY DATA: Lab Results  Component Value Date   WBC 38.2* 01/06/2013   HGB 12.6 01/06/2013   HCT 37.4 01/06/2013   MCV 87.3 01/06/2013   PLT 153 01/06/2013      Chemistry      Component Value Date/Time   NA 142 01/06/2013 0830   NA 136 07/06/2012 1122   NA 142 06/01/2012 1008   K 3.0 Repeated and Verified* 01/06/2013 0830   K 3.8 07/06/2012 1122   K 3.8 06/01/2012 1008   CL 105 01/06/2013 0830   CL 102 07/06/2012 1122   CL 103 06/01/2012 1008   CO2 23 01/06/2013 0830   CO2 20 07/06/2012 1122   CO2 25 06/01/2012 1008   BUN 10.0 01/06/2013 0830   BUN 8 07/06/2012 1122   BUN 9 06/01/2012 1008   CREATININE 0.8 01/06/2013 0830   CREATININE 0.60 07/06/2012 1122   CREATININE 0.9 06/01/2012 1008      Component Value Date/Time   CALCIUM 9.4 01/06/2013 0830   CALCIUM 9.1 07/06/2012 1122   CALCIUM 9.8 06/01/2012 1008   ALKPHOS 166* 01/06/2013 0830   ALKPHOS 69 02/10/2012 1035   ALKPHOS 78 08/14/2010 1251   AST 27 01/06/2013 0830   AST 20 02/10/2012 1035   AST 19 08/14/2010 1251   ALT 12 01/06/2013  0830   ALT 19 02/10/2012 1035   BILITOT 0.35 01/06/2013 0830   BILITOT 0.4 02/10/2012 1035   BILITOT 0.40 08/14/2010 1251       RADIOGRAPHIC STUDIES: NUCLEAR MEDICINE PET CT SKULL BASE TO THIGH  Technique: Technique: 18.8 mCi F-18 FDG was injected  intravenously. CT data was obtained and used for attenuation  correction and anatomic localization only. (This was not acquired  as a diagnostic CT examination.) Additional exam technical data  entered on technologist worksheet.  Comparison: PET of 02/07/2011. CTs of 06/01/2012.  Findings: Neck: Hypermetabolism within the left side of the  mandible, with suspicion of concurrent periapical focal lucency on  image 24.  Bilateral cervical hypermetabolic lymph nodes. Hypermetabolism  which is primarily felt to  be nodal surrounds the right jugular  vein and extends into the thoracic inlet. This measures a S.U.V.  max of 9.9 on image 57  Chest: Hypermetabolic right axillary node. This measures 1.7 cm  and a S.U.V. max of 6.1 on image 82. On the prior PET, this node  measured 1.7 cm and a S.U.V. max of 3.1.  Small prevascular nodes which are hypermetabolic, including on  image 72.  Abdomen/Pelvis: Right adrenal hypermetabolism which is without CT  correlate. Hypermetabolic left external iliac adenopathy. This  measures 1.3 cm and a S.U.V. max of 5.0 on image 181. On the prior  PET, this node measured 1.6 cm and on the order of a S.U.V. max of  2.6.  [place]: Multifocal marrow hypermetabolism consistent with osseous  involvement. Index lesion in the right humeral head measures a  S.U.V. max of 14.4 on image 53 and is new.  A hypermetabolic lytic lesion within the left iliac wing is new or  progressive and measures a S.U.V. max of 8.1 on image 155.  CT images performed for attenuation correction demonstrate  increased number and size of lymph nodes within the neck. Index  right jugulodigastric node measures 9 mm short axis on image 34  versus 6  mm on the prior PET. Chest, abdomen, and pelvic findings  deferred to recent diagnostic CTs. Trace right-sided pleural fluid  is new. Left upper lobe radiation fibrosis. Punctate left renal  calculi. Cholelithiasis. Fibroid uterus. L4 osseous metastasis  has possible epidural component on image 144 transverse.  IMPRESSION:  1. Increasing hypermetabolism associated with adenopathy within  the neck, chest, and pelvis, as detailed above. Findings are most  likely indicative of progression of lymphoma.  2. Progressive osseous metastasis, as detailed above. An L4 lesion  has possible epidural component. Consider pre and post contrast  lumbar spine MRI.  3. Hypermetabolism at the right lower neck and thoracic inlet  surrounds the right internal jugular vein. This appears somewhat  hyperattenuating. Cannot exclude thrombus. Consider right upper  extremity venous ultrasound with attention to this area.  4. Likely dental inflammatory left mandibular hypermetabolism.  Consider physical exam correlation.   ASSESSMENT: 78 year old female with  #1Patient with history of low-grade non-Hodgkin lymphoma she initially received CVP Rituxan and then subsequently on maintenance Rituxan, now on R Bendamustine.  She had a PET/CT on 1/10 which demonstrated response to her therapy. She will receive a total of 6 cycles R Bendamustine and the we will proceed with maintenance Rituxan.  #2 metastatic breast carcinoma originally diagnosed in 2009 originally on letrozole 2.5 mg daily, now on Arimidex.  #3 L4 bone metastasis which is painful. Patient is now status post palliative radiation therapy.  Also receiving Xgeva q28 days.    #4 Rash  PLAN:  #1 Ms. Swingler is doing well today. Her labs are stable. She will receive IV and PO potassium in the treatment room today since she cannot pick up a prescription today to start taking.  Due to transportation she cannot RTC until 3/28 for maintenance Rituxan.  We have  re-arranged her appts and sent those to scheduling.    #2 She is taking her Arimidex and tolerating it pretty well.    All questions were answered. The patient knows to call the clinic with any problems, questions or concerns. We can certainly see the patient much sooner if necessary.  I spent 25 minutes counseling the patient face to face. The total time spent in the appointment was 30 minutes.  This case was reviewed with Dr. Welton Flakes.    Cherie Ouch Lyn Hollingshead, NP Medical Oncology [facility] Phone: [phone] 01/06/2013, 9:38 AM

## 2013-01-07 ENCOUNTER — Telehealth: Payer: Self-pay | Admitting: Oncology

## 2013-01-07 ENCOUNTER — Ambulatory Visit: Payer: Medicare Other

## 2013-01-07 ENCOUNTER — Telehealth: Payer: Self-pay | Admitting: *Deleted

## 2013-01-07 NOTE — Telephone Encounter (Signed)
S/w the pt and she is aware of her cancelled march appts for the 13th and the 20th and she will still come in for her march 28th appts

## 2013-01-07 NOTE — Telephone Encounter (Signed)
Per staff message and POF I have scheduled appt for 4/17. Appt for 4/10 not scheduled, due to the late MD visit. Scheduler message back to advise new appt. JMW

## 2013-01-10 ENCOUNTER — Telehealth: Payer: Self-pay | Admitting: Oncology

## 2013-01-10 NOTE — Telephone Encounter (Signed)
PT HAS AN APPT WITH THE [facility] ON 01/13/2013@10 :30AM

## 2013-01-10 NOTE — Telephone Encounter (Signed)
LMONVM ADVISING THE PT OF HER APPT WITH THE ORTHOPEDIC MD AT THE [facility]

## 2013-01-13 ENCOUNTER — Ambulatory Visit: Payer: Medicare Other | Admitting: Adult Health

## 2013-01-13 ENCOUNTER — Ambulatory Visit: Payer: Medicare Other

## 2013-01-13 ENCOUNTER — Other Ambulatory Visit: Payer: Medicare Other | Admitting: Lab

## 2013-01-20 ENCOUNTER — Ambulatory Visit: Payer: Medicare Other | Admitting: Adult Health

## 2013-01-20 ENCOUNTER — Ambulatory Visit: Payer: Medicare Other

## 2013-01-20 ENCOUNTER — Other Ambulatory Visit: Payer: Medicare Other | Admitting: Lab

## 2013-01-24 ENCOUNTER — Other Ambulatory Visit (HOSPITAL_COMMUNITY): Payer: Medicare Other

## 2013-01-28 ENCOUNTER — Other Ambulatory Visit (HOSPITAL_BASED_OUTPATIENT_CLINIC_OR_DEPARTMENT_OTHER): Payer: Medicare Other | Admitting: Lab

## 2013-01-28 ENCOUNTER — Ambulatory Visit (HOSPITAL_BASED_OUTPATIENT_CLINIC_OR_DEPARTMENT_OTHER): Payer: Medicare Other

## 2013-01-28 ENCOUNTER — Encounter: Payer: Self-pay | Admitting: Oncology

## 2013-01-28 ENCOUNTER — Ambulatory Visit (HOSPITAL_BASED_OUTPATIENT_CLINIC_OR_DEPARTMENT_OTHER): Payer: Medicare Other | Admitting: Oncology

## 2013-01-28 ENCOUNTER — Telehealth: Payer: Self-pay | Admitting: Oncology

## 2013-01-28 VITALS — BP 125/70 | HR 71 | Temp 97.6°F | Resp 20 | Ht 62.0 in | Wt 130.9 lb

## 2013-01-28 VITALS — BP 125/56 | HR 63 | Temp 97.5°F | Resp 18 | Ht 62.0 in | Wt 130.0 lb

## 2013-01-28 DIAGNOSIS — C8590 Non-Hodgkin lymphoma, unspecified, unspecified site: Secondary | ICD-10-CM

## 2013-01-28 DIAGNOSIS — C7951 Secondary malignant neoplasm of bone: Secondary | ICD-10-CM

## 2013-01-28 DIAGNOSIS — Z5112 Encounter for antineoplastic immunotherapy: Secondary | ICD-10-CM

## 2013-01-28 DIAGNOSIS — R21 Rash and other nonspecific skin eruption: Secondary | ICD-10-CM

## 2013-01-28 DIAGNOSIS — C8589 Other specified types of non-Hodgkin lymphoma, extranodal and solid organ sites: Secondary | ICD-10-CM

## 2013-01-28 DIAGNOSIS — Z853 Personal history of malignant neoplasm of breast: Secondary | ICD-10-CM

## 2013-01-28 DIAGNOSIS — C50919 Malignant neoplasm of unspecified site of unspecified female breast: Secondary | ICD-10-CM

## 2013-01-28 DIAGNOSIS — C7952 Secondary malignant neoplasm of bone marrow: Secondary | ICD-10-CM

## 2013-01-28 LAB — CBC WITH DIFFERENTIAL/PLATELET
Basophils Absolute: 0 10*3/uL (ref 0.0–0.1)
EOS%: 9.1 % — ABNORMAL HIGH (ref 0.0–7.0)
HGB: 12 g/dL (ref 11.6–15.9)
LYMPH%: 11.7 % — ABNORMAL LOW (ref 14.0–49.7)
MCH: 28.8 pg (ref 25.1–34.0)
MCV: 88.9 fL (ref 79.5–101.0)
MONO%: 13.5 % (ref 0.0–14.0)
NEUT%: 64.8 % (ref 38.4–76.8)
RDW: 14.5 % (ref 11.2–14.5)

## 2013-01-28 LAB — COMPREHENSIVE METABOLIC PANEL (CC13)
Alkaline Phosphatase: 77 U/L (ref 40–150)
BUN: 7.7 mg/dL (ref 7.0–26.0)
Creatinine: 0.7 mg/dL (ref 0.6–1.1)
Glucose: 90 mg/dl (ref 70–99)
Sodium: 141 mEq/L (ref 136–145)
Total Bilirubin: 0.24 mg/dL (ref 0.20–1.20)

## 2013-01-28 MED ORDER — OXYCODONE HCL 5 MG PO CAPS: 5.0000 mg | ORAL_CAPSULE | ORAL | Status: DC | PRN

## 2013-01-28 MED ORDER — SODIUM CHLORIDE 0.9 % IV SOLN
Freq: Once | INTRAVENOUS | Status: AC
  Administered 2013-01-28: 11:00:00 via INTRAVENOUS

## 2013-01-28 MED ORDER — HEPARIN SOD (PORK) LOCK FLUSH 100 UNIT/ML IV SOLN
500.0000 [IU] | Freq: Once | INTRAVENOUS | Status: AC | PRN
  Administered 2013-01-28: 500 [IU]
  Filled 2013-01-28: qty 5

## 2013-01-28 MED ORDER — SODIUM CHLORIDE 0.9 % IV SOLN
375.0000 mg/m2 | Freq: Once | INTRAVENOUS | Status: AC
  Administered 2013-01-28: 600 mg via INTRAVENOUS
  Filled 2013-01-28: qty 60

## 2013-01-28 MED ORDER — ANASTROZOLE 1 MG PO TABS: 1.0000 mg | ORAL_TABLET | Freq: Every day | ORAL | Status: DC

## 2013-01-28 MED ORDER — ACETAMINOPHEN 325 MG PO TABS
650.0000 mg | ORAL_TABLET | Freq: Once | ORAL | Status: AC
  Administered 2013-01-28: 650 mg via ORAL

## 2013-01-28 MED ORDER — DIPHENHYDRAMINE HCL 25 MG PO CAPS
50.0000 mg | ORAL_CAPSULE | Freq: Once | ORAL | Status: AC
  Administered 2013-01-28: 50 mg via ORAL

## 2013-01-28 MED ORDER — SODIUM CHLORIDE 0.9 % IJ SOLN
10.0000 mL | INTRAMUSCULAR | Status: DC | PRN
  Administered 2013-01-28: 10 mL
  Filled 2013-01-28: qty 10

## 2013-01-28 MED ORDER — SODIUM CHLORIDE 0.9 % IV SOLN: 375.0000 mg/m2 | Freq: Once | INTRAVENOUS | Status: DC

## 2013-01-28 NOTE — Telephone Encounter (Signed)
gv pt appt schedule for April thru September.

## 2013-01-28 NOTE — Patient Instructions (Addendum)
Proceed with rituxan every 21 days   xgeva every 4 weeks  We will see you back on 4/18

## 2013-01-28 NOTE — Patient Instructions (Signed)
[facility] Discharge Instructions for Patients Receiving Chemotherapy  Today you received the following chemotherapy agents Rituxan   To help prevent nausea and vomiting after your treatment, we encourage you to take your nausea medication as prescribed If you develop nausea and vomiting that is not controlled by your nausea medication, call the clinic. If it is after clinic hours your family physician or the after hours number for the clinic or go to the Emergency Department.   BELOW ARE SYMPTOMS THAT SHOULD BE REPORTED IMMEDIATELY:  *FEVER GREATER THAN 100.5 F  *CHILLS WITH OR WITHOUT FEVER  NAUSEA AND VOMITING THAT IS NOT CONTROLLED WITH YOUR NAUSEA MEDICATION  *UNUSUAL SHORTNESS OF BREATH  *UNUSUAL BRUISING OR BLEEDING  TENDERNESS IN MOUTH AND THROAT WITH OR WITHOUT PRESENCE OF ULCERS  *URINARY PROBLEMS  *BOWEL PROBLEMS  UNUSUAL RASH Items with * indicate a potential emergency and should be followed up as soon as possible.  One of the nurses will contact you 24 hours after your treatment. Please let the nurse know about any problems that you may have experienced. Feel free to call the clinic you have any questions or concerns. The clinic phone number is [phone].   I have been informed and understand all the instructions given to me. I know to contact the clinic, my physician, or go to the Emergency Department if any problems should occur. I do not have any questions at this time, but understand that I may call the clinic during office hours   should I have any questions or need assistance in obtaining follow up care.    __________________________________________  _____________  __________ Signature of Patient or Authorized Representative            Date                   Time    __________________________________________ Nurse's Signature    

## 2013-01-28 NOTE — Progress Notes (Signed)
OFFICE PROGRESS NOTE  CC  [name], MD [address] [place], [address] [place] [address]  DIAGNOSIS: 78 year old female with:  #1 left breast carcinoma originally presenting as a fungating mass. Patient underwent neoadjuvant chemotherapy followed by mastectomy. She is currently on Arimidex 1mg  daily.   #2 low-grade non-Hodgkin lymphoma status post CVP and Rituxan now receiving R-Bendamustine.   PRIOR THERAPY:  #1 the patient was diagnosed with metastatic breast carcinoma beginning October 2009. She returned her 1 chemotherapy followed by mastectomy radiation. She was on letrozole 2.5 mg daily, but has changed to Arimidex 1mg  daily.  Also receives Xgeva every 28 days.    #2 Was diagnosed with low-grade non-Hodgkin lymphoma  12. She received 6 cycles of CVP and Rituxan, followed by maintenance Rituxan, who recurred and is now on R-Bendamustine.    #3 Pt underwent 4 cycles of R-Bendamustin PET/CT on 1/10 showed CR to therapy, will complete 2 cycles of treatment and the proceed to maintenance Rituxan.  CURRENT THERAPY:  Xgeva/rituxan every 21 days  INTERVAL HISTORY: Caffie Sotto 78 y.o. female returns for evaluation today.  She previously had a rash which is improving on the steroid taper.  She had the follow up CT scans which again demonstrated no lymphadenopathy.  She did develop diarrhea secondary to the contrast that lasted for 24 hours.  The CT scan did show L5 fracture.  She denies numbness, weakness, bowel or bladder incontinence.  She does not have a PCP.  Otherwise, a 10 point ROS is neg.   MEDICAL HISTORY: Past Medical History  Diagnosis Date  . Arthritis   . Blood transfusion 2009  . Breast CA 08/2008    (LT) breast ca dx 10/09/Chemo  . Lymphoma 09/09/2011    NHL  . Leukemia, acute, in remission 2012    Pt. not sure of type  . Hypertension   . Seizures 1980's    from medication reaction/Pt.  [name] Hx of radiation therapy 09/11/08 -10/31/08    left  breast  . Metastasis to bone 07/03/2012    MRI L spine  . History of radiation therapy eot 07/30/12    lumbar spine L4 /l shoulder    ALLERGIES:  is allergic to codeine; penicillins; sulfonamide derivatives; tape; and tramadol hcl.  MEDICATIONS:  Current Outpatient Prescriptions  Medication Sig Dispense Refill  . anastrozole (ARIMIDEX) 1 MG tablet Take 1 tablet (1 mg total) by mouth daily.  30 tablet  12  . calcium-vitamin D (OSCAL WITH D) 500-200 MG-UNIT per tablet Take 2 tablets by mouth daily.      [name] oxycodone (OXY-IR) 5 MG capsule Take 1 capsule (5 mg total) by mouth every 4 (four) hours as needed.  90 capsule  0   No current facility-administered medications for this visit.    SURGICAL HISTORY:  Past Surgical History  Procedure Laterality Date  . Mastectomy    . Thyroidectomy  1965  . Appendectomy    . Tonsillectomy      REVIEW OF SYSTEMS:  General: fatigue (-), night sweats (-), fever (-), pain (-) Lymph: palpable nodes (-) HEENT: vision changes (-), mucositis (-), gum bleeding (-), epistaxis (-) Cardiovascular: chest pain (-), palpitations (-) Pulmonary: shortness of breath (-), dyspnea on exertion (-), cough (-), hemoptysis (-) GI:  Early satiety (-), melena (-), dysphagia (-), nausea/vomiting (-), diarrhea (-) GU: dysuria (-), hematuria (-), incontinence (-) Musculoskeletal: joint swelling (-), joint pain (-), back pain (-) Neuro: weakness (-), numbness (-),  headache (-), confusion (-) Skin: Rash (+), lesions (-), dryness (-) Psych: depression (-), suicidal/homicidal ideation (-), feeling of hopelessness (-)   PHYSICAL EXAMINATION:  BP 125/70  Pulse 71  Temp(Src) 97.6 F (36.4 C) (Oral)  Resp 20  Ht 5\' 2"  (1.575 m)  Wt 130 lb 14.4 oz (59.376 kg)  BMI 23.94 kg/m2 General: Patient is a well appearing female in no acute distress HEENT: PERRLA, sclerae anicteric no conjunctival pallor, MMM  Neck: supple, no palpable adenopathy Lungs: clear to auscultation  bilaterally, no wheezes, rhonchi, or rales Cardiovascular: regular rate rhythm, S1, S2, no murmurs, rubs or gallops Abdomen: Soft, mild epigastric tenderness, non-distended, normoactive bowel sounds, no HSM Extremities: warm and well perfused, no clubbing, cyanosis, or edema Skin: bilateral upper extremities with erythema that is improving on inner forearm until just above the antecubital fossa.  There is no warmth or tenderness.   Neuro: Non-focal ECOG PERFORMANCE STATUS: 1 - Symptomatic but completely ambulatory  LABORATORY DATA: Lab Results  Component Value Date   WBC 4.5 01/28/2013   HGB 12.0 01/28/2013   HCT 37.0 01/28/2013   MCV 88.9 01/28/2013   PLT 191 01/28/2013      Chemistry      Component Value Date/Time   NA 142 01/06/2013 0830   NA 136 07/06/2012 1122   NA 142 06/01/2012 1008   K 3.0 Repeated and Verified* 01/06/2013 0830   K 3.8 07/06/2012 1122   K 3.8 06/01/2012 1008   CL 105 01/06/2013 0830   CL 102 07/06/2012 1122   CL 103 06/01/2012 1008   CO2 23 01/06/2013 0830   CO2 20 07/06/2012 1122   CO2 25 06/01/2012 1008   BUN 10.0 01/06/2013 0830   BUN 8 07/06/2012 1122   BUN 9 06/01/2012 1008   CREATININE 0.8 01/06/2013 0830   CREATININE 0.60 07/06/2012 1122   CREATININE 0.9 06/01/2012 1008      Component Value Date/Time   CALCIUM 9.4 01/06/2013 0830   CALCIUM 9.1 07/06/2012 1122   CALCIUM 9.8 06/01/2012 1008   ALKPHOS 166* 01/06/2013 0830   ALKPHOS 69 02/10/2012 1035   ALKPHOS 78 08/14/2010 1251   AST 27 01/06/2013 0830   AST 20 02/10/2012 1035   AST 19 08/14/2010 1251   ALT 12 01/06/2013 0830   ALT 19 02/10/2012 1035   BILITOT 0.35 01/06/2013 0830   BILITOT 0.4 02/10/2012 1035   BILITOT 0.40 08/14/2010 1251       RADIOGRAPHIC STUDIES: NUCLEAR MEDICINE PET CT SKULL BASE TO THIGH  Technique: Technique: 18.8 mCi F-18 FDG was injected  intravenously. CT data was obtained and used for attenuation  correction and anatomic localization only. (This was not acquired  as a diagnostic CT examination.)  Additional exam technical data  entered on technologist worksheet.  Comparison: PET of 02/07/2011. CTs of 06/01/2012.  Findings: Neck: Hypermetabolism within the left side of the  mandible, with suspicion of concurrent periapical focal lucency on  image 24.  Bilateral cervical hypermetabolic lymph nodes. Hypermetabolism  which is primarily felt to be nodal surrounds the right jugular  vein and extends into the thoracic inlet. This measures a S.U.V.  max of 9.9 on image 57  Chest: Hypermetabolic right axillary node. This measures 1.7 cm  and a S.U.V. max of 6.1 on image 82. On the prior PET, this node  measured 1.7 cm and a S.U.V. max of 3.1.  Small prevascular nodes which are hypermetabolic, including on  image 72.  Abdomen/Pelvis: Right adrenal hypermetabolism which  is without CT  correlate. Hypermetabolic left external iliac adenopathy. This  measures 1.3 cm and a S.U.V. max of 5.0 on image 181. On the prior  PET, this node measured 1.6 cm and on the order of a S.U.V. max of  2.6.  Skelton: Multifocal marrow hypermetabolism consistent with osseous  involvement. Index lesion in the right humeral head measures a  S.U.V. max of 14.4 on image 53 and is new.  A hypermetabolic lytic lesion within the left iliac wing is new or  progressive and measures a S.U.V. max of 8.1 on image 155.  CT images performed for attenuation correction demonstrate  increased number and size of lymph nodes within the neck. Index  right jugulodigastric node measures 9 mm short axis on image 34  versus 6 mm on the prior PET. Chest, abdomen, and pelvic findings  deferred to recent diagnostic CTs. Trace right-sided pleural fluid  is new. Left upper lobe radiation fibrosis. Punctate left renal  calculi. Cholelithiasis. Fibroid uterus. L4 osseous metastasis  has possible epidural component on image 144 transverse.  IMPRESSION:  1. Increasing hypermetabolism associated with adenopathy within  the neck, chest, and  pelvis, as detailed above. Findings are most  likely indicative of progression of lymphoma.  2. Progressive osseous metastasis, as detailed above. An L4 lesion  has possible epidural component. Consider pre and post contrast  lumbar spine MRI.  3. Hypermetabolism at the right lower neck and thoracic inlet  surrounds the right internal jugular vein. This appears somewhat  hyperattenuating. Cannot exclude thrombus. Consider right upper  extremity venous ultrasound with attention to this area.  4. Likely dental inflammatory left mandibular hypermetabolism.  Consider physical exam correlation.   ASSESSMENT: 79 year old female with  #1Patient with history of low-grade non-Hodgkin lymphoma she initially received CVP Rituxan and then subsequently on maintenance Rituxan, now on R Bendamustine.  She had a PET/CT on 1/10 which demonstrated response to her therapy. She will receive a total of 6 cycles R Bendamustine and the we will proceed with maintenance Rituxan.  #2 metastatic breast carcinoma originally diagnosed in 2009 originally on letrozole 2.5 mg daily, now on Arimidex.  #3 L4 bone metastasis which is painful. Patient is now status post palliative radiation therapy.  Also receiving Xgeva q28 days.    #4 Rash  PLAN:  #1 patient will proceed with Rituxan every 21 days starting today.  #2 she'll continue Arimidex 1 mg daily.  #3 she will return in 3 weeks for followup and her next dose of Rituxan.  All questions were answered. The patient knows to call the clinic with any problems, questions or concerns. We can certainly see the patient much sooner if necessary.  I spent 25 minutes counseling the patient face to face. The total time spent in the appointment was 30 minutes.

## 2013-02-03 ENCOUNTER — Ambulatory Visit: Payer: Medicare Other

## 2013-02-03 ENCOUNTER — Other Ambulatory Visit: Payer: Medicare Other | Admitting: Lab

## 2013-02-03 ENCOUNTER — Ambulatory Visit: Payer: Medicare Other | Admitting: Adult Health

## 2013-02-04 ENCOUNTER — Ambulatory Visit: Payer: Medicare Other

## 2013-02-10 ENCOUNTER — Ambulatory Visit: Payer: Medicare Other

## 2013-02-10 ENCOUNTER — Other Ambulatory Visit: Payer: Medicare Other | Admitting: Lab

## 2013-02-10 ENCOUNTER — Ambulatory Visit: Payer: Medicare Other | Admitting: Adult Health

## 2013-02-17 ENCOUNTER — Ambulatory Visit: Payer: Medicare Other

## 2013-02-17 ENCOUNTER — Ambulatory Visit: Payer: Medicare Other | Admitting: Adult Health

## 2013-02-17 ENCOUNTER — Other Ambulatory Visit: Payer: Medicare Other | Admitting: Lab

## 2013-02-18 ENCOUNTER — Encounter: Payer: Self-pay | Admitting: Adult Health

## 2013-02-18 ENCOUNTER — Ambulatory Visit (HOSPITAL_BASED_OUTPATIENT_CLINIC_OR_DEPARTMENT_OTHER): Payer: Medicare Other

## 2013-02-18 ENCOUNTER — Other Ambulatory Visit (HOSPITAL_BASED_OUTPATIENT_CLINIC_OR_DEPARTMENT_OTHER): Payer: Medicare Other | Admitting: Lab

## 2013-02-18 ENCOUNTER — Ambulatory Visit (HOSPITAL_BASED_OUTPATIENT_CLINIC_OR_DEPARTMENT_OTHER): Payer: Medicare Other | Admitting: Adult Health

## 2013-02-18 VITALS — BP 139/61 | HR 63 | Temp 97.5°F | Resp 20 | Ht 62.0 in | Wt 134.2 lb

## 2013-02-18 VITALS — BP 138/50 | HR 60 | Temp 97.8°F | Resp 12

## 2013-02-18 DIAGNOSIS — C8590 Non-Hodgkin lymphoma, unspecified, unspecified site: Secondary | ICD-10-CM

## 2013-02-18 DIAGNOSIS — C8589 Other specified types of non-Hodgkin lymphoma, extranodal and solid organ sites: Secondary | ICD-10-CM

## 2013-02-18 DIAGNOSIS — C7952 Secondary malignant neoplasm of bone marrow: Secondary | ICD-10-CM

## 2013-02-18 DIAGNOSIS — G893 Neoplasm related pain (acute) (chronic): Secondary | ICD-10-CM

## 2013-02-18 DIAGNOSIS — Z5112 Encounter for antineoplastic immunotherapy: Secondary | ICD-10-CM

## 2013-02-18 DIAGNOSIS — C7951 Secondary malignant neoplasm of bone: Secondary | ICD-10-CM

## 2013-02-18 DIAGNOSIS — C50919 Malignant neoplasm of unspecified site of unspecified female breast: Secondary | ICD-10-CM

## 2013-02-18 LAB — COMPREHENSIVE METABOLIC PANEL (CC13)
ALT: 10 U/L (ref 0–55)
AST: 17 U/L (ref 5–34)
Chloride: 107 mEq/L (ref 98–107)
Creatinine: 0.8 mg/dL (ref 0.6–1.1)
Sodium: 139 mEq/L (ref 136–145)
Total Bilirubin: 0.2 mg/dL (ref 0.20–1.20)
Total Protein: 6.6 g/dL (ref 6.4–8.3)

## 2013-02-18 LAB — CBC WITH DIFFERENTIAL/PLATELET
Basophils Absolute: 0 10*3/uL (ref 0.0–0.1)
EOS%: 7.7 % — ABNORMAL HIGH (ref 0.0–7.0)
Eosinophils Absolute: 0.4 10*3/uL (ref 0.0–0.5)
HCT: 35.4 % (ref 34.8–46.6)
HGB: 11.4 g/dL — ABNORMAL LOW (ref 11.6–15.9)
MCH: 29.1 pg (ref 25.1–34.0)
MCV: 90.3 fL (ref 79.5–101.0)
MONO%: 15.9 % — ABNORMAL HIGH (ref 0.0–14.0)
NEUT%: 63.9 % (ref 38.4–76.8)
lymph#: 0.6 10*3/uL — ABNORMAL LOW (ref 0.9–3.3)

## 2013-02-18 MED ORDER — ALBUTEROL SULFATE (2.5 MG/3ML) 0.083% IN NEBU
2.5000 mg | INHALATION_SOLUTION | Freq: Once | RESPIRATORY_TRACT | Status: DC | PRN
  Filled 2013-02-18: qty 3

## 2013-02-18 MED ORDER — DIPHENHYDRAMINE HCL 50 MG/ML IJ SOLN: 25.0000 mg | Freq: Once | INTRAMUSCULAR | Status: DC | PRN

## 2013-02-18 MED ORDER — DENOSUMAB 120 MG/1.7ML ~~LOC~~ SOLN
120.0000 mg | Freq: Once | SUBCUTANEOUS | Status: AC
  Administered 2013-02-18: 120 mg via SUBCUTANEOUS
  Filled 2013-02-18: qty 1.7

## 2013-02-18 MED ORDER — HEPARIN SOD (PORK) LOCK FLUSH 100 UNIT/ML IV SOLN
500.0000 [IU] | Freq: Once | INTRAVENOUS | Status: AC | PRN
  Administered 2013-02-18: 500 [IU]
  Filled 2013-02-18: qty 5

## 2013-02-18 MED ORDER — SODIUM CHLORIDE 0.9 % IV SOLN: Freq: Once | INTRAVENOUS | Status: DC | PRN

## 2013-02-18 MED ORDER — SODIUM CHLORIDE 0.9 % IJ SOLN
10.0000 mL | INTRAMUSCULAR | Status: DC | PRN
  Administered 2013-02-18: 10 mL
  Filled 2013-02-18: qty 10

## 2013-02-18 MED ORDER — EPINEPHRINE HCL 0.1 MG/ML IJ SOLN
0.2500 mg | Freq: Once | INTRAMUSCULAR | Status: DC | PRN
  Filled 2013-02-18: qty 10

## 2013-02-18 MED ORDER — DIPHENHYDRAMINE HCL 50 MG/ML IJ SOLN: 50.0000 mg | Freq: Once | INTRAMUSCULAR | Status: DC | PRN

## 2013-02-18 MED ORDER — DIPHENHYDRAMINE HCL 25 MG PO CAPS
50.0000 mg | ORAL_CAPSULE | Freq: Once | ORAL | Status: AC
  Administered 2013-02-18: 50 mg via ORAL

## 2013-02-18 MED ORDER — SODIUM CHLORIDE 0.9 % IV SOLN
Freq: Once | INTRAVENOUS | Status: AC
  Administered 2013-02-18: 12:00:00 via INTRAVENOUS

## 2013-02-18 MED ORDER — ACETAMINOPHEN 325 MG PO TABS
650.0000 mg | ORAL_TABLET | Freq: Once | ORAL | Status: AC
  Administered 2013-02-18: 650 mg via ORAL

## 2013-02-18 MED ORDER — METHYLPREDNISOLONE SODIUM SUCC 125 MG IJ SOLR: 125.0000 mg | Freq: Once | INTRAMUSCULAR | Status: DC | PRN

## 2013-02-18 MED ORDER — SODIUM CHLORIDE 0.9 % IV SOLN
375.0000 mg/m2 | Freq: Once | INTRAVENOUS | Status: AC
  Administered 2013-02-18: 600 mg via INTRAVENOUS
  Filled 2013-02-18: qty 60

## 2013-02-18 NOTE — Patient Instructions (Addendum)
[facility] Discharge Instructions for Patients Receiving Chemotherapy  Today you received the following chemotherapy agents:  Rituxan To help prevent nausea and vomiting after your treatment, we encourage you to take your nausea medication   If you develop nausea and vomiting that is not controlled by your nausea medication, call the clinic. If it is after clinic hours your family physician or the after hours number for the clinic or go to the Emergency Department.   BELOW ARE SYMPTOMS THAT SHOULD BE REPORTED IMMEDIATELY:  *FEVER GREATER THAN 100.5 F  *CHILLS WITH OR WITHOUT FEVER  NAUSEA AND VOMITING THAT IS NOT CONTROLLED WITH YOUR NAUSEA MEDICATION  *UNUSUAL SHORTNESS OF BREATH  *UNUSUAL BRUISING OR BLEEDING  TENDERNESS IN MOUTH AND THROAT WITH OR WITHOUT PRESENCE OF ULCERS  *URINARY PROBLEMS  *BOWEL PROBLEMS  UNUSUAL RASH Items with * indicate a potential emergency and should be followed up as soon as possible.   Please let the nurse know about any problems that you may have experienced. Feel free to call the clinic you have any questions or concerns. The clinic phone number is [phone].   I have been informed and understand all the instructions given to me. I know to contact the clinic, my physician, or go to the Emergency Department if any problems should occur. I do not have any questions at this time, but understand that I may call the clinic during office hours   should I have any questions or need assistance in obtaining follow up care.    __________________________________________  _____________  __________ Signature of Patient or Authorized Representative            Date                   Time    __________________________________________ Nurse's Signature

## 2013-02-18 NOTE — Patient Instructions (Addendum)
Doing well, proceed with Rituximab.  Please call us if you have any questions or concerns.

## 2013-02-18 NOTE — Progress Notes (Signed)
OFFICE PROGRESS NOTE  CC  Helen Linger, MD [address] [facility] [address] [place], [address] [place] [address]  DIAGNOSIS: 78 year old female with:  #1 left breast carcinoma originally presenting as a fungating mass. Patient underwent neoadjuvant chemotherapy followed by mastectomy. She is currently on Arimidex 1mg  daily.   #2 low-grade non-Hodgkin lymphoma status post CVP and Rituxan now receiving R-Bendamustine.   PRIOR THERAPY:  #1 the patient was diagnosed with metastatic breast carcinoma beginning October 2009. She returned her 1 chemotherapy followed by mastectomy radiation. She was on letrozole 2.5 mg daily, but has changed to Arimidex 1mg  daily.  Also receives Xgeva every 28 days.    #2 Was diagnosed with low-grade non-Hodgkin lymphoma  12. She received 6 cycles of CVP and Rituxan, followed by maintenance Rituxan, who recurred and is now on R-Bendamustine.    #3 Pt underwent 4 cycles of R-Bendamustin PET/CT on 1/10 showed CR to therapy, will complete 2 cycles of treatment and the proceed to maintenance Rituxan.  #4 Maintenance Rituximab started on 01/28/13.  CURRENT THERAPY:  Arimidex daily, Xgeva, and Maintenance Rituximab  INTERVAL HISTORY: Helen [name] 78 y.o. female returns for evaluation today.  She is doing well today.  She was started on maintenance Rituximab on 3/28 and toelrated it well.  She is here for her next dose today.  She denies fevers, chills, nausea, vomiting, shortness of breath or any other concerns.    MEDICAL HISTORY: Past Medical History  Diagnosis Date  . Arthritis   . Blood transfusion 2009  . Breast CA 08/2008    (LT) breast ca dx 10/09/Chemo  . Lymphoma 09/09/2011    NHL  . Leukemia, acute, in remission 2012    Pt. not sure of type  . Hypertension   . Seizures 1980's    from medication reaction/Pt.  [name] Hx of radiation therapy 09/11/08 -10/31/08    left breast  . Metastasis to bone 07/03/2012    MRI L spine  . History of radiation therapy  eot 07/30/12    lumbar spine L4 /l shoulder    ALLERGIES:  is allergic to codeine; penicillins; sulfonamide derivatives; tape; and tramadol hcl.  MEDICATIONS:  Current Outpatient Prescriptions  Medication Sig Dispense Refill  . anastrozole (ARIMIDEX) 1 MG tablet Take 1 tablet (1 mg total) by mouth daily.  30 tablet  12  . calcium-vitamin D (OSCAL WITH D) 500-200 MG-UNIT per tablet Take 2 tablets by mouth daily.      [name] oxycodone (OXY-IR) 5 MG capsule Take 1 capsule (5 mg total) by mouth every 4 (four) hours as needed.  90 capsule  0   No current facility-administered medications for this visit.    SURGICAL HISTORY:  Past Surgical History  Procedure Laterality Date  . Mastectomy    . Thyroidectomy  1965  . Appendectomy    . Tonsillectomy      REVIEW OF SYSTEMS:  General: fatigue (-), night sweats (-), fever (-), pain (-) Lymph: palpable nodes (-) HEENT: vision changes (-), mucositis (-), gum bleeding (-), epistaxis (-) Cardiovascular: chest pain (-), palpitations (-) Pulmonary: shortness of breath (-), dyspnea on exertion (-), cough (-), hemoptysis (-) GI:  Early satiety (-), melena (-), dysphagia (-), nausea/vomiting (-), diarrhea (-) GU: dysuria (-), hematuria (-), incontinence (-) Musculoskeletal: joint swelling (-), joint pain (-), back pain (-) Neuro: weakness (-), numbness (-), headache (-), confusion (-) Skin: Rash (+), lesions (-), dryness (-) Psych: depression (-), suicidal/homicidal ideation (-), feeling of  hopelessness (-)   PHYSICAL EXAMINATION:  BP 139/61  Pulse 63  Temp(Src) 97.5 F (36.4 C) (Oral)  Resp 20  Ht 5\' 2"  (1.575 m)  Wt 134 lb 3.2 oz (60.873 kg)  BMI 24.54 kg/m2 General: Patient is a well appearing female in no acute distress HEENT: PERRLA, sclerae anicteric no conjunctival pallor, MMM  Neck: supple, no palpable adenopathy Lungs: clear to auscultation bilaterally, no wheezes, rhonchi, or rales Cardiovascular: regular rate rhythm, S1, S2, no  murmurs, rubs or gallops Abdomen: Soft, mild epigastric tenderness, non-distended, normoactive bowel sounds, no HSM Extremities: warm and well perfused, no clubbing, cyanosis, or edema Skin: bilateral upper extremities with erythema that is improving on inner forearm until just above the antecubital fossa.  There is no warmth or tenderness.   Neuro: Non-focal ECOG PERFORMANCE STATUS: 1 - Symptomatic but completely ambulatory  LABORATORY DATA: Lab Results  Component Value Date   WBC 5.0 02/18/2013   HGB 11.4* 02/18/2013   HCT 35.4 02/18/2013   MCV 90.3 02/18/2013   PLT 193 02/18/2013      Chemistry      Component Value Date/Time   NA 141 01/28/2013 0900   NA 136 07/06/2012 1122   NA 142 06/01/2012 1008   K 3.7 01/28/2013 0900   K 3.8 07/06/2012 1122   K 3.8 06/01/2012 1008   CL 107 01/28/2013 0900   CL 102 07/06/2012 1122   CL 103 06/01/2012 1008   CO2 24 01/28/2013 0900   CO2 20 07/06/2012 1122   CO2 25 06/01/2012 1008   BUN 7.7 01/28/2013 0900   BUN 8 07/06/2012 1122   BUN 9 06/01/2012 1008   CREATININE 0.7 01/28/2013 0900   CREATININE 0.60 07/06/2012 1122   CREATININE 0.9 06/01/2012 1008      Component Value Date/Time   CALCIUM 9.7 01/28/2013 0900   CALCIUM 9.1 07/06/2012 1122   CALCIUM 9.8 06/01/2012 1008   ALKPHOS 77 01/28/2013 0900   ALKPHOS 69 02/10/2012 1035   ALKPHOS 78 08/14/2010 1251   AST 15 01/28/2013 0900   AST 20 02/10/2012 1035   AST 19 08/14/2010 1251   ALT 11 01/28/2013 0900   ALT 19 02/10/2012 1035   BILITOT 0.24 01/28/2013 0900   BILITOT 0.4 02/10/2012 1035   BILITOT 0.40 08/14/2010 1251       RADIOGRAPHIC STUDIES: NUCLEAR MEDICINE PET CT SKULL BASE TO THIGH  Technique: Technique: 18.8 mCi F-18 FDG was injected  intravenously. CT data was obtained and used for attenuation  correction and anatomic localization only. (This was not acquired  as a diagnostic CT examination.) Additional exam technical data  entered on technologist worksheet.  Comparison: PET of 02/07/2011. CTs of  06/01/2012.  Findings: Neck: Hypermetabolism within the left side of the  mandible, with suspicion of concurrent periapical focal lucency on  image 24.  Bilateral cervical hypermetabolic lymph nodes. Hypermetabolism  which is primarily felt to be nodal surrounds the right jugular  vein and extends into the thoracic inlet. This measures a S.U.V.  max of 9.9 on image 57  Chest: Hypermetabolic right axillary node. This measures 1.7 cm  and a S.U.V. max of 6.1 on image 82. On the prior PET, this node  measured 1.7 cm and a S.U.V. max of 3.1.  Small prevascular nodes which are hypermetabolic, including on  image 72.  Abdomen/Pelvis: Right adrenal hypermetabolism which is without CT  correlate. Hypermetabolic left external iliac adenopathy. This  measures 1.3 cm and a S.U.V. max of 5.0 on  image 181. On the prior  PET, this node measured 1.6 cm and on the order of a S.U.V. max of  2.6.  Skelton: Multifocal marrow hypermetabolism consistent with osseous  involvement. Index lesion in the right humeral head measures a  S.U.V. max of 14.4 on image 53 and is new.  A hypermetabolic lytic lesion within the left iliac wing is new or  progressive and measures a S.U.V. max of 8.1 on image 155.  CT images performed for attenuation correction demonstrate  increased number and size of lymph nodes within the neck. Index  right jugulodigastric node measures 9 mm short axis on image 34  versus 6 mm on the prior PET. Chest, abdomen, and pelvic findings  deferred to recent diagnostic CTs. Trace right-sided pleural fluid  is new. Left upper lobe radiation fibrosis. Punctate left renal  calculi. Cholelithiasis. Fibroid uterus. L4 osseous metastasis  has possible epidural component on image 144 transverse.  IMPRESSION:  1. Increasing hypermetabolism associated with adenopathy within  the neck, chest, and pelvis, as detailed above. Findings are most  likely indicative of progression of lymphoma.  2.  Progressive osseous metastasis, as detailed above. An L4 lesion  has possible epidural component. Consider pre and post contrast  lumbar spine MRI.  3. Hypermetabolism at the right lower neck and thoracic inlet  surrounds the right internal jugular vein. This appears somewhat  hyperattenuating. Cannot exclude thrombus. Consider right upper  extremity venous ultrasound with attention to this area.  4. Likely dental inflammatory left mandibular hypermetabolism.  Consider physical exam correlation.   ASSESSMENT: 78 year old female with  #1Patient with history of low-grade non-Hodgkin lymphoma she initially received CVP Rituxan and then subsequently on maintenance Rituxan, now on R Bendamustine.  She had a PET/CT on 1/10 which demonstrated response to her therapy. She will receive a total of 6 cycles R Bendamustine and the we will proceed with maintenance Rituxan.  #2 metastatic breast carcinoma originally diagnosed in 2009 originally on letrozole 2.5 mg daily, now on Arimidex.  #3 L4 bone metastasis which is painful. Patient is now status post palliative radiation therapy.  Also receiving Xgeva q28 days.     PLAN:  #1 Ms. Ambriz is doing well today. She will proceed with Rituxan today.  She is also due for her Rivka Barbara today.  She has h/o hypokalemia.  We discussed food items rich in potassium that she can eat.    #2 She is taking her Arimidex and tolerating it well.    #3 We will see her back in 3 weeks for her next dose.    All questions were answered. The patient knows to call the clinic with any problems, questions or concerns. We can certainly see the patient much sooner if necessary.  I spent 25 minutes counseling the patient face to face. The total time spent in the appointment was 30 minutes.  Cherie Ouch Lyn Hollingshead, NP Medical Oncology [facility] Phone: [phone]

## 2013-03-04 ENCOUNTER — Ambulatory Visit: Payer: Medicare Other

## 2013-03-08 ENCOUNTER — Other Ambulatory Visit: Payer: Self-pay | Admitting: *Deleted

## 2013-03-08 DIAGNOSIS — C50919 Malignant neoplasm of unspecified site of unspecified female breast: Secondary | ICD-10-CM

## 2013-03-08 MED ORDER — ANASTROZOLE 1 MG PO TABS: 1.0000 mg | ORAL_TABLET | Freq: Every day | ORAL | Status: DC

## 2013-03-11 ENCOUNTER — Other Ambulatory Visit (HOSPITAL_BASED_OUTPATIENT_CLINIC_OR_DEPARTMENT_OTHER): Payer: Medicare Other | Admitting: Lab

## 2013-03-11 ENCOUNTER — Ambulatory Visit (HOSPITAL_BASED_OUTPATIENT_CLINIC_OR_DEPARTMENT_OTHER): Payer: Medicare Other

## 2013-03-11 ENCOUNTER — Ambulatory Visit (HOSPITAL_BASED_OUTPATIENT_CLINIC_OR_DEPARTMENT_OTHER): Payer: Medicare Other | Admitting: Adult Health

## 2013-03-11 ENCOUNTER — Encounter: Payer: Self-pay | Admitting: Oncology

## 2013-03-11 ENCOUNTER — Encounter: Payer: Self-pay | Admitting: Adult Health

## 2013-03-11 VITALS — BP 112/47 | HR 63 | Temp 98.0°F | Resp 16

## 2013-03-11 VITALS — BP 116/71 | HR 73 | Temp 98.6°F | Resp 20 | Ht 62.0 in | Wt 134.5 lb

## 2013-03-11 DIAGNOSIS — C8589 Other specified types of non-Hodgkin lymphoma, extranodal and solid organ sites: Secondary | ICD-10-CM

## 2013-03-11 DIAGNOSIS — C50919 Malignant neoplasm of unspecified site of unspecified female breast: Secondary | ICD-10-CM

## 2013-03-11 DIAGNOSIS — C8590 Non-Hodgkin lymphoma, unspecified, unspecified site: Secondary | ICD-10-CM

## 2013-03-11 DIAGNOSIS — C7952 Secondary malignant neoplasm of bone marrow: Secondary | ICD-10-CM

## 2013-03-11 DIAGNOSIS — G893 Neoplasm related pain (acute) (chronic): Secondary | ICD-10-CM

## 2013-03-11 DIAGNOSIS — C7951 Secondary malignant neoplasm of bone: Secondary | ICD-10-CM

## 2013-03-11 DIAGNOSIS — Z5112 Encounter for antineoplastic immunotherapy: Secondary | ICD-10-CM

## 2013-03-11 LAB — CBC WITH DIFFERENTIAL/PLATELET
Basophils Absolute: 0 10*3/uL (ref 0.0–0.1)
EOS%: 5.9 % (ref 0.0–7.0)
HCT: 35 % (ref 34.8–46.6)
HGB: 11.3 g/dL — ABNORMAL LOW (ref 11.6–15.9)
LYMPH%: 5.9 % — ABNORMAL LOW (ref 14.0–49.7)
MCH: 28.6 pg (ref 25.1–34.0)
MCV: 88.3 fL (ref 79.5–101.0)
MONO%: 19.6 % — ABNORMAL HIGH (ref 0.0–14.0)
NEUT%: 67.5 % (ref 38.4–76.8)

## 2013-03-11 LAB — COMPREHENSIVE METABOLIC PANEL (CC13)
AST: 15 U/L (ref 5–34)
Alkaline Phosphatase: 65 U/L (ref 40–150)
BUN: 13.7 mg/dL (ref 7.0–26.0)
Creatinine: 0.8 mg/dL (ref 0.6–1.1)

## 2013-03-11 MED ORDER — DIPHENHYDRAMINE HCL 25 MG PO CAPS
50.0000 mg | ORAL_CAPSULE | Freq: Once | ORAL | Status: AC
  Administered 2013-03-11: 50 mg via ORAL

## 2013-03-11 MED ORDER — SODIUM CHLORIDE 0.9 % IV SOLN
375.0000 mg/m2 | Freq: Once | INTRAVENOUS | Status: AC
  Administered 2013-03-11: 600 mg via INTRAVENOUS
  Filled 2013-03-11: qty 60

## 2013-03-11 MED ORDER — ACETAMINOPHEN 325 MG PO TABS
650.0000 mg | ORAL_TABLET | Freq: Once | ORAL | Status: AC
  Administered 2013-03-11: 650 mg via ORAL

## 2013-03-11 MED ORDER — HEPARIN SOD (PORK) LOCK FLUSH 100 UNIT/ML IV SOLN
500.0000 [IU] | Freq: Once | INTRAVENOUS | Status: AC | PRN
  Administered 2013-03-11: 500 [IU]
  Filled 2013-03-11: qty 5

## 2013-03-11 MED ORDER — SODIUM CHLORIDE 0.9 % IJ SOLN
10.0000 mL | INTRAMUSCULAR | Status: DC | PRN
  Administered 2013-03-11: 10 mL
  Filled 2013-03-11: qty 10

## 2013-03-11 MED ORDER — SODIUM CHLORIDE 0.9 % IV SOLN
Freq: Once | INTRAVENOUS | Status: AC
  Administered 2013-03-11: 11:00:00 via INTRAVENOUS

## 2013-03-11 NOTE — Patient Instructions (Addendum)
Doing well.  PRoceed with treatment.  Please call us if you have any questions or concerns.

## 2013-03-11 NOTE — Progress Notes (Signed)
OFFICE PROGRESS NOTE  CC  [name], MD [address] [facility] [address] [place], [address] [name] [address]  DIAGNOSIS: 78 year old female with:  #1 left breast carcinoma originally presenting as a fungating mass. Patient underwent neoadjuvant chemotherapy followed by mastectomy. She is currently on Arimidex 1mg  daily.   #2 low-grade non-Hodgkin lymphoma status post CVP and Rituxan now receiving R-Bendamustine.   PRIOR THERAPY:  #1 the patient was diagnosed with metastatic breast carcinoma beginning October 2009. She returned her 1 chemotherapy followed by mastectomy radiation. She was on letrozole 2.5 mg daily, but has changed to Arimidex 1mg  daily.  Also receives Xgeva every 28 days.    #2 Was diagnosed with low-grade non-Hodgkin lymphoma  12. She received 6 cycles of CVP and Rituxan, followed by maintenance Rituxan, who recurred and is now on R-Bendamustine.    #3 Pt underwent 4 cycles of R-Bendamustin PET/CT on 1/10 showed CR to therapy, will complete 2 cycles of treatment and the proceed to maintenance Rituxan.  #4 Maintenance Rituximab every 3 weeks started on 01/28/13.  CURRENT THERAPY:  Arimidex daily, Xgeva, and Maintenance Rituximab  INTERVAL HISTORY: Helen [name] 78 y.o. female returns for evaluation today prior to her maintenance rituximab.  She is doing well today.  She denies fevers, chills, weight loss, swelling ,pain.  She does have mild joint aches in her knees.  She takes her Arimidex every day.  Otherwise, a 10 point ROS is neg.   MEDICAL HISTORY: Past Medical History  Diagnosis Date  . Arthritis   . Blood transfusion 2009  . Breast CA 08/2008    (LT) breast ca dx 10/09/Chemo  . Lymphoma 09/09/2011    NHL  . Leukemia, acute, in remission 2012    Pt. not sure of type  . Hypertension   . Seizures 1980's    from medication reaction/Pt.  [name] Hx of radiation therapy 09/11/08 -10/31/08    left breast  . Metastasis to bone 07/03/2012    MRI L spine  . History  of radiation therapy eot 07/30/12    lumbar spine L4 /l shoulder    ALLERGIES:  is allergic to codeine; penicillins; sulfonamide derivatives; tape; and tramadol hcl.  MEDICATIONS:  Current Outpatient Prescriptions  Medication Sig Dispense Refill  . anastrozole (ARIMIDEX) 1 MG tablet Take 1 tablet (1 mg total) by mouth daily.  30 tablet  0  . calcium-vitamin D (OSCAL WITH D) 500-200 MG-UNIT per tablet Take 2 tablets by mouth daily.      [name] oxycodone (OXY-IR) 5 MG capsule Take 1 capsule (5 mg total) by mouth every 4 (four) hours as needed.  90 capsule  0   No current facility-administered medications for this visit.    SURGICAL HISTORY:  Past Surgical History  Procedure Laterality Date  . Mastectomy    . Thyroidectomy  1965  . Appendectomy    . Tonsillectomy      REVIEW OF SYSTEMS:  General: fatigue (-), night sweats (-), fever (-), pain (-) Lymph: palpable nodes (-) HEENT: vision changes (-), mucositis (-), gum bleeding (-), epistaxis (-) Cardiovascular: chest pain (-), palpitations (-) Pulmonary: shortness of breath (-), dyspnea on exertion (-), cough (-), hemoptysis (-) GI:  Early satiety (-), melena (-), dysphagia (-), nausea/vomiting (-), diarrhea (-) GU: dysuria (-), hematuria (-), incontinence (-) Musculoskeletal: joint swelling (-), joint pain (+), back pain (-) Neuro: weakness (-), numbness (-), headache (-), confusion (-) Skin: Rash (-), lesions (-), dryness (-) Psych: depression (-),  suicidal/homicidal ideation (-), feeling of hopelessness (-)   PHYSICAL EXAMINATION:  BP 116/71  Pulse 73  Temp(Src) 98.6 F (37 C) (Oral)  Resp 20  Ht 5\' 2"  (1.575 m)  Wt 134 lb 8 oz (61.009 kg)  BMI 24.59 kg/m2 General: Patient is a well appearing female in no acute distress HEENT: PERRLA, sclerae anicteric no conjunctival pallor, MMM  Neck: supple, no palpable adenopathy Lungs: clear to auscultation bilaterally, no wheezes, rhonchi, or rales Cardiovascular: regular rate  rhythm, S1, S2, no murmurs, rubs or gallops Abdomen: Soft, mild epigastric tenderness, non-distended, normoactive bowel sounds, no HSM Extremities: warm and well perfused, no clubbing, cyanosis, or edema Skin: bilateral upper extremities with erythema that is improving on inner forearm until just above the antecubital fossa.  There is no warmth or tenderness.   Neuro: Non-focal ECOG PERFORMANCE STATUS: 1 - Symptomatic but completely ambulatory  LABORATORY DATA: Lab Results  Component Value Date   WBC 4.3 03/11/2013   HGB 11.3* 03/11/2013   HCT 35.0 03/11/2013   MCV 88.3 03/11/2013   PLT 194 03/11/2013      Chemistry      Component Value Date/Time   NA 140 03/11/2013 0848   NA 136 07/06/2012 1122   NA 142 06/01/2012 1008   K 3.8 03/11/2013 0848   K 3.8 07/06/2012 1122   K 3.8 06/01/2012 1008   CL 107 03/11/2013 0848   CL 102 07/06/2012 1122   CL 103 06/01/2012 1008   CO2 23 03/11/2013 0848   CO2 20 07/06/2012 1122   CO2 25 06/01/2012 1008   BUN 13.7 03/11/2013 0848   BUN 8 07/06/2012 1122   BUN 9 06/01/2012 1008   CREATININE 0.8 03/11/2013 0848   CREATININE 0.60 07/06/2012 1122   CREATININE 0.9 06/01/2012 1008      Component Value Date/Time   CALCIUM 9.7 03/11/2013 0848   CALCIUM 9.1 07/06/2012 1122   CALCIUM 9.8 06/01/2012 1008   ALKPHOS 65 03/11/2013 0848   ALKPHOS 69 02/10/2012 1035   ALKPHOS 78 08/14/2010 1251   AST 15 03/11/2013 0848   AST 20 02/10/2012 1035   AST 19 08/14/2010 1251   ALT 9 03/11/2013 0848   ALT 19 02/10/2012 1035   BILITOT 0.31 03/11/2013 0848   BILITOT 0.4 02/10/2012 1035   BILITOT 0.40 08/14/2010 1251       RADIOGRAPHIC STUDIES: NUCLEAR MEDICINE PET CT SKULL BASE TO THIGH  Technique: Technique: 18.8 mCi F-18 FDG was injected  intravenously. CT data was obtained and used for attenuation  correction and anatomic localization only. (This was not acquired  as a diagnostic CT examination.) Additional exam technical data  entered on technologist worksheet.  Comparison: PET of 02/07/2011. CTs of  06/01/2012.  Findings: Neck: Hypermetabolism within the left side of the  mandible, with suspicion of concurrent periapical focal lucency on  image 24.  Bilateral cervical hypermetabolic lymph nodes. Hypermetabolism  which is primarily felt to be nodal surrounds the right jugular  vein and extends into the thoracic inlet. This measures a S.U.V.  max of 9.9 on image 57  Chest: Hypermetabolic right axillary node. This measures 1.7 cm  and a S.U.V. max of 6.1 on image 82. On the prior PET, this node  measured 1.7 cm and a S.U.V. max of 3.1.  Small prevascular nodes which are hypermetabolic, including on  image 72.  Abdomen/Pelvis: Right adrenal hypermetabolism which is without CT  correlate. Hypermetabolic left external iliac adenopathy. This  measures 1.3 cm and a  S.U.V. max of 5.0 on image 181. On the prior  PET, this node measured 1.6 cm and on the order of a S.U.V. max of  2.6.  Skelton: Multifocal marrow hypermetabolism consistent with osseous  involvement. Index lesion in the right humeral head measures a  S.U.V. max of 14.4 on image 53 and is new.  A hypermetabolic lytic lesion within the left iliac wing is new or  progressive and measures a S.U.V. max of 8.1 on image 155.  CT images performed for attenuation correction demonstrate  increased number and size of lymph nodes within the neck. Index  right jugulodigastric node measures 9 mm short axis on image 34  versus 6 mm on the prior PET. Chest, abdomen, and pelvic findings  deferred to recent diagnostic CTs. Trace right-sided pleural fluid  is new. Left upper lobe radiation fibrosis. Punctate left renal  calculi. Cholelithiasis. Fibroid uterus. L4 osseous metastasis  has possible epidural component on image 144 transverse.  IMPRESSION:  1. Increasing hypermetabolism associated with adenopathy within  the neck, chest, and pelvis, as detailed above. Findings are most  likely indicative of progression of lymphoma.  2.  Progressive osseous metastasis, as detailed above. An L4 lesion  has possible epidural component. Consider pre and post contrast  lumbar spine MRI.  3. Hypermetabolism at the right lower neck and thoracic inlet  surrounds the right internal jugular vein. This appears somewhat  hyperattenuating. Cannot exclude thrombus. Consider right upper  extremity venous ultrasound with attention to this area.  4. Likely dental inflammatory left mandibular hypermetabolism.  Consider physical exam correlation.   ASSESSMENT: 78 year old female with  #1Patient with history of low-grade non-Hodgkin lymphoma she initially received CVP Rituxan and then subsequently on maintenance Rituxan, now on R Bendamustine.  She had a PET/CT on 1/10 which demonstrated response to her therapy. She will receive a total of 6 cycles R Bendamustine and the began maintenance Rituxan on 3/28.  #2 metastatic breast carcinoma originally diagnosed in 2009 originally on letrozole 2.5 mg daily, now on Arimidex.  #3 L4 bone metastasis which is painful. Patient is now status post palliative radiation therapy.  Also receiving Xgeva q28 days.     PLAN:  #1 Ms. Reis is doing well today. She will proceed with Rituxan today.  She is also due for her Rivka Barbara today.    #2 She is taking her Arimidex and tolerating it well.    #3 We will see her back in 3 weeks for her next dose.    All questions were answered. The patient knows to call the clinic with any problems, questions or concerns. We can certainly see the patient much sooner if necessary.  I spent 25 minutes counseling the patient face to face. The total time spent in the appointment was 30 minutes.  Cherie Ouch Lyn Hollingshead, NP Medical Oncology [facility] Phone: [phone]

## 2013-03-11 NOTE — Patient Instructions (Addendum)
[facility] Discharge Instructions for Patients Receiving Chemotherapy  Today you received the following chemotherapy agents :Rituxan  To help prevent nausea and vomiting after your treatment, we encourage you to take your nausea medication as often as prescribed.   If you develop nausea and vomiting that is not controlled by your nausea medication, call the clinic.   BELOW ARE SYMPTOMS THAT SHOULD BE REPORTED IMMEDIATELY:  *FEVER GREATER THAN 100.5 F  *CHILLS WITH OR WITHOUT FEVER  NAUSEA AND VOMITING THAT IS NOT CONTROLLED WITH YOUR NAUSEA MEDICATION  *UNUSUAL SHORTNESS OF BREATH  *UNUSUAL BRUISING OR BLEEDING  TENDERNESS IN MOUTH AND THROAT WITH OR WITHOUT PRESENCE OF ULCERS  *URINARY PROBLEMS  *BOWEL PROBLEMS  UNUSUAL RASH Items with * indicate a potential emergency and should be followed up as soon as possible.

## 2013-03-14 ENCOUNTER — Telehealth: Payer: Self-pay | Admitting: Oncology

## 2013-03-14 ENCOUNTER — Telehealth: Payer: Self-pay | Admitting: *Deleted

## 2013-03-14 NOTE — Telephone Encounter (Signed)
Per staff message and POF I have scheduled appts.  JMW  

## 2013-03-18 ENCOUNTER — Ambulatory Visit (HOSPITAL_BASED_OUTPATIENT_CLINIC_OR_DEPARTMENT_OTHER): Payer: Medicare Other

## 2013-03-18 VITALS — BP 88/63 | HR 78 | Temp 98.4°F

## 2013-03-18 DIAGNOSIS — C7952 Secondary malignant neoplasm of bone marrow: Secondary | ICD-10-CM

## 2013-03-18 DIAGNOSIS — C50919 Malignant neoplasm of unspecified site of unspecified female breast: Secondary | ICD-10-CM

## 2013-03-18 DIAGNOSIS — C7951 Secondary malignant neoplasm of bone: Secondary | ICD-10-CM

## 2013-03-18 MED ORDER — DENOSUMAB 120 MG/1.7ML ~~LOC~~ SOLN
120.0000 mg | Freq: Once | SUBCUTANEOUS | Status: AC
  Administered 2013-03-18: 120 mg via SUBCUTANEOUS
  Filled 2013-03-18: qty 1.7

## 2013-03-22 ENCOUNTER — Other Ambulatory Visit: Payer: Self-pay | Admitting: Emergency Medicine

## 2013-03-22 MED ORDER — OXYCODONE HCL 5 MG PO CAPS: 5.0000 mg | ORAL_CAPSULE | ORAL | Status: DC | PRN

## 2013-04-01 ENCOUNTER — Other Ambulatory Visit: Payer: Medicare Other | Admitting: Lab

## 2013-04-01 ENCOUNTER — Ambulatory Visit (HOSPITAL_BASED_OUTPATIENT_CLINIC_OR_DEPARTMENT_OTHER): Payer: Medicare Other

## 2013-04-01 ENCOUNTER — Ambulatory Visit: Payer: Medicare Other

## 2013-04-01 ENCOUNTER — Other Ambulatory Visit (HOSPITAL_BASED_OUTPATIENT_CLINIC_OR_DEPARTMENT_OTHER): Payer: Medicare Other | Admitting: Lab

## 2013-04-01 ENCOUNTER — Telehealth: Payer: Self-pay | Admitting: Oncology

## 2013-04-01 ENCOUNTER — Ambulatory Visit: Payer: Medicare Other | Admitting: Adult Health

## 2013-04-01 ENCOUNTER — Ambulatory Visit (HOSPITAL_BASED_OUTPATIENT_CLINIC_OR_DEPARTMENT_OTHER): Payer: Medicare Other | Admitting: Oncology

## 2013-04-01 VITALS — BP 143/70 | HR 72 | Temp 98.4°F | Resp 20 | Ht 62.0 in | Wt 133.0 lb

## 2013-04-01 VITALS — BP 123/67 | HR 65 | Temp 97.2°F | Resp 20

## 2013-04-01 DIAGNOSIS — C8589 Other specified types of non-Hodgkin lymphoma, extranodal and solid organ sites: Secondary | ICD-10-CM

## 2013-04-01 DIAGNOSIS — Z853 Personal history of malignant neoplasm of breast: Secondary | ICD-10-CM

## 2013-04-01 DIAGNOSIS — M8580 Other specified disorders of bone density and structure, unspecified site: Secondary | ICD-10-CM

## 2013-04-01 DIAGNOSIS — Z5112 Encounter for antineoplastic immunotherapy: Secondary | ICD-10-CM

## 2013-04-01 DIAGNOSIS — C50919 Malignant neoplasm of unspecified site of unspecified female breast: Secondary | ICD-10-CM

## 2013-04-01 DIAGNOSIS — C7952 Secondary malignant neoplasm of bone marrow: Secondary | ICD-10-CM

## 2013-04-01 DIAGNOSIS — C8590 Non-Hodgkin lymphoma, unspecified, unspecified site: Secondary | ICD-10-CM

## 2013-04-01 DIAGNOSIS — C7951 Secondary malignant neoplasm of bone: Secondary | ICD-10-CM

## 2013-04-01 LAB — CBC WITH DIFFERENTIAL/PLATELET
Eosinophils Absolute: 0.2 10*3/uL (ref 0.0–0.5)
LYMPH%: 16.8 % (ref 14.0–49.7)
MCHC: 32 g/dL (ref 31.5–36.0)
MCV: 88.7 fL (ref 79.5–101.0)
MONO%: 13.9 % (ref 0.0–14.0)
NEUT#: 1.7 10*3/uL (ref 1.5–6.5)
NEUT%: 61.7 % (ref 38.4–76.8)
Platelets: 202 10*3/uL (ref 145–400)
RBC: 3.91 10*6/uL (ref 3.70–5.45)
nRBC: 2 % — ABNORMAL HIGH (ref 0–0)

## 2013-04-01 LAB — COMPREHENSIVE METABOLIC PANEL (CC13)
ALT: 15 U/L (ref 0–55)
BUN: 8.4 mg/dL (ref 7.0–26.0)
CO2: 27 mEq/L (ref 22–29)
Creatinine: 0.7 mg/dL (ref 0.6–1.1)
Total Bilirubin: 0.29 mg/dL (ref 0.20–1.20)

## 2013-04-01 MED ORDER — SODIUM CHLORIDE 0.9 % IV SOLN
375.0000 mg/m2 | Freq: Once | INTRAVENOUS | Status: AC
  Administered 2013-04-01: 600 mg via INTRAVENOUS
  Filled 2013-04-01: qty 60

## 2013-04-01 MED ORDER — SODIUM CHLORIDE 0.9 % IV SOLN
Freq: Once | INTRAVENOUS | Status: AC
  Administered 2013-04-01: 11:00:00 via INTRAVENOUS

## 2013-04-01 MED ORDER — SODIUM CHLORIDE 0.9 % IJ SOLN
10.0000 mL | INTRAMUSCULAR | Status: DC | PRN
Start: 2013-04-01 — End: 2013-04-01
  Administered 2013-04-01: 10 mL
  Filled 2013-04-01: qty 10

## 2013-04-01 MED ORDER — HEPARIN SOD (PORK) LOCK FLUSH 100 UNIT/ML IV SOLN
500.0000 [IU] | Freq: Once | INTRAVENOUS | Status: AC | PRN
  Administered 2013-04-01: 500 [IU]
  Filled 2013-04-01: qty 5

## 2013-04-01 MED ORDER — ACETAMINOPHEN 325 MG PO TABS
650.0000 mg | ORAL_TABLET | Freq: Once | ORAL | Status: AC
  Administered 2013-04-01: 650 mg via ORAL

## 2013-04-01 MED ORDER — DIPHENHYDRAMINE HCL 25 MG PO CAPS
50.0000 mg | ORAL_CAPSULE | Freq: Once | ORAL | Status: AC
  Administered 2013-04-01: 50 mg via ORAL

## 2013-04-01 NOTE — Telephone Encounter (Signed)
, °

## 2013-04-01 NOTE — Patient Instructions (Addendum)
[facility] Discharge Instructions for Patients Receiving Chemotherapy  Today you received the following chemotherapy agents Rituxan.   If you develop nausea and vomiting that is not controlled by your nausea medication, call the clinic. If it is after clinic hours your family physician or the after hours number for the clinic or go to the Emergency Department.   BELOW ARE SYMPTOMS THAT SHOULD BE REPORTED IMMEDIATELY:  *FEVER GREATER THAN 100.5 F  *CHILLS WITH OR WITHOUT FEVER  NAUSEA AND VOMITING THAT IS NOT CONTROLLED WITH YOUR NAUSEA MEDICATION  *UNUSUAL SHORTNESS OF BREATH  *UNUSUAL BRUISING OR BLEEDING  TENDERNESS IN MOUTH AND THROAT WITH OR WITHOUT PRESENCE OF ULCERS  *URINARY PROBLEMS  *BOWEL PROBLEMS  UNUSUAL RASH Items with * indicate a potential emergency and should be followed up as soon as possible.

## 2013-04-04 ENCOUNTER — Other Ambulatory Visit: Payer: Self-pay | Admitting: *Deleted

## 2013-04-04 DIAGNOSIS — C50919 Malignant neoplasm of unspecified site of unspecified female breast: Secondary | ICD-10-CM

## 2013-04-04 MED ORDER — ANASTROZOLE 1 MG PO TABS: 1.0000 mg | ORAL_TABLET | Freq: Every day | ORAL | Status: DC

## 2013-04-15 ENCOUNTER — Ambulatory Visit: Payer: Medicare Other

## 2013-04-22 ENCOUNTER — Other Ambulatory Visit (HOSPITAL_BASED_OUTPATIENT_CLINIC_OR_DEPARTMENT_OTHER): Payer: Medicare Other | Admitting: Lab

## 2013-04-22 ENCOUNTER — Encounter: Payer: Self-pay | Admitting: Adult Health

## 2013-04-22 ENCOUNTER — Ambulatory Visit (HOSPITAL_BASED_OUTPATIENT_CLINIC_OR_DEPARTMENT_OTHER): Payer: Medicare Other

## 2013-04-22 ENCOUNTER — Telehealth: Payer: Self-pay | Admitting: Oncology

## 2013-04-22 ENCOUNTER — Ambulatory Visit: Payer: Medicare Other

## 2013-04-22 ENCOUNTER — Ambulatory Visit (HOSPITAL_BASED_OUTPATIENT_CLINIC_OR_DEPARTMENT_OTHER): Payer: Medicare Other | Admitting: Adult Health

## 2013-04-22 VITALS — BP 154/78 | HR 53 | Temp 97.4°F | Resp 20 | Ht 62.0 in | Wt 132.7 lb

## 2013-04-22 VITALS — BP 123/52 | HR 59 | Temp 98.0°F | Resp 18

## 2013-04-22 DIAGNOSIS — C7952 Secondary malignant neoplasm of bone marrow: Secondary | ICD-10-CM

## 2013-04-22 DIAGNOSIS — C50919 Malignant neoplasm of unspecified site of unspecified female breast: Secondary | ICD-10-CM

## 2013-04-22 DIAGNOSIS — C8589 Other specified types of non-Hodgkin lymphoma, extranodal and solid organ sites: Secondary | ICD-10-CM

## 2013-04-22 DIAGNOSIS — C50912 Malignant neoplasm of unspecified site of left female breast: Secondary | ICD-10-CM

## 2013-04-22 DIAGNOSIS — R6884 Jaw pain: Secondary | ICD-10-CM

## 2013-04-22 DIAGNOSIS — C8590 Non-Hodgkin lymphoma, unspecified, unspecified site: Secondary | ICD-10-CM

## 2013-04-22 DIAGNOSIS — K0889 Other specified disorders of teeth and supporting structures: Secondary | ICD-10-CM

## 2013-04-22 DIAGNOSIS — Z5112 Encounter for antineoplastic immunotherapy: Secondary | ICD-10-CM

## 2013-04-22 LAB — CBC WITH DIFFERENTIAL/PLATELET
BASO%: 0.8 % (ref 0.0–2.0)
Eosinophils Absolute: 0.2 10*3/uL (ref 0.0–0.5)
HCT: 34.6 % — ABNORMAL LOW (ref 34.8–46.6)
LYMPH%: 12.4 % — ABNORMAL LOW (ref 14.0–49.7)
MCHC: 31.8 g/dL (ref 31.5–36.0)
MONO#: 0.7 10*3/uL (ref 0.1–0.9)
NEUT#: 2.2 10*3/uL (ref 1.5–6.5)
NEUT%: 62.2 % (ref 38.4–76.8)
Platelets: 165 10*3/uL (ref 145–400)
RBC: 3.93 10*6/uL (ref 3.70–5.45)
WBC: 3.6 10*3/uL — ABNORMAL LOW (ref 3.9–10.3)
lymph#: 0.4 10*3/uL — ABNORMAL LOW (ref 0.9–3.3)
nRBC: 0 % (ref 0–0)

## 2013-04-22 LAB — COMPREHENSIVE METABOLIC PANEL (CC13)
CO2: 23 mEq/L (ref 22–29)
Calcium: 9.8 mg/dL (ref 8.4–10.4)
Chloride: 107 mEq/L (ref 98–107)
Glucose: 89 mg/dl (ref 70–99)
Sodium: 139 mEq/L (ref 136–145)
Total Bilirubin: 0.25 mg/dL (ref 0.20–1.20)
Total Protein: 7.1 g/dL (ref 6.4–8.3)

## 2013-04-22 MED ORDER — SODIUM CHLORIDE 0.9 % IV SOLN
375.0000 mg/m2 | Freq: Once | INTRAVENOUS | Status: AC
  Administered 2013-04-22: 600 mg via INTRAVENOUS
  Filled 2013-04-22: qty 60

## 2013-04-22 MED ORDER — DIPHENHYDRAMINE HCL 25 MG PO CAPS
50.0000 mg | ORAL_CAPSULE | Freq: Once | ORAL | Status: AC
  Administered 2013-04-22: 50 mg via ORAL

## 2013-04-22 MED ORDER — SODIUM CHLORIDE 0.9 % IV SOLN
Freq: Once | INTRAVENOUS | Status: AC
  Administered 2013-04-22: 11:00:00 via INTRAVENOUS

## 2013-04-22 MED ORDER — HEPARIN SOD (PORK) LOCK FLUSH 100 UNIT/ML IV SOLN
500.0000 [IU] | Freq: Once | INTRAVENOUS | Status: AC | PRN
  Administered 2013-04-22: 500 [IU]
  Filled 2013-04-22: qty 5

## 2013-04-22 MED ORDER — SODIUM CHLORIDE 0.9 % IJ SOLN
10.0000 mL | INTRAMUSCULAR | Status: DC | PRN
  Administered 2013-04-22: 10 mL
  Filled 2013-04-22: qty 10

## 2013-04-22 MED ORDER — ACETAMINOPHEN 325 MG PO TABS
650.0000 mg | ORAL_TABLET | Freq: Once | ORAL | Status: AC
  Administered 2013-04-22: 650 mg via ORAL

## 2013-04-22 NOTE — Progress Notes (Signed)
OFFICE PROGRESS NOTE  CC  [name], MD [address] [facility] [address] [place], [address] [place] [address]  DIAGNOSIS: 78 year old female with:  #1 left breast carcinoma originally presenting as a fungating mass. Patient underwent neoadjuvant chemotherapy followed by mastectomy. She is currently on Arimidex 1mg  daily.   #2 low-grade non-Hodgkin lymphoma status post CVP and Rituxan now receiving R-Bendamustine, followed by maintenance Rituxan.   PRIOR THERAPY:  #1 the patient was diagnosed with metastatic breast carcinoma beginning October 2009. She returned her 1 chemotherapy followed by mastectomy radiation. She was on letrozole 2.5 mg daily, but has changed to Arimidex 1mg  daily.  Also receives Xgeva every 28 days.    #2 Was diagnosed with low-grade non-Hodgkin lymphoma  12. She received 6 cycles of CVP and Rituxan, followed by maintenance Rituxan, who recurred and is now on R-Bendamustine.    #3 Pt underwent 4 cycles of R-Bendamustin PET/CT on 1/10 showed CR to therapy, will complete 2 cycles of treatment and the proceed to maintenance Rituxan.  #4 Maintenance Rituximab every 3 weeks started on 01/28/13.  CURRENT THERAPY:  Arimidex daily, Xgeva, and Maintenance Rituximab  INTERVAL HISTORY: Helen [name] 78 y.o. female returns for evaluation today prior to her maintenance rituximab.  She is doing well today.  She has some ocular lesions that she is having removed on Tuesday.  She does have some jaw and tooth pain in her right upper molars.  She states that her tooth broke.  She denies fevers, chills, nausea, vomiting, night sweats, unintentional weight loss, new pain, hot flashes, or any further concerns.  A 10 point ROS is otherwise neg.    MEDICAL HISTORY: Past Medical History  Diagnosis Date  . Arthritis   . Blood transfusion 2009  . Breast CA 08/2008    (LT) breast ca dx 10/09/Chemo  . Lymphoma 09/09/2011    NHL  . Leukemia, acute, in remission 2012    Pt. not sure of  type  . Hypertension   . Seizures 1980's    from medication reaction/Pt.  [name] Hx of radiation therapy 09/11/08 -10/31/08    left breast  . Metastasis to bone 07/03/2012    MRI L spine  . History of radiation therapy eot 07/30/12    lumbar spine L4 /l shoulder    ALLERGIES:  is allergic to codeine; penicillins; sulfonamide derivatives; tape; and tramadol hcl.  MEDICATIONS:  Current Outpatient Prescriptions  Medication Sig Dispense Refill  . anastrozole (ARIMIDEX) 1 MG tablet Take 1 tablet (1 mg total) by mouth daily.  30 tablet  4  . calcium-vitamin D (OSCAL WITH D) 500-200 MG-UNIT per tablet Take 2 tablets by mouth daily.      [name] oxycodone (OXY-IR) 5 MG capsule Take 1 capsule (5 mg total) by mouth every 4 (four) hours as needed.  90 capsule  0   No current facility-administered medications for this visit.    SURGICAL HISTORY:  Past Surgical History  Procedure Laterality Date  . Mastectomy    . Thyroidectomy  1965  . Appendectomy    . Tonsillectomy      REVIEW OF SYSTEMS:  General: fatigue (-), night sweats (-), fever (-), pain (-) Lymph: palpable nodes (-) HEENT: vision changes (-), mucositis (-), gum bleeding (-), epistaxis (-) Cardiovascular: chest pain (-), palpitations (-) Pulmonary: shortness of breath (-), dyspnea on exertion (-), cough (-), hemoptysis (-) GI:  Early satiety (-), melena (-), dysphagia (-), nausea/vomiting (-), diarrhea (-) GU: dysuria (-),  hematuria (-), incontinence (-) Musculoskeletal: joint swelling (-), joint pain (+), back pain (-) Neuro: weakness (-), numbness (-), headache (-), confusion (-) Skin: Rash (-), lesions (-), dryness (-) Psych: depression (-), suicidal/homicidal ideation (-), feeling of hopelessness (-)   PHYSICAL EXAMINATION:  BP 154/78  Pulse 53  Temp(Src) 97.4 F (36.3 C) (Oral)  Resp 20  Ht 5\' 2"  (1.575 m)  Wt 132 lb 11.2 oz (60.192 kg)  BMI 24.26 kg/m2 General: Patient is a well appearing female in no acute  distress HEENT: PERRLA, sclerae anicteric no conjunctival pallor, MMM, no facial swelling, tenderness, right 3rd molar appears broken.   Neck: supple, no palpable adenopathy Lungs: clear to auscultation bilaterally, no wheezes, rhonchi, or rales Cardiovascular: regular rate rhythm, S1, S2, no murmurs, rubs or gallops Abdomen: Soft, mild epigastric tenderness, non-distended, normoactive bowel sounds, no HSM Extremities: warm and well perfused, no clubbing, cyanosis, or edema Skin: No rash or lesion   Neuro: Non-focal ECOG PERFORMANCE STATUS: 1 - Symptomatic but completely ambulatory  LABORATORY DATA: Lab Results  Component Value Date   WBC 3.6* 04/22/2013   HGB 11.0* 04/22/2013   HCT 34.6* 04/22/2013   MCV 88.0 04/22/2013   PLT 165 04/22/2013      Chemistry      Component Value Date/Time   NA 141 04/01/2013 0911   NA 136 07/06/2012 1122   NA 142 06/01/2012 1008   K 3.6 04/01/2013 0911   K 3.8 07/06/2012 1122   K 3.8 06/01/2012 1008   CL 106 04/01/2013 0911   CL 102 07/06/2012 1122   CL 103 06/01/2012 1008   CO2 27 04/01/2013 0911   CO2 20 07/06/2012 1122   CO2 25 06/01/2012 1008   BUN 8.4 04/01/2013 0911   BUN 8 07/06/2012 1122   BUN 9 06/01/2012 1008   CREATININE 0.7 04/01/2013 0911   CREATININE 0.60 07/06/2012 1122   CREATININE 0.9 06/01/2012 1008      Component Value Date/Time   CALCIUM 10.0 04/01/2013 0911   CALCIUM 9.1 07/06/2012 1122   CALCIUM 9.8 06/01/2012 1008   ALKPHOS 63 04/01/2013 0911   ALKPHOS 69 02/10/2012 1035   ALKPHOS 78 08/14/2010 1251   AST 17 04/01/2013 0911   AST 20 02/10/2012 1035   AST 19 08/14/2010 1251   ALT 15 04/01/2013 0911   ALT 19 02/10/2012 1035   BILITOT 0.29 04/01/2013 0911   BILITOT 0.4 02/10/2012 1035   BILITOT 0.40 08/14/2010 1251       RADIOGRAPHIC STUDIES: NUCLEAR MEDICINE PET CT SKULL BASE TO THIGH  Technique: Technique: 18.8 mCi F-18 FDG was injected  intravenously. CT data was obtained and used for attenuation  correction and anatomic localization only.  (This was not acquired  as a diagnostic CT examination.) Additional exam technical data  entered on technologist worksheet.  Comparison: PET of 02/07/2011. CTs of 06/01/2012.  Findings: Neck: Hypermetabolism within the left side of the  mandible, with suspicion of concurrent periapical focal lucency on  image 24.  Bilateral cervical hypermetabolic lymph nodes. Hypermetabolism  which is primarily felt to be nodal surrounds the right jugular  vein and extends into the thoracic inlet. This measures a S.U.V.  max of 9.9 on image 57  Chest: Hypermetabolic right axillary node. This measures 1.7 cm  and a S.U.V. max of 6.1 on image 82. On the prior PET, this node  measured 1.7 cm and a S.U.V. max of 3.1.  Small prevascular nodes which are hypermetabolic, including on  image 72.  Abdomen/Pelvis: Right adrenal hypermetabolism which is without CT  correlate. Hypermetabolic left external iliac adenopathy. This  measures 1.3 cm and a S.U.V. max of 5.0 on image 181. On the prior  PET, this node measured 1.6 cm and on the order of a S.U.V. max of  2.6.  Skelton: Multifocal marrow hypermetabolism consistent with osseous  involvement. Index lesion in the right humeral head measures a  S.U.V. max of 14.4 on image 53 and is new.  A hypermetabolic lytic lesion within the left iliac wing is new or  progressive and measures a S.U.V. max of 8.1 on image 155.  CT images performed for attenuation correction demonstrate  increased number and size of lymph nodes within the neck. Index  right jugulodigastric node measures 9 mm short axis on image 34  versus 6 mm on the prior PET. Chest, abdomen, and pelvic findings  deferred to recent diagnostic CTs. Trace right-sided pleural fluid  is new. Left upper lobe radiation fibrosis. Punctate left renal  calculi. Cholelithiasis. Fibroid uterus. L4 osseous metastasis  has possible epidural component on image 144 transverse.  IMPRESSION:  1. Increasing hypermetabolism  associated with adenopathy within  the neck, chest, and pelvis, as detailed above. Findings are most  likely indicative of progression of lymphoma.  2. Progressive osseous metastasis, as detailed above. An L4 lesion  has possible epidural component. Consider pre and post contrast  lumbar spine MRI.  3. Hypermetabolism at the right lower neck and thoracic inlet  surrounds the right internal jugular vein. This appears somewhat  hyperattenuating. Cannot exclude thrombus. Consider right upper  extremity venous ultrasound with attention to this area.  4. Likely dental inflammatory left mandibular hypermetabolism.  Consider physical exam correlation.   ASSESSMENT: 78 year old female with  #1Patient with history of low-grade non-Hodgkin lymphoma she initially received CVP Rituxan and then subsequently on maintenance Rituxan, now on R Bendamustine.  She had a PET/CT on 1/10 which demonstrated response to her therapy. She will receive a total of 6 cycles R Bendamustine and the began maintenance Rituxan on 3/28.  #2 metastatic breast carcinoma originally diagnosed in 2009 originally on letrozole 2.5 mg daily, now on Arimidex.  #3 L4 bone metastasis which is painful. Patient is now status post palliative radiation therapy.  Also receiving Xgeva q28 days (currently on hold).     PLAN:  #1 Ms. Stines is doing well today. She will proceed with Rituxan today.  I referred her to Dr. Kristin Bruins to evaluate her and rule out osteonecrosis before receiving any further xgeva therapy.      #2 She is taking her Arimidex and tolerating it well.    #3 We will see her back in 3 weeks for her next dose.    All questions were answered. The patient knows to call the clinic with any problems, questions or concerns. We can certainly see the patient much sooner if necessary.  I spent 25 minutes counseling the patient face to face. The total time spent in the appointment was 30 minutes.  Cherie Ouch Lyn Hollingshead,  NP Medical Oncology [facility] Phone: [phone]

## 2013-04-22 NOTE — Patient Instructions (Addendum)
Doing well.  Proceed with treatment.  Please call us if you have any questions or concerns.    

## 2013-04-22 NOTE — Patient Instructions (Signed)
[facility] Discharge Instructions for Patients Receiving Chemotherapy  Today you received the following chemotherapy agents: Rituxan.  To help prevent nausea and vomiting after your treatment, we encourage you to take your nausea medication as needed.   If you develop nausea and vomiting that is not controlled by your nausea medication, call the clinic.   BELOW ARE SYMPTOMS THAT SHOULD BE REPORTED IMMEDIATELY:  *FEVER GREATER THAN 100.5 F  *CHILLS WITH OR WITHOUT FEVER  NAUSEA AND VOMITING THAT IS NOT CONTROLLED WITH YOUR NAUSEA MEDICATION  *UNUSUAL SHORTNESS OF BREATH  *UNUSUAL BRUISING OR BLEEDING  TENDERNESS IN MOUTH AND THROAT WITH OR WITHOUT PRESENCE OF ULCERS  *URINARY PROBLEMS  *BOWEL PROBLEMS  UNUSUAL RASH Items with * indicate a potential emergency and should be followed up as soon as possible.  Feel free to call the clinic should you have any questions or concerns. The clinic phone number is [phone].

## 2013-04-25 NOTE — Progress Notes (Signed)
OFFICE PROGRESS NOTE  CC  [name], MD [address] [facility] [address] [place], [address] [place] [address]  DIAGNOSIS: 78 year old female with:  #1 left breast carcinoma originally presenting as a fungating mass. Patient underwent neoadjuvant chemotherapy followed by mastectomy. She is currently on Arimidex 1mg  daily.   #2 low-grade non-Hodgkin lymphoma status post CVP and Rituxan now receiving R-Bendamustine.   PRIOR THERAPY:  #1 the patient was diagnosed with metastatic breast carcinoma beginning October 2009. She returned her 1 chemotherapy followed by mastectomy radiation. She was on letrozole 2.5 mg daily, but has changed to Arimidex 1mg  daily.  Also receives Xgeva every 28 days.    #2 Was diagnosed with low-grade non-Hodgkin lymphoma  12. She received 6 cycles of CVP and Rituxan, followed by maintenance Rituxan, who recurred and is now on R-Bendamustine.    #3 Pt underwent 4 cycles of R-Bendamustin PET/CT on 1/10 showed CR to therapy, will complete 2 cycles of treatment and the proceed to maintenance Rituxan.  #4 Maintenance Rituximab every 3 weeks started on 01/28/13.  CURRENT THERAPY:  Arimidex daily, Xgeva, and Maintenance Rituximab  INTERVAL HISTORY: Helen [name] 78 y.o. female returns for evaluation today prior to her maintenance rituximab.  She is doing well today.  She denies fevers, chills, weight loss, swelling ,pain.  She does have mild joint aches in her knees.  She takes her Arimidex every day.  Otherwise, a 10 point ROS is neg.   MEDICAL HISTORY: Past Medical History  Diagnosis Date  . Arthritis   . Blood transfusion 2009  . Breast CA 08/2008    (LT) breast ca dx 10/09/Chemo  . Lymphoma 09/09/2011    NHL  . Leukemia, acute, in remission 2012    Pt. not sure of type  . Hypertension   . Seizures 1980's    from medication reaction/Pt.  [name] Hx of radiation therapy 09/11/08 -10/31/08    left breast  . Metastasis to bone 07/03/2012    MRI L spine  . History  of radiation therapy eot 07/30/12    lumbar spine L4 /l shoulder    ALLERGIES:  is allergic to codeine; penicillins; sulfonamide derivatives; tape; and tramadol hcl.  MEDICATIONS:  Current Outpatient Prescriptions  Medication Sig Dispense Refill  . calcium-vitamin D (OSCAL WITH D) 500-200 MG-UNIT per tablet Take 2 tablets by mouth daily.      [name] oxycodone (OXY-IR) 5 MG capsule Take 1 capsule (5 mg total) by mouth every 4 (four) hours as needed.  90 capsule  0  . anastrozole (ARIMIDEX) 1 MG tablet Take 1 tablet (1 mg total) by mouth daily.  30 tablet  4   No current facility-administered medications for this visit.    SURGICAL HISTORY:  Past Surgical History  Procedure Laterality Date  . Mastectomy    . Thyroidectomy  1965  . Appendectomy    . Tonsillectomy      REVIEW OF SYSTEMS:  General: fatigue (-), night sweats (-), fever (-), pain (-) Lymph: palpable nodes (-) HEENT: vision changes (-), mucositis (-), gum bleeding (-), epistaxis (-) Cardiovascular: chest pain (-), palpitations (-) Pulmonary: shortness of breath (-), dyspnea on exertion (-), cough (-), hemoptysis (-) GI:  Early satiety (-), melena (-), dysphagia (-), nausea/vomiting (-), diarrhea (-) GU: dysuria (-), hematuria (-), incontinence (-) Musculoskeletal: joint swelling (-), joint pain (+), back pain (-) Neuro: weakness (-), numbness (-), headache (-), confusion (-) Skin: Rash (-), lesions (-), dryness (-) Psych: depression (-),  suicidal/homicidal ideation (-), feeling of hopelessness (-)   PHYSICAL EXAMINATION:  BP 143/70  Pulse 72  Temp(Src) 98.4 F (36.9 C) (Oral)  Resp 20  Ht 5\' 2"  (1.575 m)  Wt 133 lb (60.328 kg)  BMI 24.32 kg/m2 General: Patient is a well appearing female in no acute distress HEENT: PERRLA, sclerae anicteric no conjunctival pallor, MMM  Neck: supple, no palpable adenopathy Lungs: clear to auscultation bilaterally, no wheezes, rhonchi, or rales Cardiovascular: regular rate rhythm,  S1, S2, no murmurs, rubs or gallops Abdomen: Soft, mild epigastric tenderness, non-distended, normoactive bowel sounds, no HSM Extremities: warm and well perfused, no clubbing, cyanosis, or edema Skin: bilateral upper extremities with erythema that is improving on inner forearm until just above the antecubital fossa.  There is no warmth or tenderness.   Neuro: Non-focal ECOG PERFORMANCE STATUS: 1 - Symptomatic but completely ambulatory  LABORATORY DATA: Lab Results  Component Value Date   WBC 3.6* 04/22/2013   HGB 11.0* 04/22/2013   HCT 34.6* 04/22/2013   MCV 88.0 04/22/2013   PLT 165 04/22/2013      Chemistry      Component Value Date/Time   NA 139 04/22/2013 0922   NA 136 07/06/2012 1122   NA 142 06/01/2012 1008   K 4.0 04/22/2013 0922   K 3.8 07/06/2012 1122   K 3.8 06/01/2012 1008   CL 107 04/22/2013 0922   CL 102 07/06/2012 1122   CL 103 06/01/2012 1008   CO2 23 04/22/2013 0922   CO2 20 07/06/2012 1122   CO2 25 06/01/2012 1008   BUN 10.3 04/22/2013 0922   BUN 8 07/06/2012 1122   BUN 9 06/01/2012 1008   CREATININE 0.7 04/22/2013 0922   CREATININE 0.60 07/06/2012 1122   CREATININE 0.9 06/01/2012 1008      Component Value Date/Time   CALCIUM 9.8 04/22/2013 0922   CALCIUM 9.1 07/06/2012 1122   CALCIUM 9.8 06/01/2012 1008   ALKPHOS 66 04/22/2013 0922   ALKPHOS 69 02/10/2012 1035   ALKPHOS 78 08/14/2010 1251   AST 12 04/22/2013 0922   AST 20 02/10/2012 1035   AST 19 08/14/2010 1251   ALT 8 04/22/2013 0922   ALT 19 02/10/2012 1035   BILITOT 0.25 04/22/2013 0922   BILITOT 0.4 02/10/2012 1035   BILITOT 0.40 08/14/2010 1251       RADIOGRAPHIC STUDIES: NUCLEAR MEDICINE PET CT SKULL BASE TO THIGH  Technique: Technique: 18.8 mCi F-18 FDG was injected  intravenously. CT data was obtained and used for attenuation  correction and anatomic localization only. (This was not acquired  as a diagnostic CT examination.) Additional exam technical data  entered on technologist worksheet.  Comparison: PET of  02/07/2011. CTs of 06/01/2012.  Findings: Neck: Hypermetabolism within the left side of the  mandible, with suspicion of concurrent periapical focal lucency on  image 24.  Bilateral cervical hypermetabolic lymph nodes. Hypermetabolism  which is primarily felt to be nodal surrounds the right jugular  vein and extends into the thoracic inlet. This measures a S.U.V.  max of 9.9 on image 57  Chest: Hypermetabolic right axillary node. This measures 1.7 cm  and a S.U.V. max of 6.1 on image 82. On the prior PET, this node  measured 1.7 cm and a S.U.V. max of 3.1.  Small prevascular nodes which are hypermetabolic, including on  image 72.  Abdomen/Pelvis: Right adrenal hypermetabolism which is without CT  correlate. Hypermetabolic left external iliac adenopathy. This  measures 1.3 cm and a S.U.V. max  of 5.0 on image 181. On the prior  PET, this node measured 1.6 cm and on the order of a S.U.V. max of  2.6.  Skelton: Multifocal marrow hypermetabolism consistent with osseous  involvement. Index lesion in the right humeral head measures a  S.U.V. max of 14.4 on image 53 and is new.  A hypermetabolic lytic lesion within the left iliac wing is new or  progressive and measures a S.U.V. max of 8.1 on image 155.  CT images performed for attenuation correction demonstrate  increased number and size of lymph nodes within the neck. Index  right jugulodigastric node measures 9 mm short axis on image 34  versus 6 mm on the prior PET. Chest, abdomen, and pelvic findings  deferred to recent diagnostic CTs. Trace right-sided pleural fluid  is new. Left upper lobe radiation fibrosis. Punctate left renal  calculi. Cholelithiasis. Fibroid uterus. L4 osseous metastasis  has possible epidural component on image 144 transverse.  IMPRESSION:  1. Increasing hypermetabolism associated with adenopathy within  the neck, chest, and pelvis, as detailed above. Findings are most  likely indicative of progression of  lymphoma.  2. Progressive osseous metastasis, as detailed above. An L4 lesion  has possible epidural component. Consider pre and post contrast  lumbar spine MRI.  3. Hypermetabolism at the right lower neck and thoracic inlet  surrounds the right internal jugular vein. This appears somewhat  hyperattenuating. Cannot exclude thrombus. Consider right upper  extremity venous ultrasound with attention to this area.  4. Likely dental inflammatory left mandibular hypermetabolism.  Consider physical exam correlation.   ASSESSMENT: 78 year old female with  #1Patient with history of low-grade non-Hodgkin lymphoma she initially received CVP Rituxan and then subsequently on maintenance Rituxan, now on R Bendamustine.  She had a PET/CT on 1/10 which demonstrated response to her therapy. She will receive a total of 6 cycles R Bendamustine and the began maintenance Rituxan on 3/28.  #2 metastatic breast carcinoma originally diagnosed in 2009 originally on letrozole 2.5 mg daily, now on Arimidex.  #3 L4 bone metastasis which is painful. Patient is now status post palliative radiation therapy.  Also receiving Xgeva q28 days.     PLAN:  #1 Ms. Sobh is doing well today. She will proceed with Rituxan today.  She is also due for her Rivka Barbara today.    #2 She is taking her Arimidex and tolerating it well.    #3 We will see her back in 3 weeks for her next dose.    All questions were answered. The patient knows to call the clinic with any problems, questions or concerns. We can certainly see the patient much sooner if necessary.  I spent 25 minutes counseling the patient face to face. The total time spent in the appointment was 30 minutes.  Drue Second, MD Medical/Oncology [facility] [phone] (beeper) [phone] (Office)

## 2013-04-29 ENCOUNTER — Ambulatory Visit: Payer: Medicare Other

## 2013-05-13 ENCOUNTER — Ambulatory Visit (HOSPITAL_BASED_OUTPATIENT_CLINIC_OR_DEPARTMENT_OTHER): Payer: Medicare Other | Admitting: Adult Health

## 2013-05-13 ENCOUNTER — Encounter: Payer: Self-pay | Admitting: Adult Health

## 2013-05-13 ENCOUNTER — Ambulatory Visit (HOSPITAL_BASED_OUTPATIENT_CLINIC_OR_DEPARTMENT_OTHER): Payer: Medicare Other

## 2013-05-13 ENCOUNTER — Other Ambulatory Visit (HOSPITAL_BASED_OUTPATIENT_CLINIC_OR_DEPARTMENT_OTHER): Payer: Medicare Other | Admitting: Lab

## 2013-05-13 ENCOUNTER — Ambulatory Visit: Payer: Medicare Other

## 2013-05-13 VITALS — BP 143/7 | HR 70 | Temp 98.4°F | Resp 20 | Ht 62.0 in | Wt 136.1 lb

## 2013-05-13 VITALS — BP 125/67 | HR 65 | Temp 98.4°F | Resp 16

## 2013-05-13 DIAGNOSIS — C7951 Secondary malignant neoplasm of bone: Secondary | ICD-10-CM

## 2013-05-13 DIAGNOSIS — C8590 Non-Hodgkin lymphoma, unspecified, unspecified site: Secondary | ICD-10-CM

## 2013-05-13 DIAGNOSIS — C50919 Malignant neoplasm of unspecified site of unspecified female breast: Secondary | ICD-10-CM

## 2013-05-13 DIAGNOSIS — C8589 Other specified types of non-Hodgkin lymphoma, extranodal and solid organ sites: Secondary | ICD-10-CM

## 2013-05-13 DIAGNOSIS — K089 Disorder of teeth and supporting structures, unspecified: Secondary | ICD-10-CM

## 2013-05-13 DIAGNOSIS — M8708 Idiopathic aseptic necrosis of bone, other site: Secondary | ICD-10-CM

## 2013-05-13 DIAGNOSIS — Z5112 Encounter for antineoplastic immunotherapy: Secondary | ICD-10-CM

## 2013-05-13 DIAGNOSIS — L309 Dermatitis, unspecified: Secondary | ICD-10-CM

## 2013-05-13 DIAGNOSIS — C9501 Acute leukemia of unspecified cell type, in remission: Secondary | ICD-10-CM

## 2013-05-13 DIAGNOSIS — L259 Unspecified contact dermatitis, unspecified cause: Secondary | ICD-10-CM

## 2013-05-13 LAB — CBC WITH DIFFERENTIAL/PLATELET
BASO%: 0.9 % (ref 0.0–2.0)
Eosinophils Absolute: 0.2 10*3/uL (ref 0.0–0.5)
LYMPH%: 17.2 % (ref 14.0–49.7)
MCHC: 32 g/dL (ref 31.5–36.0)
MONO#: 0.5 10*3/uL (ref 0.1–0.9)
NEUT#: 1.1 10*3/uL — ABNORMAL LOW (ref 1.5–6.5)
Platelets: 168 10*3/uL (ref 145–400)
RBC: 3.82 10*6/uL (ref 3.70–5.45)
RDW: 14 % (ref 11.2–14.5)
WBC: 2.3 10*3/uL — ABNORMAL LOW (ref 3.9–10.3)
lymph#: 0.4 10*3/uL — ABNORMAL LOW (ref 0.9–3.3)

## 2013-05-13 LAB — COMPREHENSIVE METABOLIC PANEL (CC13)
ALT: 13 U/L (ref 0–55)
Albumin: 3.5 g/dL (ref 3.5–5.0)
CO2: 27 mEq/L (ref 22–29)
Calcium: 9.7 mg/dL (ref 8.4–10.4)
Chloride: 106 mEq/L (ref 98–109)
Glucose: 110 mg/dl (ref 70–140)
Potassium: 3.4 mEq/L — ABNORMAL LOW (ref 3.5–5.1)
Sodium: 141 mEq/L (ref 136–145)
Total Protein: 6.7 g/dL (ref 6.4–8.3)

## 2013-05-13 MED ORDER — DIPHENHYDRAMINE HCL 25 MG PO CAPS
50.0000 mg | ORAL_CAPSULE | Freq: Once | ORAL | Status: AC
  Administered 2013-05-13: 50 mg via ORAL

## 2013-05-13 MED ORDER — PREDNISONE (PAK) 10 MG PO TABS: 10.0000 mg | ORAL_TABLET | ORAL | Status: DC

## 2013-05-13 MED ORDER — HEPARIN SOD (PORK) LOCK FLUSH 100 UNIT/ML IV SOLN
500.0000 [IU] | Freq: Once | INTRAVENOUS | Status: AC | PRN
  Administered 2013-05-13: 500 [IU]
  Filled 2013-05-13: qty 5

## 2013-05-13 MED ORDER — SODIUM CHLORIDE 0.9 % IJ SOLN
10.0000 mL | INTRAMUSCULAR | Status: DC | PRN
  Administered 2013-05-13: 10 mL
  Filled 2013-05-13: qty 10

## 2013-05-13 MED ORDER — ACETAMINOPHEN 325 MG PO TABS
650.0000 mg | ORAL_TABLET | Freq: Once | ORAL | Status: AC
  Administered 2013-05-13: 650 mg via ORAL

## 2013-05-13 MED ORDER — SODIUM CHLORIDE 0.9 % IV SOLN
375.0000 mg/m2 | Freq: Once | INTRAVENOUS | Status: AC
  Administered 2013-05-13: 600 mg via INTRAVENOUS
  Filled 2013-05-13: qty 60

## 2013-05-13 MED ORDER — SODIUM CHLORIDE 0.9 % IV SOLN
Freq: Once | INTRAVENOUS | Status: AC
  Administered 2013-05-13: 11:00:00 via INTRAVENOUS

## 2013-05-13 NOTE — Progress Notes (Signed)
OFFICE PROGRESS NOTE  CC  [name], MD [address] [facility] [address] [place], [address] [place] [address]  DIAGNOSIS: 78 year old female with:  #1 left breast carcinoma originally presenting as a fungating mass. Patient underwent neoadjuvant chemotherapy followed by mastectomy. She is currently on Arimidex 1mg  daily.   #2 low-grade non-Hodgkin lymphoma status post CVP and Rituxan now receiving R-Bendamustine, followed by maintenance Rituxan.   PRIOR THERAPY:  #1 the patient was diagnosed with metastatic breast carcinoma beginning October 2009. She returned her 1 chemotherapy followed by mastectomy radiation. She was on letrozole 2.5 mg daily, but has changed to Arimidex 1mg  daily.  Also receives Xgeva every 28 days.    #2 Was diagnosed with low-grade non-Hodgkin lymphoma  12. She received 6 cycles of CVP and Rituxan, followed by maintenance Rituxan, who recurred and is now on R-Bendamustine.    #3 Pt underwent 4 cycles of R-Bendamustin PET/CT on 1/10 showed CR to therapy, will complete 2 cycles of treatment and the proceed to maintenance Rituxan.  #4 Maintenance Rituximab every 3 weeks started on 01/28/13.  CURRENT THERAPY:  Arimidex daily, Xgeva, and Maintenance Rituximab  INTERVAL HISTORY: Helen [name] 78 y.o. female returns for evaluation today prior to her maintenance rituximab.  She continues to have right mandibular pain in the area of broken teeth.  She also has a reappearance of the rash she had back in February, on her inner arms bilaterally in the antecubital area.  She denies fevers, chills, nausea, vomiting, hot flashes, dryness, joint aches, night sweats, unintentional weight loss or any further concerns.  A 10 point ROS is otherwise negative.    MEDICAL HISTORY: Past Medical History  Diagnosis Date  . Arthritis   . Blood transfusion 2009  . Breast CA 08/2008    (LT) breast ca dx 10/09/Chemo  . Lymphoma 09/09/2011    NHL  . Leukemia, acute, in remission 2012   Pt. not sure of type  . Hypertension   . Seizures 1980's    from medication reaction/Pt.  [name] Hx of radiation therapy 09/11/08 -10/31/08    left breast  . Metastasis to bone 07/03/2012    MRI L spine  . History of radiation therapy eot 07/30/12    lumbar spine L4 /l shoulder    ALLERGIES:  is allergic to codeine; penicillins; sulfonamide derivatives; tape; and tramadol hcl.  MEDICATIONS:  Current Outpatient Prescriptions  Medication Sig Dispense Refill  . anastrozole (ARIMIDEX) 1 MG tablet Take 1 tablet (1 mg total) by mouth daily.  30 tablet  4  . calcium-vitamin D (OSCAL WITH D) 500-200 MG-UNIT per tablet Take 2 tablets by mouth daily.      [name] oxycodone (OXY-IR) 5 MG capsule Take 1 capsule (5 mg total) by mouth every 4 (four) hours as needed.  90 capsule  0   No current facility-administered medications for this visit.    SURGICAL HISTORY:  Past Surgical History  Procedure Laterality Date  . Mastectomy    . Thyroidectomy  1965  . Appendectomy    . Tonsillectomy      REVIEW OF SYSTEMS:  General: fatigue (-), night sweats (-), fever (-), pain (+) Lymph: palpable nodes (-) HEENT: vision changes (-), mucositis (-), gum bleeding (-), epistaxis (-) Cardiovascular: chest pain (-), palpitations (-) Pulmonary: shortness of breath (-), dyspnea on exertion (-), cough (-), hemoptysis (-) GI:  Early satiety (-), melena (-), dysphagia (-), nausea/vomiting (-), diarrhea (-) GU: dysuria (-), hematuria (-), incontinence (-)  Musculoskeletal: joint swelling (-), joint pain (+), back pain (-) Neuro: weakness (-), numbness (-), headache (-), confusion (-) Skin: Rash (-), lesions (-), dryness (-) Psych: depression (-), suicidal/homicidal ideation (-), feeling of hopelessness (-)   PHYSICAL EXAMINATION:  BP 143/7  Pulse 70  Temp(Src) 98.4 F (36.9 C) (Oral)  Resp 20  Ht 5\' 2"  (1.575 m)  Wt 136 lb 1.6 oz (61.735 kg)  BMI 24.89 kg/m2 General: Patient is a well appearing female in no acute  distress HEENT: PERRLA, sclerae anicteric no conjunctival pallor, MMM, no facial swelling, tenderness, right 3rd molar appears broken, and 4th molar is broken now as well Neck: supple, no palpable adenopathy Lungs: clear to auscultation bilaterally, no wheezes, rhonchi, or rales Cardiovascular: regular rate rhythm, S1, S2, no murmurs, rubs or gallops Abdomen: Soft, mild epigastric tenderness, non-distended, normoactive bowel sounds, no HSM Extremities: warm and well perfused, no clubbing, cyanosis, or edema Skin: No rash or lesion   Neuro: Non-focal ECOG PERFORMANCE STATUS: 1 - Symptomatic but completely ambulatory  LABORATORY DATA: Lab Results  Component Value Date   WBC 2.3* 05/13/2013   HGB 10.6* 05/13/2013   HCT 33.1* 05/13/2013   MCV 86.6 05/13/2013   PLT 168 05/13/2013      Chemistry      Component Value Date/Time   NA 139 04/22/2013 0922   NA 136 07/06/2012 1122   NA 142 06/01/2012 1008   K 4.0 04/22/2013 0922   K 3.8 07/06/2012 1122   K 3.8 06/01/2012 1008   CL 107 04/22/2013 0922   CL 102 07/06/2012 1122   CL 103 06/01/2012 1008   CO2 23 04/22/2013 0922   CO2 20 07/06/2012 1122   CO2 25 06/01/2012 1008   BUN 10.3 04/22/2013 0922   BUN 8 07/06/2012 1122   BUN 9 06/01/2012 1008   CREATININE 0.7 04/22/2013 0922   CREATININE 0.60 07/06/2012 1122   CREATININE 0.9 06/01/2012 1008      Component Value Date/Time   CALCIUM 9.8 04/22/2013 0922   CALCIUM 9.1 07/06/2012 1122   CALCIUM 9.8 06/01/2012 1008   ALKPHOS 66 04/22/2013 0922   ALKPHOS 69 02/10/2012 1035   ALKPHOS 78 08/14/2010 1251   AST 12 04/22/2013 0922   AST 20 02/10/2012 1035   AST 19 08/14/2010 1251   ALT 8 04/22/2013 0922   ALT 19 02/10/2012 1035   BILITOT 0.25 04/22/2013 0922   BILITOT 0.4 02/10/2012 1035   BILITOT 0.40 08/14/2010 1251       RADIOGRAPHIC STUDIES: NUCLEAR MEDICINE PET CT SKULL BASE TO THIGH  Technique: Technique: 18.8 mCi F-18 FDG was injected  intravenously. CT data was obtained and used for attenuation  correction  and anatomic localization only. (This was not acquired  as a diagnostic CT examination.) Additional exam technical data  entered on technologist worksheet.  Comparison: PET of 02/07/2011. CTs of 06/01/2012.  Findings: Neck: Hypermetabolism within the left side of the  mandible, with suspicion of concurrent periapical focal lucency on  image 24.  Bilateral cervical hypermetabolic lymph nodes. Hypermetabolism  which is primarily felt to be nodal surrounds the right jugular  vein and extends into the thoracic inlet. This measures a S.U.V.  max of 9.9 on image 57  Chest: Hypermetabolic right axillary node. This measures 1.7 cm  and a S.U.V. max of 6.1 on image 82. On the prior PET, this node  measured 1.7 cm and a S.U.V. max of 3.1.  Small prevascular nodes which are hypermetabolic, including on  image 72.  Abdomen/Pelvis: Right adrenal hypermetabolism which is without CT  correlate. Hypermetabolic left external iliac adenopathy. This  measures 1.3 cm and a S.U.V. max of 5.0 on image 181. On the prior  PET, this node measured 1.6 cm and on the order of a S.U.V. max of  2.6.  Skelton: Multifocal marrow hypermetabolism consistent with osseous  involvement. Index lesion in the right humeral head measures a  S.U.V. max of 14.4 on image 53 and is new.  A hypermetabolic lytic lesion within the left iliac wing is new or  progressive and measures a S.U.V. max of 8.1 on image 155.  CT images performed for attenuation correction demonstrate  increased number and size of lymph nodes within the neck. Index  right jugulodigastric node measures 9 mm short axis on image 34  versus 6 mm on the prior PET. Chest, abdomen, and pelvic findings  deferred to recent diagnostic CTs. Trace right-sided pleural fluid  is new. Left upper lobe radiation fibrosis. Punctate left renal  calculi. Cholelithiasis. Fibroid uterus. L4 osseous metastasis  has possible epidural component on image 144 transverse.  IMPRESSION:   1. Increasing hypermetabolism associated with adenopathy within  the neck, chest, and pelvis, as detailed above. Findings are most  likely indicative of progression of lymphoma.  2. Progressive osseous metastasis, as detailed above. An L4 lesion  has possible epidural component. Consider pre and post contrast  lumbar spine MRI.  3. Hypermetabolism at the right lower neck and thoracic inlet  surrounds the right internal jugular vein. This appears somewhat  hyperattenuating. Cannot exclude thrombus. Consider right upper  extremity venous ultrasound with attention to this area.  4. Likely dental inflammatory left mandibular hypermetabolism.  Consider physical exam correlation.   ASSESSMENT: 78 year old female with  #1Patient with history of low-grade non-Hodgkin lymphoma she initially received CVP Rituxan and then subsequently on maintenance Rituxan, now on R Bendamustine.  She had a PET/CT on 1/10 which demonstrated response to her therapy. She will receive a total of 6 cycles R Bendamustine and the began maintenance Rituxan on 3/28.  #2 metastatic breast carcinoma originally diagnosed in 2009 originally on letrozole 2.5 mg daily, now on Arimidex.  #3 L4 bone metastasis which is painful. Patient is now status post palliative radiation therapy.  Also receiving Xgeva q28 days (currently on hold).    #4 Eczema   PLAN:  #1 Helen [name] is doing well today. She will proceed with Rituxan today.  I referred her to Dr. Kristin Bruins to evaluate her and rule out osteonecrosis before receiving any further xgeva therapy, however they recommended she see her dentist.  I spoke to her about this, and she doesn't have a dentist, but will find one and make an appointment.    #2 She is taking her Arimidex and tolerating it well.    #3 We will see her back in 3 weeks for her next dose.    #4 I prescribed a steroid taper for Helen [name]'s rash.  She will also apply Hydrocortisone cream BID.  I printed  out these instructions in her AVS.  Her rash resolved with the same therapy previously.    All questions were answered. The patient knows to call the clinic with any problems, questions or concerns. We can certainly see the patient much sooner if necessary.  I spent 25 minutes counseling the patient face to face. The total time spent in the appointment was 30 minutes.  Cherie Ouch Lyn Hollingshead, NP Medical Oncology [facility]  Phone: [phone]

## 2013-05-13 NOTE — Patient Instructions (Addendum)
[facility] Discharge Instructions for Patients Receiving Chemotherapy  Today you received the following chemotherapy agents: Rituxan.  To help prevent nausea and vomiting after your treatment, we encourage you to take your nausea medication as needed.   If you develop nausea and vomiting that is not controlled by your nausea medication, call the clinic.   BELOW ARE SYMPTOMS THAT SHOULD BE REPORTED IMMEDIATELY:  *FEVER GREATER THAN 100.5 F  *CHILLS WITH OR WITHOUT FEVER  NAUSEA AND VOMITING THAT IS NOT CONTROLLED WITH YOUR NAUSEA MEDICATION  *UNUSUAL SHORTNESS OF BREATH  *UNUSUAL BRUISING OR BLEEDING  TENDERNESS IN MOUTH AND THROAT WITH OR WITHOUT PRESENCE OF ULCERS  *URINARY PROBLEMS  *BOWEL PROBLEMS  UNUSUAL RASH Items with * indicate a potential emergency and should be followed up as soon as possible.  Feel free to call the clinic should you have any questions or concerns. The clinic phone number is [phone].

## 2013-05-13 NOTE — Patient Instructions (Addendum)
Doing well.  Proceed with Rituximab.  We will continue to hold Xgeva until you are evaluated by the dentist.  I prescribed steroids for you to take for the rash, also apply hydrocortisone cream twice a day.  Please call us if you have any questions or concerns.    Rituximab injection What is this medicine? RITUXIMAB (ri TUX i mab) is a monoclonal antibody. This medicine changes the way the body's immune system works. It is used commonly to treat non-Hodgkin's lymphoma and other conditions. In cancer cells, this drug targets a specific protein within cancer cells and stops the cancer cells from growing. It is also used to treat rhuematoid arthritis (RA). In RA, this medicine slow the inflammatory process and help reduce joint pain and swelling. This medicine is often used with other cancer or arthritis medications. This medicine may be used for other purposes; ask your health care provider or pharmacist if you have questions. What should I tell my health care provider before I take this medicine? They need to know if you have any of these conditions: -blood disorders -heart disease -history of hepatitis B -infection (especially a virus infection such as chickenpox, cold sores, or herpes) -irregular heartbeat -kidney disease -lung or breathing disease, like asthma -lupus -an unusual or allergic reaction to rituximab, mouse proteins, other medicines, foods, dyes, or preservatives -pregnant or trying to get pregnant -breast-feeding How should I use this medicine? This medicine is for infusion into a vein. It is administered in a hospital or clinic by a specially trained health care professional. A special MedGuide will be given to you by the pharmacist with each prescription and refill. Be sure to read this information carefully each time. Talk to your pediatrician regarding the use of this medicine in children. This medicine is not approved for use in children. Overdosage: If you think you have  taken too much of this medicine contact a poison control center or emergency room at once. NOTE: This medicine is only for you. Do not share this medicine with others. What if I miss a dose? It is important not to miss a dose. Call your doctor or health care professional if you are unable to keep an appointment. What may interact with this medicine? -cisplatin -medicines for blood pressure -some other medicines for arthritis -vaccines This list may not describe all possible interactions. Give your health care provider a list of all the medicines, herbs, non-prescription drugs, or dietary supplements you use. Also tell them if you smoke, drink alcohol, or use illegal drugs. Some items may interact with your medicine. What should I watch for while using this medicine? Report any side effects that you notice during your treatment right away, such as changes in your breathing, fever, chills, dizziness or lightheadedness. These effects are more common with the first dose. Visit your prescriber or health care professional for checks on your progress. You will need to have regular blood work. Report any other side effects. The side effects of this medicine can continue after you finish your treatment. Continue your course of treatment even though you feel ill unless your doctor tells you to stop. Call your doctor or health care professional for advice if you get a fever, chills or sore throat, or other symptoms of a cold or flu. Do not treat yourself. This drug decreases your body's ability to fight infections. Try to avoid being around people who are sick. This medicine may increase your risk to bruise or bleed. Call your doctor or  health care professional if you notice any unusual bleeding. Be careful brushing and flossing your teeth or using a toothpick because you may get an infection or bleed more easily. If you have any dental work done, tell your dentist you are receiving this medicine. Avoid taking  products that contain aspirin, acetaminophen, ibuprofen, naproxen, or ketoprofen unless instructed by your doctor. These medicines may hide a fever. Do not become pregnant while taking this medicine. Women should inform their doctor if they wish to become pregnant or think they might be pregnant. There is a potential for serious side effects to an unborn child. Talk to your health care professional or pharmacist for more information. Do not breast-feed an infant while taking this medicine. What side effects may I notice from receiving this medicine? Side effects that you should report to your doctor or health care professional as soon as possible: -allergic reactions like skin rash, itching or hives, swelling of the face, lips, or tongue -low blood counts - this medicine may decrease the number of white blood cells, red blood cells and platelets. You may be at increased risk for infections and bleeding. -signs of infection - fever or chills, cough, sore throat, pain or difficulty passing urine -signs of decreased platelets or bleeding - bruising, pinpoint red spots on the skin, black, tarry stools, blood in the urine -signs of decreased red blood cells - unusually weak or tired, fainting spells, lightheadedness -breathing problems -confused, not responsive -chest pain -fast, irregular heartbeat -feeling faint or lightheaded, falls -mouth sores -redness, blistering, peeling or loosening of the skin, including inside the mouth -stomach pain -swelling of the ankles, feet, or hands -trouble passing urine or change in the amount of urine Side effects that usually do not require medical attention (report to your doctor or other health care professional if they continue or are bothersome): -anxiety -headache -loss of appetite -muscle aches -nausea -night sweats This list may not describe all possible side effects. Call your doctor for medical advice about side effects. You may report side effects  to FDA at 1-800-FDA-1088. Where should I keep my medicine? This drug is given in a hospital or clinic and will not be stored at home. NOTE: This sheet is a summary. It may not cover all possible information. If you have questions about this medicine, talk to your doctor, pharmacist, or health care provider.  2012, [employer]. (06/19/2008 2:04:59 PM)

## 2013-05-27 ENCOUNTER — Ambulatory Visit: Payer: Medicare Other

## 2013-06-03 ENCOUNTER — Other Ambulatory Visit (HOSPITAL_BASED_OUTPATIENT_CLINIC_OR_DEPARTMENT_OTHER): Payer: Medicare Other | Admitting: Lab

## 2013-06-03 ENCOUNTER — Ambulatory Visit: Payer: Medicare Other

## 2013-06-03 ENCOUNTER — Encounter: Payer: Self-pay | Admitting: Oncology

## 2013-06-03 ENCOUNTER — Ambulatory Visit (HOSPITAL_BASED_OUTPATIENT_CLINIC_OR_DEPARTMENT_OTHER): Payer: Medicare Other | Admitting: Oncology

## 2013-06-03 VITALS — BP 129/75 | HR 67 | Temp 98.0°F | Resp 20 | Ht 62.0 in | Wt 133.2 lb

## 2013-06-03 DIAGNOSIS — Z853 Personal history of malignant neoplasm of breast: Secondary | ICD-10-CM

## 2013-06-03 DIAGNOSIS — C8589 Other specified types of non-Hodgkin lymphoma, extranodal and solid organ sites: Secondary | ICD-10-CM

## 2013-06-03 DIAGNOSIS — C7951 Secondary malignant neoplasm of bone: Secondary | ICD-10-CM

## 2013-06-03 DIAGNOSIS — C50919 Malignant neoplasm of unspecified site of unspecified female breast: Secondary | ICD-10-CM

## 2013-06-03 DIAGNOSIS — D709 Neutropenia, unspecified: Secondary | ICD-10-CM

## 2013-06-03 DIAGNOSIS — C8590 Non-Hodgkin lymphoma, unspecified, unspecified site: Secondary | ICD-10-CM

## 2013-06-03 DIAGNOSIS — C7952 Secondary malignant neoplasm of bone marrow: Secondary | ICD-10-CM

## 2013-06-03 LAB — CBC WITH DIFFERENTIAL/PLATELET
Basophils Absolute: 0 10*3/uL (ref 0.0–0.1)
EOS%: 12.6 % — ABNORMAL HIGH (ref 0.0–7.0)
HGB: 11.5 g/dL — ABNORMAL LOW (ref 11.6–15.9)
MCH: 27.6 pg (ref 25.1–34.0)
MCV: 85.9 fL (ref 79.5–101.0)
MONO%: 17.8 % — ABNORMAL HIGH (ref 0.0–14.0)
NEUT%: 51.2 % (ref 38.4–76.8)
RDW: 14 % (ref 11.2–14.5)

## 2013-06-03 MED ORDER — CIPROFLOXACIN HCL 500 MG PO TABS: 500.0000 mg | ORAL_TABLET | Freq: Two times a day (BID) | ORAL | Status: DC

## 2013-06-03 MED ORDER — OXYCODONE HCL 5 MG PO CAPS: 5.0000 mg | ORAL_CAPSULE | ORAL | Status: DC | PRN

## 2013-06-03 NOTE — Progress Notes (Signed)
OFFICE PROGRESS NOTE  CC  [name], MD [address] [facility] [address] [place], [address] [place] [address]  DIAGNOSIS: 78 year old female with:  #1 left breast carcinoma originally presenting as a fungating mass. Patient underwent neoadjuvant chemotherapy followed by mastectomy. She is currently on Arimidex 1mg  daily.   #2 low-grade non-Hodgkin lymphoma status post CVP and Rituxan now receiving R-Bendamustine, followed by maintenance Rituxan.   PRIOR THERAPY:  #1 the patient was diagnosed with metastatic breast carcinoma beginning October 2009. She returned her 1 chemotherapy followed by mastectomy radiation. She was on letrozole 2.5 mg daily, but has changed to Arimidex 1mg  daily.  Also receives Xgeva every 28 days.    #2 Was diagnosed with low-grade non-Hodgkin lymphoma  12. She received 6 cycles of CVP and Rituxan, followed by maintenance Rituxan, who recurred and is now on R-Bendamustine.    #3 Pt underwent 4 cycles of R-Bendamustin PET/CT on 1/10 showed CR to therapy, will complete 2 cycles of treatment and the proceed to maintenance Rituxan.  #4 Maintenance Rituximab every 3 weeks started on 01/28/13.  CURRENT THERAPY:  Arimidex daily, Xgeva, and Maintenance Rituximab  INTERVAL HISTORY: Helen [name] 78 y.o. female returns for evaluation today prior to her maintenance rituximab.  She overall seems to be doing well. However she is neutropenic. She denies having any fevers chills or night sweats. She has no nausea vomiting. She continues to complain of her chronic aches and pains. She is on Arimidex. She is tolerating it well. She will be due for her xgeva on August 8. Today we will not give her rituximab since she is neutropenic. I have explained this to her. She will be put on oral antibiotics. She will take Cipro 500 mg twice a day for 7 days. Prescription has been sent to her pharmacy. I did explain to her neutropenic precautions.  A 10 point ROS is otherwise negative.     MEDICAL HISTORY: Past Medical History  Diagnosis Date  . Arthritis   . Blood transfusion 2009  . Breast CA 08/2008    (LT) breast ca dx 10/09/Chemo  . Lymphoma 09/09/2011    NHL  . Leukemia, acute, in remission 2012    Pt. not sure of type  . Hypertension   . Seizures 1980's    from medication reaction/Pt.  [name] Hx of radiation therapy 09/11/08 -10/31/08    left breast  . Metastasis to bone 07/03/2012    MRI L spine  . History of radiation therapy eot 07/30/12    lumbar spine L4 /l shoulder    ALLERGIES:  is allergic to codeine; penicillins; sulfonamide derivatives; tape; and tramadol hcl.  MEDICATIONS:  Current Outpatient Prescriptions  Medication Sig Dispense Refill  . anastrozole (ARIMIDEX) 1 MG tablet Take 1 tablet (1 mg total) by mouth daily.  30 tablet  4  . calcium-vitamin D (OSCAL WITH D) 500-200 MG-UNIT per tablet Take 2 tablets by mouth daily.      [name] oxycodone (OXY-IR) 5 MG capsule Take 1 capsule (5 mg total) by mouth every 4 (four) hours as needed.  90 capsule  0  . predniSONE (STERAPRED UNI-PAK) 10 MG tablet Take 1 tablet (10 mg total) by mouth as directed. 60mg  po on day 1,50mg  po on day 2,40mg  on day 3,30mg  on day 4,20mg  on day 5,10mg  on day 6,  21 tablet  0   No current facility-administered medications for this visit.    SURGICAL HISTORY:  Past Surgical History  Procedure  Laterality Date  . Mastectomy    . Thyroidectomy  1965  . Appendectomy    . Tonsillectomy      REVIEW OF SYSTEMS:  General: fatigue (-), night sweats (-), fever (-), pain (+) Lymph: palpable nodes (-) HEENT: vision changes (-), mucositis (-), gum bleeding (-), epistaxis (-) Cardiovascular: chest pain (-), palpitations (-) Pulmonary: shortness of breath (-), dyspnea on exertion (-), cough (-), hemoptysis (-) GI:  Early satiety (-), melena (-), dysphagia (-), nausea/vomiting (-), diarrhea (-) GU: dysuria (-), hematuria (-), incontinence (-) Musculoskeletal: joint swelling (-), joint pain  (+), back pain (-) Neuro: weakness (-), numbness (-), headache (-), confusion (-) Skin: Rash (-), lesions (-), dryness (-) Psych: depression (-), suicidal/homicidal ideation (-), feeling of hopelessness (-)   PHYSICAL EXAMINATION:  BP 129/75  Pulse 67  Temp(Src) 98 F (36.7 C) (Oral)  Resp 20  Ht 5\' 2"  (1.575 m)  Wt 133 lb 3.2 oz (60.419 kg)  BMI 24.36 kg/m2 General: Patient is a well appearing female in no acute distress HEENT: PERRLA, sclerae anicteric no conjunctival pallor, MMM, no facial swelling, tenderness, right 3rd molar appears broken, and 4th molar is broken now as well Neck: supple, no palpable adenopathy Lungs: clear to auscultation bilaterally, no wheezes, rhonchi, or rales Cardiovascular: regular rate rhythm, S1, S2, no murmurs, rubs or gallops Abdomen: Soft, mild epigastric tenderness, non-distended, normoactive bowel sounds, no HSM Extremities: warm and well perfused, no clubbing, cyanosis, or edema Skin: No rash or lesion   Neuro: Non-focal ECOG PERFORMANCE STATUS: 1 - Symptomatic but completely ambulatory  LABORATORY DATA: Lab Results  Component Value Date   WBC 1.9* 06/03/2013   HGB 11.5* 06/03/2013   HCT 35.8 06/03/2013   MCV 85.9 06/03/2013   PLT 235 06/03/2013      Chemistry      Component Value Date/Time   NA 141 05/13/2013 0944   NA 136 07/06/2012 1122   NA 142 06/01/2012 1008   K 3.4* 05/13/2013 0944   K 3.8 07/06/2012 1122   K 3.8 06/01/2012 1008   CL 107 04/22/2013 0922   CL 102 07/06/2012 1122   CL 103 06/01/2012 1008   CO2 27 05/13/2013 0944   CO2 20 07/06/2012 1122   CO2 25 06/01/2012 1008   BUN 7.7 05/13/2013 0944   BUN 8 07/06/2012 1122   BUN 9 06/01/2012 1008   CREATININE 0.7 05/13/2013 0944   CREATININE 0.60 07/06/2012 1122   CREATININE 0.9 06/01/2012 1008      Component Value Date/Time   CALCIUM 9.7 05/13/2013 0944   CALCIUM 9.1 07/06/2012 1122   CALCIUM 9.8 06/01/2012 1008   ALKPHOS 62 05/13/2013 0944   ALKPHOS 69 02/10/2012 1035   ALKPHOS 78 08/14/2010  1251   AST 15 05/13/2013 0944   AST 20 02/10/2012 1035   AST 19 08/14/2010 1251   ALT 13 05/13/2013 0944   ALT 19 02/10/2012 1035   ALT 9* 08/14/2010 1251   BILITOT 0.20 05/13/2013 0944   BILITOT 0.4 02/10/2012 1035   BILITOT 0.40 08/14/2010 1251       RADIOGRAPHIC STUDIES: NUCLEAR MEDICINE PET CT SKULL BASE TO THIGH  Technique: Technique: 18.8 mCi F-18 FDG was injected  intravenously. CT data was obtained and used for attenuation  correction and anatomic localization only. (This was not acquired  as a diagnostic CT examination.) Additional exam technical data  entered on technologist worksheet.  Comparison: PET of 02/07/2011. CTs of 06/01/2012.  Findings: Neck: Hypermetabolism within the left side  of the  mandible, with suspicion of concurrent periapical focal lucency on  image 24.  Bilateral cervical hypermetabolic lymph nodes. Hypermetabolism  which is primarily felt to be nodal surrounds the right jugular  vein and extends into the thoracic inlet. This measures a S.U.V.  max of 9.9 on image 57  Chest: Hypermetabolic right axillary node. This measures 1.7 cm  and a S.U.V. max of 6.1 on image 82. On the prior PET, this node  measured 1.7 cm and a S.U.V. max of 3.1.  Small prevascular nodes which are hypermetabolic, including on  image 72.  Abdomen/Pelvis: Right adrenal hypermetabolism which is without CT  correlate. Hypermetabolic left external iliac adenopathy. This  measures 1.3 cm and a S.U.V. max of 5.0 on image 181. On the prior  PET, this node measured 1.6 cm and on the order of a S.U.V. max of  2.6.  [place]: Multifocal marrow hypermetabolism consistent with osseous  involvement. Index lesion in the right humeral head measures a  S.U.V. max of 14.4 on image 53 and is new.  A hypermetabolic lytic lesion within the left iliac wing is new or  progressive and measures a S.U.V. max of 8.1 on image 155.  CT images performed for attenuation correction demonstrate  increased  number and size of lymph nodes within the neck. Index  right jugulodigastric node measures 9 mm short axis on image 34  versus 6 mm on the prior PET. Chest, abdomen, and pelvic findings  deferred to recent diagnostic CTs. Trace right-sided pleural fluid  is new. Left upper lobe radiation fibrosis. Punctate left renal  calculi. Cholelithiasis. Fibroid uterus. L4 osseous metastasis  has possible epidural component on image 144 transverse.  IMPRESSION:  1. Increasing hypermetabolism associated with adenopathy within  the neck, chest, and pelvis, as detailed above. Findings are most  likely indicative of progression of lymphoma.  2. Progressive osseous metastasis, as detailed above. An L4 lesion  has possible epidural component. Consider pre and post contrast  lumbar spine MRI.  3. Hypermetabolism at the right lower neck and thoracic inlet  surrounds the right internal jugular vein. This appears somewhat  hyperattenuating. Cannot exclude thrombus. Consider right upper  extremity venous ultrasound with attention to this area.  4. Likely dental inflammatory left mandibular hypermetabolism.  Consider physical exam correlation.   ASSESSMENT: 78 year old female with  #1Patient with history of low-grade non-Hodgkin lymphoma she initially received CVP Rituxan and then subsequently on maintenance Rituxan, now on R Bendamustine.  She had a PET/CT on 1/10 which demonstrated response to her therapy. She will receive a total of 6 cycles R Bendamustine and the began maintenance Rituxan on 3/28.  #2 metastatic breast carcinoma originally diagnosed in 2009 originally on letrozole 2.5 mg daily, now on Arimidex.  #3 L4 bone metastasis which is painful. Patient is now status post palliative radiation therapy.  Also receiving Xgeva q28 days (currently on hold).    #4 Eczema   PLAN:  #1 patient is neutropenic today. I discussed neutropenic precautions. She was given a prescription for Cipro to take twice a  day for total of 7 days.  #2 we will hold the Rituxan infusion today.  #3 she will return on August 8 for xgeva injection. And she will be seen back on August 22 for Rituxan.  #4 patient was given prescriptions for oxycodone as well as Cipro.   All questions were answered. The patient knows to call the clinic with any problems, questions or concerns. We can certainly see  the patient much sooner if necessary.  I spent 25 minutes counseling the patient face to face. The total time spent in the appointment was 30 minutes.  Drue Second, MD Medical/Oncology [facility] [phone] (beeper) [phone] (Office)  06/03/2013, 8:54 AM

## 2013-06-03 NOTE — Patient Instructions (Addendum)
Hold rituxan today  Return on 8/8 for Xgeva injection  We will see you back on 8/22 for office visit and rituxan  Take cipro (antibiotic) starting today twice a day for 7 days to help prevent infections because of your low white blood cell count

## 2013-06-10 ENCOUNTER — Encounter: Payer: Self-pay | Admitting: Medical Oncology

## 2013-06-10 ENCOUNTER — Ambulatory Visit: Payer: Medicare Other

## 2013-06-10 ENCOUNTER — Ambulatory Visit: Payer: Medicare Other | Admitting: Oncology

## 2013-06-10 DIAGNOSIS — C7951 Secondary malignant neoplasm of bone: Secondary | ICD-10-CM

## 2013-06-10 MED ORDER — DENOSUMAB 120 MG/1.7ML ~~LOC~~ SOLN
120.0000 mg | Freq: Once | SUBCUTANEOUS | Status: DC
  Filled 2013-06-10: qty 1.7

## 2013-06-13 ENCOUNTER — Encounter (HOSPITAL_COMMUNITY): Payer: Self-pay | Admitting: Dentistry

## 2013-06-13 ENCOUNTER — Ambulatory Visit (HOSPITAL_COMMUNITY): Payer: Medicaid - Dental | Admitting: Dentistry

## 2013-06-13 VITALS — BP 113/69 | HR 69 | Temp 97.9°F

## 2013-06-13 DIAGNOSIS — K08409 Partial loss of teeth, unspecified cause, unspecified class: Secondary | ICD-10-CM

## 2013-06-13 DIAGNOSIS — K045 Chronic apical periodontitis: Secondary | ICD-10-CM

## 2013-06-13 DIAGNOSIS — C50919 Malignant neoplasm of unspecified site of unspecified female breast: Secondary | ICD-10-CM

## 2013-06-13 DIAGNOSIS — K036 Deposits [accretions] on teeth: Secondary | ICD-10-CM

## 2013-06-13 DIAGNOSIS — K083 Retained dental root: Secondary | ICD-10-CM

## 2013-06-13 DIAGNOSIS — C7952 Secondary malignant neoplasm of bone marrow: Secondary | ICD-10-CM

## 2013-06-13 DIAGNOSIS — K0530 Chronic periodontitis, unspecified: Secondary | ICD-10-CM

## 2013-06-13 DIAGNOSIS — K029 Dental caries, unspecified: Secondary | ICD-10-CM

## 2013-06-13 DIAGNOSIS — M264 Malocclusion, unspecified: Secondary | ICD-10-CM

## 2013-06-13 NOTE — Progress Notes (Signed)
DENTAL CONSULTATION  Date of Consultation:  06/13/2013 Patient Name:   Helen [name] Date of Birth:   [birth year] Medical Record Number: [id number]  VITALS: BP 113/69  Pulse 69  Temp(Src) 97.9 F (36.6 C) (Oral)   CHIEF COMPLAINT: The patient was referred by Dr. Welton Flakes for evaluation of poor dentition with a history of previous Xgeva therapy.  HPI: Helen [name] is a 78 year old female with a history of left breast cancer in 2009. The patient was treated with chemoradiation therapy at that time. Patient also with a history of bony metastasis involving the left spine treated with radiation therapy in September 2013.  The patient has had multiple doses of Xgeva therapy since that time. The patient now presents for dental consultation to evaluate poor dentition and to discuss the risk for osteonecrosis of the jaw related to previous Xgeva therapy.  The patient currently denies acute toothache, swellings, or abscesses. The patient indicates that she has several teeth that are" broke out".  Patient indicates that she has not seen a dentist since approximately 1980.  This was to have a tooth pulled in [place], [place]. Patient denies having any complications from that dental extraction. The patient has no partial dentures. The patient has no regular primary dentist.  PMH: Past Medical History  Diagnosis Date  . Arthritis   . Blood transfusion 2009  . Breast CA 08/2008    (LT) breast ca dx 10/09/Chemo  . Lymphoma 09/09/2011    NHL  . Leukemia, acute, in remission 2012    Pt. not sure of type  . Hypertension   . Seizures 1980's    from medication reaction/Pt.  [name] Hx of radiation therapy 09/11/08 -10/31/08    left breast  . Metastasis to bone 07/03/2012    MRI L spine  . History of radiation therapy eot 07/30/12    lumbar spine L4 /l shoulder    PSH: Past Surgical History  Procedure Laterality Date  . Mastectomy    . Thyroidectomy  1965  . Appendectomy    . Tonsillectomy       ALLERGIES: Allergies  Allergen Reactions  . Codeine Other (See Comments)    Reaction: seizures  . Penicillins Other (See Comments)    Reaction: Seizures  . Sulfonamide Derivatives Itching  . Tape Rash  . Tramadol Hcl Rash    MEDICATIONS: Current Outpatient Prescriptions  Medication Sig Dispense Refill  . anastrozole (ARIMIDEX) 1 MG tablet Take 1 tablet (1 mg total) by mouth daily.  30 tablet  4  . calcium-vitamin D (OSCAL WITH D) 500-200 MG-UNIT per tablet Take 2 tablets by mouth daily.      . ciprofloxacin (CIPRO) 500 MG tablet Take 1 tablet (500 mg total) by mouth 2 (two) times daily.  14 tablet  0  . oxycodone (OXY-IR) 5 MG capsule Take 1 capsule (5 mg total) by mouth every 4 (four) hours as needed.  90 capsule  0   No current facility-administered medications for this visit.    LABS: Lab Results  Component Value Date   WBC 1.9* 06/03/2013   HGB 11.5* 06/03/2013   HCT 35.8 06/03/2013   MCV 85.9 06/03/2013   PLT 235 06/03/2013      Component Value Date/Time   NA 141 05/13/2013 0944   NA 136 07/06/2012 1122   NA 142 06/01/2012 1008   K 3.4* 05/13/2013 0944   K 3.8 07/06/2012 1122   K 3.8 06/01/2012 1008   CL 107 04/22/2013 0922   CL  102 07/06/2012 1122   CL 103 06/01/2012 1008   CO2 27 05/13/2013 0944   CO2 20 07/06/2012 1122   CO2 25 06/01/2012 1008   GLUCOSE 110 05/13/2013 0944   GLUCOSE 89 04/22/2013 0922   GLUCOSE 182* 07/06/2012 1122   GLUCOSE 108 06/01/2012 1008   BUN 7.7 05/13/2013 0944   BUN 8 07/06/2012 1122   BUN 9 06/01/2012 1008   CREATININE 0.7 05/13/2013 0944   CREATININE 0.60 07/06/2012 1122   CREATININE 0.9 06/01/2012 1008   CALCIUM 9.7 05/13/2013 0944   CALCIUM 9.1 07/06/2012 1122   CALCIUM 9.8 06/01/2012 1008   GFRNONAA 87* 07/06/2012 1122   GFRAA >90 07/06/2012 1122   Lab Results  Component Value Date   INR 0.97 03/11/2011   INR 1.1 08/29/2008   No results found for this basename: PTT    SOCIAL HISTORY: History   Social History  . Marital Status: Widowed    Spouse  Name: N/A    Number of Children: N/A  . Years of Education: N/A   Occupational History  . Not on file.   Social History Main Topics  . Smoking status: Never Smoker   . Smokeless tobacco: Never Used  . Alcohol Use: No  . Drug Use: No  . Sexually Active: Not Currently   Other Topics Concern  . Not on file   Social History Narrative  . No narrative on file    FAMILY HISTORY: Family History  Problem Relation Age of Onset  . Alcohol abuse Other   . Drug abuse Other   . Breast cancer Mother   . Prostate cancer Father   . Cancer Father     colon  . Colon cancer Brother   . Esophageal cancer Neg Hx   . Rectal cancer Neg Hx   . Stomach cancer Neg Hx      REVIEW OF SYSTEMS: Reviewed with patient and included in dental record.  DENTAL HISTORY: CHIEF COMPLAINT: The patient was referred by Dr. Welton Flakes for evaluation of poor dentition with a history of previous Xgeva therapy.  HPI: Helen [name] is a 78 year old female with a history of left breast cancer in 2009. The patient was treated with chemoradiation therapy at that time. Patient also with a history of bony metastasis involving the left spine treated with radiation therapy in September 2013.  The patient has had multiple doses of Xgeva therapy since that time. The patient now presents for dental consultation to evaluate poor dentition and to discuss the risk for osteonecrosis of the jaw related to previous Xgeva therapy.  The patient currently denies acute toothache, swellings, or abscesses. The patient indicates that she has several teeth that are" broke out".  Patient indicates that she has not seen a dentist since approximately 1980.  This was to have a tooth pulled in [place], [place]. Patient denies having any complications from that dental extraction. The patient has no partial dentures. The patient has no regular primary dentist.  DENTAL EXAMINATION:  GENERAL: The patient is a well-developed,  well-nourished female in no acute distress. HEAD AND NECK: There is no palpable submandibular lymphadenopathy. The patient denies acute TMJ symptoms. INTRAORAL EXAM: The patient has normal saliva. There is no evidence of oral abscess formation. DENTITION: The patient is missing tooth numbers 19 and 32. Patient also has a supernumerary premolar tooth in the area of tooth numbers 20 and 21. P PERIODONTAL: The patient has chronic periodontitis with plaque and calculus accumulations, generalized gingival recession, and incipient  mandibular anterior tooth mobility  DENTAL CARIES/SUBOPTIMAL RESTORATIONS: There are multiple dental caries noted as per dental charting form. ENDODONTIC: The patient currently denies acute pulpitis symptoms. The patient does have multiple areas of critical radiolucency associated with tooth numbers 14 and 30. CROWN AND BRIDGE: There are no crown or bridge restorations. PROSTHODONTIC: There are no partial dentures. OCCLUSION: There is a poor occlusal scheme secondary to multiple missing teeth, supra-eruption and drifting of the unopposed teeth into the edentulous areas, anterior open bite, and lack of replacement of missing teeth with dental prostheses.  RADIOGRAPHIC INTERPRETATION: An orthopantogram was taken and supplemented with a full series of dental radiographs. There are missing tooth numbers 19 and 32. There are retained roots in the area of tooth numbers 14 and 30. There are multiple dental caries noted, some of which are extensive. There is incipient to moderate bone loss.  There is supra-eruption and drifting of the unopposed teeth into the edentulous areas.  There is a superernumerary tooth in the area of tooth numbers 20 and 21.  ASSESSMENTS: 1. Metastatic breast cancer 2. History of Xgeva therapy with risk for osteonecrosis of the jaw with invasive dental procedures 3. Chronic apical periodontitis 4. Chronic periodontitis 5. Extensive dental caries 6. Plaque  and calculus accumulations 7. Gingival recession 8. Mandibular anterior tooth mobility 9. Anterior open bite 10. Poor occlusal scheme 11. 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 History of oral neglect  PLAN/RECOMMENDATIONS: 1. I discussed the risks, benefits, and complications of various treatment options with the patient in relationship to her medical and dental conditions, history of Xgeva therapy, and risk for osteonecrosis of the jaw with invasive dental procedures. We discussed various treatment options to include no treatment, multiple extractions with alveoloplasty, pre-prosthetic surgery as indicated, periodontal therapy, dental restorations, root canal therapy, crown and bridge therapy, implant therapy, and replacement of missing teeth as indicated. The patient currently wishes to proceed with  referral to Dr. Lincoln Brigham (an oral surgeon) for evaluation for extraction of tooth numbers 3, 14, 15, ?16, supernumerary premolar tooth 20', and #30.  The patient will then followup for initial periodontal therapy and restorations with subsequent referral to a primary dentist for periodontal maintenance and evaluation for replacement of missing teeth.  2. Discussion of findings with medical team and coordination of future medical and dental care as needed. Currently, the Xgeva therapy will continue to be withheld with the last dose having been given on 03/18/2013. Chart review indicates that 8 previous doses were administered from 08/20/2012 through 03/18/2013.    Charlynne Pander, DDS

## 2013-06-13 NOTE — Patient Instructions (Addendum)
The patient was referred to an oral surgeon (Dr. Lincoln Brigham) for evaluation for multiple extractions. Dr. Deirdre Peer office is to contact patient and schedule him initially except for consultation dated time.  The patient is to contact dental medicine with the date of the schedule treatment. The patient is to contact Dr. Drue Second or dental medicine if acute dental problems arise in the interim.   Dr. Kristin Bruins

## 2013-06-24 ENCOUNTER — Other Ambulatory Visit (HOSPITAL_BASED_OUTPATIENT_CLINIC_OR_DEPARTMENT_OTHER): Payer: Medicare Other | Admitting: Lab

## 2013-06-24 ENCOUNTER — Ambulatory Visit (HOSPITAL_BASED_OUTPATIENT_CLINIC_OR_DEPARTMENT_OTHER): Payer: Medicare Other

## 2013-06-24 ENCOUNTER — Telehealth: Payer: Self-pay | Admitting: Oncology

## 2013-06-24 ENCOUNTER — Ambulatory Visit (HOSPITAL_BASED_OUTPATIENT_CLINIC_OR_DEPARTMENT_OTHER): Payer: Medicare Other | Admitting: Adult Health

## 2013-06-24 ENCOUNTER — Ambulatory Visit: Payer: Medicare Other | Admitting: Oncology

## 2013-06-24 ENCOUNTER — Encounter: Payer: Self-pay | Admitting: Adult Health

## 2013-06-24 ENCOUNTER — Ambulatory Visit: Payer: Medicare Other

## 2013-06-24 VITALS — BP 158/80 | HR 84 | Temp 97.8°F | Resp 18 | Ht 62.0 in | Wt 135.3 lb

## 2013-06-24 VITALS — BP 124/60 | HR 73 | Temp 98.2°F | Resp 18

## 2013-06-24 DIAGNOSIS — Z5112 Encounter for antineoplastic immunotherapy: Secondary | ICD-10-CM

## 2013-06-24 DIAGNOSIS — C50919 Malignant neoplasm of unspecified site of unspecified female breast: Secondary | ICD-10-CM

## 2013-06-24 DIAGNOSIS — C7951 Secondary malignant neoplasm of bone: Secondary | ICD-10-CM

## 2013-06-24 DIAGNOSIS — C8589 Other specified types of non-Hodgkin lymphoma, extranodal and solid organ sites: Secondary | ICD-10-CM

## 2013-06-24 DIAGNOSIS — Z853 Personal history of malignant neoplasm of breast: Secondary | ICD-10-CM

## 2013-06-24 DIAGNOSIS — C8590 Non-Hodgkin lymphoma, unspecified, unspecified site: Secondary | ICD-10-CM

## 2013-06-24 DIAGNOSIS — G893 Neoplasm related pain (acute) (chronic): Secondary | ICD-10-CM

## 2013-06-24 LAB — CBC WITH DIFFERENTIAL/PLATELET
BASO%: 0.3 % (ref 0.0–2.0)
LYMPH%: 10.4 % — ABNORMAL LOW (ref 14.0–49.7)
MCHC: 31.8 g/dL (ref 31.5–36.0)
MONO#: 0.9 10*3/uL (ref 0.1–0.9)
NEUT#: 1.7 10*3/uL (ref 1.5–6.5)
Platelets: 143 10*3/uL — ABNORMAL LOW (ref 145–400)
RBC: 3.89 10*6/uL (ref 3.70–5.45)
RDW: 14.3 % (ref 11.2–14.5)
WBC: 3.1 10*3/uL — ABNORMAL LOW (ref 3.9–10.3)
lymph#: 0.3 10*3/uL — ABNORMAL LOW (ref 0.9–3.3)

## 2013-06-24 LAB — COMPREHENSIVE METABOLIC PANEL (CC13)
ALT: 11 U/L (ref 0–55)
Albumin: 3.3 g/dL — ABNORMAL LOW (ref 3.5–5.0)
CO2: 22 mEq/L (ref 22–29)
Potassium: 3.9 mEq/L (ref 3.5–5.1)
Sodium: 141 mEq/L (ref 136–145)
Total Bilirubin: 0.21 mg/dL (ref 0.20–1.20)
Total Protein: 7.1 g/dL (ref 6.4–8.3)

## 2013-06-24 MED ORDER — ACETAMINOPHEN 325 MG PO TABS
650.0000 mg | ORAL_TABLET | Freq: Once | ORAL | Status: AC
  Administered 2013-06-24: 650 mg via ORAL

## 2013-06-24 MED ORDER — SODIUM CHLORIDE 0.9 % IJ SOLN
10.0000 mL | INTRAMUSCULAR | Status: DC | PRN
  Administered 2013-06-24: 10 mL
  Filled 2013-06-24: qty 10

## 2013-06-24 MED ORDER — HEPARIN SOD (PORK) LOCK FLUSH 100 UNIT/ML IV SOLN
500.0000 [IU] | Freq: Once | INTRAVENOUS | Status: AC | PRN
  Administered 2013-06-24: 500 [IU]
  Filled 2013-06-24: qty 5

## 2013-06-24 MED ORDER — SODIUM CHLORIDE 0.9 % IV SOLN
Freq: Once | INTRAVENOUS | Status: AC
  Administered 2013-06-24: 12:00:00 via INTRAVENOUS

## 2013-06-24 MED ORDER — SODIUM CHLORIDE 0.9 % IV SOLN
375.0000 mg/m2 | Freq: Once | INTRAVENOUS | Status: AC
  Administered 2013-06-24: 600 mg via INTRAVENOUS
  Filled 2013-06-24: qty 60

## 2013-06-24 MED ORDER — DIPHENHYDRAMINE HCL 25 MG PO CAPS
50.0000 mg | ORAL_CAPSULE | Freq: Once | ORAL | Status: AC
  Administered 2013-06-24: 50 mg via ORAL

## 2013-06-24 NOTE — Patient Instructions (Addendum)
Doing well.  Proceed with Rituxan.  Please call us if you have any questions or concerns.

## 2013-06-24 NOTE — Telephone Encounter (Signed)
gv pt appt schedule for September.  °

## 2013-06-24 NOTE — Progress Notes (Signed)
OFFICE PROGRESS NOTE  CC  [name], MD [address] [facility] [address] [place] [place] [address]  DIAGNOSIS: 78 year old female with:  #1 left breast carcinoma originally presenting as a fungating mass. Patient underwent neoadjuvant chemotherapy followed by mastectomy. She is currently on Arimidex 1mg  daily.   #2 low-grade non-Hodgkin lymphoma status post CVP and Rituxan now receiving R-Bendamustine, followed by maintenance Rituxan.   PRIOR THERAPY:  #1 the patient was diagnosed with metastatic breast carcinoma beginning October 2009. She returned her 1 chemotherapy followed by mastectomy radiation. She was on letrozole 2.5 mg daily, but has changed to Arimidex 1mg  daily.  Also receives Xgeva every 28 days.    #2 Was diagnosed with low-grade non-Hodgkin lymphoma  12. She received 6 cycles of CVP and Rituxan, followed by maintenance Rituxan, who recurred and is now on R-Bendamustine.    #3 Pt underwent 4 cycles of R-Bendamustin PET/CT on 1/10 showed CR to therapy, will complete 2 cycles of treatment and the proceed to maintenance Rituxan.  #4 Maintenance Rituximab every 3 weeks started on 01/28/13.  CURRENT THERAPY:  Arimidex daily, Xgeva, and Maintenance Rituximab  INTERVAL HISTORY: Brithany Whitworth 78 y.o. female returns for evaluation today prior to her maintenance rituximab.  She is doing well today.  She has recovered from her neutropenia and is feeling well.  She denies fevers, chills, nausea, vomiting, pain, or any other concerns.  She has an appointment in two weeks for dental extractions.  Otherwise, a 10 point ROS is neg.    MEDICAL HISTORY: Past Medical History  Diagnosis Date  . Arthritis   . Blood transfusion 2009  . Breast CA 08/2008    (LT) breast ca dx 10/09/Chemo  . Lymphoma 09/09/2011    NHL  . Leukemia, acute, in remission 2012    Pt. not sure of type  . Hypertension   . Seizures 1980's    from medication reaction/Pt.  [name] Hx of radiation therapy  09/11/08 -10/31/08    left breast  . Metastasis to bone 07/03/2012    MRI L spine  . History of radiation therapy eot 07/30/12    lumbar spine L4 /l shoulder    ALLERGIES:  is allergic to codeine; penicillins; sulfonamide derivatives; tape; and tramadol hcl.  MEDICATIONS:  Current Outpatient Prescriptions  Medication Sig Dispense Refill  . anastrozole (ARIMIDEX) 1 MG tablet Take 1 tablet (1 mg total) by mouth daily.  30 tablet  4  . calcium-vitamin D (OSCAL WITH D) 500-200 MG-UNIT per tablet Take 2 tablets by mouth daily.      [name] oxycodone (OXY-IR) 5 MG capsule Take 1 capsule (5 mg total) by mouth every 4 (four) hours as needed.  90 capsule  0   No current facility-administered medications for this visit.    SURGICAL HISTORY:  Past Surgical History  Procedure Laterality Date  . Mastectomy    . Thyroidectomy  1965  . Appendectomy    . Tonsillectomy      REVIEW OF SYSTEMS:  General: fatigue (-), night sweats (-), fever (-), pain (+) Lymph: palpable nodes (-) HEENT: vision changes (-), mucositis (-), gum bleeding (-), epistaxis (-) Cardiovascular: chest pain (-), palpitations (-) Pulmonary: shortness of breath (-), dyspnea on exertion (-), cough (-), hemoptysis (-) GI:  Early satiety (-), melena (-), dysphagia (-), nausea/vomiting (-), diarrhea (-) GU: dysuria (-), hematuria (-), incontinence (-) Musculoskeletal: joint swelling (-), joint pain (+), back pain (-) Neuro: weakness (-), numbness (-), headache (-),  confusion (-) Skin: Rash (-), lesions (-), dryness (-) Psych: depression (-), suicidal/homicidal ideation (-), feeling of hopelessness (-)   PHYSICAL EXAMINATION:  BP 158/80  Pulse 84  Temp(Src) 97.8 F (36.6 C) (Oral)  Resp 18  Ht 5\' 2"  (1.575 m)  Wt 135 lb 5 oz (61.377 kg)  BMI 24.74 kg/m2 General: Patient is a well appearing female in no acute distress HEENT: PERRLA, sclerae anicteric no conjunctival pallor, MMM, no facial swelling, tenderness, right 3rd molar  appears broken, and 4th molar is broken now as well Neck: supple, no palpable adenopathy Lungs: clear to auscultation bilaterally, no wheezes, rhonchi, or rales Cardiovascular: regular rate rhythm, S1, S2, no murmurs, rubs or gallops Abdomen: Soft, mild epigastric tenderness, non-distended, normoactive bowel sounds, no HSM Extremities: warm and well perfused, no clubbing, cyanosis, or edema Skin: No rash or lesion   Neuro: Non-focal ECOG PERFORMANCE STATUS: 1 - Symptomatic but completely ambulatory  LABORATORY DATA: Lab Results  Component Value Date   WBC 3.1* 06/24/2013   HGB 10.5* 06/24/2013   HCT 33.0* 06/24/2013   MCV 84.8 06/24/2013   PLT 143* 06/24/2013      Chemistry      Component Value Date/Time   NA 141 05/13/2013 0944   NA 136 07/06/2012 1122   NA 142 06/01/2012 1008   K 3.4* 05/13/2013 0944   K 3.8 07/06/2012 1122   K 3.8 06/01/2012 1008   CL 107 04/22/2013 0922   CL 102 07/06/2012 1122   CL 103 06/01/2012 1008   CO2 27 05/13/2013 0944   CO2 20 07/06/2012 1122   CO2 25 06/01/2012 1008   BUN 7.7 05/13/2013 0944   BUN 8 07/06/2012 1122   BUN 9 06/01/2012 1008   CREATININE 0.7 05/13/2013 0944   CREATININE 0.60 07/06/2012 1122   CREATININE 0.9 06/01/2012 1008      Component Value Date/Time   CALCIUM 9.7 05/13/2013 0944   CALCIUM 9.1 07/06/2012 1122   CALCIUM 9.8 06/01/2012 1008   ALKPHOS 62 05/13/2013 0944   ALKPHOS 69 02/10/2012 1035   ALKPHOS 78 08/14/2010 1251   AST 15 05/13/2013 0944   AST 20 02/10/2012 1035   AST 19 08/14/2010 1251   ALT 13 05/13/2013 0944   ALT 19 02/10/2012 1035   ALT 9* 08/14/2010 1251   BILITOT 0.20 05/13/2013 0944   BILITOT 0.4 02/10/2012 1035   BILITOT 0.40 08/14/2010 1251       RADIOGRAPHIC STUDIES: NUCLEAR MEDICINE PET CT SKULL BASE TO THIGH  Technique: Technique: 18.8 mCi F-18 FDG was injected  intravenously. CT data was obtained and used for attenuation  correction and anatomic localization only. (This was not acquired  as a diagnostic CT examination.)  Additional exam technical data  entered on technologist worksheet.  Comparison: PET of 02/07/2011. CTs of 06/01/2012.  Findings: Neck: Hypermetabolism within the left side of the  mandible, with suspicion of concurrent periapical focal lucency on  image 24.  Bilateral cervical hypermetabolic lymph nodes. Hypermetabolism  which is primarily felt to be nodal surrounds the right jugular  vein and extends into the thoracic inlet. This measures a S.U.V.  max of 9.9 on image 57  Chest: Hypermetabolic right axillary node. This measures 1.7 cm  and a S.U.V. max of 6.1 on image 82. On the prior PET, this node  measured 1.7 cm and a S.U.V. max of 3.1.  Small prevascular nodes which are hypermetabolic, including on  image 72.  Abdomen/Pelvis: Right adrenal hypermetabolism which is without CT  correlate. Hypermetabolic left external iliac adenopathy. This  measures 1.3 cm and a S.U.V. max of 5.0 on image 181. On the prior  PET, this node measured 1.6 cm and on the order of a S.U.V. max of  2.6.  [place]: Multifocal marrow hypermetabolism consistent with osseous  involvement. Index lesion in the right humeral head measures a  S.U.V. max of 14.4 on image 53 and is new.  A hypermetabolic lytic lesion within the left iliac wing is new or  progressive and measures a S.U.V. max of 8.1 on image 155.  CT images performed for attenuation correction demonstrate  increased number and size of lymph nodes within the neck. Index  right jugulodigastric node measures 9 mm short axis on image 34  versus 6 mm on the prior PET. Chest, abdomen, and pelvic findings  deferred to recent diagnostic CTs. Trace right-sided pleural fluid  is new. Left upper lobe radiation fibrosis. Punctate left renal  calculi. Cholelithiasis. Fibroid uterus. L4 osseous metastasis  has possible epidural component on image 144 transverse.  IMPRESSION:  1. Increasing hypermetabolism associated with adenopathy within  the neck, chest, and  pelvis, as detailed above. Findings are most  likely indicative of progression of lymphoma.  2. Progressive osseous metastasis, as detailed above. An L4 lesion  has possible epidural component. Consider pre and post contrast  lumbar spine MRI.  3. Hypermetabolism at the right lower neck and thoracic inlet  surrounds the right internal jugular vein. This appears somewhat  hyperattenuating. Cannot exclude thrombus. Consider right upper  extremity venous ultrasound with attention to this area.  4. Likely dental inflammatory left mandibular hypermetabolism.  Consider physical exam correlation.   ASSESSMENT: 78 year old female with  #1Patient with history of low-grade non-Hodgkin lymphoma she initially received CVP Rituxan and then subsequently on maintenance Rituxan, now on R Bendamustine.  She had a PET/CT on 1/10 which demonstrated response to her therapy. She will receive a total of 6 cycles R Bendamustine and the began maintenance Rituxan on 3/28.  #2 metastatic breast carcinoma originally diagnosed in 2009 originally on letrozole 2.5 mg daily, now on Arimidex.  #3 L4 bone metastasis which is painful. Patient is now status post palliative radiation therapy.  Also receiving Xgeva q28 days (currently on hold).    #4 Eczema   PLAN:  #1. Patient will proceed with Rituxan today.  She will follow up with the oral surgeon she was referred to by Dr. Kristin Bruins for teeth extractions prior to [place] resuming xgeva.    #2 she will return in 3 weeks for her next xgeva.    All questions were answered. The patient knows to call the clinic with any problems, questions or concerns. We can certainly see the patient much sooner if necessary.  I spent 25 minutes counseling the patient face to face. The total time spent in the appointment was 30 minutes.  Cherie Ouch Lyn Hollingshead, NP Medical Oncology [facility] Phone: [phone]    06/24/2013, 10:56 AM

## 2013-06-24 NOTE — Patient Instructions (Addendum)
[facility] Discharge Instructions for Patients Receiving Chemotherapy  Today you received the following chemotherapy agents Rituxan.  To help prevent nausea and vomiting after your treatment, we encourage you to take your nausea medication as prescribed.   If you develop nausea and vomiting that is not controlled by your nausea medication, call the clinic.   BELOW ARE SYMPTOMS THAT SHOULD BE REPORTED IMMEDIATELY:  *FEVER GREATER THAN 100.5 F  *CHILLS WITH OR WITHOUT FEVER  NAUSEA AND VOMITING THAT IS NOT CONTROLLED WITH YOUR NAUSEA MEDICATION  *UNUSUAL SHORTNESS OF BREATH  *UNUSUAL BRUISING OR BLEEDING  TENDERNESS IN MOUTH AND THROAT WITH OR WITHOUT PRESENCE OF ULCERS  *URINARY PROBLEMS  *BOWEL PROBLEMS  UNUSUAL RASH Items with * indicate a potential emergency and should be followed up as soon as possible.  Feel free to call the clinic you have any questions or concerns. The clinic phone number is [phone].    

## 2013-07-07 ENCOUNTER — Telehealth (HOSPITAL_COMMUNITY): Payer: Self-pay | Admitting: Dentistry

## 2013-07-07 NOTE — Telephone Encounter (Signed)
07/07/2013  Patient:            Helen [name] Date of Birth:  06/14/1935 MRN:                [id number]  I was contacted by Dr. Rayfield Citizen Assistant.(Oral surgeon) [name] was seen yesterday for dental consultation. Patient is now planned for extraction of tooth numbers 1, 3, 30 on the 1st visit, tooth numbers 14, 15, 16, 17, 18 on the second visit barring any initial complication, and then the possible extraction of supernumerary tooth on the lower left quadrant after that. The patient is aware of the risk for osteonecrosis of the jaw related to previous Xgeva therapy. Dr. Kristin Bruins

## 2013-07-08 ENCOUNTER — Ambulatory Visit: Payer: Medicare Other

## 2013-07-15 ENCOUNTER — Ambulatory Visit (HOSPITAL_BASED_OUTPATIENT_CLINIC_OR_DEPARTMENT_OTHER): Payer: Medicare Other

## 2013-07-15 ENCOUNTER — Encounter: Payer: Self-pay | Admitting: Adult Health

## 2013-07-15 ENCOUNTER — Ambulatory Visit (HOSPITAL_BASED_OUTPATIENT_CLINIC_OR_DEPARTMENT_OTHER): Payer: Medicare Other | Admitting: Adult Health

## 2013-07-15 ENCOUNTER — Telehealth: Payer: Self-pay | Admitting: *Deleted

## 2013-07-15 ENCOUNTER — Other Ambulatory Visit (HOSPITAL_BASED_OUTPATIENT_CLINIC_OR_DEPARTMENT_OTHER): Payer: Medicare Other | Admitting: Lab

## 2013-07-15 ENCOUNTER — Telehealth: Payer: Self-pay | Admitting: Oncology

## 2013-07-15 VITALS — BP 116/50 | HR 66 | Temp 98.4°F | Resp 20

## 2013-07-15 VITALS — BP 114/69 | HR 65 | Temp 98.7°F | Resp 20 | Ht 62.0 in | Wt 137.9 lb

## 2013-07-15 DIAGNOSIS — C50919 Malignant neoplasm of unspecified site of unspecified female breast: Secondary | ICD-10-CM

## 2013-07-15 DIAGNOSIS — Z853 Personal history of malignant neoplasm of breast: Secondary | ICD-10-CM

## 2013-07-15 DIAGNOSIS — Z5112 Encounter for antineoplastic immunotherapy: Secondary | ICD-10-CM

## 2013-07-15 DIAGNOSIS — C8589 Other specified types of non-Hodgkin lymphoma, extranodal and solid organ sites: Secondary | ICD-10-CM

## 2013-07-15 DIAGNOSIS — C8590 Non-Hodgkin lymphoma, unspecified, unspecified site: Secondary | ICD-10-CM

## 2013-07-15 DIAGNOSIS — C7951 Secondary malignant neoplasm of bone: Secondary | ICD-10-CM

## 2013-07-15 LAB — COMPREHENSIVE METABOLIC PANEL (CC13)
AST: 14 U/L (ref 5–34)
Alkaline Phosphatase: 74 U/L (ref 40–150)
BUN: 10.8 mg/dL (ref 7.0–26.0)
CO2: 27 mEq/L (ref 22–29)
Calcium: 9.9 mg/dL (ref 8.4–10.4)
Chloride: 106 mEq/L (ref 98–109)
Creatinine: 0.7 mg/dL (ref 0.6–1.1)
Glucose: 89 mg/dl (ref 70–140)
Potassium: 4 mEq/L (ref 3.5–5.1)
Sodium: 140 mEq/L (ref 136–145)

## 2013-07-15 LAB — CBC WITH DIFFERENTIAL/PLATELET
BASO%: 0.8 % (ref 0.0–2.0)
LYMPH%: 12.6 % — ABNORMAL LOW (ref 14.0–49.7)
MCHC: 31.8 g/dL (ref 31.5–36.0)
MONO#: 0.9 10*3/uL (ref 0.1–0.9)
RBC: 3.88 10*6/uL (ref 3.70–5.45)
RDW: 14.9 % — ABNORMAL HIGH (ref 11.2–14.5)
WBC: 3.6 10*3/uL — ABNORMAL LOW (ref 3.9–10.3)
lymph#: 0.5 10*3/uL — ABNORMAL LOW (ref 0.9–3.3)

## 2013-07-15 MED ORDER — SODIUM CHLORIDE 0.9 % IV SOLN
Freq: Once | INTRAVENOUS | Status: AC
  Administered 2013-07-15: 12:00:00 via INTRAVENOUS

## 2013-07-15 MED ORDER — DIPHENHYDRAMINE HCL 25 MG PO CAPS
ORAL_CAPSULE | ORAL | Status: AC
  Filled 2013-07-15: qty 2

## 2013-07-15 MED ORDER — HEPARIN SOD (PORK) LOCK FLUSH 100 UNIT/ML IV SOLN
500.0000 [IU] | Freq: Once | INTRAVENOUS | Status: AC | PRN
  Administered 2013-07-15: 500 [IU]
  Filled 2013-07-15: qty 5

## 2013-07-15 MED ORDER — SODIUM CHLORIDE 0.9 % IJ SOLN
10.0000 mL | INTRAMUSCULAR | Status: DC | PRN
  Administered 2013-07-15: 10 mL
  Filled 2013-07-15: qty 10

## 2013-07-15 MED ORDER — DIPHENHYDRAMINE HCL 25 MG PO CAPS
50.0000 mg | ORAL_CAPSULE | Freq: Once | ORAL | Status: AC
  Administered 2013-07-15: 50 mg via ORAL

## 2013-07-15 MED ORDER — SODIUM CHLORIDE 0.9 % IV SOLN
375.0000 mg/m2 | Freq: Once | INTRAVENOUS | Status: AC
  Administered 2013-07-15: 600 mg via INTRAVENOUS
  Filled 2013-07-15: qty 60

## 2013-07-15 MED ORDER — ACETAMINOPHEN 325 MG PO TABS
ORAL_TABLET | ORAL | Status: AC
  Filled 2013-07-15: qty 2

## 2013-07-15 MED ORDER — ACETAMINOPHEN 325 MG PO TABS
650.0000 mg | ORAL_TABLET | Freq: Once | ORAL | Status: AC
  Administered 2013-07-15: 650 mg via ORAL

## 2013-07-15 NOTE — Progress Notes (Addendum)
OFFICE PROGRESS NOTE  CC  [name], MD [address] [facility] [address] [place], [address] [place] [address]  DIAGNOSIS: 78 year old female with:  #1 left breast carcinoma originally presenting as a fungating mass. Patient underwent neoadjuvant chemotherapy followed by mastectomy. She is currently on Arimidex 1mg  daily.   #2 low-grade non-Hodgkin lymphoma status post CVP and Rituxan now receiving R-Bendamustine, followed by maintenance Rituxan.   PRIOR THERAPY:  #1 the patient was diagnosed with metastatic breast carcinoma beginning October 2009. She returned her 1 chemotherapy followed by mastectomy radiation. She was on letrozole 2.5 mg daily, but has changed to Arimidex 1mg  daily.  Also receives Xgeva every 28 days.    #2 Was diagnosed with low-grade non-Hodgkin lymphoma  12. She received 6 cycles of CVP and Rituxan, followed by maintenance Rituxan, who recurred and is now on R-Bendamustine.    #3 Pt underwent 4 cycles of R-Bendamustin PET/CT on 1/10 showed CR to therapy, will complete 2 cycles of treatment and the proceed to maintenance Rituxan.  #4 Maintenance Rituximab every 3 weeks started on 01/28/13.  CURRENT THERAPY:  Arimidex daily, Xgeva (currently on hold), and Maintenance Rituximab  INTERVAL HISTORY: Helen [name] 78 y.o. female returns for evaluation today prior to her maintenance rituximab.  She is feeling well today.  She is scheduled to have 5 teeth extracted on 08/16/13 by Dr. Jeanice Lim.  She is not looking forward to it.  She denies fevers, chills, night sweats, unintentional weight loss, nausea, vomiting, new pain, numbness, or any other concerns.    MEDICAL HISTORY: Past Medical History  Diagnosis Date  . Arthritis   . Blood transfusion 2009  . Breast CA 08/2008    (LT) breast ca dx 10/09/Chemo  . Lymphoma 09/09/2011    NHL  . Leukemia, acute, in remission 2012    Pt. not sure of type  . Hypertension   . Seizures 1980's    from medication reaction/Pt.  [name]  Hx of radiation therapy 09/11/08 -10/31/08    left breast  . Metastasis to bone 07/03/2012    MRI L spine  . History of radiation therapy eot 07/30/12    lumbar spine L4 /l shoulder    ALLERGIES:  is allergic to codeine; penicillins; sulfonamide derivatives; tape; and tramadol hcl.  MEDICATIONS:  Current Outpatient Prescriptions  Medication Sig Dispense Refill  . anastrozole (ARIMIDEX) 1 MG tablet Take 1 tablet (1 mg total) by mouth daily.  30 tablet  4  . calcium-vitamin D (OSCAL WITH D) 500-200 MG-UNIT per tablet Take 2 tablets by mouth daily.      [name] oxycodone (OXY-IR) 5 MG capsule Take 1 capsule (5 mg total) by mouth every 4 (four) hours as needed.  90 capsule  0   No current facility-administered medications for this visit.    SURGICAL HISTORY:  Past Surgical History  Procedure Laterality Date  . Mastectomy    . Thyroidectomy  1965  . Appendectomy    . Tonsillectomy      REVIEW OF SYSTEMS:  A 10 point review of systems was conducted and is otherwise negative except for what is noted above.    PHYSICAL EXAMINATION:  BP 114/69  Pulse 65  Temp(Src) 98.7 F (37.1 C) (Oral)  Resp 20  Ht 5\' 2"  (1.575 m)  Wt 137 lb 14.4 oz (62.551 kg)  BMI 25.22 kg/m2 General: Patient is a well appearing female in no acute distress HEENT: PERRLA, sclerae anicteric no conjunctival pallor, MMM, no facial  swelling, tenderness, right 3rd molar appears broken, and 4th molar is broken now as well Neck: supple, no palpable adenopathy Lungs: clear to auscultation bilaterally, no wheezes, rhonchi, or rales Cardiovascular: regular rate rhythm, S1, S2, no murmurs, rubs or gallops Abdomen: Soft, mild epigastric tenderness, non-distended, normoactive bowel sounds, no HSM Extremities: warm and well perfused, no clubbing, cyanosis, or edema Skin: No rash or lesion   Neuro: Non-focal ECOG PERFORMANCE STATUS: 1 - Symptomatic but completely ambulatory  LABORATORY DATA: Lab Results  Component Value Date    WBC 3.6* 07/15/2013   HGB 10.4* 07/15/2013   HCT 32.7* 07/15/2013   MCV 84.3 07/15/2013   PLT 243 07/15/2013      Chemistry      Component Value Date/Time   NA 140 07/15/2013 0935   NA 136 07/06/2012 1122   NA 142 06/01/2012 1008   K 4.0 07/15/2013 0935   K 3.8 07/06/2012 1122   K 3.8 06/01/2012 1008   CL 107 04/22/2013 0922   CL 102 07/06/2012 1122   CL 103 06/01/2012 1008   CO2 27 07/15/2013 0935   CO2 20 07/06/2012 1122   CO2 25 06/01/2012 1008   BUN 10.8 07/15/2013 0935   BUN 8 07/06/2012 1122   BUN 9 06/01/2012 1008   CREATININE 0.7 07/15/2013 0935   CREATININE 0.60 07/06/2012 1122   CREATININE 0.9 06/01/2012 1008      Component Value Date/Time   CALCIUM 9.9 07/15/2013 0935   CALCIUM 9.1 07/06/2012 1122   CALCIUM 9.8 06/01/2012 1008   ALKPHOS 74 07/15/2013 0935   ALKPHOS 69 02/10/2012 1035   ALKPHOS 78 08/14/2010 1251   AST 14 07/15/2013 0935   AST 20 02/10/2012 1035   AST 19 08/14/2010 1251   ALT 13 07/15/2013 0935   ALT 19 02/10/2012 1035   ALT 9* 08/14/2010 1251   BILITOT 0.28 07/15/2013 0935   BILITOT 0.4 02/10/2012 1035   BILITOT 0.40 08/14/2010 1251       RADIOGRAPHIC STUDIES: NUCLEAR MEDICINE PET CT SKULL BASE TO THIGH  Technique: Technique: 18.8 mCi F-18 FDG was injected  intravenously. CT data was obtained and used for attenuation  correction and anatomic localization only. (This was not acquired  as a diagnostic CT examination.) Additional exam technical data  entered on technologist worksheet.  Comparison: PET of 02/07/2011. CTs of 06/01/2012.  Findings: Neck: Hypermetabolism within the left side of the  mandible, with suspicion of concurrent periapical focal lucency on  image 24.  Bilateral cervical hypermetabolic lymph nodes. Hypermetabolism  which is primarily felt to be nodal surrounds the right jugular  vein and extends into the thoracic inlet. This measures a S.U.V.  max of 9.9 on image 57  Chest: Hypermetabolic right axillary node. This measures 1.7 cm  and a S.U.V. max  of 6.1 on image 82. On the prior PET, this node  measured 1.7 cm and a S.U.V. max of 3.1.  Small prevascular nodes which are hypermetabolic, including on  image 72.  Abdomen/Pelvis: Right adrenal hypermetabolism which is without CT  correlate. Hypermetabolic left external iliac adenopathy. This  measures 1.3 cm and a S.U.V. max of 5.0 on image 181. On the prior  PET, this node measured 1.6 cm and on the order of a S.U.V. max of  2.6.  Skelton: Multifocal marrow hypermetabolism consistent with osseous  involvement. Index lesion in the right humeral head measures a  S.U.V. max of 14.4 on image 53 and is new.  A hypermetabolic lytic lesion within  the left iliac wing is new or  progressive and measures a S.U.V. max of 8.1 on image 155.  CT images performed for attenuation correction demonstrate  increased number and size of lymph nodes within the neck. Index  right jugulodigastric node measures 9 mm short axis on image 34  versus 6 mm on the prior PET. Chest, abdomen, and pelvic findings  deferred to recent diagnostic CTs. Trace right-sided pleural fluid  is new. Left upper lobe radiation fibrosis. Punctate left renal  calculi. Cholelithiasis. Fibroid uterus. L4 osseous metastasis  has possible epidural component on image 144 transverse.  IMPRESSION:  1. Increasing hypermetabolism associated with adenopathy within  the neck, chest, and pelvis, as detailed above. Findings are most  likely indicative of progression of lymphoma.  2. Progressive osseous metastasis, as detailed above. An L4 lesion  has possible epidural component. Consider pre and post contrast  lumbar spine MRI.  3. Hypermetabolism at the right lower neck and thoracic inlet  surrounds the right internal jugular vein. This appears somewhat  hyperattenuating. Cannot exclude thrombus. Consider right upper  extremity venous ultrasound with attention to this area.  4. Likely dental inflammatory left mandibular hypermetabolism.   Consider physical exam correlation.   ASSESSMENT: 78 year old female with  #1Patient with history of low-grade non-Hodgkin lymphoma she initially received CVP Rituxan and then subsequently on maintenance Rituxan, now on R Bendamustine.  She had a PET/CT on 1/10 which demonstrated response to her therapy. She will receive a total of 6 cycles R Bendamustine and the began maintenance Rituxan on 3/28.  #2 metastatic breast carcinoma originally diagnosed in 2009 originally on letrozole 2.5 mg daily, now on Arimidex.  #3 L4 bone metastasis which is painful. Patient is now status post palliative radiation therapy.  Also receiving Xgeva q28 days (currently on hold).    #4 Eczema   PLAN:  #1. Patient will proceed with Rituxan today.  She will proceed with teeth extraction on 08/16/13.  I have ordered restaging CT scans since its been 6 months since her last scans.    #2   She will return in 3 weeks for labs, evaluation and her next Rituxan.    All questions were answered. The patient knows to call the clinic with any problems, questions or concerns. We can certainly see the patient much sooner if necessary.  I spent 25 minutes counseling the patient face to face. The total time spent in the appointment was 30 minutes.  Cherie Ouch Lyn Hollingshead, NP Medical Oncology [facility] Phone: [phone]    07/16/2013, 10:46 AM  ATTENDING'S ATTESTATION:  I personally reviewed patient's chart, examined patient myself, formulated the treatment plan as followed.    Ms. Sanville continues to do remarkably well with her low-grade Hodgkin lymphoma as well as breast cancer. She remains on Arimidex for her breast cancer and she is also receiving bisphosphonates therapy every 28 days. For her low-grade lymphoma she continues to be on Rituxan maintenance. She is tolerating this well. She will proceed with her scheduled treatment today.  Drue Second, MD Medical/Oncology [facility] [phone] (beeper) [phone] (Office)  07/24/2013, 11:09 PM

## 2013-07-15 NOTE — Telephone Encounter (Signed)
Per staff message and POF I have scheduled appts.  JMW  

## 2013-07-15 NOTE — Patient Instructions (Addendum)
[facility] Discharge Instructions for Patients Receiving Chemotherapy  Today you received the following chemotherapy agents Rituxan.  To help prevent nausea and vomiting after your treatment, we encourage you to take your nausea medication as prescribed.   If you develop nausea and vomiting that is not controlled by your nausea medication, call the clinic.   BELOW ARE SYMPTOMS THAT SHOULD BE REPORTED IMMEDIATELY:  *FEVER GREATER THAN 100.5 F  *CHILLS WITH OR WITHOUT FEVER  NAUSEA AND VOMITING THAT IS NOT CONTROLLED WITH YOUR NAUSEA MEDICATION  *UNUSUAL SHORTNESS OF BREATH  *UNUSUAL BRUISING OR BLEEDING  TENDERNESS IN MOUTH AND THROAT WITH OR WITHOUT PRESENCE OF ULCERS  *URINARY PROBLEMS  *BOWEL PROBLEMS  UNUSUAL RASH Items with * indicate a potential emergency and should be followed up as soon as possible.  Feel free to call the clinic you have any questions or concerns. The clinic phone number is [phone].    

## 2013-07-15 NOTE — Patient Instructions (Signed)
Doing well.  We will see you back in 3 weeks for Rituxan.  We have ordered a CT scan of the chest abdomen and pelvis.  Please call us if you have any questions or concerns.

## 2013-07-15 NOTE — Telephone Encounter (Signed)
, °

## 2013-07-22 ENCOUNTER — Ambulatory Visit (HOSPITAL_COMMUNITY)
Admission: RE | Admit: 2013-07-22 | Discharge: 2013-07-22 | Disposition: A | Discharge status: Home / Self Care | Payer: Medicare Other | Source: Ambulatory Visit | Attending: Adult Health | Admitting: Adult Health

## 2013-07-22 ENCOUNTER — Encounter (HOSPITAL_COMMUNITY): Payer: Self-pay

## 2013-07-22 DIAGNOSIS — Z923 Personal history of irradiation: Secondary | ICD-10-CM | POA: Insufficient documentation

## 2013-07-22 DIAGNOSIS — K7689 Other specified diseases of liver: Secondary | ICD-10-CM | POA: Insufficient documentation

## 2013-07-22 DIAGNOSIS — C8589 Other specified types of non-Hodgkin lymphoma, extranodal and solid organ sites: Secondary | ICD-10-CM | POA: Insufficient documentation

## 2013-07-22 DIAGNOSIS — M4310 Spondylolisthesis, site unspecified: Secondary | ICD-10-CM | POA: Insufficient documentation

## 2013-07-22 DIAGNOSIS — K8020 Calculus of gallbladder without cholecystitis without obstruction: Secondary | ICD-10-CM | POA: Insufficient documentation

## 2013-07-22 DIAGNOSIS — Z9010 Acquired absence of unspecified breast and nipple: Secondary | ICD-10-CM | POA: Insufficient documentation

## 2013-07-22 DIAGNOSIS — Z79899 Other long term (current) drug therapy: Secondary | ICD-10-CM | POA: Insufficient documentation

## 2013-07-22 DIAGNOSIS — C8590 Non-Hodgkin lymphoma, unspecified, unspecified site: Secondary | ICD-10-CM

## 2013-07-22 DIAGNOSIS — Z853 Personal history of malignant neoplasm of breast: Secondary | ICD-10-CM | POA: Insufficient documentation

## 2013-07-22 DIAGNOSIS — M948X9 Other specified disorders of cartilage, unspecified sites: Secondary | ICD-10-CM | POA: Insufficient documentation

## 2013-07-22 MED ORDER — IOHEXOL 300 MG/ML  SOLN
100.0000 mL | Freq: Once | INTRAMUSCULAR | Status: AC | PRN
  Administered 2013-07-22: 100 mL via INTRAVENOUS

## 2013-08-05 ENCOUNTER — Ambulatory Visit (HOSPITAL_BASED_OUTPATIENT_CLINIC_OR_DEPARTMENT_OTHER): Payer: Medicare Other

## 2013-08-05 ENCOUNTER — Encounter: Payer: Self-pay | Admitting: Adult Health

## 2013-08-05 ENCOUNTER — Encounter: Payer: Self-pay | Admitting: Oncology

## 2013-08-05 ENCOUNTER — Other Ambulatory Visit (HOSPITAL_BASED_OUTPATIENT_CLINIC_OR_DEPARTMENT_OTHER): Payer: Medicare Other

## 2013-08-05 ENCOUNTER — Telehealth: Payer: Self-pay | Admitting: *Deleted

## 2013-08-05 ENCOUNTER — Ambulatory Visit (HOSPITAL_BASED_OUTPATIENT_CLINIC_OR_DEPARTMENT_OTHER): Payer: Medicare Other | Admitting: Adult Health

## 2013-08-05 VITALS — BP 130/65 | HR 64 | Temp 97.0°F | Resp 20

## 2013-08-05 VITALS — BP 116/65 | HR 76 | Temp 98.4°F | Resp 20 | Ht 62.0 in | Wt 137.7 lb

## 2013-08-05 DIAGNOSIS — C8589 Other specified types of non-Hodgkin lymphoma, extranodal and solid organ sites: Secondary | ICD-10-CM

## 2013-08-05 DIAGNOSIS — C7951 Secondary malignant neoplasm of bone: Secondary | ICD-10-CM

## 2013-08-05 DIAGNOSIS — C8590 Non-Hodgkin lymphoma, unspecified, unspecified site: Secondary | ICD-10-CM

## 2013-08-05 DIAGNOSIS — R21 Rash and other nonspecific skin eruption: Secondary | ICD-10-CM

## 2013-08-05 DIAGNOSIS — Z5112 Encounter for antineoplastic immunotherapy: Secondary | ICD-10-CM

## 2013-08-05 DIAGNOSIS — L259 Unspecified contact dermatitis, unspecified cause: Secondary | ICD-10-CM

## 2013-08-05 DIAGNOSIS — Z853 Personal history of malignant neoplasm of breast: Secondary | ICD-10-CM

## 2013-08-05 DIAGNOSIS — C50919 Malignant neoplasm of unspecified site of unspecified female breast: Secondary | ICD-10-CM

## 2013-08-05 LAB — CBC WITH DIFFERENTIAL/PLATELET
BASO%: 0.6 % (ref 0.0–2.0)
Basophils Absolute: 0 10*3/uL (ref 0.0–0.1)
EOS%: 11.4 % — ABNORMAL HIGH (ref 0.0–7.0)
HCT: 33 % — ABNORMAL LOW (ref 34.8–46.6)
HGB: 10.3 g/dL — ABNORMAL LOW (ref 11.6–15.9)
MCH: 26.1 pg (ref 25.1–34.0)
MONO#: 0.9 10*3/uL (ref 0.1–0.9)
NEUT%: 55.6 % (ref 38.4–76.8)
RDW: 14.9 % — ABNORMAL HIGH (ref 11.2–14.5)
WBC: 3.5 10*3/uL — ABNORMAL LOW (ref 3.9–10.3)
lymph#: 0.2 10*3/uL — ABNORMAL LOW (ref 0.9–3.3)

## 2013-08-05 LAB — COMPREHENSIVE METABOLIC PANEL (CC13)
ALT: 12 U/L (ref 0–55)
Albumin: 3.6 g/dL (ref 3.5–5.0)
CO2: 25 mEq/L (ref 22–29)
Calcium: 9.9 mg/dL (ref 8.4–10.4)
Chloride: 106 mEq/L (ref 98–109)
Glucose: 97 mg/dl (ref 70–140)
Potassium: 3.9 mEq/L (ref 3.5–5.1)
Sodium: 140 mEq/L (ref 136–145)
Total Bilirubin: 0.24 mg/dL (ref 0.20–1.20)
Total Protein: 7.3 g/dL (ref 6.4–8.3)

## 2013-08-05 MED ORDER — PREDNISONE (PAK) 10 MG PO TABS: 10.0000 mg | ORAL_TABLET | ORAL | Status: DC

## 2013-08-05 MED ORDER — ACETAMINOPHEN 325 MG PO TABS
650.0000 mg | ORAL_TABLET | Freq: Once | ORAL | Status: AC
  Administered 2013-08-05: 650 mg via ORAL

## 2013-08-05 MED ORDER — DIPHENHYDRAMINE HCL 25 MG PO CAPS
ORAL_CAPSULE | ORAL | Status: AC
  Filled 2013-08-05: qty 2

## 2013-08-05 MED ORDER — SODIUM CHLORIDE 0.9 % IJ SOLN
10.0000 mL | INTRAMUSCULAR | Status: DC | PRN
  Administered 2013-08-05: 10 mL
  Filled 2013-08-05: qty 10

## 2013-08-05 MED ORDER — SODIUM CHLORIDE 0.9 % IV SOLN
Freq: Once | INTRAVENOUS | Status: AC
  Administered 2013-08-05: 11:00:00 via INTRAVENOUS

## 2013-08-05 MED ORDER — DIPHENHYDRAMINE HCL 25 MG PO CAPS
50.0000 mg | ORAL_CAPSULE | Freq: Once | ORAL | Status: AC
  Administered 2013-08-05: 50 mg via ORAL

## 2013-08-05 MED ORDER — SODIUM CHLORIDE 0.9 % IV SOLN
375.0000 mg/m2 | Freq: Once | INTRAVENOUS | Status: AC
  Administered 2013-08-05: 600 mg via INTRAVENOUS
  Filled 2013-08-05: qty 60

## 2013-08-05 MED ORDER — HEPARIN SOD (PORK) LOCK FLUSH 100 UNIT/ML IV SOLN
500.0000 [IU] | Freq: Once | INTRAVENOUS | Status: AC | PRN
  Administered 2013-08-05: 500 [IU]
  Filled 2013-08-05: qty 5

## 2013-08-05 MED ORDER — ACETAMINOPHEN 325 MG PO TABS
ORAL_TABLET | ORAL | Status: AC
  Filled 2013-08-05: qty 2

## 2013-08-05 NOTE — Patient Instructions (Signed)
Doing well.  Proceed with Rituxan.  CT scans showed no lymphoma in the chest abdomen or pelvis.  Please call us if you have any questions or concerns.

## 2013-08-05 NOTE — Telephone Encounter (Signed)
appts made and printed. Pt is aware that tx will follow her appts. i emailed MW to add the tx....td

## 2013-08-05 NOTE — Progress Notes (Addendum)
OFFICE PROGRESS NOTE  CC  [name], MD [address] [facility] [address] [place], [address] [place] [address]  DIAGNOSIS: 78 year old female with:  #1 left breast carcinoma originally presenting as a fungating mass. Patient underwent neoadjuvant chemotherapy followed by mastectomy. She is currently on Arimidex 1mg  daily.   #2 low-grade non-Hodgkin lymphoma status post CVP and Rituxan now receiving R-Bendamustine, followed by maintenance Rituxan.   PRIOR THERAPY:  #1 the patient was diagnosed with metastatic breast carcinoma beginning October 2009. She returned her 1 chemotherapy followed by mastectomy radiation. She was on letrozole 2.5 mg daily, but has changed to Arimidex 1mg  daily.  Also receives Xgeva every 28 days.    #2 Was diagnosed with low-grade non-Hodgkin lymphoma  12. She received 6 cycles of CVP and Rituxan, followed by maintenance Rituxan, who recurred and is now on R-Bendamustine.    #3 Pt underwent 4 cycles of R-Bendamustin PET/CT on 1/10 showed CR to therapy, will complete 2 cycles of treatment and the proceed to maintenance Rituxan.  #4 Maintenance Rituximab every 3 weeks started on 01/28/13.  Last CT scans on 07/22/13 of chest/abdomen/pelvis show no recurrence of lymphoma and stable bone disease.    CURRENT THERAPY:  Arimidex daily, Xgeva (currently on hold), and Maintenance Rituximab  INTERVAL HISTORY: Helen [name] 78 y.o. female returns for evaluation today prior to her maintenance rituximab.  She is feeling well today.  She is scheduled to have 5 teeth extracted on 08/16/13 by Dr. Jeanice Lim.  She is requesting results from CT scans.  She feels like blood is not circulating well in her right leg, this has been going on for 6 months.  She has joint pain in her knees and feet.  Otherwise, she denies fevers, chills, weight loss, nausea, vomiting, constipation, diarrhea, numbness or further concerns.    MEDICAL HISTORY: Past Medical History  Diagnosis Date  . Arthritis    . Blood transfusion 2009  . Breast CA 08/2008    (LT) breast ca dx 10/09/Chemo  . Lymphoma 09/09/2011    NHL  . Leukemia, acute, in remission 2012    Pt. not sure of type  . Hypertension   . Seizures 1980's    from medication reaction/Pt.  [name] Hx of radiation therapy 09/11/08 -10/31/08    left breast  . Metastasis to bone 07/03/2012    MRI L spine  . History of radiation therapy eot 07/30/12    lumbar spine L4 /l shoulder    ALLERGIES:  is allergic to codeine; penicillins; sulfonamide derivatives; tape; and tramadol hcl.  MEDICATIONS:  Current Outpatient Prescriptions  Medication Sig Dispense Refill  . anastrozole (ARIMIDEX) 1 MG tablet Take 1 tablet (1 mg total) by mouth daily.  30 tablet  4  . calcium-vitamin D (OSCAL WITH D) 500-200 MG-UNIT per tablet Take 2 tablets by mouth daily.      [name] oxycodone (OXY-IR) 5 MG capsule Take 1 capsule (5 mg total) by mouth every 4 (four) hours as needed.  90 capsule  0   No current facility-administered medications for this visit.    SURGICAL HISTORY:  Past Surgical History  Procedure Laterality Date  . Mastectomy    . Thyroidectomy  1965  . Appendectomy    . Tonsillectomy      REVIEW OF SYSTEMS:  A 10 point review of systems was conducted and is otherwise negative except for what is noted above.    PHYSICAL EXAMINATION:  BP 116/65  Pulse 76  Temp(Src)  98.4 F (36.9 C) (Oral)  Resp 20  Ht 5\' 2"  (1.575 m)  Wt 137 lb 11.2 oz (62.46 kg)  BMI 25.18 kg/m2 General: Patient is a well appearing female in no acute distress HEENT: PERRLA, sclerae anicteric no conjunctival pallor, MMM, no facial swelling, tenderness, right 3rd molar appears broken, and 4th molar is broken now as well Neck: supple, no palpable adenopathy Lungs: clear to auscultation bilaterally, no wheezes, rhonchi, or rales Cardiovascular: regular rate rhythm, S1, S2, no murmurs, rubs or gallops Abdomen: Soft, mild epigastric tenderness, non-distended, normoactive bowel  sounds, no HSM Extremities: warm and well perfused, no clubbing, cyanosis, or edema, 2+ pulses in bilateral lower extremities Skin: No rash or lesion   Neuro: Non-focal ECOG PERFORMANCE STATUS: 1 - Symptomatic but completely ambulatory  LABORATORY DATA: Lab Results  Component Value Date   WBC 3.5* 08/05/2013   HGB 10.3* 08/05/2013   HCT 33.0* 08/05/2013   MCV 83.8 08/05/2013   PLT 271 08/05/2013      Chemistry      Component Value Date/Time   NA 140 07/15/2013 0935   NA 136 07/06/2012 1122   NA 142 06/01/2012 1008   K 4.0 07/15/2013 0935   K 3.8 07/06/2012 1122   K 3.8 06/01/2012 1008   CL 107 04/22/2013 0922   CL 102 07/06/2012 1122   CL 103 06/01/2012 1008   CO2 27 07/15/2013 0935   CO2 20 07/06/2012 1122   CO2 25 06/01/2012 1008   BUN 10.8 07/15/2013 0935   BUN 8 07/06/2012 1122   BUN 9 06/01/2012 1008   CREATININE 0.7 07/15/2013 0935   CREATININE 0.60 07/06/2012 1122   CREATININE 0.9 06/01/2012 1008      Component Value Date/Time   CALCIUM 9.9 07/15/2013 0935   CALCIUM 9.1 07/06/2012 1122   CALCIUM 9.8 06/01/2012 1008   ALKPHOS 74 07/15/2013 0935   ALKPHOS 69 02/10/2012 1035   ALKPHOS 78 08/14/2010 1251   AST 14 07/15/2013 0935   AST 20 02/10/2012 1035   AST 19 08/14/2010 1251   ALT 13 07/15/2013 0935   ALT 19 02/10/2012 1035   ALT 9* 08/14/2010 1251   BILITOT 0.28 07/15/2013 0935   BILITOT 0.4 02/10/2012 1035   BILITOT 0.40 08/14/2010 1251       RADIOGRAPHIC STUDIES: NUCLEAR MEDICINE PET CT SKULL BASE TO THIGH  Technique: Technique: 18.8 mCi F-18 FDG was injected  intravenously. CT data was obtained and used for attenuation  correction and anatomic localization only. (This was not acquired  as a diagnostic CT examination.) Additional exam technical data  entered on technologist worksheet.  Comparison: PET of 02/07/2011. CTs of 06/01/2012.  Findings: Neck: Hypermetabolism within the left side of the  mandible, with suspicion of concurrent periapical focal lucency on  image 24.  Bilateral  cervical hypermetabolic lymph nodes. Hypermetabolism  which is primarily felt to be nodal surrounds the right jugular  vein and extends into the thoracic inlet. This measures a S.U.V.  max of 9.9 on image 57  Chest: Hypermetabolic right axillary node. This measures 1.7 cm  and a S.U.V. max of 6.1 on image 82. On the prior PET, this node  measured 1.7 cm and a S.U.V. max of 3.1.  Small prevascular nodes which are hypermetabolic, including on  image 72.  Abdomen/Pelvis: Right adrenal hypermetabolism which is without CT  correlate. Hypermetabolic left external iliac adenopathy. This  measures 1.3 cm and a S.U.V. max of 5.0 on image 181. On the prior  PET, this node measured 1.6 cm and on the order of a S.U.V. max of  2.6.  Skelton: Multifocal marrow hypermetabolism consistent with osseous  involvement. Index lesion in the right humeral head measures a  S.U.V. max of 14.4 on image 53 and is new.  A hypermetabolic lytic lesion within the left iliac wing is new or  progressive and measures a S.U.V. max of 8.1 on image 155.  CT images performed for attenuation correction demonstrate  increased number and size of lymph nodes within the neck. Index  right jugulodigastric node measures 9 mm short axis on image 34  versus 6 mm on the prior PET. Chest, abdomen, and pelvic findings  deferred to recent diagnostic CTs. Trace right-sided pleural fluid  is new. Left upper lobe radiation fibrosis. Punctate left renal  calculi. Cholelithiasis. Fibroid uterus. L4 osseous metastasis  has possible epidural component on image 144 transverse.  IMPRESSION:  1. Increasing hypermetabolism associated with adenopathy within  the neck, chest, and pelvis, as detailed above. Findings are most  likely indicative of progression of lymphoma.  2. Progressive osseous metastasis, as detailed above. An L4 lesion  has possible epidural component. Consider pre and post contrast  lumbar spine MRI.  3. Hypermetabolism at the  right lower neck and thoracic inlet  surrounds the right internal jugular vein. This appears somewhat  hyperattenuating. Cannot exclude thrombus. Consider right upper  extremity venous ultrasound with attention to this area.  4. Likely dental inflammatory left mandibular hypermetabolism.  Consider physical exam correlation.   ASSESSMENT: 78 year old female with  #1Patient with history of low-grade non-Hodgkin lymphoma she initially received CVP Rituxan and then subsequently on maintenance Rituxan, now on R Bendamustine.  She had a PET/CT on 1/10 which demonstrated response to her therapy. She will receive a total of 6 cycles R Bendamustine and the began maintenance Rituxan on 3/28.  Last CT chest/abd/pelvis on 07/22/13 showed no recurrence of lymphoma.    #2 metastatic breast carcinoma originally diagnosed in 2009 originally on letrozole 2.5 mg daily, now on Arimidex.  #3 L4 bone metastasis which is painful. Patient is now status post palliative radiation therapy.  Also receiving Xgeva q28 days (currently on hold).    #4 Eczema   PLAN:  #1. Patient will proceed with Rituxan today.  She will proceed with teeth extraction on 08/16/13. I reviewed her CT scans with her and gave her the good results.  She will likely restart Xgeva in December.    #2   She will return in 3 weeks for labs, evaluation and her next Rituxan.    #3 In treatment she realized she forgot to tell us that she had a rash on her arms bilaterally.  She has erythematous rash in the folds of both arms that we've treated her for before.  I will prescribe a Prednisone taper as this rash has responded to it in the past.  I reminded her not to scratch it.  She verbalized understanding of the prescription and will pick it up today.    All questions were answered. The patient knows to call the clinic with any problems, questions or concerns. We can certainly see the patient much sooner if necessary.  I spent 25 minutes counseling the  patient face to face. The total time spent in the appointment was 30 minutes.  Cherie Ouch Lyn Hollingshead, NP Medical Oncology [facility] Phone: [phone] 08/05/2013, 9:35 AM  ATTENDING'S ATTESTATION:  I personally reviewed patient's chart, examined patient myself, formulated  the treatment plan as followed.    Ms. Wadley continues to do is doing well. She is receiving Rituxan as maintenance therapy. She has no clinical evidence of recurrent lymphoma. She is tolerating her antiestrogen therapy quite nicely and has no progressive breast cancer.  Drue Second, MD Medical/Oncology [facility] [phone] (beeper) [phone] (Office)

## 2013-08-05 NOTE — Patient Instructions (Addendum)
[facility] Discharge Instructions for Patients Receiving Chemotherapy  Today you received the following chemotherapy agents: rituxan  To help prevent nausea and vomiting after your treatment, we encourage you to take your nausea medication.  Take it as often as prescribed.     If you develop nausea and vomiting that is not controlled by your nausea medication, call the clinic. If it is after clinic hours your family physician or the after hours number for the clinic or go to the Emergency Department.   BELOW ARE SYMPTOMS THAT SHOULD BE REPORTED IMMEDIATELY:  *FEVER GREATER THAN 100.5 F  *CHILLS WITH OR WITHOUT FEVER  NAUSEA AND VOMITING THAT IS NOT CONTROLLED WITH YOUR NAUSEA MEDICATION  *UNUSUAL SHORTNESS OF BREATH  *UNUSUAL BRUISING OR BLEEDING  TENDERNESS IN MOUTH AND THROAT WITH OR WITHOUT PRESENCE OF ULCERS  *URINARY PROBLEMS  *BOWEL PROBLEMS  UNUSUAL RASH Items with * indicate a potential emergency and should be followed up as soon as possible.  Feel free to call the clinic you have any questions or concerns. The clinic phone number is [phone].   I have been informed and understand all the instructions given to me. I know to contact the clinic, my physician, or go to the Emergency Department if any problems should occur. I do not have any questions at this time, but understand that I may call the clinic during office hours   should I have any questions or need assistance in obtaining follow up care.    __________________________________________  _____________  __________ Signature of Patient or Authorized Representative            Date                   Time    __________________________________________ Nurse's Signature    

## 2013-08-05 NOTE — Telephone Encounter (Signed)
Per staff message and POF I have scheduled appts.  JMW  

## 2013-08-24 ENCOUNTER — Telehealth: Payer: Self-pay | Admitting: *Deleted

## 2013-08-24 NOTE — Telephone Encounter (Signed)
Per MD, notified pt she can come to next scheduled appt nov 14. Pt verbalized understanding. No further concerns.

## 2013-08-24 NOTE — Telephone Encounter (Signed)
She can just come in on her next scheduled appoitment

## 2013-08-24 NOTE — Telephone Encounter (Signed)
Pt called to cancel appt 10/24- she has to go to a funeral in [place]. Reviewed with MD if lab/md/chemo (rituxan) to be rescheduled or pt to continue with scheduled appts.  Next appt lab/md/chemo nov 14. Informed pt we will call her back with additional information regarding her treatment.

## 2013-08-25 ENCOUNTER — Other Ambulatory Visit: Payer: Self-pay | Admitting: Emergency Medicine

## 2013-08-25 DIAGNOSIS — C8590 Non-Hodgkin lymphoma, unspecified, unspecified site: Secondary | ICD-10-CM

## 2013-08-26 ENCOUNTER — Ambulatory Visit: Payer: Self-pay | Admitting: Oncology

## 2013-08-26 ENCOUNTER — Telehealth: Payer: Self-pay | Admitting: Oncology

## 2013-08-26 ENCOUNTER — Encounter: Payer: Self-pay | Admitting: *Deleted

## 2013-08-26 ENCOUNTER — Other Ambulatory Visit: Payer: Self-pay | Admitting: Lab

## 2013-08-26 ENCOUNTER — Ambulatory Visit: Payer: Self-pay

## 2013-08-26 NOTE — Telephone Encounter (Signed)
, °

## 2013-09-06 ENCOUNTER — Other Ambulatory Visit: Payer: Self-pay | Admitting: *Deleted

## 2013-09-06 DIAGNOSIS — C50919 Malignant neoplasm of unspecified site of unspecified female breast: Secondary | ICD-10-CM

## 2013-09-06 MED ORDER — ANASTROZOLE 1 MG PO TABS: 1.0000 mg | ORAL_TABLET | Freq: Every day | ORAL | Status: DC

## 2013-09-15 ENCOUNTER — Telehealth: Payer: Self-pay | Admitting: Emergency Medicine

## 2013-09-15 NOTE — Telephone Encounter (Signed)
Patient states that she had cataract surgery on 11/11 and would like to cancel her appointments for 09/16/13. Dr Welton Flakes aware. Encouraged patient to return on 12/5 as scheduled. Patient states that she is unsure if she can make that appointment and plans to come on 12/29. Will keep 12/5 appointments schedule until further notice from patient. Advised patient to call for any concerns in the meantime.

## 2013-09-16 ENCOUNTER — Other Ambulatory Visit: Payer: Self-pay | Admitting: Lab

## 2013-09-16 ENCOUNTER — Ambulatory Visit: Payer: Self-pay | Admitting: Adult Health

## 2013-09-16 ENCOUNTER — Ambulatory Visit: Payer: Self-pay

## 2013-09-23 ENCOUNTER — Telehealth: Payer: Self-pay | Admitting: Oncology

## 2013-10-03 ENCOUNTER — Encounter (HOSPITAL_COMMUNITY): Payer: Self-pay | Admitting: Emergency Medicine

## 2013-10-03 ENCOUNTER — Emergency Department (HOSPITAL_COMMUNITY)
Admission: EM | Admit: 2013-10-03 | Discharge: 2013-10-03 | Disposition: A | Discharge status: Home / Self Care | Payer: Medicare Other | Attending: Emergency Medicine | Admitting: Emergency Medicine

## 2013-10-03 DIAGNOSIS — Z856 Personal history of leukemia: Secondary | ICD-10-CM | POA: Insufficient documentation

## 2013-10-03 DIAGNOSIS — Z85830 Personal history of malignant neoplasm of bone: Secondary | ICD-10-CM | POA: Insufficient documentation

## 2013-10-03 DIAGNOSIS — I10 Essential (primary) hypertension: Secondary | ICD-10-CM | POA: Insufficient documentation

## 2013-10-03 DIAGNOSIS — Z853 Personal history of malignant neoplasm of breast: Secondary | ICD-10-CM | POA: Insufficient documentation

## 2013-10-03 DIAGNOSIS — Z923 Personal history of irradiation: Secondary | ICD-10-CM | POA: Insufficient documentation

## 2013-10-03 DIAGNOSIS — Z79899 Other long term (current) drug therapy: Secondary | ICD-10-CM | POA: Insufficient documentation

## 2013-10-03 DIAGNOSIS — Z8669 Personal history of other diseases of the nervous system and sense organs: Secondary | ICD-10-CM | POA: Insufficient documentation

## 2013-10-03 DIAGNOSIS — IMO0002 Reserved for concepts with insufficient information to code with codable children: Secondary | ICD-10-CM | POA: Insufficient documentation

## 2013-10-03 DIAGNOSIS — M25569 Pain in unspecified knee: Secondary | ICD-10-CM | POA: Insufficient documentation

## 2013-10-03 DIAGNOSIS — M1710 Unilateral primary osteoarthritis, unspecified knee: Secondary | ICD-10-CM | POA: Insufficient documentation

## 2013-10-03 DIAGNOSIS — Z880 Allergy status to penicillin: Secondary | ICD-10-CM | POA: Insufficient documentation

## 2013-10-03 DIAGNOSIS — M25561 Pain in right knee: Secondary | ICD-10-CM

## 2013-10-03 MED ORDER — OXYCODONE-ACETAMINOPHEN 5-325 MG PO TABS: 2.0000 | ORAL_TABLET | ORAL | Status: DC | PRN

## 2013-10-03 MED ORDER — OXYCODONE-ACETAMINOPHEN 5-325 MG PO TABS
2.0000 | ORAL_TABLET | Freq: Once | ORAL | Status: AC
  Administered 2013-10-03: 2 via ORAL
  Filled 2013-10-03: qty 2

## 2013-10-03 NOTE — ED Notes (Signed)
Pt is here with bilateral knee pain with some swelling.  Pulses palpable bilaterally.  Pt states that she has breast cancer and has had chemo last in Feb.  Pt has history of arthritis.  Pt has history of bone mets to spine before

## 2013-10-03 NOTE — ED Provider Notes (Signed)
CSN: [id number]     Arrival date & time 10/03/13  1022 History   First MD Initiated Contact with Patient 10/03/13 1112     Chief Complaint  Patient presents with  . Knee Pain   (Consider location/radiation/quality/duration/timing/severity/associated sxs/prior Treatment) HPI Comments: Patient is a 78 year old female with a past medical history of severe DJD of bilateral knees who presents with bilateral knee pain since yesterday. Symptoms started gradually when she was walking yesterday and progressively worsened since the onset. The pain is aching and moderate to severe without radiation. Patient reports this pain feels like her arthritis. She denies any injury. Patient reports having Percocet prescribed by her PCP for her arthritis pain but she ran out. Patient reports associated joint swelling and some tingling in her anterior lower legs. No aggravating/alleviating factors.   Patient is a 78 y.o. female presenting with knee pain.  Knee Pain Associated symptoms: no fatigue, no fever and no neck pain     Past Medical History  Diagnosis Date  . Arthritis   . Blood transfusion 2009  . Breast CA 08/2008    (LT) breast ca dx 10/09/Chemo  . Lymphoma 09/09/2011    NHL  . Leukemia, acute, in remission 2012    Pt. not sure of type  . Hypertension   . Seizures 1980's    from medication reaction/Pt.  [name] Hx of radiation therapy 09/11/08 -10/31/08    left breast  . Metastasis to bone 07/03/2012    MRI L spine  . History of radiation therapy eot 07/30/12    lumbar spine L4 /l shoulder   Past Surgical History  Procedure Laterality Date  . Mastectomy    . Thyroidectomy  1965  . Appendectomy    . Tonsillectomy     Family History  Problem Relation Age of Onset  . Alcohol abuse Other   . Drug abuse Other   . Breast cancer Mother   . Prostate cancer Father   . Cancer Father     colon  . Colon cancer Brother   . Esophageal cancer Neg Hx   . Rectal cancer Neg Hx   . Stomach cancer Neg Hx     History  Substance Use Topics  . Smoking status: Never Smoker   . Smokeless tobacco: Never Used  . Alcohol Use: No   OB History   Grav Para Term Preterm Abortions TAB SAB Ect Mult Living                 Review of Systems  Constitutional: Negative for fever, chills and fatigue.  HENT: Negative for trouble swallowing.   Eyes: Negative for visual disturbance.  Respiratory: Negative for shortness of breath.   Cardiovascular: Negative for chest pain and palpitations.  Gastrointestinal: Negative for nausea, vomiting, abdominal pain and diarrhea.  Genitourinary: Negative for dysuria and difficulty urinating.  Musculoskeletal: Positive for arthralgias and joint swelling. Negative for neck pain.  Skin: Negative for color change.  Neurological: Negative for dizziness and weakness.  Psychiatric/Behavioral: Negative for dysphoric mood.    Allergies  Codeine; Penicillins; Sulfonamide derivatives; Tape; and Tramadol hcl  Home Medications   Current Outpatient Rx  Name  Route  Sig  Dispense  Refill  . acetaminophen (TYLENOL) 500 MG tablet   Oral   Take 500 mg by mouth every 6 (six) hours as needed for mild pain.         [name] anastrozole (ARIMIDEX) 1 MG tablet   Oral   Take 1 tablet (  1 mg total) by mouth daily.   30 tablet   5   . calcium-vitamin D (OSCAL WITH D) 500-200 MG-UNIT per tablet   Oral   Take 1 tablet by mouth daily.          [name] gatifloxacin (ZYMAXID) 0.5 % SOLN   Both Eyes   Place 1 drop into both eyes 4 (four) times daily.         . prednisoLONE acetate (PRED FORTE) 1 % ophthalmic suspension   Both Eyes   Place 1 drop into both eyes 4 (four) times daily.          BP 142/72  Pulse 66  Temp(Src) 97.5 F (36.4 C) (Oral)  Resp 18  SpO2 100% Physical Exam  Nursing note and vitals reviewed. Constitutional: She is oriented to person, place, and time. She appears well-developed and well-nourished. No distress.  HENT:  Head: Normocephalic and atraumatic.   Eyes: Conjunctivae and EOM are normal.  Neck: Normal range of motion.  Cardiovascular: Normal rate, regular rhythm and intact distal pulses.  Exam reveals no gallop and no friction rub.   No murmur heard. Pulmonary/Chest: Effort normal and breath sounds normal. She has no wheezes. She has no rales. She exhibits no tenderness.  Musculoskeletal:  Bilateral knee ROM limited due to pain and swelling. Bilateral edema noted with mild generalized tenderness to palpation. No obvious deformity. No popliteal or calf tenderness to palpation bilaterally. No lower extremity edema noted.   Neurological: She is alert and oriented to person, place, and time. Coordination normal.  Lower extremity strength and sensation equal and intact bilaterally. Speech is goal-oriented. Moves limbs without ataxia.   Skin: Skin is warm and dry.  Psychiatric: She has a normal mood and affect. Her behavior is normal.    ED Course  Procedures (including critical care time) Labs Review Labs Reviewed - No data to display Imaging Review No results found.  EKG Interpretation   None       MDM   1. Bilateral knee pain     11:50 AM Patient is a 78 year old female with a history of arthritis who presents with bilateral knee pain that started yesterday. The pain feels like her arthritis and she denies any injury. Patient will have Percocet here, which helps her with her pain, and instructions to follow up with her PCP. No neurovascular compromise. No xrays or labs needed at this time. Patient instructed to return to the ED with worsening or concerning symptoms.    Emilia Beck, PA-C 10/03/13 1159

## 2013-10-03 NOTE — ED Provider Notes (Signed)
Medical screening examination/treatment/procedure(s) were performed by non-physician practitioner and as supervising physician I was immediately available for consultation/collaboration.    Celene Kras, MD 10/03/13 [phone]

## 2013-10-03 NOTE — ED Notes (Signed)
MOVED TO POD E-REPORT TO DANIELLE

## 2013-10-03 NOTE — ED Notes (Signed)
NP ordered pt moved to acute side.

## 2013-10-03 NOTE — ED Notes (Addendum)
C/O BILATERAL KNEE PAIN AND SWELLING WITH "TINGLING"IN BOTH LOWER LEGS. STATES SHE RAN OUT OF HER PERCOCET 2 WEEKS AGO WHICH SHE GETS FROM DR. KHAN.

## 2013-10-04 NOTE — Progress Notes (Signed)
Case Manager liaised with PA Marca Ancona  patients telephone request.Patients chart reviewed in EPIC.Instructed to call patient and per PA instruction Advise her to take her prescribed  Pain medication with food.Also patient to follow up with her PCP. This Clinical research associate called patient back and updated her with  PA instruction.Patient reported she will take her OTC pain medication and  Follow up with her own PCP as instructed .Patient verbalizes her understanding of PA instructions and had  no further questions .

## 2013-10-04 NOTE — Progress Notes (Signed)
Case Manager received call from patient.Demographics verified in EPIC.Patient reports she was in the [facility] ED on 12/ 1/14 with bilateral knee pain. Patient reports she took one of  Her Prescribed medications last night Oxycodone/ Acetaminophen 5-325.Patient reports she took this without food and it made her feel nauseated.Patient reports she would like something  Less strong to be prescribed.This Case Manager verified patients allergies over the phone and verified best contact number and pharmacy contact.

## 2013-10-07 ENCOUNTER — Other Ambulatory Visit: Payer: Self-pay | Admitting: Lab

## 2013-10-07 ENCOUNTER — Ambulatory Visit: Payer: Self-pay

## 2013-10-07 ENCOUNTER — Ambulatory Visit: Payer: Self-pay | Admitting: Adult Health

## 2013-10-17 ENCOUNTER — Other Ambulatory Visit: Payer: Self-pay | Admitting: Adult Health

## 2013-10-17 ENCOUNTER — Encounter: Payer: Self-pay | Admitting: Oncology

## 2013-10-18 ENCOUNTER — Telehealth: Payer: Self-pay | Admitting: *Deleted

## 2013-10-18 ENCOUNTER — Telehealth: Payer: Self-pay | Admitting: Oncology

## 2013-10-18 NOTE — Telephone Encounter (Signed)
Per staff message and POF I have scheduled appts.  JMW  

## 2013-10-18 NOTE — Telephone Encounter (Signed)
, °

## 2013-10-31 ENCOUNTER — Telehealth: Payer: Self-pay | Admitting: *Deleted

## 2013-10-31 ENCOUNTER — Encounter: Payer: Self-pay | Admitting: Adult Health

## 2013-10-31 ENCOUNTER — Ambulatory Visit (HOSPITAL_BASED_OUTPATIENT_CLINIC_OR_DEPARTMENT_OTHER): Payer: Medicare Other | Admitting: Adult Health

## 2013-10-31 ENCOUNTER — Other Ambulatory Visit (HOSPITAL_BASED_OUTPATIENT_CLINIC_OR_DEPARTMENT_OTHER): Payer: Medicare Other

## 2013-10-31 ENCOUNTER — Ambulatory Visit: Payer: Self-pay | Admitting: Oncology

## 2013-10-31 ENCOUNTER — Telehealth: Payer: Self-pay | Admitting: Oncology

## 2013-10-31 ENCOUNTER — Other Ambulatory Visit: Payer: Self-pay | Admitting: Lab

## 2013-10-31 VITALS — BP 149/73 | HR 65 | Temp 97.9°F | Resp 18 | Ht 62.0 in | Wt 137.0 lb

## 2013-10-31 DIAGNOSIS — G893 Neoplasm related pain (acute) (chronic): Secondary | ICD-10-CM

## 2013-10-31 DIAGNOSIS — M25569 Pain in unspecified knee: Secondary | ICD-10-CM

## 2013-10-31 DIAGNOSIS — C8590 Non-Hodgkin lymphoma, unspecified, unspecified site: Secondary | ICD-10-CM

## 2013-10-31 DIAGNOSIS — C50919 Malignant neoplasm of unspecified site of unspecified female breast: Secondary | ICD-10-CM

## 2013-10-31 DIAGNOSIS — C8589 Other specified types of non-Hodgkin lymphoma, extranodal and solid organ sites: Secondary | ICD-10-CM

## 2013-10-31 DIAGNOSIS — C7951 Secondary malignant neoplasm of bone: Secondary | ICD-10-CM

## 2013-10-31 DIAGNOSIS — M25561 Pain in right knee: Secondary | ICD-10-CM

## 2013-10-31 LAB — CBC WITH DIFFERENTIAL/PLATELET
BASO%: 1 % (ref 0.0–2.0)
Basophils Absolute: 0 10*3/uL (ref 0.0–0.1)
HCT: 34.2 % — ABNORMAL LOW (ref 34.8–46.6)
HGB: 11.1 g/dL — ABNORMAL LOW (ref 11.6–15.9)
LYMPH%: 8.9 % — ABNORMAL LOW (ref 14.0–49.7)
MCHC: 32.5 g/dL (ref 31.5–36.0)
MCV: 83.8 fL (ref 79.5–101.0)
MONO#: 0.7 10*3/uL (ref 0.1–0.9)
NEUT%: 66.5 % (ref 38.4–76.8)
Platelets: 237 10*3/uL (ref 145–400)
WBC: 3.8 10*3/uL — ABNORMAL LOW (ref 3.9–10.3)
lymph#: 0.3 10*3/uL — ABNORMAL LOW (ref 0.9–3.3)

## 2013-10-31 LAB — COMPREHENSIVE METABOLIC PANEL (CC13)
ALT: 15 U/L (ref 0–55)
Anion Gap: 9 mEq/L (ref 3–11)
BUN: 10.4 mg/dL (ref 7.0–26.0)
CO2: 25 mEq/L (ref 22–29)
Calcium: 9.6 mg/dL (ref 8.4–10.4)
Chloride: 108 mEq/L (ref 98–109)
Creatinine: 0.7 mg/dL (ref 0.6–1.1)
Glucose: 95 mg/dl (ref 70–140)
Total Bilirubin: 0.22 mg/dL (ref 0.20–1.20)

## 2013-10-31 NOTE — Telephone Encounter (Signed)
, °

## 2013-10-31 NOTE — Patient Instructions (Signed)
Proceed with Rituxan tomorrow.  We will see you back on 11/21/13 for labs, evaluation and Rituxan.  We will get an x ray of your knees and likely refer you to orthopedics with results.  Please call us if you have any questions or concerns.

## 2013-10-31 NOTE — Progress Notes (Signed)
OFFICE PROGRESS NOTE  CC  Helen Linger, MD [address] [facility] [address] [place] [place] [address]  DIAGNOSIS: 78 year old female with:  #1 left breast carcinoma originally presenting as a fungating mass. Patient underwent neoadjuvant chemotherapy followed by mastectomy. She is currently on Arimidex 1mg  daily.   #2 low-grade non-Hodgkin lymphoma status post CVP and Rituxan now receiving R-Bendamustine, followed by maintenance Rituxan.   PRIOR THERAPY:  #1 the patient was diagnosed with metastatic breast carcinoma beginning October 2009. She returned her 1 chemotherapy followed by mastectomy radiation. She was on letrozole 2.5 mg daily, but has changed to Arimidex 1mg  daily.  Also receives Xgeva every 28 days.    #2 Was diagnosed with low-grade non-Hodgkin lymphoma  12. She received 6 cycles of CVP and Rituxan, followed by maintenance Rituxan, who recurred and is now on R-Bendamustine.    #3 Pt underwent 4 cycles of R-Bendamustin PET/CT on 1/10 showed CR to therapy, will complete 2 cycles of treatment and the proceed to maintenance Rituxan.  #4 Maintenance Rituximab every 3 weeks started on 01/28/13.  Last CT scans on 07/22/13 of chest/abdomen/pelvis show no recurrence of lymphoma and stable bone disease.    CURRENT THERAPY:  Arimidex daily, Xgeva (currently on hold), and Maintenance Rituximab  INTERVAL HISTORY: [place] 78 y.o. female returns for evaluation today prior to her maintenance rituximab.  She is feeling well today.  She missed her last appointment.  She is taking Arimidex daily.  She underwent a 3 teeth extractions in October, and has an appointment for several more extractions in January, 2015.  She is doing well today.  She does have increased knee pain and has been seen in the emergency room for this.  She denies any trauma, its just been steadily increasing over the past month.  Otherwise, she is doing well and a 10 point ROS is neg.   MEDICAL HISTORY: Past  Medical History  Diagnosis Date  . Arthritis   . Blood transfusion 2009  . Breast CA 08/2008    (LT) breast ca dx 10/09/Chemo  . Lymphoma 09/09/2011    NHL  . Leukemia, acute, in remission 2012    Pt. not sure of type  . Hypertension   . Seizures 1980's    from medication reaction/Pt.  [name] Hx of radiation therapy 09/11/08 -10/31/08    left breast  . Metastasis to bone 07/03/2012    MRI L spine  . History of radiation therapy eot 07/30/12    lumbar spine L4 /l shoulder    ALLERGIES:  is allergic to codeine; penicillins; sulfonamide derivatives; tape; and tramadol hcl.  MEDICATIONS:  Current Outpatient Prescriptions  Medication Sig Dispense Refill  . anastrozole (ARIMIDEX) 1 MG tablet Take 1 tablet (1 mg total) by mouth daily.  30 tablet  5  . calcium-vitamin D (OSCAL WITH D) 500-200 MG-UNIT per tablet Take 1 tablet by mouth daily.       . prednisoLONE acetate (PRED FORTE) 1 % ophthalmic suspension Place 1 drop into both eyes 4 (four) times daily.      [name] acetaminophen (TYLENOL) 500 MG tablet Take 500 mg by mouth every 6 (six) hours as needed for mild pain.      [name] gatifloxacin (ZYMAXID) 0.5 % SOLN Place 1 drop into both eyes 4 (four) times daily.      [name] oxyCODONE-acetaminophen (PERCOCET/ROXICET) 5-325 MG per tablet Take 2 tablets by mouth every 4 (four) hours as needed for severe pain.  30 tablet  0   No current facility-administered medications for this visit.    SURGICAL HISTORY:  Past Surgical History  Procedure Laterality Date  . Mastectomy    . Thyroidectomy  1965  . Appendectomy    . Tonsillectomy      REVIEW OF SYSTEMS:  A 10 point review of systems was conducted and is otherwise negative except for what is noted above.    PHYSICAL EXAMINATION:  BP 149/73  Pulse 65  Temp(Src) 97.9 F (36.6 C) (Oral)  Resp 18  Ht 5\' 2"  (1.575 m)  Wt 137 lb (62.143 kg)  BMI 25.05 kg/m2 GENERAL: Patient is a well appearing female in no acute distress HEENT:  Sclerae anicteric.   Oropharynx clear and moist. No ulcerations or evidence of oropharyngeal candidiasis. Neck is supple.  NODES:  No cervical, supraclavicular, or axillary lymphadenopathy palpated.  BREAST EXAM:  Deferred. LUNGS:  Clear to auscultation bilaterally.  No wheezes or rhonchi. HEART:  Regular rate and rhythm. No murmur appreciated. ABDOMEN:  Soft, nontender.  Positive, normoactive bowel sounds. No organomegaly palpated. MSK:  No focal spinal tenderness to palpation. Full range of motion bilaterally in the upper extremities. EXTREMITIES:  No peripheral edema.   SKIN:  Clear with no obvious rashes or skin changes. No nail dyscrasia. NEURO:  Nonfocal. Well oriented.  Appropriate affect. ECOG PERFORMANCE STATUS: 1 - Symptomatic but completely ambulatory  LABORATORY DATA: Lab Results  Component Value Date   WBC 3.8* 10/31/2013   HGB 11.1* 10/31/2013   HCT 34.2* 10/31/2013   MCV 83.8 10/31/2013   PLT 237 10/31/2013      Chemistry      Component Value Date/Time   NA 142 10/31/2013 1116   NA 136 07/06/2012 1122   NA 142 06/01/2012 1008   K 3.9 10/31/2013 1116   K 3.8 07/06/2012 1122   K 3.8 06/01/2012 1008   CL 107 04/22/2013 0922   CL 102 07/06/2012 1122   CL 103 06/01/2012 1008   CO2 25 10/31/2013 1116   CO2 20 07/06/2012 1122   CO2 25 06/01/2012 1008   BUN 10.4 10/31/2013 1116   BUN 8 07/06/2012 1122   BUN 9 06/01/2012 1008   CREATININE 0.7 10/31/2013 1116   CREATININE 0.60 07/06/2012 1122   CREATININE 0.9 06/01/2012 1008      Component Value Date/Time   CALCIUM 9.6 10/31/2013 1116   CALCIUM 9.1 07/06/2012 1122   CALCIUM 9.8 06/01/2012 1008   ALKPHOS 60 10/31/2013 1116   ALKPHOS 69 02/10/2012 1035   ALKPHOS 78 08/14/2010 1251   AST 14 10/31/2013 1116   AST 20 02/10/2012 1035   AST 19 08/14/2010 1251   ALT 15 10/31/2013 1116   ALT 19 02/10/2012 1035   ALT 9* 08/14/2010 1251   BILITOT 0.22 10/31/2013 1116   BILITOT 0.4 02/10/2012 1035   BILITOT 0.40 08/14/2010 1251       RADIOGRAPHIC  STUDIES: NUCLEAR MEDICINE PET CT SKULL BASE TO THIGH  Technique: Technique: 18.8 mCi F-18 FDG was injected  intravenously. CT data was obtained and used for attenuation  correction and anatomic localization only. (This was not acquired  as a diagnostic CT examination.) Additional exam technical data  entered on technologist worksheet.  Comparison: PET of 02/07/2011. CTs of 06/01/2012.  Findings: Neck: Hypermetabolism within the left side of the  mandible, with suspicion of concurrent periapical focal lucency on  image 24.  Bilateral cervical hypermetabolic lymph nodes. Hypermetabolism  which is primarily felt to be  nodal surrounds the right jugular  vein and extends into the thoracic inlet. This measures a S.U.V.  max of 9.9 on image 57  Chest: Hypermetabolic right axillary node. This measures 1.7 cm  and a S.U.V. max of 6.1 on image 82. On the prior PET, this node  measured 1.7 cm and a S.U.V. max of 3.1.  Small prevascular nodes which are hypermetabolic, including on  image 72.  Abdomen/Pelvis: Right adrenal hypermetabolism which is without CT  correlate. Hypermetabolic left external iliac adenopathy. This  measures 1.3 cm and a S.U.V. max of 5.0 on image 181. On the prior  PET, this node measured 1.6 cm and on the order of a S.U.V. max of  2.6.  [place]: Multifocal marrow hypermetabolism consistent with osseous  involvement. Index lesion in the right humeral head measures a  S.U.V. max of 14.4 on image 53 and is new.  A hypermetabolic lytic lesion within the left iliac wing is new or  progressive and measures a S.U.V. max of 8.1 on image 155.  CT images performed for attenuation correction demonstrate  increased number and size of lymph nodes within the neck. Index  right jugulodigastric node measures 9 mm short axis on image 34  versus 6 mm on the prior PET. Chest, abdomen, and pelvic findings  deferred to recent diagnostic CTs. Trace right-sided pleural fluid  is new. Left  upper lobe radiation fibrosis. Punctate left renal  calculi. Cholelithiasis. Fibroid uterus. L4 osseous metastasis  has possible epidural component on image 144 transverse.  IMPRESSION:  1. Increasing hypermetabolism associated with adenopathy within  the neck, chest, and pelvis, as detailed above. Findings are most  likely indicative of progression of lymphoma.  2. Progressive osseous metastasis, as detailed above. An L4 lesion  has possible epidural component. Consider pre and post contrast  lumbar spine MRI.  3. Hypermetabolism at the right lower neck and thoracic inlet  surrounds the right internal jugular vein. This appears somewhat  hyperattenuating. Cannot exclude thrombus. Consider right upper  extremity venous ultrasound with attention to this area.  4. Likely dental inflammatory left mandibular hypermetabolism.  Consider physical exam correlation.   ASSESSMENT: 78 year old female with  #1Patient with history of low-grade non-Hodgkin lymphoma she initially received CVP Rituxan and then subsequently on maintenance Rituxan, now on R Bendamustine.  She had a PET/CT on 1/10 which demonstrated response to her therapy. She will receive a total of 6 cycles R Bendamustine and the began maintenance Rituxan on 3/28.  Last CT chest/abd/pelvis on 07/22/13 showed no recurrence of lymphoma.    #2 metastatic breast carcinoma originally diagnosed in 2009 originally on letrozole 2.5 mg daily, now on Arimidex.  #3 L4 bone metastasis which is painful. Patient is now status post palliative radiation therapy.  Also receiving Xgeva q28 days (currently on hold).    #4 Eczema--intermittent and responds to oral Prednisone tapers.     PLAN:  #1  Patient is doing well today.  I reviewed her labs with her in detail.  She will proceed with Rituxan tomorrow.  We will continue daily Letrozole.    #2 I ordered xrays of knees to evaluate the pain and swelling, she needs to go see her PCP or orthopedics for  this.    #3  She will return in 3 weeks for labs, evaluation, and Ritxuan.   All questions were answered. The patient knows to call the clinic with any problems, questions or concerns. We can certainly see the patient much sooner if necessary.  I spent 25 minutes counseling the patient face to face. The total time spent in the appointment was 30 minutes.  Illa Level, NP Medical Oncology [facility] [phone] 11/01/2013, 1:50 PM

## 2013-10-31 NOTE — Telephone Encounter (Signed)
Per staff message and POF I have scheduled appts.  JMW  

## 2013-11-01 ENCOUNTER — Ambulatory Visit (HOSPITAL_BASED_OUTPATIENT_CLINIC_OR_DEPARTMENT_OTHER): Payer: Medicare Other

## 2013-11-01 ENCOUNTER — Ambulatory Visit (HOSPITAL_COMMUNITY)
Admission: RE | Admit: 2013-11-01 | Discharge: 2013-11-01 | Disposition: A | Discharge status: Home / Self Care | Payer: Medicare Other | Source: Ambulatory Visit | Attending: Adult Health | Admitting: Adult Health

## 2013-11-01 VITALS — BP 151/74 | HR 58 | Temp 97.6°F | Resp 20

## 2013-11-01 DIAGNOSIS — IMO0002 Reserved for concepts with insufficient information to code with codable children: Secondary | ICD-10-CM | POA: Insufficient documentation

## 2013-11-01 DIAGNOSIS — Z5112 Encounter for antineoplastic immunotherapy: Secondary | ICD-10-CM

## 2013-11-01 DIAGNOSIS — M1710 Unilateral primary osteoarthritis, unspecified knee: Secondary | ICD-10-CM | POA: Insufficient documentation

## 2013-11-01 DIAGNOSIS — C8590 Non-Hodgkin lymphoma, unspecified, unspecified site: Secondary | ICD-10-CM

## 2013-11-01 DIAGNOSIS — C8589 Other specified types of non-Hodgkin lymphoma, extranodal and solid organ sites: Secondary | ICD-10-CM

## 2013-11-01 DIAGNOSIS — M25561 Pain in right knee: Secondary | ICD-10-CM

## 2013-11-01 DIAGNOSIS — M25469 Effusion, unspecified knee: Secondary | ICD-10-CM | POA: Insufficient documentation

## 2013-11-01 MED ORDER — ACETAMINOPHEN 325 MG PO TABS
650.0000 mg | ORAL_TABLET | Freq: Once | ORAL | Status: AC
  Administered 2013-11-01: 650 mg via ORAL

## 2013-11-01 MED ORDER — SODIUM CHLORIDE 0.9 % IJ SOLN
10.0000 mL | INTRAMUSCULAR | Status: DC | PRN
  Administered 2013-11-01: 10 mL
  Filled 2013-11-01: qty 10

## 2013-11-01 MED ORDER — SODIUM CHLORIDE 0.9 % IV SOLN
Freq: Once | INTRAVENOUS | Status: AC
  Administered 2013-11-01: 14:00:00 via INTRAVENOUS

## 2013-11-01 MED ORDER — HEPARIN SOD (PORK) LOCK FLUSH 100 UNIT/ML IV SOLN
500.0000 [IU] | Freq: Once | INTRAVENOUS | Status: AC | PRN
  Administered 2013-11-01: 500 [IU]
  Filled 2013-11-01: qty 5

## 2013-11-01 MED ORDER — ACETAMINOPHEN 325 MG PO TABS
ORAL_TABLET | ORAL | Status: AC
  Filled 2013-11-01: qty 2

## 2013-11-01 MED ORDER — SODIUM CHLORIDE 0.9 % IV SOLN
375.0000 mg/m2 | Freq: Once | INTRAVENOUS | Status: AC
  Administered 2013-11-01: 600 mg via INTRAVENOUS
  Filled 2013-11-01: qty 60

## 2013-11-01 MED ORDER — DIPHENHYDRAMINE HCL 25 MG PO CAPS
ORAL_CAPSULE | ORAL | Status: AC
  Filled 2013-11-01: qty 2

## 2013-11-01 MED ORDER — DIPHENHYDRAMINE HCL 25 MG PO CAPS
50.0000 mg | ORAL_CAPSULE | Freq: Once | ORAL | Status: AC
  Administered 2013-11-01: 50 mg via ORAL

## 2013-11-01 NOTE — Patient Instructions (Signed)
[facility] Discharge Instructions for Patients Receiving Chemotherapy  Today you received the following chemotherapy agents:  Rituxan  To help prevent nausea and vomiting after your treatment, we encourage you to take your nausea medication as ordered per MD.   If you develop nausea and vomiting that is not controlled by your nausea medication, call the clinic.   BELOW ARE SYMPTOMS THAT SHOULD BE REPORTED IMMEDIATELY:  *FEVER GREATER THAN 100.5 F  *CHILLS WITH OR WITHOUT FEVER  NAUSEA AND VOMITING THAT IS NOT CONTROLLED WITH YOUR NAUSEA MEDICATION  *UNUSUAL SHORTNESS OF BREATH  *UNUSUAL BRUISING OR BLEEDING  TENDERNESS IN MOUTH AND THROAT WITH OR WITHOUT PRESENCE OF ULCERS  *URINARY PROBLEMS  *BOWEL PROBLEMS  UNUSUAL RASH Items with * indicate a potential emergency and should be followed up as soon as possible.  Feel free to call the clinic you have any questions or concerns. The clinic phone number is [phone].    

## 2013-11-21 ENCOUNTER — Other Ambulatory Visit: Payer: Self-pay | Admitting: *Deleted

## 2013-11-21 DIAGNOSIS — C8590 Non-Hodgkin lymphoma, unspecified, unspecified site: Secondary | ICD-10-CM

## 2013-11-22 ENCOUNTER — Ambulatory Visit: Payer: Self-pay | Admitting: Adult Health

## 2013-11-22 ENCOUNTER — Telehealth: Payer: Self-pay | Admitting: *Deleted

## 2013-11-22 ENCOUNTER — Ambulatory Visit (HOSPITAL_BASED_OUTPATIENT_CLINIC_OR_DEPARTMENT_OTHER): Payer: Medicare Other

## 2013-11-22 ENCOUNTER — Other Ambulatory Visit: Payer: Self-pay

## 2013-11-22 ENCOUNTER — Encounter: Payer: Self-pay | Admitting: Oncology

## 2013-11-22 ENCOUNTER — Ambulatory Visit (HOSPITAL_BASED_OUTPATIENT_CLINIC_OR_DEPARTMENT_OTHER): Payer: Medicare Other | Admitting: Oncology

## 2013-11-22 ENCOUNTER — Other Ambulatory Visit (HOSPITAL_BASED_OUTPATIENT_CLINIC_OR_DEPARTMENT_OTHER): Payer: Medicare Other

## 2013-11-22 ENCOUNTER — Telehealth: Payer: Self-pay | Admitting: Oncology

## 2013-11-22 VITALS — BP 155/56 | HR 62 | Temp 98.2°F | Resp 18

## 2013-11-22 VITALS — BP 159/76 | HR 67 | Temp 97.4°F | Resp 18 | Ht 62.0 in | Wt 137.9 lb

## 2013-11-22 DIAGNOSIS — C8589 Other specified types of non-Hodgkin lymphoma, extranodal and solid organ sites: Secondary | ICD-10-CM

## 2013-11-22 DIAGNOSIS — L259 Unspecified contact dermatitis, unspecified cause: Secondary | ICD-10-CM

## 2013-11-22 DIAGNOSIS — C8590 Non-Hodgkin lymphoma, unspecified, unspecified site: Secondary | ICD-10-CM

## 2013-11-22 DIAGNOSIS — C50919 Malignant neoplasm of unspecified site of unspecified female breast: Secondary | ICD-10-CM

## 2013-11-22 DIAGNOSIS — C7952 Secondary malignant neoplasm of bone marrow: Secondary | ICD-10-CM

## 2013-11-22 DIAGNOSIS — C7951 Secondary malignant neoplasm of bone: Secondary | ICD-10-CM

## 2013-11-22 DIAGNOSIS — Z853 Personal history of malignant neoplasm of breast: Secondary | ICD-10-CM

## 2013-11-22 DIAGNOSIS — Z5112 Encounter for antineoplastic immunotherapy: Secondary | ICD-10-CM

## 2013-11-22 LAB — CBC WITH DIFFERENTIAL/PLATELET
BASO%: 1 % (ref 0.0–2.0)
Basophils Absolute: 0 10*3/uL (ref 0.0–0.1)
EOS%: 6.5 % (ref 0.0–7.0)
Eosinophils Absolute: 0.3 10*3/uL (ref 0.0–0.5)
HEMATOCRIT: 37.5 % (ref 34.8–46.6)
HGB: 12.1 g/dL (ref 11.6–15.9)
LYMPH%: 14.5 % (ref 14.0–49.7)
MCH: 27.4 pg (ref 25.1–34.0)
MCHC: 32.3 g/dL (ref 31.5–36.0)
MCV: 85 fL (ref 79.5–101.0)
MONO#: 0.4 10*3/uL (ref 0.1–0.9)
MONO%: 8.7 % (ref 0.0–14.0)
NEUT#: 2.9 10*3/uL (ref 1.5–6.5)
NEUT%: 69.3 % (ref 38.4–76.8)
NRBC: 0 % (ref 0–0)
PLATELETS: 233 10*3/uL (ref 145–400)
RBC: 4.41 10*6/uL (ref 3.70–5.45)
RDW: 16.1 % — ABNORMAL HIGH (ref 11.2–14.5)
WBC: 4.1 10*3/uL (ref 3.9–10.3)
lymph#: 0.6 10*3/uL — ABNORMAL LOW (ref 0.9–3.3)

## 2013-11-22 LAB — COMPREHENSIVE METABOLIC PANEL (CC13)
ALK PHOS: 63 U/L (ref 40–150)
ALT: 17 U/L (ref 0–55)
AST: 19 U/L (ref 5–34)
Albumin: 4 g/dL (ref 3.5–5.0)
Anion Gap: 10 mEq/L (ref 3–11)
BILIRUBIN TOTAL: 0.3 mg/dL (ref 0.20–1.20)
BUN: 9.7 mg/dL (ref 7.0–26.0)
CO2: 24 mEq/L (ref 22–29)
Calcium: 10.2 mg/dL (ref 8.4–10.4)
Chloride: 107 mEq/L (ref 98–109)
Creatinine: 0.8 mg/dL (ref 0.6–1.1)
Glucose: 92 mg/dl (ref 70–140)
Potassium: 3.7 mEq/L (ref 3.5–5.1)
Sodium: 141 mEq/L (ref 136–145)
Total Protein: 7.3 g/dL (ref 6.4–8.3)

## 2013-11-22 MED ORDER — HEPARIN SOD (PORK) LOCK FLUSH 100 UNIT/ML IV SOLN
500.0000 [IU] | Freq: Once | INTRAVENOUS | Status: AC | PRN
  Administered 2013-11-22: 500 [IU]
  Filled 2013-11-22: qty 5

## 2013-11-22 MED ORDER — SODIUM CHLORIDE 0.9 % IV SOLN
Freq: Once | INTRAVENOUS | Status: AC
  Administered 2013-11-22: 11:00:00 via INTRAVENOUS

## 2013-11-22 MED ORDER — ACETAMINOPHEN 325 MG PO TABS
ORAL_TABLET | ORAL | Status: AC
  Filled 2013-11-22: qty 2

## 2013-11-22 MED ORDER — SODIUM CHLORIDE 0.9 % IV SOLN
375.0000 mg/m2 | Freq: Once | INTRAVENOUS | Status: AC
  Administered 2013-11-22: 600 mg via INTRAVENOUS
  Filled 2013-11-22: qty 60

## 2013-11-22 MED ORDER — ACETAMINOPHEN 325 MG PO TABS
650.0000 mg | ORAL_TABLET | Freq: Once | ORAL | Status: AC
  Administered 2013-11-22: 650 mg via ORAL

## 2013-11-22 MED ORDER — SODIUM CHLORIDE 0.9 % IJ SOLN
10.0000 mL | INTRAMUSCULAR | Status: DC | PRN
  Administered 2013-11-22: 10 mL
  Filled 2013-11-22: qty 10

## 2013-11-22 MED ORDER — DIPHENHYDRAMINE HCL 25 MG PO CAPS
50.0000 mg | ORAL_CAPSULE | Freq: Once | ORAL | Status: AC
  Administered 2013-11-22: 50 mg via ORAL

## 2013-11-22 MED ORDER — DIPHENHYDRAMINE HCL 25 MG PO CAPS
ORAL_CAPSULE | ORAL | Status: AC
  Filled 2013-11-22: qty 2

## 2013-11-22 NOTE — Telephone Encounter (Signed)
Per staff message and POF I have scheduled appts. MD appt on 2/10 to late to appt treatment after appt. Scheduler advised  Helen [name]

## 2013-11-22 NOTE — Progress Notes (Signed)
OFFICE PROGRESS NOTE  CC  Helen Calico, MD [address] [facility] [place], [address] [place] [address]  DIAGNOSIS: 79 year old female with:  #1 left breast carcinoma originally presenting as a fungating mass. Patient underwent neoadjuvant chemotherapy followed by mastectomy. She is currently on Arimidex 1mg  daily.   #2 low-grade non-Hodgkin lymphoma status post CVP and Rituxan now receiving R-Bendamustine, followed by maintenance Rituxan.   PRIOR THERAPY:  #1 the patient was diagnosed with metastatic breast carcinoma beginning October 2009. She returned her 1 chemotherapy followed by mastectomy radiation. She was on letrozole 2.5 mg daily, but has changed to Arimidex 1mg  daily.  Also receives Xgeva every 28 days.    #2 Was diagnosed with low-grade non-Hodgkin lymphoma  12. She received 6 cycles of CVP and Rituxan, followed by maintenance Rituxan, who recurred and is now on R-Bendamustine.    #3 Pt underwent 4 cycles of R-Bendamustin PET/CT on 1/10 showed CR to therapy, will complete 2 cycles of treatment and the proceed to maintenance Rituxan.  #4 Maintenance Rituximab every 3 weeks started on 01/28/13.  Last CT scans on 07/22/13 of chest/abdomen/pelvis show no recurrence of lymphoma and stable bone disease.    CURRENT THERAPY:  Arimidex daily, Xgeva (currently on hold), and Maintenance Rituximab  INTERVAL HISTORY: Helen [name] 79 y.o. female returns for evaluation today prior to her maintenance rituximab.  She is feeling well today.    She is taking Arimidex daily. Patient will be having more dental extractions on January 26. We therefore will continue to hold her xgeva. She is doing well today.  Patient's knee pain is still there but more tolerable. She does not have any back pain currently.  She denies any trauma, its just been steadily increasing over the past month.  Otherwise, she is doing well and a 10 point ROS is neg.   MEDICAL HISTORY: Past Medical History  Diagnosis  Date  . Arthritis   . Blood transfusion 2009  . Breast CA 08/2008    (LT) breast ca dx 10/09/Chemo  . Lymphoma 09/09/2011    NHL  . Leukemia, acute, in remission 2012    Pt. not sure of type  . Hypertension   . Seizures 1980's    from medication reaction/Pt.  [name] Hx of radiation therapy 09/11/08 -10/31/08    left breast  . Metastasis to bone 07/03/2012    MRI L spine  . History of radiation therapy eot 07/30/12    lumbar spine L4 /l shoulder    ALLERGIES:  is allergic to codeine; penicillins; sulfonamide derivatives; tape; and tramadol hcl.  MEDICATIONS:  Current Outpatient Prescriptions  Medication Sig Dispense Refill  . anastrozole (ARIMIDEX) 1 MG tablet Take 1 tablet (1 mg total) by mouth daily.  30 tablet  5  . calcium-vitamin D (OSCAL WITH D) 500-200 MG-UNIT per tablet Take 1 tablet by mouth daily.       [name] gatifloxacin (ZYMAXID) 0.5 % SOLN Place 1 drop into both eyes 4 (four) times daily.      . prednisoLONE acetate (PRED FORTE) 1 % ophthalmic suspension Place 1 drop into both eyes 4 (four) times daily.      [name] acetaminophen (TYLENOL) 500 MG tablet Take 500 mg by mouth every 6 (six) hours as needed for mild pain.      [name] oxyCODONE-acetaminophen (PERCOCET/ROXICET) 5-325 MG per tablet Take 2 tablets by mouth every 4 (four) hours as needed for severe pain.  30 tablet  0  No current facility-administered medications for this visit.    SURGICAL HISTORY:  Past Surgical History  Procedure Laterality Date  . Mastectomy    . Thyroidectomy  1965  . Appendectomy    . Tonsillectomy      REVIEW OF SYSTEMS:  A 10 point review of systems was conducted and is otherwise negative except for what is noted above.    PHYSICAL EXAMINATION:  BP 159/76  Pulse 67  Temp(Src) 97.4 F (36.3 C) (Oral)  Resp 18  Ht 5\' 2"  (1.575 m)  Wt 137 lb 14.4 oz (62.551 kg)  BMI 25.22 kg/m2 GENERAL: Patient is a well appearing female in no acute distress HEENT:  Sclerae anicteric.  Oropharynx clear and  moist. No ulcerations or evidence of oropharyngeal candidiasis. Neck is supple.  NODES:  No cervical, supraclavicular, or axillary lymphadenopathy palpated.  BREAST EXAM:  Deferred. LUNGS:  Clear to auscultation bilaterally.  No wheezes or rhonchi. HEART:  Regular rate and rhythm. No murmur appreciated. ABDOMEN:  Soft, nontender.  Positive, normoactive bowel sounds. No organomegaly palpated. MSK:  No focal spinal tenderness to palpation. Full range of motion bilaterally in the upper extremities. EXTREMITIES:  No peripheral edema.   SKIN:  Clear with no obvious rashes or skin changes. No nail dyscrasia. NEURO:  Nonfocal. Well oriented.  Appropriate affect. ECOG PERFORMANCE STATUS: 1 - Symptomatic but completely ambulatory  LABORATORY DATA: Lab Results  Component Value Date   WBC 4.1 11/22/2013   HGB 12.1 11/22/2013   HCT 37.5 11/22/2013   MCV 85.0 11/22/2013   PLT 233 11/22/2013      Chemistry      Component Value Date/Time   NA 142 10/31/2013 1116   NA 136 07/06/2012 1122   NA 142 06/01/2012 1008   K 3.9 10/31/2013 1116   K 3.8 07/06/2012 1122   K 3.8 06/01/2012 1008   CL 107 04/22/2013 0922   CL 102 07/06/2012 1122   CL 103 06/01/2012 1008   CO2 25 10/31/2013 1116   CO2 20 07/06/2012 1122   CO2 25 06/01/2012 1008   BUN 10.4 10/31/2013 1116   BUN 8 07/06/2012 1122   BUN 9 06/01/2012 1008   CREATININE 0.7 10/31/2013 1116   CREATININE 0.60 07/06/2012 1122   CREATININE 0.9 06/01/2012 1008      Component Value Date/Time   CALCIUM 9.6 10/31/2013 1116   CALCIUM 9.1 07/06/2012 1122   CALCIUM 9.8 06/01/2012 1008   ALKPHOS 60 10/31/2013 1116   ALKPHOS 69 02/10/2012 1035   ALKPHOS 78 08/14/2010 1251   AST 14 10/31/2013 1116   AST 20 02/10/2012 1035   AST 19 08/14/2010 1251   ALT 15 10/31/2013 1116   ALT 19 02/10/2012 1035   ALT 9* 08/14/2010 1251   BILITOT 0.22 10/31/2013 1116   BILITOT 0.4 02/10/2012 1035   BILITOT 0.40 08/14/2010 1251       RADIOGRAPHIC STUDIES: NUCLEAR MEDICINE PET CT SKULL  BASE TO THIGH  Technique: Technique: 18.8 mCi F-18 FDG was injected  intravenously. CT data was obtained and used for attenuation  correction and anatomic localization only. (This was not acquired  as a diagnostic CT examination.) Additional exam technical data  entered on technologist worksheet.  Comparison: PET of 02/07/2011. CTs of 06/01/2012.  Findings: Neck: Hypermetabolism within the left side of the  mandible, with suspicion of concurrent periapical focal lucency on  image 24.  Bilateral cervical hypermetabolic lymph nodes. Hypermetabolism  which is primarily felt to be nodal surrounds the right  jugular  vein and extends into the thoracic inlet. This measures a S.U.V.  max of 9.9 on image 57  Chest: Hypermetabolic right axillary node. This measures 1.7 cm  and a S.U.V. max of 6.1 on image 82. On the prior PET, this node  measured 1.7 cm and a S.U.V. max of 3.1.  Small prevascular nodes which are hypermetabolic, including on  image 72.  Abdomen/Pelvis: Right adrenal hypermetabolism which is without CT  correlate. Hypermetabolic left external iliac adenopathy. This  measures 1.3 cm and a S.U.V. max of 5.0 on image 181. On the prior  PET, this node measured 1.6 cm and on the order of a S.U.V. max of  2.6.  Skelton: Multifocal marrow hypermetabolism consistent with osseous  involvement. Index lesion in the right humeral head measures a  S.U.V. max of 14.4 on image 53 and is new.  A hypermetabolic lytic lesion within the left iliac wing is new or  progressive and measures a S.U.V. max of 8.1 on image 155.  CT images performed for attenuation correction demonstrate  increased number and size of lymph nodes within the neck. Index  right jugulodigastric node measures 9 mm short axis on image 34  versus 6 mm on the prior PET. Chest, abdomen, and pelvic findings  deferred to recent diagnostic CTs. Trace right-sided pleural fluid  is new. Left upper lobe radiation fibrosis. Punctate  left renal  calculi. Cholelithiasis. Fibroid uterus. L4 osseous metastasis  has possible epidural component on image 144 transverse.  IMPRESSION:  1. Increasing hypermetabolism associated with adenopathy within  the neck, chest, and pelvis, as detailed above. Findings are most  likely indicative of progression of lymphoma.  2. Progressive osseous metastasis, as detailed above. An L4 lesion  has possible epidural component. Consider pre and post contrast  lumbar spine MRI.  3. Hypermetabolism at the right lower neck and thoracic inlet  surrounds the right internal jugular vein. This appears somewhat  hyperattenuating. Cannot exclude thrombus. Consider right upper  extremity venous ultrasound with attention to this area.  4. Likely dental inflammatory left mandibular hypermetabolism.  Consider physical exam correlation.   ASSESSMENT: 79 year old female with  #1Patient with history of low-grade non-Hodgkin lymphoma she initially received CVP Rituxan and then subsequently on maintenance Rituxan, now on R Bendamustine.  She had a PET/CT on 1/10 which demonstrated response to her therapy. She will receive a total of 6 cycles R Bendamustine and the began maintenance Rituxan on 3/28.  Last CT chest/abd/pelvis on 07/22/13 showed no recurrence of lymphoma.    #2 metastatic breast carcinoma originally diagnosed in 2009 originally on letrozole 2.5 mg daily, now on Arimidex.  #3 L4 bone metastasis which is painful. Patient is now status post palliative radiation therapy.  Also receiving Xgeva q28 days (currently on hold).    #4 Eczema--intermittent and responds to oral Prednisone tapers.     PLAN:  #1 patient will proceed with scheduled Rituxan today. Risks benefits and side effects of treatment are discussed with her again.  #2 metastatic breast cancer: She will continue Arimidex 1 mg daily.  #3 bone metastasis: Patient has been on xgeva in the past. However due to her dental work we are holding  this for now until we get her clearance from her oral surgeon.  #4 patient will need to have restaging scans performed I will plan on getting this set up after her next cycle of Rituxan.  #5 patient will be seen back on Dec 13 2013 for followup and next  treatment  All questions were answered. The patient knows to call the clinic with any problems, questions or concerns. We can certainly see the patient much sooner if necessary.  I spent 25 minutes counseling the patient face to face. The total time spent in the appointment was 30 minutes.  Marcy Panning, MD Medical/Oncology [facility] [phone] (beeper) [phone] (Office)  11/22/2013, 9:24 AM

## 2013-11-22 NOTE — Patient Instructions (Signed)
[facility] Discharge Instructions for Patients Receiving Chemotherapy  Today you received the following chemotherapy agents rituxan.     If you develop nausea and vomiting , call the clinic.   BELOW ARE SYMPTOMS THAT SHOULD BE REPORTED IMMEDIATELY:  *FEVER GREATER THAN 100.5 F  *CHILLS WITH OR WITHOUT FEVER  NAUSEA AND VOMITING THAT IS NOT CONTROLLED WITH YOUR NAUSEA MEDICATION  *UNUSUAL SHORTNESS OF BREATH  *UNUSUAL BRUISING OR BLEEDING  TENDERNESS IN MOUTH AND THROAT WITH OR WITHOUT PRESENCE OF ULCERS  *URINARY PROBLEMS  *BOWEL PROBLEMS  UNUSUAL RASH Items with * indicate a potential emergency and should be followed up as soon as possible.  Feel free to call the clinic you have any questions or concerns. The clinic phone number is [phone].    

## 2013-11-23 ENCOUNTER — Telehealth: Payer: Self-pay | Admitting: *Deleted

## 2013-11-23 NOTE — Telephone Encounter (Signed)
Per staff message and POF I have scheduled appts.  JMW  

## 2013-12-13 ENCOUNTER — Telehealth: Payer: Self-pay | Admitting: *Deleted

## 2013-12-13 ENCOUNTER — Ambulatory Visit (HOSPITAL_BASED_OUTPATIENT_CLINIC_OR_DEPARTMENT_OTHER): Payer: Medicare Other | Admitting: Oncology

## 2013-12-13 ENCOUNTER — Encounter: Payer: Self-pay | Admitting: Oncology

## 2013-12-13 ENCOUNTER — Encounter (INDEPENDENT_AMBULATORY_CARE_PROVIDER_SITE_OTHER): Payer: Self-pay

## 2013-12-13 ENCOUNTER — Other Ambulatory Visit (HOSPITAL_BASED_OUTPATIENT_CLINIC_OR_DEPARTMENT_OTHER): Payer: Medicare Other

## 2013-12-13 ENCOUNTER — Other Ambulatory Visit: Payer: Self-pay

## 2013-12-13 ENCOUNTER — Ambulatory Visit: Payer: Self-pay | Admitting: Oncology

## 2013-12-13 ENCOUNTER — Ambulatory Visit (HOSPITAL_BASED_OUTPATIENT_CLINIC_OR_DEPARTMENT_OTHER): Payer: Medicare Other

## 2013-12-13 VITALS — BP 123/69 | HR 65 | Temp 97.8°F | Resp 16

## 2013-12-13 VITALS — BP 125/73 | HR 78 | Temp 98.1°F | Resp 20 | Ht 62.0 in | Wt 137.9 lb

## 2013-12-13 DIAGNOSIS — C7952 Secondary malignant neoplasm of bone marrow: Secondary | ICD-10-CM

## 2013-12-13 DIAGNOSIS — C7951 Secondary malignant neoplasm of bone: Secondary | ICD-10-CM

## 2013-12-13 DIAGNOSIS — C8589 Other specified types of non-Hodgkin lymphoma, extranodal and solid organ sites: Secondary | ICD-10-CM

## 2013-12-13 DIAGNOSIS — Z853 Personal history of malignant neoplasm of breast: Secondary | ICD-10-CM

## 2013-12-13 DIAGNOSIS — C8590 Non-Hodgkin lymphoma, unspecified, unspecified site: Secondary | ICD-10-CM

## 2013-12-13 DIAGNOSIS — Z5112 Encounter for antineoplastic immunotherapy: Secondary | ICD-10-CM

## 2013-12-13 DIAGNOSIS — C50919 Malignant neoplasm of unspecified site of unspecified female breast: Secondary | ICD-10-CM

## 2013-12-13 DIAGNOSIS — L259 Unspecified contact dermatitis, unspecified cause: Secondary | ICD-10-CM

## 2013-12-13 LAB — COMPREHENSIVE METABOLIC PANEL (CC13)
ALBUMIN: 4 g/dL (ref 3.5–5.0)
ALT: 13 U/L (ref 0–55)
AST: 18 U/L (ref 5–34)
Alkaline Phosphatase: 68 U/L (ref 40–150)
Anion Gap: 9 mEq/L (ref 3–11)
BUN: 9.8 mg/dL (ref 7.0–26.0)
CALCIUM: 10.4 mg/dL (ref 8.4–10.4)
CHLORIDE: 106 meq/L (ref 98–109)
CO2: 26 mEq/L (ref 22–29)
Creatinine: 0.8 mg/dL (ref 0.6–1.1)
GLUCOSE: 87 mg/dL (ref 70–140)
POTASSIUM: 3.9 meq/L (ref 3.5–5.1)
Sodium: 141 mEq/L (ref 136–145)
Total Bilirubin: 0.2 mg/dL (ref 0.20–1.20)
Total Protein: 6.9 g/dL (ref 6.4–8.3)

## 2013-12-13 LAB — CBC WITH DIFFERENTIAL/PLATELET
BASO%: 0.5 % (ref 0.0–2.0)
Basophils Absolute: 0 10*3/uL (ref 0.0–0.1)
EOS%: 5.8 % (ref 0.0–7.0)
Eosinophils Absolute: 0.2 10*3/uL (ref 0.0–0.5)
HEMATOCRIT: 35.9 % (ref 34.8–46.6)
HEMOGLOBIN: 11.5 g/dL — AB (ref 11.6–15.9)
LYMPH#: 0.6 10*3/uL — AB (ref 0.9–3.3)
LYMPH%: 13.8 % — ABNORMAL LOW (ref 14.0–49.7)
MCH: 27.4 pg (ref 25.1–34.0)
MCHC: 32 g/dL (ref 31.5–36.0)
MCV: 85.5 fL (ref 79.5–101.0)
MONO#: 0.4 10*3/uL (ref 0.1–0.9)
MONO%: 10.8 % (ref 0.0–14.0)
NEUT#: 2.8 10*3/uL (ref 1.5–6.5)
NEUT%: 69.1 % (ref 38.4–76.8)
Platelets: 239 10*3/uL (ref 145–400)
RBC: 4.2 10*6/uL (ref 3.70–5.45)
RDW: 15.1 % — ABNORMAL HIGH (ref 11.2–14.5)
WBC: 4 10*3/uL (ref 3.9–10.3)

## 2013-12-13 LAB — LACTATE DEHYDROGENASE (CC13): LDH: 274 U/L — ABNORMAL HIGH (ref 125–245)

## 2013-12-13 MED ORDER — SODIUM CHLORIDE 0.9 % IJ SOLN
10.0000 mL | INTRAMUSCULAR | Status: DC | PRN
  Administered 2013-12-13: 10 mL
  Filled 2013-12-13: qty 10

## 2013-12-13 MED ORDER — SODIUM CHLORIDE 0.9 % IV SOLN
Freq: Once | INTRAVENOUS | Status: AC
  Administered 2013-12-13: 10:00:00 via INTRAVENOUS

## 2013-12-13 MED ORDER — ACETAMINOPHEN 325 MG PO TABS
650.0000 mg | ORAL_TABLET | Freq: Once | ORAL | Status: AC
  Administered 2013-12-13: 650 mg via ORAL

## 2013-12-13 MED ORDER — DIPHENHYDRAMINE HCL 25 MG PO CAPS
ORAL_CAPSULE | ORAL | Status: AC
  Filled 2013-12-13: qty 2

## 2013-12-13 MED ORDER — DIPHENHYDRAMINE HCL 25 MG PO CAPS
50.0000 mg | ORAL_CAPSULE | Freq: Once | ORAL | Status: AC
  Administered 2013-12-13: 50 mg via ORAL

## 2013-12-13 MED ORDER — SODIUM CHLORIDE 0.9 % IV SOLN
375.0000 mg/m2 | Freq: Once | INTRAVENOUS | Status: AC
  Administered 2013-12-13: 600 mg via INTRAVENOUS
  Filled 2013-12-13: qty 60

## 2013-12-13 MED ORDER — HEPARIN SOD (PORK) LOCK FLUSH 100 UNIT/ML IV SOLN
500.0000 [IU] | Freq: Once | INTRAVENOUS | Status: AC | PRN
  Administered 2013-12-13: 500 [IU]
  Filled 2013-12-13: qty 5

## 2013-12-13 MED ORDER — ACETAMINOPHEN 325 MG PO TABS
ORAL_TABLET | ORAL | Status: AC
  Filled 2013-12-13: qty 2

## 2013-12-13 NOTE — Telephone Encounter (Signed)
appts made and printed. Pt is aware that tx will be added. i emailed MW to add the tx...td 

## 2013-12-13 NOTE — Telephone Encounter (Signed)
Per staff message and POF I have scheduled appts.  JMW  

## 2013-12-13 NOTE — Progress Notes (Signed)
OFFICE PROGRESS NOTE  CC  [name], MD [address], [address] [place] [address]  DIAGNOSIS: 79 year old female with:  #1 left breast carcinoma originally presenting as a fungating mass. Patient underwent neoadjuvant chemotherapy followed by mastectomy. She is currently on Arimidex 1mg  daily.   #2 low-grade non-Hodgkin lymphoma status post CVP and Rituxan now receiving R-Bendamustine, followed by maintenance Rituxan.   PRIOR THERAPY:  #1 the patient was diagnosed with metastatic breast carcinoma beginning October 2009. She returned her 1 chemotherapy followed by mastectomy radiation. She was on letrozole 2.5 mg daily, but has changed to Arimidex 1mg  daily.  Also receives Xgeva every 28 days.    #2 Was diagnosed with low-grade non-Hodgkin lymphoma  12. She received 6 cycles of CVP and Rituxan, followed by maintenance Rituxan, who recurred and is now on R-Bendamustine.    #3 Pt underwent 4 cycles of R-Bendamustin PET/CT on 1/10 showed CR to therapy, will complete 2 cycles of treatment and the proceed to maintenance Rituxan.  #4 Maintenance Rituximab every 3 weeks started on 01/28/13.  Last CT scans on 07/22/13 of chest/abdomen/pelvis show no recurrence of lymphoma and stable bone disease.    CURRENT THERAPY:  Arimidex daily, Xgeva (currently on hold), and Maintenance Rituximab  INTERVAL HISTORY: Helen [name] 79 y.o. female returns for evaluation today prior to her maintenance rituximab.  She is feeling well today.    She is taking Arimidex daily. Patient will be having more dental extractions on January 26. We therefore will continue to hold her xgeva. She is doing well today.  Patient's knee pain is still there but more tolerable. She does not have any back pain currently.  She denies any trauma, its just been steadily increasing over the past month.  Otherwise, she is doing well and a 10 point ROS is neg.   MEDICAL HISTORY: Past Medical History  Diagnosis  Date  . Arthritis   . Blood transfusion 2009  . Breast CA 08/2008    (LT) breast ca dx 10/09/Chemo  . Lymphoma 09/09/2011    NHL  . Leukemia, acute, in remission 2012    Pt. not sure of type  . Hypertension   . Seizures 1980's    from medication reaction/Pt.  [name] Hx of radiation therapy 09/11/08 -10/31/08    left breast  . Metastasis to bone 07/03/2012    MRI L spine  . History of radiation therapy eot 07/30/12    lumbar spine L4 /l shoulder    ALLERGIES:  is allergic to codeine; penicillins; sulfonamide derivatives; tape; and tramadol hcl.  MEDICATIONS:  Current Outpatient Prescriptions  Medication Sig Dispense Refill  . acetaminophen (TYLENOL) 500 MG tablet Take 500 mg by mouth every 6 (six) hours as needed for mild pain.      [name] anastrozole (ARIMIDEX) 1 MG tablet Take 1 tablet (1 mg total) by mouth daily.  30 tablet  5  . calcium-vitamin D (OSCAL WITH D) 500-200 MG-UNIT per tablet Take 1 tablet by mouth daily.       [name] gatifloxacin (ZYMAXID) 0.5 % SOLN Place 1 drop into both eyes 4 (four) times daily.      [name] oxyCODONE-acetaminophen (PERCOCET/ROXICET) 5-325 MG per tablet Take 2 tablets by mouth every 4 (four) hours as needed for severe pain.  30 tablet  0  . prednisoLONE acetate (PRED FORTE) 1 % ophthalmic suspension Place 1 drop into both eyes 4 (four) times daily.  No current facility-administered medications for this visit.    SURGICAL HISTORY:  Past Surgical History  Procedure Laterality Date  . Mastectomy    . Thyroidectomy  1965  . Appendectomy    . Tonsillectomy      REVIEW OF SYSTEMS:  A 10 point review of systems was conducted and is otherwise negative except for what is noted above.    PHYSICAL EXAMINATION:  BP 125/73  Pulse 78  Temp(Src) 98.1 F (36.7 C) (Oral)  Resp 20  Ht 5\' 2"  (1.575 m)  Wt 137 lb 14.4 oz (62.551 kg)  BMI 25.22 kg/m2 GENERAL: Patient is a well appearing female in no acute distress HEENT:  Sclerae anicteric.  Oropharynx clear and  moist. No ulcerations or evidence of oropharyngeal candidiasis. Neck is supple.  NODES:  No cervical, supraclavicular, or axillary lymphadenopathy palpated.  BREAST EXAM:  Deferred. LUNGS:  Clear to auscultation bilaterally.  No wheezes or rhonchi. HEART:  Regular rate and rhythm. No murmur appreciated. ABDOMEN:  Soft, nontender.  Positive, normoactive bowel sounds. No organomegaly palpated. MSK:  No focal spinal tenderness to palpation. Full range of motion bilaterally in the upper extremities. EXTREMITIES:  No peripheral edema.   SKIN:  Clear with no obvious rashes or skin changes. No nail dyscrasia. NEURO:  Nonfocal. Well oriented.  Appropriate affect. ECOG PERFORMANCE STATUS: 1 - Symptomatic but completely ambulatory  LABORATORY DATA: Lab Results  Component Value Date   WBC 4.0 12/13/2013   HGB 11.5* 12/13/2013   HCT 35.9 12/13/2013   MCV 85.5 12/13/2013   PLT 239 12/13/2013      Chemistry      Component Value Date/Time   NA 141 11/22/2013 0846   NA 136 07/06/2012 1122   NA 142 06/01/2012 1008   K 3.7 11/22/2013 0846   K 3.8 07/06/2012 1122   K 3.8 06/01/2012 1008   CL 107 04/22/2013 0922   CL 102 07/06/2012 1122   CL 103 06/01/2012 1008   CO2 24 11/22/2013 0846   CO2 20 07/06/2012 1122   CO2 25 06/01/2012 1008   BUN 9.7 11/22/2013 0846   BUN 8 07/06/2012 1122   BUN 9 06/01/2012 1008   CREATININE 0.8 11/22/2013 0846   CREATININE 0.60 07/06/2012 1122   CREATININE 0.9 06/01/2012 1008      Component Value Date/Time   CALCIUM 10.2 11/22/2013 0846   CALCIUM 9.1 07/06/2012 1122   CALCIUM 9.8 06/01/2012 1008   ALKPHOS 63 11/22/2013 0846   ALKPHOS 69 02/10/2012 1035   ALKPHOS 78 08/14/2010 1251   AST 19 11/22/2013 0846   AST 20 02/10/2012 1035   AST 19 08/14/2010 1251   ALT 17 11/22/2013 0846   ALT 19 02/10/2012 1035   ALT 9* 08/14/2010 1251   BILITOT 0.30 11/22/2013 0846   BILITOT 0.4 02/10/2012 1035   BILITOT 0.40 08/14/2010 1251       RADIOGRAPHIC STUDIES: NUCLEAR MEDICINE PET CT SKULL BASE TO  THIGH  Technique: Technique: 18.8 mCi F-18 FDG was injected  intravenously. CT data was obtained and used for attenuation  correction and anatomic localization only. (This was not acquired  as a diagnostic CT examination.) Additional exam technical data  entered on technologist worksheet.  Comparison: PET of 02/07/2011. CTs of 06/01/2012.  Findings: Neck: Hypermetabolism within the left side of the  mandible, with suspicion of concurrent periapical focal lucency on  image 24.  Bilateral cervical hypermetabolic lymph nodes. Hypermetabolism  which is primarily felt to be nodal surrounds the right  jugular  vein and extends into the thoracic inlet. This measures a S.U.V.  max of 9.9 on image 57  Chest: Hypermetabolic right axillary node. This measures 1.7 cm  and a S.U.V. max of 6.1 on image 82. On the prior PET, this node  measured 1.7 cm and a S.U.V. max of 3.1.  Small prevascular nodes which are hypermetabolic, including on  image 72.  Abdomen/Pelvis: Right adrenal hypermetabolism which is without CT  correlate. Hypermetabolic left external iliac adenopathy. This  measures 1.3 cm and a S.U.V. max of 5.0 on image 181. On the prior  PET, this node measured 1.6 cm and on the order of a S.U.V. max of  2.6.  Skelton: Multifocal marrow hypermetabolism consistent with osseous  involvement. Index lesion in the right humeral head measures a  S.U.V. max of 14.4 on image 53 and is new.  A hypermetabolic lytic lesion within the left iliac wing is new or  progressive and measures a S.U.V. max of 8.1 on image 155.  CT images performed for attenuation correction demonstrate  increased number and size of lymph nodes within the neck. Index  right jugulodigastric node measures 9 mm short axis on image 34  versus 6 mm on the prior PET. Chest, abdomen, and pelvic findings  deferred to recent diagnostic CTs. Trace right-sided pleural fluid  is new. Left upper lobe radiation fibrosis. Punctate left renal   calculi. Cholelithiasis. Fibroid uterus. L4 osseous metastasis  has possible epidural component on image 144 transverse.  IMPRESSION:  1. Increasing hypermetabolism associated with adenopathy within  the neck, chest, and pelvis, as detailed above. Findings are most  likely indicative of progression of lymphoma.  2. Progressive osseous metastasis, as detailed above. An L4 lesion  has possible epidural component. Consider pre and post contrast  lumbar spine MRI.  3. Hypermetabolism at the right lower neck and thoracic inlet  surrounds the right internal jugular vein. This appears somewhat  hyperattenuating. Cannot exclude thrombus. Consider right upper  extremity venous ultrasound with attention to this area.  4. Likely dental inflammatory left mandibular hypermetabolism.  Consider physical exam correlation.   ASSESSMENT: 79 year old female with  #1Patient with history of low-grade non-Hodgkin lymphoma she initially received CVP Rituxan and then subsequently on maintenance Rituxan, now on R Bendamustine.  She had a PET/CT on 1/10 which demonstrated response to her therapy. She will receive a total of 6 cycles R Bendamustine and the began maintenance Rituxan on 3/28.  Last CT chest/abd/pelvis on 07/22/13 showed no recurrence of lymphoma.    #2 metastatic breast carcinoma originally diagnosed in 2009 originally on letrozole 2.5 mg daily, now on Arimidex.  #3 L4 bone metastasis which is painful. Patient is now status post palliative radiation therapy.  Also receiving Xgeva q28 days (currently on hold).    #4 Eczema--intermittent and responds to oral Prednisone tapers.     PLAN:  #1 patient will proceed with scheduled Rituxan today. Risks benefits and side effects of treatment are discussed with her again.  #2 metastatic breast cancer: She will continue Arimidex 1 mg daily.  #3 bone metastasis: Patient has been on xgeva in the past. However due to her dental work we are holding this for  now until we get her clearance from her oral surgeon. Dental extraction on 11/28/13. She still needs to have some more teeth extracted so we will continue to hold xgeva.  #4 patient will need to have restaging scans: will be scheduled in April 2015  #5 patient will  be seen back on  01/24/2014 for followup and next treatment  All questions were answered. The patient knows to call the clinic with any problems, questions or concerns. We can certainly see the patient much sooner if necessary.  I spent 25 minutes counseling the patient face to face. The total time spent in the appointment was 30 minutes.  Marcy Panning, MD Medical/Oncology [facility] [phone] (beeper) [phone] (Office)  12/13/2013, 9:03 AM

## 2013-12-13 NOTE — Patient Instructions (Addendum)
[facility] Discharge Instructions for Patients Receiving Chemotherapy  Today you received the following chemotherapy agents:  Rituxan  To help prevent nausea and vomiting after your treatment, we encourage you to take your nausea medication as ordered per MD.   If you develop nausea and vomiting that is not controlled by your nausea medication, call the clinic.   BELOW ARE SYMPTOMS THAT SHOULD BE REPORTED IMMEDIATELY:  *FEVER GREATER THAN 100.5 F  *CHILLS WITH OR WITHOUT FEVER  NAUSEA AND VOMITING THAT IS NOT CONTROLLED WITH YOUR NAUSEA MEDICATION  *UNUSUAL SHORTNESS OF BREATH  *UNUSUAL BRUISING OR BLEEDING  TENDERNESS IN MOUTH AND THROAT WITH OR WITHOUT PRESENCE OF ULCERS  *URINARY PROBLEMS  *BOWEL PROBLEMS  UNUSUAL RASH Items with * indicate a potential emergency and should be followed up as soon as possible.  Feel free to call the clinic should you have any questions or concerns. The clinic phone number is [phone].  It was my pleasure to take care of you today!  Leeanne Rio, RN

## 2013-12-28 ENCOUNTER — Telehealth: Payer: Self-pay | Admitting: Oncology

## 2013-12-28 NOTE — Telephone Encounter (Signed)
, °

## 2014-01-03 ENCOUNTER — Ambulatory Visit: Payer: Self-pay | Admitting: Oncology

## 2014-01-03 ENCOUNTER — Ambulatory Visit (HOSPITAL_BASED_OUTPATIENT_CLINIC_OR_DEPARTMENT_OTHER): Payer: Medicare Other

## 2014-01-03 ENCOUNTER — Other Ambulatory Visit: Payer: Self-pay | Admitting: Physician Assistant

## 2014-01-03 ENCOUNTER — Other Ambulatory Visit: Payer: Self-pay

## 2014-01-03 ENCOUNTER — Ambulatory Visit (HOSPITAL_BASED_OUTPATIENT_CLINIC_OR_DEPARTMENT_OTHER): Payer: Medicare Other | Admitting: Hematology and Oncology

## 2014-01-03 VITALS — BP 112/50 | HR 65 | Temp 98.1°F | Resp 16

## 2014-01-03 VITALS — BP 136/76 | HR 75 | Temp 97.6°F | Resp 18 | Ht 62.0 in | Wt 140.4 lb

## 2014-01-03 DIAGNOSIS — C8590 Non-Hodgkin lymphoma, unspecified, unspecified site: Secondary | ICD-10-CM

## 2014-01-03 DIAGNOSIS — E349 Endocrine disorder, unspecified: Secondary | ICD-10-CM

## 2014-01-03 DIAGNOSIS — C8589 Other specified types of non-Hodgkin lymphoma, extranodal and solid organ sites: Secondary | ICD-10-CM

## 2014-01-03 DIAGNOSIS — Z853 Personal history of malignant neoplasm of breast: Secondary | ICD-10-CM

## 2014-01-03 DIAGNOSIS — Z780 Asymptomatic menopausal state: Secondary | ICD-10-CM

## 2014-01-03 DIAGNOSIS — C7951 Secondary malignant neoplasm of bone: Secondary | ICD-10-CM

## 2014-01-03 DIAGNOSIS — M8580 Other specified disorders of bone density and structure, unspecified site: Secondary | ICD-10-CM

## 2014-01-03 DIAGNOSIS — Z5112 Encounter for antineoplastic immunotherapy: Secondary | ICD-10-CM

## 2014-01-03 DIAGNOSIS — D6489 Other specified anemias: Secondary | ICD-10-CM

## 2014-01-03 DIAGNOSIS — C50919 Malignant neoplasm of unspecified site of unspecified female breast: Secondary | ICD-10-CM

## 2014-01-03 DIAGNOSIS — M1710 Unilateral primary osteoarthritis, unspecified knee: Secondary | ICD-10-CM

## 2014-01-03 DIAGNOSIS — IMO0002 Reserved for concepts with insufficient information to code with codable children: Secondary | ICD-10-CM

## 2014-01-03 DIAGNOSIS — C7952 Secondary malignant neoplasm of bone marrow: Secondary | ICD-10-CM

## 2014-01-03 DIAGNOSIS — Z79890 Hormone replacement therapy (postmenopausal): Secondary | ICD-10-CM

## 2014-01-03 DIAGNOSIS — E890 Postprocedural hypothyroidism: Secondary | ICD-10-CM

## 2014-01-03 LAB — CBC WITH DIFFERENTIAL/PLATELET
BASO%: 0.5 % (ref 0.0–2.0)
Basophils Absolute: 0 10*3/uL (ref 0.0–0.1)
EOS ABS: 0.2 10*3/uL (ref 0.0–0.5)
EOS%: 4.2 % (ref 0.0–7.0)
HCT: 36.7 % (ref 34.8–46.6)
HGB: 11.9 g/dL (ref 11.6–15.9)
LYMPH%: 12.8 % — ABNORMAL LOW (ref 14.0–49.7)
MCH: 27.8 pg (ref 25.1–34.0)
MCHC: 32.4 g/dL (ref 31.5–36.0)
MCV: 85.7 fL (ref 79.5–101.0)
MONO#: 0.6 10*3/uL (ref 0.1–0.9)
MONO%: 14.7 % — ABNORMAL HIGH (ref 0.0–14.0)
NEUT#: 2.6 10*3/uL (ref 1.5–6.5)
NEUT%: 67.8 % (ref 38.4–76.8)
Platelets: 215 10*3/uL (ref 145–400)
RBC: 4.28 10*6/uL (ref 3.70–5.45)
RDW: 14.9 % — AB (ref 11.2–14.5)
WBC: 3.8 10*3/uL — AB (ref 3.9–10.3)
lymph#: 0.5 10*3/uL — ABNORMAL LOW (ref 0.9–3.3)

## 2014-01-03 LAB — COMPREHENSIVE METABOLIC PANEL (CC13)
ALT: 16 U/L (ref 0–55)
AST: 19 U/L (ref 5–34)
Albumin: 4 g/dL (ref 3.5–5.0)
Alkaline Phosphatase: 74 U/L (ref 40–150)
Anion Gap: 9 mEq/L (ref 3–11)
BUN: 11.4 mg/dL (ref 7.0–26.0)
CALCIUM: 10.4 mg/dL (ref 8.4–10.4)
CHLORIDE: 107 meq/L (ref 98–109)
CO2: 25 mEq/L (ref 22–29)
Creatinine: 0.8 mg/dL (ref 0.6–1.1)
GLUCOSE: 80 mg/dL (ref 70–140)
Potassium: 4 mEq/L (ref 3.5–5.1)
Sodium: 141 mEq/L (ref 136–145)
Total Bilirubin: 0.24 mg/dL (ref 0.20–1.20)
Total Protein: 7.2 g/dL (ref 6.4–8.3)

## 2014-01-03 LAB — URIC ACID (CC13): Uric Acid, Serum: 5.9 mg/dl (ref 2.6–7.4)

## 2014-01-03 LAB — TSH CHCC: TSH: 3.303 m[IU]/L (ref 0.308–3.960)

## 2014-01-03 LAB — LACTATE DEHYDROGENASE (CC13): LDH: 265 U/L — ABNORMAL HIGH (ref 125–245)

## 2014-01-03 LAB — MAGNESIUM (CC13): Magnesium: 2.1 mg/dl (ref 1.5–2.5)

## 2014-01-03 MED ORDER — SODIUM CHLORIDE 0.9 % IV SOLN
375.0000 mg/m2 | Freq: Once | INTRAVENOUS | Status: AC
  Administered 2014-01-03: 600 mg via INTRAVENOUS
  Filled 2014-01-03: qty 60

## 2014-01-03 MED ORDER — LIDOCAINE-PRILOCAINE 2.5-2.5 % EX CREA
TOPICAL_CREAM | CUTANEOUS | Status: AC
  Filled 2014-01-03: qty 5

## 2014-01-03 MED ORDER — DIPHENHYDRAMINE HCL 25 MG PO CAPS
ORAL_CAPSULE | ORAL | Status: AC
  Filled 2014-01-03: qty 2

## 2014-01-03 MED ORDER — ACETAMINOPHEN 325 MG PO TABS
650.0000 mg | ORAL_TABLET | Freq: Once | ORAL | Status: AC
  Administered 2014-01-03: 650 mg via ORAL

## 2014-01-03 MED ORDER — SODIUM CHLORIDE 0.9 % IV SOLN
Freq: Once | INTRAVENOUS | Status: AC
  Administered 2014-01-03: 11:00:00 via INTRAVENOUS

## 2014-01-03 MED ORDER — DIPHENHYDRAMINE HCL 25 MG PO CAPS
50.0000 mg | ORAL_CAPSULE | Freq: Once | ORAL | Status: AC
  Administered 2014-01-03: 50 mg via ORAL

## 2014-01-03 MED ORDER — ACETAMINOPHEN 325 MG PO TABS
ORAL_TABLET | ORAL | Status: AC
  Filled 2014-01-03: qty 2

## 2014-01-03 MED ORDER — HEPARIN SOD (PORK) LOCK FLUSH 100 UNIT/ML IV SOLN
500.0000 [IU] | Freq: Once | INTRAVENOUS | Status: AC | PRN
  Administered 2014-01-03: 500 [IU]
  Filled 2014-01-03: qty 5

## 2014-01-03 MED ORDER — SODIUM CHLORIDE 0.9 % IJ SOLN
10.0000 mL | INTRAMUSCULAR | Status: DC | PRN
  Administered 2014-01-03: 10 mL
  Filled 2014-01-03: qty 10

## 2014-01-03 NOTE — Patient Instructions (Signed)
[facility] Discharge Instructions for Patients Receiving Chemotherapy  Today you received the following chemotherapy agents Rituxan  To help prevent nausea and vomiting after your treatment, we encourage you to take your nausea medication as needed   If you develop nausea and vomiting that is not controlled by your nausea medication, call the clinic.   BELOW ARE SYMPTOMS THAT SHOULD BE REPORTED IMMEDIATELY:  *FEVER GREATER THAN 100.5 F  *CHILLS WITH OR WITHOUT FEVER  NAUSEA AND VOMITING THAT IS NOT CONTROLLED WITH YOUR NAUSEA MEDICATION  *UNUSUAL SHORTNESS OF BREATH  *UNUSUAL BRUISING OR BLEEDING  TENDERNESS IN MOUTH AND THROAT WITH OR WITHOUT PRESENCE OF ULCERS  *URINARY PROBLEMS  *BOWEL PROBLEMS  UNUSUAL RASH Items with * indicate a potential emergency and should be followed up as soon as possible.  Feel free to call the clinic you have any questions or concerns. The clinic phone number is [phone].

## 2014-01-03 NOTE — Patient Instructions (Signed)
[facility] Discharge Instructions for Patients Receiving Chemotherapy  Today you received the following chemotherapy agents: Rituxan   To help prevent nausea and vomiting after your treatment, we encourage you to take your nausea medication as prescribed by your physician.    If you develop nausea and vomiting that is not controlled by your nausea medication, call the clinic.   BELOW ARE SYMPTOMS THAT SHOULD BE REPORTED IMMEDIATELY:  *FEVER GREATER THAN 100.5 F  *CHILLS WITH OR WITHOUT FEVER  NAUSEA AND VOMITING THAT IS NOT CONTROLLED WITH YOUR NAUSEA MEDICATION  *UNUSUAL SHORTNESS OF BREATH  *UNUSUAL BRUISING OR BLEEDING  TENDERNESS IN MOUTH AND THROAT WITH OR WITHOUT PRESENCE OF ULCERS  *URINARY PROBLEMS  *BOWEL PROBLEMS  UNUSUAL RASH Items with * indicate a potential emergency and should be followed up as soon as possible.  Feel free to call the clinic you have any questions or concerns. The clinic phone number is [phone].    

## 2014-01-04 ENCOUNTER — Other Ambulatory Visit: Payer: Self-pay | Admitting: Hematology and Oncology

## 2014-01-04 ENCOUNTER — Other Ambulatory Visit: Payer: Self-pay

## 2014-01-04 ENCOUNTER — Encounter: Payer: Self-pay | Admitting: Hematology and Oncology

## 2014-01-04 DIAGNOSIS — C7951 Secondary malignant neoplasm of bone: Secondary | ICD-10-CM

## 2014-01-04 DIAGNOSIS — C8590 Non-Hodgkin lymphoma, unspecified, unspecified site: Secondary | ICD-10-CM

## 2014-01-04 DIAGNOSIS — C50919 Malignant neoplasm of unspecified site of unspecified female breast: Secondary | ICD-10-CM

## 2014-01-04 LAB — VITAMIN D 25 HYDROXY (VIT D DEFICIENCY, FRACTURES): Vit D, 25-Hydroxy: 46 ng/mL (ref 30–89)

## 2014-01-04 NOTE — Progress Notes (Signed)
[name]  OFFICE PROGRESS NOTE  CC  Helen Calico, MD [address] [facility] 1st [address] [place] [address]  DIAGNOSIS: 79 year old female with:  #1 left breast carcinoma originally presenting as a fungating mass. Patient underwent neoadjuvant chemotherapy followed by mastectomy. She is currently on Arimidex 1mg  daily.   #2 low-grade non-Hodgkin lymphoma status post CVP and Rituxan  received R-Bendamustine, currently maintenance Rituxan.   PRIOR THERAPY:  #1 the patient was diagnosed with metastatic breast carcinoma beginning October 2009. She received chemotherapy followed by mastectomy radiation. She was on letrozole 2.5 mg daily, but has changed to Arimidex 1mg  daily in November 2014.  Also receives Xgeva every 28 days.    #2 Was diagnosed with low-grade non-Hodgkin lymphoma   She received 6 cycles of CVP and Rituxan, followed by maintenance Rituxan, who recurred and  received  R-Bendamustine .    #3 Pt underwent 4 cycles of R-Bendamustin PET/CT on 1/10 showed CR to therapy completed 2 additional cycles of treatment .  #4 Maintenance Rituximab every 3 weeks started on 01/28/13.  Last CT scans on 07/22/13 of chest/abdomen/pelvis show no recurrence of lymphoma and stable bone disease.    CURRENT THERAPY:  Arimidex daily, Xgeva (currently on hold), and Maintenance Rituximab  INTERVAL HISTORY: Helen [name] 79 y.o. female returns for evaluation today prior to her maintenance rituximab.  She is feeling fairly well .    She is taking Arimidex daily.She appears confused about her medicines since she just noticed "her breast cancer pill is 1 mg and used to be 2.5 mg" She takes 1 tabl Calcium daily and no Vit D. Patient  had 3 dental extractions and needs 2 more, planned in the next few weeks.  Patient's knee pain is still  present. She  states she can hardly walk due to pain to her low back area and that has been going on for a while.  She denies any trauma, its just been steadily increasing over the  past month. She denies bowel or bladder incontinence. She denies fever,cough or abdominal pain.   Otherwise, she is doing well and a 10 point ROS is neg.   MEDICAL HISTORY: Past Medical History  Diagnosis Date  . Arthritis   . Blood transfusion 2009  . Breast CA 08/2008    (LT) breast ca dx 10/09/Chemo  . Lymphoma 09/09/2011    NHL  . Leukemia, acute, in remission 2012    Pt. not sure of type  . Hypertension   . Seizures 1980's    from medication reaction/Pt.  [name] Hx of radiation therapy 09/11/08 -10/31/08    left breast  . Metastasis to bone 07/03/2012    MRI L spine  . History of radiation therapy eot 07/30/12    lumbar spine L4 /l shoulder    ALLERGIES:  is allergic to codeine; penicillins; sulfonamide derivatives; tape; and tramadol hcl.  MEDICATIONS:  Current Outpatient Prescriptions  Medication Sig Dispense Refill  . calcium-vitamin D (OSCAL WITH D) 500-200 MG-UNIT per tablet Take 1 tablet by mouth daily.       [name] gatifloxacin (ZYMAXID) 0.5 % SOLN Place 1 drop into both eyes 4 (four) times daily.      . prednisoLONE acetate (PRED FORTE) 1 % ophthalmic suspension Place 1 drop into both eyes 4 (four) times daily.      [name] acetaminophen (TYLENOL) 500 MG tablet Take 500 mg by mouth every 6 (six) hours as needed for mild pain.      [name] anastrozole (  ARIMIDEX) 1 MG tablet Take 1 tablet (1 mg total) by mouth daily.  30 tablet  5  . oxyCODONE-acetaminophen (PERCOCET/ROXICET) 5-325 MG per tablet Take 2 tablets by mouth every 4 (four) hours as needed for severe pain.  30 tablet  0   No current facility-administered medications for this visit.    SURGICAL HISTORY:  Past Surgical History  Procedure Laterality Date  . Mastectomy    . Thyroidectomy  1965  . Appendectomy    . Tonsillectomy      REVIEW OF SYSTEMS:  A 10 point review of systems was conducted and is otherwise negative except for what is noted above.    PHYSICAL EXAMINATION:  BP 136/76  Pulse 75  Temp(Src) 97.6 F  (36.4 C) (Oral)  Resp 18  Ht 5\' 2"  (1.575 m)  Wt 140 lb 6.4 oz (63.685 kg)  BMI 25.67 kg/m2 GENERAL: Patient is a well appearing female in no acute distress Face is slightly swollen. HEENT:  Sclerae anicteric.  Oropharynx clear and moist. No ulcerations or evidence of oropharyngeal candidiasis. Neck is supple.  NODES:  No cervical, supraclavicular, or axillary lymphadenopathy .  BREAST EXAM:  Deferred. LUNGS:  Clear to auscultation bilaterally.  No wheezes or rhonchi. HEART:  Regular rate and rhythm. No murmurs ABDOMEN:  Soft, nontender.  Positive, normoactive bowel sounds. No organomegaly . MSK:   focal spinal tenderness  on percussion lower lumbar spine. Full range of motion bilaterally in the upper extremities. EXTREMITIES:  No peripheral edema.   SKIN:  Clear with no obvious rashes or skin changes. No nail dyscrasia. NEURO:  Nonfocal. Well oriented.  Appropriate affect. ECOG PERFORMANCE STATUS: 1 - Symptomatic but completely ambulatory  LABORATORY DATA: Lab Results  Component Value Date   WBC 3.8* 01/03/2014   HGB 11.9 01/03/2014   HCT 36.7 01/03/2014   MCV 85.7 01/03/2014   PLT 215 01/03/2014      Chemistry      Component Value Date/Time   NA 141 01/03/2014 0942   NA 136 07/06/2012 1122   NA 142 06/01/2012 1008   K 4.0 01/03/2014 0942   K 3.8 07/06/2012 1122   K 3.8 06/01/2012 1008   CL 107 04/22/2013 0922   CL 102 07/06/2012 1122   CL 103 06/01/2012 1008   CO2 25 01/03/2014 0942   CO2 20 07/06/2012 1122   CO2 25 06/01/2012 1008   BUN 11.4 01/03/2014 0942   BUN 8 07/06/2012 1122   BUN 9 06/01/2012 1008   CREATININE 0.8 01/03/2014 0942   CREATININE 0.60 07/06/2012 1122   CREATININE 0.9 06/01/2012 1008      Component Value Date/Time   CALCIUM 10.4 01/03/2014 0942   CALCIUM 9.1 07/06/2012 1122   CALCIUM 9.8 06/01/2012 1008   ALKPHOS 74 01/03/2014 0942   ALKPHOS 69 02/10/2012 1035   ALKPHOS 78 08/14/2010 1251   AST 19 01/03/2014 0942   AST 20 02/10/2012 1035   AST 19 08/14/2010 1251   ALT 16 01/03/2014 0942    ALT 19 02/10/2012 1035   ALT 9* 08/14/2010 1251   BILITOT 0.24 01/03/2014 0942   BILITOT 0.4 02/10/2012 1035   BILITOT 0.40 08/14/2010 1251       RADIOGRAPHIC STUDIES: NUCLEAR MEDICINE PET CT SKULL BASE TO THIGH  Technique: Technique: 18.8 mCi F-18 FDG was injected  intravenously. CT data was obtained and used for attenuation  correction and anatomic localization only. (This was not acquired  as a diagnostic CT examination.) Additional exam technical data  entered on technologist worksheet.  Comparison: PET of 02/07/2011. CTs of 06/01/2012.  Findings: Neck: Hypermetabolism within the left side of the  mandible, with suspicion of concurrent periapical focal lucency on  image 24.  Bilateral cervical hypermetabolic lymph nodes. Hypermetabolism  which is primarily felt to be nodal surrounds the right jugular  vein and extends into the thoracic inlet. This measures a S.U.V.  max of 9.9 on image 57  Chest: Hypermetabolic right axillary node. This measures 1.7 cm  and a S.U.V. max of 6.1 on image 82. On the prior PET, this node  measured 1.7 cm and a S.U.V. max of 3.1.  Small prevascular nodes which are hypermetabolic, including on  image 72.  Abdomen/Pelvis: Right adrenal hypermetabolism which is without CT  correlate. Hypermetabolic left external iliac adenopathy. This  measures 1.3 cm and a S.U.V. max of 5.0 on image 181. On the prior  PET, this node measured 1.6 cm and on the order of a S.U.V. max of  2.6.  [place]: Multifocal marrow hypermetabolism consistent with osseous  involvement. Index lesion in the right humeral head measures a  S.U.V. max of 14.4 on image 53 and is new.  A hypermetabolic lytic lesion within the left iliac wing is new or  progressive and measures a S.U.V. max of 8.1 on image 155.  CT images performed for attenuation correction demonstrate  increased number and size of lymph nodes within the neck. Index  right jugulodigastric node measures 9 mm short axis on  image 34  versus 6 mm on the prior PET. Chest, abdomen, and pelvic findings  deferred to recent diagnostic CTs. Trace right-sided pleural fluid  is new. Left upper lobe radiation fibrosis. Punctate left renal  calculi. Cholelithiasis. Fibroid uterus. L4 osseous metastasis  has possible epidural component on image 144 transverse.  IMPRESSION:  1. Increasing hypermetabolism associated with adenopathy within  the neck, chest, and pelvis, as detailed above. Findings are most  likely indicative of progression of lymphoma.  2. Progressive osseous metastasis, as detailed above. An L4 lesion  has possible epidural component. Consider pre and post contrast  lumbar spine MRI.  3. Hypermetabolism at the right lower neck and thoracic inlet  surrounds the right internal jugular vein. This appears somewhat  hyperattenuating. Cannot exclude thrombus. Consider right upper  extremity venous ultrasound with attention to this area.  4. Likely dental inflammatory left mandibular hypermetabolism.  Consider physical exam correlation.   ASSESSMENT: 79 year old female with  #1Patient with history of low-grade non-Hodgkin lymphoma she initially received CVP Rituxan and then subsequently on maintenance Rituxan, then R Bendamustine.  She had a PET/CT on 1/10 which demonstrated response to her therapy. She received  total of 6 cycles R Bendamustine and then began maintenance Rituxan on 01/28/13.  Last CT chest/abd/pelvis on 07/22/13 showed no recurrence of lymphoma.    #2 metastatic breast carcinoma originally diagnosed in 2009 originally on letrozole 2.5 mg daily, now on Arimidex 1 mg daily since 09/2013.  #3 L4 bone metastasis which is painful. Patient is now status post palliative radiation therapy.  Also receiving Xgeva q28 days (currently on hold).    #4 Eczema--intermittent and responds to oral Prednisone tapers currenly not active.    PLAN:  #1 patient will proceed with scheduled Rituxan today. Risks  benefits and side effects of treatment are discussed with her again.  #2 metastatic breast cancer: She will continue Arimidex 1 mg daily.I explaind to the patient that she is on  correct medication for her breast  cancer (anastrozole) that comes in different mg than the previous (letrozole)she was on and that her medication was switched on 09/2013.  #3 bone metastasis: Patient has been on xgeva in the past. However due to her dental work we are holding this for now until we get her clearance from her oral surgeon. Dental extraction on 11/28/13. She still needs to have some more teeth extracted so we will continue to hold Xgeva.2 more extractions planned according to the patient  #4 patient will need to have restaging scans: will be scheduled in April 2015  #5 he has persistent progressive pain of her low back with difficulty ambulating,prior known L4 lesion with epidural component S/P Radiation.Will order MRI Lumbar spine   $6 will check uric acid,Mg,vit D level today.! Advised patient to take 2 tabl of Calcium 600 mg and vit D3 2000 IU one tabl daily  #7 S/P surgical hypothyroidism has not been evaluated recently as per patient not on replacement will check TSH.  #5 patient will be seen back on  01/24/2014 for followup and next treatment  All questions were answered. The patient knows to call the clinic with any problems, questions or concerns. We can certainly see the patient much sooner if necessary.  I spent 25 minutes counseling the patient face to face. The total time spent in the appointment was 30 minutes . Amada Kingfisher, M.D. Oncology/Hematology Page [phone] (Office)    01/04/2014, 9:38 AM

## 2014-01-18 ENCOUNTER — Ambulatory Visit (HOSPITAL_COMMUNITY)
Admission: RE | Admit: 2014-01-18 | Discharge: 2014-01-18 | Disposition: A | Discharge status: Home / Self Care | Payer: Medicare Other | Source: Ambulatory Visit | Attending: Hematology and Oncology | Admitting: Hematology and Oncology

## 2014-01-18 ENCOUNTER — Other Ambulatory Visit: Payer: Self-pay | Admitting: Hematology and Oncology

## 2014-01-18 DIAGNOSIS — Z87898 Personal history of other specified conditions: Secondary | ICD-10-CM | POA: Insufficient documentation

## 2014-01-18 DIAGNOSIS — C7951 Secondary malignant neoplasm of bone: Secondary | ICD-10-CM

## 2014-01-18 DIAGNOSIS — M545 Low back pain, unspecified: Secondary | ICD-10-CM | POA: Insufficient documentation

## 2014-01-18 DIAGNOSIS — C8590 Non-Hodgkin lymphoma, unspecified, unspecified site: Secondary | ICD-10-CM

## 2014-01-18 DIAGNOSIS — M48061 Spinal stenosis, lumbar region without neurogenic claudication: Secondary | ICD-10-CM | POA: Insufficient documentation

## 2014-01-18 DIAGNOSIS — C50919 Malignant neoplasm of unspecified site of unspecified female breast: Secondary | ICD-10-CM | POA: Insufficient documentation

## 2014-01-18 DIAGNOSIS — M4310 Spondylolisthesis, site unspecified: Secondary | ICD-10-CM | POA: Insufficient documentation

## 2014-01-18 MED ORDER — GADOBENATE DIMEGLUMINE 529 MG/ML IV SOLN
13.0000 mL | Freq: Once | INTRAVENOUS | Status: AC | PRN
  Administered 2014-01-18: 13 mL via INTRAVENOUS

## 2014-01-19 ENCOUNTER — Other Ambulatory Visit: Payer: Self-pay | Admitting: Hematology and Oncology

## 2014-01-24 ENCOUNTER — Other Ambulatory Visit (HOSPITAL_BASED_OUTPATIENT_CLINIC_OR_DEPARTMENT_OTHER): Payer: Medicare Other

## 2014-01-24 ENCOUNTER — Ambulatory Visit (HOSPITAL_BASED_OUTPATIENT_CLINIC_OR_DEPARTMENT_OTHER): Payer: Medicare Other | Admitting: Oncology

## 2014-01-24 ENCOUNTER — Ambulatory Visit (HOSPITAL_BASED_OUTPATIENT_CLINIC_OR_DEPARTMENT_OTHER): Payer: Medicare Other

## 2014-01-24 ENCOUNTER — Other Ambulatory Visit: Payer: Self-pay | Admitting: Hematology and Oncology

## 2014-01-24 ENCOUNTER — Encounter: Payer: Self-pay | Admitting: Oncology

## 2014-01-24 VITALS — BP 143/79 | HR 78 | Temp 97.7°F | Resp 18 | Ht 62.0 in | Wt 140.7 lb

## 2014-01-24 VITALS — BP 141/65 | HR 70 | Temp 97.0°F | Resp 18

## 2014-01-24 DIAGNOSIS — Z5181 Encounter for therapeutic drug level monitoring: Secondary | ICD-10-CM

## 2014-01-24 DIAGNOSIS — Z923 Personal history of irradiation: Secondary | ICD-10-CM

## 2014-01-24 DIAGNOSIS — C7951 Secondary malignant neoplasm of bone: Secondary | ICD-10-CM

## 2014-01-24 DIAGNOSIS — C7952 Secondary malignant neoplasm of bone marrow: Secondary | ICD-10-CM

## 2014-01-24 DIAGNOSIS — C8590 Non-Hodgkin lymphoma, unspecified, unspecified site: Secondary | ICD-10-CM

## 2014-01-24 DIAGNOSIS — Z79620 Long term (current) use of immunosuppressive biologic: Secondary | ICD-10-CM

## 2014-01-24 DIAGNOSIS — Z79899 Other long term (current) drug therapy: Secondary | ICD-10-CM

## 2014-01-24 DIAGNOSIS — C8589 Other specified types of non-Hodgkin lymphoma, extranodal and solid organ sites: Secondary | ICD-10-CM

## 2014-01-24 DIAGNOSIS — C50919 Malignant neoplasm of unspecified site of unspecified female breast: Secondary | ICD-10-CM

## 2014-01-24 DIAGNOSIS — Z5112 Encounter for antineoplastic immunotherapy: Secondary | ICD-10-CM

## 2014-01-24 DIAGNOSIS — M8580 Other specified disorders of bone density and structure, unspecified site: Secondary | ICD-10-CM

## 2014-01-24 DIAGNOSIS — Z853 Personal history of malignant neoplasm of breast: Secondary | ICD-10-CM

## 2014-01-24 LAB — COMPREHENSIVE METABOLIC PANEL (CC13)
ALT: 20 U/L (ref 0–55)
ANION GAP: 10 meq/L (ref 3–11)
AST: 22 U/L (ref 5–34)
Albumin: 4.1 g/dL (ref 3.5–5.0)
Alkaline Phosphatase: 83 U/L (ref 40–150)
BILIRUBIN TOTAL: 0.23 mg/dL (ref 0.20–1.20)
BUN: 14.1 mg/dL (ref 7.0–26.0)
CO2: 25 mEq/L (ref 22–29)
CREATININE: 0.8 mg/dL (ref 0.6–1.1)
Calcium: 10.4 mg/dL (ref 8.4–10.4)
Chloride: 107 mEq/L (ref 98–109)
GLUCOSE: 86 mg/dL (ref 70–140)
Potassium: 4.1 mEq/L (ref 3.5–5.1)
Sodium: 141 mEq/L (ref 136–145)
Total Protein: 7.2 g/dL (ref 6.4–8.3)

## 2014-01-24 LAB — CBC WITH DIFFERENTIAL/PLATELET
BASO%: 0.7 % (ref 0.0–2.0)
Basophils Absolute: 0 10*3/uL (ref 0.0–0.1)
EOS%: 6 % (ref 0.0–7.0)
Eosinophils Absolute: 0.3 10*3/uL (ref 0.0–0.5)
HEMATOCRIT: 38.7 % (ref 34.8–46.6)
HGB: 12.5 g/dL (ref 11.6–15.9)
LYMPH%: 10.5 % — ABNORMAL LOW (ref 14.0–49.7)
MCH: 28 pg (ref 25.1–34.0)
MCHC: 32.3 g/dL (ref 31.5–36.0)
MCV: 86.8 fL (ref 79.5–101.0)
MONO#: 0.7 10*3/uL (ref 0.1–0.9)
MONO%: 15.1 % — ABNORMAL HIGH (ref 0.0–14.0)
NEUT#: 2.9 10*3/uL (ref 1.5–6.5)
NEUT%: 67.7 % (ref 38.4–76.8)
PLATELETS: 206 10*3/uL (ref 145–400)
RBC: 4.46 10*6/uL (ref 3.70–5.45)
RDW: 14.6 % — ABNORMAL HIGH (ref 11.2–14.5)
WBC: 4.3 10*3/uL (ref 3.9–10.3)
lymph#: 0.5 10*3/uL — ABNORMAL LOW (ref 0.9–3.3)
nRBC: 0 % (ref 0–0)

## 2014-01-24 LAB — LACTATE DEHYDROGENASE (CC13): LDH: 284 U/L — ABNORMAL HIGH (ref 125–245)

## 2014-01-24 MED ORDER — DIPHENHYDRAMINE HCL 25 MG PO CAPS
50.0000 mg | ORAL_CAPSULE | Freq: Once | ORAL | Status: AC
  Administered 2014-01-24: 50 mg via ORAL

## 2014-01-24 MED ORDER — SODIUM CHLORIDE 0.9 % IV SOLN
375.0000 mg/m2 | Freq: Once | INTRAVENOUS | Status: AC
  Administered 2014-01-24: 600 mg via INTRAVENOUS
  Filled 2014-01-24: qty 60

## 2014-01-24 MED ORDER — DIPHENHYDRAMINE HCL 25 MG PO CAPS
ORAL_CAPSULE | ORAL | Status: AC
  Filled 2014-01-24: qty 2

## 2014-01-24 MED ORDER — HEPARIN SOD (PORK) LOCK FLUSH 100 UNIT/ML IV SOLN
500.0000 [IU] | Freq: Once | INTRAVENOUS | Status: AC | PRN
  Administered 2014-01-24: 500 [IU]
  Filled 2014-01-24: qty 5

## 2014-01-24 MED ORDER — ACETAMINOPHEN 325 MG PO TABS
ORAL_TABLET | ORAL | Status: AC
  Filled 2014-01-24: qty 2

## 2014-01-24 MED ORDER — SODIUM CHLORIDE 0.9 % IV SOLN
Freq: Once | INTRAVENOUS | Status: AC
  Administered 2014-01-24: 10:00:00 via INTRAVENOUS

## 2014-01-24 MED ORDER — ACETAMINOPHEN 325 MG PO TABS
650.0000 mg | ORAL_TABLET | Freq: Once | ORAL | Status: AC
  Administered 2014-01-24: 650 mg via ORAL

## 2014-01-24 MED ORDER — SODIUM CHLORIDE 0.9 % IJ SOLN
10.0000 mL | INTRAMUSCULAR | Status: DC | PRN
  Administered 2014-01-24: 10 mL
  Filled 2014-01-24: qty 10

## 2014-01-24 NOTE — Progress Notes (Signed)
Helen [name] OFFICE PROGRESS NOTE  Patient Care Team: No Pcp Per Patient as PCP - General (General Practice) Amada Kingfisher, MD as Consulting Physician (Hematology and Oncology)  DIAGNOSIS: 79 year old female with:   #1 left breast carcinoma originally presenting as a fungating mass. Patient underwent neoadjuvant chemotherapy followed by mastectomy. She is currently on Arimidex 1mg  daily.  #2 low-grade non-Hodgkin lymphoma status post CVP and Rituxan now receiving R-Bendamustine, followed by maintenance Rituxan.    SUMMARY OF ONCOLOGIC HISTORY: #1 the patient was diagnosed with metastatic breast carcinoma beginning October 2009. She returned her 1 chemotherapy followed by mastectomy radiation. She was on letrozole 2.5 mg daily, but has changed to Arimidex 1mg  daily. Also receives Xgeva every 28 days.  #2 Was diagnosed with low-grade non-Hodgkin lymphoma 12. She received 6 cycles of CVP and Rituxan, followed by maintenance Rituxan, who recurred and is now on R-Bendamustine.  #3 Pt underwent 4 cycles of R-Bendamustin PET/CT on 1/10 showed CR to therapy, will complete 2 cycles of treatment and the proceed to maintenance Rituxan.  #4 Maintenance Rituximab every 3 weeks started on 01/28/13. Last CT scans on 07/22/13 of chest/abdomen/pelvis show no recurrence of lymphoma and stable bone disease.   CURRENT THERAPY: Arimidex daily, Xgeva (currently on hold), and Maintenance Rituximab cycle # 14   INTERVAL HISTORY: Helen [name] 79 y.o. female returns for followup visit prior to maintenance Rituxan. Overall she seems to be tolerating it very well. She is denying any complaints other than her knee problems and some weakness. She is also taking her Arimidex on a daily basis. Patient still has not completed her dental work and therefore we will continue to hold xgeva. She understands that this is not a good thing to be holding this medication since she does have osteopenia and she has metastatic  bone disease secondary to her breast cancer. She's demonstrated understanding.  I have reviewed the past medical history, past surgical history, social history and family history with the patient and they are unchanged from previous note.  ALLERGIES:  is allergic to codeine; penicillins; sulfonamide derivatives; tape; and tramadol hcl.  MEDICATIONS:  Current Outpatient Prescriptions  Medication Sig Dispense Refill  . acetaminophen (TYLENOL) 500 MG tablet Take 500 mg by mouth every 6 (six) hours as needed for mild pain.      [name] anastrozole (ARIMIDEX) 1 MG tablet Take 1 tablet (1 mg total) by mouth daily.  30 tablet  5  . calcium-vitamin D (OSCAL WITH D) 500-200 MG-UNIT per tablet Take 1 tablet by mouth daily.       [name] gatifloxacin (ZYMAXID) 0.5 % SOLN Place 1 drop into both eyes 4 (four) times daily.      [name] oxyCODONE-acetaminophen (PERCOCET/ROXICET) 5-325 MG per tablet Take 2 tablets by mouth every 4 (four) hours as needed for severe pain.  30 tablet  0  . prednisoLONE acetate (PRED FORTE) 1 % ophthalmic suspension Place 1 drop into both eyes 4 (four) times daily.       No current facility-administered medications for this visit.    REVIEW OF SYSTEMS:   Constitutional: Denies fevers, chills or abnormal weight loss Eyes: Denies blurriness of vision Ears, nose, mouth, throat, and face: Denies mucositis or sore throat Respiratory: Denies cough, dyspnea or wheezes Cardiovascular: Denies palpitation, chest discomfort or lower extremity swelling Gastrointestinal:  Denies nausea, heartburn or change in bowel habits Skin: Denies abnormal skin rashes Lymphatics: Denies new lymphadenopathy or easy bruising Neurological:Denies numbness, tingling or new weaknesses Behavioral/Psych:  Mood is stable, no new changes  All other systems were reviewed with the patient and are negative.  PHYSICAL EXAMINATION: ECOG PERFORMANCE STATUS: 1 - Symptomatic but completely ambulatory  Filed Vitals:   01/24/14  0837  BP: 143/79  Pulse: 78  Temp: 97.7 F (36.5 C)  Resp: 18   Filed Weights   01/24/14 0837  Weight: 140 lb 11.2 oz (63.821 kg)    GENERAL:alert, no distress and comfortable SKIN: skin color, texture, turgor are normal, no rashes or significant lesions EYES: normal, Conjunctiva are pink and non-injected, sclera clear OROPHARYNX:no exudate, no erythema and lips, buccal mucosa, and tongue normal  NECK: supple, thyroid normal size, non-tender, without nodularity LYMPH:  no palpable lymphadenopathy in the cervical, axillary or inguinal LUNGS: clear to auscultation and percussion with normal breathing effort HEART: regular rate & rhythm and no murmurs and no lower extremity edema ABDOMEN:abdomen soft, non-tender and normal bowel sounds Musculoskeletal:no cyanosis of digits and no clubbing  Right mastectomy site looks well-healed no evidence of local recurrence Port-A-Cath: No evidence of infection NEURO: alert & oriented x 3 with fluent speech, no focal motor/sensory deficits  LABORATORY DATA:  I have reviewed the data as listed    Component Value Date/Time   NA 141 01/03/2014 0942   NA 136 07/06/2012 1122   NA 142 06/01/2012 1008   K 4.0 01/03/2014 0942   K 3.8 07/06/2012 1122   K 3.8 06/01/2012 1008   CL 107 04/22/2013 0922   CL 102 07/06/2012 1122   CL 103 06/01/2012 1008   CO2 25 01/03/2014 0942   CO2 20 07/06/2012 1122   CO2 25 06/01/2012 1008   GLUCOSE 80 01/03/2014 0942   GLUCOSE 89 04/22/2013 0922   GLUCOSE 182* 07/06/2012 1122   GLUCOSE 108 06/01/2012 1008   BUN 11.4 01/03/2014 0942   BUN 8 07/06/2012 1122   BUN 9 06/01/2012 1008   CREATININE 0.8 01/03/2014 0942   CREATININE 0.60 07/06/2012 1122   CREATININE 0.9 06/01/2012 1008   CALCIUM 10.4 01/03/2014 0942   CALCIUM 9.1 07/06/2012 1122   CALCIUM 9.8 06/01/2012 1008   PROT 7.2 01/03/2014 0942   PROT 7.7 02/10/2012 1035   PROT 8.1 08/14/2010 1251   ALBUMIN 4.0 01/03/2014 0942   ALBUMIN 4.3 02/10/2012 1035   AST 19 01/03/2014 0942   AST 20 02/10/2012  1035   AST 19 08/14/2010 1251   ALT 16 01/03/2014 0942   ALT 19 02/10/2012 1035   ALT 9* 08/14/2010 1251   ALKPHOS 74 01/03/2014 0942   ALKPHOS 69 02/10/2012 1035   ALKPHOS 78 08/14/2010 1251   BILITOT 0.24 01/03/2014 0942   BILITOT 0.4 02/10/2012 1035   BILITOT 0.40 08/14/2010 1251   GFRNONAA 87* 07/06/2012 1122   GFRAA >90 07/06/2012 1122    No results found for this basename: SPEP, UPEP,  kappa and lambda light chains    Lab Results  Component Value Date   WBC 4.3 01/24/2014   NEUTROABS 2.9 01/24/2014   HGB 12.5 01/24/2014   HCT 38.7 01/24/2014   MCV 86.8 01/24/2014   PLT 206 01/24/2014      Chemistry      Component Value Date/Time   NA 141 01/03/2014 0942   NA 136 07/06/2012 1122   NA 142 06/01/2012 1008   K 4.0 01/03/2014 0942   K 3.8 07/06/2012 1122   K 3.8 06/01/2012 1008   CL 107 04/22/2013 0922   CL 102 07/06/2012 1122   CL 103 06/01/2012  1008   CO2 25 01/03/2014 0942   CO2 20 07/06/2012 1122   CO2 25 06/01/2012 1008   BUN 11.4 01/03/2014 0942   BUN 8 07/06/2012 1122   BUN 9 06/01/2012 1008   CREATININE 0.8 01/03/2014 0942   CREATININE 0.60 07/06/2012 1122   CREATININE 0.9 06/01/2012 1008      Component Value Date/Time   CALCIUM 10.4 01/03/2014 0942   CALCIUM 9.1 07/06/2012 1122   CALCIUM 9.8 06/01/2012 1008   ALKPHOS 74 01/03/2014 0942   ALKPHOS 69 02/10/2012 1035   ALKPHOS 78 08/14/2010 1251   AST 19 01/03/2014 0942   AST 20 02/10/2012 1035   AST 19 08/14/2010 1251   ALT 16 01/03/2014 0942   ALT 19 02/10/2012 1035   ALT 9* 08/14/2010 1251   BILITOT 0.24 01/03/2014 0942   BILITOT 0.4 02/10/2012 1035   BILITOT 0.40 08/14/2010 1251       RADIOGRAPHIC STUDIES: I have personally reviewed the radiological images as listed and agreed with the findings in the report. No results found.    ASSESSMENT & PLAN:  79 year old female with  #1 history of low-grade non-Hodgkin lymphoma originally treated with CVP Rituxan and then maintenance Rituxan. She subsequently had relapse and she got Rituxan bendamustine. She  had restaging scans performed that demonstrated significant response to therapy to Rituxan and bendamustine. We therefore completed a total of 6 cycles of this. She then was begun on maintenance Rituxan. She is now here for cycle 14. Restaging scans performed this month revealed no evidence of progressive disease. She has had a good partial to almost complete response. She will proceed with cycle 14 today of Rituxan. I will also plan on getting hepatitis panel to make sure the patient has not developed reactivation of hep B.  #2 metastatic breast carcinoma: Patient was originally diagnosed with stage IV she was treated with chemotherapy followed by radiation therapy please see previously notes for this. She has been on letrozole 2.5 mg daily in the past. Most recently we switched her to Arimidex 1 mg daily tolerating it well. This was done after she had some progression.  #3 bone metastasis: Patient has received palliative radiation therapy to the L4 bone metastases. Patient is also encouraged to begin Delton See however unfortunately she does need significant amount of dental work and therefore we are holding this. I have encouraged her to go see her dentist and to have lost of her teeth extracted. Rationale for holding this therapy was explained to the patient again.  #4 followup: Patient will be seen back in 3 weeks' time for cycle 15 of Rituxan.  Orders Placed This Encounter  Procedures  . Hepatitis B surface antigen    Standing Status: Future     Number of Occurrences:      Standing Expiration Date: 05/26/2014  . Hepatitis B surface antibody    Standing Status: Future     Number of Occurrences:      Standing Expiration Date: 05/26/2014  . Hepatitis B core antibody, total    Standing Status: Future     Number of Occurrences:      Standing Expiration Date: 05/26/2014  . Hepatitis B core antibody, IgM    Standing Status: Future     Number of Occurrences:      Standing Expiration Date: 05/26/2014    All questions were answered. The patient knows to call the clinic with any problems, questions or concerns. No barriers to learning was detected. I spent 20 minutes counseling  the patient face to face. The total time spent in the appointment was 30 minutes and more than 50% was on counseling and review of test results and coordination of care     Marcy Panning, MD 01/24/2014 9:47 AM

## 2014-01-24 NOTE — Patient Instructions (Signed)
Proceed with rituxan today  Please have your teeth taken care of so we can give you Delton See

## 2014-01-24 NOTE — Patient Instructions (Signed)
[facility] Discharge Instructions for Patients Receiving Chemotherapy  Today you received the following chemotherapy agents Rituxan.  To help prevent nausea and vomiting after your treatment, we encourage you to take your nausea medication as prescribed.   If you develop nausea and vomiting that is not controlled by your nausea medication, call the clinic.   BELOW ARE SYMPTOMS THAT SHOULD BE REPORTED IMMEDIATELY:  *FEVER GREATER THAN 100.5 F  *CHILLS WITH OR WITHOUT FEVER  NAUSEA AND VOMITING THAT IS NOT CONTROLLED WITH YOUR NAUSEA MEDICATION  *UNUSUAL SHORTNESS OF BREATH  *UNUSUAL BRUISING OR BLEEDING  TENDERNESS IN MOUTH AND THROAT WITH OR WITHOUT PRESENCE OF ULCERS  *URINARY PROBLEMS  *BOWEL PROBLEMS  UNUSUAL RASH Items with * indicate a potential emergency and should be followed up as soon as possible.  Feel free to call the clinic you have any questions or concerns. The clinic phone number is [phone].    

## 2014-02-01 ENCOUNTER — Telehealth: Payer: Self-pay | Admitting: *Deleted

## 2014-02-01 NOTE — Progress Notes (Signed)
Triage request from dentist - per KK tooth extraction no problem, antibiotics not needed if WBC within normal.  Last WBC 4.3.  Triage notified and will call back dentist.

## 2014-02-01 NOTE — Telephone Encounter (Signed)
Received call from [facility] @ Dr. Tamela Oddi, dentist's office wanting to know re:  Pt has appt today for couple teeth removal.  Pt has portacath.  Dr. Buelah Manis wanted to know if pt should require antibiotics prior to procedure, and if so,  What Dr. Humphrey Rolls would recommend. [facility]  Phone   [phone].

## 2014-02-01 NOTE — Telephone Encounter (Signed)
Called Helen [name] back and informed her re:  Per Dr. Humphrey Rolls, ok for pt to have teeth pulled as long as pt's white blood cell counts are within normal limits.  01/24/14  WBC  4.3 ;  ANC  2.9,  Platelet ct  206.  Informed Helen [name] that pt would not need antibiotics prior to procedure  as per Dr. Humphrey Rolls.  Helen [name] voiced understanding.

## 2014-02-14 ENCOUNTER — Other Ambulatory Visit (HOSPITAL_BASED_OUTPATIENT_CLINIC_OR_DEPARTMENT_OTHER): Payer: Medicare Other

## 2014-02-14 ENCOUNTER — Encounter: Payer: Self-pay | Admitting: Oncology

## 2014-02-14 ENCOUNTER — Ambulatory Visit (HOSPITAL_BASED_OUTPATIENT_CLINIC_OR_DEPARTMENT_OTHER): Payer: Medicare Other

## 2014-02-14 ENCOUNTER — Ambulatory Visit (HOSPITAL_BASED_OUTPATIENT_CLINIC_OR_DEPARTMENT_OTHER): Payer: Medicare Other | Admitting: Oncology

## 2014-02-14 VITALS — BP 143/78 | HR 64 | Temp 97.5°F | Resp 18 | Ht 62.0 in | Wt 138.5 lb

## 2014-02-14 VITALS — BP 107/41 | HR 75 | Temp 97.9°F | Resp 18

## 2014-02-14 DIAGNOSIS — C8589 Other specified types of non-Hodgkin lymphoma, extranodal and solid organ sites: Secondary | ICD-10-CM

## 2014-02-14 DIAGNOSIS — Z79899 Other long term (current) drug therapy: Secondary | ICD-10-CM

## 2014-02-14 DIAGNOSIS — C7952 Secondary malignant neoplasm of bone marrow: Secondary | ICD-10-CM

## 2014-02-14 DIAGNOSIS — Z5112 Encounter for antineoplastic immunotherapy: Secondary | ICD-10-CM

## 2014-02-14 DIAGNOSIS — C50919 Malignant neoplasm of unspecified site of unspecified female breast: Secondary | ICD-10-CM

## 2014-02-14 DIAGNOSIS — C8590 Non-Hodgkin lymphoma, unspecified, unspecified site: Secondary | ICD-10-CM

## 2014-02-14 DIAGNOSIS — C7951 Secondary malignant neoplasm of bone: Secondary | ICD-10-CM

## 2014-02-14 DIAGNOSIS — Z853 Personal history of malignant neoplasm of breast: Secondary | ICD-10-CM

## 2014-02-14 DIAGNOSIS — Z5181 Encounter for therapeutic drug level monitoring: Secondary | ICD-10-CM

## 2014-02-14 LAB — CBC WITH DIFFERENTIAL/PLATELET
BASO%: 1 % (ref 0.0–2.0)
Basophils Absolute: 0 10*3/uL (ref 0.0–0.1)
EOS%: 6.3 % (ref 0.0–7.0)
Eosinophils Absolute: 0.3 10*3/uL (ref 0.0–0.5)
HCT: 34.6 % — ABNORMAL LOW (ref 34.8–46.6)
HGB: 11.2 g/dL — ABNORMAL LOW (ref 11.6–15.9)
LYMPH%: 7.6 % — AB (ref 14.0–49.7)
MCH: 28 pg (ref 25.1–34.0)
MCHC: 32.5 g/dL (ref 31.5–36.0)
MCV: 86.1 fL (ref 79.5–101.0)
MONO#: 0.7 10*3/uL (ref 0.1–0.9)
MONO%: 15.9 % — ABNORMAL HIGH (ref 0.0–14.0)
NEUT#: 3.2 10*3/uL (ref 1.5–6.5)
NEUT%: 69.2 % (ref 38.4–76.8)
PLATELETS: 265 10*3/uL (ref 145–400)
RBC: 4.02 10*6/uL (ref 3.70–5.45)
RDW: 14.8 % — AB (ref 11.2–14.5)
WBC: 4.7 10*3/uL (ref 3.9–10.3)
lymph#: 0.4 10*3/uL — ABNORMAL LOW (ref 0.9–3.3)

## 2014-02-14 LAB — COMPREHENSIVE METABOLIC PANEL (CC13)
ALBUMIN: 3.9 g/dL (ref 3.5–5.0)
ALT: 13 U/L (ref 0–55)
AST: 17 U/L (ref 5–34)
Alkaline Phosphatase: 86 U/L (ref 40–150)
Anion Gap: 10 mEq/L (ref 3–11)
BUN: 10.5 mg/dL (ref 7.0–26.0)
CALCIUM: 10.1 mg/dL (ref 8.4–10.4)
CHLORIDE: 107 meq/L (ref 98–109)
CO2: 26 mEq/L (ref 22–29)
Creatinine: 0.8 mg/dL (ref 0.6–1.1)
Glucose: 82 mg/dl (ref 70–140)
POTASSIUM: 4.1 meq/L (ref 3.5–5.1)
SODIUM: 142 meq/L (ref 136–145)
Total Bilirubin: 0.25 mg/dL (ref 0.20–1.20)
Total Protein: 7.2 g/dL (ref 6.4–8.3)

## 2014-02-14 LAB — HEPATITIS B SURFACE ANTIGEN: HEP B S AG: NEGATIVE

## 2014-02-14 LAB — HEPATITIS B CORE ANTIBODY, IGM: HEP B C IGM: NONREACTIVE

## 2014-02-14 LAB — HEPATITIS B CORE ANTIBODY, TOTAL: Hep B Core Total Ab: NONREACTIVE

## 2014-02-14 LAB — HEPATITIS B SURFACE ANTIBODY,QUALITATIVE: Hep B S Ab: NEGATIVE

## 2014-02-14 LAB — LACTATE DEHYDROGENASE (CC13): LDH: 220 U/L (ref 125–245)

## 2014-02-14 MED ORDER — ACETAMINOPHEN 325 MG PO TABS
ORAL_TABLET | ORAL | Status: AC
  Filled 2014-02-14: qty 2

## 2014-02-14 MED ORDER — SODIUM CHLORIDE 0.9 % IV SOLN
375.0000 mg/m2 | Freq: Once | INTRAVENOUS | Status: AC
  Administered 2014-02-14: 600 mg via INTRAVENOUS
  Filled 2014-02-14: qty 60

## 2014-02-14 MED ORDER — ACETAMINOPHEN 325 MG PO TABS
650.0000 mg | ORAL_TABLET | Freq: Once | ORAL | Status: AC
  Administered 2014-02-14: 650 mg via ORAL

## 2014-02-14 MED ORDER — SODIUM CHLORIDE 0.9 % IJ SOLN
10.0000 mL | INTRAMUSCULAR | Status: DC | PRN
  Administered 2014-02-14: 10 mL
  Filled 2014-02-14: qty 10

## 2014-02-14 MED ORDER — HEPARIN SOD (PORK) LOCK FLUSH 100 UNIT/ML IV SOLN
500.0000 [IU] | Freq: Once | INTRAVENOUS | Status: AC | PRN
  Administered 2014-02-14: 500 [IU]
  Filled 2014-02-14: qty 5

## 2014-02-14 MED ORDER — DIPHENHYDRAMINE HCL 25 MG PO CAPS
50.0000 mg | ORAL_CAPSULE | Freq: Once | ORAL | Status: AC
  Administered 2014-02-14: 50 mg via ORAL

## 2014-02-14 MED ORDER — SODIUM CHLORIDE 0.9 % IV SOLN
Freq: Once | INTRAVENOUS | Status: AC
  Administered 2014-02-14: 09:00:00 via INTRAVENOUS

## 2014-02-14 MED ORDER — DIPHENHYDRAMINE HCL 25 MG PO CAPS
ORAL_CAPSULE | ORAL | Status: AC
  Filled 2014-02-14: qty 2

## 2014-02-14 NOTE — Patient Instructions (Signed)
Proceed with Rituxan today  We will set you up for restaging scans to be done prior to your next visit  I will see you back in 3 weeks.

## 2014-02-14 NOTE — Progress Notes (Signed)
Completed and faxed Medicaid transportation form.  Sent to scan.

## 2014-02-14 NOTE — Progress Notes (Signed)
Labs within treatment parameters. Patient tolerated infusion well. Discharged in no acute distress, transportation called for patient.

## 2014-02-14 NOTE — Patient Instructions (Signed)
Cancer Center Discharge Instructions for Patients Receiving Chemotherapy  Today you received the following chemotherapy agents: Rituxan   To help prevent nausea and vomiting after your treatment, we encourage you to take your nausea medication as prescribed by your physician.    If you develop nausea and vomiting that is not controlled by your nausea medication, call the clinic.   BELOW ARE SYMPTOMS THAT SHOULD BE REPORTED IMMEDIATELY:  *FEVER GREATER THAN 100.5 F  *CHILLS WITH OR WITHOUT FEVER  NAUSEA AND VOMITING THAT IS NOT CONTROLLED WITH YOUR NAUSEA MEDICATION  *UNUSUAL SHORTNESS OF BREATH  *UNUSUAL BRUISING OR BLEEDING  TENDERNESS IN MOUTH AND THROAT WITH OR WITHOUT PRESENCE OF ULCERS  *URINARY PROBLEMS  *BOWEL PROBLEMS  UNUSUAL RASH Items with * indicate a potential emergency and should be followed up as soon as possible.  Feel free to call the clinic you have any questions or concerns. The clinic phone number is [phone].    

## 2014-02-14 NOTE — Progress Notes (Signed)
[place] OFFICE PROGRESS NOTE  Patient Care Team: No Pcp Per Patient as PCP - General (General Practice) Amada Kingfisher, MD as Consulting Physician (Hematology and Oncology)  DIAGNOSIS: 79 year old female with:   #1 left breast carcinoma originally presenting as a fungating mass. Patient underwent neoadjuvant chemotherapy followed by mastectomy. She is currently on Arimidex 1mg  daily.  #2 low-grade non-Hodgkin lymphoma status post CVP and Rituxan now receiving R-Bendamustine, followed by maintenance Rituxan.    SUMMARY OF ONCOLOGIC HISTORY: #1 the patient was diagnosed with metastatic breast carcinoma beginning October 2009. She returned her 1 chemotherapy followed by mastectomy radiation. She was on letrozole 2.5 mg daily, but has changed to Arimidex 1mg  daily. Also receives Xgeva every 28 days.  #2 Was diagnosed with low-grade non-Hodgkin lymphoma 12. She received 6 cycles of CVP and Rituxan, followed by maintenance Rituxan, who recurred and is now on R-Bendamustine.  #3 Pt underwent 4 cycles of R-Bendamustin PET/CT on 1/10 showed CR to therapy, will complete 2 cycles of treatment and the proceed to maintenance Rituxan.  #4 Maintenance Rituximab every 3 weeks started on 01/28/13. Last CT scans on 07/22/13 of chest/abdomen/pelvis show no recurrence of lymphoma and stable bone disease.   CURRENT THERAPY: Arimidex daily, Xgeva (currently on hold), and Maintenance Rituximab cycle # 15   INTERVAL HISTORY: Helen [name] 79 y.o. female returns for followup visit prior to receiving cycle number 15 of maintenance Rituxan. Clinically she seems to be doing well. She does have some tingling in her feet. When asked whether or not it is worse she says states it is about the same. She is denying any nausea or vomiting. She does have generalized aches and pains. This is most likely due to her Arimidex. Patient does tell me that she has had her teeth extracted approximately 2 weeks ago. She  tolerated the procedure well without any significant problems. This means that we may be able to get her started on Xgeva in the next 3-6 weeks. I would like to make sure that she has healed well. Remainder of the 10 point review of systems is negative and as below.  Past Medical History  Diagnosis Date  . Arthritis   . Blood transfusion 2009  . Breast CA 08/2008    (LT) breast ca dx 10/09/Chemo  . Lymphoma 09/09/2011    NHL  . Leukemia, acute, in remission 2012    Pt. not sure of type  . Hypertension   . Seizures 1980's    from medication reaction/Pt.  [name] Hx of radiation therapy 09/11/08 -10/31/08    left breast  . Metastasis to bone 07/03/2012    MRI L spine  . History of radiation therapy eot 07/30/12    lumbar spine L4 /l shoulder    History   Social History  . Marital Status: Widowed    Spouse Name: N/A    Number of Children: N/A  . Years of Education: N/A   Occupational History  . Not on file.   Social History Main Topics  . Smoking status: Never Smoker   . Smokeless tobacco: Never Used  . Alcohol Use: No  . Drug Use: No  . Sexual Activity: Not Currently   Other Topics Concern  . Not on file   Social History Narrative  . No narrative on file   Past Surgical History  Procedure Laterality Date  . Mastectomy    . Thyroidectomy  1965  . Appendectomy    . Tonsillectomy  ALLERGIES:  is allergic to codeine; penicillins; sulfonamide derivatives; tape; and tramadol hcl.  MEDICATIONS:  Current Outpatient Prescriptions  Medication Sig Dispense Refill  . anastrozole (ARIMIDEX) 1 MG tablet Take 1 tablet (1 mg total) by mouth daily.  30 tablet  5  . calcium-vitamin D (OSCAL WITH D) 500-200 MG-UNIT per tablet Take 1 tablet by mouth daily.       [name] gatifloxacin (ZYMAXID) 0.5 % SOLN Place 1 drop into both eyes 4 (four) times daily.      . prednisoLONE acetate (PRED FORTE) 1 % ophthalmic suspension Place 1 drop into both eyes 4 (four) times daily.      [name]  acetaminophen (TYLENOL) 500 MG tablet Take 500 mg by mouth every 6 (six) hours as needed for mild pain.      [name] oxyCODONE-acetaminophen (PERCOCET/ROXICET) 5-325 MG per tablet Take 2 tablets by mouth every 4 (four) hours as needed for severe pain.  30 tablet  0   No current facility-administered medications for this visit.    REVIEW OF SYSTEMS:   Constitutional: Denies fevers, chills or abnormal weight loss Eyes: Denies blurriness of vision Ears, nose, mouth, throat, and face: Denies mucositis or sore throat Respiratory: Denies cough, dyspnea or wheezes Cardiovascular: Denies palpitation, chest discomfort or lower extremity swelling Gastrointestinal:  Denies nausea, heartburn or change in bowel habits Skin: Denies abnormal skin rashes Lymphatics: Denies new lymphadenopathy or easy bruising Neurological:Denies numbness, minimal tingling in her feet. Behavioral/Psych: Mood is stable, no new changes  All other systems were reviewed with the patient and are negative.  PHYSICAL EXAMINATION: ECOG PERFORMANCE STATUS: 1 - Symptomatic but completely ambulatory  Filed Vitals:   02/14/14 0828  BP: 143/78  Pulse: 64  Temp: 97.5 F (36.4 C)  Resp: 18   Filed Weights   02/14/14 0828  Weight: 138 lb 8 oz (62.823 kg)    GENERAL:alert, no distress and comfortable SKIN: skin color, texture, turgor are normal, no rashes or significant lesions EYES: normal, Conjunctiva are pink and non-injected, sclera clear OROPHARYNX:no exudate, no erythema and lips, buccal mucosa, and tongue normal  NECK: supple, thyroid normal size, non-tender, without nodularity LYMPH:  no palpable lymphadenopathy in the cervical, axillary or inguinal LUNGS: clear to auscultation and percussion with normal breathing effort HEART: regular rate & rhythm and no murmurs and no lower extremity edema ABDOMEN:abdomen soft, non-tender and normal bowel sounds Musculoskeletal:no cyanosis of digits and no clubbing  Right mastectomy  site looks well-healed no evidence of local recurrence Port-A-Cath: No evidence of infection NEURO: alert & oriented x 3 with fluent speech, no focal motor/sensory deficits Breasts: right breast normal without mass, skin or nipple changes or axillary nodes, post-mastectomy site well healed and free of suspicious changes on the left  LABORATORY DATA:  I have reviewed the data as listed   No results found for this basename: SPEP,  UPEP,   kappa and lambda light chains    Lab Results  Component Value Date   WBC 4.7 02/14/2014   NEUTROABS 3.2 02/14/2014   HGB 11.2* 02/14/2014   HCT 34.6* 02/14/2014   MCV 86.1 02/14/2014   PLT 265 02/14/2014             RADIOGRAPHIC STUDIES: I have personally reviewed the radiological images as listed and agreed with the findings in the report. No results found.    ASSESSMENT & PLAN:  79 year old female with  #1 history of low-grade non-Hodgkin lymphoma originally treated with CVP Rituxan and then maintenance Rituxan. She  subsequently had relapse and she got Rituxan bendamustine. She had restaging scans performed that demonstrated significant response to therapy to Rituxan and bendamustine. We therefore completed a total of 6 cycles of this. Patient is seen today for cycle 15 of maintenance Rituxan. She's tolerating it well. She will proceed with this. She will return in 3 weeks for cycle 16. She does need restaging scans. I have ordered these for her to be done prior to her next visit with me.  #2 metastatic breast carcinoma: She is on Arimidex tolerating it well. Patient was having some back pain and on 3/8 she had MRI performed that does reveal slight progressive disease. But she is asymptomatic now. We will continue to follow. I have reviewed the MRI results with the patient.  #3 bony disease: Patient finally has had her dental extraction performed. I have discussed restarting Xgeva with her today. We will hold off until about 3-6 weeks. We discussed  side effects of this medication. She understands.  #4 patient will be seen back in 3 weeks for cycle 16 of Rituxan.  No orders of the defined types were placed in this encounter.   All questions were answered. The patient knows to call the clinic with any problems, questions or concerns. No barriers to learning was detected. I spent 61minutes counseling the patient face to face. The total time spent in the appointment was 30 minutes and more than 50% was on counseling and review of test results and coordination of care     Deatra Robinson, MD 02/14/2014 8:49 AM

## 2014-02-17 ENCOUNTER — Telehealth: Payer: Self-pay | Admitting: *Deleted

## 2014-02-17 ENCOUNTER — Telehealth: Payer: Self-pay | Admitting: Oncology

## 2014-02-17 NOTE — Telephone Encounter (Signed)
s/w pt re next appt for 5/5. pt to get schedule when she comes in.

## 2014-02-17 NOTE — Telephone Encounter (Signed)
Per staff message and POF I have scheduled appts.  JMW  

## 2014-02-20 ENCOUNTER — Encounter (HOSPITAL_COMMUNITY): Payer: Self-pay

## 2014-02-20 ENCOUNTER — Ambulatory Visit (HOSPITAL_COMMUNITY)
Admission: RE | Admit: 2014-02-20 | Discharge: 2014-02-20 | Disposition: A | Discharge status: Home / Self Care | Payer: Medicare Other | Source: Ambulatory Visit | Attending: Oncology | Admitting: Oncology

## 2014-02-20 DIAGNOSIS — Z9010 Acquired absence of unspecified breast and nipple: Secondary | ICD-10-CM | POA: Insufficient documentation

## 2014-02-20 DIAGNOSIS — C8589 Other specified types of non-Hodgkin lymphoma, extranodal and solid organ sites: Secondary | ICD-10-CM | POA: Insufficient documentation

## 2014-02-20 DIAGNOSIS — K7689 Other specified diseases of liver: Secondary | ICD-10-CM | POA: Insufficient documentation

## 2014-02-20 DIAGNOSIS — K439 Ventral hernia without obstruction or gangrene: Secondary | ICD-10-CM | POA: Insufficient documentation

## 2014-02-20 DIAGNOSIS — C50919 Malignant neoplasm of unspecified site of unspecified female breast: Secondary | ICD-10-CM

## 2014-02-20 DIAGNOSIS — K4020 Bilateral inguinal hernia, without obstruction or gangrene, not specified as recurrent: Secondary | ICD-10-CM | POA: Insufficient documentation

## 2014-02-20 DIAGNOSIS — C8590 Non-Hodgkin lymphoma, unspecified, unspecified site: Secondary | ICD-10-CM

## 2014-02-20 DIAGNOSIS — Z853 Personal history of malignant neoplasm of breast: Secondary | ICD-10-CM | POA: Insufficient documentation

## 2014-02-20 DIAGNOSIS — Z923 Personal history of irradiation: Secondary | ICD-10-CM | POA: Insufficient documentation

## 2014-02-20 DIAGNOSIS — Z9221 Personal history of antineoplastic chemotherapy: Secondary | ICD-10-CM | POA: Insufficient documentation

## 2014-02-20 DIAGNOSIS — I700 Atherosclerosis of aorta: Secondary | ICD-10-CM | POA: Insufficient documentation

## 2014-02-20 DIAGNOSIS — K449 Diaphragmatic hernia without obstruction or gangrene: Secondary | ICD-10-CM | POA: Insufficient documentation

## 2014-02-20 DIAGNOSIS — D259 Leiomyoma of uterus, unspecified: Secondary | ICD-10-CM | POA: Insufficient documentation

## 2014-02-20 DIAGNOSIS — N281 Cyst of kidney, acquired: Secondary | ICD-10-CM | POA: Insufficient documentation

## 2014-02-20 DIAGNOSIS — R918 Other nonspecific abnormal finding of lung field: Secondary | ICD-10-CM | POA: Insufficient documentation

## 2014-02-20 DIAGNOSIS — J984 Other disorders of lung: Secondary | ICD-10-CM | POA: Insufficient documentation

## 2014-02-20 MED ORDER — IOHEXOL 300 MG/ML  SOLN
50.0000 mL | Freq: Once | INTRAMUSCULAR | Status: AC | PRN
  Administered 2014-02-20: 50 mL via ORAL

## 2014-02-20 MED ORDER — IOHEXOL 300 MG/ML  SOLN: 50.0000 mL | Freq: Once | INTRAMUSCULAR | Status: DC | PRN

## 2014-02-20 MED ORDER — IOHEXOL 300 MG/ML  SOLN
100.0000 mL | Freq: Once | INTRAMUSCULAR | Status: AC | PRN
  Administered 2014-02-20: 100 mL via INTRAVENOUS

## 2014-02-22 ENCOUNTER — Encounter: Payer: Self-pay | Admitting: *Deleted

## 2014-02-22 NOTE — Progress Notes (Signed)
[place] Work  Clinical Social Work was referred by nurse for assessment of psychosocial needs due to need for appointment and transportation issues.  Clinical Social Worker phoned pt at home and she reports to not have any family or friends that can assist her tomorrow due to their work. CSW was able to arrange transportation through [facility] office and they will pick her up in the am in time for her appointment. CSW notified pt and RN of transportation being in place for tomorrow, both were appreciative.     Clinical Social Work interventions: Higher education careers adviser and arrangement  Loren Racer, [place] Social Worker [facility] for [place] Wednesday, Thursday and Friday Phone: [phone] Fax: [phone]

## 2014-02-23 ENCOUNTER — Telehealth: Payer: Self-pay | Admitting: Hematology and Oncology

## 2014-02-23 ENCOUNTER — Ambulatory Visit (HOSPITAL_BASED_OUTPATIENT_CLINIC_OR_DEPARTMENT_OTHER): Payer: Medicare Other | Admitting: Hematology and Oncology

## 2014-02-23 VITALS — BP 143/78 | HR 86 | Temp 97.4°F | Resp 18 | Ht 62.0 in | Wt 139.6 lb

## 2014-02-23 DIAGNOSIS — R599 Enlarged lymph nodes, unspecified: Secondary | ICD-10-CM

## 2014-02-23 DIAGNOSIS — C8590 Non-Hodgkin lymphoma, unspecified, unspecified site: Secondary | ICD-10-CM

## 2014-02-23 DIAGNOSIS — C50919 Malignant neoplasm of unspecified site of unspecified female breast: Secondary | ICD-10-CM

## 2014-02-23 DIAGNOSIS — C8589 Other specified types of non-Hodgkin lymphoma, extranodal and solid organ sites: Secondary | ICD-10-CM

## 2014-02-23 DIAGNOSIS — C7952 Secondary malignant neoplasm of bone marrow: Secondary | ICD-10-CM

## 2014-02-23 DIAGNOSIS — C7951 Secondary malignant neoplasm of bone: Secondary | ICD-10-CM

## 2014-02-23 NOTE — Telephone Encounter (Signed)
Gave pt appt for MD only , left message with Thayer Headings CCS regarding biopsy

## 2014-02-23 NOTE — Progress Notes (Signed)
[facility] FOLLOW-UP progress notes  Patient Care Team: No Pcp Per Patient as PCP - General (General Practice) Amada Kingfisher, MD as Consulting Physician (Hematology and Oncology)  CHIEF COMPLAINTS/PURPOSE OF VISIT:  Enlarged lymph nodes, history of small lymphocytic lymphoma and metastatic breast cancer HISTORY OF PRESENTING ILLNESS:  Helen [name] 79 y.o. female was transferred to my care after her prior physician has left.  I reviewed the patient's records extensive and collaborated the history with the patient. Summary of her history is as follows:  #1 left breast carcinoma originally presenting as a fungating mass and was diagnosed with metastatic breast cancer in October 2009. Patient underwent neoadjuvant chemotherapy followed by mastectomy. She is currently on Arimidex 1mg  daily and monthly Xgeva. 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 She received 6 cycles of CVP and Rituxan, followed by maintenance Rituxan. Later, her disease recurred and underwent 4 cycles of R-Bendamustine. PET/CT showed CR and then she proceeded to maintenance Rituxan.   She is asked to come in as an emergency consult due to recent abnormal CT scan. She denies any palpable lymphadenopathy. She denies any recent fever, chills, night sweats or abnormal weight loss In regards to breast cancer diagnosis, she denies new bone pain. She denies any recent abnormal breast examination, palpable mass, abnormal breast appearance or nipple changes  MEDICAL HISTORY:  Past Medical History  Diagnosis Date  . Arthritis   . Blood transfusion 2009  . Hypertension   . Seizures 1980's    from medication reaction/Pt.  [name] Hx of radiation therapy 09/11/08 -10/31/08    left breast  . History of radiation therapy eot 07/30/12    lumbar spine L4 /l shoulder  . Breast CA 08/2008    (LT) breast ca dx 10/09/Chemo  .  Lymphoma 09/09/2011    NHL  . Leukemia, acute, in remission 2012    Pt. not sure of type  . Metastasis to bone 07/03/2012    MRI L spine    SURGICAL HISTORY: Past Surgical History  Procedure Laterality Date  . Mastectomy    . Thyroidectomy  1965  . Appendectomy    . Tonsillectomy      SOCIAL HISTORY: History   Social History  . Marital Status: Widowed    Spouse Name: N/A    Number of Children: N/A  . Years of Education: N/A   Occupational History  . Not on file.   Social History Main Topics  . Smoking status: Never Smoker   . Smokeless tobacco: Never Used  . Alcohol Use: No  . Drug Use: No  . Sexual Activity: Not Currently   Other Topics Concern  . Not on file   Social History Narrative  . No narrative on file    FAMILY HISTORY: Family History  Problem Relation Age of Onset  . Alcohol abuse Other   . Drug abuse Other   . Breast cancer Mother   . Prostate cancer Father   . Cancer Father     colon  . Colon cancer Brother   . Esophageal cancer Neg Hx   . Rectal cancer Neg Hx   . Stomach cancer Neg Hx     ALLERGIES:  is allergic to codeine; penicillins; sulfonamide derivatives; tape; and tramadol hcl.  MEDICATIONS:  Current Outpatient Prescriptions  Medication Sig Dispense Refill  . anastrozole (ARIMIDEX) 1 MG tablet Take 1 tablet (1 mg total) by mouth daily.  30 tablet  5  . calcium-vitamin D (OSCAL WITH D) 500-200 MG-UNIT per tablet Take 1 tablet by mouth daily.       [name] gatifloxacin (ZYMAXID) 0.5 % SOLN Place 1 drop into both eyes 4 (four) times daily.      . prednisoLONE acetate (PRED FORTE) 1 % ophthalmic suspension Place 1 drop into both eyes 4 (four) times daily.       No current facility-administered medications for this visit.    REVIEW OF SYSTEMS:   Constitutional: Denies fevers, chills or abnormal night sweats Eyes: Denies blurriness of vision, double vision or watery eyes Ears, nose, mouth, throat, and face: Denies mucositis or sore  throat Respiratory: Denies cough, dyspnea or wheezes Cardiovascular: Denies palpitation, chest discomfort or lower extremity swelling Gastrointestinal:  Denies nausea, heartburn or change in bowel habits Skin: Denies abnormal skin rashes Lymphatics: Denies new lymphadenopathy or easy bruising Neurological:Denies numbness, tingling or new weaknesses Behavioral/Psych: Mood is stable, no new changes  All other systems were reviewed with the patient and are negative.  PHYSICAL EXAMINATION: ECOG PERFORMANCE STATUS: 0 - Asymptomatic  Filed Vitals:   02/23/14 0831  BP: 143/78  Pulse: 86  Temp: 97.4 F (36.3 C)  Resp: 18   Filed Weights   02/23/14 0831  Weight: 139 lb 9.6 oz (63.322 kg)    GENERAL:alert, no distress and comfortable SKIN: skin color, texture, turgor are normal, no rashes or significant lesions EYES: normal, conjunctiva are pink and non-injected, sclera clear OROPHARYNX:no exudate, normal lips, buccal mucosa, and tongue  NECK: supple, thyroid normal size, non-tender, without nodularity LYMPH:  no palpable lymphadenopathy in the cervical, axillary or inguinal LUNGS: clear to auscultation and percussion with normal breathing effort HEART: regular rate & rhythm and no murmurs without lower extremity edema ABDOMEN:abdomen soft, non-tender and normal bowel sounds Musculoskeletal:no cyanosis of digits and no clubbing  PSYCH: alert & oriented x 3 with fluent speech NEURO: no focal motor/sensory deficits She has normal breast examination on the right. On the left, well-healed mastectomy scar with no palpable abnormalities. LABORATORY DATA:  I have reviewed the data as listed Lab Results  Component Value Date   WBC 4.7 02/14/2014   HGB 11.2* 02/14/2014   HCT 34.6* 02/14/2014   MCV 86.1 02/14/2014   PLT 265 02/14/2014    Recent Labs  03/11/13 0848 04/01/13 0911 04/22/13 0922  01/03/14 0942 01/24/14 0821 02/14/14 0811  NA 140 141 139  < > 141 141 142  K 3.8 3.6 4.0   < > 4.0 4.1 4.1  CL 107 106 107  --   --   --   --   CO2 23 27 23   < > 25 25 26   GLUCOSE 94 85 89  < > 80 86 82  BUN 13.7 8.4 10.3  < > 11.4 14.1 10.5  CREATININE 0.8 0.7 0.7  < > 0.8 0.8 0.8  CALCIUM 9.7 10.0 9.8  < > 10.4 10.4 10.1  PROT 6.9 7.1 7.1  < > 7.2 7.2 7.2  ALBUMIN 3.6 3.6 3.7  < > 4.0 4.1 3.9  AST 15 17 12   < > 19 22 17   ALT 9 15 8   < > 16 20 13   ALKPHOS 65 63 66  < > 74 83 86  BILITOT 0.31 0.29 0.25  < > 0.24 0.23 0.25  < > = values in this interval not displayed.  RADIOGRAPHIC STUDIES: I reviewed the imaging study with the patient. I have personally reviewed the radiological images as  listed and agreed with the findings in the report. Ct Chest W Contrast  02/20/2014   CLINICAL DATA:  Low grade non-Hodgkin's lymphoma diagnosed 2012, chemotherapy in progress. Breast cancer diagnosed 2009, status post left mastectomy, XRT complete.  EXAM: CT CHEST, ABDOMEN, AND PELVIS WITH CONTRAST  TECHNIQUE: Multidetector CT imaging of the chest, abdomen and pelvis was performed following the standard protocol during bolus administration of intravenous contrast.  CONTRAST:  162mL OMNIPAQUE IOHEXOL 300 MG/ML  SOLN  COMPARISON:  07/22/2013  FINDINGS: CT CHEST FINDINGS  Progressive nodularity along the right major fissure, measuring up to 9 x 7 mm (series 4/ image 34). Additional pleural-based nodularity at the right lung base (series 4/ image 40). This appearance is worrisome for tumor recurrence.  Radiation changes in the left lung apex in the anterior left upper lobe.  Mild left lower lobe scarring with associated 8 mm nodule at the lateral left lung base (series 4/ image 41), more conspicuous than on prior studies, although indeterminate.  No pleural effusion or pneumothorax.  Visualized thyroid is grossly unremarkable.  The heart is normal in size. No pericardial effusion. Atherosclerotic calcifications of the aortic arch.  1.5 cm short axis right axillary node (series 2/ image 20), previously 5 mm.  Additional 10 mm short axis right juxta diaphragmatic node (series 2/ image 35), increased.  Right chest port.  Status post left mastectomy.  Small hiatal hernia.  Exaggerated upper thoracic kyphosis.  CT ABDOMEN AND PELVIS FINDINGS  Vague 2.1 x 1.7 cm hypoenhancing lesion in the inferior right hepatic lobe (series 2/ image 49), poorly evaluated due to phase of enhancement.  Spleen, pancreas, and adrenal glands are within normal limits.  Gallbladder is notable for a 2.5 cm gallstone. No associated inflammatory changes.  Bilateral renal cysts, measuring up to 1.8 cm in the lateral left lower pole (series 6/image 3). No hydronephrosis.  No evidence of bowel obstruction. Colonic diverticulosis, without associated inflammatory changes.  Atherosclerotic calcifications of the abdominal aorta and branch vessels.  No abdominopelvic ascites.  No suspicious abdominopelvic lymphadenopathy.  Calcified uterine fibroids.  No adnexal masses.  Bladder is within normal limits.  Small fat containing right paramidline ventral hernia (series 2/ image 33). Tiny fat containing bilateral inguinal hernias.  Spondylolisthesis at L5-S1. Stable osseous metastasis at L4 (series 2/ image 54). Stable mixed lytic/sclerotic lesion in the right iliac bone (series 2/ image 78). Permeative lytic lesion involving the left iliac bone with associated soft tissue component (series 2/ image 71), progressed.  IMPRESSION: Right axillary and right juxta diaphragmatic lymphadenopathy, new/increased, suggesting lymphomatous recurrence.  Progressive pleural-based nodularity in the right hemithorax, also worrisome for recurrence. Mild nodularity at the left lung base, indeterminate.  Progressive permeative lytic metastasis involving the left iliac bone. Additional osseous metastases are unchanged.  Status post left mastectomy with radiation changes involving the left upper lobe.   Electronically Signed   By: Julian Hy M.D.   On: 02/20/2014 17:18   Ct  Abdomen Pelvis W Contrast  02/20/2014   CLINICAL DATA:  Low grade non-Hodgkin's lymphoma diagnosed 2012, chemotherapy in progress. Breast cancer diagnosed 2009, status post left mastectomy, XRT complete.  EXAM: CT CHEST, ABDOMEN, AND PELVIS WITH CONTRAST  TECHNIQUE: Multidetector CT imaging of the chest, abdomen and pelvis was performed following the standard protocol during bolus administration of intravenous contrast.  CONTRAST:  137mL OMNIPAQUE IOHEXOL 300 MG/ML  SOLN  COMPARISON:  07/22/2013  FINDINGS: CT CHEST FINDINGS  Progressive nodularity along the right major fissure, measuring up  to 9 x 7 mm (series 4/ image 34). Additional pleural-based nodularity at the right lung base (series 4/ image 40). This appearance is worrisome for tumor recurrence.  Radiation changes in the left lung apex in the anterior left upper lobe.  Mild left lower lobe scarring with associated 8 mm nodule at the lateral left lung base (series 4/ image 41), more conspicuous than on prior studies, although indeterminate.  No pleural effusion or pneumothorax.  Visualized thyroid is grossly unremarkable.  The heart is normal in size. No pericardial effusion. Atherosclerotic calcifications of the aortic arch.  1.5 cm short axis right axillary node (series 2/ image 20), previously 5 mm. Additional 10 mm short axis right juxta diaphragmatic node (series 2/ image 35), increased.  Right chest port.  Status post left mastectomy.  Small hiatal hernia.  Exaggerated upper thoracic kyphosis.  CT ABDOMEN AND PELVIS FINDINGS  Vague 2.1 x 1.7 cm hypoenhancing lesion in the inferior right hepatic lobe (series 2/ image 49), poorly evaluated due to phase of enhancement.  Spleen, pancreas, and adrenal glands are within normal limits.  Gallbladder is notable for a 2.5 cm gallstone. No associated inflammatory changes.  Bilateral renal cysts, measuring up to 1.8 cm in the lateral left lower pole (series 6/image 3). No hydronephrosis.  No evidence of bowel  obstruction. Colonic diverticulosis, without associated inflammatory changes.  Atherosclerotic calcifications of the abdominal aorta and branch vessels.  No abdominopelvic ascites.  No suspicious abdominopelvic lymphadenopathy.  Calcified uterine fibroids.  No adnexal masses.  Bladder is within normal limits.  Small fat containing right paramidline ventral hernia (series 2/ image 27). Tiny fat containing bilateral inguinal hernias.  Spondylolisthesis at L5-S1. Stable osseous metastasis at L4 (series 2/ image 25). Stable mixed lytic/sclerotic lesion in the right iliac bone (series 2/ image 78). Permeative lytic lesion involving the left iliac bone with associated soft tissue component (series 2/ image 71), progressed.  IMPRESSION: Right axillary and right juxta diaphragmatic lymphadenopathy, new/increased, suggesting lymphomatous recurrence.  Progressive pleural-based nodularity in the right hemithorax, also worrisome for recurrence. Mild nodularity at the left lung base, indeterminate.  Progressive permeative lytic metastasis involving the left iliac bone. Additional osseous metastases are unchanged.  Status post left mastectomy with radiation changes involving the left upper lobe.   Electronically Signed   By: Julian Hy M.D.   On: 02/20/2014 17:18   ASSESSMENT & PLAN:  #1 metastatic breast cancer to the bone She will continue on Arimidex for now. #2 Right axillary lymphadenopathy #3 CLL/non-Hodgkin lymphoma I am concerned about disease relapse. I would discontinue rituximab. I recommended excisional biopsy of the enlarged right lymph node to rule out transformation to high grade lymphoma. I will refer her back to her surgeon and see her back in a few weeks to review test results.  Orders Placed This Encounter  Procedures  . Ambulatory referral to General Surgery    Referral Priority:  Routine    Referral Type:  Surgical    Referral Reason:  Specialty Services Required    Referred to  Provider:  Edward Jolly, MD    Requested Specialty:  General Surgery    Number of Visits Requested:  1    All questions were answered. The patient knows to call the clinic with any problems, questions or concerns. I spent 40 minutes counseling the patient face to face. The total time spent in the appointment was 55 minutes and more than 50% was on counseling.     [name], MD  02/23/2014 10:04 PM

## 2014-02-23 NOTE — Telephone Encounter (Signed)
GAve pt appt for MD onlyu, called [facility] left message regarding ASAP request for biopsy , waiting for call back

## 2014-03-07 ENCOUNTER — Ambulatory Visit: Payer: Self-pay

## 2014-03-07 ENCOUNTER — Ambulatory Visit: Payer: Self-pay | Admitting: Oncology

## 2014-03-07 ENCOUNTER — Other Ambulatory Visit: Payer: Self-pay

## 2014-03-08 ENCOUNTER — Other Ambulatory Visit: Payer: Self-pay | Admitting: Hematology and Oncology

## 2014-03-08 ENCOUNTER — Telehealth: Payer: Self-pay | Admitting: *Deleted

## 2014-03-08 DIAGNOSIS — C50919 Malignant neoplasm of unspecified site of unspecified female breast: Secondary | ICD-10-CM

## 2014-03-08 MED ORDER — ANASTROZOLE 1 MG PO TABS: 1.0000 mg | ORAL_TABLET | Freq: Every day | ORAL | Status: DC

## 2014-03-08 NOTE — Telephone Encounter (Signed)
Pt states she needs a refill on anastrazole

## 2014-03-08 NOTE — Telephone Encounter (Signed)
Refill done, pt notified

## 2014-03-14 ENCOUNTER — Encounter: Payer: Self-pay | Admitting: Hematology and Oncology

## 2014-03-14 ENCOUNTER — Telehealth: Payer: Self-pay | Admitting: Hematology and Oncology

## 2014-03-14 ENCOUNTER — Telehealth: Payer: Self-pay | Admitting: *Deleted

## 2014-03-14 ENCOUNTER — Ambulatory Visit (HOSPITAL_BASED_OUTPATIENT_CLINIC_OR_DEPARTMENT_OTHER): Payer: Medicare Other | Admitting: Hematology and Oncology

## 2014-03-14 VITALS — BP 152/55 | HR 72 | Temp 97.5°F | Resp 17 | Ht 62.0 in | Wt 139.7 lb

## 2014-03-14 DIAGNOSIS — R599 Enlarged lymph nodes, unspecified: Secondary | ICD-10-CM

## 2014-03-14 DIAGNOSIS — C50919 Malignant neoplasm of unspecified site of unspecified female breast: Secondary | ICD-10-CM

## 2014-03-14 DIAGNOSIS — C7952 Secondary malignant neoplasm of bone marrow: Secondary | ICD-10-CM

## 2014-03-14 DIAGNOSIS — C7951 Secondary malignant neoplasm of bone: Secondary | ICD-10-CM

## 2014-03-14 DIAGNOSIS — C8589 Other specified types of non-Hodgkin lymphoma, extranodal and solid organ sites: Secondary | ICD-10-CM

## 2014-03-14 NOTE — Progress Notes (Signed)
[name] OFFICE PROGRESS NOTE  Patient Care Team: No Pcp Per Patient as PCP - General (General Practice) Amada Kingfisher, MD as Consulting Physician (Hematology and Oncology)  DIAGNOSIS: Right axillary lymphadenopathy  SUMMARY OF ONCOLOGIC HISTORY:  #1 left breast carcinoma originally presenting as a fungating mass and was diagnosed with metastatic breast cancer in October 2009. Patient underwent neoadjuvant chemotherapy followed by mastectomy. She is currently on Arimidex 1mg  daily and monthly Xgeva. 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 She received 6 cycles of CVP and Rituxan, followed by maintenance Rituxan. Later, her disease recurred and underwent 4 cycles of R-Bendamustine. PET/CT showed CR and then she proceeded to maintenance Rituxan.   In April 2015, repeat CT scan show new lymphadenopathy.  INTERVAL HISTORY: Helen [name] 79 y.o. female returns for further followup. I was made aware this morning her surgical evaluation is not until next week. In the meantime she denies worsening lymphadenopathy. Denies right upper extremity edema  I have reviewed the past medical history, past surgical history, social history and family history with the patient and they are unchanged from previous note.  ALLERGIES:  is allergic to codeine; penicillins; sulfonamide derivatives; tape; and tramadol hcl.  MEDICATIONS:  Current Outpatient Prescriptions  Medication Sig Dispense Refill  . anastrozole (ARIMIDEX) 1 MG tablet Take 1 tablet (1 mg total) by mouth daily.  30 tablet  5  . calcium-vitamin D (OSCAL WITH D) 500-200 MG-UNIT per tablet Take 1 tablet by mouth daily.       . prednisoLONE acetate (PRED FORTE) 1 % ophthalmic suspension Place 1 drop into both eyes 4 (four) times daily.      [name] gatifloxacin (ZYMAXID) 0.5 % SOLN Place 1 drop into both eyes 4 (four) times daily.       No  current facility-administered medications for this visit.    REVIEW OF SYSTEMS:   All other systems were reviewed with the patient and are negative.  PHYSICAL EXAMINATION: ECOG PERFORMANCE STATUS: 0 - Asymptomatic  Filed Vitals:   03/14/14 1030  BP: 152/55  Pulse: 72  Temp: 97.5 F (36.4 C)  Resp: 17   Filed Weights   03/14/14 1030  Weight: 139 lb 11.2 oz (63.368 kg)    GENERAL:alert, no distress and comfortable Musculoskeletal:no cyanosis of digits and no clubbing  NEURO: alert & oriented x 3 with fluent speech, no focal motor/sensory deficits  LABORATORY DATA:  I have reviewed the data as listed    Component Value Date/Time   NA 142 02/14/2014 0811   NA 136 07/06/2012 1122   NA 142 06/01/2012 1008   K 4.1 02/14/2014 0811   K 3.8 07/06/2012 1122   K 3.8 06/01/2012 1008   CL 107 04/22/2013 0922   CL 102 07/06/2012 1122   CL 103 06/01/2012 1008   CO2 26 02/14/2014 0811   CO2 20 07/06/2012 1122   CO2 25 06/01/2012 1008   GLUCOSE 82 02/14/2014 0811   GLUCOSE 89 04/22/2013 0922   GLUCOSE 182* 07/06/2012 1122   GLUCOSE 108 06/01/2012 1008   BUN 10.5 02/14/2014 0811   BUN 8 07/06/2012 1122   BUN 9 06/01/2012 1008   CREATININE 0.8 02/14/2014 0811   CREATININE 0.60 07/06/2012 1122   CREATININE 0.9 06/01/2012 1008   CALCIUM 10.1 02/14/2014 0811   CALCIUM 9.1 07/06/2012 1122   CALCIUM 9.8 06/01/2012 1008   PROT 7.2 02/14/2014 0811   PROT 7.7 02/10/2012 1035  PROT 8.1 08/14/2010 1251   ALBUMIN 3.9 02/14/2014 0811   ALBUMIN 4.3 02/10/2012 1035   AST 17 02/14/2014 0811   AST 20 02/10/2012 1035   AST 19 08/14/2010 1251   ALT 13 02/14/2014 0811   ALT 19 02/10/2012 1035   ALT 9* 08/14/2010 1251   ALKPHOS 86 02/14/2014 0811   ALKPHOS 69 02/10/2012 1035   ALKPHOS 78 08/14/2010 1251   BILITOT 0.25 02/14/2014 0811   BILITOT 0.4 02/10/2012 1035   BILITOT 0.40 08/14/2010 1251   GFRNONAA 87* 07/06/2012 1122   GFRAA >90 07/06/2012 1122    No results found for this basename: SPEP,  UPEP,   kappa and lambda light chains     Lab Results  Component Value Date   WBC 4.7 02/14/2014   NEUTROABS 3.2 02/14/2014   HGB 11.2* 02/14/2014   HCT 34.6* 02/14/2014   MCV 86.1 02/14/2014   PLT 265 02/14/2014      Chemistry      Component Value Date/Time   NA 142 02/14/2014 0811   NA 136 07/06/2012 1122   NA 142 06/01/2012 1008   K 4.1 02/14/2014 0811   K 3.8 07/06/2012 1122   K 3.8 06/01/2012 1008   CL 107 04/22/2013 0922   CL 102 07/06/2012 1122   CL 103 06/01/2012 1008   CO2 26 02/14/2014 0811   CO2 20 07/06/2012 1122   CO2 25 06/01/2012 1008   BUN 10.5 02/14/2014 0811   BUN 8 07/06/2012 1122   BUN 9 06/01/2012 1008   CREATININE 0.8 02/14/2014 0811   CREATININE 0.60 07/06/2012 1122   CREATININE 0.9 06/01/2012 1008      Component Value Date/Time   CALCIUM 10.1 02/14/2014 0811   CALCIUM 9.1 07/06/2012 1122   CALCIUM 9.8 06/01/2012 1008   ALKPHOS 86 02/14/2014 0811   ALKPHOS 69 02/10/2012 1035   ALKPHOS 78 08/14/2010 1251   AST 17 02/14/2014 0811   AST 20 02/10/2012 1035   AST 19 08/14/2010 1251   ALT 13 02/14/2014 0811   ALT 19 02/10/2012 1035   ALT 9* 08/14/2010 1251   BILITOT 0.25 02/14/2014 0811   BILITOT 0.4 02/10/2012 1035   BILITOT 0.40 08/14/2010 1251       RADIOGRAPHIC STUDIES: I reviewed the imaging study again with the patient and she has questions I have personally reviewed the radiological images as listed and agreed with the findings in the report.   ASSESSMENT & PLAN:  #1 metastatic breast cancer to the bone She will continue on Arimidex for now. #2 Right axillary lymphadenopathy #3 CLL/non-Hodgkin lymphoma I am concerned about disease relapse. I have discontinued rituximab. I recommended excisional biopsy of the enlarged right lymph node to rule out transformation to high grade lymphoma. I will see her back in a few weeks to review test results.   All questions were answered. The patient knows to call the clinic with any problems, questions or concerns. No barriers to learning was detected. I spent 10 minutes  counseling the patient face to face. The total time spent in the appointment was 15 minutes and more than 50% was on counseling and review of test results     [name], MD 03/14/2014 11:00 AM

## 2014-03-14 NOTE — Telephone Encounter (Signed)
Informed pt that Dr. Alvy Bimler does not need to see pt today.  We can wait to see pt until after her biopsy and we have results.  Pt said "She should have known that and told me before. I am coming in for my appt today and she can talk to me about it at my appt."  Informed pt we will see her when she gets here. Notified Dr. Alvy Bimler pt intends to keep her appt today.

## 2014-03-14 NOTE — Telephone Encounter (Signed)
gv adn printed appt sched and avs for pt for May °

## 2014-03-15 ENCOUNTER — Ambulatory Visit: Payer: Self-pay | Admitting: Hematology and Oncology

## 2014-03-21 ENCOUNTER — Ambulatory Visit (INDEPENDENT_AMBULATORY_CARE_PROVIDER_SITE_OTHER): Payer: Medicare Other | Admitting: General Surgery

## 2014-03-21 ENCOUNTER — Encounter (INDEPENDENT_AMBULATORY_CARE_PROVIDER_SITE_OTHER): Payer: Self-pay | Admitting: General Surgery

## 2014-03-21 VITALS — BP 140/80 | HR 80 | Temp 97.8°F | Resp 14 | Ht 62.0 in | Wt 140.4 lb

## 2014-03-21 DIAGNOSIS — C8589 Other specified types of non-Hodgkin lymphoma, extranodal and solid organ sites: Secondary | ICD-10-CM

## 2014-03-21 DIAGNOSIS — C8590 Non-Hodgkin lymphoma, unspecified, unspecified site: Secondary | ICD-10-CM

## 2014-03-21 DIAGNOSIS — C50919 Malignant neoplasm of unspecified site of unspecified female breast: Secondary | ICD-10-CM

## 2014-03-21 NOTE — Progress Notes (Signed)
Subjective:   new right axillary lymphadenopathy  Patient ID: Helen [name], female   DOB: 1935-02-02, 79 y.o.   MRN: [id number]  HPI Patient well known to me due to a history of left breast carcinoma originally presenting as a fungating mass and was diagnosed with metastatic breast cancer in October 2009. Patient underwent neoadjuvant chemotherapy followed by mastectomy. She is currently on Arimidex 1mg  daily and monthly Xgeva.   In 2012, she presented with right axillary lymphadenopathy and biopsy confirmed CLL/non-Hodgkin lymphoma with bone marrow involvement. She received 6 cycles of CVP and Rituxan, followed by maintenance Rituxan. Later, her disease recurred and underwent 4 cycles of R-Bendamustine. PET/CT showed CR and then she proceeded to maintenance Rituxan.     Recent followup CT scan was performed showing right axillary adenopathy is noted below. Concern has been raised about transitioned to high-grade lymphoma and biopsy has been requested.   Review of Systems Gen.: Denies fevers chills or night sweats Respiratory: Denies shortness of breath cough wheezing Cardiac: Denies chest pain palpitations or swelling Abdomen: No abdominal pain or vomiting Breast: No right breast mass or change in her left chest wall    Objective:   Physical Exam BP 140/80  Pulse 80  Temp(Src) 97.8 F (36.6 C) (Temporal)  Resp 14  Ht 5\' 2"  (1.575 m)  Wt 140 lb 6.4 oz (63.685 kg)  BMI 25.67 kg/m2 General: A thin elderly Afro-American female no distress HEENT: No palpable masses or thyromegaly. Sclera nonicteric Lymph nodes: No cervical or subclavicular adenopathy. In the right axilla somewhat high and medially has an approximately 2-2-1/2 cm enlarged palpable soft lymph node. Breasts: No right breast masses. Status post left mastectomy with negative chest wall. Left axilla negative Lungs: Clear to breath sounds without increased work of breathing Cardiac: Regular rate and rhythm no  murmurs Extremities: No edema or deformity Neurologic: Alert and fully oriented. Gait normal.    JO:INOMVEHMCN:  Right axillary and right juxta diaphragmatic lymphadenopathy,  new/increased, suggesting lymphomatous recurrence.     Assessment:     New onset of right axillary adenopathy in the setting of ongoing treatment for CLL as above. Right axillary lymph node biopsy has been requested to rule out transformation to high-grade lymphoma    Plan:     Excisional biopsy right axillary lymph node under general anesthesia as soon as possible. I discussed the procedure and indications with the patient and her family including risks of general anesthesia, bleeding, infection and seroma. They understand that this is a diagnostic step only. All her questions were answered.

## 2014-03-22 ENCOUNTER — Telehealth: Payer: Self-pay | Admitting: *Deleted

## 2014-03-22 NOTE — Telephone Encounter (Signed)
Informed pt of need to r/s her appt on 5/27 to 6/9 at 12 pm to give time for Dr. Alvy Bimler to have biopsy results.  Pt agreed and understands.  States she is scheduled for surgery on 6/01.  She will see Dr. Alvy Bimler on 6/9.  POF sent to r/s.

## 2014-03-23 ENCOUNTER — Telehealth: Payer: Self-pay | Admitting: Hematology and Oncology

## 2014-03-23 NOTE — Telephone Encounter (Signed)
s.w. pt and advised on May appt moved to June....pt ok and aware

## 2014-03-28 ENCOUNTER — Ambulatory Visit: Payer: Self-pay

## 2014-03-28 ENCOUNTER — Other Ambulatory Visit: Payer: Self-pay

## 2014-03-29 ENCOUNTER — Ambulatory Visit: Payer: Self-pay | Admitting: Hematology and Oncology

## 2014-04-03 ENCOUNTER — Other Ambulatory Visit (INDEPENDENT_AMBULATORY_CARE_PROVIDER_SITE_OTHER): Payer: Self-pay | Admitting: General Surgery

## 2014-04-03 ENCOUNTER — Ambulatory Visit: Payer: Self-pay | Admitting: Hematology and Oncology

## 2014-04-03 DIAGNOSIS — C8584 Other specified types of non-Hodgkin lymphoma, lymph nodes of axilla and upper limb: Secondary | ICD-10-CM

## 2014-04-04 ENCOUNTER — Other Ambulatory Visit (INDEPENDENT_AMBULATORY_CARE_PROVIDER_SITE_OTHER): Payer: Self-pay

## 2014-04-04 MED ORDER — HYDROCODONE-ACETAMINOPHEN 5-325 MG PO TABS: 1.0000 | ORAL_TABLET | Freq: Four times a day (QID) | ORAL | Status: DC | PRN

## 2014-04-05 ENCOUNTER — Telehealth (INDEPENDENT_AMBULATORY_CARE_PROVIDER_SITE_OTHER): Payer: Self-pay | Admitting: General Surgery

## 2014-04-05 NOTE — Telephone Encounter (Signed)
Called pt and discussed path report 

## 2014-04-11 ENCOUNTER — Telehealth: Payer: Self-pay | Admitting: Hematology and Oncology

## 2014-04-11 ENCOUNTER — Encounter: Payer: Self-pay | Admitting: Hematology and Oncology

## 2014-04-11 ENCOUNTER — Encounter: Payer: Self-pay | Admitting: *Deleted

## 2014-04-11 ENCOUNTER — Ambulatory Visit (HOSPITAL_BASED_OUTPATIENT_CLINIC_OR_DEPARTMENT_OTHER): Payer: Medicare Other | Admitting: Hematology and Oncology

## 2014-04-11 ENCOUNTER — Telehealth: Payer: Self-pay | Admitting: *Deleted

## 2014-04-11 ENCOUNTER — Ambulatory Visit (HOSPITAL_BASED_OUTPATIENT_CLINIC_OR_DEPARTMENT_OTHER): Payer: Medicare Other

## 2014-04-11 VITALS — BP 128/52 | HR 62 | Temp 98.1°F | Resp 20 | Ht 62.0 in | Wt 138.9 lb

## 2014-04-11 DIAGNOSIS — M1710 Unilateral primary osteoarthritis, unspecified knee: Secondary | ICD-10-CM

## 2014-04-11 DIAGNOSIS — C7951 Secondary malignant neoplasm of bone: Secondary | ICD-10-CM

## 2014-04-11 DIAGNOSIS — C7952 Secondary malignant neoplasm of bone marrow: Secondary | ICD-10-CM

## 2014-04-11 DIAGNOSIS — C8590 Non-Hodgkin lymphoma, unspecified, unspecified site: Secondary | ICD-10-CM

## 2014-04-11 DIAGNOSIS — IMO0002 Reserved for concepts with insufficient information to code with codable children: Secondary | ICD-10-CM

## 2014-04-11 DIAGNOSIS — M25559 Pain in unspecified hip: Secondary | ICD-10-CM

## 2014-04-11 DIAGNOSIS — C50919 Malignant neoplasm of unspecified site of unspecified female breast: Secondary | ICD-10-CM

## 2014-04-11 DIAGNOSIS — C8584 Other specified types of non-Hodgkin lymphoma, lymph nodes of axilla and upper limb: Secondary | ICD-10-CM

## 2014-04-11 DIAGNOSIS — M159 Polyosteoarthritis, unspecified: Secondary | ICD-10-CM

## 2014-04-11 LAB — CBC WITH DIFFERENTIAL/PLATELET
BASO%: 1 % (ref 0.0–2.0)
BASOS ABS: 0.1 10*3/uL (ref 0.0–0.1)
EOS ABS: 0.3 10*3/uL (ref 0.0–0.5)
EOS%: 3.9 % (ref 0.0–7.0)
HCT: 34.6 % — ABNORMAL LOW (ref 34.8–46.6)
HEMOGLOBIN: 11 g/dL — AB (ref 11.6–15.9)
LYMPH#: 0.4 10*3/uL — AB (ref 0.9–3.3)
LYMPH%: 5.9 % — ABNORMAL LOW (ref 14.0–49.7)
MCH: 27.5 pg (ref 25.1–34.0)
MCHC: 31.8 g/dL (ref 31.5–36.0)
MCV: 86.7 fL (ref 79.5–101.0)
MONO#: 0.9 10*3/uL (ref 0.1–0.9)
MONO%: 14 % (ref 0.0–14.0)
NEUT#: 5 10*3/uL (ref 1.5–6.5)
NEUT%: 75.2 % (ref 38.4–76.8)
Platelets: 289 10*3/uL (ref 145–400)
RBC: 3.99 10*6/uL (ref 3.70–5.45)
RDW: 14.7 % — ABNORMAL HIGH (ref 11.2–14.5)
WBC: 6.6 10*3/uL (ref 3.9–10.3)

## 2014-04-11 LAB — COMPREHENSIVE METABOLIC PANEL (CC13)
ALBUMIN: 3.6 g/dL (ref 3.5–5.0)
ALT: 16 U/L (ref 0–55)
ANION GAP: 9 meq/L (ref 3–11)
AST: 21 U/L (ref 5–34)
Alkaline Phosphatase: 91 U/L (ref 40–150)
BUN: 10.3 mg/dL (ref 7.0–26.0)
CHLORIDE: 105 meq/L (ref 98–109)
CO2: 27 meq/L (ref 22–29)
CREATININE: 0.8 mg/dL (ref 0.6–1.1)
Calcium: 9.7 mg/dL (ref 8.4–10.4)
GLUCOSE: 88 mg/dL (ref 70–140)
Potassium: 4 mEq/L (ref 3.5–5.1)
Sodium: 141 mEq/L (ref 136–145)
Total Bilirubin: 0.22 mg/dL (ref 0.20–1.20)
Total Protein: 6.9 g/dL (ref 6.4–8.3)

## 2014-04-11 LAB — URIC ACID (CC13): URIC ACID, SERUM: 5.4 mg/dL (ref 2.6–7.4)

## 2014-04-11 LAB — LACTATE DEHYDROGENASE (CC13): LDH: 354 U/L — AB (ref 125–245)

## 2014-04-11 MED ORDER — ALLOPURINOL 300 MG PO TABS: 300.0000 mg | ORAL_TABLET | Freq: Every day | ORAL | Status: DC

## 2014-04-11 MED ORDER — ANASTROZOLE 1 MG PO TABS: 1.0000 mg | ORAL_TABLET | Freq: Every day | ORAL | Status: DC

## 2014-04-11 MED ORDER — HYDROCODONE-ACETAMINOPHEN 5-325 MG PO TABS: 1.0000 | ORAL_TABLET | Freq: Four times a day (QID) | ORAL | Status: DC | PRN

## 2014-04-11 MED ORDER — CHLORAMBUCIL 2 MG PO TABS: ORAL_TABLET | ORAL | Status: DC

## 2014-04-11 MED ORDER — ACYCLOVIR 400 MG PO TABS: 400.0000 mg | ORAL_TABLET | Freq: Every day | ORAL | Status: DC

## 2014-04-11 NOTE — Telephone Encounter (Signed)
Per staff phone call and POF I have schedueld appts.  JMW  

## 2014-04-11 NOTE — Progress Notes (Signed)
Faxed leukeran prescription to [facility]

## 2014-04-11 NOTE — Progress Notes (Signed)
[facility] OFFICE PROGRESS NOTE SUMMARY OF ONCOLOGIC HISTORY:  #1 left breast carcinoma originally presenting as a fungating mass and was diagnosed with metastatic breast cancer in October 2009. Patient underwent neoadjuvant chemotherapy followed by mastectomy. She is currently on Arimidex 1mg  daily and monthly Xgeva. 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 She received 6 cycles of CVP and Rituxan, followed by maintenance Rituxan. Later, her disease recurred and underwent 4 cycles of R-Bendamustine. PET/CT showed CR and then she proceeded to maintenance Rituxan.  #3 In April 2015, repeat CT scan show new lymphadenopathy. #4 on 04/03/2014, axillary lymph node dissection showed recurrence of CLL/SLL INTERVAL HISTORY: Please see below for problem oriented charting. She has some mild pain at the surgical site and back pain.  REVIEW OF SYSTEMS:   Constitutional: Denies fevers, chills or abnormal weight loss Eyes: Denies blurriness of vision Ears, nose, mouth, throat, and face: Denies mucositis or sore throat Respiratory: Denies cough, dyspnea or wheezes Cardiovascular: Denies palpitation, chest discomfort or lower extremity swelling Gastrointestinal:  Denies nausea, heartburn or change in bowel habits Skin: Denies abnormal skin rashes Lymphatics: Denies new lymphadenopathy or easy bruising Neurological:Denies numbness, tingling or new weaknesses Behavioral/Psych: Mood is stable, no new changes  All other systems were reviewed with the patient and are negative.  I have reviewed the past medical history, past surgical history, social history and family history with the patient and they are unchanged from previous note.  ALLERGIES:  is allergic to codeine; penicillins; sulfonamide derivatives; tape; and tramadol hcl.  MEDICATIONS:  Current Outpatient Prescriptions  Medication Sig  Dispense Refill  . anastrozole (ARIMIDEX) 1 MG tablet Take 1 tablet (1 mg total) by mouth daily.  30 tablet  5  . calcium-vitamin D (OSCAL WITH D) 500-200 MG-UNIT per tablet Take 1 tablet by mouth daily.       [name] gatifloxacin (ZYMAXID) 0.5 % SOLN Place 1 drop into both eyes 4 (four) times daily.      [name] HYDROcodone-acetaminophen (NORCO) 5-325 MG per tablet Take 1 tablet by mouth every 6 (six) hours as needed for moderate pain.  60 tablet  0  . acyclovir (ZOVIRAX) 400 MG tablet Take 1 tablet (400 mg total) by mouth daily.  30 tablet  60  . allopurinol (ZYLOPRIM) 300 MG tablet Take 1 tablet (300 mg total) by mouth daily.  30 tablet  1  . chlorambucil (LEUKERAN) 2 MG tablet Give on an empty stomach 1 hour before or 2 hours after meals. Take 2 tablets on the day of chemo treatment every other week  20 tablet  0  . prednisoLONE acetate (PRED FORTE) 1 % ophthalmic suspension Place 1 drop into both eyes 4 (four) times daily.       No current facility-administered medications for this visit.    PHYSICAL EXAMINATION: ECOG PERFORMANCE STATUS: 1 - Symptomatic but completely ambulatory  Filed Vitals:   04/11/14 1156  BP: 128/52  Pulse: 62  Temp: 98.1 F (36.7 C)  Resp: 20   Filed Weights   04/11/14 1156  Weight: 138 lb 14.4 oz (63.005 kg)    GENERAL:alert, no distress and comfortable SKIN: skin color, texture, turgor are normal, no rashes or significant lesions EYES: normal, Conjunctiva are pink and non-injected, sclera clear OROPHARYNX:no exudate, no erythema and lips, buccal mucosa, and tongue normal  ABDOMEN:abdomen soft, non-tender and normal bowel sounds Musculoskeletal:no cyanosis of digits and no clubbing . Well-healed surgical scar  in her right axilla. NEURO: alert & oriented x 3 with fluent speech, no focal motor/sensory deficits  LABORATORY DATA:  I have reviewed the data as listed    Component Value Date/Time   NA 141 04/11/2014 1303   NA 136 07/06/2012 1122   NA 142 06/01/2012  1008   K 4.0 04/11/2014 1303   K 3.8 07/06/2012 1122   K 3.8 06/01/2012 1008   CL 107 04/22/2013 0922   CL 102 07/06/2012 1122   CL 103 06/01/2012 1008   CO2 27 04/11/2014 1303   CO2 20 07/06/2012 1122   CO2 25 06/01/2012 1008   GLUCOSE 88 04/11/2014 1303   GLUCOSE 89 04/22/2013 0922   GLUCOSE 182* 07/06/2012 1122   GLUCOSE 108 06/01/2012 1008   BUN 10.3 04/11/2014 1303   BUN 8 07/06/2012 1122   BUN 9 06/01/2012 1008   CREATININE 0.8 04/11/2014 1303   CREATININE 0.60 07/06/2012 1122   CREATININE 0.9 06/01/2012 1008   CALCIUM 9.7 04/11/2014 1303   CALCIUM 9.1 07/06/2012 1122   CALCIUM 9.8 06/01/2012 1008   PROT 6.9 04/11/2014 1303   PROT 7.7 02/10/2012 1035   PROT 8.1 08/14/2010 1251   ALBUMIN 3.6 04/11/2014 1303   ALBUMIN 4.3 02/10/2012 1035   AST 21 04/11/2014 1303   AST 20 02/10/2012 1035   AST 19 08/14/2010 1251   ALT 16 04/11/2014 1303   ALT 19 02/10/2012 1035   ALT 9* 08/14/2010 1251   ALKPHOS 91 04/11/2014 1303   ALKPHOS 69 02/10/2012 1035   ALKPHOS 78 08/14/2010 1251   BILITOT 0.22 04/11/2014 1303   BILITOT 0.4 02/10/2012 1035   BILITOT 0.40 08/14/2010 1251   GFRNONAA 87* 07/06/2012 1122   GFRAA >90 07/06/2012 1122    No results found for this basename: SPEP,  UPEP,   kappa and lambda light chains    Lab Results  Component Value Date   WBC 6.6 04/11/2014   NEUTROABS 5.0 04/11/2014   HGB 11.0* 04/11/2014   HCT 34.6* 04/11/2014   MCV 86.7 04/11/2014   PLT 289 04/11/2014      Chemistry      Component Value Date/Time   NA 141 04/11/2014 1303   NA 136 07/06/2012 1122   NA 142 06/01/2012 1008   K 4.0 04/11/2014 1303   K 3.8 07/06/2012 1122   K 3.8 06/01/2012 1008   CL 107 04/22/2013 0922   CL 102 07/06/2012 1122   CL 103 06/01/2012 1008   CO2 27 04/11/2014 1303   CO2 20 07/06/2012 1122   CO2 25 06/01/2012 1008   BUN 10.3 04/11/2014 1303   BUN 8 07/06/2012 1122   BUN 9 06/01/2012 1008   CREATININE 0.8 04/11/2014 1303   CREATININE 0.60 07/06/2012 1122   CREATININE 0.9 06/01/2012 1008      Component Value Date/Time   CALCIUM 9.7 04/11/2014  1303   CALCIUM 9.1 07/06/2012 1122   CALCIUM 9.8 06/01/2012 1008   ALKPHOS 91 04/11/2014 1303   ALKPHOS 69 02/10/2012 1035   ALKPHOS 78 08/14/2010 1251   AST 21 04/11/2014 1303   AST 20 02/10/2012 1035   AST 19 08/14/2010 1251   ALT 16 04/11/2014 1303   ALT 19 02/10/2012 1035   ALT 9* 08/14/2010 1251   BILITOT 0.22 04/11/2014 1303   BILITOT 0.4 02/10/2012 1035   BILITOT 0.40 08/14/2010 1251     ASSESSMENT & PLAN:  Breast CA She has no evidence of active metastatic disease. She will continue on  Arimidex indefinitely.  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 We discussed the role of chemotherapy. The intent is for palliative.  We discussed some of the risks, benefits and side-effects of Obinutuzumab and Chlorambucil.   Some of the short term side-effects included, though not limited to, risk of fatigue, weight loss, tumor lysis syndrome, risk of allergic reactions, pancytopenia, life-threatening infections, need for transfusions of blood products, nausea, vomiting, change in bowel habits, hair loss, risk of congestive heart failure, admission to hospital for various reasons, and risks of death.   Long term side-effects are also discussed including permanent damage to nerve function, chronic fatigue, and rare secondary malignancy including bone marrow disorders.   The patient is aware that the response rates discussed earlier is not guaranteed.   Patient education material was dispensed I will proceed to prescribe chlorambucil but plan to give with dose adjustment due to her age at 4 mg dose with each treatment. I was recommended allopurinol for tumor lysis prophylaxis and acyclovir as antimicrobial prophylaxis.  DEGENERATIVE JOINT DISEASE, BOTH KNEES, SEVERE I gave her a prescription of pain medicine and recommend she follows with primary care provider for chronic joint pain management.    Orders Placed This Encounter  Procedures  . CBC with  Differential    Standing Status: Standing     Number of Occurrences: 22     Standing Expiration Date: 04/12/2015  . Comprehensive metabolic panel    Standing Status: Standing     Number of Occurrences: 22     Standing Expiration Date: 04/12/2015  . Lactate dehydrogenase    Standing Status: Standing     Number of Occurrences: 22     Standing Expiration Date: 04/12/2015  . Uric Acid    Standing Status: Standing     Number of Occurrences: 22     Standing Expiration Date: 04/12/2015  . Ambulatory referral to Internal Medicine    Referral Priority:  Routine    Referral Type:  Consultation    Referral Reason:  Specialty Services Required    Requested Specialty:  Internal Medicine    Number of Visits Requested:  1   All questions were answered. The patient knows to call the clinic with any problems, questions or concerns. No barriers to learning was detected.   [name], MD 04/11/2014 10:31 PM

## 2014-04-11 NOTE — Progress Notes (Signed)
New Rx for Leukeran (chlorambucil) given to Helen [name] in managed care dept for pre cert/prior auth.

## 2014-04-11 NOTE — Telephone Encounter (Signed)
gv adn printed appt sched and avs for pt for June and July.....sed/MW added tx....gv pt # for [employer]

## 2014-04-11 NOTE — Assessment & Plan Note (Signed)
She has no evidence of active metastatic disease. She will continue on Arimidex indefinitely.

## 2014-04-11 NOTE — Assessment & Plan Note (Signed)
I gave her a prescription of pain medicine and recommend she follows with primary care provider for chronic joint pain management.

## 2014-04-11 NOTE — Telephone Encounter (Signed)
gv and printed appt sched and avs for pt for June and July....sed/mw added tx...s.ent pt to lab

## 2014-04-11 NOTE — Assessment & Plan Note (Addendum)
She has recurrence of a small lymphocytic lymphoma/CLL. We discussed the role of chemotherapy. The intent is for palliative.  We discussed some of the risks, benefits and side-effects of Obinutuzumab and Chlorambucil.   Some of the short term side-effects included, though not limited to, risk of fatigue, weight loss, tumor lysis syndrome, risk of allergic reactions, pancytopenia, life-threatening infections, need for transfusions of blood products, nausea, vomiting, change in bowel habits, hair loss, risk of congestive heart failure, admission to hospital for various reasons, and risks of death.   Long term side-effects are also discussed including permanent damage to nerve function, chronic fatigue, and rare secondary malignancy including bone marrow disorders.   The patient is aware that the response rates discussed earlier is not guaranteed.   Patient education material was dispensed I will proceed to prescribe chlorambucil but plan to give with dose adjustment due to her age at 4 mg dose with each treatment. I was recommended allopurinol for tumor lysis prophylaxis and acyclovir as antimicrobial prophylaxis.

## 2014-04-12 ENCOUNTER — Telehealth: Payer: Self-pay | Admitting: *Deleted

## 2014-04-12 NOTE — Telephone Encounter (Signed)
S/w [facility] pharmacist and clarified that refills on Acyclovir are #3 not #60, per Dr. Alvy Bimler.  Was entered incorrectly.  They will correct the rx.

## 2014-04-18 ENCOUNTER — Other Ambulatory Visit: Payer: Self-pay

## 2014-04-18 ENCOUNTER — Ambulatory Visit (HOSPITAL_BASED_OUTPATIENT_CLINIC_OR_DEPARTMENT_OTHER): Payer: Medicare Other

## 2014-04-18 ENCOUNTER — Ambulatory Visit: Payer: Self-pay

## 2014-04-18 VITALS — BP 141/57 | HR 69 | Temp 97.7°F | Resp 18

## 2014-04-18 DIAGNOSIS — C8584 Other specified types of non-Hodgkin lymphoma, lymph nodes of axilla and upper limb: Secondary | ICD-10-CM

## 2014-04-18 DIAGNOSIS — Z5112 Encounter for antineoplastic immunotherapy: Secondary | ICD-10-CM

## 2014-04-18 DIAGNOSIS — C8590 Non-Hodgkin lymphoma, unspecified, unspecified site: Secondary | ICD-10-CM

## 2014-04-18 MED ORDER — SODIUM CHLORIDE 0.9 % IV SOLN
100.0000 mg | Freq: Once | INTRAVENOUS | Status: AC
  Administered 2014-04-18: 100 mg via INTRAVENOUS
  Filled 2014-04-18: qty 4

## 2014-04-18 MED ORDER — ACETAMINOPHEN 325 MG PO TABS
ORAL_TABLET | ORAL | Status: AC
  Filled 2014-04-18: qty 2

## 2014-04-18 MED ORDER — SODIUM CHLORIDE 0.9 % IV SOLN
Freq: Once | INTRAVENOUS | Status: AC
  Administered 2014-04-18: 11:00:00 via INTRAVENOUS

## 2014-04-18 MED ORDER — DEXAMETHASONE SODIUM PHOSPHATE 20 MG/5ML IJ SOLN
INTRAMUSCULAR | Status: AC
  Filled 2014-04-18: qty 5

## 2014-04-18 MED ORDER — ACETAMINOPHEN 325 MG PO TABS
650.0000 mg | ORAL_TABLET | Freq: Once | ORAL | Status: AC
  Administered 2014-04-18: 650 mg via ORAL

## 2014-04-18 MED ORDER — DIPHENHYDRAMINE HCL 50 MG/ML IJ SOLN
INTRAMUSCULAR | Status: AC
  Filled 2014-04-18: qty 1

## 2014-04-18 MED ORDER — HEPARIN SOD (PORK) LOCK FLUSH 100 UNIT/ML IV SOLN
500.0000 [IU] | Freq: Once | INTRAVENOUS | Status: AC | PRN
  Administered 2014-04-18: 500 [IU]
  Filled 2014-04-18: qty 5

## 2014-04-18 MED ORDER — DEXAMETHASONE SODIUM PHOSPHATE 20 MG/5ML IJ SOLN
20.0000 mg | Freq: Once | INTRAMUSCULAR | Status: AC
  Administered 2014-04-18: 20 mg via INTRAVENOUS

## 2014-04-18 MED ORDER — DIPHENHYDRAMINE HCL 50 MG/ML IJ SOLN
50.0000 mg | Freq: Once | INTRAMUSCULAR | Status: AC
  Administered 2014-04-18: 50 mg via INTRAVENOUS

## 2014-04-18 MED ORDER — SODIUM CHLORIDE 0.9 % IJ SOLN
10.0000 mL | INTRAMUSCULAR | Status: DC | PRN
  Administered 2014-04-18: 10 mL
  Filled 2014-04-18: qty 10

## 2014-04-18 NOTE — Patient Instructions (Signed)
BRING ALL PILL BOTTLES TO TREATMENT TOMORROW!!!!   [facility] Discharge Instructions for Patients Receiving Chemotherapy  Today you received the following chemotherapy agents: Gazyva  To help prevent nausea and vomiting after your treatment, we encourage you to take your nausea medication as prescribed.    If you develop nausea and vomiting that is not controlled by your nausea medication, call the clinic.   BELOW ARE SYMPTOMS THAT SHOULD BE REPORTED IMMEDIATELY:  *FEVER GREATER THAN 100.5 F  *CHILLS WITH OR WITHOUT FEVER  NAUSEA AND VOMITING THAT IS NOT CONTROLLED WITH YOUR NAUSEA MEDICATION  *UNUSUAL SHORTNESS OF BREATH  *UNUSUAL BRUISING OR BLEEDING  TENDERNESS IN MOUTH AND THROAT WITH OR WITHOUT PRESENCE OF ULCERS  *URINARY PROBLEMS  *BOWEL PROBLEMS  UNUSUAL RASH Items with * indicate a potential emergency and should be followed up as soon as possible.  Feel free to call the clinic you have any questions or concerns. The clinic phone number is [phone].

## 2014-04-19 ENCOUNTER — Ambulatory Visit (HOSPITAL_BASED_OUTPATIENT_CLINIC_OR_DEPARTMENT_OTHER): Payer: Medicare Other

## 2014-04-19 ENCOUNTER — Telehealth: Payer: Self-pay | Admitting: *Deleted

## 2014-04-19 VITALS — BP 166/74 | HR 58 | Temp 97.7°F | Resp 18

## 2014-04-19 DIAGNOSIS — Z5112 Encounter for antineoplastic immunotherapy: Secondary | ICD-10-CM

## 2014-04-19 DIAGNOSIS — C8590 Non-Hodgkin lymphoma, unspecified, unspecified site: Secondary | ICD-10-CM

## 2014-04-19 DIAGNOSIS — C8584 Other specified types of non-Hodgkin lymphoma, lymph nodes of axilla and upper limb: Secondary | ICD-10-CM

## 2014-04-19 MED ORDER — SODIUM CHLORIDE 0.9 % IV SOLN
Freq: Once | INTRAVENOUS | Status: AC
  Administered 2014-04-19: 12:00:00 via INTRAVENOUS

## 2014-04-19 MED ORDER — DEXAMETHASONE SODIUM PHOSPHATE 20 MG/5ML IJ SOLN
INTRAMUSCULAR | Status: AC
  Filled 2014-04-19: qty 5

## 2014-04-19 MED ORDER — SODIUM CHLORIDE 0.9 % IV SOLN
900.0000 mg | Freq: Once | INTRAVENOUS | Status: AC
  Administered 2014-04-19: 900 mg via INTRAVENOUS
  Filled 2014-04-19: qty 36

## 2014-04-19 MED ORDER — HEPARIN SOD (PORK) LOCK FLUSH 100 UNIT/ML IV SOLN
500.0000 [IU] | Freq: Once | INTRAVENOUS | Status: AC | PRN
  Administered 2014-04-19: 500 [IU]
  Filled 2014-04-19: qty 5

## 2014-04-19 MED ORDER — SODIUM CHLORIDE 0.9 % IJ SOLN
10.0000 mL | INTRAMUSCULAR | Status: DC | PRN
  Administered 2014-04-19: 10 mL
  Filled 2014-04-19: qty 10

## 2014-04-19 MED ORDER — ACETAMINOPHEN 325 MG PO TABS
ORAL_TABLET | ORAL | Status: AC
  Filled 2014-04-19: qty 2

## 2014-04-19 MED ORDER — DEXAMETHASONE SODIUM PHOSPHATE 20 MG/5ML IJ SOLN
20.0000 mg | Freq: Once | INTRAMUSCULAR | Status: AC
  Administered 2014-04-19: 20 mg via INTRAVENOUS

## 2014-04-19 MED ORDER — DIPHENHYDRAMINE HCL 50 MG/ML IJ SOLN
50.0000 mg | Freq: Once | INTRAMUSCULAR | Status: AC
  Administered 2014-04-19: 50 mg via INTRAVENOUS

## 2014-04-19 MED ORDER — DIPHENHYDRAMINE HCL 50 MG/ML IJ SOLN
INTRAMUSCULAR | Status: AC
  Filled 2014-04-19: qty 1

## 2014-04-19 MED ORDER — ACETAMINOPHEN 325 MG PO TABS
650.0000 mg | ORAL_TABLET | Freq: Once | ORAL | Status: AC
  Administered 2014-04-19: 650 mg via ORAL

## 2014-04-19 NOTE — Patient Instructions (Addendum)
[facility] Discharge Instructions for Patients Receiving Chemotherapy  Today you received the following chemotherapy agents gazyva.  To help prevent nausea and vomiting after your treatment, we encourage you to take your nausea medication as directed.    If you develop nausea and vomiting that is not controlled by your nausea medication, call the clinic.   BELOW ARE SYMPTOMS THAT SHOULD BE REPORTED IMMEDIATELY:  *FEVER GREATER THAN 100.5 F  *CHILLS WITH OR WITHOUT FEVER  NAUSEA AND VOMITING THAT IS NOT CONTROLLED WITH YOUR NAUSEA MEDICATION  *UNUSUAL SHORTNESS OF BREATH  *UNUSUAL BRUISING OR BLEEDING  TENDERNESS IN MOUTH AND THROAT WITH OR WITHOUT PRESENCE OF ULCERS  *URINARY PROBLEMS  *BOWEL PROBLEMS  UNUSUAL RASH Items with * indicate a potential emergency and should be followed up as soon as possible.  Feel free to call the clinic you have any questions or concerns. The clinic phone number is [phone].  BRING LEUKERAN WITH YOU TO EVERY CHEMOTHERAPY APPOINTMENT. DO NOT TAKE AT HOME!!!!

## 2014-04-19 NOTE — Telephone Encounter (Signed)
S/w pharmacy tech at [facility] outpatient pharmacy.  They have pt's Leukeran ready for pick up.  Her co pay is $1.20.  Asked Infusion RN, Beverlee Nims to inform pt of Rx ready to pick up.  Instruct pt to bring med w/ her on next chemo appt on 6/23.  Pt is to bring medication to each treatment and take once every other week.   Beverlee Nims states will instruct pt on picking up medication and bringing w/ her to next chemo tx.

## 2014-04-25 ENCOUNTER — Other Ambulatory Visit: Payer: Self-pay | Admitting: Hematology and Oncology

## 2014-04-25 ENCOUNTER — Ambulatory Visit (HOSPITAL_BASED_OUTPATIENT_CLINIC_OR_DEPARTMENT_OTHER): Payer: Medicare Other

## 2014-04-25 ENCOUNTER — Other Ambulatory Visit: Payer: Self-pay | Admitting: *Deleted

## 2014-04-25 ENCOUNTER — Other Ambulatory Visit (HOSPITAL_BASED_OUTPATIENT_CLINIC_OR_DEPARTMENT_OTHER): Payer: Medicare Other

## 2014-04-25 VITALS — BP 123/62 | HR 61 | Temp 97.8°F | Resp 16

## 2014-04-25 DIAGNOSIS — C8590 Non-Hodgkin lymphoma, unspecified, unspecified site: Secondary | ICD-10-CM

## 2014-04-25 DIAGNOSIS — C8584 Other specified types of non-Hodgkin lymphoma, lymph nodes of axilla and upper limb: Secondary | ICD-10-CM

## 2014-04-25 DIAGNOSIS — Z5112 Encounter for antineoplastic immunotherapy: Secondary | ICD-10-CM

## 2014-04-25 DIAGNOSIS — C8300 Small cell B-cell lymphoma, unspecified site: Secondary | ICD-10-CM

## 2014-04-25 LAB — COMPREHENSIVE METABOLIC PANEL (CC13)
ALK PHOS: 95 U/L (ref 40–150)
ALT: 12 U/L (ref 0–55)
AST: 13 U/L (ref 5–34)
Albumin: 3.5 g/dL (ref 3.5–5.0)
Anion Gap: 10 mEq/L (ref 3–11)
BUN: 7.7 mg/dL (ref 7.0–26.0)
CO2: 25 mEq/L (ref 22–29)
CREATININE: 0.8 mg/dL (ref 0.6–1.1)
Calcium: 9.9 mg/dL (ref 8.4–10.4)
Chloride: 107 mEq/L (ref 98–109)
Glucose: 112 mg/dl (ref 70–140)
POTASSIUM: 3.4 meq/L — AB (ref 3.5–5.1)
Sodium: 142 mEq/L (ref 136–145)
Total Bilirubin: 0.2 mg/dL (ref 0.20–1.20)
Total Protein: 6.6 g/dL (ref 6.4–8.3)

## 2014-04-25 LAB — CBC WITH DIFFERENTIAL/PLATELET
BASO%: 0.6 % (ref 0.0–2.0)
BASOS ABS: 0 10*3/uL (ref 0.0–0.1)
EOS%: 9 % — ABNORMAL HIGH (ref 0.0–7.0)
Eosinophils Absolute: 0.4 10*3/uL (ref 0.0–0.5)
HCT: 33.5 % — ABNORMAL LOW (ref 34.8–46.6)
HEMOGLOBIN: 10.8 g/dL — AB (ref 11.6–15.9)
LYMPH%: 6.7 % — ABNORMAL LOW (ref 14.0–49.7)
MCH: 27.6 pg (ref 25.1–34.0)
MCHC: 32.2 g/dL (ref 31.5–36.0)
MCV: 85.8 fL (ref 79.5–101.0)
MONO#: 0.6 10*3/uL (ref 0.1–0.9)
MONO%: 13.5 % (ref 0.0–14.0)
NEUT#: 3 10*3/uL (ref 1.5–6.5)
NEUT%: 70.2 % (ref 38.4–76.8)
Platelets: 261 10*3/uL (ref 145–400)
RBC: 3.91 10*6/uL (ref 3.70–5.45)
RDW: 15.3 % — AB (ref 11.2–14.5)
WBC: 4.3 10*3/uL (ref 3.9–10.3)
lymph#: 0.3 10*3/uL — ABNORMAL LOW (ref 0.9–3.3)

## 2014-04-25 LAB — LACTATE DEHYDROGENASE (CC13): LDH: 211 U/L (ref 125–245)

## 2014-04-25 LAB — URIC ACID (CC13): Uric Acid, Serum: 3.3 mg/dl (ref 2.6–7.4)

## 2014-04-25 MED ORDER — SODIUM CHLORIDE 0.9 % IV SOLN
Freq: Once | INTRAVENOUS | Status: AC
  Administered 2014-04-25: 09:00:00 via INTRAVENOUS

## 2014-04-25 MED ORDER — DEXAMETHASONE SODIUM PHOSPHATE 20 MG/5ML IJ SOLN
INTRAMUSCULAR | Status: AC
  Filled 2014-04-25: qty 5

## 2014-04-25 MED ORDER — SODIUM CHLORIDE 0.9 % IV SOLN
1000.0000 mg | Freq: Once | INTRAVENOUS | Status: AC
  Administered 2014-04-25: 1000 mg via INTRAVENOUS
  Filled 2014-04-25: qty 40

## 2014-04-25 MED ORDER — ACETAMINOPHEN 325 MG PO TABS
ORAL_TABLET | ORAL | Status: AC
  Filled 2014-04-25: qty 2

## 2014-04-25 MED ORDER — SODIUM CHLORIDE 0.9 % IJ SOLN
10.0000 mL | INTRAMUSCULAR | Status: DC | PRN
  Administered 2014-04-25: 10 mL
  Filled 2014-04-25: qty 10

## 2014-04-25 MED ORDER — DEXAMETHASONE SODIUM PHOSPHATE 20 MG/5ML IJ SOLN
20.0000 mg | Freq: Once | INTRAMUSCULAR | Status: AC
  Administered 2014-04-25: 20 mg via INTRAVENOUS

## 2014-04-25 MED ORDER — DIPHENHYDRAMINE HCL 50 MG/ML IJ SOLN
INTRAMUSCULAR | Status: AC
  Filled 2014-04-25: qty 1

## 2014-04-25 MED ORDER — DIPHENHYDRAMINE HCL 50 MG/ML IJ SOLN
50.0000 mg | Freq: Once | INTRAMUSCULAR | Status: AC
  Administered 2014-04-25: 50 mg via INTRAVENOUS

## 2014-04-25 MED ORDER — HEPARIN SOD (PORK) LOCK FLUSH 100 UNIT/ML IV SOLN
500.0000 [IU] | Freq: Once | INTRAVENOUS | Status: AC | PRN
  Administered 2014-04-25: 500 [IU]
  Filled 2014-04-25: qty 5

## 2014-04-25 MED ORDER — ACETAMINOPHEN 325 MG PO TABS
650.0000 mg | ORAL_TABLET | Freq: Once | ORAL | Status: AC
  Administered 2014-04-25: 650 mg via ORAL

## 2014-04-25 NOTE — Patient Instructions (Signed)
[facility] Discharge Instructions for Patients Receiving Chemotherapy  Today you received the following chemotherapy agents gazyva  To help prevent nausea and vomiting after your treatment, we encourage you to take your nausea medication as directed    If you develop nausea and vomiting that is not controlled by your nausea medication, call the clinic.   BELOW ARE SYMPTOMS THAT SHOULD BE REPORTED IMMEDIATELY:  *FEVER GREATER THAN 100.5 F  *CHILLS WITH OR WITHOUT FEVER  NAUSEA AND VOMITING THAT IS NOT CONTROLLED WITH YOUR NAUSEA MEDICATION  *UNUSUAL SHORTNESS OF BREATH  *UNUSUAL BRUISING OR BLEEDING  TENDERNESS IN MOUTH AND THROAT WITH OR WITHOUT PRESENCE OF ULCERS  *URINARY PROBLEMS  *BOWEL PROBLEMS  UNUSUAL RASH Items with * indicate a potential emergency and should be followed up as soon as possible.  Feel free to call the clinic you have any questions or concerns. The clinic phone number is [phone].  

## 2014-05-02 ENCOUNTER — Other Ambulatory Visit: Payer: Self-pay | Admitting: Hematology and Oncology

## 2014-05-02 ENCOUNTER — Other Ambulatory Visit (HOSPITAL_BASED_OUTPATIENT_CLINIC_OR_DEPARTMENT_OTHER): Payer: Medicare Other

## 2014-05-02 ENCOUNTER — Ambulatory Visit (HOSPITAL_BASED_OUTPATIENT_CLINIC_OR_DEPARTMENT_OTHER): Payer: Medicare Other

## 2014-05-02 VITALS — BP 143/57 | HR 65 | Temp 98.3°F | Resp 18

## 2014-05-02 DIAGNOSIS — C8590 Non-Hodgkin lymphoma, unspecified, unspecified site: Secondary | ICD-10-CM

## 2014-05-02 DIAGNOSIS — C8300 Small cell B-cell lymphoma, unspecified site: Secondary | ICD-10-CM

## 2014-05-02 DIAGNOSIS — C8584 Other specified types of non-Hodgkin lymphoma, lymph nodes of axilla and upper limb: Secondary | ICD-10-CM

## 2014-05-02 DIAGNOSIS — Z5112 Encounter for antineoplastic immunotherapy: Secondary | ICD-10-CM

## 2014-05-02 LAB — CBC WITH DIFFERENTIAL/PLATELET
BASO%: 1 % (ref 0.0–2.0)
Basophils Absolute: 0.1 10*3/uL (ref 0.0–0.1)
EOS ABS: 0.3 10*3/uL (ref 0.0–0.5)
EOS%: 5.2 % (ref 0.0–7.0)
HCT: 36.1 % (ref 34.8–46.6)
HGB: 11.4 g/dL — ABNORMAL LOW (ref 11.6–15.9)
LYMPH%: 7.2 % — AB (ref 14.0–49.7)
MCH: 27.3 pg (ref 25.1–34.0)
MCHC: 31.7 g/dL (ref 31.5–36.0)
MCV: 86.1 fL (ref 79.5–101.0)
MONO#: 0.8 10*3/uL (ref 0.1–0.9)
MONO%: 12.3 % (ref 0.0–14.0)
NEUT#: 4.6 10*3/uL (ref 1.5–6.5)
NEUT%: 74.3 % (ref 38.4–76.8)
Platelets: 223 10*3/uL (ref 145–400)
RBC: 4.19 10*6/uL (ref 3.70–5.45)
RDW: 15.9 % — AB (ref 11.2–14.5)
WBC: 6.2 10*3/uL (ref 3.9–10.3)
lymph#: 0.4 10*3/uL — ABNORMAL LOW (ref 0.9–3.3)

## 2014-05-02 LAB — COMPREHENSIVE METABOLIC PANEL (CC13)
ALT: 14 U/L (ref 0–55)
ANION GAP: 11 meq/L (ref 3–11)
AST: 16 U/L (ref 5–34)
Albumin: 3.8 g/dL (ref 3.5–5.0)
Alkaline Phosphatase: 96 U/L (ref 40–150)
BILIRUBIN TOTAL: 0.28 mg/dL (ref 0.20–1.20)
BUN: 10 mg/dL (ref 7.0–26.0)
CALCIUM: 10 mg/dL (ref 8.4–10.4)
CHLORIDE: 106 meq/L (ref 98–109)
CO2: 25 mEq/L (ref 22–29)
Creatinine: 0.8 mg/dL (ref 0.6–1.1)
GLUCOSE: 106 mg/dL (ref 70–140)
Potassium: 3.6 mEq/L (ref 3.5–5.1)
Sodium: 142 mEq/L (ref 136–145)
Total Protein: 6.9 g/dL (ref 6.4–8.3)

## 2014-05-02 LAB — LACTATE DEHYDROGENASE (CC13): LDH: 179 U/L (ref 125–245)

## 2014-05-02 LAB — URIC ACID (CC13): URIC ACID, SERUM: 2.9 mg/dL (ref 2.6–7.4)

## 2014-05-02 MED ORDER — DIPHENHYDRAMINE HCL 50 MG/ML IJ SOLN
INTRAMUSCULAR | Status: AC
  Filled 2014-05-02: qty 1

## 2014-05-02 MED ORDER — DEXAMETHASONE SODIUM PHOSPHATE 20 MG/5ML IJ SOLN
20.0000 mg | Freq: Once | INTRAMUSCULAR | Status: AC
  Administered 2014-05-02: 20 mg via INTRAVENOUS

## 2014-05-02 MED ORDER — DEXAMETHASONE SODIUM PHOSPHATE 20 MG/5ML IJ SOLN
INTRAMUSCULAR | Status: AC
  Filled 2014-05-02: qty 5

## 2014-05-02 MED ORDER — SODIUM CHLORIDE 0.9 % IV SOLN
Freq: Once | INTRAVENOUS | Status: AC
  Administered 2014-05-02: 09:00:00 via INTRAVENOUS

## 2014-05-02 MED ORDER — DIPHENHYDRAMINE HCL 50 MG/ML IJ SOLN
50.0000 mg | Freq: Once | INTRAMUSCULAR | Status: AC
  Administered 2014-05-02: 50 mg via INTRAVENOUS

## 2014-05-02 MED ORDER — HEPARIN SOD (PORK) LOCK FLUSH 100 UNIT/ML IV SOLN
500.0000 [IU] | Freq: Once | INTRAVENOUS | Status: AC | PRN
  Administered 2014-05-02: 500 [IU]
  Filled 2014-05-02: qty 5

## 2014-05-02 MED ORDER — ACETAMINOPHEN 325 MG PO TABS
ORAL_TABLET | ORAL | Status: AC
  Filled 2014-05-02: qty 2

## 2014-05-02 MED ORDER — SODIUM CHLORIDE 0.9 % IJ SOLN
10.0000 mL | INTRAMUSCULAR | Status: DC | PRN
  Administered 2014-05-02: 10 mL
  Filled 2014-05-02: qty 10

## 2014-05-02 MED ORDER — ACETAMINOPHEN 325 MG PO TABS
650.0000 mg | ORAL_TABLET | Freq: Once | ORAL | Status: AC
  Administered 2014-05-02: 650 mg via ORAL

## 2014-05-02 MED ORDER — SODIUM CHLORIDE 0.9 % IV SOLN
1000.0000 mg | Freq: Once | INTRAVENOUS | Status: AC
  Administered 2014-05-02: 1000 mg via INTRAVENOUS
  Filled 2014-05-02: qty 40

## 2014-05-02 NOTE — Progress Notes (Signed)
Patient brought leukeran pills to treatment today. Took 2 tablets before treatment started. Cindi Carbon, RN

## 2014-05-02 NOTE — Patient Instructions (Signed)
[facility] Discharge Instructions for Patients Receiving Chemotherapy  Today you received the following chemotherapy agents: Gazyva.  To help prevent nausea and vomiting after your treatment, we encourage you to take your nausea medication as prescribed.   If you develop nausea and vomiting that is not controlled by your nausea medication, call the clinic.   BELOW ARE SYMPTOMS THAT SHOULD BE REPORTED IMMEDIATELY:  *FEVER GREATER THAN 100.5 F  *CHILLS WITH OR WITHOUT FEVER  NAUSEA AND VOMITING THAT IS NOT CONTROLLED WITH YOUR NAUSEA MEDICATION  *UNUSUAL SHORTNESS OF BREATH  *UNUSUAL BRUISING OR BLEEDING  TENDERNESS IN MOUTH AND THROAT WITH OR WITHOUT PRESENCE OF ULCERS  *URINARY PROBLEMS  *BOWEL PROBLEMS  UNUSUAL RASH Items with * indicate a potential emergency and should be followed up as soon as possible.  Feel free to call the clinic you have any questions or concerns. The clinic phone number is [phone].    

## 2014-05-08 ENCOUNTER — Other Ambulatory Visit: Payer: Self-pay | Admitting: Hematology and Oncology

## 2014-05-09 ENCOUNTER — Encounter: Payer: Self-pay | Admitting: Hematology and Oncology

## 2014-05-09 ENCOUNTER — Ambulatory Visit (HOSPITAL_BASED_OUTPATIENT_CLINIC_OR_DEPARTMENT_OTHER): Payer: Medicare Other | Admitting: Hematology and Oncology

## 2014-05-09 ENCOUNTER — Telehealth: Payer: Self-pay | Admitting: Hematology and Oncology

## 2014-05-09 ENCOUNTER — Other Ambulatory Visit: Payer: Self-pay | Admitting: *Deleted

## 2014-05-09 ENCOUNTER — Ambulatory Visit: Payer: Self-pay

## 2014-05-09 ENCOUNTER — Other Ambulatory Visit (HOSPITAL_BASED_OUTPATIENT_CLINIC_OR_DEPARTMENT_OTHER): Payer: Medicare Other

## 2014-05-09 VITALS — BP 127/63 | HR 58 | Temp 97.0°F | Resp 19 | Ht 62.0 in | Wt 135.7 lb

## 2014-05-09 DIAGNOSIS — D701 Agranulocytosis secondary to cancer chemotherapy: Secondary | ICD-10-CM

## 2014-05-09 DIAGNOSIS — C50919 Malignant neoplasm of unspecified site of unspecified female breast: Secondary | ICD-10-CM

## 2014-05-09 DIAGNOSIS — C50912 Malignant neoplasm of unspecified site of left female breast: Secondary | ICD-10-CM

## 2014-05-09 DIAGNOSIS — C8590 Non-Hodgkin lymphoma, unspecified, unspecified site: Secondary | ICD-10-CM

## 2014-05-09 DIAGNOSIS — D61810 Antineoplastic chemotherapy induced pancytopenia: Secondary | ICD-10-CM | POA: Insufficient documentation

## 2014-05-09 DIAGNOSIS — C8584 Other specified types of non-Hodgkin lymphoma, lymph nodes of axilla and upper limb: Secondary | ICD-10-CM

## 2014-05-09 DIAGNOSIS — C7951 Secondary malignant neoplasm of bone: Secondary | ICD-10-CM

## 2014-05-09 DIAGNOSIS — C8300 Small cell B-cell lymphoma, unspecified site: Secondary | ICD-10-CM

## 2014-05-09 DIAGNOSIS — T451X5A Adverse effect of antineoplastic and immunosuppressive drugs, initial encounter: Secondary | ICD-10-CM

## 2014-05-09 DIAGNOSIS — D72819 Decreased white blood cell count, unspecified: Secondary | ICD-10-CM

## 2014-05-09 DIAGNOSIS — R071 Chest pain on breathing: Secondary | ICD-10-CM

## 2014-05-09 DIAGNOSIS — R0789 Other chest pain: Secondary | ICD-10-CM | POA: Insufficient documentation

## 2014-05-09 DIAGNOSIS — D630 Anemia in neoplastic disease: Secondary | ICD-10-CM

## 2014-05-09 LAB — COMPREHENSIVE METABOLIC PANEL (CC13)
ALT: 15 U/L (ref 0–55)
AST: 16 U/L (ref 5–34)
Albumin: 3.8 g/dL (ref 3.5–5.0)
Alkaline Phosphatase: 78 U/L (ref 40–150)
Anion Gap: 10 mEq/L (ref 3–11)
BUN: 9.1 mg/dL (ref 7.0–26.0)
CO2: 24 meq/L (ref 22–29)
Calcium: 10.2 mg/dL (ref 8.4–10.4)
Chloride: 108 mEq/L (ref 98–109)
Creatinine: 0.8 mg/dL (ref 0.6–1.1)
Glucose: 89 mg/dl (ref 70–140)
Potassium: 3.9 mEq/L (ref 3.5–5.1)
Sodium: 141 mEq/L (ref 136–145)
TOTAL PROTEIN: 6.7 g/dL (ref 6.4–8.3)
Total Bilirubin: 0.39 mg/dL (ref 0.20–1.20)

## 2014-05-09 LAB — CBC WITH DIFFERENTIAL/PLATELET
BASO%: 1.9 % (ref 0.0–2.0)
Basophils Absolute: 0.1 10*3/uL (ref 0.0–0.1)
EOS%: 9.1 % — AB (ref 0.0–7.0)
Eosinophils Absolute: 0.3 10*3/uL (ref 0.0–0.5)
HCT: 35.1 % (ref 34.8–46.6)
HGB: 11.1 g/dL — ABNORMAL LOW (ref 11.6–15.9)
LYMPH%: 10.1 % — AB (ref 14.0–49.7)
MCH: 27.3 pg (ref 25.1–34.0)
MCHC: 31.6 g/dL (ref 31.5–36.0)
MCV: 86.5 fL (ref 79.5–101.0)
MONO#: 0.7 10*3/uL (ref 0.1–0.9)
MONO%: 23.6 % — ABNORMAL HIGH (ref 0.0–14.0)
NEUT#: 1.6 10*3/uL (ref 1.5–6.5)
NEUT%: 55.3 % (ref 38.4–76.8)
PLATELETS: 170 10*3/uL (ref 145–400)
RBC: 4.06 10*6/uL (ref 3.70–5.45)
RDW: 16 % — ABNORMAL HIGH (ref 11.2–14.5)
WBC: 3 10*3/uL — ABNORMAL LOW (ref 3.9–10.3)
lymph#: 0.3 10*3/uL — ABNORMAL LOW (ref 0.9–3.3)

## 2014-05-09 LAB — URIC ACID (CC13): Uric Acid, Serum: 3.4 mg/dl (ref 2.6–7.4)

## 2014-05-09 LAB — LACTATE DEHYDROGENASE (CC13): LDH: 163 U/L (ref 125–245)

## 2014-05-09 MED ORDER — ACYCLOVIR 400 MG PO TABS: 400.0000 mg | ORAL_TABLET | Freq: Every day | ORAL | Status: DC

## 2014-05-09 MED ORDER — HYDROCODONE-ACETAMINOPHEN 5-325 MG PO TABS: 1.0000 | ORAL_TABLET | Freq: Four times a day (QID) | ORAL | Status: DC | PRN

## 2014-05-09 NOTE — Assessment & Plan Note (Signed)
She has no evidence of active metastatic disease. She will continue on Arimidex indefinitely.

## 2014-05-09 NOTE — Telephone Encounter (Signed)
gv and printed appts ched and avs for pt for July and Aug...sed added tx. °

## 2014-05-09 NOTE — Assessment & Plan Note (Signed)
This is likely anemia of chronic disease. The patient denies recent history of bleeding such as epistaxis, hematuria or hematochezia. She is asymptomatic from the anemia. We will observe for now.  She does not require transfusion now.   

## 2014-05-09 NOTE — Assessment & Plan Note (Signed)
This is likely due to recent treatment. The patient denies recent history of fevers, cough, chills, diarrhea or dysuria. She is asymptomatic from the leukopenia. I will observe for now.  I will continue the chemotherapy at current dose without dosage adjustment.  If the leukopenia gets progressive worse in the future, I might have to delay her treatment or adjust the chemotherapy dose.   

## 2014-05-09 NOTE — Assessment & Plan Note (Signed)
This is postoperative in nature. I refilled her prescription pain medicine today.

## 2014-05-09 NOTE — Progress Notes (Signed)
[facility] OFFICE PROGRESS NOTE  Patient Care Team: [name], MD as Consulting Physician (Hematology and Oncology)  SUMMARY OF ONCOLOGIC HISTORY:  #1 left breast carcinoma originally presenting as a fungating mass and was diagnosed with metastatic breast cancer in October 2009. Patient underwent neoadjuvant chemotherapy followed by mastectomy. She is currently on Arimidex 1mg  daily and monthly Xgeva. 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 She received 6 cycles of CVP and Rituxan, followed by maintenance Rituxan. Later, her disease recurred and underwent 4 cycles of R-Bendamustine. PET/CT showed CR and then she proceeded to maintenance Rituxan.  #3 In April 2015, repeat CT scan show new lymphadenopathy. #4 on 04/03/2014, axillary lymph node dissection showed recurrence of CLL/SLL #5 On 04/18/14: She was started on Obinutuzumab INTERVAL HISTORY: Please see below for problem oriented charting. She tolerated treatment well without allergic reactions. She said her persistent pain at the surgical sites on palpation and on average takes one pain pill per day. She denies new lymphadenopathy. She denies any recent abnormal breast examination, palpable mass, abnormal breast appearance or nipple changes  REVIEW OF SYSTEMS:   Constitutional: Denies fevers, chills or abnormal weight loss Eyes: Denies blurriness of vision Ears, nose, mouth, throat, and face: Denies mucositis or sore throat Respiratory: Denies cough, dyspnea or wheezes Cardiovascular: Denies palpitation, chest discomfort or lower extremity swelling Gastrointestinal:  Denies nausea, heartburn or change in bowel habits Skin: Denies abnormal skin rashes Lymphatics: Denies new lymphadenopathy or easy bruising Neurological:Denies numbness, tingling or new weaknesses Behavioral/Psych: Mood is stable, no new changes  All other  systems were reviewed with the patient and are negative.  I have reviewed the past medical history, past surgical history, social history and family history with the patient and they are unchanged from previous note.  ALLERGIES:  is allergic to codeine; penicillins; sulfonamide derivatives; tape; and tramadol hcl.  MEDICATIONS:  Current Outpatient Prescriptions  Medication Sig Dispense Refill  . allopurinol (ZYLOPRIM) 300 MG tablet Take 1 tablet (300 mg total) by mouth daily.  30 tablet  1  . anastrozole (ARIMIDEX) 1 MG tablet Take 1 tablet (1 mg total) by mouth daily.  30 tablet  5  . chlorambucil (LEUKERAN) 2 MG tablet Give on an empty stomach 1 hour before or 2 hours after meals. Take 2 tablets on the day of chemo treatment every other week  20 tablet  0  . gatifloxacin (ZYMAXID) 0.5 % SOLN Place 1 drop into both eyes 4 (four) times daily.      [name] HYDROcodone-acetaminophen (NORCO) 5-325 MG per tablet Take 1 tablet by mouth every 6 (six) hours as needed for moderate pain.  60 tablet  0  . prednisoLONE acetate (PRED FORTE) 1 % ophthalmic suspension Place 1 drop into both eyes 4 (four) times daily.      [name] acyclovir (ZOVIRAX) 400 MG tablet Take 1 tablet (400 mg total) by mouth daily.  30 tablet  6   No current facility-administered medications for this visit.    PHYSICAL EXAMINATION: ECOG PERFORMANCE STATUS: 1 - Symptomatic but completely ambulatory  Filed Vitals:   05/09/14 0845  BP: 127/63  Pulse: 58  Temp: 97 F (36.1 C)  Resp: 19   Filed Weights   05/09/14 0845  Weight: 135 lb 11.2 oz (61.553 kg)    GENERAL:alert, no distress and comfortable. She looks thin and cachectic SKIN: skin color, texture, turgor are normal, no rashes or significant lesions  EYES: normal, Conjunctiva are pink and non-injected, sclera clear OROPHARYNX:no exudate, no erythema and lips, buccal mucosa, and tongue normal  NECK: supple, thyroid normal size, non-tender, without nodularity LYMPH:  no  palpable lymphadenopathy in the cervical, axillary or inguinal LUNGS: clear to auscultation and percussion with normal breathing effort HEART: regular rate & rhythm and no murmurs and no lower extremity edema ABDOMEN:abdomen soft, non-tender and normal bowel sounds Musculoskeletal:no cyanosis of digits and no clubbing  NEURO: alert & oriented x 3 with fluent speech, no focal motor/sensory deficits  LABORATORY DATA:  I have reviewed the data as listed    Component Value Date/Time   NA 141 05/09/2014 0839   NA 136 07/06/2012 1122   NA 142 06/01/2012 1008   K 3.9 05/09/2014 0839   K 3.8 07/06/2012 1122   K 3.8 06/01/2012 1008   CL 107 04/22/2013 0922   CL 102 07/06/2012 1122   CL 103 06/01/2012 1008   CO2 24 05/09/2014 0839   CO2 20 07/06/2012 1122   CO2 25 06/01/2012 1008   GLUCOSE 89 05/09/2014 0839   GLUCOSE 89 04/22/2013 0922   GLUCOSE 182* 07/06/2012 1122   GLUCOSE 108 06/01/2012 1008   BUN 9.1 05/09/2014 0839   BUN 8 07/06/2012 1122   BUN 9 06/01/2012 1008   CREATININE 0.8 05/09/2014 0839   CREATININE 0.60 07/06/2012 1122   CREATININE 0.9 06/01/2012 1008   CALCIUM 10.2 05/09/2014 0839   CALCIUM 9.1 07/06/2012 1122   CALCIUM 9.8 06/01/2012 1008   PROT 6.7 05/09/2014 0839   PROT 7.7 02/10/2012 1035   PROT 8.1 08/14/2010 1251   ALBUMIN 3.8 05/09/2014 0839   ALBUMIN 4.3 02/10/2012 1035   AST 16 05/09/2014 0839   AST 20 02/10/2012 1035   AST 19 08/14/2010 1251   ALT 15 05/09/2014 0839   ALT 19 02/10/2012 1035   ALT 9* 08/14/2010 1251   ALKPHOS 78 05/09/2014 0839   ALKPHOS 69 02/10/2012 1035   ALKPHOS 78 08/14/2010 1251   BILITOT 0.39 05/09/2014 0839   BILITOT 0.4 02/10/2012 1035   BILITOT 0.40 08/14/2010 1251   GFRNONAA 87* 07/06/2012 1122   GFRAA >90 07/06/2012 1122    No results found for this basename: SPEP,  UPEP,   kappa and lambda light chains    Lab Results  Component Value Date   WBC 3.0* 05/09/2014   NEUTROABS 1.6 05/09/2014   HGB 11.1* 05/09/2014   HCT 35.1 05/09/2014   MCV 86.5 05/09/2014   PLT 170 05/09/2014       Chemistry      Component Value Date/Time   NA 141 05/09/2014 0839   NA 136 07/06/2012 1122   NA 142 06/01/2012 1008   K 3.9 05/09/2014 0839   K 3.8 07/06/2012 1122   K 3.8 06/01/2012 1008   CL 107 04/22/2013 0922   CL 102 07/06/2012 1122   CL 103 06/01/2012 1008   CO2 24 05/09/2014 0839   CO2 20 07/06/2012 1122   CO2 25 06/01/2012 1008   BUN 9.1 05/09/2014 0839   BUN 8 07/06/2012 1122   BUN 9 06/01/2012 1008   CREATININE 0.8 05/09/2014 0839   CREATININE 0.60 07/06/2012 1122   CREATININE 0.9 06/01/2012 1008      Component Value Date/Time   CALCIUM 10.2 05/09/2014 0839   CALCIUM 9.1 07/06/2012 1122   CALCIUM 9.8 06/01/2012 1008   ALKPHOS 78 05/09/2014 0839   ALKPHOS 69 02/10/2012 1035   ALKPHOS 78 08/14/2010 1251  AST 16 05/09/2014 0839   AST 20 02/10/2012 1035   AST 19 08/14/2010 1251   ALT 15 05/09/2014 0839   ALT 19 02/10/2012 1035   ALT 9* 08/14/2010 1251   BILITOT 0.39 05/09/2014 0839   BILITOT 0.4 02/10/2012 1035   BILITOT 0.40 08/14/2010 1251      ASSESSMENT & PLAN:  Lymphoma, small lymphocytic She tolerated chemotherapy well without side effects. I reinforced the importance of acyclovir for antimicrobial prophylaxis. She will proceed with cycle 2 treatment next week on a monthly basis and she will only take chlorambucil on the days of her chemotherapy.  Breast CA She has no evidence of active metastatic disease. She will continue on Arimidex indefinitely.    Anemia in neoplastic disease This is likely anemia of chronic disease. The patient denies recent history of bleeding such as epistaxis, hematuria or hematochezia. She is asymptomatic from the anemia. We will observe for now.  She does not require transfusion now.    Chest wall tenderness This is postoperative in nature. I refilled her prescription pain medicine today.  Leukopenia due to antineoplastic chemotherapy This is likely due to recent treatment. The patient denies recent history of fevers, cough, chills, diarrhea or dysuria. She is  asymptomatic from the leukopenia. I will observe for now.  I will continue the chemotherapy at current dose without dosage adjustment.  If the leukopenia gets progressive worse in the future, I might have to delay her treatment or adjust the chemotherapy dose.        All questions were answered. The patient knows to call the clinic with any problems, questions or concerns. No barriers to learning was detected. I spent 30 minutes counseling the patient face to face. The total time spent in the appointment was 45 minutes and more than 50% was on counseling and review of test results     [facility], [place], MD 05/09/2014 12:24 PM

## 2014-05-09 NOTE — Telephone Encounter (Signed)
Confirmed with patient to send script to [facility]

## 2014-05-09 NOTE — Assessment & Plan Note (Signed)
She tolerated chemotherapy well without side effects. I reinforced the importance of acyclovir for antimicrobial prophylaxis. She will proceed with cycle 2 treatment next week on a monthly basis and she will only take chlorambucil on the days of her chemotherapy.

## 2014-05-16 ENCOUNTER — Ambulatory Visit (HOSPITAL_BASED_OUTPATIENT_CLINIC_OR_DEPARTMENT_OTHER): Payer: Medicare Other

## 2014-05-16 VITALS — BP 166/66 | HR 60 | Temp 97.8°F | Resp 18

## 2014-05-16 DIAGNOSIS — Z5112 Encounter for antineoplastic immunotherapy: Secondary | ICD-10-CM

## 2014-05-16 DIAGNOSIS — C8584 Other specified types of non-Hodgkin lymphoma, lymph nodes of axilla and upper limb: Secondary | ICD-10-CM

## 2014-05-16 DIAGNOSIS — C50919 Malignant neoplasm of unspecified site of unspecified female breast: Secondary | ICD-10-CM

## 2014-05-16 DIAGNOSIS — C8300 Small cell B-cell lymphoma, unspecified site: Secondary | ICD-10-CM

## 2014-05-16 MED ORDER — DIPHENHYDRAMINE HCL 50 MG/ML IJ SOLN
INTRAMUSCULAR | Status: AC
  Filled 2014-05-16: qty 1

## 2014-05-16 MED ORDER — ACETAMINOPHEN 325 MG PO TABS
650.0000 mg | ORAL_TABLET | Freq: Once | ORAL | Status: AC
  Administered 2014-05-16: 650 mg via ORAL

## 2014-05-16 MED ORDER — HEPARIN SOD (PORK) LOCK FLUSH 100 UNIT/ML IV SOLN
500.0000 [IU] | Freq: Once | INTRAVENOUS | Status: AC | PRN
  Administered 2014-05-16: 500 [IU]
  Filled 2014-05-16: qty 5

## 2014-05-16 MED ORDER — DEXAMETHASONE SODIUM PHOSPHATE 20 MG/5ML IJ SOLN
20.0000 mg | Freq: Once | INTRAMUSCULAR | Status: AC
  Administered 2014-05-16: 20 mg via INTRAVENOUS

## 2014-05-16 MED ORDER — DEXAMETHASONE SODIUM PHOSPHATE 20 MG/5ML IJ SOLN
INTRAMUSCULAR | Status: AC
  Filled 2014-05-16: qty 5

## 2014-05-16 MED ORDER — DIPHENHYDRAMINE HCL 50 MG/ML IJ SOLN
50.0000 mg | Freq: Once | INTRAMUSCULAR | Status: AC
  Administered 2014-05-16: 50 mg via INTRAVENOUS

## 2014-05-16 MED ORDER — SODIUM CHLORIDE 0.9 % IV SOLN
Freq: Once | INTRAVENOUS | Status: AC
  Administered 2014-05-16: 10:00:00 via INTRAVENOUS

## 2014-05-16 MED ORDER — SODIUM CHLORIDE 0.9 % IV SOLN
1000.0000 mg | Freq: Once | INTRAVENOUS | Status: AC
  Administered 2014-05-16: 100 mg via INTRAVENOUS
  Filled 2014-05-16: qty 40

## 2014-05-16 MED ORDER — SODIUM CHLORIDE 0.9 % IJ SOLN
10.0000 mL | INTRAMUSCULAR | Status: DC | PRN
  Administered 2014-05-16: 10 mL
  Filled 2014-05-16: qty 10

## 2014-05-16 MED ORDER — ACETAMINOPHEN 325 MG PO TABS
ORAL_TABLET | ORAL | Status: AC
  Filled 2014-05-16: qty 2

## 2014-05-16 NOTE — Patient Instructions (Signed)
[facility] Discharge Instructions for Patients Receiving Chemotherapy  Today you received the following chemotherapy agents: Gazyva.  To help prevent nausea and vomiting after your treatment, we encourage you to take your nausea medication as prescribed.   If you develop nausea and vomiting that is not controlled by your nausea medication, call the clinic.   BELOW ARE SYMPTOMS THAT SHOULD BE REPORTED IMMEDIATELY:  *FEVER GREATER THAN 100.5 F  *CHILLS WITH OR WITHOUT FEVER  NAUSEA AND VOMITING THAT IS NOT CONTROLLED WITH YOUR NAUSEA MEDICATION  *UNUSUAL SHORTNESS OF BREATH  *UNUSUAL BRUISING OR BLEEDING  TENDERNESS IN MOUTH AND THROAT WITH OR WITHOUT PRESENCE OF ULCERS  *URINARY PROBLEMS  *BOWEL PROBLEMS  UNUSUAL RASH Items with * indicate a potential emergency and should be followed up as soon as possible.  Feel free to call the clinic you have any questions or concerns. The clinic phone number is [phone].    

## 2014-05-16 NOTE — Progress Notes (Signed)
Per Dr. Alvy Bimler, okay to use labs from 05/09/14 for treatment today.  [facility] [employer] aware of pt's need for transport home. States they are sending transport.  Per pt request, message sent to Dr. Alvy Bimler regarding elevated BPs.

## 2014-06-13 ENCOUNTER — Ambulatory Visit (HOSPITAL_BASED_OUTPATIENT_CLINIC_OR_DEPARTMENT_OTHER): Payer: Medicare Other | Admitting: Hematology and Oncology

## 2014-06-13 ENCOUNTER — Ambulatory Visit (HOSPITAL_BASED_OUTPATIENT_CLINIC_OR_DEPARTMENT_OTHER): Payer: Medicare Other

## 2014-06-13 ENCOUNTER — Encounter: Payer: Self-pay | Admitting: Hematology and Oncology

## 2014-06-13 ENCOUNTER — Other Ambulatory Visit (HOSPITAL_BASED_OUTPATIENT_CLINIC_OR_DEPARTMENT_OTHER): Payer: Medicare Other

## 2014-06-13 ENCOUNTER — Telehealth: Payer: Self-pay | Admitting: Hematology and Oncology

## 2014-06-13 ENCOUNTER — Telehealth: Payer: Self-pay | Admitting: *Deleted

## 2014-06-13 VITALS — BP 136/58 | HR 57 | Temp 98.2°F | Resp 18

## 2014-06-13 VITALS — BP 129/80 | HR 68 | Temp 98.0°F | Resp 18 | Ht 62.0 in | Wt 133.8 lb

## 2014-06-13 DIAGNOSIS — C8584 Other specified types of non-Hodgkin lymphoma, lymph nodes of axilla and upper limb: Secondary | ICD-10-CM

## 2014-06-13 DIAGNOSIS — C50912 Malignant neoplasm of unspecified site of left female breast: Secondary | ICD-10-CM

## 2014-06-13 DIAGNOSIS — C8300 Small cell B-cell lymphoma, unspecified site: Secondary | ICD-10-CM

## 2014-06-13 DIAGNOSIS — C50919 Malignant neoplasm of unspecified site of unspecified female breast: Secondary | ICD-10-CM

## 2014-06-13 DIAGNOSIS — D630 Anemia in neoplastic disease: Secondary | ICD-10-CM

## 2014-06-13 DIAGNOSIS — C8590 Non-Hodgkin lymphoma, unspecified, unspecified site: Secondary | ICD-10-CM

## 2014-06-13 DIAGNOSIS — Z5112 Encounter for antineoplastic immunotherapy: Secondary | ICD-10-CM

## 2014-06-13 LAB — URIC ACID: URIC ACID, SERUM: 5.5 mg/dL (ref 2.4–7.0)

## 2014-06-13 LAB — CBC WITH DIFFERENTIAL/PLATELET
BASO%: 0.2 % (ref 0.0–2.0)
Basophils Absolute: 0 10*3/uL (ref 0.0–0.1)
EOS ABS: 0.3 10*3/uL (ref 0.0–0.5)
EOS%: 7.3 % — ABNORMAL HIGH (ref 0.0–7.0)
HCT: 35.7 % (ref 34.8–46.6)
HGB: 11.3 g/dL — ABNORMAL LOW (ref 11.6–15.9)
LYMPH%: 11.1 % — AB (ref 14.0–49.7)
MCH: 27.4 pg (ref 25.1–34.0)
MCHC: 31.7 g/dL (ref 31.5–36.0)
MCV: 86.7 fL (ref 79.5–101.0)
MONO#: 0.6 10*3/uL (ref 0.1–0.9)
MONO%: 14.5 % — AB (ref 0.0–14.0)
NEUT#: 2.8 10*3/uL (ref 1.5–6.5)
NEUT%: 66.9 % (ref 38.4–76.8)
PLATELETS: 218 10*3/uL (ref 145–400)
RBC: 4.12 10*6/uL (ref 3.70–5.45)
RDW: 15.6 % — ABNORMAL HIGH (ref 11.2–14.5)
WBC: 4.2 10*3/uL (ref 3.9–10.3)
lymph#: 0.5 10*3/uL — ABNORMAL LOW (ref 0.9–3.3)

## 2014-06-13 LAB — LACTATE DEHYDROGENASE: LDH: 309 U/L — AB (ref 94–250)

## 2014-06-13 LAB — COMPREHENSIVE METABOLIC PANEL
ALBUMIN: 3.9 g/dL (ref 3.5–5.2)
ALK PHOS: 89 U/L (ref 39–117)
ALT: 13 U/L (ref 0–35)
AST: 23 U/L (ref 0–37)
BILIRUBIN TOTAL: 0.2 mg/dL — AB (ref 0.2–1.2)
BUN: 13 mg/dL (ref 6–23)
CO2: 25 meq/L (ref 19–32)
Calcium: 10.8 mg/dL — ABNORMAL HIGH (ref 8.4–10.5)
Chloride: 101 mEq/L (ref 96–112)
Creatinine, Ser: 0.81 mg/dL (ref 0.50–1.10)
GLUCOSE: 98 mg/dL (ref 70–99)
POTASSIUM: 4 meq/L (ref 3.5–5.3)
Sodium: 140 mEq/L (ref 135–145)
Total Protein: 7.5 g/dL (ref 6.0–8.3)

## 2014-06-13 MED ORDER — SODIUM CHLORIDE 0.9 % IJ SOLN
10.0000 mL | INTRAMUSCULAR | Status: DC | PRN
  Administered 2014-06-13: 10 mL
  Filled 2014-06-13: qty 10

## 2014-06-13 MED ORDER — DEXAMETHASONE SODIUM PHOSPHATE 20 MG/5ML IJ SOLN
20.0000 mg | Freq: Once | INTRAMUSCULAR | Status: AC
  Administered 2014-06-13: 20 mg via INTRAVENOUS

## 2014-06-13 MED ORDER — HEPARIN SOD (PORK) LOCK FLUSH 100 UNIT/ML IV SOLN
500.0000 [IU] | Freq: Once | INTRAVENOUS | Status: AC | PRN
  Administered 2014-06-13: 500 [IU]
  Filled 2014-06-13: qty 5

## 2014-06-13 MED ORDER — DIPHENHYDRAMINE HCL 50 MG/ML IJ SOLN
INTRAMUSCULAR | Status: AC
  Filled 2014-06-13: qty 1

## 2014-06-13 MED ORDER — SODIUM CHLORIDE 0.9 % IV SOLN
Freq: Once | INTRAVENOUS | Status: AC
  Administered 2014-06-13: 10:00:00 via INTRAVENOUS

## 2014-06-13 MED ORDER — ACETAMINOPHEN 325 MG PO TABS
ORAL_TABLET | ORAL | Status: AC
  Filled 2014-06-13: qty 2

## 2014-06-13 MED ORDER — OBINUTUZUMAB CHEMO INJECTION 1000 MG/40ML
1000.0000 mg | Freq: Once | INTRAVENOUS | Status: AC
  Administered 2014-06-13: 1000 mg via INTRAVENOUS
  Filled 2014-06-13: qty 40

## 2014-06-13 MED ORDER — ACETAMINOPHEN 325 MG PO TABS
650.0000 mg | ORAL_TABLET | Freq: Once | ORAL | Status: AC
  Administered 2014-06-13: 650 mg via ORAL

## 2014-06-13 MED ORDER — DIPHENHYDRAMINE HCL 50 MG/ML IJ SOLN
50.0000 mg | Freq: Once | INTRAMUSCULAR | Status: AC
  Administered 2014-06-13: 50 mg via INTRAVENOUS

## 2014-06-13 MED ORDER — DEXAMETHASONE SODIUM PHOSPHATE 20 MG/5ML IJ SOLN
INTRAMUSCULAR | Status: AC
  Filled 2014-06-13: qty 5

## 2014-06-13 NOTE — Telephone Encounter (Signed)
Per staff message and POF I have scheduled appts. Advised scheduler of appts. JMW  

## 2014-06-13 NOTE — Assessment & Plan Note (Signed)
She has no evidence of active metastatic disease. She will continue on Arimidex indefinitely.

## 2014-06-13 NOTE — Progress Notes (Signed)
[facility] OFFICE PROGRESS NOTE  Patient Care Team: No Pcp Per Patient as PCP - General (General Practice) [name], MD as Consulting Physician (Hematology and Oncology)  SUMMARY OF ONCOLOGIC HISTORY: #1 left breast carcinoma originally presenting as a fungating mass and was diagnosed with metastatic breast cancer in October 2009. Patient underwent neoadjuvant chemotherapy followed by mastectomy. She is currently on Arimidex 1mg  daily and monthly Xgeva. 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 She received 6 cycles of CVP and Rituxan, followed by maintenance Rituxan. Later, her disease recurred and underwent 4 cycles of R-Bendamustine. PET/CT showed CR and then she proceeded to maintenance Rituxan.  #3 In April 2015, repeat CT scan show new lymphadenopathy. #4 on 04/03/2014, axillary lymph node dissection showed recurrence of CLL/SLL #5 On 04/18/14: She was started on Obinutuzumab INTERVAL HISTORY: Please see below for problem oriented charting. She tolerated treatment well without allergic reactions. She denies further pain at the lymph node excision site. She denies new lymphadenopathy. She denies any recent abnormal breast examination, palpable mass, abnormal breast appearance or nipple changes  REVIEW OF SYSTEMS:   Constitutional: Denies fevers, chills or abnormal weight loss Eyes: Denies blurriness of vision Ears, nose, mouth, throat, and face: Denies mucositis or sore throat Respiratory: Denies cough, dyspnea or wheezes Cardiovascular: Denies palpitation, chest discomfort or lower extremity swelling Gastrointestinal:  Denies nausea, heartburn or change in bowel habits Skin: Denies abnormal skin rashes Lymphatics: Denies new lymphadenopathy or easy bruising Neurological:Denies numbness, tingling or new weaknesses Behavioral/Psych: Mood is stable, no new changes  All  other systems were reviewed with the patient and are negative.  I have reviewed the past medical history, past surgical history, social history and family history with the patient and they are unchanged from previous note.  ALLERGIES:  is allergic to codeine; penicillins; sulfonamide derivatives; tape; and tramadol hcl.  MEDICATIONS:  Current Outpatient Prescriptions  Medication Sig Dispense Refill  . anastrozole (ARIMIDEX) 1 MG tablet Take 1 tablet (1 mg total) by mouth daily.  30 tablet  5  . chlorambucil (LEUKERAN) 2 MG tablet Give on an empty stomach 1 hour before or 2 hours after meals. Take 2 tablets on the day of chemo treatment every other week  20 tablet  0  . gatifloxacin (ZYMAXID) 0.5 % SOLN Place 1 drop into both eyes 4 (four) times daily.      [name] HYDROcodone-acetaminophen (NORCO) 5-325 MG per tablet Take 1 tablet by mouth every 6 (six) hours as needed for moderate pain.  60 tablet  0  . prednisoLONE acetate (PRED FORTE) 1 % ophthalmic suspension Place 1 drop into both eyes 4 (four) times daily.      [name] acyclovir (ZOVIRAX) 400 MG tablet Take 1 tablet (400 mg total) by mouth daily.  30 tablet  6  . allopurinol (ZYLOPRIM) 300 MG tablet Take 1 tablet (300 mg total) by mouth daily.  30 tablet  1   No current facility-administered medications for this visit.    PHYSICAL EXAMINATION: ECOG PERFORMANCE STATUS: 0 - Asymptomatic  Filed Vitals:   06/13/14 0833  BP: 129/80  Pulse: 68  Temp: 98 F (36.7 C)  Resp: 18   Filed Weights   06/13/14 0833  Weight: 133 lb 12.8 oz (60.691 kg)    GENERAL:alert, no distress and comfortable SKIN: skin color, texture, turgor are normal, no rashes or significant lesions EYES: normal, Conjunctiva are pink and non-injected, sclera clear  OROPHARYNX:no exudate, no erythema and lips, buccal mucosa, and tongue normal  NECK: supple, thyroid normal size, non-tender, without nodularity LYMPH:  no palpable lymphadenopathy in the cervical, axillary or  inguinal. Prior lymph node excision site has healed LUNGS: clear to auscultation and percussion with normal breathing effort HEART: regular rate & rhythm and no murmurs and no lower extremity edema ABDOMEN:abdomen soft, non-tender and normal bowel sounds Musculoskeletal:no cyanosis of digits and no clubbing  NEURO: alert & oriented x 3 with fluent speech, no focal motor/sensory deficits  LABORATORY DATA:  I have reviewed the data as listed    Component Value Date/Time   NA 140 06/13/2014 0819   NA 141 05/09/2014 0839   NA 142 06/01/2012 1008   K 4.0 06/13/2014 0819   K 3.9 05/09/2014 0839   K 3.8 06/01/2012 1008   CL 101 06/13/2014 0819   CL 107 04/22/2013 0922   CL 103 06/01/2012 1008   CO2 25 06/13/2014 0819   CO2 24 05/09/2014 0839   CO2 25 06/01/2012 1008   GLUCOSE 98 06/13/2014 0819   GLUCOSE 89 05/09/2014 0839   GLUCOSE 89 04/22/2013 0922   GLUCOSE 108 06/01/2012 1008   BUN 13 06/13/2014 0819   BUN 9.1 05/09/2014 0839   BUN 9 06/01/2012 1008   CREATININE 0.81 06/13/2014 0819   CREATININE 0.8 05/09/2014 0839   CREATININE 0.9 06/01/2012 1008   CALCIUM 10.8* 06/13/2014 0819   CALCIUM 10.2 05/09/2014 0839   CALCIUM 9.8 06/01/2012 1008   PROT 7.5 06/13/2014 0819   PROT 6.7 05/09/2014 0839   PROT 8.1 08/14/2010 1251   ALBUMIN 3.9 06/13/2014 0819   ALBUMIN 3.8 05/09/2014 0839   AST 23 06/13/2014 0819   AST 16 05/09/2014 0839   AST 19 08/14/2010 1251   ALT 13 06/13/2014 0819   ALT 15 05/09/2014 0839   ALT 9* 08/14/2010 1251   ALKPHOS 89 06/13/2014 0819   ALKPHOS 78 05/09/2014 0839   ALKPHOS 78 08/14/2010 1251   BILITOT 0.2* 06/13/2014 0819   BILITOT 0.39 05/09/2014 0839   BILITOT 0.40 08/14/2010 1251   GFRNONAA 87* 07/06/2012 1122   GFRAA >90 07/06/2012 1122    No results found for this basename: SPEP,  UPEP,   kappa and lambda light chains    Lab Results  Component Value Date   WBC 4.2 06/13/2014   NEUTROABS 2.8 06/13/2014   HGB 11.3* 06/13/2014   HCT 35.7 06/13/2014   MCV 86.7 06/13/2014   PLT 218 06/13/2014       Chemistry      Component Value Date/Time   NA 140 06/13/2014 0819   NA 141 05/09/2014 0839   NA 142 06/01/2012 1008   K 4.0 06/13/2014 0819   K 3.9 05/09/2014 0839   K 3.8 06/01/2012 1008   CL 101 06/13/2014 0819   CL 107 04/22/2013 0922   CL 103 06/01/2012 1008   CO2 25 06/13/2014 0819   CO2 24 05/09/2014 0839   CO2 25 06/01/2012 1008   BUN 13 06/13/2014 0819   BUN 9.1 05/09/2014 0839   BUN 9 06/01/2012 1008   CREATININE 0.81 06/13/2014 0819   CREATININE 0.8 05/09/2014 0839   CREATININE 0.9 06/01/2012 1008      Component Value Date/Time   CALCIUM 10.8* 06/13/2014 0819   CALCIUM 10.2 05/09/2014 0839   CALCIUM 9.8 06/01/2012 1008   ALKPHOS 89 06/13/2014 0819   ALKPHOS 78 05/09/2014 0839   ALKPHOS 78 08/14/2010 1251   AST 23  06/13/2014 0819   AST 16 05/09/2014 0839   AST 19 08/14/2010 1251   ALT 13 06/13/2014 0819   ALT 15 05/09/2014 0839   ALT 9* 08/14/2010 1251   BILITOT 0.2* 06/13/2014 0819   BILITOT 0.39 05/09/2014 0839   BILITOT 0.40 08/14/2010 1251      ASSESSMENT & PLAN:  Lymphoma, small lymphocytic She tolerated chemotherapy well without side effects. I reinforced the importance of acyclovir for antimicrobial prophylaxis. She will proceed with cycle 3 treatment today and on a monthly basis and she will only take chlorambucil on the days of her chemotherapy.    Breast CA She has no evidence of active metastatic disease. She will continue on Arimidex indefinitely.      Anemia in neoplastic disease This is likely anemia of chronic disease. The patient denies recent history of bleeding such as epistaxis, hematuria or hematochezia. She is asymptomatic from the anemia. We will observe for now.  She does not require transfusion now.        No orders of the defined types were placed in this encounter.   All questions were answered. The patient knows to call the clinic with any problems, questions or concerns. No barriers to learning was detected. I spent 25 minutes counseling the  patient face to face. The total time spent in the appointment was 30 minutes and more than 50% was on counseling and review of test results     [facility], [place], MD 06/13/2014 9:15 AM

## 2014-06-13 NOTE — Assessment & Plan Note (Signed)
She tolerated chemotherapy well without side effects. I reinforced the importance of acyclovir for antimicrobial prophylaxis. She will proceed with cycle 3 treatment today and on a monthly basis and she will only take chlorambucil on the days of her chemotherapy.

## 2014-06-13 NOTE — Assessment & Plan Note (Signed)
This is likely anemia of chronic disease. The patient denies recent history of bleeding such as epistaxis, hematuria or hematochezia. She is asymptomatic from the anemia. We will observe for now.  She does not require transfusion now.   

## 2014-06-13 NOTE — Patient Instructions (Signed)
[facility] Discharge Instructions for Patients Receiving Chemotherapy  Today you received the following chemotherapy agents: Gazyva.  To help prevent nausea and vomiting after your treatment, we encourage you to take your nausea medication as prescribed.   If you develop nausea and vomiting that is not controlled by your nausea medication, call the clinic.   BELOW ARE SYMPTOMS THAT SHOULD BE REPORTED IMMEDIATELY:  *FEVER GREATER THAN 100.5 F  *CHILLS WITH OR WITHOUT FEVER  NAUSEA AND VOMITING THAT IS NOT CONTROLLED WITH YOUR NAUSEA MEDICATION  *UNUSUAL SHORTNESS OF BREATH  *UNUSUAL BRUISING OR BLEEDING  TENDERNESS IN MOUTH AND THROAT WITH OR WITHOUT PRESENCE OF ULCERS  *URINARY PROBLEMS  *BOWEL PROBLEMS  UNUSUAL RASH Items with * indicate a potential emergency and should be followed up as soon as possible.  Feel free to call the clinic you have any questions or concerns. The clinic phone number is [phone].    

## 2014-06-13 NOTE — Telephone Encounter (Signed)
Pt confirmed labs/ov per 08/11 POF, gave pt AVS...KJ °

## 2014-07-05 ENCOUNTER — Telehealth: Payer: Self-pay | Admitting: General Practice

## 2014-07-05 NOTE — Telephone Encounter (Signed)
lmovm to call back and as a new pt with [facility]

## 2014-07-11 ENCOUNTER — Encounter: Payer: Self-pay | Admitting: Hematology and Oncology

## 2014-07-11 ENCOUNTER — Ambulatory Visit (HOSPITAL_BASED_OUTPATIENT_CLINIC_OR_DEPARTMENT_OTHER): Payer: Medicare Other

## 2014-07-11 ENCOUNTER — Other Ambulatory Visit (HOSPITAL_BASED_OUTPATIENT_CLINIC_OR_DEPARTMENT_OTHER): Payer: Medicare Other

## 2014-07-11 ENCOUNTER — Telehealth: Payer: Self-pay | Admitting: Hematology and Oncology

## 2014-07-11 ENCOUNTER — Other Ambulatory Visit: Payer: Self-pay | Admitting: Hematology and Oncology

## 2014-07-11 ENCOUNTER — Ambulatory Visit (HOSPITAL_BASED_OUTPATIENT_CLINIC_OR_DEPARTMENT_OTHER): Payer: Medicare Other | Admitting: Hematology and Oncology

## 2014-07-11 VITALS — BP 132/53 | HR 63 | Temp 97.9°F | Resp 18 | Ht 62.0 in | Wt 135.4 lb

## 2014-07-11 VITALS — BP 132/68 | HR 59 | Temp 97.9°F | Resp 18

## 2014-07-11 DIAGNOSIS — C8584 Other specified types of non-Hodgkin lymphoma, lymph nodes of axilla and upper limb: Secondary | ICD-10-CM

## 2014-07-11 DIAGNOSIS — Z5112 Encounter for antineoplastic immunotherapy: Secondary | ICD-10-CM

## 2014-07-11 DIAGNOSIS — C50919 Malignant neoplasm of unspecified site of unspecified female breast: Secondary | ICD-10-CM

## 2014-07-11 DIAGNOSIS — C8300 Small cell B-cell lymphoma, unspecified site: Secondary | ICD-10-CM

## 2014-07-11 DIAGNOSIS — Z853 Personal history of malignant neoplasm of breast: Secondary | ICD-10-CM

## 2014-07-11 DIAGNOSIS — C8590 Non-Hodgkin lymphoma, unspecified, unspecified site: Secondary | ICD-10-CM

## 2014-07-11 DIAGNOSIS — R0789 Other chest pain: Secondary | ICD-10-CM

## 2014-07-11 DIAGNOSIS — D630 Anemia in neoplastic disease: Secondary | ICD-10-CM

## 2014-07-11 LAB — COMPREHENSIVE METABOLIC PANEL (CC13)
ALT: 13 U/L (ref 0–55)
AST: 21 U/L (ref 5–34)
Albumin: 3.7 g/dL (ref 3.5–5.0)
Alkaline Phosphatase: 89 U/L (ref 40–150)
Anion Gap: 12 mEq/L — ABNORMAL HIGH (ref 3–11)
BUN: 11.4 mg/dL (ref 7.0–26.0)
CALCIUM: 10.4 mg/dL (ref 8.4–10.4)
CO2: 23 meq/L (ref 22–29)
Chloride: 106 mEq/L (ref 98–109)
Creatinine: 0.8 mg/dL (ref 0.6–1.1)
Glucose: 121 mg/dl (ref 70–140)
Potassium: 3.4 mEq/L — ABNORMAL LOW (ref 3.5–5.1)
SODIUM: 141 meq/L (ref 136–145)
TOTAL PROTEIN: 6.9 g/dL (ref 6.4–8.3)
Total Bilirubin: 0.27 mg/dL (ref 0.20–1.20)

## 2014-07-11 LAB — CBC WITH DIFFERENTIAL/PLATELET
BASO%: 0.2 % (ref 0.0–2.0)
Basophils Absolute: 0 10*3/uL (ref 0.0–0.1)
EOS%: 9.8 % — ABNORMAL HIGH (ref 0.0–7.0)
Eosinophils Absolute: 0.4 10*3/uL (ref 0.0–0.5)
HCT: 35.8 % (ref 34.8–46.6)
HGB: 11.1 g/dL — ABNORMAL LOW (ref 11.6–15.9)
LYMPH%: 8.4 % — ABNORMAL LOW (ref 14.0–49.7)
MCH: 27.1 pg (ref 25.1–34.0)
MCHC: 31 g/dL — ABNORMAL LOW (ref 31.5–36.0)
MCV: 87.3 fL (ref 79.5–101.0)
MONO#: 0.5 10*3/uL (ref 0.1–0.9)
MONO%: 12.4 % (ref 0.0–14.0)
NEUT#: 2.9 10*3/uL (ref 1.5–6.5)
NEUT%: 69.2 % (ref 38.4–76.8)
Platelets: 227 10*3/uL (ref 145–400)
RBC: 4.1 10*6/uL (ref 3.70–5.45)
RDW: 15.6 % — AB (ref 11.2–14.5)
WBC: 4.2 10*3/uL (ref 3.9–10.3)
lymph#: 0.4 10*3/uL — ABNORMAL LOW (ref 0.9–3.3)

## 2014-07-11 LAB — URIC ACID (CC13): Uric Acid, Serum: 6.1 mg/dl (ref 2.6–7.4)

## 2014-07-11 LAB — LACTATE DEHYDROGENASE (CC13): LDH: 360 U/L — ABNORMAL HIGH (ref 125–245)

## 2014-07-11 MED ORDER — DEXAMETHASONE SODIUM PHOSPHATE 20 MG/5ML IJ SOLN
INTRAMUSCULAR | Status: AC
  Filled 2014-07-11: qty 5

## 2014-07-11 MED ORDER — ACETAMINOPHEN 325 MG PO TABS
ORAL_TABLET | ORAL | Status: AC
  Filled 2014-07-11: qty 2

## 2014-07-11 MED ORDER — DEXAMETHASONE SODIUM PHOSPHATE 20 MG/5ML IJ SOLN
20.0000 mg | Freq: Once | INTRAMUSCULAR | Status: AC
Start: 2014-07-11 — End: 2014-07-11
  Administered 2014-07-11: 20 mg via INTRAVENOUS

## 2014-07-11 MED ORDER — DIPHENHYDRAMINE HCL 50 MG/ML IJ SOLN
INTRAMUSCULAR | Status: AC
  Filled 2014-07-11: qty 1

## 2014-07-11 MED ORDER — OBINUTUZUMAB CHEMO INJECTION 1000 MG/40ML
1000.0000 mg | Freq: Once | INTRAVENOUS | Status: AC
  Administered 2014-07-11: 1000 mg via INTRAVENOUS
  Filled 2014-07-11: qty 40

## 2014-07-11 MED ORDER — SODIUM CHLORIDE 0.9 % IV SOLN
Freq: Once | INTRAVENOUS | Status: AC
  Administered 2014-07-11: 10:00:00 via INTRAVENOUS

## 2014-07-11 MED ORDER — HEPARIN SOD (PORK) LOCK FLUSH 100 UNIT/ML IV SOLN
500.0000 [IU] | Freq: Once | INTRAVENOUS | Status: AC | PRN
  Administered 2014-07-11: 500 [IU]
  Filled 2014-07-11: qty 5

## 2014-07-11 MED ORDER — ACETAMINOPHEN 325 MG PO TABS
650.0000 mg | ORAL_TABLET | Freq: Once | ORAL | Status: AC
  Administered 2014-07-11: 650 mg via ORAL

## 2014-07-11 MED ORDER — DIPHENHYDRAMINE HCL 50 MG/ML IJ SOLN
50.0000 mg | Freq: Once | INTRAMUSCULAR | Status: AC
  Administered 2014-07-11: 50 mg via INTRAVENOUS

## 2014-07-11 MED ORDER — SODIUM CHLORIDE 0.9 % IJ SOLN
10.0000 mL | INTRAMUSCULAR | Status: DC | PRN
  Administered 2014-07-11: 10 mL
  Filled 2014-07-11: qty 10

## 2014-07-11 NOTE — Patient Instructions (Signed)
[place] Discharge Instructions for Patients Receiving Chemotherapy  Today you received the following chemotherapy agents Gayza  To help prevent nausea and vomiting after your treatment, we encourage you to take your nausea medication Zofran   If you develop nausea and vomiting that is not controlled by your nausea medication, call the clinic.   BELOW ARE SYMPTOMS THAT SHOULD BE REPORTED IMMEDIATELY:  *FEVER GREATER THAN 100.5 F  *CHILLS WITH OR WITHOUT FEVER  NAUSEA AND VOMITING THAT IS NOT CONTROLLED WITH YOUR NAUSEA MEDICATION  *UNUSUAL SHORTNESS OF BREATH  *UNUSUAL BRUISING OR BLEEDING  TENDERNESS IN MOUTH AND THROAT WITH OR WITHOUT PRESENCE OF ULCERS  *URINARY PROBLEMS  *BOWEL PROBLEMS  UNUSUAL RASH Items with * indicate a potential emergency and should be followed up as soon as possible.  Feel free to call the clinic you have any questions or concerns. The clinic phone number is [phone].

## 2014-07-11 NOTE — Progress Notes (Signed)
[facility] OFF[facility] PROGRESS NOTE  Patient Care Team: No Pcp Per Patient as PCP - General (General Practice) [name], MD as Consulting Physician (Hematology and Oncology)  SUMMARY OF ONCOLOGIC HISTORY: #1 left breast carcinoma originally presenting as a fungating mass and was diagnosed with metastatic breast cancer in October 2009. Patient underwent neoadjuvant chemotherapy followed by mastectomy. She is currently on Arimidex 1mg  daily and monthly Xgeva. 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 She received 6 cycles of CVP and Rituxan, followed by maintenance Rituxan. Later, her disease recurred and underwent 4 cycles of R-Bendamustine. PET/CT showed CR and then she proceeded to maintenance Rituxan.  #3 In April 2015, repeat CT scan show new lymphadenopathy. #4 on 04/03/2014, axillary lymph node dissection showed recurrence of CLL/SLL #5 On 04/18/14: She was started on Obinutuzumab  INTERVAL HISTORY: Please see below for problem oriented charting. She is seen prior to cycle 4 of treatment. She tolerated treatment well without side effects of allergic reaction. She has persistent chest wall tenderness and arm tenderness from prior surgery.  REVIEW OF SYSTEMS:   Constitutional: Denies fevers, chills or abnormal weight loss Eyes: Denies blurriness of vision Ears, nose, mouth, throat, and face: Denies mucositis or sore throat Respiratory: Denies cough, dyspnea or wheezes Cardiovascular: Denies palpitation, chest discomfort or lower extremity swelling Gastrointestinal:  Denies nausea, heartburn or change in bowel habits Skin: Denies abnormal skin rashes Lymphatics: Denies new lymphadenopathy or easy bruising Neurological:Denies numbness, tingling or new weaknesses Behavioral/Psych: Mood is stable, no new changes  All other systems were reviewed with the patient and are  negative.  I have reviewed the past medical history, past surgical history, social history and family history with the patient and they are unchanged from previous note.  ALLERGIES:  is allergic to codeine; penicillins; sulfonamide derivatives; tape; and tramadol hcl.  MEDICATIONS:  Current Outpatient Prescriptions  Medication Sig Dispense Refill  . anastrozole (ARIMIDEX) 1 MG tablet Take 1 tablet (1 mg total) by mouth daily.  30 tablet  5  . chlorambucil (LEUKERAN) 2 MG tablet Give on an empty stomach 1 hour before or 2 hours after meals. Take 2 tablets on the day of chemo treatment every other week  20 tablet  0  . gatifloxacin (ZYMAXID) 0.5 % SOLN Place 1 drop into both eyes 4 (four) times daily.      [name] HYDROcodone-acetaminophen (NORCO) 5-325 MG per tablet Take 1 tablet by mouth every 6 (six) hours as needed for moderate pain.  60 tablet  0  . prednisoLONE acetate (PRED FORTE) 1 % ophthalmic suspension Place 1 drop into both eyes 4 (four) times daily.      [name] acyclovir (ZOVIRAX) 400 MG tablet Take 1 tablet (400 mg total) by mouth daily.  30 tablet  6  . allopurinol (ZYLOPRIM) 300 MG tablet Take 1 tablet (300 mg total) by mouth daily.  30 tablet  1   No current facility-administered medications for this visit.    PHYSICAL EXAMINATION: ECOG PERFORMANCE STATUS: 0 - Asymptomatic  Filed Vitals:   07/11/14 0832  BP: 132/53  Pulse: 63  Temp: 97.9 F (36.6 C)  Resp: 18   Filed Weights   07/11/14 0832  Weight: 135 lb 7 oz (61.434 kg)    GENERAL:alert, no distress and comfortable SKIN: skin color, texture, turgor are normal, no rashes or significant lesions EYES: normal, Conjunctiva are pink and non-injected, sclera clear OROPHARYNX:no exudate, no erythema  and lips, buccal mucosa, and tongue normal  NECK: supple, thyroid normal size, non-tender, without nodularity LYMPH:  no palpable lymphadenopathy in the cervical, axillary or inguinal. Persistent tenderness at prior biopsy  site LUNGS: clear to auscultation and percussion with normal breathing effort HEART: regular rate & rhythm and no murmurs and no lower extremity edema ABDOMEN:abdomen soft, non-tender and normal bowel sounds Musculoskeletal:no cyanosis of digits and no clubbing  NEURO: alert & oriented x 3 with fluent speech, no focal motor/sensory deficits  LABORATORY DATA:  I have reviewed the data as listed    Component Value Date/Time   NA 141 07/11/2014 0811   NA 140 06/13/2014 0819   NA 142 06/01/2012 1008   K 3.4* 07/11/2014 0811   K 4.0 06/13/2014 0819   K 3.8 06/01/2012 1008   CL 101 06/13/2014 0819   CL 107 04/22/2013 0922   CL 103 06/01/2012 1008   CO2 23 07/11/2014 0811   CO2 25 06/13/2014 0819   CO2 25 06/01/2012 1008   GLUCOSE 121 07/11/2014 0811   GLUCOSE 98 06/13/2014 0819   GLUCOSE 89 04/22/2013 0922   GLUCOSE 108 06/01/2012 1008   BUN 11.4 07/11/2014 0811   BUN 13 06/13/2014 0819   BUN 9 06/01/2012 1008   CREATININE 0.8 07/11/2014 0811   CREATININE 0.81 06/13/2014 0819   CREATININE 0.9 06/01/2012 1008   CALCIUM 10.4 07/11/2014 0811   CALCIUM 10.8* 06/13/2014 0819   CALCIUM 9.8 06/01/2012 1008   PROT 6.9 07/11/2014 0811   PROT 7.5 06/13/2014 0819   PROT 8.1 08/14/2010 1251   ALBUMIN 3.7 07/11/2014 0811   ALBUMIN 3.9 06/13/2014 0819   AST 21 07/11/2014 0811   AST 23 06/13/2014 0819   AST 19 08/14/2010 1251   ALT 13 07/11/2014 0811   ALT 13 06/13/2014 0819   ALT 9* 08/14/2010 1251   ALKPHOS 89 07/11/2014 0811   ALKPHOS 89 06/13/2014 0819   ALKPHOS 78 08/14/2010 1251   BILITOT 0.27 07/11/2014 0811   BILITOT 0.2* 06/13/2014 0819   BILITOT 0.40 08/14/2010 1251   GFRNONAA 87* 07/06/2012 1122   GFRAA >90 07/06/2012 1122    No results found for this basename: SPEP,  UPEP,   kappa and lambda light chains    Lab Results  Component Value Date   WBC 4.2 07/11/2014   NEUTROABS 2.9 07/11/2014   HGB 11.1* 07/11/2014   HCT 35.8 07/11/2014   MCV 87.3 07/11/2014   PLT 227 07/11/2014      Chemistry      Component Value Date/Time    NA 141 07/11/2014 0811   NA 140 06/13/2014 0819   NA 142 06/01/2012 1008   K 3.4* 07/11/2014 0811   K 4.0 06/13/2014 0819   K 3.8 06/01/2012 1008   CL 101 06/13/2014 0819   CL 107 04/22/2013 0922   CL 103 06/01/2012 1008   CO2 23 07/11/2014 0811   CO2 25 06/13/2014 0819   CO2 25 06/01/2012 1008   BUN 11.4 07/11/2014 0811   BUN 13 06/13/2014 0819   BUN 9 06/01/2012 1008   CREATININE 0.8 07/11/2014 0811   CREATININE 0.81 06/13/2014 0819   CREATININE 0.9 06/01/2012 1008      Component Value Date/Time   CALCIUM 10.4 07/11/2014 0811   CALCIUM 10.8* 06/13/2014 0819   CALCIUM 9.8 06/01/2012 1008   ALKPHOS 89 07/11/2014 0811   ALKPHOS 89 06/13/2014 0819   ALKPHOS 78 08/14/2010 1251   AST 21 07/11/2014 0811   AST  23 06/13/2014 0819   AST 19 08/14/2010 1251   ALT 13 07/11/2014 0811   ALT 13 06/13/2014 0819   ALT 9* 08/14/2010 1251   BILITOT 0.27 07/11/2014 0811   BILITOT 0.2* 06/13/2014 0819   BILITOT 0.40 08/14/2010 1251      ASSESSMENT & PLAN:  Lymphoma, small lymphocytic She tolerated chemotherapy well without side effects. I reinforced the importance of acyclovir for antimicrobial prophylaxis. She will proceed with cycle 4 treatment today and on a monthly basis and she will only take chlorambucil on the days of her chemotherapy. I will proceed to order a CT scan of the chest, abdomen and pelvis before her next return visit to assess response to treatment. Clinically, her previously palpable lymph node is no longer present.     Breast CA She has no evidence of active metastatic disease. She will continue on Arimidex indefinitely.        Anemia in neoplastic disease This is likely anemia of chronic disease. The patient denies recent history of bleeding such as epistaxis, hematuria or hematochezia. She is asymptomatic from the anemia. We will observe for now.  She does not require transfusion now.        Chest wall tenderness This is postoperative in nature. She will continue pain medicine as  needed.   We discussed the importance of preventive care and reviewed the vaccination programs. She does not have any prior allergic reactions to influenza vaccination. She declined to proceed with influenza vaccination today.   Orders Placed This Encounter  Procedures  . CT Chest W Contrast    Standing Status: Future     Number of Occurrences:      Standing Expiration Date: 09/10/2015    Order Specific Question:  Reason for Exam (SYMPTOM  OR DIAGNOSIS REQUIRED)    Answer:  staging CLL assess repsonse to Rx    Order Specific Question:  Preferred imaging location?    Answer:  [facility]  . CT Abdomen Pelvis W Contrast    Standing Status: Future     Number of Occurrences:      Standing Expiration Date: 10/11/2015    Order Specific Question:  Reason for Exam (SYMPTOM  OR DIAGNOSIS REQUIRED)    Answer:  staging CLL assess repsonse to Rx    Order Specific Question:  Preferred imaging location?    Answer:  [facility]   All questions were answered. The patient knows to call the clinic with any problems, questions or concerns. No barriers to learning was detected. I spent 40 minutes counseling the patient face to face. The total time spent in the appointment was 55 minutes and more than 50% was on counseling and review of test results     [facility], [place], MD 07/11/2014 8:56 AM

## 2014-07-11 NOTE — Telephone Encounter (Signed)
Pt confirmed labs/ov per 09/08 POF, gave pt AVS...KJ °

## 2014-07-11 NOTE — Assessment & Plan Note (Signed)
This is postoperative in nature. She will continue pain medicine as needed.

## 2014-07-11 NOTE — Assessment & Plan Note (Signed)
This is likely anemia of chronic disease. The patient denies recent history of bleeding such as epistaxis, hematuria or hematochezia. She is asymptomatic from the anemia. We will observe for now.  She does not require transfusion now.   

## 2014-07-11 NOTE — Assessment & Plan Note (Signed)
She tolerated chemotherapy well without side effects. I reinforced the importance of acyclovir for antimicrobial prophylaxis. She will proceed with cycle 4 treatment today and on a monthly basis and she will only take chlorambucil on the days of her chemotherapy. I will proceed to order a CT scan of the chest, abdomen and pelvis before her next return visit to assess response to treatment. Clinically, her previously palpable lymph node is no longer present.

## 2014-07-11 NOTE — Assessment & Plan Note (Signed)
She has no evidence of active metastatic disease. She will continue on Arimidex indefinitely.

## 2014-08-04 ENCOUNTER — Telehealth: Payer: Self-pay | Admitting: *Deleted

## 2014-08-04 ENCOUNTER — Other Ambulatory Visit: Payer: Self-pay | Admitting: Hematology and Oncology

## 2014-08-04 ENCOUNTER — Ambulatory Visit (HOSPITAL_COMMUNITY)
Admission: RE | Admit: 2014-08-04 | Discharge: 2014-08-04 | Disposition: A | Discharge status: Home / Self Care | Payer: Medicare Other | Source: Ambulatory Visit | Attending: Hematology and Oncology | Admitting: Hematology and Oncology

## 2014-08-04 DIAGNOSIS — C8300 Small cell B-cell lymphoma, unspecified site: Secondary | ICD-10-CM | POA: Insufficient documentation

## 2014-08-04 DIAGNOSIS — C787 Secondary malignant neoplasm of liver and intrahepatic bile duct: Secondary | ICD-10-CM | POA: Insufficient documentation

## 2014-08-04 DIAGNOSIS — C7802 Secondary malignant neoplasm of left lung: Secondary | ICD-10-CM | POA: Diagnosis not present

## 2014-08-04 DIAGNOSIS — C7801 Secondary malignant neoplasm of right lung: Secondary | ICD-10-CM | POA: Insufficient documentation

## 2014-08-04 DIAGNOSIS — M899 Disorder of bone, unspecified: Secondary | ICD-10-CM | POA: Insufficient documentation

## 2014-08-04 DIAGNOSIS — C50919 Malignant neoplasm of unspecified site of unspecified female breast: Secondary | ICD-10-CM

## 2014-08-04 DIAGNOSIS — R590 Localized enlarged lymph nodes: Secondary | ICD-10-CM | POA: Insufficient documentation

## 2014-08-04 DIAGNOSIS — C8590 Non-Hodgkin lymphoma, unspecified, unspecified site: Secondary | ICD-10-CM | POA: Insufficient documentation

## 2014-08-04 DIAGNOSIS — Z853 Personal history of malignant neoplasm of breast: Secondary | ICD-10-CM | POA: Diagnosis not present

## 2014-08-04 DIAGNOSIS — C7951 Secondary malignant neoplasm of bone: Secondary | ICD-10-CM

## 2014-08-04 MED ORDER — IOHEXOL 300 MG/ML  SOLN
100.0000 mL | Freq: Once | INTRAMUSCULAR | Status: AC | PRN
  Administered 2014-08-04: 100 mL via INTRAVENOUS

## 2014-08-04 MED ORDER — IOHEXOL 300 MG/ML  SOLN
50.0000 mL | Freq: Once | INTRAMUSCULAR | Status: AC | PRN
  Administered 2014-08-04: 50 mL via ORAL

## 2014-08-04 NOTE — Telephone Encounter (Signed)
Informed pt of MRI ordered and scheduled for Friday 10/09.  Explained ordered by Dr. Alvy Bimler due to CT scan results showed some areas that need a closer look.  Dr. Alvy Bimler will explain further on office visit.  Pt verbalized understanding.

## 2014-08-08 ENCOUNTER — Other Ambulatory Visit (HOSPITAL_BASED_OUTPATIENT_CLINIC_OR_DEPARTMENT_OTHER): Payer: Medicare Other

## 2014-08-08 ENCOUNTER — Ambulatory Visit (HOSPITAL_BASED_OUTPATIENT_CLINIC_OR_DEPARTMENT_OTHER): Payer: Medicare Other | Admitting: Hematology and Oncology

## 2014-08-08 ENCOUNTER — Ambulatory Visit: Payer: Self-pay

## 2014-08-08 ENCOUNTER — Telehealth: Payer: Self-pay | Admitting: Hematology and Oncology

## 2014-08-08 VITALS — BP 135/58 | HR 59 | Temp 97.8°F | Resp 18 | Ht 62.0 in | Wt 136.8 lb

## 2014-08-08 DIAGNOSIS — C7800 Secondary malignant neoplasm of unspecified lung: Secondary | ICD-10-CM | POA: Insufficient documentation

## 2014-08-08 DIAGNOSIS — C787 Secondary malignant neoplasm of liver and intrahepatic bile duct: Secondary | ICD-10-CM

## 2014-08-08 DIAGNOSIS — C7802 Secondary malignant neoplasm of left lung: Secondary | ICD-10-CM

## 2014-08-08 DIAGNOSIS — C50912 Malignant neoplasm of unspecified site of left female breast: Secondary | ICD-10-CM

## 2014-08-08 DIAGNOSIS — D630 Anemia in neoplastic disease: Secondary | ICD-10-CM

## 2014-08-08 DIAGNOSIS — C8590 Non-Hodgkin lymphoma, unspecified, unspecified site: Secondary | ICD-10-CM

## 2014-08-08 DIAGNOSIS — C7951 Secondary malignant neoplasm of bone: Secondary | ICD-10-CM

## 2014-08-08 DIAGNOSIS — C8304 Small cell B-cell lymphoma, lymph nodes of axilla and upper limb: Secondary | ICD-10-CM

## 2014-08-08 DIAGNOSIS — C7801 Secondary malignant neoplasm of right lung: Secondary | ICD-10-CM

## 2014-08-08 DIAGNOSIS — C50919 Malignant neoplasm of unspecified site of unspecified female breast: Secondary | ICD-10-CM

## 2014-08-08 DIAGNOSIS — C8300 Small cell B-cell lymphoma, unspecified site: Secondary | ICD-10-CM

## 2014-08-08 DIAGNOSIS — K089 Disorder of teeth and supporting structures, unspecified: Secondary | ICD-10-CM | POA: Insufficient documentation

## 2014-08-08 LAB — CBC WITH DIFFERENTIAL/PLATELET
BASO%: 0.9 % (ref 0.0–2.0)
BASOS ABS: 0 10*3/uL (ref 0.0–0.1)
EOS%: 7.1 % — ABNORMAL HIGH (ref 0.0–7.0)
Eosinophils Absolute: 0.3 10*3/uL (ref 0.0–0.5)
HCT: 36.5 % (ref 34.8–46.6)
HEMOGLOBIN: 11.4 g/dL — AB (ref 11.6–15.9)
LYMPH#: 0.3 10*3/uL — AB (ref 0.9–3.3)
LYMPH%: 7.1 % — ABNORMAL LOW (ref 14.0–49.7)
MCH: 27.1 pg (ref 25.1–34.0)
MCHC: 31.3 g/dL — ABNORMAL LOW (ref 31.5–36.0)
MCV: 86.5 fL (ref 79.5–101.0)
MONO#: 0.8 10*3/uL (ref 0.1–0.9)
MONO%: 17.7 % — ABNORMAL HIGH (ref 0.0–14.0)
NEUT#: 3 10*3/uL (ref 1.5–6.5)
NEUT%: 67.2 % (ref 38.4–76.8)
Platelets: 177 10*3/uL (ref 145–400)
RBC: 4.23 10*6/uL (ref 3.70–5.45)
RDW: 15.7 % — AB (ref 11.2–14.5)
WBC: 4.5 10*3/uL (ref 3.9–10.3)

## 2014-08-08 LAB — COMPREHENSIVE METABOLIC PANEL (CC13)
ALT: 14 U/L (ref 0–55)
AST: 23 U/L (ref 5–34)
Albumin: 4.1 g/dL (ref 3.5–5.0)
Alkaline Phosphatase: 86 U/L (ref 40–150)
Anion Gap: 8 mEq/L (ref 3–11)
BILIRUBIN TOTAL: 0.32 mg/dL (ref 0.20–1.20)
BUN: 12.9 mg/dL (ref 7.0–26.0)
CO2: 26 mEq/L (ref 22–29)
Calcium: 10.7 mg/dL — ABNORMAL HIGH (ref 8.4–10.4)
Chloride: 106 mEq/L (ref 98–109)
Creatinine: 0.8 mg/dL (ref 0.6–1.1)
GLUCOSE: 80 mg/dL (ref 70–140)
Potassium: 4 mEq/L (ref 3.5–5.1)
Sodium: 140 mEq/L (ref 136–145)
Total Protein: 7.4 g/dL (ref 6.4–8.3)

## 2014-08-08 LAB — TECHNOLOGIST REVIEW

## 2014-08-08 LAB — LACTATE DEHYDROGENASE (CC13): LDH: 387 U/L — AB (ref 125–245)

## 2014-08-08 LAB — URIC ACID (CC13): Uric Acid, Serum: 5.5 mg/dl (ref 2.6–7.4)

## 2014-08-08 LAB — CANCER ANTIGEN 27.29: CA 27.29: 229 U/mL — ABNORMAL HIGH (ref 0–39)

## 2014-08-08 LAB — CANCER ANTIGEN 15-3: CANCER ANTIGEN-BREAST 15-3: 159 U/mL — AB (ref <32)

## 2014-08-08 NOTE — Telephone Encounter (Signed)
gv and printed appt sched and avs for pt for OCT. °

## 2014-08-08 NOTE — Assessment & Plan Note (Signed)
She had received prior treatment with bisphosphonates therapy. I recommend she makes another appointment with the dentist. I would like to prescribe Delton See if possible in the near future.

## 2014-08-08 NOTE — Assessment & Plan Note (Signed)
CLL appears to have responded to treatment. Continue observation.

## 2014-08-08 NOTE — Assessment & Plan Note (Signed)
I recommend dental evaluation. She would be at risk of osteonecrosis of the jaw.

## 2014-08-08 NOTE — Assessment & Plan Note (Signed)
She is not symptomatic. This is likely due to recurrence of breast cancer

## 2014-08-08 NOTE — Assessment & Plan Note (Signed)
She is relatively asymptomatic despite progression seen on her scan. I will order MRI of her back to be done this week to exclude progression of her bony metastasis on her back. Her tumor markers are very high. I would advocate using of Faslodex rather than starting her on chemotherapy due to low burden of disease. The patient also appears to have poor understanding of her disease status. I will review test results with her next week.

## 2014-08-08 NOTE — Assessment & Plan Note (Signed)
I told her I am suspicious this is originating from breast cancer. We discussed the risk and benefit of liver biopsy and she will think about it.

## 2014-08-08 NOTE — Assessment & Plan Note (Signed)
This is likely anemia of chronic disease. The patient denies recent history of bleeding such as epistaxis, hematuria or hematochezia. She is asymptomatic from the anemia. We will observe for now.  She does not require transfusion now.   

## 2014-08-08 NOTE — Progress Notes (Signed)
[place] OFFICE PROGRESS NOTE  Patient Care Team: No Pcp Per Patient as PCP - General (General Practice) [name], MD as Consulting Physician (Hematology and Oncology)  SUMMARY OF ONCOLOGIC HISTORY: #1 left breast carcinoma originally presenting as a fungating mass and was diagnosed with metastatic breast cancer in October 2009. Patient underwent neoadjuvant chemotherapy followed by mastectomy. She is currently on Arimidex 1mg  daily and monthly Xgeva. 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 She received 6 cycles of CVP and Rituxan, followed by maintenance Rituxan. Later, her disease recurred and underwent 4 cycles of R-Bendamustine. PET/CT showed CR and then she proceeded to maintenance Rituxan.  #3 In April 2015, repeat CT scan show new lymphadenopathy. #4 on 04/03/2014, axillary lymph node dissection showed recurrence of CLL/SLL #5 On 04/18/14: She was started on Obinutuzumab #6 on 08/04/2014, repeat CT scan showed improvement of disease contour of the right axilla but with progression of liver metastasis & lung metastasis INTERVAL HISTORY: Please see below for problem oriented charting. She complained of mild back pain which is chronic in nature. Denies new neurological deficits. She denies any recent abnormal breast examination, palpable mass, abnormal breast appearance or nipple changes   REVIEW OF SYSTEMS:   Constitutional: Denies fevers, chills or abnormal weight loss Eyes: Denies blurriness of vision Ears, nose, mouth, throat, and face: Denies mucositis or sore throat Respiratory: Denies cough, dyspnea or wheezes Cardiovascular: Denies palpitation, chest discomfort or lower extremity swelling Gastrointestinal:  Denies nausea, heartburn or change in bowel habits Skin: Denies abnormal skin rashes Lymphatics: Denies new lymphadenopathy or easy  bruising Neurological:Denies numbness, tingling or new weaknesses Behavioral/Psych: Mood is stable, no new changes  All other systems were reviewed with the patient and are negative.  I have reviewed the past medical history, past surgical history, social history and family history with the patient and they are unchanged from previous note.  ALLERGIES:  is allergic to codeine; penicillins; sulfonamide derivatives; tape; and tramadol hcl.  MEDICATIONS:  Current Outpatient Prescriptions  Medication Sig Dispense Refill  . acyclovir (ZOVIRAX) 400 MG tablet Take 1 tablet (400 mg total) by mouth daily.  30 tablet  6  . allopurinol (ZYLOPRIM) 300 MG tablet Take 1 tablet (300 mg total) by mouth daily.  30 tablet  1  . anastrozole (ARIMIDEX) 1 MG tablet Take 1 tablet (1 mg total) by mouth daily.  30 tablet  5  . chlorambucil (LEUKERAN) 2 MG tablet Give on an empty stomach 1 hour before or 2 hours after meals. Take 2 tablets on the day of chemo treatment every other week  20 tablet  0  . gatifloxacin (ZYMAXID) 0.5 % SOLN Place 1 drop into both eyes 4 (four) times daily.      [name] HYDROcodone-acetaminophen (NORCO) 5-325 MG per tablet Take 1 tablet by mouth every 6 (six) hours as needed for moderate pain.  60 tablet  0  . prednisoLONE acetate (PRED FORTE) 1 % ophthalmic suspension Place 1 drop into both eyes 4 (four) times daily.       No current facility-administered medications for this visit.    PHYSICAL EXAMINATION: ECOG PERFORMANCE STATUS: 0 - Asymptomatic  Filed Vitals:   08/08/14 0911  BP: 135/58  Pulse: 59  Temp: 97.8 F (36.6 C)  Resp: 18   Filed Weights   08/08/14 0911  Weight: 136 lb 12.8 oz (62.052 kg)    GENERAL:alert, no distress and comfortable SKIN: skin  color, texture, turgor are normal, no rashes or significant lesions EYES: normal, Conjunctiva are pink and non-injected, sclera clear OROPHARYNX:no exudate, no erythema and lips, buccal mucosa, and tongue normal  NECK:  supple, thyroid normal size, non-tender, without nodularity LYMPH:  no palpable lymphadenopathy in the cervical, axillary or inguinal LUNGS: clear to auscultation and percussion with normal breathing effort HEART: regular rate & rhythm and no murmurs and no lower extremity edema ABDOMEN:abdomen soft, non-tender and normal bowel sounds Musculoskeletal:no cyanosis of digits and no clubbing  NEURO: alert & oriented x 3 with fluent speech, no focal motor/sensory deficits Well-healed mastectomy scar on the left with no palpable abnormalities LABORATORY DATA:  I have reviewed the data as listed    Component Value Date/Time   NA 140 08/08/2014 0853   NA 140 06/13/2014 0819   NA 142 06/01/2012 1008   K 4.0 08/08/2014 0853   K 4.0 06/13/2014 0819   K 3.8 06/01/2012 1008   CL 101 06/13/2014 0819   CL 107 04/22/2013 0922   CL 103 06/01/2012 1008   CO2 26 08/08/2014 0853   CO2 25 06/13/2014 0819   CO2 25 06/01/2012 1008   GLUCOSE 80 08/08/2014 0853   GLUCOSE 98 06/13/2014 0819   GLUCOSE 89 04/22/2013 0922   GLUCOSE 108 06/01/2012 1008   BUN 12.9 08/08/2014 0853   BUN 13 06/13/2014 0819   BUN 9 06/01/2012 1008   CREATININE 0.8 08/08/2014 0853   CREATININE 0.81 06/13/2014 0819   CREATININE 0.9 06/01/2012 1008   CALCIUM 10.7* 08/08/2014 0853   CALCIUM 10.8* 06/13/2014 0819   CALCIUM 9.8 06/01/2012 1008   PROT 7.4 08/08/2014 0853   PROT 7.5 06/13/2014 0819   PROT 8.1 08/14/2010 1251   ALBUMIN 4.1 08/08/2014 0853   ALBUMIN 3.9 06/13/2014 0819   AST 23 08/08/2014 0853   AST 23 06/13/2014 0819   AST 19 08/14/2010 1251   ALT 14 08/08/2014 0853   ALT 13 06/13/2014 0819   ALT 9* 08/14/2010 1251   ALKPHOS 86 08/08/2014 0853   ALKPHOS 89 06/13/2014 0819   ALKPHOS 78 08/14/2010 1251   BILITOT 0.32 08/08/2014 0853   BILITOT 0.2* 06/13/2014 0819   BILITOT 0.40 08/14/2010 1251   GFRNONAA 87* 07/06/2012 1122   GFRAA >90 07/06/2012 1122    No results found for this basename: SPEP,  UPEP,   kappa and lambda light chains    Lab  Results  Component Value Date   WBC 4.5 08/08/2014   NEUTROABS 3.0 08/08/2014   HGB 11.4* 08/08/2014   HCT 36.5 08/08/2014   MCV 86.5 08/08/2014   PLT 177 08/08/2014      Chemistry      Component Value Date/Time   NA 140 08/08/2014 0853   NA 140 06/13/2014 0819   NA 142 06/01/2012 1008   K 4.0 08/08/2014 0853   K 4.0 06/13/2014 0819   K 3.8 06/01/2012 1008   CL 101 06/13/2014 0819   CL 107 04/22/2013 0922   CL 103 06/01/2012 1008   CO2 26 08/08/2014 0853   CO2 25 06/13/2014 0819   CO2 25 06/01/2012 1008   BUN 12.9 08/08/2014 0853   BUN 13 06/13/2014 0819   BUN 9 06/01/2012 1008   CREATININE 0.8 08/08/2014 0853   CREATININE 0.81 06/13/2014 0819   CREATININE 0.9 06/01/2012 1008      Component Value Date/Time   CALCIUM 10.7* 08/08/2014 0853   CALCIUM 10.8* 06/13/2014 0819   CALCIUM 9.8 06/01/2012 1008  ALKPHOS 86 08/08/2014 0853   ALKPHOS 89 06/13/2014 0819   ALKPHOS 78 08/14/2010 1251   AST 23 08/08/2014 0853   AST 23 06/13/2014 0819   AST 19 08/14/2010 1251   ALT 14 08/08/2014 0853   ALT 13 06/13/2014 0819   ALT 9* 08/14/2010 1251   BILITOT 0.32 08/08/2014 0853   BILITOT 0.2* 06/13/2014 0819   BILITOT 0.40 08/14/2010 1251       RADIOGRAPHIC STUDIES: I reviewed the CT scan with the patient I have personally reviewed the radiological images as listed and agreed with the findings in the report.  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 Continue observation.  Cancer of left breast She is relatively asymptomatic despite progression seen on her scan. I will order MRI of her back to be done this week to exclude progression of her bony metastasis on her back. Her tumor markers are very high. I would advocate using of Faslodex rather than starting her on chemotherapy due to low burden of disease. The patient also appears to have poor understanding of her disease status. I will review test results with her next week.  Metastasis to bone She  had received prior treatment with bisphosphonates therapy. I recommend she makes another appointment with the dentist. I would like to prescribe Delton See if possible in the near future.  Anemia in neoplastic disease This is likely anemia of chronic disease. The patient denies recent history of bleeding such as epistaxis, hematuria or hematochezia. She is asymptomatic from the anemia. We will observe for now.  She does not require transfusion now.     Poor dentition I recommend dental evaluation. She would be at risk of osteonecrosis of the jaw.  Pulmonary metastases She is not symptomatic. This is likely due to recurrence of breast cancer  Liver metastasis I told her I am suspicious this is originating from breast cancer. We discussed the risk and benefit of liver biopsy and she will think about it.    No orders of the defined types were placed in this encounter.   All questions were answered. The patient knows to call the clinic with any problems, questions or concerns. No barriers to learning was detected. I spent 30 minutes counseling the patient face to face. The total time spent in the appointment was 40 minutes and more than 50% was on counseling and review of test results     [facility], [place], MD 08/08/2014 9:50 PM

## 2014-08-11 ENCOUNTER — Ambulatory Visit (HOSPITAL_COMMUNITY)
Admission: RE | Admit: 2014-08-11 | Discharge: 2014-08-11 | Disposition: A | Discharge status: Home / Self Care | Payer: Medicare Other | Source: Ambulatory Visit | Attending: Diagnostic Radiology | Admitting: Diagnostic Radiology

## 2014-08-11 DIAGNOSIS — M4327 Fusion of spine, lumbosacral region: Secondary | ICD-10-CM | POA: Insufficient documentation

## 2014-08-11 DIAGNOSIS — Z79899 Other long term (current) drug therapy: Secondary | ICD-10-CM | POA: Diagnosis not present

## 2014-08-11 DIAGNOSIS — C7951 Secondary malignant neoplasm of bone: Secondary | ICD-10-CM | POA: Diagnosis not present

## 2014-08-11 DIAGNOSIS — M47816 Spondylosis without myelopathy or radiculopathy, lumbar region: Secondary | ICD-10-CM | POA: Insufficient documentation

## 2014-08-11 DIAGNOSIS — C8590 Non-Hodgkin lymphoma, unspecified, unspecified site: Secondary | ICD-10-CM | POA: Diagnosis not present

## 2014-08-11 DIAGNOSIS — C50919 Malignant neoplasm of unspecified site of unspecified female breast: Secondary | ICD-10-CM

## 2014-08-11 DIAGNOSIS — M4316 Spondylolisthesis, lumbar region: Secondary | ICD-10-CM | POA: Diagnosis not present

## 2014-08-11 DIAGNOSIS — Z853 Personal history of malignant neoplasm of breast: Secondary | ICD-10-CM | POA: Diagnosis not present

## 2014-08-11 DIAGNOSIS — C787 Secondary malignant neoplasm of liver and intrahepatic bile duct: Secondary | ICD-10-CM | POA: Diagnosis not present

## 2014-08-11 DIAGNOSIS — C7800 Secondary malignant neoplasm of unspecified lung: Secondary | ICD-10-CM | POA: Insufficient documentation

## 2014-08-11 MED ORDER — GADOBENATE DIMEGLUMINE 529 MG/ML IV SOLN
12.0000 mL | Freq: Once | INTRAVENOUS | Status: AC | PRN
  Administered 2014-08-11: 12 mL via INTRAVENOUS

## 2014-08-15 ENCOUNTER — Ambulatory Visit (HOSPITAL_BASED_OUTPATIENT_CLINIC_OR_DEPARTMENT_OTHER): Payer: Medicare Other

## 2014-08-15 ENCOUNTER — Ambulatory Visit (HOSPITAL_BASED_OUTPATIENT_CLINIC_OR_DEPARTMENT_OTHER): Payer: Medicare Other | Admitting: Hematology and Oncology

## 2014-08-15 ENCOUNTER — Encounter: Payer: Self-pay | Admitting: Hematology and Oncology

## 2014-08-15 ENCOUNTER — Telehealth: Payer: Self-pay | Admitting: Hematology and Oncology

## 2014-08-15 ENCOUNTER — Telehealth: Payer: Self-pay | Admitting: *Deleted

## 2014-08-15 VITALS — BP 132/54 | HR 69 | Temp 97.6°F | Resp 18 | Ht 62.0 in | Wt 137.1 lb

## 2014-08-15 DIAGNOSIS — C7800 Secondary malignant neoplasm of unspecified lung: Secondary | ICD-10-CM

## 2014-08-15 DIAGNOSIS — Z23 Encounter for immunization: Secondary | ICD-10-CM

## 2014-08-15 DIAGNOSIS — C50912 Malignant neoplasm of unspecified site of left female breast: Secondary | ICD-10-CM

## 2014-08-15 DIAGNOSIS — C7801 Secondary malignant neoplasm of right lung: Secondary | ICD-10-CM

## 2014-08-15 DIAGNOSIS — Z5111 Encounter for antineoplastic chemotherapy: Secondary | ICD-10-CM

## 2014-08-15 DIAGNOSIS — K088 Other specified disorders of teeth and supporting structures: Secondary | ICD-10-CM

## 2014-08-15 DIAGNOSIS — K089 Disorder of teeth and supporting structures, unspecified: Secondary | ICD-10-CM

## 2014-08-15 DIAGNOSIS — C787 Secondary malignant neoplasm of liver and intrahepatic bile duct: Secondary | ICD-10-CM

## 2014-08-15 DIAGNOSIS — C7951 Secondary malignant neoplasm of bone: Secondary | ICD-10-CM

## 2014-08-15 DIAGNOSIS — C8300 Small cell B-cell lymphoma, unspecified site: Secondary | ICD-10-CM

## 2014-08-15 MED ORDER — INFLUENZA VAC SPLIT QUAD 0.5 ML IM SUSY
0.5000 mL | PREFILLED_SYRINGE | Freq: Once | INTRAMUSCULAR | Status: AC
  Administered 2014-08-15: 0.5 mL via INTRAMUSCULAR
  Filled 2014-08-15: qty 0.5

## 2014-08-15 MED ORDER — FULVESTRANT 250 MG/5ML IM SOLN
500.0000 mg | INTRAMUSCULAR | Status: DC
  Administered 2014-08-15: 500 mg via INTRAMUSCULAR
  Filled 2014-08-15: qty 10

## 2014-08-15 NOTE — Telephone Encounter (Signed)
Received message to call pt's son, Babbie Dondlinger, this morning to update on pt's medical condition. His phone number [phone].   Informed him he is not listed on release of information form signed by pt..  Suggested he speak w/ pt and his siblings to get information.  He verbalized understanding. Pt came in for visit today and added Lynnae Sandhoff to her information list.

## 2014-08-15 NOTE — Assessment & Plan Note (Signed)
She is not symptomatic. This is likely due to recurrence of breast cancer

## 2014-08-15 NOTE — Assessment & Plan Note (Signed)
CLL appears to have responded to treatment. Continue observation.

## 2014-08-15 NOTE — Patient Instructions (Signed)
Fulvestrant injection  What is this medicine?  FULVESTRANT (ful VES trant) blocks the effects of estrogen. It is used to treat breast cancer in women past the age of menopause.  This medicine may be used for other purposes; ask your health care provider or pharmacist if you have questions.  COMMON BRAND NAME(S): FASLODEX  What should I tell my health care provider before I take this medicine?  They need to know if you have any of these conditions:  -bleeding problems  -liver disease  -low levels of platelets in the blood  -an unusual or allergic reaction to fulvestrant, other medicines, foods, dyes, or preservatives  -pregnant or trying to get pregnant  -breast-feeding  How should I use this medicine?  This medicine is for injection into a muscle. It is usually given by a health care professional in a hospital or clinic setting.  Talk to your pediatrician regarding the use of this medicine in children. Special care may be needed.  Overdosage: If you think you have taken too much of this medicine contact a poison control center or emergency room at once.  NOTE: This medicine is only for you. Do not share this medicine with others.  What if I miss a dose?  It is important not to miss your dose. Call your doctor or health care professional if you are unable to keep an appointment.  What may interact with this medicine?  -medicines that treat or prevent blood clots like warfarin, enoxaparin, and dalteparin  This list may not describe all possible interactions. Give your health care provider a list of all the medicines, herbs, non-prescription drugs, or dietary supplements you use. Also tell them if you smoke, drink alcohol, or use illegal drugs. Some items may interact with your medicine.  What should I watch for while using this medicine?  Your condition will be monitored carefully while you are receiving this medicine. You will need important blood work done while you are taking this medicine.  Do not become pregnant  while taking this medicine. Women should inform their doctor if they wish to become pregnant or think they might be pregnant. There is a potential for serious side effects to an unborn child. Talk to your health care professional or pharmacist for more information.  What side effects may I notice from receiving this medicine?  Side effects that you should report to your doctor or health care professional as soon as possible:  -allergic reactions like skin rash, itching or hives, swelling of the face, lips, or tongue  -feeling faint or lightheaded, falls  -fever or flu-like symptoms  -sore throat  -vaginal bleeding  Side effects that usually do not require medical attention (report to your doctor or health care professional if they continue or are bothersome):  -aches, pains  -constipation or diarrhea  -headache  -hot flashes  -nausea, vomiting  -pain at site where injected  -stomach pain  This list may not describe all possible side effects. Call your doctor for medical advice about side effects. You may report side effects to FDA at 1-800-FDA-1088.  Where should I keep my medicine?  This drug is given in a hospital or clinic and will not be stored at home.  NOTE: This sheet is a summary. It may not cover all possible information. If you have questions about this medicine, talk to your doctor, pharmacist, or health care provider.   2015, Elsevier/Gold Standard. (2008-02-28 15:39:24)

## 2014-08-15 NOTE — Assessment & Plan Note (Signed)
I discussed with her son the rationale of not pursuing biopsy. Both CA 15-3 and CA 27-29 were very high and the pattern of distribution of metastatic cancer is consistent with recurrence of her prior disease. I discussed with her and her son the rationale of not pursuing chemotherapy at this point and I would like to try and not a different hormonal manipulation before resorting to chemotherapy. I would recommend Faslodex treatment for her and we'll monitor her tumor marker closely.

## 2014-08-15 NOTE — Assessment & Plan Note (Signed)
I recommend dental evaluation. She would be at risk of osteonecrosis of the jaw.

## 2014-08-15 NOTE — Assessment & Plan Note (Signed)
Her MRI appears somewhat stable and she has not much symptoms apart from chronic arthritis. I recommend review with radiation oncologist to see if any additional palliative radiation therapy would be beneficial to reduce risk of fracture. She need to be started on XGeva the but had problems with dental issues in the past. I recommend dental evaluation as soon as possible.

## 2014-08-15 NOTE — Telephone Encounter (Signed)
Pt confirmed labs/ov per 10/13 POF, gave pt AVS.... KJ °

## 2014-08-15 NOTE — Progress Notes (Signed)
[facility] OFFICE PROGRESS NOTE  Patient Care Team: No Pcp Per Patient as PCP - General (General Practice) [name], MD as Consulting Physician (Hematology and Oncology)  SUMMARY OF ONCOLOGIC HISTORY: #1 left breast carcinoma originally presenting as a fungating mass and was diagnosed with metastatic breast cancer in October 2009. Patient underwent neoadjuvant chemotherapy followed by mastectomy. She is currently on Arimidex 1mg  daily and monthly Xgeva. 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 She received 6 cycles of CVP and Rituxan, followed by maintenance Rituxan. Later, her disease recurred and underwent 4 cycles of R-Bendamustine. PET/CT showed CR and then she proceeded to maintenance Rituxan.  #3 In April 2015, repeat CT scan show new lymphadenopathy. #4 on 04/03/2014, axillary lymph node dissection showed recurrence of CLL/SLL #5 On 04/18/14: She was started on Obinutuzumab #6 on 08/04/2014, repeat CT scan showed improvement of disease contour of the right axilla but with progression of liver metastasis & lung metastasis INTERVAL HISTORY: Please see below for problem oriented charting. She complained of chronic arthritis and back pain. Denies new neurological deficits. She had mild tooth ache.  REVIEW OF SYSTEMS:   Constitutional: Denies fevers, chills or abnormal weight loss Eyes: Denies blurriness of vision Ears, nose, mouth, throat, and face: Denies mucositis or sore throat Respiratory: Denies cough, dyspnea or wheezes Cardiovascular: Denies palpitation, chest discomfort or lower extremity swelling Gastrointestinal:  Denies nausea, heartburn or change in bowel habits Skin: Denies abnormal skin rashes Lymphatics: Denies new lymphadenopathy or easy bruising Neurological:Denies numbness, tingling or new weaknesses Behavioral/Psych: Mood is stable, no new changes  All  other systems were reviewed with the patient and are negative.  I have reviewed the past medical history, past surgical history, social history and family history with the patient and they are unchanged from previous note.  ALLERGIES:  is allergic to codeine; penicillins; sulfonamide derivatives; tape; and tramadol hcl.  MEDICATIONS:  Current Outpatient Prescriptions  Medication Sig Dispense Refill  . HYDROcodone-acetaminophen (NORCO) 5-325 MG per tablet Take 1 tablet by mouth every 6 (six) hours as needed for moderate pain.  60 tablet  0  . acyclovir (ZOVIRAX) 400 MG tablet Take 1 tablet (400 mg total) by mouth daily.  30 tablet  6  . allopurinol (ZYLOPRIM) 300 MG tablet Take 1 tablet (300 mg total) by mouth daily.  30 tablet  1  . anastrozole (ARIMIDEX) 1 MG tablet Take 1 tablet (1 mg total) by mouth daily.  30 tablet  5  . chlorambucil (LEUKERAN) 2 MG tablet Give on an empty stomach 1 hour before or 2 hours after meals. Take 2 tablets on the day of chemo treatment every other week  20 tablet  0  . gatifloxacin (ZYMAXID) 0.5 % SOLN Place 1 drop into both eyes 4 (four) times daily.      . prednisoLONE acetate (PRED FORTE) 1 % ophthalmic suspension Place 1 drop into both eyes 4 (four) times daily.       No current facility-administered medications for this visit.    PHYSICAL EXAMINATION: ECOG PERFORMANCE STATUS: 1 - Symptomatic but completely ambulatory  Filed Vitals:   08/15/14 0959  BP: 132/54  Pulse: 69  Temp: 97.6 F (36.4 C)  Resp: 18   Filed Weights   08/15/14 0959  Weight: 137 lb 1.6 oz (62.188 kg)    GENERAL:alert, no distress and comfortable. She looks thin but not cachectic SKIN: skin color, texture, turgor are normal,  no rashes or significant lesions EYES: normal, Conjunctiva are pink and non-injected, sclera clear OROPHARYNX:no exudate, no erythema and lips, buccal mucosa, and tongue normal  Musculoskeletal:no cyanosis of digits and no clubbing  NEURO: alert &  oriented x 3 with fluent speech, no focal motor/sensory deficits  LABORATORY DATA:  I have reviewed the data as listed    Component Value Date/Time   NA 140 08/08/2014 0853   NA 140 06/13/2014 0819   NA 142 06/01/2012 1008   K 4.0 08/08/2014 0853   K 4.0 06/13/2014 0819   K 3.8 06/01/2012 1008   CL 101 06/13/2014 0819   CL 107 04/22/2013 0922   CL 103 06/01/2012 1008   CO2 26 08/08/2014 0853   CO2 25 06/13/2014 0819   CO2 25 06/01/2012 1008   GLUCOSE 80 08/08/2014 0853   GLUCOSE 98 06/13/2014 0819   GLUCOSE 89 04/22/2013 0922   GLUCOSE 108 06/01/2012 1008   BUN 12.9 08/08/2014 0853   BUN 13 06/13/2014 0819   BUN 9 06/01/2012 1008   CREATININE 0.8 08/08/2014 0853   CREATININE 0.81 06/13/2014 0819   CREATININE 0.9 06/01/2012 1008   CALCIUM 10.7* 08/08/2014 0853   CALCIUM 10.8* 06/13/2014 0819   CALCIUM 9.8 06/01/2012 1008   PROT 7.4 08/08/2014 0853   PROT 7.5 06/13/2014 0819   PROT 8.1 08/14/2010 1251   ALBUMIN 4.1 08/08/2014 0853   ALBUMIN 3.9 06/13/2014 0819   AST 23 08/08/2014 0853   AST 23 06/13/2014 0819   AST 19 08/14/2010 1251   ALT 14 08/08/2014 0853   ALT 13 06/13/2014 0819   ALT 9* 08/14/2010 1251   ALKPHOS 86 08/08/2014 0853   ALKPHOS 89 06/13/2014 0819   ALKPHOS 78 08/14/2010 1251   BILITOT 0.32 08/08/2014 0853   BILITOT 0.2* 06/13/2014 0819   BILITOT 0.40 08/14/2010 1251   GFRNONAA 87* 07/06/2012 1122   GFRAA >90 07/06/2012 1122    No results found for this basename: SPEP,  UPEP,   kappa and lambda light chains    Lab Results  Component Value Date   WBC 4.5 08/08/2014   NEUTROABS 3.0 08/08/2014   HGB 11.4* 08/08/2014   HCT 36.5 08/08/2014   MCV 86.5 08/08/2014   PLT 177 08/08/2014      Chemistry      Component Value Date/Time   NA 140 08/08/2014 0853   NA 140 06/13/2014 0819   NA 142 06/01/2012 1008   K 4.0 08/08/2014 0853   K 4.0 06/13/2014 0819   K 3.8 06/01/2012 1008   CL 101 06/13/2014 0819   CL 107 04/22/2013 0922   CL 103 06/01/2012 1008   CO2 26 08/08/2014 0853   CO2 25  06/13/2014 0819   CO2 25 06/01/2012 1008   BUN 12.9 08/08/2014 0853   BUN 13 06/13/2014 0819   BUN 9 06/01/2012 1008   CREATININE 0.8 08/08/2014 0853   CREATININE 0.81 06/13/2014 0819   CREATININE 0.9 06/01/2012 1008      Component Value Date/Time   CALCIUM 10.7* 08/08/2014 0853   CALCIUM 10.8* 06/13/2014 0819   CALCIUM 9.8 06/01/2012 1008   ALKPHOS 86 08/08/2014 0853   ALKPHOS 89 06/13/2014 0819   ALKPHOS 78 08/14/2010 1251   AST 23 08/08/2014 0853   AST 23 06/13/2014 0819   AST 19 08/14/2010 1251   ALT 14 08/08/2014 0853   ALT 13 06/13/2014 0819   ALT 9* 08/14/2010 1251   BILITOT 0.32 08/08/2014 0853   BILITOT 0.2*  06/13/2014 0819   BILITOT 0.40 08/14/2010 1251       RADIOGRAPHIC STUDIES: I reviewed the CT scan and MRI with patient and son I have personally reviewed the radiological images as listed and agreed with the findings in the report.  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 Continue observation.  Metastasis to bone Her MRI appears somewhat stable and she has not much symptoms apart from chronic arthritis. I recommend review with radiation oncologist to see if any additional palliative radiation therapy would be beneficial to reduce risk of fracture. She need to be started on XGeva the but had problems with dental issues in the past. I recommend dental evaluation as soon as possible.  Cancer of left breast I discussed with her son the rationale of not pursuing biopsy. Both CA 15-3 and CA 27-29 were very high and the pattern of distribution of metastatic cancer is consistent with recurrence of her prior disease. I discussed with her and her son the rationale of not pursuing chemotherapy at this point and I would like to try and not a different hormonal manipulation before resorting to chemotherapy. I would recommend Faslodex treatment for her and we'll monitor her tumor marker closely.  Pulmonary metastases She is not  symptomatic. This is likely due to recurrence of breast cancer  Poor dentition I recommend dental evaluation. She would be at risk of osteonecrosis of the jaw.    Orders Placed This Encounter  Procedures  . Cancer antigen 27.29    Standing Status: Future     Number of Occurrences:      Standing Expiration Date: 09/19/2015   All questions were answered. The patient knows to call the clinic with any problems, questions or concerns. No barriers to learning was detected. I spent 40 minutes counseling the patient face to face. The total time spent in the appointment was 60 minutes and more than 50% was on counseling and review of test results     [facility], [place], MD 08/15/2014 2:26 PM

## 2014-08-17 ENCOUNTER — Telehealth: Payer: Self-pay | Admitting: *Deleted

## 2014-08-17 ENCOUNTER — Encounter: Payer: Self-pay | Admitting: Hematology and Oncology

## 2014-08-17 NOTE — Telephone Encounter (Signed)
Pt left VM asking for letter for her Dentist stating reason why Dr. Alvy Bimler wants her to be seen.  Letter completed.  Called pt and left VM asking pt for name and phone number of her dentist so I can fax the letter.

## 2014-08-17 NOTE — Progress Notes (Addendum)
Histology and Location of Primary Cancer:  ,Breast recurrence with  with Bone metastases  Sites of Visceral and Bony Metastatic Disease: T11,T12, mild L3 mets with early epidural extension on  the left  Location(s) of Symptomatic Metastases: chronic back pain   Past/Anticipated chemotherapy by medical oncology, if any:  Arimidex  Stopped, not working, ,  IT consultant to start? Dental reval ,  Dr. Alvy Bimler last note 08/15/14 ,  Hx CLL/SLL= 6 cycles of CVP and Rituxan 2012, followed by maintenance Rituxan,  Later recurrence disease,had 4 cycles of R-Bendamustine., proceeded maintenance Rituxan., April 2015=new lymphadenopathy,  On 04/03/14, axillary lymph node dissection =recurrence of CLL/SLL, 04/08/14 started on Obinutuzumab, 08/04/14 repeat Ct showed improvement of disease of right axilla but progression of liver and lung metastases 08/11/14 MR spine mets=T11,T12 metastases Had Fulvestrant 500 mg injection 08/15/14, to get q 14 days  Pain on a scale of 0-10 is: 0 right now    If Spine Met(s), symptoms, if any, include  Bowel/Bladder retention or incontinence :constipated , says bladder odor but is leveling off,   Numbness or weakness in extremities:numbnes fingers, hands, knees and feet     Current Decadron regimen, if applicable: No  Ambulatory status? Walker? Wheelchair?:has walker at home  SAFETY ISSUES:  Prior radiation? Yes, 07/19/12-07/30/12  L spine and right shoulder  Dr.Moody  Pacemaker/ICD? No  Possible current pregnancy? No  Is the patient on methotrexate? No  Current Complaints / other details:  Hx  non hodgkins lymphoma,stage IV, Left breast Cancer dx 2009,

## 2014-08-18 ENCOUNTER — Telehealth: Payer: Self-pay | Admitting: Nurse Practitioner

## 2014-08-18 NOTE — Telephone Encounter (Signed)
Patient left vm with Dr. Buelah Manis (dentist) phone number. This RN called and received fax number to send letter for dental eval/clearance for treatment. Patient informed that letter has been faxed appropriately. She thanks for call and verbalizes understanding.

## 2014-08-21 ENCOUNTER — Ambulatory Visit
Admission: RE | Admit: 2014-08-21 | Discharge: 2014-08-21 | Disposition: A | Discharge status: Home / Self Care | Payer: Medicare Other | Source: Ambulatory Visit | Attending: Radiation Oncology | Admitting: Radiation Oncology

## 2014-08-21 ENCOUNTER — Encounter: Payer: Self-pay | Admitting: Radiation Oncology

## 2014-08-21 VITALS — BP 122/67 | HR 65 | Temp 97.5°F | Resp 20 | Ht 62.0 in | Wt 138.7 lb

## 2014-08-21 DIAGNOSIS — Z510 Encounter for antineoplastic radiation therapy: Secondary | ICD-10-CM | POA: Insufficient documentation

## 2014-08-21 DIAGNOSIS — C8300 Small cell B-cell lymphoma, unspecified site: Secondary | ICD-10-CM | POA: Insufficient documentation

## 2014-08-21 DIAGNOSIS — C7951 Secondary malignant neoplasm of bone: Secondary | ICD-10-CM | POA: Diagnosis not present

## 2014-08-21 DIAGNOSIS — C50912 Malignant neoplasm of unspecified site of left female breast: Secondary | ICD-10-CM | POA: Diagnosis not present

## 2014-08-23 ENCOUNTER — Encounter: Payer: Self-pay | Admitting: Radiation Oncology

## 2014-08-23 ENCOUNTER — Encounter: Payer: Self-pay | Admitting: *Deleted

## 2014-08-23 NOTE — Progress Notes (Signed)
[facility] Psychosocial Distress Screening Clinical Social Work  Clinical Social Work was referred by distress screening protocol.  The patient scored a 10 on the Psychosocial Distress Thermometer which indicates severe distress. Clinical Social Worker phoned pt to assess for distress and other psychosocial needs. Pt shared that many of her concerns had been addressed since her visit. Pt reports she uses [employer] and this is going well. She shared some issues with having to arrange this resource in advance of her visits. She appears able to navigate this resource and has already managed to get transportation in place for her next doctor visits. Pt shared that she is able to work through her anxiety by reading her bible or watching her soap operas. She feels distraction works well for her. CSW educated pt on Pt and [employer] and the available resources to assist her here. Pt shared she has great support from her son and daughter who are able to assist as needed. Pt aware how to contact Pt and Family Support Team as needed. She was very appreciative of the call and feels much less distressed currently.   ONCBCN DISTRESS SCREENING 08/21/2014  Screening Type Change in Status  Mark the number that describes how much distress you have been experiencing in the past week 10  Practical problem type Insurance;Transportation  Emotional problem type Nervousness/Anxiety;Adjusting to illness  Spiritual/Religous concerns type Facing my mortality  Physical Problem type Pain;Sleep/insomnia;Getting around;Mouth sores/swallowing;Constipation/diarrhea;Changes in urination;Swollen arms/legs  Physician notified of physical symptoms Yes  Referral to clinical social work Yes  Referral to financial advocate Yes    Clinical Social Worker follow up needed: No.  If yes, follow up plan:  Loren Racer, [place]  [facility] Phone: [phone] Fax: [phone]

## 2014-08-23 NOTE — Progress Notes (Signed)
Radiation Oncology         [phone] ________________________________  Name: Helen [name] MRN: [id number]  Date: 08/21/2014  DOB: 12/06/1934  Follow-Up Visit Note  CC: No PCP Per Patient  [name], MD  Diagnosis:    Lymphoma, small lymphocytic   Primary site: Lymphoid Neoplasms   Staging method: AJCC 6th Edition   Clinical: Stage IV signed by [name], MD on 02/23/2014 10:03 PM   Summary: Stage IV Cancer of left breast   Primary site: Breast   Staging method: AJCC 6th Edition   Clinical: Stage IV (T4d, NX, M1) signed by [name], MD on 02/23/2014 10:02 PM   Summary: Stage IV (T4d, NX, M1)    Narrative:  The patient returns today for follow-up.  The patient has continued to be seen by medical oncology. She has had some back pain and I was asked to see the patient regarding this issue. The patient recently had a CT scan of the chest abdomen and pelvis. There was a mild increase in bilateral pulmonary metastases and liver metastases. Makes response in terms of thoracic adenopathy. Stable lytic bone lesions were seen. Specifically the lytic bone lesions involving the lower lumbar spine and bilateral iliac wing appeared stable. Patient also has osseous metastasis in the T11/T12 levels as well.       The patient today states that she is doing fairly well clinically. She denies any pain today which is an improvement. She does have some back pain especially in the lower back. This is worse with activity but does not bother her recently with rest.                         ALLERGIES:  is allergic to codeine; penicillins; sulfonamide derivatives; tape; and tramadol hcl.  Meds: Current Outpatient Prescriptions  Medication Sig Dispense Refill  . acyclovir (ZOVIRAX) 400 MG tablet Take 1 tablet (400 mg total) by mouth daily.  30 tablet  6  . fulvestrant (FASLODEX) 250 MG/5ML injection Inject 500 mg into the muscle every 14 (fourteen) days. One injection each buttock over 1-2 minutes.  Warm prior to use.      [name] gatifloxacin (ZYMAXID) 0.5 % SOLN Place 1 drop into both eyes 4 (four) times daily.      [name] HYDROcodone-acetaminophen (NORCO) 5-325 MG per tablet Take 1 tablet by mouth every 6 (six) hours as needed for moderate pain.  60 tablet  0  . allopurinol (ZYLOPRIM) 300 MG tablet Take 1 tablet (300 mg total) by mouth daily.  30 tablet  1  . anastrozole (ARIMIDEX) 1 MG tablet Take 1 tablet (1 mg total) by mouth daily.  30 tablet  5  . chlorambucil (LEUKERAN) 2 MG tablet Give on an empty stomach 1 hour before or 2 hours after meals. Take 2 tablets on the day of chemo treatment every other week  20 tablet  0  . prednisoLONE acetate (PRED FORTE) 1 % ophthalmic suspension Place 1 drop into both eyes 4 (four) times daily.       No current facility-administered medications for this encounter.    Physical Findings: The patient is in no acute distress. Patient is alert and oriented.  height is 5\' 2"  (1.575 m) and weight is 138 lb 11.2 oz (62.914 kg). Her oral temperature is 97.5 F (36.4 C). Her blood pressure is 122/67 and her pulse is 65. Her respiration is 20 and oxygen saturation is 100%. .   General: Well-developed,  in no acute distress HEENT: Normocephalic, atraumatic Cardiovascular: Regular rate and rhythm Respiratory: Clear to auscultation bilaterally GI: Soft, nontender, normal bowel sounds Extremities: No edema present   Lab Findings: Lab Results  Component Value Date   WBC 4.5 08/08/2014   HGB 11.4* 08/08/2014   HCT 36.5 08/08/2014   MCV 86.5 08/08/2014   PLT 177 08/08/2014     Radiographic Findings: Ct Chest W Contrast  08/04/2014   CLINICAL DATA:  Followup chronic lymphocytic leukemia and lymphoma. Ongoing chemotherapy. Personal history of breast carcinoma. Abdominal pain.  EXAM: CT CHEST, ABDOMEN, AND PELVIS WITH CONTRAST  TECHNIQUE: Multidetector CT imaging of the chest, abdomen and pelvis was performed following the standard protocol during bolus administration  of intravenous contrast.  CONTRAST:  128mL OMNIPAQUE IOHEXOL 300 MG/ML  SOLN  COMPARISON:  02/20/14  FINDINGS: CT CHEST FINDINGS  Mediastinum/Hilar Regions: 9 mm right paratracheal lymph node is stable. Mild increased lymphadenopathy seen in the right cardiophrenic angle, with the largest area measuring 12 mm in short axis on image 39 compared to 6 mm previously.  Other Thoracic Lymphadenopathy: Right axillary lymphadenopathy shows interval decrease, with largest node measuring 10 mm on image 22 compared to 15 mm previously. Other tiny sub-cm right pectoral lymph nodes remain stable. Previous left mastectomy again noted. No left axillary lymphadenopathy identified.  Lungs: Increased size and number of bilateral pulmonary nodules is demonstrated compared to previous study. Index nodule in the right lower lobe measures 12 mm on image 32 compared to 5 mm previously. Another index nodule in the peripheral left lower lobe measures 11 mm on image 40 compared to 8 mm previously.  Pleura:  No evidence of effusion or mass.  Vascular/Cardiac:  No acute findings identified.  Musculoskeletal: Lytic lesion involving the proximal right humerus appears stable. No other suspicious bone lesions seen within the thorax.  Other:  None.  CT ABDOMEN AND PELVIS FINDINGS  Hepatobiliary: Increased number and size of low attenuation liver masses is demonstrated compared to previous exam. Largest lesion in the right hepatic lobe currently measures 3.1 cm on image 49 compared with 2.1 cm previously. Cholelithiasis again seen, without evidence cholecystitis.  Pancreas: No mass, inflammatory changes, or other parenchymal abnormality identified.  Spleen:  Within normal limits in size and appearance.  Adrenal Glands:  No mass identified.  Kidneys/Urinary Tract: No masses identified. Small renal cysts again noted. No evidence of hydronephrosis.  Stomach/Bowel/Peritoneum: No evidence of wall thickening, mass, or obstruction.  Vascular/Lymphatic: No  pathologically enlarged lymph nodes identified. No other significant abnormality identified.  Reproductive: Multiple small calcified uterine fibroids appears stable.  Other:  Tiny periumbilical hernia containing only fat is unchanged.  Musculoskeletal: Lytic bone lesions involving the lower lumbar spine and bilateral iliac wings appears stable.  IMPRESSION: Mild increase in bilateral pulmonary metastases and liver metastases.  Mixed response of thoracic adenopathy, with decreased right axillary lymphadenopathy but mild increase in adenopathy in right cardiophrenic angle.  Stable lytic bone lesions.  Stable incidental findings including cholelithiasis, small calcified uterine fibroids, and tiny periumbilical hernia.   Electronically Signed   By: Earle Gell M.D.   On: 08/04/2014 13:35   Ct Abdomen Pelvis W Contrast  08/04/2014   CLINICAL DATA:  Followup chronic lymphocytic leukemia and lymphoma. Ongoing chemotherapy. Personal history of breast carcinoma. Abdominal pain.  EXAM: CT CHEST, ABDOMEN, AND PELVIS WITH CONTRAST  TECHNIQUE: Multidetector CT imaging of the chest, abdomen and pelvis was performed following the standard protocol during bolus administration of intravenous contrast.  CONTRAST:  163mL OMNIPAQUE IOHEXOL 300 MG/ML  SOLN  COMPARISON:  02/20/14  FINDINGS: CT CHEST FINDINGS  Mediastinum/Hilar Regions: 9 mm right paratracheal lymph node is stable. Mild increased lymphadenopathy seen in the right cardiophrenic angle, with the largest area measuring 12 mm in short axis on image 39 compared to 6 mm previously.  Other Thoracic Lymphadenopathy: Right axillary lymphadenopathy shows interval decrease, with largest node measuring 10 mm on image 22 compared to 15 mm previously. Other tiny sub-cm right pectoral lymph nodes remain stable. Previous left mastectomy again noted. No left axillary lymphadenopathy identified.  Lungs: Increased size and number of bilateral pulmonary nodules is demonstrated compared to  previous study. Index nodule in the right lower lobe measures 12 mm on image 32 compared to 5 mm previously. Another index nodule in the peripheral left lower lobe measures 11 mm on image 40 compared to 8 mm previously.  Pleura:  No evidence of effusion or mass.  Vascular/Cardiac:  No acute findings identified.  Musculoskeletal: Lytic lesion involving the proximal right humerus appears stable. No other suspicious bone lesions seen within the thorax.  Other:  None.  CT ABDOMEN AND PELVIS FINDINGS  Hepatobiliary: Increased number and size of low attenuation liver masses is demonstrated compared to previous exam. Largest lesion in the right hepatic lobe currently measures 3.1 cm on image 49 compared with 2.1 cm previously. Cholelithiasis again seen, without evidence cholecystitis.  Pancreas: No mass, inflammatory changes, or other parenchymal abnormality identified.  Spleen:  Within normal limits in size and appearance.  Adrenal Glands:  No mass identified.  Kidneys/Urinary Tract: No masses identified. Small renal cysts again noted. No evidence of hydronephrosis.  Stomach/Bowel/Peritoneum: No evidence of wall thickening, mass, or obstruction.  Vascular/Lymphatic: No pathologically enlarged lymph nodes identified. No other significant abnormality identified.  Reproductive: Multiple small calcified uterine fibroids appears stable.  Other:  Tiny periumbilical hernia containing only fat is unchanged.  Musculoskeletal: Lytic bone lesions involving the lower lumbar spine and bilateral iliac wings appears stable.  IMPRESSION: Mild increase in bilateral pulmonary metastases and liver metastases.  Mixed response of thoracic adenopathy, with decreased right axillary lymphadenopathy but mild increase in adenopathy in right cardiophrenic angle.  Stable lytic bone lesions.  Stable incidental findings including cholelithiasis, small calcified uterine fibroids, and tiny periumbilical hernia.   Electronically Signed   By: Earle Gell  M.D.   On: 08/04/2014 13:35   Mr Total Spine Mets Screening  08/11/2014   CLINICAL DATA:  79 year old female current history non-Hodgkin's lymphoma undergoing chemotherapy, also personal history of breast cancer in 2009. Progressive liver and lung metastatic disease and Lytic bone lesions. Subsequent encounter.  EXAM: MRI TOTAL SPINE WITHOUT AND WITH CONTRAST  TECHNIQUE: Multisequence MR imaging of the spine from the cervical spine to the sacrum was performed prior to and following IV contrast administration for evaluation of spinal metastatic disease.  CONTRAST:  20mL MULTIHANCE GADOBENATE DIMEGLUMINE 529 MG/ML IV SOLN  COMPARISON:  CT chest abdomen pelvis 08/04/2014. Lumbar MRI 01/18/2014, and earlier.  FINDINGS: Cervical Findings:  No definite bony metastatic disease in the cervical spine. Degenerative endplate changes felt responsible for mild marrow heterogeneity at the anterior inferior C5 endplate, with adjacent degenerative disc osteophyte complex. No abnormal enhancement in the cervical spine. Cervical spinal cord signal within normal limits. No abnormal intradural enhancement identified.  Patchy nonspecific T2 hyperintensity in the pons without enhancement, most commonly due to chronic small vessel disease.  Thoracic Findings:  Normal thoracic bone marrow signal from the T1  to the T10 levels. No marrow edema or vertebral metastases at these levels. Thoracic spinal cord corresponding to these levels is normal. No abnormal thoracic intradural enhancement.  T11: Stable to mildly progressed spinous process metastasis in the midline posteriorly (series 12, image 9). More of the spinous process appears replaced by tumor today. Still, no dural or epidural extension identified.  T12: Progressed size of a metastasis within the left T12 superior articulating facet (series 9, image 12, series 10 and series 12, image 12. This lesion now measures 15 mm CC, previously 12 mm. No extraosseous extension of tumor  identified.  Note that these levels were numbered differently on prior studies which only included the lumbar spine (transitional anatomy, see lumbar section below).  Normal conus medullaris at this level.  Lumbar Findings:  Different numbering system used on the lumbar and lower thoracic levels than on prior studies from March and earlier. There is transitional anatomy with sacralized L5 level, also confirmed on the CT 08/04/2014. Correlation with radiographs is recommended prior to any treatment intervention.  Chronic enhancing metastasis occupying most of the central L3 vertebral body (designated is L4 on prior studies) has not significantly changed in overall size. There is early ventral epidural extension on the left (series 17, image 28). No malignant neural impingement.  Underlying L4-L5 spondylolysis and spondylolisthesis (previously designated the L5-S1 level), with interval regressed signal abnormality in the L4 inferior endplate which may be degenerative rather than malignant. Stable associated degenerative spinal and foraminal stenosis at this level.  Sacralized L5 level and visible sacrum appear free of tumor. There is tumor replacement of the medial iliac bones bilaterally seen on series 17, images 37-40.  IMPRESSION: 1. 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 Subsequently, lower thoracic and lumbar metastases are numbered differently today than on prior studies. 2. Mildly progressed T11 and T12 posterior element metastases without epidural or extraosseous extension. 3. Mildly progressed chronic L3 vertebral body metastasis with early epidural extension on the left. No malignant neural impingement. 4. No spinal metastasis identified in the cervical or upper thoracic segments. 5. Advanced chronic degenerative changes at L4-L5 with spondylolisthesis and degenerative spinal and foraminal stenosis.   Electronically Signed   By: Lars Pinks M.D.   On:  08/11/2014 15:41    Impression:    The patient clinically states she is doing fairly well. No significant ongoing severe pain in the back. Specifically, the patient's pain is really in the lower back/lumbar spine region. She has received prior radiation treatment to this area. The patient has some metastatic disease higher in the lower thoracic spine. She denies any pain at this location to me today.  Plan:  I do not believe that the patient needs to have any palliative radiation treatment additionally at this time. I would like to follow her more closely and I have asked for the patient to come back in several months.  I spent 30 minutes with the patient today, the majority of which was spent counseling the patient on the diagnosis of cancer and coordinating care.   Jodelle Gross, M.D., Ph.D.

## 2014-08-29 ENCOUNTER — Ambulatory Visit (HOSPITAL_BASED_OUTPATIENT_CLINIC_OR_DEPARTMENT_OTHER): Payer: Medicare Other

## 2014-08-29 VITALS — BP 125/50 | HR 61 | Temp 97.6°F | Resp 16

## 2014-08-29 DIAGNOSIS — C50912 Malignant neoplasm of unspecified site of left female breast: Secondary | ICD-10-CM

## 2014-08-29 DIAGNOSIS — C7951 Secondary malignant neoplasm of bone: Secondary | ICD-10-CM

## 2014-08-29 MED ORDER — FULVESTRANT 250 MG/5ML IM SOLN
500.0000 mg | INTRAMUSCULAR | Status: DC
  Administered 2014-08-29: 500 mg via INTRAMUSCULAR
  Filled 2014-08-29: qty 10

## 2014-08-29 NOTE — Patient Instructions (Signed)
Fulvestrant injection  What is this medicine?  FULVESTRANT (ful VES trant) blocks the effects of estrogen. It is used to treat breast cancer in women past the age of menopause.  This medicine may be used for other purposes; ask your health care provider or pharmacist if you have questions.  COMMON BRAND NAME(S): FASLODEX  What should I tell my health care provider before I take this medicine?  They need to know if you have any of these conditions:  -bleeding problems  -liver disease  -low levels of platelets in the blood  -an unusual or allergic reaction to fulvestrant, other medicines, foods, dyes, or preservatives  -pregnant or trying to get pregnant  -breast-feeding  How should I use this medicine?  This medicine is for injection into a muscle. It is usually given by a health care professional in a hospital or clinic setting.  Talk to your pediatrician regarding the use of this medicine in children. Special care may be needed.  Overdosage: If you think you have taken too much of this medicine contact a poison control center or emergency room at once.  NOTE: This medicine is only for you. Do not share this medicine with others.  What if I miss a dose?  It is important not to miss your dose. Call your doctor or health care professional if you are unable to keep an appointment.  What may interact with this medicine?  -medicines that treat or prevent blood clots like warfarin, enoxaparin, and dalteparin  This list may not describe all possible interactions. Give your health care provider a list of all the medicines, herbs, non-prescription drugs, or dietary supplements you use. Also tell them if you smoke, drink alcohol, or use illegal drugs. Some items may interact with your medicine.  What should I watch for while using this medicine?  Your condition will be monitored carefully while you are receiving this medicine. You will need important blood work done while you are taking this medicine.  Do not become pregnant  while taking this medicine. Women should inform their doctor if they wish to become pregnant or think they might be pregnant. There is a potential for serious side effects to an unborn child. Talk to your health care professional or pharmacist for more information.  What side effects may I notice from receiving this medicine?  Side effects that you should report to your doctor or health care professional as soon as possible:  -allergic reactions like skin rash, itching or hives, swelling of the face, lips, or tongue  -feeling faint or lightheaded, falls  -fever or flu-like symptoms  -sore throat  -vaginal bleeding  Side effects that usually do not require medical attention (report to your doctor or health care professional if they continue or are bothersome):  -aches, pains  -constipation or diarrhea  -headache  -hot flashes  -nausea, vomiting  -pain at site where injected  -stomach pain  This list may not describe all possible side effects. Call your doctor for medical advice about side effects. You may report side effects to FDA at 1-800-FDA-1088.  Where should I keep my medicine?  This drug is given in a hospital or clinic and will not be stored at home.  NOTE: This sheet is a summary. It may not cover all possible information. If you have questions about this medicine, talk to your doctor, pharmacist, or health care provider.   2015, Elsevier/Gold Standard. (2008-02-28 15:39:24)

## 2014-09-12 ENCOUNTER — Ambulatory Visit (HOSPITAL_BASED_OUTPATIENT_CLINIC_OR_DEPARTMENT_OTHER): Payer: Medicare Other

## 2014-09-12 ENCOUNTER — Ambulatory Visit (HOSPITAL_BASED_OUTPATIENT_CLINIC_OR_DEPARTMENT_OTHER): Payer: Medicare Other | Admitting: Hematology and Oncology

## 2014-09-12 ENCOUNTER — Telehealth: Payer: Self-pay | Admitting: Hematology and Oncology

## 2014-09-12 ENCOUNTER — Encounter: Payer: Self-pay | Admitting: Hematology and Oncology

## 2014-09-12 ENCOUNTER — Other Ambulatory Visit (HOSPITAL_BASED_OUTPATIENT_CLINIC_OR_DEPARTMENT_OTHER): Payer: Medicare Other

## 2014-09-12 VITALS — BP 157/68 | HR 65 | Temp 97.7°F | Resp 18 | Ht 62.0 in | Wt 138.6 lb

## 2014-09-12 DIAGNOSIS — C7800 Secondary malignant neoplasm of unspecified lung: Secondary | ICD-10-CM

## 2014-09-12 DIAGNOSIS — C50912 Malignant neoplasm of unspecified site of left female breast: Secondary | ICD-10-CM

## 2014-09-12 DIAGNOSIS — K089 Disorder of teeth and supporting structures, unspecified: Secondary | ICD-10-CM

## 2014-09-12 DIAGNOSIS — C7801 Secondary malignant neoplasm of right lung: Secondary | ICD-10-CM

## 2014-09-12 DIAGNOSIS — C7951 Secondary malignant neoplasm of bone: Secondary | ICD-10-CM

## 2014-09-12 DIAGNOSIS — Z5111 Encounter for antineoplastic chemotherapy: Secondary | ICD-10-CM

## 2014-09-12 DIAGNOSIS — C8590 Non-Hodgkin lymphoma, unspecified, unspecified site: Secondary | ICD-10-CM

## 2014-09-12 DIAGNOSIS — C8359 Lymphoblastic (diffuse) lymphoma, extranodal and solid organ sites: Secondary | ICD-10-CM

## 2014-09-12 DIAGNOSIS — C7802 Secondary malignant neoplasm of left lung: Secondary | ICD-10-CM

## 2014-09-12 DIAGNOSIS — C8300 Small cell B-cell lymphoma, unspecified site: Secondary | ICD-10-CM

## 2014-09-12 LAB — CBC WITH DIFFERENTIAL/PLATELET
BASO%: 0.7 % (ref 0.0–2.0)
BASOS ABS: 0 10*3/uL (ref 0.0–0.1)
EOS%: 4.6 % (ref 0.0–7.0)
Eosinophils Absolute: 0.2 10*3/uL (ref 0.0–0.5)
HEMATOCRIT: 35.4 % (ref 34.8–46.6)
HEMOGLOBIN: 11.3 g/dL — AB (ref 11.6–15.9)
LYMPH#: 0.3 10*3/uL — AB (ref 0.9–3.3)
LYMPH%: 6.1 % — ABNORMAL LOW (ref 14.0–49.7)
MCH: 27.3 pg (ref 25.1–34.0)
MCHC: 31.8 g/dL (ref 31.5–36.0)
MCV: 85.7 fL (ref 79.5–101.0)
MONO#: 0.7 10*3/uL (ref 0.1–0.9)
MONO%: 15.5 % — ABNORMAL HIGH (ref 0.0–14.0)
NEUT#: 3.4 10*3/uL (ref 1.5–6.5)
NEUT%: 73.1 % (ref 38.4–76.8)
Platelets: 229 10*3/uL (ref 145–400)
RBC: 4.13 10*6/uL (ref 3.70–5.45)
RDW: 15 % — AB (ref 11.2–14.5)
WBC: 4.6 10*3/uL (ref 3.9–10.3)

## 2014-09-12 LAB — COMPREHENSIVE METABOLIC PANEL (CC13)
ALBUMIN: 3.9 g/dL (ref 3.5–5.0)
ALT: 15 U/L (ref 0–55)
ANION GAP: 7 meq/L (ref 3–11)
AST: 20 U/L (ref 5–34)
Alkaline Phosphatase: 86 U/L (ref 40–150)
BUN: 13.1 mg/dL (ref 7.0–26.0)
CO2: 26 meq/L (ref 22–29)
CREATININE: 0.8 mg/dL (ref 0.6–1.1)
Calcium: 10.3 mg/dL (ref 8.4–10.4)
Chloride: 108 mEq/L (ref 98–109)
Glucose: 94 mg/dl (ref 70–140)
POTASSIUM: 4.3 meq/L (ref 3.5–5.1)
Sodium: 141 mEq/L (ref 136–145)
Total Bilirubin: 0.2 mg/dL (ref 0.20–1.20)
Total Protein: 6.7 g/dL (ref 6.4–8.3)

## 2014-09-12 LAB — LACTATE DEHYDROGENASE (CC13): LDH: 333 U/L — ABNORMAL HIGH (ref 125–245)

## 2014-09-12 LAB — TECHNOLOGIST REVIEW

## 2014-09-12 LAB — URIC ACID (CC13): Uric Acid, Serum: 5.7 mg/dl (ref 2.6–7.4)

## 2014-09-12 MED ORDER — HYDROCODONE-ACETAMINOPHEN 5-325 MG PO TABS: 1.0000 | ORAL_TABLET | Freq: Four times a day (QID) | ORAL | Status: DC | PRN

## 2014-09-12 MED ORDER — FULVESTRANT 250 MG/5ML IM SOLN
500.0000 mg | INTRAMUSCULAR | Status: DC
  Administered 2014-09-12: 500 mg via INTRAMUSCULAR
  Filled 2014-09-12: qty 10

## 2014-09-12 NOTE — Assessment & Plan Note (Signed)
She is not symptomatic. This is likely due to recurrence of breast cancer She will continue her Faslodex injection monthly. I will check her tumor markers and plan to repeat imaging study in 3-6 months.

## 2014-09-12 NOTE — Assessment & Plan Note (Signed)
CLL appears to have responded to treatment. Continue observation.

## 2014-09-12 NOTE — Assessment & Plan Note (Signed)
She need to be started on XGeva the but had problems with dental issues in the past. I recommend dental evaluation as soon as possible and will refer her to our dentist for assessment

## 2014-09-12 NOTE — Telephone Encounter (Signed)
gv adn printed appt sched and avs fo rpt for Dec.....S.w. lisa And pt will get call to sche appt.

## 2014-09-12 NOTE — Patient Instructions (Signed)
Fulvestrant injection  What is this medicine?  FULVESTRANT (ful VES trant) blocks the effects of estrogen. It is used to treat breast cancer in women past the age of menopause.  This medicine may be used for other purposes; ask your health care provider or pharmacist if you have questions.  COMMON BRAND NAME(S): FASLODEX  What should I tell my health care provider before I take this medicine?  They need to know if you have any of these conditions:  -bleeding problems  -liver disease  -low levels of platelets in the blood  -an unusual or allergic reaction to fulvestrant, other medicines, foods, dyes, or preservatives  -pregnant or trying to get pregnant  -breast-feeding  How should I use this medicine?  This medicine is for injection into a muscle. It is usually given by a health care professional in a hospital or clinic setting.  Talk to your pediatrician regarding the use of this medicine in children. Special care may be needed.  Overdosage: If you think you have taken too much of this medicine contact a poison control center or emergency room at once.  NOTE: This medicine is only for you. Do not share this medicine with others.  What if I miss a dose?  It is important not to miss your dose. Call your doctor or health care professional if you are unable to keep an appointment.  What may interact with this medicine?  -medicines that treat or prevent blood clots like warfarin, enoxaparin, and dalteparin  This list may not describe all possible interactions. Give your health care provider a list of all the medicines, herbs, non-prescription drugs, or dietary supplements you use. Also tell them if you smoke, drink alcohol, or use illegal drugs. Some items may interact with your medicine.  What should I watch for while using this medicine?  Your condition will be monitored carefully while you are receiving this medicine. You will need important blood work done while you are taking this medicine.  Do not become pregnant  while taking this medicine. Women should inform their doctor if they wish to become pregnant or think they might be pregnant. There is a potential for serious side effects to an unborn child. Talk to your health care professional or pharmacist for more information.  What side effects may I notice from receiving this medicine?  Side effects that you should report to your doctor or health care professional as soon as possible:  -allergic reactions like skin rash, itching or hives, swelling of the face, lips, or tongue  -feeling faint or lightheaded, falls  -fever or flu-like symptoms  -sore throat  -vaginal bleeding  Side effects that usually do not require medical attention (report to your doctor or health care professional if they continue or are bothersome):  -aches, pains  -constipation or diarrhea  -headache  -hot flashes  -nausea, vomiting  -pain at site where injected  -stomach pain  This list may not describe all possible side effects. Call your doctor for medical advice about side effects. You may report side effects to FDA at 1-800-FDA-1088.  Where should I keep my medicine?  This drug is given in a hospital or clinic and will not be stored at home.  NOTE: This sheet is a summary. It may not cover all possible information. If you have questions about this medicine, talk to your doctor, pharmacist, or health care provider.   2015, Elsevier/Gold Standard. (2008-02-28 15:39:24)

## 2014-09-12 NOTE — Progress Notes (Signed)
[facility] OFFICE PROGRESS NOTE  Patient Care Team: No Pcp Per Patient as PCP - General (General Practice) [name], MD as Consulting Physician (Hematology and Oncology)  SUMMARY OF ONCOLOGIC HISTORY:  #1 left breast carcinoma originally presenting as a fungating mass and was diagnosed with metastatic breast cancer in October 2009. Patient underwent neoadjuvant chemotherapy followed by mastectomy. She is currently on Arimidex 1mg  daily and monthly Xgeva. 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 She received 6 cycles of CVP and Rituxan, followed by maintenance Rituxan. Later, her disease recurred and underwent 4 cycles of R-Bendamustine. PET/CT showed CR and then she proceeded to maintenance Rituxan.  #3 In April 2015, repeat CT scan show new lymphadenopathy. #4 on 04/03/2014, axillary lymph node dissection showed recurrence of CLL/SLL #5 On 04/18/14: She was started on Obinutuzumab #6 on 08/04/2014, repeat CT scan showed improvement of disease contour of the right axilla but with progression of liver metastasis & lung metastasis #7 on 08/15/2014, she was started on Faslodex. INTERVAL HISTORY: Please see below for problem oriented charting. She had one episode of mild cough after dinner. That has resolved. Denies fevers or chills. Denies worsening bone pain. She takes Percocet rarely for occasional arthritis pain.  REVIEW OF SYSTEMS:   Constitutional: Denies fevers, chills or abnormal weight loss Eyes: Denies blurriness of vision Ears, nose, mouth, throat, and face: Denies mucositis or sore throat Cardiovascular: Denies palpitation, chest discomfort or lower extremity swelling Gastrointestinal:  Denies nausea, heartburn or change in bowel habits Skin: Denies abnormal skin rashes Lymphatics: Denies new lymphadenopathy or easy bruising Neurological:Denies numbness, tingling or  new weaknesses Behavioral/Psych: Mood is stable, no new changes  All other systems were reviewed with the patient and are negative.  I have reviewed the past medical history, past surgical history, social history and family history with the patient and they are unchanged from previous note.  ALLERGIES:  is allergic to codeine; penicillins; sulfonamide derivatives; tape; and tramadol hcl.  MEDICATIONS:  Current Outpatient Prescriptions  Medication Sig Dispense Refill  . calcium carbonate 200 MG capsule Take 200 mg by mouth daily.    . fulvestrant (FASLODEX) 250 MG/5ML injection Inject 500 mg into the muscle every 14 (fourteen) days. One injection each buttock over 1-2 minutes. Warm prior to use.    [name] HYDROcodone-acetaminophen (NORCO) 5-325 MG per tablet Take 1 tablet by mouth every 6 (six) hours as needed for moderate pain. 60 tablet 0   No current facility-administered medications for this visit.    PHYSICAL EXAMINATION: ECOG PERFORMANCE STATUS: 0 - Asymptomatic  Filed Vitals:   09/12/14 1050  BP: 157/68  Pulse: 65  Temp: 97.7 F (36.5 C)  Resp: 18   Filed Weights   09/12/14 1050  Weight: 138 lb 9.6 oz (62.869 kg)    GENERAL:alert, no distress and comfortable SKIN: skin color, texture, turgor are normal, no rashes or significant lesions EYES: normal, Conjunctiva are pink and non-injected, sclera clear OROPHARYNX:no exudate, no erythema and lips, buccal mucosa, and tongue normal  NECK: supple, thyroid normal size, non-tender, without nodularity LYMPH:  no palpable lymphadenopathy in the cervical, axillary or inguinal LUNGS: clear to auscultation and percussion with normal breathing effort HEART: regular rate & rhythm and no murmurs and no lower extremity edema ABDOMEN:abdomen soft, non-tender and normal bowel sounds Musculoskeletal:no cyanosis of digits and no clubbing  NEURO: alert & oriented x 3 with fluent speech, no focal motor/sensory deficits  LABORATORY DATA:  I  have reviewed the data as listed    Component Value Date/Time   NA 140 08/08/2014 0853   NA 140 06/13/2014 0819   NA 142 06/01/2012 1008   K 4.0 08/08/2014 0853   K 4.0 06/13/2014 0819   K 3.8 06/01/2012 1008   CL 101 06/13/2014 0819   CL 107 04/22/2013 0922   CL 103 06/01/2012 1008   CO2 26 08/08/2014 0853   CO2 25 06/13/2014 0819   CO2 25 06/01/2012 1008   GLUCOSE 80 08/08/2014 0853   GLUCOSE 98 06/13/2014 0819   GLUCOSE 89 04/22/2013 0922   GLUCOSE 108 06/01/2012 1008   BUN 12.9 08/08/2014 0853   BUN 13 06/13/2014 0819   BUN 9 06/01/2012 1008   CREATININE 0.8 08/08/2014 0853   CREATININE 0.81 06/13/2014 0819   CREATININE 0.9 06/01/2012 1008   CALCIUM 10.7* 08/08/2014 0853   CALCIUM 10.8* 06/13/2014 0819   CALCIUM 9.8 06/01/2012 1008   PROT 7.4 08/08/2014 0853   PROT 7.5 06/13/2014 0819   PROT 8.1 08/14/2010 1251   ALBUMIN 4.1 08/08/2014 0853   ALBUMIN 3.9 06/13/2014 0819   AST 23 08/08/2014 0853   AST 23 06/13/2014 0819   AST 19 08/14/2010 1251   ALT 14 08/08/2014 0853   ALT 13 06/13/2014 0819   ALT 9* 08/14/2010 1251   ALKPHOS 86 08/08/2014 0853   ALKPHOS 89 06/13/2014 0819   ALKPHOS 78 08/14/2010 1251   BILITOT 0.32 08/08/2014 0853   BILITOT 0.2* 06/13/2014 0819   BILITOT 0.40 08/14/2010 1251   GFRNONAA 87* 07/06/2012 1122   GFRAA >90 07/06/2012 1122    No results found for: SPEP, UPEP  Lab Results  Component Value Date   WBC 4.6 09/12/2014   NEUTROABS 3.4 09/12/2014   HGB 11.3* 09/12/2014   HCT 35.4 09/12/2014   MCV 85.7 09/12/2014   PLT 229 09/12/2014      Chemistry      Component Value Date/Time   NA 140 08/08/2014 0853   NA 140 06/13/2014 0819   NA 142 06/01/2012 1008   K 4.0 08/08/2014 0853   K 4.0 06/13/2014 0819   K 3.8 06/01/2012 1008   CL 101 06/13/2014 0819   CL 107 04/22/2013 0922   CL 103 06/01/2012 1008   CO2 26 08/08/2014 0853   CO2 25 06/13/2014 0819   CO2 25 06/01/2012 1008   BUN 12.9 08/08/2014 0853   BUN 13 06/13/2014  0819   BUN 9 06/01/2012 1008   CREATININE 0.8 08/08/2014 0853   CREATININE 0.81 06/13/2014 0819   CREATININE 0.9 06/01/2012 1008      Component Value Date/Time   CALCIUM 10.7* 08/08/2014 0853   CALCIUM 10.8* 06/13/2014 0819   CALCIUM 9.8 06/01/2012 1008   ALKPHOS 86 08/08/2014 0853   ALKPHOS 89 06/13/2014 0819   ALKPHOS 78 08/14/2010 1251   AST 23 08/08/2014 0853   AST 23 06/13/2014 0819   AST 19 08/14/2010 1251   ALT 14 08/08/2014 0853   ALT 13 06/13/2014 0819   ALT 9* 08/14/2010 1251   BILITOT 0.32 08/08/2014 0853   BILITOT 0.2* 06/13/2014 0819   BILITOT 0.40 08/14/2010 1251     ASSESSMENT & PLAN:  Lymphoma, small lymphocytic CLL appears to have responded to treatment. Continue observation.  Metastasis to bone Her MRI appears somewhat stable and she has not much symptoms apart from chronic arthritis. I recommend review with radiation oncologist to see if any additional palliative radiation  therapy would be beneficial to reduce risk of fracture. He recommended observation She need to be started on XGeva the but had problems with dental issues in the past. I recommend dental evaluation as soon as possible and will refer her to our dentist for assessment    Pulmonary metastases She is not symptomatic. This is likely due to recurrence of breast cancer She will continue her Faslodex injection monthly. I will check her tumor markers and plan to repeat imaging study in 3-6 months.   Poor dentition She need to be started on XGeva the but had problems with dental issues in the past. I recommend dental evaluation as soon as possible and will refer her to our dentist for assessment     Orders Placed This Encounter  Procedures  . Cancer antigen 27.29    Standing Status: Future     Number of Occurrences:      Standing Expiration Date: 10/17/2015  . Ambulatory Referral to Dentistry (specifically to Dr. Enrique Sack)    Referral Priority:  Routine    Referral Type:  Consultation     Referral Reason:  Specialty Services Required    Requested Specialty:  Dental General Practice    Number of Visits Requested:  1   All questions were answered. The patient knows to call the clinic with any problems, questions or concerns. No barriers to learning was detected. I spent 25 minutes counseling the patient face to face. The total time spent in the appointment was 30 minutes and more than 50% was on counseling and review of test results     [facility], [place], MD 09/12/2014 11:06 AM

## 2014-09-12 NOTE — Assessment & Plan Note (Signed)
Her MRI appears somewhat stable and she has not much symptoms apart from chronic arthritis. I recommend review with radiation oncologist to see if any additional palliative radiation therapy would be beneficial to reduce risk of fracture. He recommended observation She need to be started on XGeva the but had problems with dental issues in the past. I recommend dental evaluation as soon as possible and will refer her to our dentist for assessment

## 2014-09-13 LAB — CANCER ANTIGEN 27.29: CA 27.29: 229 U/mL — AB (ref 0–39)

## 2014-10-09 ENCOUNTER — Telehealth (HOSPITAL_COMMUNITY): Payer: Self-pay

## 2014-10-09 NOTE — Telephone Encounter (Signed)
10/09/14     Patient called to cancel appt. on 10/11/14 @ 8:00 w/Dr. Enrique Sack in Dental Medicine due to transportation issues.  Patient rescheduled appt. for 11/08/14 @ 10:00 .   [facility]

## 2014-10-10 ENCOUNTER — Ambulatory Visit (HOSPITAL_BASED_OUTPATIENT_CLINIC_OR_DEPARTMENT_OTHER): Payer: Medicare Other

## 2014-10-10 ENCOUNTER — Other Ambulatory Visit (HOSPITAL_BASED_OUTPATIENT_CLINIC_OR_DEPARTMENT_OTHER): Payer: Medicare Other

## 2014-10-10 ENCOUNTER — Telehealth: Payer: Self-pay | Admitting: Hematology and Oncology

## 2014-10-10 ENCOUNTER — Ambulatory Visit (HOSPITAL_BASED_OUTPATIENT_CLINIC_OR_DEPARTMENT_OTHER): Payer: Medicare Other | Admitting: Hematology and Oncology

## 2014-10-10 ENCOUNTER — Encounter: Payer: Self-pay | Admitting: Hematology and Oncology

## 2014-10-10 VITALS — BP 155/60 | HR 64 | Temp 97.6°F | Resp 18 | Ht 62.0 in | Wt 140.2 lb

## 2014-10-10 DIAGNOSIS — R21 Rash and other nonspecific skin eruption: Secondary | ICD-10-CM

## 2014-10-10 DIAGNOSIS — C8300 Small cell B-cell lymphoma, unspecified site: Secondary | ICD-10-CM

## 2014-10-10 DIAGNOSIS — R059 Cough, unspecified: Secondary | ICD-10-CM

## 2014-10-10 DIAGNOSIS — C50912 Malignant neoplasm of unspecified site of left female breast: Secondary | ICD-10-CM

## 2014-10-10 DIAGNOSIS — K089 Disorder of teeth and supporting structures, unspecified: Secondary | ICD-10-CM

## 2014-10-10 DIAGNOSIS — D630 Anemia in neoplastic disease: Secondary | ICD-10-CM

## 2014-10-10 DIAGNOSIS — K088 Other specified disorders of teeth and supporting structures: Secondary | ICD-10-CM

## 2014-10-10 DIAGNOSIS — C7951 Secondary malignant neoplasm of bone: Secondary | ICD-10-CM

## 2014-10-10 DIAGNOSIS — R05 Cough: Secondary | ICD-10-CM

## 2014-10-10 DIAGNOSIS — Z5111 Encounter for antineoplastic chemotherapy: Secondary | ICD-10-CM

## 2014-10-10 DIAGNOSIS — C8590 Non-Hodgkin lymphoma, unspecified, unspecified site: Secondary | ICD-10-CM

## 2014-10-10 HISTORY — DX: Cough, unspecified: R05.9

## 2014-10-10 LAB — CBC WITH DIFFERENTIAL/PLATELET
BASO%: 1.2 % (ref 0.0–2.0)
Basophils Absolute: 0.1 10*3/uL (ref 0.0–0.1)
EOS%: 3.9 % (ref 0.0–7.0)
Eosinophils Absolute: 0.2 10*3/uL (ref 0.0–0.5)
HEMATOCRIT: 35.8 % (ref 34.8–46.6)
HGB: 11.4 g/dL — ABNORMAL LOW (ref 11.6–15.9)
LYMPH#: 0.4 10*3/uL — AB (ref 0.9–3.3)
LYMPH%: 8.6 % — ABNORMAL LOW (ref 14.0–49.7)
MCH: 27.6 pg (ref 25.1–34.0)
MCHC: 31.8 g/dL (ref 31.5–36.0)
MCV: 86.9 fL (ref 79.5–101.0)
MONO#: 0.7 10*3/uL (ref 0.1–0.9)
MONO%: 15.2 % — ABNORMAL HIGH (ref 0.0–14.0)
NEUT#: 3.5 10*3/uL (ref 1.5–6.5)
NEUT%: 71.1 % (ref 38.4–76.8)
Platelets: 235 10*3/uL (ref 145–400)
RBC: 4.12 10*6/uL (ref 3.70–5.45)
RDW: 14.9 % — ABNORMAL HIGH (ref 11.2–14.5)
WBC: 4.9 10*3/uL (ref 3.9–10.3)

## 2014-10-10 LAB — COMPREHENSIVE METABOLIC PANEL (CC13)
ALT: 18 U/L (ref 0–55)
AST: 22 U/L (ref 5–34)
Albumin: 4.1 g/dL (ref 3.5–5.0)
Alkaline Phosphatase: 89 U/L (ref 40–150)
Anion Gap: 12 mEq/L — ABNORMAL HIGH (ref 3–11)
BILIRUBIN TOTAL: 0.21 mg/dL (ref 0.20–1.20)
BUN: 16.9 mg/dL (ref 7.0–26.0)
CHLORIDE: 105 meq/L (ref 98–109)
CO2: 24 mEq/L (ref 22–29)
CREATININE: 0.8 mg/dL (ref 0.6–1.1)
Calcium: 10.2 mg/dL (ref 8.4–10.4)
EGFR: 81 mL/min/{1.73_m2} — ABNORMAL LOW (ref >90)
Glucose: 82 mg/dl (ref 70–140)
Potassium: 4.4 mEq/L (ref 3.5–5.1)
Sodium: 141 mEq/L (ref 136–145)
Total Protein: 7 g/dL (ref 6.4–8.3)

## 2014-10-10 MED ORDER — FULVESTRANT 250 MG/5ML IM SOLN
500.0000 mg | INTRAMUSCULAR | Status: DC
  Administered 2014-10-10: 500 mg via INTRAMUSCULAR
  Filled 2014-10-10: qty 10

## 2014-10-10 MED ORDER — GUAIFENESIN-DM 100-10 MG/5ML PO SYRP: 5.0000 mL | ORAL_SOLUTION | ORAL | Status: DC | PRN

## 2014-10-10 NOTE — Assessment & Plan Note (Signed)
She is not symptomatic. This is likely due to recurrence of breast cancer She will continue her Faslodex injection monthly. I will check her tumor markers and plan to repeat imaging next month before she returns.

## 2014-10-10 NOTE — Assessment & Plan Note (Signed)
This is related to allergic rhinitis. I recommend over-the-counter, suppressive medicine with nasal decongestant. I gave her prescription today.

## 2014-10-10 NOTE — Progress Notes (Signed)
[facility] OFFICE PROGRESS NOTE  Patient Care Team: No Pcp Per Patient as PCP - General (General Practice) [name], MD as Consulting Physician (Hematology and Oncology)  SUMMARY OF ONCOLOGIC HISTORY: #1 left breast carcinoma originally presenting as a fungating mass and was diagnosed with metastatic breast cancer in October 2009. Patient underwent neoadjuvant chemotherapy followed by mastectomy. She is currently on Arimidex 1mg  daily and monthly Xgeva. 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 She received 6 cycles of CVP and Rituxan, followed by maintenance Rituxan. Later, her disease recurred and underwent 4 cycles of R-Bendamustine. PET/CT showed CR and then she proceeded to maintenance Rituxan.  #3 In April 2015, repeat CT scan show new lymphadenopathy. #4 on 04/03/2014, axillary lymph node dissection showed recurrence of CLL/SLL #5 On 04/18/14: She was started on Obinutuzumab #6 on 08/04/2014, repeat CT scan showed improvement of disease contour of the right axilla but with progression of liver metastasis & lung metastasis #7 on 08/15/2014, she was started on Faslodex. INTERVAL HISTORY: Please see below for problem oriented charting. She had mild nasal drainage and nonproductive cough. Denies worsening bone pain. She takes Percocet rarely for occasional arthritis pain. She complained of new skin rash on her thigh that is itchy.  REVIEW OF SYSTEMS:   Constitutional: Denies fevers, chills or abnormal weight loss Eyes: Denies blurriness of vision Ears, nose, mouth, throat, and face: Denies mucositis or sore throat Cardiovascular: Denies palpitation, chest discomfort or lower extremity swelling Gastrointestinal:  Denies nausea, heartburn or change in bowel habits Lymphatics: Denies new lymphadenopathy or easy bruising Neurological:Denies numbness, tingling or new  weaknesses Behavioral/Psych: Mood is stable, no new changes  All other systems were reviewed with the patient and are negative.  I have reviewed the past medical history, past surgical history, social history and family history with the patient and they are unchanged from previous note.  ALLERGIES:  is allergic to codeine; penicillins; sulfonamide derivatives; tape; and tramadol hcl.  MEDICATIONS:  Current Outpatient Prescriptions  Medication Sig Dispense Refill  . calcium carbonate 200 MG capsule Take 200 mg by mouth daily.    . fulvestrant (FASLODEX) 250 MG/5ML injection Inject 500 mg into the muscle every 14 (fourteen) days. One injection each buttock over 1-2 minutes. Warm prior to use.    [name] HYDROcodone-acetaminophen (NORCO) 5-325 MG per tablet Take 1 tablet by mouth every 6 (six) hours as needed for moderate pain. 60 tablet 0  . guaiFENesin-dextromethorphan (ROBITUSSIN DM) 100-10 MG/5ML syrup Take 5 mLs by mouth every 4 (four) hours as needed for cough. 354 mL 0   No current facility-administered medications for this visit.    PHYSICAL EXAMINATION: ECOG PERFORMANCE STATUS: 1 - Symptomatic but completely ambulatory  Filed Vitals:   10/10/14 1053  BP: 155/60  Pulse: 64  Temp: 97.6 F (36.4 C)  Resp: 18   Filed Weights   10/10/14 1053  Weight: 140 lb 3.2 oz (63.594 kg)    GENERAL:alert, no distress and comfortable SKIN: Noted dry skin. Mild dermatitis is noted on her thigh noted but is not compatible with shingles. EYES: normal, Conjunctiva are pink and non-injected, sclera clear OROPHARYNX:no exudate, no erythema and lips, buccal mucosa, and tongue normal  NECK: supple, thyroid normal size, non-tender, without nodularity LYMPH:  no palpable lymphadenopathy in the cervical, axillary or inguinal LUNGS: clear to auscultation and percussion with normal breathing effort HEART: regular rate & rhythm and no murmurs and no lower  extremity edema ABDOMEN:abdomen soft, non-tender  and normal bowel sounds Musculoskeletal:no cyanosis of digits and no clubbing  NEURO: alert & oriented x 3 with fluent speech, no focal motor/sensory deficits  LABORATORY DATA:  I have reviewed the data as listed    Component Value Date/Time   NA 141 09/12/2014 1020   NA 140 06/13/2014 0819   NA 142 06/01/2012 1008   K 4.3 09/12/2014 1020   K 4.0 06/13/2014 0819   K 3.8 06/01/2012 1008   CL 101 06/13/2014 0819   CL 107 04/22/2013 0922   CL 103 06/01/2012 1008   CO2 26 09/12/2014 1020   CO2 25 06/13/2014 0819   CO2 25 06/01/2012 1008   GLUCOSE 94 09/12/2014 1020   GLUCOSE 98 06/13/2014 0819   GLUCOSE 89 04/22/2013 0922   GLUCOSE 108 06/01/2012 1008   BUN 13.1 09/12/2014 1020   BUN 13 06/13/2014 0819   BUN 9 06/01/2012 1008   CREATININE 0.8 09/12/2014 1020   CREATININE 0.81 06/13/2014 0819   CREATININE 0.9 06/01/2012 1008   CALCIUM 10.3 09/12/2014 1020   CALCIUM 10.8* 06/13/2014 0819   CALCIUM 9.8 06/01/2012 1008   PROT 6.7 09/12/2014 1020   PROT 7.5 06/13/2014 0819   PROT 8.1 08/14/2010 1251   ALBUMIN 3.9 09/12/2014 1020   ALBUMIN 3.9 06/13/2014 0819   AST 20 09/12/2014 1020   AST 23 06/13/2014 0819   AST 19 08/14/2010 1251   ALT 15 09/12/2014 1020   ALT 13 06/13/2014 0819   ALT 9* 08/14/2010 1251   ALKPHOS 86 09/12/2014 1020   ALKPHOS 89 06/13/2014 0819   ALKPHOS 78 08/14/2010 1251   BILITOT 0.20 09/12/2014 1020   BILITOT 0.2* 06/13/2014 0819   BILITOT 0.40 08/14/2010 1251   GFRNONAA 87* 07/06/2012 1122   GFRAA >90 07/06/2012 1122    No results found for: SPEP, UPEP  Lab Results  Component Value Date   WBC 4.9 10/10/2014   NEUTROABS 3.5 10/10/2014   HGB 11.4* 10/10/2014   HCT 35.8 10/10/2014   MCV 86.9 10/10/2014   PLT 235 10/10/2014      Chemistry      Component Value Date/Time   NA 141 09/12/2014 1020   NA 140 06/13/2014 0819   NA 142 06/01/2012 1008   K 4.3 09/12/2014 1020   K 4.0 06/13/2014 0819   K 3.8 06/01/2012 1008   CL 101 06/13/2014  0819   CL 107 04/22/2013 0922   CL 103 06/01/2012 1008   CO2 26 09/12/2014 1020   CO2 25 06/13/2014 0819   CO2 25 06/01/2012 1008   BUN 13.1 09/12/2014 1020   BUN 13 06/13/2014 0819   BUN 9 06/01/2012 1008   CREATININE 0.8 09/12/2014 1020   CREATININE 0.81 06/13/2014 0819   CREATININE 0.9 06/01/2012 1008      Component Value Date/Time   CALCIUM 10.3 09/12/2014 1020   CALCIUM 10.8* 06/13/2014 0819   CALCIUM 9.8 06/01/2012 1008   ALKPHOS 86 09/12/2014 1020   ALKPHOS 89 06/13/2014 0819   ALKPHOS 78 08/14/2010 1251   AST 20 09/12/2014 1020   AST 23 06/13/2014 0819   AST 19 08/14/2010 1251   ALT 15 09/12/2014 1020   ALT 13 06/13/2014 0819   ALT 9* 08/14/2010 1251   BILITOT 0.20 09/12/2014 1020   BILITOT 0.2* 06/13/2014 0819   BILITOT 0.40 08/14/2010 1251    ASSESSMENT & PLAN:  Cancer of left breast She is not symptomatic. This is likely due to recurrence  of breast cancer She will continue her Faslodex injection monthly. I will check her tumor markers and plan to repeat imaging next month before she returns.   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 Continue observation.  Poor dentition She need to be started on XGeva the but had problems with dental issues in the past. I recommend dental evaluation as soon as possible and will refer her to our dentist for assessment. Appointment is pending next month.      Cough This is related to allergic rhinitis. I recommend over-the-counter, suppressive medicine with nasal decongestant. I gave her prescription today.  Anemia in neoplastic disease This is likely anemia of chronic disease. The patient denies recent history of bleeding such as epistaxis, hematuria or hematochezia. She is asymptomatic from the anemia. We will observe for now.    Skin rash I have reassured the patient is not shingles. She will continue topical emollient cream.   Orders Placed This Encounter  Procedures  . CT Chest W  Contrast    Standing Status: Future     Number of Occurrences:      Standing Expiration Date: 12/10/2015    Order Specific Question:  Reason for Exam (SYMPTOM  OR DIAGNOSIS REQUIRED)    Answer:  staging breast ca, assess response to RX    Order Specific Question:  Preferred imaging location?    Answer:  [facility]  . CT Abdomen Pelvis W Contrast    Standing Status: Future     Number of Occurrences:      Standing Expiration Date: 01/10/2016    Order Specific Question:  Reason for Exam (SYMPTOM  OR DIAGNOSIS REQUIRED)    Answer:  staging breast ca, assess response to Rx    Order Specific Question:  Preferred imaging location?    Answer:  [facility] 27.29    Standing Status: Future     Number of Occurrences:      Standing Expiration Date: 11/14/2015  . Cancer antigen 15-3    Standing Status: Future     Number of Occurrences:      Standing Expiration Date: 11/14/2015   All questions were answered. The patient knows to call the clinic with any problems, questions or concerns. No barriers to learning was detected. I spent 30 minutes counseling the patient face to face. The total time spent in the appointment was 40 minutes and more than 50% was on counseling and review of test results     [facility], [place], [place] 10/10/2014 11:16 AM

## 2014-10-10 NOTE — Assessment & Plan Note (Signed)
I have reassured the patient is not shingles. She will continue topical emollient cream.

## 2014-10-10 NOTE — Assessment & Plan Note (Signed)
CLL appears to have responded to treatment. Continue observation.

## 2014-10-10 NOTE — Telephone Encounter (Signed)
Pt confirmed labs/ov per 12/08 POF, gave pt AVS.... KJ, gave pt barium °

## 2014-10-10 NOTE — Patient Instructions (Signed)
Fulvestrant injection  What is this medicine?  FULVESTRANT (ful VES trant) blocks the effects of estrogen. It is used to treat breast cancer in women past the age of menopause.  This medicine may be used for other purposes; ask your health care provider or pharmacist if you have questions.  COMMON BRAND NAME(S): FASLODEX  What should I tell my health care provider before I take this medicine?  They need to know if you have any of these conditions:  -bleeding problems  -liver disease  -low levels of platelets in the blood  -an unusual or allergic reaction to fulvestrant, other medicines, foods, dyes, or preservatives  -pregnant or trying to get pregnant  -breast-feeding  How should I use this medicine?  This medicine is for injection into a muscle. It is usually given by a health care professional in a hospital or clinic setting.  Talk to your pediatrician regarding the use of this medicine in children. Special care may be needed.  Overdosage: If you think you have taken too much of this medicine contact a poison control center or emergency room at once.  NOTE: This medicine is only for you. Do not share this medicine with others.  What if I miss a dose?  It is important not to miss your dose. Call your doctor or health care professional if you are unable to keep an appointment.  What may interact with this medicine?  -medicines that treat or prevent blood clots like warfarin, enoxaparin, and dalteparin  This list may not describe all possible interactions. Give your health care provider a list of all the medicines, herbs, non-prescription drugs, or dietary supplements you use. Also tell them if you smoke, drink alcohol, or use illegal drugs. Some items may interact with your medicine.  What should I watch for while using this medicine?  Your condition will be monitored carefully while you are receiving this medicine. You will need important blood work done while you are taking this medicine.  Do not become pregnant  while taking this medicine. Women should inform their doctor if they wish to become pregnant or think they might be pregnant. There is a potential for serious side effects to an unborn child. Talk to your health care professional or pharmacist for more information.  What side effects may I notice from receiving this medicine?  Side effects that you should report to your doctor or health care professional as soon as possible:  -allergic reactions like skin rash, itching or hives, swelling of the face, lips, or tongue  -feeling faint or lightheaded, falls  -fever or flu-like symptoms  -sore throat  -vaginal bleeding  Side effects that usually do not require medical attention (report to your doctor or health care professional if they continue or are bothersome):  -aches, pains  -constipation or diarrhea  -headache  -hot flashes  -nausea, vomiting  -pain at site where injected  -stomach pain  This list may not describe all possible side effects. Call your doctor for medical advice about side effects. You may report side effects to FDA at 1-800-FDA-1088.  Where should I keep my medicine?  This drug is given in a hospital or clinic and will not be stored at home.  NOTE: This sheet is a summary. It may not cover all possible information. If you have questions about this medicine, talk to your doctor, pharmacist, or health care provider.   2015, Elsevier/Gold Standard. (2008-02-28 15:39:24)

## 2014-10-10 NOTE — Assessment & Plan Note (Signed)
She need to be started on XGeva the but had problems with dental issues in the past. I recommend dental evaluation as soon as possible and will refer her to our dentist for assessment. Appointment is pending next month.

## 2014-10-10 NOTE — Assessment & Plan Note (Signed)
This is likely anemia of chronic disease. The patient denies recent history of bleeding such as epistaxis, hematuria or hematochezia. She is asymptomatic from the anemia. We will observe for now.  

## 2014-10-11 ENCOUNTER — Ambulatory Visit (HOSPITAL_COMMUNITY): Payer: Self-pay | Admitting: Dentistry

## 2014-10-11 LAB — CANCER ANTIGEN 27.29: CA 27.29: 253 U/mL — ABNORMAL HIGH (ref 0–39)

## 2014-11-06 ENCOUNTER — Ambulatory Visit (HOSPITAL_COMMUNITY)
Admission: RE | Admit: 2014-11-06 | Discharge: 2014-11-06 | Disposition: A | Discharge status: Home / Self Care | Payer: Medicare Other | Source: Ambulatory Visit | Attending: Hematology and Oncology | Admitting: Hematology and Oncology

## 2014-11-06 ENCOUNTER — Encounter (HOSPITAL_COMMUNITY): Payer: Self-pay

## 2014-11-06 ENCOUNTER — Other Ambulatory Visit (HOSPITAL_BASED_OUTPATIENT_CLINIC_OR_DEPARTMENT_OTHER): Payer: Medicare Other

## 2014-11-06 DIAGNOSIS — K5730 Diverticulosis of large intestine without perforation or abscess without bleeding: Secondary | ICD-10-CM | POA: Insufficient documentation

## 2014-11-06 DIAGNOSIS — C9590 Leukemia, unspecified not having achieved remission: Secondary | ICD-10-CM | POA: Insufficient documentation

## 2014-11-06 DIAGNOSIS — C8590 Non-Hodgkin lymphoma, unspecified, unspecified site: Secondary | ICD-10-CM | POA: Diagnosis not present

## 2014-11-06 DIAGNOSIS — C7951 Secondary malignant neoplasm of bone: Secondary | ICD-10-CM | POA: Diagnosis not present

## 2014-11-06 DIAGNOSIS — C50912 Malignant neoplasm of unspecified site of left female breast: Secondary | ICD-10-CM | POA: Diagnosis not present

## 2014-11-06 DIAGNOSIS — J479 Bronchiectasis, uncomplicated: Secondary | ICD-10-CM | POA: Insufficient documentation

## 2014-11-06 DIAGNOSIS — J701 Chronic and other pulmonary manifestations due to radiation: Secondary | ICD-10-CM | POA: Diagnosis not present

## 2014-11-06 DIAGNOSIS — C787 Secondary malignant neoplasm of liver and intrahepatic bile duct: Secondary | ICD-10-CM | POA: Insufficient documentation

## 2014-11-06 DIAGNOSIS — R599 Enlarged lymph nodes, unspecified: Secondary | ICD-10-CM | POA: Diagnosis not present

## 2014-11-06 DIAGNOSIS — K8020 Calculus of gallbladder without cholecystitis without obstruction: Secondary | ICD-10-CM | POA: Insufficient documentation

## 2014-11-06 DIAGNOSIS — Z9012 Acquired absence of left breast and nipple: Secondary | ICD-10-CM | POA: Diagnosis not present

## 2014-11-06 DIAGNOSIS — Z9221 Personal history of antineoplastic chemotherapy: Secondary | ICD-10-CM | POA: Diagnosis not present

## 2014-11-06 DIAGNOSIS — C7800 Secondary malignant neoplasm of unspecified lung: Secondary | ICD-10-CM | POA: Insufficient documentation

## 2014-11-06 DIAGNOSIS — D259 Leiomyoma of uterus, unspecified: Secondary | ICD-10-CM | POA: Diagnosis not present

## 2014-11-06 LAB — CBC WITH DIFFERENTIAL/PLATELET
BASO%: 0.6 % (ref 0.0–2.0)
Basophils Absolute: 0 10*3/uL (ref 0.0–0.1)
EOS ABS: 0.2 10*3/uL (ref 0.0–0.5)
EOS%: 4.9 % (ref 0.0–7.0)
HCT: 37 % (ref 34.8–46.6)
HGB: 11.8 g/dL (ref 11.6–15.9)
LYMPH%: 14 % (ref 14.0–49.7)
MCH: 28.1 pg (ref 25.1–34.0)
MCHC: 31.9 g/dL (ref 31.5–36.0)
MCV: 88.1 fL (ref 79.5–101.0)
MONO#: 0.6 10*3/uL (ref 0.1–0.9)
MONO%: 13.5 % (ref 0.0–14.0)
NEUT#: 3.1 10*3/uL (ref 1.5–6.5)
NEUT%: 67 % (ref 38.4–76.8)
Platelets: 220 10*3/uL (ref 145–400)
RBC: 4.2 10*6/uL (ref 3.70–5.45)
RDW: 14.5 % (ref 11.2–14.5)
WBC: 4.7 10*3/uL (ref 3.9–10.3)
lymph#: 0.7 10*3/uL — ABNORMAL LOW (ref 0.9–3.3)

## 2014-11-06 LAB — COMPREHENSIVE METABOLIC PANEL (CC13)
ALBUMIN: 4.3 g/dL (ref 3.5–5.0)
ALT: 17 U/L (ref 0–55)
AST: 25 U/L (ref 5–34)
Alkaline Phosphatase: 83 U/L (ref 40–150)
Anion Gap: 9 mEq/L (ref 3–11)
BILIRUBIN TOTAL: 0.36 mg/dL (ref 0.20–1.20)
BUN: 9.4 mg/dL (ref 7.0–26.0)
CHLORIDE: 103 meq/L (ref 98–109)
CO2: 28 mEq/L (ref 22–29)
Calcium: 10.4 mg/dL (ref 8.4–10.4)
Creatinine: 0.8 mg/dL (ref 0.6–1.1)
EGFR: 80 mL/min/{1.73_m2} — AB (ref >90)
GLUCOSE: 77 mg/dL (ref 70–140)
POTASSIUM: 4.4 meq/L (ref 3.5–5.1)
SODIUM: 140 meq/L (ref 136–145)
Total Protein: 7.2 g/dL (ref 6.4–8.3)

## 2014-11-06 MED ORDER — IOHEXOL 300 MG/ML  SOLN
100.0000 mL | Freq: Once | INTRAMUSCULAR | Status: AC | PRN
  Administered 2014-11-06: 100 mL via INTRAVENOUS

## 2014-11-07 ENCOUNTER — Ambulatory Visit (HOSPITAL_BASED_OUTPATIENT_CLINIC_OR_DEPARTMENT_OTHER): Payer: Medicare Other

## 2014-11-07 ENCOUNTER — Ambulatory Visit (HOSPITAL_BASED_OUTPATIENT_CLINIC_OR_DEPARTMENT_OTHER): Payer: Medicare Other | Admitting: Hematology and Oncology

## 2014-11-07 ENCOUNTER — Other Ambulatory Visit: Payer: Self-pay | Admitting: Hematology and Oncology

## 2014-11-07 ENCOUNTER — Telehealth: Payer: Self-pay | Admitting: Hematology and Oncology

## 2014-11-07 ENCOUNTER — Other Ambulatory Visit: Payer: Self-pay | Admitting: *Deleted

## 2014-11-07 VITALS — BP 118/91 | HR 65 | Temp 97.5°F | Resp 18 | Ht 62.0 in | Wt 138.3 lb

## 2014-11-07 DIAGNOSIS — C8300 Small cell B-cell lymphoma, unspecified site: Secondary | ICD-10-CM

## 2014-11-07 DIAGNOSIS — C50912 Malignant neoplasm of unspecified site of left female breast: Secondary | ICD-10-CM

## 2014-11-07 DIAGNOSIS — C7951 Secondary malignant neoplasm of bone: Secondary | ICD-10-CM

## 2014-11-07 DIAGNOSIS — C7800 Secondary malignant neoplasm of unspecified lung: Secondary | ICD-10-CM

## 2014-11-07 DIAGNOSIS — Z5111 Encounter for antineoplastic chemotherapy: Secondary | ICD-10-CM

## 2014-11-07 LAB — CANCER ANTIGEN 27.29: CA 27.29: 275 U/mL — AB (ref 0–39)

## 2014-11-07 MED ORDER — FULVESTRANT 250 MG/5ML IM SOLN
500.0000 mg | INTRAMUSCULAR | Status: DC
  Administered 2014-11-07: 500 mg via INTRAMUSCULAR
  Filled 2014-11-07: qty 10

## 2014-11-07 MED ORDER — HYDROCODONE-ACETAMINOPHEN 5-325 MG PO TABS: 1.0000 | ORAL_TABLET | Freq: Four times a day (QID) | ORAL | Status: DC | PRN

## 2014-11-07 NOTE — Telephone Encounter (Signed)
gv adn printed appt sched and avs for pt for Feb and March 2016 °

## 2014-11-07 NOTE — Patient Instructions (Signed)
Fulvestrant injection  What is this medicine?  FULVESTRANT (ful VES trant) blocks the effects of estrogen. It is used to treat breast cancer in women past the age of menopause.  This medicine may be used for other purposes; ask your health care provider or pharmacist if you have questions.  COMMON BRAND NAME(S): FASLODEX  What should I tell my health care provider before I take this medicine?  They need to know if you have any of these conditions:  -bleeding problems  -liver disease  -low levels of platelets in the blood  -an unusual or allergic reaction to fulvestrant, other medicines, foods, dyes, or preservatives  -pregnant or trying to get pregnant  -breast-feeding  How should I use this medicine?  This medicine is for injection into a muscle. It is usually given by a health care professional in a hospital or clinic setting.  Talk to your pediatrician regarding the use of this medicine in children. Special care may be needed.  Overdosage: If you think you have taken too much of this medicine contact a poison control center or emergency room at once.  NOTE: This medicine is only for you. Do not share this medicine with others.  What if I miss a dose?  It is important not to miss your dose. Call your doctor or health care professional if you are unable to keep an appointment.  What may interact with this medicine?  -medicines that treat or prevent blood clots like warfarin, enoxaparin, and dalteparin  This list may not describe all possible interactions. Give your health care provider a list of all the medicines, herbs, non-prescription drugs, or dietary supplements you use. Also tell them if you smoke, drink alcohol, or use illegal drugs. Some items may interact with your medicine.  What should I watch for while using this medicine?  Your condition will be monitored carefully while you are receiving this medicine. You will need important blood work done while you are taking this medicine.  Do not become pregnant  while taking this medicine. Women should inform their doctor if they wish to become pregnant or think they might be pregnant. There is a potential for serious side effects to an unborn child. Talk to your health care professional or pharmacist for more information.  What side effects may I notice from receiving this medicine?  Side effects that you should report to your doctor or health care professional as soon as possible:  -allergic reactions like skin rash, itching or hives, swelling of the face, lips, or tongue  -feeling faint or lightheaded, falls  -fever or flu-like symptoms  -sore throat  -vaginal bleeding  Side effects that usually do not require medical attention (report to your doctor or health care professional if they continue or are bothersome):  -aches, pains  -constipation or diarrhea  -headache  -hot flashes  -nausea, vomiting  -pain at site where injected  -stomach pain  This list may not describe all possible side effects. Call your doctor for medical advice about side effects. You may report side effects to FDA at 1-800-FDA-1088.  Where should I keep my medicine?  This drug is given in a hospital or clinic and will not be stored at home.  NOTE: This sheet is a summary. It may not cover all possible information. If you have questions about this medicine, talk to your doctor, pharmacist, or health care provider.   2015, Elsevier/Gold Standard. (2008-02-28 15:39:24)

## 2014-11-07 NOTE — Telephone Encounter (Signed)
lvm fo rpt regarding to added visit on 2.2.15...mailed pt appt sched and letter

## 2014-11-08 ENCOUNTER — Encounter: Payer: Self-pay | Admitting: Radiation Oncology

## 2014-11-08 ENCOUNTER — Ambulatory Visit
Admission: RE | Admit: 2014-11-08 | Discharge: 2014-11-08 | Disposition: A | Discharge status: Home / Self Care | Payer: Medicare Other | Source: Ambulatory Visit | Attending: Radiation Oncology | Admitting: Radiation Oncology

## 2014-11-08 ENCOUNTER — Ambulatory Visit (HOSPITAL_COMMUNITY): Payer: Medicaid - Dental | Admitting: Dentistry

## 2014-11-08 ENCOUNTER — Encounter (HOSPITAL_COMMUNITY): Payer: Self-pay | Admitting: Dentistry

## 2014-11-08 VITALS — BP 122/52 | HR 69 | Temp 98.1°F

## 2014-11-08 DIAGNOSIS — C50912 Malignant neoplasm of unspecified site of left female breast: Secondary | ICD-10-CM

## 2014-11-08 DIAGNOSIS — K0530 Chronic periodontitis, unspecified: Secondary | ICD-10-CM

## 2014-11-08 DIAGNOSIS — IMO0002 Reserved for concepts with insufficient information to code with codable children: Secondary | ICD-10-CM

## 2014-11-08 DIAGNOSIS — Z923 Personal history of irradiation: Secondary | ICD-10-CM | POA: Diagnosis not present

## 2014-11-08 DIAGNOSIS — K036 Deposits [accretions] on teeth: Secondary | ICD-10-CM

## 2014-11-08 DIAGNOSIS — C7800 Secondary malignant neoplasm of unspecified lung: Secondary | ICD-10-CM | POA: Diagnosis not present

## 2014-11-08 DIAGNOSIS — C787 Secondary malignant neoplasm of liver and intrahepatic bile duct: Secondary | ICD-10-CM | POA: Insufficient documentation

## 2014-11-08 DIAGNOSIS — K029 Dental caries, unspecified: Secondary | ICD-10-CM

## 2014-11-08 DIAGNOSIS — C8300 Small cell B-cell lymphoma, unspecified site: Secondary | ICD-10-CM | POA: Diagnosis not present

## 2014-11-08 DIAGNOSIS — M264 Malocclusion, unspecified: Secondary | ICD-10-CM

## 2014-11-08 DIAGNOSIS — C7951 Secondary malignant neoplasm of bone: Secondary | ICD-10-CM | POA: Insufficient documentation

## 2014-11-08 DIAGNOSIS — K001 Supernumerary teeth: Secondary | ICD-10-CM

## 2014-11-08 DIAGNOSIS — M2630 Unspecified anomaly of tooth position of fully erupted tooth or teeth: Secondary | ICD-10-CM

## 2014-11-08 DIAGNOSIS — C7952 Secondary malignant neoplasm of bone marrow: Secondary | ICD-10-CM

## 2014-11-08 NOTE — Assessment & Plan Note (Signed)
Her MRI appears somewhat stable and she has not much symptoms apart from chronic arthritis. I recommend review with radiation oncologist to see if any additional palliative radiation therapy would be beneficial to reduce risk of fracture. He recommended observation She need to be started on XGeva the but had problems with dental issues in the past. I recommend dental evaluation as soon as possible and will refer her to our dentist for assessment prior to the start of treatment.

## 2014-11-08 NOTE — Assessment & Plan Note (Signed)
She is not symptomatic. She will continue her Faslodex injection monthly.  The imaging study is hard and challenging to interpret. The pulmonary metastasis and liver metastasis are likely related to breast cancer and they are getting better. For that I recommend we continue the same treatment. Even though her tumor markers are mildly elevated, overall I felt that her disease is stable.

## 2014-11-08 NOTE — Progress Notes (Signed)
Radiation Oncology         [phone] ________________________________  Name: Helen [name] MRN: [id number]  Date: 11/08/2014  DOB: [birth year]  Follow-Up Visit Note  CC: No PCP Per Patient  [name], MD  Diagnosis:    Lymphoma, small lymphocytic   Primary site: Lymphoid Neoplasms   Staging method: AJCC 6th Edition   Clinical: Stage IV signed by [name], MD on 02/23/2014 10:03 PM   Summary: Stage IV Cancer of left breast   Primary site: Breast   Staging method: AJCC 6th Edition   Clinical: Stage IV (T4d, NX, M1) signed by [name], MD on 02/23/2014 10:02 PM   Summary: Stage IV (T4d, NX, M1)    Narrative:  The patient returns today for follow-up.  The patient states that her pain is overall has been fairly stable. She does continue to have some occasional pain in the right shoulder region. She did receive prior palliative radiation treatment to this site. She denies any significant increase in this pain recently. She denies any significant back pain or pelvic/hip pain currently. The patient had recent CT imaging which showed stability in terms of her osseous metastases. She is continuing with systemic management through medical oncology.  ALLERGIES:  is allergic to codeine; penicillins; sulfonamide derivatives; tape; and tramadol hcl.  Meds: Current Outpatient Prescriptions  Medication Sig Dispense Refill  . fulvestrant (FASLODEX) 250 MG/5ML injection Inject 500 mg into the muscle every 14 (fourteen) days. One injection each buttock over 1-2 minutes. Warm prior to use.    [name] guaiFENesin-dextromethorphan (ROBITUSSIN DM) 100-10 MG/5ML syrup Take 5 mLs by mouth every 4 (four) hours as needed for cough.    [name] HYDROcodone-acetaminophen (NORCO) 5-325 MG per tablet Take 1 tablet by mouth every 6 (six) hours as needed for moderate pain. 60 tablet 0   No current facility-administered medications for this encounter.    Physical Findings: The patient is in no acute distress. Patient  is alert and oriented.  height is 5\' 2"  (1.575 m) and weight is 140 lb (63.504 kg). Her oral temperature is 98 F (36.7 C). Her blood pressure is 138/70 and her pulse is 68. Her respiration is 20. .   The patient denied any pain on palpation throughout the spine/pelvis.   Lab Findings: Lab Results  Component Value Date   WBC 4.7 11/06/2014   HGB 11.8 11/06/2014   HCT 37.0 11/06/2014   MCV 88.1 11/06/2014   PLT 220 11/06/2014     Radiographic Findings: Ct Chest W Contrast  08/04/2014   CLINICAL DATA:  Followup chronic lymphocytic leukemia and lymphoma. Ongoing chemotherapy. Personal history of breast carcinoma. Abdominal pain.  EXAM: CT CHEST, ABDOMEN, AND PELVIS WITH CONTRAST  TECHNIQUE: Multidetector CT imaging of the chest, abdomen and pelvis was performed following the standard protocol during bolus administration of intravenous contrast.  CONTRAST:  134mL OMNIPAQUE IOHEXOL 300 MG/ML  SOLN  COMPARISON:  02/20/14  FINDINGS: CT CHEST FINDINGS  Mediastinum/Hilar Regions: 9 mm right paratracheal lymph node is stable. Mild increased lymphadenopathy seen in the right cardiophrenic angle, with the largest area measuring 12 mm in short axis on image 39 compared to 6 mm previously.  Other Thoracic Lymphadenopathy: Right axillary lymphadenopathy shows interval decrease, with largest node measuring 10 mm on image 22 compared to 15 mm previously. Other tiny sub-cm right pectoral lymph nodes remain stable. Previous left mastectomy again noted. No left axillary lymphadenopathy identified.  Lungs: Increased size and number of bilateral pulmonary nodules is demonstrated compared  to previous study. Index nodule in the right lower lobe measures 12 mm on image 32 compared to 5 mm previously. Another index nodule in the peripheral left lower lobe measures 11 mm on image 40 compared to 8 mm previously.  Pleura:  No evidence of effusion or mass.  Vascular/Cardiac:  No acute findings identified.  Musculoskeletal:  Lytic lesion involving the proximal right humerus appears stable. No other suspicious bone lesions seen within the thorax.  Other:  None.  CT ABDOMEN AND PELVIS FINDINGS  Hepatobiliary: Increased number and size of low attenuation liver masses is demonstrated compared to previous exam. Largest lesion in the right hepatic lobe currently measures 3.1 cm on image 49 compared with 2.1 cm previously. Cholelithiasis again seen, without evidence cholecystitis.  Pancreas: No mass, inflammatory changes, or other parenchymal abnormality identified.  Spleen:  Within normal limits in size and appearance.  Adrenal Glands:  No mass identified.  Kidneys/Urinary Tract: No masses identified. Small renal cysts again noted. No evidence of hydronephrosis.  Stomach/Bowel/Peritoneum: No evidence of wall thickening, mass, or obstruction.  Vascular/Lymphatic: No pathologically enlarged lymph nodes identified. No other significant abnormality identified.  Reproductive: Multiple small calcified uterine fibroids appears stable.  Other:  Tiny periumbilical hernia containing only fat is unchanged.  Musculoskeletal: Lytic bone lesions involving the lower lumbar spine and bilateral iliac wings appears stable.  IMPRESSION: Mild increase in bilateral pulmonary metastases and liver metastases.  Mixed response of thoracic adenopathy, with decreased right axillary lymphadenopathy but mild increase in adenopathy in right cardiophrenic angle.  Stable lytic bone lesions.  Stable incidental findings including cholelithiasis, small calcified uterine fibroids, and tiny periumbilical hernia.   Electronically Signed   By: Earle Gell M.D.   On: 08/04/2014 13:35   Ct Abdomen Pelvis W Contrast  08/04/2014   CLINICAL DATA:  Followup chronic lymphocytic leukemia and lymphoma. Ongoing chemotherapy. Personal history of breast carcinoma. Abdominal pain.  EXAM: CT CHEST, ABDOMEN, AND PELVIS WITH CONTRAST  TECHNIQUE: Multidetector CT imaging of the chest, abdomen  and pelvis was performed following the standard protocol during bolus administration of intravenous contrast.  CONTRAST:  169mL OMNIPAQUE IOHEXOL 300 MG/ML  SOLN  COMPARISON:  02/20/14  FINDINGS: CT CHEST FINDINGS  Mediastinum/Hilar Regions: 9 mm right paratracheal lymph node is stable. Mild increased lymphadenopathy seen in the right cardiophrenic angle, with the largest area measuring 12 mm in short axis on image 39 compared to 6 mm previously.  Other Thoracic Lymphadenopathy: Right axillary lymphadenopathy shows interval decrease, with largest node measuring 10 mm on image 22 compared to 15 mm previously. Other tiny sub-cm right pectoral lymph nodes remain stable. Previous left mastectomy again noted. No left axillary lymphadenopathy identified.  Lungs: Increased size and number of bilateral pulmonary nodules is demonstrated compared to previous study. Index nodule in the right lower lobe measures 12 mm on image 32 compared to 5 mm previously. Another index nodule in the peripheral left lower lobe measures 11 mm on image 40 compared to 8 mm previously.  Pleura:  No evidence of effusion or mass.  Vascular/Cardiac:  No acute findings identified.  Musculoskeletal: Lytic lesion involving the proximal right humerus appears stable. No other suspicious bone lesions seen within the thorax.  Other:  None.  CT ABDOMEN AND PELVIS FINDINGS  Hepatobiliary: Increased number and size of low attenuation liver masses is demonstrated compared to previous exam. Largest lesion in the right hepatic lobe currently measures 3.1 cm on image 49 compared with 2.1 cm previously. Cholelithiasis again seen, without  evidence cholecystitis.  Pancreas: No mass, inflammatory changes, or other parenchymal abnormality identified.  Spleen:  Within normal limits in size and appearance.  Adrenal Glands:  No mass identified.  Kidneys/Urinary Tract: No masses identified. Small renal cysts again noted. No evidence of hydronephrosis.   Stomach/Bowel/Peritoneum: No evidence of wall thickening, mass, or obstruction.  Vascular/Lymphatic: No pathologically enlarged lymph nodes identified. No other significant abnormality identified.  Reproductive: Multiple small calcified uterine fibroids appears stable.  Other:  Tiny periumbilical hernia containing only fat is unchanged.  Musculoskeletal: Lytic bone lesions involving the lower lumbar spine and bilateral iliac wings appears stable.  IMPRESSION: Mild increase in bilateral pulmonary metastases and liver metastases.  Mixed response of thoracic adenopathy, with decreased right axillary lymphadenopathy but mild increase in adenopathy in right cardiophrenic angle.  Stable lytic bone lesions.  Stable incidental findings including cholelithiasis, small calcified uterine fibroids, and tiny periumbilical hernia.   Electronically Signed   By: Earle Gell M.D.   On: 08/04/2014 13:35   Mr Total Spine Mets Screening  08/11/2014   CLINICAL DATA:  80 year old female current history non-Hodgkin's lymphoma undergoing chemotherapy, also personal history of breast cancer in 2009. Progressive liver and lung metastatic disease and Lytic bone lesions. Subsequent encounter.  EXAM: MRI TOTAL SPINE WITHOUT AND WITH CONTRAST  TECHNIQUE: Multisequence MR imaging of the spine from the cervical spine to the sacrum was performed prior to and following IV contrast administration for evaluation of spinal metastatic disease.  CONTRAST:  54mL MULTIHANCE GADOBENATE DIMEGLUMINE 529 MG/ML IV SOLN  COMPARISON:  CT chest abdomen pelvis 08/04/2014. Lumbar MRI 01/18/2014, and earlier.  FINDINGS: Cervical Findings:  No definite bony metastatic disease in the cervical spine. Degenerative endplate changes felt responsible for mild marrow heterogeneity at the anterior inferior C5 endplate, with adjacent degenerative disc osteophyte complex. No abnormal enhancement in the cervical spine. Cervical spinal cord signal within normal limits. No  abnormal intradural enhancement identified.  Patchy nonspecific T2 hyperintensity in the pons without enhancement, most commonly due to chronic small vessel disease.  Thoracic Findings:  Normal thoracic bone marrow signal from the T1 to the T10 levels. No marrow edema or vertebral metastases at these levels. Thoracic spinal cord corresponding to these levels is normal. No abnormal thoracic intradural enhancement.  T11: Stable to mildly progressed spinous process metastasis in the midline posteriorly (series 12, image 9). More of the spinous process appears replaced by tumor today. Still, no dural or epidural extension identified.  T12: Progressed size of a metastasis within the left T12 superior articulating facet (series 9, image 12, series 10 and series 12, image 12. This lesion now measures 15 mm CC, previously 12 mm. No extraosseous extension of tumor identified.  Note that these levels were numbered differently on prior studies which only included the lumbar spine (transitional anatomy, see lumbar section below).  Normal conus medullaris at this level.  Lumbar Findings:  Different numbering system used on the lumbar and lower thoracic levels than on prior studies from March and earlier. There is transitional anatomy with sacralized L5 level, also confirmed on the CT 08/04/2014. Correlation with radiographs is recommended prior to any treatment intervention.  Chronic enhancing metastasis occupying most of the central L3 vertebral body (designated is L4 on prior studies) has not significantly changed in overall size. There is early ventral epidural extension on the left (series 17, image 28). No malignant neural impingement.  Underlying L4-L5 spondylolysis and spondylolisthesis (previously designated the L5-S1 level), with interval regressed signal abnormality in the  L4 inferior endplate which may be degenerative rather than malignant. Stable associated degenerative spinal and foraminal stenosis at this level.   Sacralized L5 level and visible sacrum appear free of tumor. There is tumor replacement of the medial iliac bones bilaterally seen on series 17, images 37-40.  IMPRESSION: 1. 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 Subsequently, lower thoracic and lumbar metastases are numbered differently today than on prior studies. 2. Mildly progressed T11 and T12 posterior element metastases without epidural or extraosseous extension. 3. Mildly progressed chronic L3 vertebral body metastasis with early epidural extension on the left. No malignant neural impingement. 4. No spinal metastasis identified in the cervical or upper thoracic segments. 5. Advanced chronic degenerative changes at L4-L5 with spondylolisthesis and degenerative spinal and foraminal stenosis.   Electronically Signed   By: Lars Pinks M.D.   On: 08/11/2014 15:41    Impression:    I discussed potential palliative radiation treatment with the patient once again. Given her degree of symptoms today, without significant change, and based on her recent CT imaging I would not recommend any palliative radiation treatment at the current time. We discussed the rationale of this recommendation. I will continue to follow the patient and certainly she can be seen sooner if needed.  Plan:  The patient will follow-up in our clinic in 4-6 months.  I spent 15 minutes with the patient today, the majority of which was spent counseling the patient on the diagnosis of cancer and coordinating care.   Jodelle Gross, M.D., Ph.D.

## 2014-11-08 NOTE — Progress Notes (Signed)
[facility] OFFICE PROGRESS NOTE  Patient Care Team: No Pcp Per Patient as PCP - General (General Practice) [name], MD as Consulting Physician (Hematology and Oncology)  SUMMARY OF ONCOLOGIC HISTORY:   Cancer of left breast   08/03/2008 Initial Diagnosis Breast CA   11/06/2014 Imaging Repeat CT scan of the chest, abdomen and pelvis show regression in the size of liver metastasis and pulmonary metastasis with mild progression of lymphadenopathy   11/06/2014 Tumor Marker [id number] is elevated at 275    #1 left breast carcinoma originally presenting as a fungating mass and was diagnosed with metastatic breast cancer in October 2009. Patient underwent neoadjuvant chemotherapy followed by mastectomy. She is currently on Arimidex 1mg  daily and monthly Xgeva. 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 She received 6 cycles of CVP and Rituxan, followed by maintenance Rituxan. Later, her disease recurred and underwent 4 cycles of R-Bendamustine. PET/CT showed CR and then she proceeded to maintenance Rituxan.  #3 In April 2015, repeat CT scan show new lymphadenopathy. #4 on 04/03/2014, axillary lymph node dissection showed recurrence of CLL/SLL #5 On 04/18/14: She was started on Obinutuzumab #6 on 08/04/2014, repeat CT scan showed improvement of disease contour of the right axilla but with progression of liver metastasis & lung metastasis #7 on 08/15/2014, she was started on Faslodex. INTERVAL HISTORY: Please see below for problem oriented charting. She feels well. Denies worsening back pain. No new neurological deficit. She tolerated Faslodex well without any signs and symptoms of hot flashes so poor tolerance. REVIEW OF SYSTEMS:   Constitutional: Denies fevers, chills or abnormal weight loss Eyes: Denies blurriness of vision Ears, nose, mouth, throat, and face: Denies mucositis or sore  throat Respiratory: Denies cough, dyspnea or wheezes Cardiovascular: Denies palpitation, chest discomfort or lower extremity swelling Gastrointestinal:  Denies nausea, heartburn or change in bowel habits Skin: Denies abnormal skin rashes Lymphatics: Denies new lymphadenopathy or easy bruising Neurological:Denies numbness, tingling or new weaknesses Behavioral/Psych: Mood is stable, no new changes  All other systems were reviewed with the patient and are negative.  I have reviewed the past medical history, past surgical history, social history and family history with the patient and they are unchanged from previous note.  ALLERGIES:  is allergic to codeine; penicillins; sulfonamide derivatives; tape; and tramadol hcl.  MEDICATIONS:  Current Outpatient Prescriptions  Medication Sig Dispense Refill  . HYDROcodone-acetaminophen (NORCO) 5-325 MG per tablet Take 1 tablet by mouth every 6 (six) hours as needed for moderate pain. 60 tablet 0  . fulvestrant (FASLODEX) 250 MG/5ML injection Inject 500 mg into the muscle every 14 (fourteen) days. One injection each buttock over 1-2 minutes. Warm prior to use.     No current facility-administered medications for this visit.    PHYSICAL EXAMINATION: ECOG PERFORMANCE STATUS: 0 - Asymptomatic  Filed Vitals:   11/07/14 0849  BP: 118/91  Pulse: 65  Temp: 97.5 F (36.4 C)  Resp: 18   Filed Weights   11/07/14 0849  Weight: 138 lb 4.8 oz (62.732 kg)    GENERAL:alert, no distress and comfortable SKIN: skin color, texture, turgor are normal, no rashes or significant lesions EYES: normal, Conjunctiva are pink and non-injected, sclera clear OROPHARYNX:no exudate, no erythema and lips, buccal mucosa, and tongue normal  NECK: supple, thyroid normal size, non-tender, without nodularity LYMPH:  no palpable lymphadenopathy in the cervical, axillary or inguinal LUNGS: clear to auscultation and percussion with normal breathing effort  HEART: regular  rate & rhythm and no murmurs and no lower extremity edema ABDOMEN:abdomen soft, non-tender and normal bowel sounds Musculoskeletal:no cyanosis of digits and no clubbing  NEURO: alert & oriented x 3 with fluent speech, no focal motor/sensory deficits Well-healed mastectomy scar. LABORATORY DATA:  I have reviewed the data as listed    Component Value Date/Time   NA 140 11/06/2014 1134   NA 140 06/13/2014 0819   NA 142 06/01/2012 1008   K 4.4 11/06/2014 1134   K 4.0 06/13/2014 0819   K 3.8 06/01/2012 1008   CL 101 06/13/2014 0819   CL 107 04/22/2013 0922   CL 103 06/01/2012 1008   CO2 28 11/06/2014 1134   CO2 25 06/13/2014 0819   CO2 25 06/01/2012 1008   GLUCOSE 77 11/06/2014 1134   GLUCOSE 98 06/13/2014 0819   GLUCOSE 89 04/22/2013 0922   GLUCOSE 108 06/01/2012 1008   BUN 9.4 11/06/2014 1134   BUN 13 06/13/2014 0819   BUN 9 06/01/2012 1008   CREATININE 0.8 11/06/2014 1134   CREATININE 0.81 06/13/2014 0819   CREATININE 0.9 06/01/2012 1008   CALCIUM 10.4 11/06/2014 1134   CALCIUM 10.8* 06/13/2014 0819   CALCIUM 9.8 06/01/2012 1008   PROT 7.2 11/06/2014 1134   PROT 7.5 06/13/2014 0819   PROT 8.1 08/14/2010 1251   ALBUMIN 4.3 11/06/2014 1134   ALBUMIN 3.9 06/13/2014 0819   AST 25 11/06/2014 1134   AST 23 06/13/2014 0819   AST 19 08/14/2010 1251   ALT 17 11/06/2014 1134   ALT 13 06/13/2014 0819   ALT 9* 08/14/2010 1251   ALKPHOS 83 11/06/2014 1134   ALKPHOS 89 06/13/2014 0819   ALKPHOS 78 08/14/2010 1251   BILITOT 0.36 11/06/2014 1134   BILITOT 0.2* 06/13/2014 0819   BILITOT 0.40 08/14/2010 1251   GFRNONAA 87* 07/06/2012 1122   GFRAA >90 07/06/2012 1122    No results found for: SPEP, UPEP  Lab Results  Component Value Date   WBC 4.7 11/06/2014   NEUTROABS 3.1 11/06/2014   HGB 11.8 11/06/2014   HCT 37.0 11/06/2014   MCV 88.1 11/06/2014   PLT 220 11/06/2014      Chemistry      Component Value Date/Time   NA 140 11/06/2014 1134   NA 140 06/13/2014 0819    NA 142 06/01/2012 1008   K 4.4 11/06/2014 1134   K 4.0 06/13/2014 0819   K 3.8 06/01/2012 1008   CL 101 06/13/2014 0819   CL 107 04/22/2013 0922   CL 103 06/01/2012 1008   CO2 28 11/06/2014 1134   CO2 25 06/13/2014 0819   CO2 25 06/01/2012 1008   BUN 9.4 11/06/2014 1134   BUN 13 06/13/2014 0819   BUN 9 06/01/2012 1008   CREATININE 0.8 11/06/2014 1134   CREATININE 0.81 06/13/2014 0819   CREATININE 0.9 06/01/2012 1008      Component Value Date/Time   CALCIUM 10.4 11/06/2014 1134   CALCIUM 10.8* 06/13/2014 0819   CALCIUM 9.8 06/01/2012 1008   ALKPHOS 83 11/06/2014 1134   ALKPHOS 89 06/13/2014 0819   ALKPHOS 78 08/14/2010 1251   AST 25 11/06/2014 1134   AST 23 06/13/2014 0819   AST 19 08/14/2010 1251   ALT 17 11/06/2014 1134   ALT 13 06/13/2014 0819   ALT 9* 08/14/2010 1251   BILITOT 0.36 11/06/2014 1134   BILITOT 0.2* 06/13/2014 0819   BILITOT 0.40 08/14/2010 1251       RADIOGRAPHIC STUDIES:  I have personally reviewed the radiological images as listed and agreed with the findings in the report. Ct Chest W Contrast  11/06/2014   CLINICAL DATA:  Lt breast ca dx 2009 lymphoma dx 2012 Leukemia dx 2012 restaging Chemo stopped lt mastectomy, appy  EXAM: CT CHEST, ABDOMEN, AND PELVIS WITH CONTRAST  TECHNIQUE: Multidetector CT imaging of the chest, abdomen and pelvis was performed following the standard protocol during bolus administration of intravenous contrast.  CONTRAST:  158mL OMNIPAQUE IOHEXOL 300 MG/ML  SOLN  COMPARISON:  08/04/2014  FINDINGS: CT CHEST FINDINGS  Status post left mastectomy. Stable scarring along the left lateral chest soft tissues extending to the left axilla. No evidence of locally recurrent left breast carcinoma.  Enlarged right axillary lymph nodes. Level 2 node measures 11 mm in short axis, previously sub cm. Nodes seen more laterally and inferiorly measures 22 mm x 12 mm, previously 17 x 10 mm.  No neck base masses or adenopathy. Heart top-normal in size.  Great vessels normal in caliber. Sub cm right peritracheal lymph nodes are stable. 12 mm short axis right subcarinal node is stable. No hilar masses or adenopathy.  Right cardiophrenic angle adenopathy is stable, largest node measuring 11 mm.  Post radiation fibrosis and traction bronchiectasis noted in the left upper lobe. Bilateral lung base/lower lobe coarse reticular and patchy opacity is likely combination scarring atelectasis. This has a somewhat nodular appearance in several locations. In the anterior right middle lobe a focal opacity measures 10 mm, previously 12 mm. In the peripheral left lower lobe focal opacity measures 11 mm x 7 mm, previously 15 mm x 11 mm. In the central left lower lobe opacity associated with the proximal lower lobe segmental bronchi measures 12 mm, previously 14 mm. Other areas of focal nodular opacity are either resolved for smaller. There are no new nodules or masses.  No pleural effusion.  CT ABDOMEN AND PELVIS FINDINGS  There are several liver masses. Largest arises from the posterior segment of the right lobe measuring approximately 27 mm x 24 mm, previously 31 mm x 22 mm. Other liver lesions noted previously are identified but are less well-defined. No convincing lesion enlargement and no new lesions.  Large gallstone measuring 2.5 cm, stable. Spleen and pancreas are unremarkable. No adrenal masses.  Stable bilateral low-density renal masses. Largest measures 18 mm arising from the lower pole of the left kidney, reflecting cysts. The smaller lesions are too small to fully characterize but are also likely cysts. These are stable. No other renal abnormality. Normal ureters. Normal bladder.  Stable small calcified uterine fibroids.  No adnexal masses.  Numerous colonic diverticula mostly along the sigmoid. No bowel wall thickening, masses or inflammatory change. Normal small bowel. No mesenteric masses or adenopathy.  New soft tissue within the left operator foramen measuring 27  mm x 12 mm. This is consistent with an enlarged lymph node. Normal size node noted in this location on the prior exam.  No other pathologically enlarged lymph nodes.  No ascites.  MUSCULOSKELETAL  Multiple lytic bone lesions are noted most evident in the pelvis, along the iliac wings with there are associated soft tissue masses. Lytic lesion also noted in the L4 vertebra. There is a lytic lesion in the right proximal humerus. Mild sclerosis is noted in the lower cervical spine and involving T1. These findings are stable. No new bone lesions.  IMPRESSION: 1. Mixed response to therapy with some increased adenopathy, but mild improvement in liver metastatic disease and improvement  in lung metastatic disease. 2. Lymph nodes in the right axilla have enlarged when compared the prior study. Left pelvic operator lymph node has enlarged when compared the prior exam. Mild mediastinal adenopathy is stable. 3. Focal lung opacities in lung nodules described previously are either smaller or resolved. No new lung nodules or masses. 4. Liver lesions are less well-defined on the current exam. The largest of these measures mildly smaller than it did on the prior study. The lesions may also be smaller. There are no new or enlarged liver lesions. 5. Osseous metastatic disease is without significant change. No evidence of bone lesions.   Electronically Signed   By: Lajean Manes M.D.   On: 11/06/2014 16:47   Ct Abdomen Pelvis W Contrast  11/06/2014   CLINICAL DATA:  Lt breast ca dx 2009 lymphoma dx 2012 Leukemia dx 2012 restaging Chemo stopped lt mastectomy, appy  EXAM: CT CHEST, ABDOMEN, AND PELVIS WITH CONTRAST  TECHNIQUE: Multidetector CT imaging of the chest, abdomen and pelvis was performed following the standard protocol during bolus administration of intravenous contrast.  CONTRAST:  144mL OMNIPAQUE IOHEXOL 300 MG/ML  SOLN  COMPARISON:  08/04/2014  FINDINGS: CT CHEST FINDINGS  Status post left mastectomy. Stable scarring along  the left lateral chest soft tissues extending to the left axilla. No evidence of locally recurrent left breast carcinoma.  Enlarged right axillary lymph nodes. Level 2 node measures 11 mm in short axis, previously sub cm. Nodes seen more laterally and inferiorly measures 22 mm x 12 mm, previously 17 x 10 mm.  No neck base masses or adenopathy. Heart top-normal in size. Great vessels normal in caliber. Sub cm right peritracheal lymph nodes are stable. 12 mm short axis right subcarinal node is stable. No hilar masses or adenopathy.  Right cardiophrenic angle adenopathy is stable, largest node measuring 11 mm.  Post radiation fibrosis and traction bronchiectasis noted in the left upper lobe. Bilateral lung base/lower lobe coarse reticular and patchy opacity is likely combination scarring atelectasis. This has a somewhat nodular appearance in several locations. In the anterior right middle lobe a focal opacity measures 10 mm, previously 12 mm. In the peripheral left lower lobe focal opacity measures 11 mm x 7 mm, previously 15 mm x 11 mm. In the central left lower lobe opacity associated with the proximal lower lobe segmental bronchi measures 12 mm, previously 14 mm. Other areas of focal nodular opacity are either resolved for smaller. There are no new nodules or masses.  No pleural effusion.  CT ABDOMEN AND PELVIS FINDINGS  There are several liver masses. Largest arises from the posterior segment of the right lobe measuring approximately 27 mm x 24 mm, previously 31 mm x 22 mm. Other liver lesions noted previously are identified but are less well-defined. No convincing lesion enlargement and no new lesions.  Large gallstone measuring 2.5 cm, stable. Spleen and pancreas are unremarkable. No adrenal masses.  Stable bilateral low-density renal masses. Largest measures 18 mm arising from the lower pole of the left kidney, reflecting cysts. The smaller lesions are too small to fully characterize but are also likely cysts.  These are stable. No other renal abnormality. Normal ureters. Normal bladder.  Stable small calcified uterine fibroids.  No adnexal masses.  Numerous colonic diverticula mostly along the sigmoid. No bowel wall thickening, masses or inflammatory change. Normal small bowel. No mesenteric masses or adenopathy.  New soft tissue within the left operator foramen measuring 27 mm x 12 mm. This is consistent with  an enlarged lymph node. Normal size node noted in this location on the prior exam.  No other pathologically enlarged lymph nodes.  No ascites.  MUSCULOSKELETAL  Multiple lytic bone lesions are noted most evident in the pelvis, along the iliac wings with there are associated soft tissue masses. Lytic lesion also noted in the L4 vertebra. There is a lytic lesion in the right proximal humerus. Mild sclerosis is noted in the lower cervical spine and involving T1. These findings are stable. No new bone lesions.  IMPRESSION: 1. Mixed response to therapy with some increased adenopathy, but mild improvement in liver metastatic disease and improvement in lung metastatic disease. 2. Lymph nodes in the right axilla have enlarged when compared the prior study. Left pelvic operator lymph node has enlarged when compared the prior exam. Mild mediastinal adenopathy is stable. 3. Focal lung opacities in lung nodules described previously are either smaller or resolved. No new lung nodules or masses. 4. Liver lesions are less well-defined on the current exam. The largest of these measures mildly smaller than it did on the prior study. The lesions may also be smaller. There are no new or enlarged liver lesions. 5. Osseous metastatic disease is without significant change. No evidence of bone lesions.   Electronically Signed   By: Lajean Manes M.D.   On: 11/06/2014 16:47     ASSESSMENT & PLAN:  Cancer of left breast She is not symptomatic. She will continue her Faslodex injection monthly.  The imaging study is hard and challenging  to interpret. The pulmonary metastasis and liver metastasis are likely related to breast cancer and they are getting better. For that I recommend we continue the same treatment. Even though her tumor markers are mildly elevated, overall I felt that her disease is stable.  Lymphoma, small lymphocytic Her scans show she had mild progressive lymphadenopathy. I suspect this is due to her CLL. Overall, I recommend observation for this.  Metastasis to bone Her MRI appears somewhat stable and she has not much symptoms apart from chronic arthritis. I recommend review with radiation oncologist to see if any additional palliative radiation therapy would be beneficial to reduce risk of fracture. He recommended observation She need to be started on XGeva the but had problems with dental issues in the past. I recommend dental evaluation as soon as possible and will refer her to our dentist for assessment prior to the start of treatment.   No orders of the defined types were placed in this encounter.   All questions were answered. The patient knows to call the clinic with any problems, questions or concerns. No barriers to learning was detected. I spent 30 minutes counseling the patient face to face. The total time spent in the appointment was 40 minutes and more than 50% was on counseling and review of test results     [facility], Jaquan Sadowsky, MD 11/08/2014 9:18 AM

## 2014-11-08 NOTE — Patient Instructions (Signed)
The patient understands that she is being referred to Dr. Tamela Oddi, oral surgeon, for evaluation for removal of tooth numbers 20, 21, and the extra tooth in the area #20/21. She also understands that she is been referred to [facility] for her periodontal therapy and dental restorations and evaluation for replacement of missing teeth as indicated. The patient was encouraged to brush her teeth after meals and at bedtime The patient is to call if she has problems before her appointments with the oral surgeon and new primary dentist.  Lenn Cal, DDS

## 2014-11-08 NOTE — Progress Notes (Signed)
Please see the Nurse Progress Note in the MD Initial Consult Encounter for this patient. 

## 2014-11-08 NOTE — Assessment & Plan Note (Signed)
Her scans show she had mild progressive lymphadenopathy. I suspect this is due to her CLL. Overall, I recommend observation for this.

## 2014-11-08 NOTE — Progress Notes (Signed)
DENTAL CONSULTATION  Date of Consultation:  11/08/2014 Patient Name:   Helen [name] Date of Birth:   01-06-1935 Medical Record Number: [id number]  VITALS: BP 122/52 mmHg  Pulse 69  Temp(Src) 98.1 F (36.7 C) (Oral)  CHIEF COMPLAINT: The patient was referred by Dr. Alvy Bimler for a pre-Xgeva dental examination.   HPI: Helen [name] is a 80 year old female with history of metastatic breast cancer to bone with multiple previous doses of Xgeva from 08/20/2012 through 03/18/2013. Patient was previously evaluated on 06/13/2013 and was referred to an oral surgeon, Dr. Tamela Oddi, for extraction of indicated teeth. The patient had teeth extracted during multiple appointments.  Tooth numbers 1, 3, and 30 were removed on 08/17/2013. Tooth numbers 14, 15, and 16 were extracted on 11/28/2013. Tooth numbers 17 and 18 were then removed on 02/01/2014. There were no apparent complications or problems with osteonecrosis of the jaws related to previous Xgeva therapy. The patient was then lost to follow-up and is now being referred by Dr. Alvy Bimler for a dental examination and treatment prior to restarting the [facility] therapy.  The patient currently denies acute toothaches, swellings, or abscesses. The patient last had dental treatment by Dr. Tamela Oddi for postoperative evaluation. Patient has not been seen by a regular dentist for an exam and cleaning in the interim. Prior to my consultation in August 2014, the patient had not been seen since the 1980's. The patient currently does not seek regular dental care nor does she have a primary dentist.   PROBLEM LIST: Patient Active Problem List   Diagnosis Date Noted  . Cough 10/10/2014  . Skin rash 10/10/2014  . Poor dentition 08/08/2014  . Pulmonary metastases 08/08/2014  . Liver metastasis 08/08/2014  . Anemia in neoplastic disease 05/09/2014  . Chest wall tenderness 05/09/2014  . Leukopenia due to antineoplastic chemotherapy 05/09/2014  .  History of ongoing treatment with hormonal therapy 01/03/2014  . Hx of radiation therapy   . Metastasis to bone 07/08/2012  . Other abnormal glucose 02/10/2012  . Osteopenia 02/10/2012  . Pure hypercholesterolemia 02/10/2012  . Routine general medical examination at a health care facility 02/10/2012  . Lymphoma, small lymphocytic 09/09/2011  . Postsurgical hypothyroidism 11/18/2010  . ALLERGIC RHINITIS 09/16/2010  . DEGENERATIVE JOINT DISEASE, BOTH KNEES, SEVERE 05/24/2009  . ASTEATOTIC ECZEMA 04/09/2009  . Cancer of left breast 08/03/2008    PMH: Past Medical History  Diagnosis Date  . Arthritis   . Blood transfusion 2009  . Hypertension   . Seizures 1980's    from medication reaction/Pt.  [name] Hx of radiation therapy 09/11/08 -10/31/08    left breast  . History of radiation therapy eot 07/30/12    lumbar spine L4 /l shoulder  . Cough 10/10/2014  . Breast CA 08/2008    (LT) breast ca dx 10/09/Chemo  . Lymphoma 09/09/2011    NHL  . Leukemia, acute, in remission 2012    Pt. not sure of type  . Metastasis to bone 07/03/2012    MRI L spine    PSH: Past Surgical History  Procedure Laterality Date  . Mastectomy    . Thyroidectomy  1965  . Appendectomy    . Tonsillectomy      ALLERGIES: Allergies  Allergen Reactions  . Codeine Other (See Comments)    Reaction: seizures  . Penicillins Other (See Comments)    Reaction: Seizures  . Sulfonamide Derivatives Itching  . Tape Rash  . Tramadol Hcl Rash    MEDICATIONS: Current Outpatient Prescriptions  Medication Sig Dispense Refill  . fulvestrant (FASLODEX) 250 MG/5ML injection Inject 500 mg into the muscle every 14 (fourteen) days. One injection each buttock over 1-2 minutes. Warm prior to use.    [name] HYDROcodone-acetaminophen (NORCO) 5-325 MG per tablet Take 1 tablet by mouth every 6 (six) hours as needed for moderate pain. 60 tablet 0   No current facility-administered medications for this visit.    LABS: Lab Results   Component Value Date   WBC 4.7 11/06/2014   HGB 11.8 11/06/2014   HCT 37.0 11/06/2014   MCV 88.1 11/06/2014   PLT 220 11/06/2014   BMET    Component Value Date/Time   NA 140 11/06/2014 1134   NA 140 06/13/2014 0819   NA 142 06/01/2012 1008   K 4.4 11/06/2014 1134   K 4.0 06/13/2014 0819   K 3.8 06/01/2012 1008   CL 101 06/13/2014 0819   CL 107 04/22/2013 0922   CL 103 06/01/2012 1008   CO2 28 11/06/2014 1134   CO2 25 06/13/2014 0819   CO2 25 06/01/2012 1008   GLUCOSE 77 11/06/2014 1134   GLUCOSE 98 06/13/2014 0819   GLUCOSE 89 04/22/2013 0922   GLUCOSE 108 06/01/2012 1008   BUN 9.4 11/06/2014 1134   BUN 13 06/13/2014 0819   BUN 9 06/01/2012 1008   CREATININE 0.8 11/06/2014 1134   CREATININE 0.81 06/13/2014 0819   CREATININE 0.9 06/01/2012 1008   CALCIUM 10.4 11/06/2014 1134   CALCIUM 10.8* 06/13/2014 0819   CALCIUM 9.8 06/01/2012 1008   GFRNONAA 87* 07/06/2012 1122   GFRAA >90 07/06/2012 1122     Lab Results  Component Value Date   INR 0.97 03/11/2011   INR 1.1 08/29/2008   No results found for: PTT  SOCIAL HISTORY: History   Social History  . Marital Status: Widowed    Spouse Name: N/A    Number of Children: 3  . Years of Education: N/A   Occupational History  . Not on file.   Social History Main Topics  . Smoking status: Never Smoker   . Smokeless tobacco: Never Used  . Alcohol Use: No  . Drug Use: No  . Sexual Activity: Not Currently   Other Topics Concern  . Not on file   Social History Narrative   The patient is widowed.   Patient had 2 sons 1 daughter. The sons are in [place] and [place], [place].    The daughter lives in [place].   The patient is a nonsmoker, nondrinker, has never used smokeless tobacco products.    FAMILY HISTORY: Family History  Problem Relation Age of Onset  . Alcohol abuse Other   . Drug abuse Other   . Breast cancer Mother   . Prostate cancer Father   . Cancer Father     colon  . Colon  cancer Brother   . Esophageal cancer Neg Hx   . Rectal cancer Neg Hx   . Stomach cancer Neg Hx     DENTAL HISTORY: CHIEF COMPLAINT: The patient was referred by Dr. Alvy Bimler for a Pre-Xgeva dental examination.   HPI: Helen [name] is a 80 year old female with history of metastatic breast cancer to bone with multiple previous doses of Xgeva from 08/20/2012 through 03/18/2013. Patient was previously evaluated on 06/13/2013 and was referred to an oral surgeon, Dr. Tamela Oddi, for extraction of indicated teeth. The patient had teeth extracted during multiple appointments.  Tooth numbers 1, 3, and 30 were removed on 08/17/2013. Tooth numbers  14, 15, and 16 were extracted on 11/28/2013. Tooth numbers 17 and 18 were then removed on 02/01/2014. There were no apparent complications or problems with osteonecrosis of the jaws related to previous Xgeva therapy. The patient was then lost to follow-up and is now being referred by Dr. Alvy Bimler for a dental examination and treatment prior to restarting the [facility] therapy.  The patient currently denies acute toothaches, swellings, or abscesses. The patient last had dental treatment by Dr. Tamela Oddi for postoperative evaluation. Patient has not been seen by a regular dentist for an exam and cleaning in the interim. Prior to my consultation in August 2014, the patient had not been seen since the 1980s. The patient currently does not seek regular dental care nor does she have a primary dentist.   DENTAL EXAMINATION: GENERAL: The patient is a well-developed, well-nourished female in no acute distress.  HEAD AND NECK: There is no palpable submandibular lymphadenopathy. The patient denies acute TMJ symptoms. INTRAORAL EXAM: The patient has normal saliva. I do not see any evidence of oral abscess formation. The patient has a deep palatal vault. There is no evidence of delayed healing from previous extractions. There is no evidence of osteonecrosis of the jaw  related to previous extractions.  DENTITION: The patient is missing tooth numbers 1, 3, 14, 15, 16, 17, 18, 19, 30, and 32. There is a supernumerary premolar in the area of 20/21. This is displaced towards the lingual. Tooth numbers 20, 21, and supernumerary tooth in that area are all crowded and rotated.  PERIODONTAL: Patient has chronic periodontal disease with significant plaque and calculus accumulations, gingival recession, and tooth mobility. There is moderate bone loss. DENTAL CARIES/SUBOPTIMAL RESTORATIONS: Dental caries are noted on the lingual of #7 and the mesial of #20. ENDODONTIC: The patient currently denies acute pulpitis symptoms. I do not see any evidence of periapical pathology at this time. CROWN AND BRIDGE: There are no crown or bridge restorations. PROSTHODONTIC: Patient does not have partial dentures. OCCLUSION: The patient has a malocclusion with anterior open bite and minimal posterior tooth contacts.  RADIOGRAPHIC INTERPRETATION: An orthopantogram was taken and supplemented with upper and lower periapical radiographs. There are multiple missing teeth. There is a supernumerary tooth in the area of tooth numbers 20/21. There is moderate bone loss noted. There is radiographic calculus noted. There is supra-eruption and drifting of the unopposed teeth into the edentulous areas. Multiple rotated teeth are noted. The bone is less dense in the area of extraction sites numbers 17 and 18.  ASSESSMENTS: 1. History of left breast cancer with metastasis to bone 2. History of previous Xgeva therapy from October 2013 through May 2014. 3. Pre-Xgeva dental evaluation today-prior to start of Xgeva again 4. Chronic periodontitis with bone loss 5. Gingival recession 6. Significant plaque and calculus accumulations 7. Tooth mobility 8. Multiple missing teeth 9. Multiple malpositioned and rotated teeth 10. Supernumerary tooth in the area of tooth numbers 20/21 11. Anterior open bite 12.  Malocclusion 13. History of oral neglect 14. Risk for osteonecrosis of the jaw related to previous Xgeva therapy, although previous extractions healed without complications.  PLAN/RECOMMENDATIONS: 1. I discussed the risks, benefits, and complications of various treatment options with the patient in relationship to her medical and dental conditions, previous Xgeva therapy, and anticipated restarting of the Xgeva therapy and the potential risk for osteonecrosis of the jaw.  We discussed various treatment options to include no treatment, extraction of tooth numbers 20, 21, and supernumerary tooth in the area  of 20/21, periodontal therapy, dental restorations, root canal therapy, crown and bridge therapy, implant therapy, and replacement of missing teeth as indicated. The patient currently wishes to be  referred to Dr. Tamela Oddi, oral surgeon, for evaluation for the removal of tooth numbers 20, 21, and supernumerary tooth between tooth numbers 20/21, and to be referred to [facility] for periodontal therapy and dental restorations as indicated along with maintenance periodontal care. After the periodontal, restorative, and surgical procedures have been completed, the patient will be cleared for the start of Xgeva therapy. In the interim, Xgeva therapy should be withheld if at all possible.   2. Discussion of findings with medical team and coordination of future medical and dental care as needed.  I spent in excess of  100 minutes during the conduct of this consultation and >50% of this time involved direct face-to-face encounter for counseling and/or coordination of the patient's care.    Lenn Cal, DDS

## 2014-11-08 NOTE — Progress Notes (Signed)
Histology and Location of Primary Cancer: ,Breast recurrence with with Bone metastases  Sites of Visceral and Bony Metastatic Disease: T11,T12, mild L3 mets with early epidural extension on the left  Location(s) of Symptomatic Metastases: chronic back pain   Past/Anticipated chemotherapy by medical oncology, if any: 11/08/14 Dr. Alvy Bimler seen, had re[eat CT 11/06/14=progression in size liver metastases and pulmonary metastases with mild progression of lymphadenopathy, referral for dental evalulation  Prior to starting XGeva, referral to Dr.Moody for any additional  palliative radiation therapyif it would be beneficial to reduce risk of fracture  Arimidex Stopped, not working, , IT consultant to start? Dental reval , Dr. Alvy Bimler last note 08/15/14 , Hx CLL/SLL= 6 cycles of CVP and Rituxan 2012, followed by maintenance Rituxan, Later recurrence disease,had 4 cycles of R-Bendamustine., proceeded maintenance Rituxan., April 2015=new lymphadenopathy, On 04/03/14, axillary lymph node dissection =recurrence of CLL/SLL, 04/08/14 started on Obinutuzumab, 08/04/14 repeat Ct showed improvement of disease of right axilla but progression of liver and lung metastases 08/11/14 MR spine mets=T11,T12 metastases Had Fulvestrant 500 mg injection \ Faslodex injection monthly   Pain on a scale of 0-10 is: right shoulder aching since today, also mild pain in right breast, no nausea, no pain in abdomen  If Spine Met(s), symptoms, if any, include  Bowel/Bladder retention or incontinence :regular bowel movements,   Numbness or weakness in extremities:numbnes fingers, hands, knees and feet     Current Decadron regimen, if applicable: No  Ambulatory status? Walker? Wheelchair?:has walker at home  SAFETY ISSUES:  Prior radiation? Yes, 07/19/12-07/30/12 L spine and right shoulder Dr.Moody  Pacemaker/ICD? No  Possible current pregnancy? No  Is the patient on methotrexate? No  Current Complaints / other details: Hx  non hodgkins lymphoma,stage IV, Left breast Cancer dx 2009, patient stated "she hoped no more radiation"  Went to see Dr. Enrique Sack today, referaal to DR,. [place] teeth needs taken out(3)   Allergies: codeine,pcns,sulfa derivatives,tape,tramadol,

## 2014-11-09 ENCOUNTER — Ambulatory Visit: Payer: Medicare Other | Admitting: Radiation Oncology

## 2014-11-22 ENCOUNTER — Telehealth: Payer: Self-pay | Admitting: *Deleted

## 2014-11-22 NOTE — Telephone Encounter (Signed)
Dental office of [facility] called to [facility] stating pt was seen yesterday per referral from Dr Enrique Sack.  Per referral Dr Enrique Sack requested call to this office to "coordinate schedule with extractions "  Pt is schedule on 11/29/2014 at 9am for 3 dental extractions.  Request is to return call to Catherine at [phone]  THIS NOTE WILL BE FORWARDED TO MD AND RN AT [facility]

## 2014-11-22 NOTE — Telephone Encounter (Signed)
Called Dr. Janeann Forehand office back and informed that it is ok from our standpoint for pt to have her extractions on 1/27 as scheduled.  It does not interfere w/ pt's schedule at our clinic.

## 2014-12-05 ENCOUNTER — Ambulatory Visit (HOSPITAL_BASED_OUTPATIENT_CLINIC_OR_DEPARTMENT_OTHER): Payer: Medicare Other

## 2014-12-05 ENCOUNTER — Ambulatory Visit: Payer: Self-pay

## 2014-12-05 ENCOUNTER — Ambulatory Visit (HOSPITAL_BASED_OUTPATIENT_CLINIC_OR_DEPARTMENT_OTHER): Payer: Medicare Other | Admitting: Hematology and Oncology

## 2014-12-05 ENCOUNTER — Telehealth: Payer: Self-pay | Admitting: Hematology and Oncology

## 2014-12-05 ENCOUNTER — Other Ambulatory Visit (HOSPITAL_BASED_OUTPATIENT_CLINIC_OR_DEPARTMENT_OTHER): Payer: Medicare Other

## 2014-12-05 ENCOUNTER — Other Ambulatory Visit: Payer: Self-pay

## 2014-12-05 ENCOUNTER — Encounter: Payer: Self-pay | Admitting: Hematology and Oncology

## 2014-12-05 VITALS — BP 152/64 | HR 67 | Temp 97.8°F | Resp 18 | Ht 62.0 in | Wt 136.8 lb

## 2014-12-05 DIAGNOSIS — C7951 Secondary malignant neoplasm of bone: Secondary | ICD-10-CM

## 2014-12-05 DIAGNOSIS — C8590 Non-Hodgkin lymphoma, unspecified, unspecified site: Secondary | ICD-10-CM

## 2014-12-05 DIAGNOSIS — C50912 Malignant neoplasm of unspecified site of left female breast: Secondary | ICD-10-CM

## 2014-12-05 DIAGNOSIS — C7800 Secondary malignant neoplasm of unspecified lung: Secondary | ICD-10-CM

## 2014-12-05 DIAGNOSIS — Z5111 Encounter for antineoplastic chemotherapy: Secondary | ICD-10-CM

## 2014-12-05 DIAGNOSIS — C8300 Small cell B-cell lymphoma, unspecified site: Secondary | ICD-10-CM

## 2014-12-05 LAB — CBC WITH DIFFERENTIAL/PLATELET
BASO%: 0.9 % (ref 0.0–2.0)
BASOS ABS: 0 10*3/uL (ref 0.0–0.1)
EOS ABS: 0.3 10*3/uL (ref 0.0–0.5)
EOS%: 6.6 % (ref 0.0–7.0)
HEMATOCRIT: 38.1 % (ref 34.8–46.6)
HGB: 12 g/dL (ref 11.6–15.9)
LYMPH%: 9 % — ABNORMAL LOW (ref 14.0–49.7)
MCH: 27.3 pg (ref 25.1–34.0)
MCHC: 31.5 g/dL (ref 31.5–36.0)
MCV: 86.9 fL (ref 79.5–101.0)
MONO#: 0.5 10*3/uL (ref 0.1–0.9)
MONO%: 12.2 % (ref 0.0–14.0)
NEUT%: 71.3 % (ref 38.4–76.8)
NEUTROS ABS: 3.1 10*3/uL (ref 1.5–6.5)
PLATELETS: 230 10*3/uL (ref 145–400)
RBC: 4.39 10*6/uL (ref 3.70–5.45)
RDW: 14.7 % — ABNORMAL HIGH (ref 11.2–14.5)
WBC: 4.4 10*3/uL (ref 3.9–10.3)
lymph#: 0.4 10*3/uL — ABNORMAL LOW (ref 0.9–3.3)

## 2014-12-05 LAB — COMPREHENSIVE METABOLIC PANEL (CC13)
ALBUMIN: 4.2 g/dL (ref 3.5–5.0)
ALT: 14 U/L (ref 0–55)
ANION GAP: 12 meq/L — AB (ref 3–11)
AST: 21 U/L (ref 5–34)
Alkaline Phosphatase: 94 U/L (ref 40–150)
BILIRUBIN TOTAL: 0.34 mg/dL (ref 0.20–1.20)
BUN: 10.5 mg/dL (ref 7.0–26.0)
CO2: 25 mEq/L (ref 22–29)
Calcium: 10.2 mg/dL (ref 8.4–10.4)
Chloride: 107 mEq/L (ref 98–109)
Creatinine: 0.8 mg/dL (ref 0.6–1.1)
EGFR: 80 mL/min/{1.73_m2} — ABNORMAL LOW (ref >90)
Glucose: 95 mg/dl (ref 70–140)
POTASSIUM: 3.9 meq/L (ref 3.5–5.1)
SODIUM: 144 meq/L (ref 136–145)
Total Protein: 7.3 g/dL (ref 6.4–8.3)

## 2014-12-05 MED ORDER — FULVESTRANT 250 MG/5ML IM SOLN
500.0000 mg | INTRAMUSCULAR | Status: DC
  Administered 2014-12-05: 500 mg via INTRAMUSCULAR
  Filled 2014-12-05: qty 10

## 2014-12-05 NOTE — Telephone Encounter (Signed)
gv and printed appt sched anda vs for pt for March.... °

## 2014-12-05 NOTE — Assessment & Plan Note (Signed)
Her MRI appears somewhat stable and she has not much symptoms apart from chronic arthritis. I recommend review with radiation oncologist to see if any additional palliative radiation therapy would be beneficial to reduce risk of fracture. He recommended observation She need to be started on XGeva the but had problems with dental issues in the past.   she had dental extraction on 11/29/2014. I recommend waiting a few more weeks with plan to start Xgeva in March.

## 2014-12-05 NOTE — Assessment & Plan Note (Signed)
She is not symptomatic. She will continue her Faslodex injection monthly.  The imaging study is hard and challenging to interpret. The pulmonary metastasis and liver metastasis are likely related to breast cancer and they are getting better. For that I recommend we continue the same treatment. Even though her tumor markers are mildly elevated, overall I felt that her disease is stable.  I will continue treatment for a few moments with plan to repeat imaging study in April.

## 2014-12-05 NOTE — Assessment & Plan Note (Signed)
Her scans show she had mild progressive lymphadenopathy. I suspect this is due to her CLL. Overall, I recommend observation for this.

## 2014-12-05 NOTE — Progress Notes (Signed)
[facility] OFFICE PROGRESS NOTE  Patient Care Team: No Pcp Per Patient as PCP - General (General Practice) [name], MD as Consulting Physician (Hematology and Oncology)  SUMMARY OF ONCOLOGIC HISTORY:   Cancer of left breast   08/03/2008 Initial Diagnosis Breast CA   11/06/2014 Imaging Repeat CT scan of the chest, abdomen and pelvis show regression in the size of liver metastasis and pulmonary metastasis with mild progression of lymphadenopathy   11/06/2014 Tumor Marker [id number] is elevated at 275    INTERVAL HISTORY: Please see below for problem oriented charting.  she feels fine apart from mild muscle skeletal pain. She rarely uses her pain medicine. She had recent dental extraction. She denies any recent abnormal breast examination, palpable mass, abnormal breast appearance or nipple changes  Denies new lymphadenopathy.  REVIEW OF SYSTEMS:   Constitutional: Denies fevers, chills or abnormal weight loss Eyes: Denies blurriness of vision Ears, nose, mouth, throat, and face: Denies mucositis or sore throat Respiratory: Denies cough, dyspnea or wheezes Cardiovascular: Denies palpitation, chest discomfort or lower extremity swelling Gastrointestinal:  Denies nausea, heartburn or change in bowel habits Skin: Denies abnormal skin rashes Lymphatics: Denies new lymphadenopathy or easy bruising Neurological:Denies numbness, tingling or new weaknesses Behavioral/Psych: Mood is stable, no new changes  All other systems were reviewed with the patient and are negative.  I have reviewed the past medical history, past surgical history, social history and family history with the patient and they are unchanged from previous note.  ALLERGIES:  is allergic to codeine; penicillins; sulfonamide derivatives; tape; and tramadol hcl.  MEDICATIONS:  Current Outpatient Prescriptions  Medication Sig Dispense Refill  . fulvestrant (FASLODEX) 250 MG/5ML injection Inject 500 mg into the  muscle every 14 (fourteen) days. One injection each buttock over 1-2 minutes. Warm prior to use.    [name] guaiFENesin-dextromethorphan (ROBITUSSIN DM) 100-10 MG/5ML syrup Take 5 mLs by mouth every 4 (four) hours as needed for cough.    [name] HYDROcodone-acetaminophen (NORCO) 5-325 MG per tablet Take 1 tablet by mouth every 6 (six) hours as needed for moderate pain. 60 tablet 0   No current facility-administered medications for this visit.   Facility-Administered Medications Ordered in Other Visits  Medication Dose Route Frequency Provider Last Rate Last Dose  . fulvestrant (FASLODEX) injection 500 mg  500 mg Intramuscular Q30 days [name], MD   500 mg at 12/05/14 0902    PHYSICAL EXAMINATION: ECOG PERFORMANCE STATUS: 1 - Symptomatic but completely ambulatory  Filed Vitals:   12/05/14 0859  BP: 152/64  Pulse: 67  Temp: 97.8 F (36.6 C)  Resp: 18   Filed Weights   12/05/14 0859  Weight: 136 lb 12.8 oz (62.052 kg)    GENERAL:alert, no distress and comfortable SKIN: skin color, texture, turgor are normal, no rashes or significant lesions EYES: normal, Conjunctiva are pink and non-injected, sclera clear OROPHARYNX:no exudate, no erythema and lips, buccal mucosa, and tongue normal  NECK: supple, thyroid normal size, non-tender, without nodularity LYMPH:  no palpable lymphadenopathy in the cervical, axillary or inguinal LUNGS: clear to auscultation and percussion with normal breathing effort HEART: regular rate & rhythm and no murmurs and no lower extremity edema ABDOMEN:abdomen soft, non-tender and normal bowel sounds Musculoskeletal:no cyanosis of digits and no clubbing  NEURO: alert & oriented x 3 with fluent speech, no focal motor/sensory deficits  LABORATORY DATA:  I have reviewed the data as listed    Component Value Date/Time   NA 144 12/05/2014 0818   NA  140 06/13/2014 0819   NA 142 06/01/2012 1008   K 3.9 12/05/2014 0818   K 4.0 06/13/2014 0819   K 3.8 06/01/2012 1008    CL 101 06/13/2014 0819   CL 107 04/22/2013 0922   CL 103 06/01/2012 1008   CO2 25 12/05/2014 0818   CO2 25 06/13/2014 0819   CO2 25 06/01/2012 1008   GLUCOSE 95 12/05/2014 0818   GLUCOSE 98 06/13/2014 0819   GLUCOSE 89 04/22/2013 0922   GLUCOSE 108 06/01/2012 1008   BUN 10.5 12/05/2014 0818   BUN 13 06/13/2014 0819   BUN 9 06/01/2012 1008   CREATININE 0.8 12/05/2014 0818   CREATININE 0.81 06/13/2014 0819   CREATININE 0.9 06/01/2012 1008   CALCIUM 10.2 12/05/2014 0818   CALCIUM 10.8* 06/13/2014 0819   CALCIUM 9.8 06/01/2012 1008   PROT 7.3 12/05/2014 0818   PROT 7.5 06/13/2014 0819   PROT 8.1 08/14/2010 1251   ALBUMIN 4.2 12/05/2014 0818   ALBUMIN 3.9 06/13/2014 0819   AST 21 12/05/2014 0818   AST 23 06/13/2014 0819   AST 19 08/14/2010 1251   ALT 14 12/05/2014 0818   ALT 13 06/13/2014 0819   ALT 9* 08/14/2010 1251   ALKPHOS 94 12/05/2014 0818   ALKPHOS 89 06/13/2014 0819   ALKPHOS 78 08/14/2010 1251   BILITOT 0.34 12/05/2014 0818   BILITOT 0.2* 06/13/2014 0819   BILITOT 0.40 08/14/2010 1251   GFRNONAA 87* 07/06/2012 1122   GFRAA >90 07/06/2012 1122    No results found for: SPEP, UPEP  Lab Results  Component Value Date   WBC 4.4 12/05/2014   NEUTROABS 3.1 12/05/2014   HGB 12.0 12/05/2014   HCT 38.1 12/05/2014   MCV 86.9 12/05/2014   PLT 230 12/05/2014      Chemistry      Component Value Date/Time   NA 144 12/05/2014 0818   NA 140 06/13/2014 0819   NA 142 06/01/2012 1008   K 3.9 12/05/2014 0818   K 4.0 06/13/2014 0819   K 3.8 06/01/2012 1008   CL 101 06/13/2014 0819   CL 107 04/22/2013 0922   CL 103 06/01/2012 1008   CO2 25 12/05/2014 0818   CO2 25 06/13/2014 0819   CO2 25 06/01/2012 1008   BUN 10.5 12/05/2014 0818   BUN 13 06/13/2014 0819   BUN 9 06/01/2012 1008   CREATININE 0.8 12/05/2014 0818   CREATININE 0.81 06/13/2014 0819   CREATININE 0.9 06/01/2012 1008      Component Value Date/Time   CALCIUM 10.2 12/05/2014 0818   CALCIUM 10.8*  06/13/2014 0819   CALCIUM 9.8 06/01/2012 1008   ALKPHOS 94 12/05/2014 0818   ALKPHOS 89 06/13/2014 0819   ALKPHOS 78 08/14/2010 1251   AST 21 12/05/2014 0818   AST 23 06/13/2014 0819   AST 19 08/14/2010 1251   ALT 14 12/05/2014 0818   ALT 13 06/13/2014 0819   ALT 9* 08/14/2010 1251   BILITOT 0.34 12/05/2014 0818   BILITOT 0.2* 06/13/2014 0819   BILITOT 0.40 08/14/2010 1251      ASSESSMENT & PLAN:  Cancer of left breast She is not symptomatic. She will continue her Faslodex injection monthly.  The imaging study is hard and challenging to interpret. The pulmonary metastasis and liver metastasis are likely related to breast cancer and they are getting better. For that I recommend we continue the same treatment. Even though her tumor markers are mildly elevated, overall I felt that her disease is stable.  I will continue treatment for a few moments with plan to repeat imaging study in April.     Lymphoma, small lymphocytic Her scans show she had mild progressive lymphadenopathy. I suspect this is due to her CLL. Overall, I recommend observation for this.   Metastasis to bone Her MRI appears somewhat stable and she has not much symptoms apart from chronic arthritis. I recommend review with radiation oncologist to see if any additional palliative radiation therapy would be beneficial to reduce risk of fracture. He recommended observation She need to be started on XGeva the but had problems with dental issues in the past.   she had dental extraction on 11/29/2014. I recommend waiting a few more weeks with plan to start Xgeva in March.    No orders of the defined types were placed in this encounter.   All questions were answered. The patient knows to call the clinic with any problems, questions or concerns. No barriers to learning was detected. I spent 25 minutes counseling the patient face to face. The total time spent in the appointment was 30 minutes and more than 50% was on  counseling and review of test results     [facility], Maliik Karner, MD 12/05/2014 10:13 AM

## 2015-01-02 ENCOUNTER — Encounter: Payer: Self-pay | Admitting: Hematology and Oncology

## 2015-01-02 ENCOUNTER — Telehealth: Payer: Self-pay | Admitting: Hematology and Oncology

## 2015-01-02 ENCOUNTER — Other Ambulatory Visit (HOSPITAL_BASED_OUTPATIENT_CLINIC_OR_DEPARTMENT_OTHER): Payer: Medicare Other

## 2015-01-02 ENCOUNTER — Ambulatory Visit (HOSPITAL_BASED_OUTPATIENT_CLINIC_OR_DEPARTMENT_OTHER): Payer: Medicare Other

## 2015-01-02 ENCOUNTER — Ambulatory Visit (HOSPITAL_BASED_OUTPATIENT_CLINIC_OR_DEPARTMENT_OTHER): Payer: Medicare Other | Admitting: Hematology and Oncology

## 2015-01-02 VITALS — BP 138/89 | HR 72 | Temp 98.0°F | Resp 18 | Ht 62.0 in | Wt 138.6 lb

## 2015-01-02 DIAGNOSIS — C7951 Secondary malignant neoplasm of bone: Secondary | ICD-10-CM

## 2015-01-02 DIAGNOSIS — C50912 Malignant neoplasm of unspecified site of left female breast: Secondary | ICD-10-CM

## 2015-01-02 DIAGNOSIS — H9312 Tinnitus, left ear: Secondary | ICD-10-CM | POA: Diagnosis present

## 2015-01-02 DIAGNOSIS — C8300 Small cell B-cell lymphoma, unspecified site: Secondary | ICD-10-CM

## 2015-01-02 DIAGNOSIS — C8590 Non-Hodgkin lymphoma, unspecified, unspecified site: Secondary | ICD-10-CM

## 2015-01-02 DIAGNOSIS — C7802 Secondary malignant neoplasm of left lung: Secondary | ICD-10-CM

## 2015-01-02 DIAGNOSIS — R21 Rash and other nonspecific skin eruption: Secondary | ICD-10-CM

## 2015-01-02 DIAGNOSIS — C7801 Secondary malignant neoplasm of right lung: Secondary | ICD-10-CM | POA: Diagnosis not present

## 2015-01-02 DIAGNOSIS — Z5111 Encounter for antineoplastic chemotherapy: Secondary | ICD-10-CM

## 2015-01-02 DIAGNOSIS — D630 Anemia in neoplastic disease: Secondary | ICD-10-CM

## 2015-01-02 HISTORY — DX: Tinnitus, left ear: H93.12

## 2015-01-02 LAB — CANCER ANTIGEN 15-3: CANCER ANTIGEN-BREAST 15-3: 237 U/mL — AB (ref <32)

## 2015-01-02 LAB — CBC WITH DIFFERENTIAL/PLATELET
BASO%: 1.2 % (ref 0.0–2.0)
BASOS ABS: 0 10*3/uL (ref 0.0–0.1)
EOS ABS: 0.1 10*3/uL (ref 0.0–0.5)
EOS%: 4.4 % (ref 0.0–7.0)
HCT: 33.4 % — ABNORMAL LOW (ref 34.8–46.6)
HEMOGLOBIN: 10.5 g/dL — AB (ref 11.6–15.9)
LYMPH%: 7.9 % — AB (ref 14.0–49.7)
MCH: 27.1 pg (ref 25.1–34.0)
MCHC: 31.6 g/dL (ref 31.5–36.0)
MCV: 85.7 fL (ref 79.5–101.0)
MONO#: 0.6 10*3/uL (ref 0.1–0.9)
MONO%: 18.2 % — AB (ref 0.0–14.0)
NEUT%: 68.3 % (ref 38.4–76.8)
NEUTROS ABS: 2.3 10*3/uL (ref 1.5–6.5)
PLATELETS: 265 10*3/uL (ref 145–400)
RBC: 3.89 10*6/uL (ref 3.70–5.45)
RDW: 14.3 % (ref 11.2–14.5)
WBC: 3.4 10*3/uL — ABNORMAL LOW (ref 3.9–10.3)
lymph#: 0.3 10*3/uL — ABNORMAL LOW (ref 0.9–3.3)

## 2015-01-02 LAB — COMPREHENSIVE METABOLIC PANEL (CC13)
ALBUMIN: 3.6 g/dL (ref 3.5–5.0)
ALT: 17 U/L (ref 0–55)
ANION GAP: 12 meq/L — AB (ref 3–11)
AST: 21 U/L (ref 5–34)
Alkaline Phosphatase: 96 U/L (ref 40–150)
BUN: 10.2 mg/dL (ref 7.0–26.0)
CO2: 25 meq/L (ref 22–29)
Calcium: 10.1 mg/dL (ref 8.4–10.4)
Chloride: 105 mEq/L (ref 98–109)
Creatinine: 0.7 mg/dL (ref 0.6–1.1)
GLUCOSE: 98 mg/dL (ref 70–140)
POTASSIUM: 3.7 meq/L (ref 3.5–5.1)
SODIUM: 142 meq/L (ref 136–145)
Total Bilirubin: 0.27 mg/dL (ref 0.20–1.20)
Total Protein: 6.7 g/dL (ref 6.4–8.3)

## 2015-01-02 LAB — TECHNOLOGIST REVIEW

## 2015-01-02 MED ORDER — HYDROCODONE-ACETAMINOPHEN 5-325 MG PO TABS: 1.0000 | ORAL_TABLET | Freq: Four times a day (QID) | ORAL | Status: DC | PRN

## 2015-01-02 MED ORDER — FULVESTRANT 250 MG/5ML IM SOLN
500.0000 mg | INTRAMUSCULAR | Status: DC
  Administered 2015-01-02: 500 mg via INTRAMUSCULAR
  Filled 2015-01-02: qty 10

## 2015-01-02 NOTE — Assessment & Plan Note (Signed)
She is not symptomatic. She will continue her Faslodex injection monthly.  The recent imaging study is hard and challenging to interpret. The pulmonary metastasis and liver metastasis are likely related to breast cancer and they are getting better. For that I recommend we continue the same treatment. Even though her tumor markers are mildly elevated, overall I felt that her disease is stable.  I will continue treatment for now with plan to repeat imaging study at the end of the month.

## 2015-01-02 NOTE — Assessment & Plan Note (Signed)
She had what sounds second tinnitis on the left ear. Examination was normal. She is concerned about brain metastasis. I will refer her to ENT and order MRI to exclude intracranial metastasis

## 2015-01-02 NOTE — Assessment & Plan Note (Signed)
This is likely anemia of chronic disease. The patient denies recent history of bleeding such as epistaxis, hematuria or hematochezia. She is asymptomatic from the anemia. We will observe for now.  

## 2015-01-02 NOTE — Telephone Encounter (Signed)
gv and printed appt sched and avs for pt for March...Marland KitchenS.w [name] [place] pt sched for 3.7 @ 8:40am with Dr. Sallee Provencal

## 2015-01-02 NOTE — Assessment & Plan Note (Signed)
Her scans show she had mild progressive lymphadenopathy. I suspect this is due to her CLL. Overall, I recommend observation for this.

## 2015-01-02 NOTE — Assessment & Plan Note (Signed)
She is concerned about skin changes related to injection. On examination, apart from mild skin discoloration, there were no signs of cellulitis. I reassured the patient.

## 2015-01-02 NOTE — Progress Notes (Signed)
[name] OFFICE PROGRESS NOTE  Patient Care Team: No Pcp Per Patient as PCP - General (General Practice) [name], MD as Consulting Physician (Hematology and Oncology)  SUMMARY OF ONCOLOGIC HISTORY:   Cancer of left breast   08/03/2008 Initial Diagnosis Breast CA   11/06/2014 Imaging Repeat CT scan of the chest, abdomen and pelvis show regression in the size of liver metastasis and pulmonary metastasis with mild progression of lymphadenopathy   11/06/2014 Tumor Marker [id number] is elevated at 275    INTERVAL HISTORY: Please see below for problem oriented charting. She is seen today before treatment. She is concerned about mild skin discoloration on the buttocks from prior injections. She still not cleared to proceed with Xgeva by her dentist. She complained of tinnitus REVIEW OF SYSTEMS:   Constitutional: Denies fevers, chills or abnormal weight loss Eyes: Denies blurriness of vision Ears, nose, mouth, throat, and face: Denies mucositis or sore throat Respiratory: Denies cough, dyspnea or wheezes Cardiovascular: Denies palpitation, chest discomfort or lower extremity swelling Gastrointestinal:  Denies nausea, heartburn or change in bowel habits Lymphatics: Denies new lymphadenopathy or easy bruising Neurological:Denies numbness, tingling or new weaknesses Behavioral/Psych: Mood is stable, no new changes  All other systems were reviewed with the patient and are negative.  I have reviewed the past medical history, past surgical history, social history and family history with the patient and they are unchanged from previous note.  ALLERGIES:  is allergic to codeine; penicillins; sulfonamide derivatives; tape; and tramadol hcl.  MEDICATIONS:  Current Outpatient Prescriptions  Medication Sig Dispense Refill  . fulvestrant (FASLODEX) 250 MG/5ML injection Inject 500 mg into the muscle every 14 (fourteen) days. One injection each buttock over 1-2 minutes. Warm prior to  use.    [name] guaiFENesin-dextromethorphan (ROBITUSSIN DM) 100-10 MG/5ML syrup Take 5 mLs by mouth every 4 (four) hours as needed for cough.    [name] HYDROcodone-acetaminophen (NORCO) 5-325 MG per tablet Take 1 tablet by mouth every 6 (six) hours as needed for moderate pain. 60 tablet 0   No current facility-administered medications for this visit.    PHYSICAL EXAMINATION: ECOG PERFORMANCE STATUS: 0 - Asymptomatic  Filed Vitals:   01/02/15 0941  BP: 138/89  Pulse: 72  Temp: 98 F (36.7 C)  Resp: 18   Filed Weights   01/02/15 0941  Weight: 138 lb 9.6 oz (62.869 kg)    GENERAL:alert, no distress and comfortable SKIN: skin color, texture, turgor are normal, no rashes or significant lesions. Noted small skin discoloration, no cellulitis EYES: normal, Conjunctiva are pink and non-injected, sclera clear OROPHARYNX:no exudate, no erythema and lips, buccal mucosa, and tongue normal  NECK: supple, thyroid normal size, non-tender, without nodularity LYMPH:  no palpable lymphadenopathy in the cervical, axillary or inguinal LUNGS: clear to auscultation and percussion with normal breathing effort HEART: regular rate & rhythm and no murmurs and no lower extremity edema ABDOMEN:abdomen soft, non-tender and normal bowel sounds Musculoskeletal:no cyanosis of digits and no clubbing  NEURO: alert & oriented x 3 with fluent speech, no focal motor/sensory deficits  LABORATORY DATA:  I have reviewed the data as listed    Component Value Date/Time   NA 144 12/05/2014 0818   NA 140 06/13/2014 0819   NA 142 06/01/2012 1008   K 3.9 12/05/2014 0818   K 4.0 06/13/2014 0819   K 3.8 06/01/2012 1008   CL 101 06/13/2014 0819   CL 107 04/22/2013 0922   CL 103 06/01/2012 1008   CO2 25 12/05/2014  0818   CO2 25 06/13/2014 0819   CO2 25 06/01/2012 1008   GLUCOSE 95 12/05/2014 0818   GLUCOSE 98 06/13/2014 0819   GLUCOSE 89 04/22/2013 0922   GLUCOSE 108 06/01/2012 1008   BUN 10.5 12/05/2014 0818   BUN 13  06/13/2014 0819   BUN 9 06/01/2012 1008   CREATININE 0.8 12/05/2014 0818   CREATININE 0.81 06/13/2014 0819   CREATININE 0.9 06/01/2012 1008   CALCIUM 10.2 12/05/2014 0818   CALCIUM 10.8* 06/13/2014 0819   CALCIUM 9.8 06/01/2012 1008   PROT 7.3 12/05/2014 0818   PROT 7.5 06/13/2014 0819   PROT 8.1 08/14/2010 1251   ALBUMIN 4.2 12/05/2014 0818   ALBUMIN 3.9 06/13/2014 0819   AST 21 12/05/2014 0818   AST 23 06/13/2014 0819   AST 19 08/14/2010 1251   ALT 14 12/05/2014 0818   ALT 13 06/13/2014 0819   ALT 9* 08/14/2010 1251   ALKPHOS 94 12/05/2014 0818   ALKPHOS 89 06/13/2014 0819   ALKPHOS 78 08/14/2010 1251   BILITOT 0.34 12/05/2014 0818   BILITOT 0.2* 06/13/2014 0819   BILITOT 0.40 08/14/2010 1251   GFRNONAA 87* 07/06/2012 1122   GFRAA >90 07/06/2012 1122    No results found for: SPEP, UPEP  Lab Results  Component Value Date   WBC 3.4* 01/02/2015   NEUTROABS 2.3 01/02/2015   HGB 10.5* 01/02/2015   HCT 33.4* 01/02/2015   MCV 85.7 01/02/2015   PLT 265 01/02/2015      Chemistry      Component Value Date/Time   NA 144 12/05/2014 0818   NA 140 06/13/2014 0819   NA 142 06/01/2012 1008   K 3.9 12/05/2014 0818   K 4.0 06/13/2014 0819   K 3.8 06/01/2012 1008   CL 101 06/13/2014 0819   CL 107 04/22/2013 0922   CL 103 06/01/2012 1008   CO2 25 12/05/2014 0818   CO2 25 06/13/2014 0819   CO2 25 06/01/2012 1008   BUN 10.5 12/05/2014 0818   BUN 13 06/13/2014 0819   BUN 9 06/01/2012 1008   CREATININE 0.8 12/05/2014 0818   CREATININE 0.81 06/13/2014 0819   CREATININE 0.9 06/01/2012 1008      Component Value Date/Time   CALCIUM 10.2 12/05/2014 0818   CALCIUM 10.8* 06/13/2014 0819   CALCIUM 9.8 06/01/2012 1008   ALKPHOS 94 12/05/2014 0818   ALKPHOS 89 06/13/2014 0819   ALKPHOS 78 08/14/2010 1251   AST 21 12/05/2014 0818   AST 23 06/13/2014 0819   AST 19 08/14/2010 1251   ALT 14 12/05/2014 0818   ALT 13 06/13/2014 0819   ALT 9* 08/14/2010 1251   BILITOT 0.34  12/05/2014 0818   BILITOT 0.2* 06/13/2014 0819   BILITOT 0.40 08/14/2010 1251      ASSESSMENT & PLAN:    Orders Placed This Encounter  Procedures  . CT Chest W Contrast    Standing Status: Future     Number of Occurrences:      Standing Expiration Date: 03/03/2016    Order Specific Question:  Reason for Exam (SYMPTOM  OR DIAGNOSIS REQUIRED)    Answer:  metastatic breast ca and CLL, assess response to Rx    Order Specific Question:  Preferred imaging location?    Answer:  [facility]  . CT Abdomen Pelvis W Contrast    Standing Status: Future     Number of Occurrences:      Standing Expiration Date: 04/03/2016    Order Specific Question:  Reason for Exam (SYMPTOM  OR DIAGNOSIS REQUIRED)    Answer:  metastatic breast ca and CLL, assess response to Rx    Order Specific Question:  Preferred imaging location?    Answer:  [facility]  . MR Brain W Contrast    Standing Status: Future     Number of Occurrences:      Standing Expiration Date: 03/03/2016    Order Specific Question:  Reason for Exam (SYMPTOM  OR DIAGNOSIS REQUIRED)    Answer:  metastatic breast ca, hearing changes, exclude brain mets    Order Specific Question:  Preferred imaging location?    Answer:  [facility]    Order Specific Question:  Does the patient have a pacemaker or implanted devices?    Answer:  No    Order Specific Question:  What is the patient's sedation requirement?    Answer:  No Sedation  . Ambulatory referral to ENT    Referral Priority:  Routine    Referral Type:  Consultation    Referral Reason:  Specialty Services Required    Requested Specialty:  Otolaryngology    Number of Visits Requested:  1   All questions were answered. The patient knows to call the clinic with any problems, questions or concerns. No barriers to learning was detected. I spent 30 minutes counseling the patient face to face. The total time spent in the appointment was 40 minutes and more than 50% was  on counseling and review of test results     [facility], Desteny Freeman, MD 01/02/2015 10:04 AM

## 2015-01-02 NOTE — Assessment & Plan Note (Signed)
Her MRI appears somewhat stable and she has not much symptoms apart from chronic arthritis. I recommend review with radiation oncologist to see if any additional palliative radiation therapy would be beneficial to reduce risk of fracture. He recommended observation She need to be started on XGeva the but had problems with dental issues in the past.   she had dental extraction on 11/29/2014. I recommend waiting a few more weeks with plan to start Xgeva.

## 2015-01-13 ENCOUNTER — Emergency Department (HOSPITAL_COMMUNITY)
Admission: EM | Admit: 2015-01-13 | Discharge: 2015-01-13 | Disposition: A | Discharge status: Home / Self Care | Payer: Medicare Other | Attending: Emergency Medicine | Admitting: Emergency Medicine

## 2015-01-13 ENCOUNTER — Encounter (HOSPITAL_COMMUNITY): Payer: Self-pay | Admitting: Emergency Medicine

## 2015-01-13 ENCOUNTER — Emergency Department (HOSPITAL_COMMUNITY): Payer: Medicare Other

## 2015-01-13 DIAGNOSIS — Z856 Personal history of leukemia: Secondary | ICD-10-CM | POA: Insufficient documentation

## 2015-01-13 DIAGNOSIS — M1990 Unspecified osteoarthritis, unspecified site: Secondary | ICD-10-CM | POA: Diagnosis not present

## 2015-01-13 DIAGNOSIS — Z85830 Personal history of malignant neoplasm of bone: Secondary | ICD-10-CM | POA: Insufficient documentation

## 2015-01-13 DIAGNOSIS — Z923 Personal history of irradiation: Secondary | ICD-10-CM | POA: Diagnosis not present

## 2015-01-13 DIAGNOSIS — Z880 Allergy status to penicillin: Secondary | ICD-10-CM | POA: Insufficient documentation

## 2015-01-13 DIAGNOSIS — Z79899 Other long term (current) drug therapy: Secondary | ICD-10-CM | POA: Diagnosis not present

## 2015-01-13 DIAGNOSIS — I10 Essential (primary) hypertension: Secondary | ICD-10-CM | POA: Diagnosis not present

## 2015-01-13 DIAGNOSIS — M79601 Pain in right arm: Secondary | ICD-10-CM | POA: Diagnosis not present

## 2015-01-13 DIAGNOSIS — Z8572 Personal history of non-Hodgkin lymphomas: Secondary | ICD-10-CM | POA: Diagnosis not present

## 2015-01-13 DIAGNOSIS — Z853 Personal history of malignant neoplasm of breast: Secondary | ICD-10-CM | POA: Diagnosis not present

## 2015-01-13 DIAGNOSIS — R590 Localized enlarged lymph nodes: Secondary | ICD-10-CM | POA: Insufficient documentation

## 2015-01-13 DIAGNOSIS — R221 Localized swelling, mass and lump, neck: Secondary | ICD-10-CM | POA: Diagnosis present

## 2015-01-13 LAB — I-STAT CHEM 8, ED
BUN: 12 mg/dL (ref 6–23)
CALCIUM ION: 1.22 mmol/L (ref 1.13–1.30)
Chloride: 103 mmol/L (ref 96–112)
Creatinine, Ser: 0.8 mg/dL (ref 0.50–1.10)
Glucose, Bld: 93 mg/dL (ref 70–99)
HCT: 35 % — ABNORMAL LOW (ref 36.0–46.0)
Hemoglobin: 11.9 g/dL — ABNORMAL LOW (ref 12.0–15.0)
Potassium: 4 mmol/L (ref 3.5–5.1)
SODIUM: 139 mmol/L (ref 135–145)
TCO2: 24 mmol/L (ref 0–100)

## 2015-01-13 MED ORDER — IOHEXOL 300 MG/ML  SOLN: 100.0000 mL | Freq: Once | INTRAMUSCULAR | Status: DC | PRN

## 2015-01-13 MED ORDER — IOHEXOL 300 MG/ML  SOLN
100.0000 mL | Freq: Once | INTRAMUSCULAR | Status: AC | PRN
  Administered 2015-01-13: 100 mL via INTRAVENOUS

## 2015-01-13 NOTE — ED Provider Notes (Addendum)
CSN: [id number]     Arrival date & time 01/13/15  1608 History   First MD Initiated Contact with Patient 01/13/15 1630     Chief Complaint  Patient presents with  . Arm Pain  . Neck swelling      (Consider location/radiation/quality/duration/timing/severity/associated sxs/prior Treatment) HPI Comments: Pt with anterior neck pain that started today when she woke up and noticed a knot in the neck which was tender.  States pain improved with oxycodone and muscle rub.  Patient thinks that this is related to her shoulder because she had been having shoulder pain for some time and had to open the doors when she went to boat yesterday. She denies any difficulty with swallowing or breathing. No fevers. Patient is a former cancer. Patient and does have a port in her right chest, but states she is no longer getting chemotherapy.  The history is provided by the patient.    Past Medical History  Diagnosis Date  . Arthritis   . Blood transfusion 2009  . Hypertension   . Seizures 1980's    from medication reaction/Pt.  [name] Hx of radiation therapy 09/11/08 -10/31/08    left breast  . History of radiation therapy eot 07/30/12    lumbar spine L4 /l shoulder  . Cough 10/10/2014  . Breast CA 08/2008    (LT) breast ca dx 10/09/Chemo  . Lymphoma 09/09/2011    NHL  . Leukemia, acute, in remission 2012    Pt. not sure of type  . Metastasis to bone 07/03/2012    MRI L spine  . Clicking tinnitus of left ear 01/02/2015   Past Surgical History  Procedure Laterality Date  . Mastectomy    . Thyroidectomy  1965  . Appendectomy    . Tonsillectomy     Family History  Problem Relation Age of Onset  . Alcohol abuse Other   . Drug abuse Other   . Breast cancer Mother   . Prostate cancer Father   . Cancer Father     colon  . Colon cancer Brother   . Esophageal cancer Neg Hx   . Rectal cancer Neg Hx   . Stomach cancer Neg Hx    History  Substance Use Topics  . Smoking status: Never Smoker   .  Smokeless tobacco: Never Used  . Alcohol Use: No   OB History    No data available     Review of Systems  All other systems reviewed and are negative.     Allergies  Codeine; Penicillins; Sulfonamide derivatives; Tape; and Tramadol hcl  Home Medications   Prior to Admission medications   Medication Sig Start Date End Date Taking? Authorizing Provider  fulvestrant (FASLODEX) 250 MG/5ML injection Inject 500 mg into the muscle every 14 (fourteen) days. One injection each buttock over 1-2 minutes. Warm prior to use. 08/15/14   Historical Provider, MD  guaiFENesin-dextromethorphan (ROBITUSSIN DM) 100-10 MG/5ML syrup Take 5 mLs by mouth every 4 (four) hours as needed for cough.    Historical Provider, MD  HYDROcodone-acetaminophen (NORCO) 5-325 MG per tablet Take 1 tablet by mouth every 6 (six) hours as needed for moderate pain. 01/02/15   Ni Gorsuch, MD   BP 146/76 mmHg  Pulse 66  Temp(Src) 98.2 F (36.8 C) (Oral)  Resp 17  SpO2 100% Physical Exam  Constitutional: She is oriented to person, place, and time. She appears well-developed and well-nourished. No distress.  HENT:  Head: Normocephalic and atraumatic.  Mouth/Throat: Oropharynx is clear  and moist.  Eyes: Conjunctivae and EOM are normal. Pupils are equal, round, and reactive to light.  Neck: Normal range of motion and phonation normal. Neck supple. No tracheal deviation and normal range of motion present. No thyromegaly present.    Cardiovascular: Normal rate, regular rhythm and intact distal pulses.   No murmur heard. Pulmonary/Chest: Effort normal and breath sounds normal. No stridor. No respiratory distress. She has no wheezes. She has no rales.  Port palpated in the right upper chest  Abdominal: Soft. She exhibits no distension. There is no tenderness. There is no rebound and no guarding.  Musculoskeletal: Normal range of motion. She exhibits no edema or tenderness.       Right shoulder: Normal.  No right hand or  upper arm swelling  Neurological: She is alert and oriented to person, place, and time.  Skin: Skin is warm and dry. No rash noted. No erythema.  Psychiatric: She has a normal mood and affect. Her behavior is normal.  Nursing note and vitals reviewed.   ED Course  Procedures (including critical care time) Labs Review Labs Reviewed  I-STAT CHEM 8, ED - Abnormal; Notable for the following:    Hemoglobin 11.9 (*)    HCT 35.0 (*)    All other components within normal limits    Imaging Review Ct Soft Tissue Neck W Contrast  01/13/2015   CLINICAL DATA:  Pt reports right neck/clavicle swelling and right arm pain. Says, "I went to vote yesterday and I think I strained my neck and arm yesterday opening those heavy doors." Patient has visible swelling above right clavicle. Has full ROM with right and left arm. Denies hx stroke. Has right PAC -not actively receiving chemotherapy but receives "legs in both shots for the breast cancer." No obvious neurological deficits. Per ordering provider palpable mass near [facility] joint on RT side. History of breast cancer and lymphoma.  EXAM: CT NECK WITH CONTRAST  TECHNIQUE: Multidetector CT imaging of the neck was performed using the standard protocol following the bolus administration of intravenous contrast.  CONTRAST:  139mL OMNIPAQUE IOHEXOL 300 MG/ML  SOLN  COMPARISON:  05/13/2011 and 11/12/2012  FINDINGS: Pharynx and larynx: Within normal.  Salivary glands: Within normal.  Thyroid: Within normal.  Lymph nodes: There is new extensive bulky adenopathy over the right supraclavicular fossa and anterior neck base extending into the right axillary region. There are 3 adjacent lymph nodes corresponding to the area of patient's palpable abnormality over the anterior right neck base/ supraclavicular fossa measuring 1.8, 2.1 and 1.9 cm by short axis respectively. There is a right axillary node measuring 3.1 cm by short axis as the axillary adenopathy is not fully evaluated on  this exam. These findings are compatible metastatic disease.  Vascular: Minimal calcified plaque at the carotid bifurcations without hemodynamically significant stenosis. Calcified plaque over the aortic arch.  Limited intracranial: Within normal.  Visualized orbits: Within normal.  Mastoids and visualized paranasal sinuses: Minimal mucosal membrane thickening over the right maxillary sinus, otherwise within normal.  Skeleton: Degenerative changes of the cervical spine with moderate disc disease at the C5-6 level.  Upper chest: Stable left apical opacification likely post radiation changes.  IMPRESSION: New extensive bulky adenopathy over the right anterior neck base and supraclavicular fossa extending into the right axilla and correlating to patient's palpable abnormality. Findings are compatible with metastatic disease in this patient with a known history of lymphoma and breast cancer.  Other stable ancillary findings as described.   Electronically Signed  By: Marin Olp M.D.   On: 01/13/2015 19:01     EKG Interpretation None      MDM   Final diagnoses:  Localized enlarged lymph nodes    Patient with right distal anterior neck mass which she states just started today. Patient has a golf ball size mass just to the proximal to the right clavicle. It is mobile, but also multiple nodes are palpated. Patient states that just occurred this morning when she woke up from bed, but concerned that it may have been there longer. Especially, the patient's prior history of cancer. She denies any breathing or swallowing difficulty. No fever or infectious symptoms. Patient shoulder seems to have arthritis, but otherwise is normal. Low suspicion for blood clot.  CT of anterior neck to evaluate.  7:20 PM CT shows new extensive bulky adenopathy over the right anterior neck base and supraclavicular fossa, concerning for metastatic disease. Patient has follow-up with her oncologist this month, however,  recommended she call them on Monday to see if she needs a sooner appointment.  Blanchie Dessert, MD 01/13/15 1921  Blanchie Dessert, MD 01/13/15 [place]

## 2015-01-13 NOTE — Discharge Instructions (Signed)

## 2015-01-13 NOTE — ED Notes (Signed)
Pt reports right neck/clavicle swelling and right arm pain. Says, "I went to vote yesterday and I think I strained my neck and arm yesterday opening those heavy doors." Patient has visible swelling above right clavicle. Has full ROM with right and left arm. Denies hx stroke. Has right PAC -not actively receiving chemotherapy but receives "legs in both shots for the breast cancer." No obvious neurological deficits. Speaking full/clear sentences. RR even/unlabored.

## 2015-01-15 ENCOUNTER — Telehealth: Payer: Self-pay | Admitting: *Deleted

## 2015-01-15 NOTE — Telephone Encounter (Signed)
Called Mrs. Schweers and relayed to her that she should keep the appointment tomorrow with her dentist.  She agreed to do so.

## 2015-01-15 NOTE — Telephone Encounter (Signed)
Baker Janus, I am in the process of moving up all her imaging scans. She has a chronic teeth issue and that still need to be addressed so I would advise her to keep her long awaited appointment with the dentist

## 2015-01-15 NOTE — Telephone Encounter (Signed)
Helen [name] called to report having gone to the ED over the weekend due to "a swelling in her neck."  She said she noticed a swelling in her right axilla this morning.  She did have a CT Soft Tissue Neck over the weekend that revealing bulky lymphadenopathy.  The patient is concerned that given this situation she might not ought to go to the dentist tomorrow.  She is scheduled to have a procedure on the bone where she had teeth extracted that is not healing properly.  She is also very concerned about the multiple enlarged lymph nodes. Please advise.

## 2015-01-16 ENCOUNTER — Telehealth: Payer: Self-pay | Admitting: Hematology and Oncology

## 2015-01-16 NOTE — Telephone Encounter (Signed)
left voicemail for patient with appt for 3.18

## 2015-01-17 ENCOUNTER — Encounter: Payer: Self-pay | Admitting: *Deleted

## 2015-01-17 NOTE — Progress Notes (Signed)
[place] Work  Clinical Social Work was referred by Futures trader for trasnportation concerns.  Clinical Social Worker contacted patient by phone to offer support and assess for needs.  Ms. Helen [name] shared she has no way to get to her appointments tomorrow and understands it is important she does not miss appointments.  CSW arranged taxi for 545 and faxed voucher to [facility] taxi.  CSW will provide voucher for ride home to [place] in [place].    Ms. Helen [name] shared she has a ride to Friday's appointment to see Dr. Alvy Bimler.  CSW will follow up with patient to determine future transportation options.  Polo Riley, MSW, LCSW, OSW-C Clinical Social Worker [facility] [phone]

## 2015-01-18 ENCOUNTER — Other Ambulatory Visit: Payer: Self-pay | Admitting: Hematology and Oncology

## 2015-01-18 ENCOUNTER — Ambulatory Visit (HOSPITAL_COMMUNITY)
Admission: RE | Admit: 2015-01-18 | Discharge: 2015-01-18 | Disposition: A | Discharge status: Home / Self Care | Payer: Medicare Other | Source: Ambulatory Visit | Attending: Hematology and Oncology | Admitting: Hematology and Oncology

## 2015-01-18 ENCOUNTER — Encounter (HOSPITAL_COMMUNITY): Payer: Self-pay

## 2015-01-18 DIAGNOSIS — Z853 Personal history of malignant neoplasm of breast: Secondary | ICD-10-CM | POA: Insufficient documentation

## 2015-01-18 DIAGNOSIS — Z9221 Personal history of antineoplastic chemotherapy: Secondary | ICD-10-CM | POA: Insufficient documentation

## 2015-01-18 DIAGNOSIS — C7801 Secondary malignant neoplasm of right lung: Secondary | ICD-10-CM

## 2015-01-18 DIAGNOSIS — Z9012 Acquired absence of left breast and nipple: Secondary | ICD-10-CM | POA: Diagnosis not present

## 2015-01-18 DIAGNOSIS — C7802 Secondary malignant neoplasm of left lung: Secondary | ICD-10-CM

## 2015-01-18 DIAGNOSIS — Z8572 Personal history of non-Hodgkin lymphomas: Secondary | ICD-10-CM | POA: Insufficient documentation

## 2015-01-18 DIAGNOSIS — H9312 Tinnitus, left ear: Secondary | ICD-10-CM

## 2015-01-18 DIAGNOSIS — Z923 Personal history of irradiation: Secondary | ICD-10-CM | POA: Insufficient documentation

## 2015-01-18 DIAGNOSIS — C50919 Malignant neoplasm of unspecified site of unspecified female breast: Secondary | ICD-10-CM | POA: Diagnosis present

## 2015-01-18 DIAGNOSIS — C50912 Malignant neoplasm of unspecified site of left female breast: Secondary | ICD-10-CM

## 2015-01-18 MED ORDER — GADOBENATE DIMEGLUMINE 529 MG/ML IV SOLN
15.0000 mL | Freq: Once | INTRAVENOUS | Status: AC | PRN
  Administered 2015-01-18: 13 mL via INTRAVENOUS

## 2015-01-18 MED ORDER — IOHEXOL 300 MG/ML  SOLN
100.0000 mL | Freq: Once | INTRAMUSCULAR | Status: AC | PRN
  Administered 2015-01-18: 100 mL via INTRAVENOUS

## 2015-01-19 ENCOUNTER — Encounter: Payer: Self-pay | Admitting: Hematology and Oncology

## 2015-01-19 ENCOUNTER — Telehealth: Payer: Self-pay | Admitting: *Deleted

## 2015-01-19 ENCOUNTER — Ambulatory Visit (HOSPITAL_BASED_OUTPATIENT_CLINIC_OR_DEPARTMENT_OTHER): Payer: Medicare Other | Admitting: Hematology and Oncology

## 2015-01-19 ENCOUNTER — Other Ambulatory Visit: Payer: Self-pay | Admitting: *Deleted

## 2015-01-19 ENCOUNTER — Encounter: Payer: Self-pay | Admitting: *Deleted

## 2015-01-19 ENCOUNTER — Telehealth: Payer: Self-pay | Admitting: Hematology and Oncology

## 2015-01-19 VITALS — BP 138/67 | HR 92 | Temp 98.0°F | Resp 18 | Ht 62.0 in | Wt 134.4 lb

## 2015-01-19 DIAGNOSIS — C8300 Small cell B-cell lymphoma, unspecified site: Secondary | ICD-10-CM

## 2015-01-19 DIAGNOSIS — C8359 Lymphoblastic (diffuse) lymphoma, extranodal and solid organ sites: Secondary | ICD-10-CM

## 2015-01-19 DIAGNOSIS — C50912 Malignant neoplasm of unspecified site of left female breast: Secondary | ICD-10-CM

## 2015-01-19 DIAGNOSIS — C7951 Secondary malignant neoplasm of bone: Secondary | ICD-10-CM

## 2015-01-19 MED ORDER — PREDNISONE 20 MG PO TABS: 20.0000 mg | ORAL_TABLET | Freq: Every day | ORAL | Status: DC

## 2015-01-19 MED ORDER — IBRUTINIB 140 MG PO CAPS: 420.0000 mg | ORAL_CAPSULE | Freq: Every day | ORAL | Status: DC

## 2015-01-19 MED ORDER — LIDOCAINE-PRILOCAINE 2.5-2.5 % EX CREA: 1.0000 "application " | TOPICAL_CREAM | CUTANEOUS | Status: DC | PRN

## 2015-01-19 NOTE — Assessment & Plan Note (Signed)
She has significant disease progression in the liver. We have rising tumor marker, I felt that her breast cancer is not under control. She has been treated with multiple hormonal treatment. I understand that the patient would like to avoid chemotherapy as much as possible. However, with significant burden of disease, I recommend she resume chemotherapy. The patient wants something that might not cause alopecia. I recommend gemcitabine treatment every 14 days to accommodate for her difficulties with transportation. We discussed the risk, benefit, side effects of chemotherapy in the presence of her granddaughter and ultimately she agreed to proceed.

## 2015-01-19 NOTE — Telephone Encounter (Signed)
Gave avs  & calendar for April. Sent message to schedule treatment, will call once schedule.

## 2015-01-19 NOTE — Progress Notes (Signed)
I faxed prior auth for imbruvica 140mg  capsules 90/30 to [facility]

## 2015-01-19 NOTE — Progress Notes (Signed)
[facility] OFFICE PROGRESS NOTE  Patient Care Team: No Pcp Per Patient as PCP - General (General Practice) [name], MD as Consulting Physician (Hematology and Oncology)  SUMMARY OF ONCOLOGIC HISTORY:   Cancer of left breast   08/03/2008 Initial Diagnosis Breast CA   11/06/2014 Imaging Repeat CT scan of the chest, abdomen and pelvis show regression in the size of liver metastasis and pulmonary metastasis with mild progression of lymphadenopathy   11/06/2014 Tumor Marker [id number] is elevated at 275   01/18/2015 Imaging MRI head is negative for metastatic disease   01/18/2015 Imaging CT scan of the chest, abdomen and pelvis show significant disease progression throughout.    INTERVAL HISTORY: Please see below for problem oriented charting. She is present today with her granddaughter. The patient cannot understand what is going on. She felt that the lymphadenopathy in her neck and her right axilla could be due to recent infection. She denies recent weight loss. She denies pain.  REVIEW OF SYSTEMS:   Constitutional: Denies fevers, chills or abnormal weight loss Eyes: Denies blurriness of vision Ears, nose, mouth, throat, and face: Denies mucositis or sore throat Respiratory: Denies cough, dyspnea or wheezes Cardiovascular: Denies palpitation, chest discomfort or lower extremity swelling Gastrointestinal:  Denies nausea, heartburn or change in bowel habits Skin: Denies abnormal skin rashes Neurological:Denies numbness, tingling or new weaknesses Behavioral/Psych: Mood is stable, no new changes  All other systems were reviewed with the patient and are negative.  I have reviewed the past medical history, past surgical history, social history and family history with the patient and they are unchanged from previous note.  ALLERGIES:  is allergic to codeine; penicillins; sulfonamide derivatives; tape; and tramadol hcl.  MEDICATIONS:  Current Outpatient Prescriptions   Medication Sig Dispense Refill  . azithromycin (ZITHROMAX) 500 MG tablet Take 500 mg by mouth daily.    . fulvestrant (FASLODEX) 250 MG/5ML injection Inject 500 mg into the muscle every 14 (fourteen) days. One injection each buttock over 1-2 minutes. Warm prior to use.    [name] HYDROcodone-acetaminophen (NORCO) 5-325 MG per tablet Take 1 tablet by mouth every 6 (six) hours as needed for moderate pain. 60 tablet 0  . ibrutinib (IMBRUVICA) 140 MG capsul Take 3 capsules (420 mg total) by mouth daily. 90 capsule 0  . lidocaine-prilocaine (EMLA) cream Apply 1 application topically as needed. 30 g 6  . predniSONE (DELTASONE) 20 MG tablet Take 1 tablet (20 mg total) by mouth daily with breakfast. 10 tablet 0   No current facility-administered medications for this visit.    PHYSICAL EXAMINATION: ECOG PERFORMANCE STATUS: 1 - Symptomatic but completely ambulatory  Filed Vitals:   01/19/15 1136  BP: 138/67  Pulse: 92  Temp: 98 F (36.7 C)  Resp: 18   Filed Weights   01/19/15 1136  Weight: 134 lb 6.4 oz (60.963 kg)    GENERAL:alert, no distress and comfortable SKIN: skin color, texture, turgor are normal, no rashes or significant lesions EYES: normal, Conjunctiva are pink and non-injected, sclera clear OROPHARYNX:no exudate, no erythema and lips, buccal mucosa, and tongue normal  NECK: supple, thyroid normal size, non-tender, without nodularity LYMPH:  Significant palpable lymphadenopathy in the neck and the axilla. LUNGS: clear to auscultation and percussion with normal breathing effort HEART: regular rate & rhythm and no murmurs and no lower extremity edema ABDOMEN:abdomen soft, non-tender and normal bowel sounds Musculoskeletal:no cyanosis of digits and no clubbing  NEURO: alert & oriented x 3 with fluent speech, no focal motor/sensory  deficits  LABORATORY DATA:  I have reviewed the data as listed    Component Value Date/Time   NA 139 01/13/2015 1730   NA 142 01/02/2015 0917   NA  142 06/01/2012 1008   K 4.0 01/13/2015 1730   K 3.7 01/02/2015 0917   K 3.8 06/01/2012 1008   CL 103 01/13/2015 1730   CL 107 04/22/2013 0922   CL 103 06/01/2012 1008   CO2 25 01/02/2015 0917   CO2 25 06/13/2014 0819   CO2 25 06/01/2012 1008   GLUCOSE 93 01/13/2015 1730   GLUCOSE 98 01/02/2015 0917   GLUCOSE 89 04/22/2013 0922   GLUCOSE 108 06/01/2012 1008   BUN 12 01/13/2015 1730   BUN 10.2 01/02/2015 0917   BUN 9 06/01/2012 1008   CREATININE 0.80 01/13/2015 1730   CREATININE 0.7 01/02/2015 0917   CREATININE 0.9 06/01/2012 1008   CALCIUM 10.1 01/02/2015 0917   CALCIUM 10.8* 06/13/2014 0819   CALCIUM 9.8 06/01/2012 1008   PROT 6.7 01/02/2015 0917   PROT 7.5 06/13/2014 0819   PROT 8.1 08/14/2010 1251   ALBUMIN 3.6 01/02/2015 0917   ALBUMIN 3.9 06/13/2014 0819   AST 21 01/02/2015 0917   AST 23 06/13/2014 0819   AST 19 08/14/2010 1251   ALT 17 01/02/2015 0917   ALT 13 06/13/2014 0819   ALT 9* 08/14/2010 1251   ALKPHOS 96 01/02/2015 0917   ALKPHOS 89 06/13/2014 0819   ALKPHOS 78 08/14/2010 1251   BILITOT 0.27 01/02/2015 0917   BILITOT 0.2* 06/13/2014 0819   BILITOT 0.40 08/14/2010 1251   GFRNONAA 87* 07/06/2012 1122   GFRAA >90 07/06/2012 1122    No results found for: SPEP, UPEP  Lab Results  Component Value Date   WBC 3.4* 01/02/2015   NEUTROABS 2.3 01/02/2015   HGB 11.9* 01/13/2015   HCT 35.0* 01/13/2015   MCV 85.7 01/02/2015   PLT 265 01/02/2015      Chemistry      Component Value Date/Time   NA 139 01/13/2015 1730   NA 142 01/02/2015 0917   NA 142 06/01/2012 1008   K 4.0 01/13/2015 1730   K 3.7 01/02/2015 0917   K 3.8 06/01/2012 1008   CL 103 01/13/2015 1730   CL 107 04/22/2013 0922   CL 103 06/01/2012 1008   CO2 25 01/02/2015 0917   CO2 25 06/13/2014 0819   CO2 25 06/01/2012 1008   BUN 12 01/13/2015 1730   BUN 10.2 01/02/2015 0917   BUN 9 06/01/2012 1008   CREATININE 0.80 01/13/2015 1730   CREATININE 0.7 01/02/2015 0917   CREATININE 0.9  06/01/2012 1008      Component Value Date/Time   CALCIUM 10.1 01/02/2015 0917   CALCIUM 10.8* 06/13/2014 0819   CALCIUM 9.8 06/01/2012 1008   ALKPHOS 96 01/02/2015 0917   ALKPHOS 89 06/13/2014 0819   ALKPHOS 78 08/14/2010 1251   AST 21 01/02/2015 0917   AST 23 06/13/2014 0819   AST 19 08/14/2010 1251   ALT 17 01/02/2015 0917   ALT 13 06/13/2014 0819   ALT 9* 08/14/2010 1251   BILITOT 0.27 01/02/2015 0917   BILITOT 0.2* 06/13/2014 0819   BILITOT 0.40 08/14/2010 1251       RADIOGRAPHIC STUDIES: I have personally reviewed the radiological images as listed and agreed with the findings in the report. Ct Chest W Contrast  01/18/2015   CLINICAL DATA:  Subsequent evaluation of a 80 year old female with history of left-sided breast cancer in 2004  status post left mastectomy, as well as lymphoma and leukemia in 2012 status post chemotherapy and radiation therapy, undergoing ongoing chemotherapy.  EXAM: CT CHEST, ABDOMEN, AND PELVIS WITH CONTRAST  TECHNIQUE: Multidetector CT imaging of the chest, abdomen and pelvis was performed following the standard protocol during bolus administration of intravenous contrast.  CONTRAST:  140mL OMNIPAQUE IOHEXOL 300 MG/ML  SOLN  COMPARISON:  CT of the chest, abdomen and pelvis 11/06/2014.  FINDINGS: CT CHEST FINDINGS  Mediastinum/Lymph Nodes: Heart size is normal. There is no significant pericardial fluid, thickening or pericardial calcification. Multiple borderline enlarged and mildly enlarged mediastinal lymph nodes, including a high right paratracheal node measuring 11 mm in short axis, and a subcarinal lymph node measuring 10 mm in short axis. Enlarged lymph nodes in the juxta pericardiac region measuring up to 11 mm in short axis, similar to the prior examination. Esophagus is normal in appearance. Massively enlarged right axillary and subpectoral lymph nodes, significantly increased compared to the prior examination, with the largest lymph node currently  measuring 3.8 cm in short axis. No axillary lymphadenopathy on the left side. Extensive supraclavicular lymphadenopathy also noted, but incompletely visualized, with nodes measuring up to 1.7 cm in short axis.  Lungs/Pleura: Previously noted left lower lobe pulmonary nodule has decreased in size, currently measuring 10 x 7 mm (image 38 of series 4). Previously noted nodule in the medial segment of the right middle lobe is unchanged in size measuring 10 mm (image 35 of series 4). Postradiation changes are noted in the apex of the left upper lobe and in the subpleural aspect of the anterior left upper lobe deep to the left mastectomy site. No acute consolidative airspace disease. No pleural effusions. No new suspicious appearing pulmonary nodules or masses are otherwise noted.  Musculoskeletal/Soft Tissues: There are no aggressive appearing lytic or blastic lesions noted in the visualized portions of the skeleton. Status post left modified radical mastectomy.  CT ABDOMEN AND PELVIS FINDINGS  Hepatobiliary: Several liver lesions are again noted, and generally appear larger than the prior examination, with the largest of these in the periphery of segments 5 and 6 (image 48 of series 2) currently measuring 3.5 x 2.6 cm (previously 2.7 x 2.4 cm on 11/06/2014). In addition, there are lesions in the left lobe of the liver which are either new or more apparent than the prior study, the largest of which is in segment 4A measuring 13 x 12 mm on today's examination (image 42 of series 2). No intra or extrahepatic biliary ductal dilatation. Partially calcified gallstone again noted in the lumen of the gallbladder measuring up to 2.9 cm. Gallbladder is largely decompressed.  Pancreas: Unremarkable.  Spleen: Unremarkable.  Adrenals/Urinary Tract: Bilateral adrenal glands are normal in appearance. Multiple sub cm low-attenuation lesions in the kidneys bilaterally are too small to definitively characterize, but are statistically  likely cysts. 19 mm simple cyst in the lower pole of the left kidney. No hydroureteronephrosis to indicate urinary tract obstruction at this time. The appearance of the urinary bladder is normal.  Stomach/Bowel: Normal appearance of the stomach. No pathologic dilatation of small bowel or colon. Numerous colonic diverticulae are noted in the sigmoid colon, without surrounding inflammatory changes to suggest an acute diverticulitis at this time.  Vascular/Lymphatic: Atherosclerosis throughout the abdominal and pelvic vasculature, without evidence of aneurysm or dissection. No abdominal lymphadenopathy. Numerous borderline enlarged and enlarged pelvic lymph nodes, largest of which is in the left external iliac nodal distribution measuring 20 mm in short axis (image  85 of series 2), increased from 12 mm on the prior study. Multiple enlarged bilateral inguinal lymph nodes are also noted (left greater than right), measuring up to 21 mm in short axis, increased from the prior (previously 15 mm short axis).  Reproductive: Heterogeneous appearance of the uterus with multiple densely calcified lesions again noted, compatible with a fibroid uterus. Ovaries are atrophic.  Other: No significant volume of ascites.  No pneumoperitoneum.  Musculoskeletal: Again noted are multiple aggressive appearing predominantly lytic lesions in the pelvis bilaterally. The largest of these is in the superior aspect of the left iliac wing, with adjacent soft tissue component measuring 3.0 x 5.0 cm, similar to the prior examination. Lytic lesion in the L4 vertebral body is also very similar to prior examinations, measuring 2.8 cm in diameter. Bilateral pars defects at L5 with 13 mm of anterolisthesis of L5 upon S1.  IMPRESSION: 1. Today's study demonstrates general progression of disease, with worsening lymphadenopathy in right supraclavicular, right axillary, mediastinal and pelvic nodal stations, interval enlargement of multiple hepatic lesions  and potential new lesions in the left lobe of the liver, and similar appearance of osseous metastatic disease, as detailed above. 2. Previously noted pulmonary nodules are stable in size. 3. Cholelithiasis without evidence of acute cholecystitis at this time. 4. Additional findings, as above, similar to prior studies.   Electronically Signed   By: Vinnie Langton M.D.   On: 01/18/2015 11:29   Mr Helen [name] AY Contrast  01/18/2015   CLINICAL DATA:  80 year old female with metastatic breast cancer. Hearing changes, left tinnitus. Staging. Subsequent encounter.  EXAM: MRI HEAD WITHOUT AND WITH CONTRAST  TECHNIQUE: Multiplanar, multiecho pulse sequences of the brain and surrounding structures were obtained without and with intravenous contrast.  CONTRAST:  65mL MULTIHANCE GADOBENATE DIMEGLUMINE 529 MG/ML IV SOLN  COMPARISON:  Neck CT with contrast 01/13/2015. Cervical spine MRI 08/11/2014.  FINDINGS: No abnormal enhancement identified. No dural thickening identified. Dedicated internal auditory canal imaging also performed. Normal bilateral cisternal and intracanalicular 7th and 8th cranial nerves segments. Preserved T2 signal in the bilateral cochlea and vestibular structures. Mastoids are clear. No abnormal internal auditory enhancement identified. Normal stylomastoid foramina. Parotid glands are within normal limits.  No restricted diffusion to suggest acute infarction. No midline shift, mass effect, evidence of mass lesion, ventriculomegaly, extra-axial collection or acute intracranial hemorrhage. Cervicomedullary junction and pituitary are within normal limits.  Scattered and patchy cerebral white matter, basal ganglia, and central pons T2 and FLAIR hyperintensity is nonspecific. No associated mass effect or enhancement. No cortical encephalomalacia. No chronic blood products identified in the brain. Major intracranial vascular flow voids are preserved.  Visualized bone marrow signal is within normal limits.  Postoperative changes to the globes. Mild paranasal sinus mucosal thickening. Visualized scalp soft tissues are within normal limits. Grossly negative visualized cervical spinal cord.  IMPRESSION: 1.  No acute or metastatic intracranial abnormality. 2. Negative IAC imaging. 3. Moderate nonspecific signal changes in the brain, favor chronic small vessel disease.   Electronically Signed   By: Genevie Ann M.D.   On: 01/18/2015 08:40   Ct Abdomen Pelvis W Contrast  01/18/2015   CLINICAL DATA:  Subsequent evaluation of a 80 year old female with history of left-sided breast cancer in 2004 status post left mastectomy, as well as lymphoma and leukemia in 2012 status post chemotherapy and radiation therapy, undergoing ongoing chemotherapy.  EXAM: CT CHEST, ABDOMEN, AND PELVIS WITH CONTRAST  TECHNIQUE: Multidetector CT imaging of the chest, abdomen and pelvis was  performed following the standard protocol during bolus administration of intravenous contrast.  CONTRAST:  152mL OMNIPAQUE IOHEXOL 300 MG/ML  SOLN  COMPARISON:  CT of the chest, abdomen and pelvis 11/06/2014.  FINDINGS: CT CHEST FINDINGS  Mediastinum/Lymph Nodes: Heart size is normal. There is no significant pericardial fluid, thickening or pericardial calcification. Multiple borderline enlarged and mildly enlarged mediastinal lymph nodes, including a high right paratracheal node measuring 11 mm in short axis, and a subcarinal lymph node measuring 10 mm in short axis. Enlarged lymph nodes in the juxta pericardiac region measuring up to 11 mm in short axis, similar to the prior examination. Esophagus is normal in appearance. Massively enlarged right axillary and subpectoral lymph nodes, significantly increased compared to the prior examination, with the largest lymph node currently measuring 3.8 cm in short axis. No axillary lymphadenopathy on the left side. Extensive supraclavicular lymphadenopathy also noted, but incompletely visualized, with nodes measuring up to  1.7 cm in short axis.  Lungs/Pleura: Previously noted left lower lobe pulmonary nodule has decreased in size, currently measuring 10 x 7 mm (image 38 of series 4). Previously noted nodule in the medial segment of the right middle lobe is unchanged in size measuring 10 mm (image 35 of series 4). Postradiation changes are noted in the apex of the left upper lobe and in the subpleural aspect of the anterior left upper lobe deep to the left mastectomy site. No acute consolidative airspace disease. No pleural effusions. No new suspicious appearing pulmonary nodules or masses are otherwise noted.  Musculoskeletal/Soft Tissues: There are no aggressive appearing lytic or blastic lesions noted in the visualized portions of the skeleton. Status post left modified radical mastectomy.  CT ABDOMEN AND PELVIS FINDINGS  Hepatobiliary: Several liver lesions are again noted, and generally appear larger than the prior examination, with the largest of these in the periphery of segments 5 and 6 (image 48 of series 2) currently measuring 3.5 x 2.6 cm (previously 2.7 x 2.4 cm on 11/06/2014). In addition, there are lesions in the left lobe of the liver which are either new or more apparent than the prior study, the largest of which is in segment 4A measuring 13 x 12 mm on today's examination (image 42 of series 2). No intra or extrahepatic biliary ductal dilatation. Partially calcified gallstone again noted in the lumen of the gallbladder measuring up to 2.9 cm. Gallbladder is largely decompressed.  Pancreas: Unremarkable.  Spleen: Unremarkable.  Adrenals/Urinary Tract: Bilateral adrenal glands are normal in appearance. Multiple sub cm low-attenuation lesions in the kidneys bilaterally are too small to definitively characterize, but are statistically likely cysts. 19 mm simple cyst in the lower pole of the left kidney. No hydroureteronephrosis to indicate urinary tract obstruction at this time. The appearance of the urinary bladder is  normal.  Stomach/Bowel: Normal appearance of the stomach. No pathologic dilatation of small bowel or colon. Numerous colonic diverticulae are noted in the sigmoid colon, without surrounding inflammatory changes to suggest an acute diverticulitis at this time.  Vascular/Lymphatic: Atherosclerosis throughout the abdominal and pelvic vasculature, without evidence of aneurysm or dissection. No abdominal lymphadenopathy. Numerous borderline enlarged and enlarged pelvic lymph nodes, largest of which is in the left external iliac nodal distribution measuring 20 mm in short axis (image 85 of series 2), increased from 12 mm on the prior study. Multiple enlarged bilateral inguinal lymph nodes are also noted (left greater than right), measuring up to 21 mm in short axis, increased from the prior (previously 15 mm short axis).  Reproductive: Heterogeneous appearance of the uterus with multiple densely calcified lesions again noted, compatible with a fibroid uterus. Ovaries are atrophic.  Other: No significant volume of ascites.  No pneumoperitoneum.  Musculoskeletal: Again noted are multiple aggressive appearing predominantly lytic lesions in the pelvis bilaterally. The largest of these is in the superior aspect of the left iliac wing, with adjacent soft tissue component measuring 3.0 x 5.0 cm, similar to the prior examination. Lytic lesion in the L4 vertebral body is also very similar to prior examinations, measuring 2.8 cm in diameter. Bilateral pars defects at L5 with 13 mm of anterolisthesis of L5 upon S1.  IMPRESSION: 1. Today's study demonstrates general progression of disease, with worsening lymphadenopathy in right supraclavicular, right axillary, mediastinal and pelvic nodal stations, interval enlargement of multiple hepatic lesions and potential new lesions in the left lobe of the liver, and similar appearance of osseous metastatic disease, as detailed above. 2. Previously noted pulmonary nodules are stable in size.  3. Cholelithiasis without evidence of acute cholecystitis at this time. 4. Additional findings, as above, similar to prior studies.   Electronically Signed   By: Vinnie Langton M.D.   On: 01/18/2015 11:29     ASSESSMENT & PLAN:  Cancer of left breast  She has significant disease progression in the liver. We have rising tumor marker, I felt that her breast cancer is not under control. She has been treated with multiple hormonal treatment. I understand that the patient would like to avoid chemotherapy as much as possible. However, with significant burden of disease, I recommend she resume chemotherapy. The patient wants something that might not cause alopecia. I recommend gemcitabine treatment every 14 days to accommodate for her difficulties with transportation. We discussed the risk, benefit, side effects of chemotherapy in the presence of her granddaughter and ultimately she agreed to proceed.   Lymphoma, small lymphocytic This is a challenging case whereby the patient had both diagnosis of metastatic breast cancer and CLL/SLL She has significant progression of lymphadenopathy since we discontinued treatment for SLL/CLL. Certainly, some of the lymphadenopathy could be related to breast cancer or CLL. However, she had multiple biopsies in the past that confirmed it is still CLL I have discussed with the patient various treatment options. Majority of them involve chemotherapeutic agent that could cause risk of toxicity and alopecia. After a very prolonged discussion, she is in agreement to be started on Ibrutinib. I discussed with her the risk, benefit, side effects of Ibrutinib and she agreed to proceed.   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 She had recent dental extraction. I will proceed with treatment with Xgeva next month.    Orders Placed This Encounter  Procedures  . CBC with Differential    Standing Status: Standing     Number  of Occurrences: 20     Standing Expiration Date: 01/20/2016  . Comprehensive metabolic panel    Standing Status: Standing     Number of Occurrences: 20     Standing Expiration Date: 01/20/2016   All questions were answered. The patient knows to call the clinic with any problems, questions or concerns. No barriers to learning was detected. I spent 40 minutes counseling the patient face to face. The total time spent in the appointment was 55 minutes and more than 50% was on counseling and review of test results     [facility], [place], MD 01/19/2015 3:38 PM

## 2015-01-19 NOTE — Assessment & Plan Note (Signed)
This is a challenging case whereby the patient had both diagnosis of metastatic breast cancer and CLL/SLL She has significant progression of lymphadenopathy since we discontinued treatment for SLL/CLL. Certainly, some of the lymphadenopathy could be related to breast cancer or CLL. However, she had multiple biopsies in the past that confirmed it is still CLL I have discussed with the patient various treatment options. Majority of them involve chemotherapeutic agent that could cause risk of toxicity and alopecia. After a very prolonged discussion, she is in agreement to be started on Ibrutinib. I discussed with her the risk, benefit, side effects of Ibrutinib and she agreed to proceed.

## 2015-01-19 NOTE — Progress Notes (Signed)
[place] Work  Clinical Social Work met with patient and patient's granddaughter in [place] office.  CSW called Medicaid transportation on patient's behalf to determine issue with patient utilizing services.  CSW resolved issues and patient was relieved to continuing using service to get to medical appointments.  Polo Riley, MSW, LCSW, OSW-C Clinical Social Worker [facility] [phone]

## 2015-01-19 NOTE — Telephone Encounter (Signed)
[place] faxed Prior authorization request for [employer].  Request to Managed Care for review.

## 2015-01-19 NOTE — Assessment & Plan Note (Signed)
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 She had recent dental extraction. I will proceed with treatment with Xgeva next month.

## 2015-01-19 NOTE — Telephone Encounter (Signed)
Rx for Ibrutinib faxed to [facility] outpatient pharmacy.   P.A. Given to [facility] in [place] care dept.[name]

## 2015-01-22 ENCOUNTER — Telehealth: Payer: Self-pay | Admitting: Hematology and Oncology

## 2015-01-22 NOTE — Telephone Encounter (Signed)
Confirm appointment for 03/24.

## 2015-01-23 ENCOUNTER — Encounter: Payer: Self-pay | Admitting: Hematology and Oncology

## 2015-01-23 NOTE — Progress Notes (Signed)
Per [employer] Imbruvica 140mg  capsule 90/30 approved 01/19/15-01/18/17. I will send to medical records.

## 2015-01-25 ENCOUNTER — Ambulatory Visit (HOSPITAL_BASED_OUTPATIENT_CLINIC_OR_DEPARTMENT_OTHER): Payer: Medicare Other

## 2015-01-25 DIAGNOSIS — C8300 Small cell B-cell lymphoma, unspecified site: Secondary | ICD-10-CM

## 2015-01-25 DIAGNOSIS — Z5111 Encounter for antineoplastic chemotherapy: Secondary | ICD-10-CM

## 2015-01-25 DIAGNOSIS — C7951 Secondary malignant neoplasm of bone: Secondary | ICD-10-CM

## 2015-01-25 DIAGNOSIS — C50912 Malignant neoplasm of unspecified site of left female breast: Secondary | ICD-10-CM | POA: Diagnosis not present

## 2015-01-25 MED ORDER — SODIUM CHLORIDE 0.9 % IV SOLN
800.0000 mg/m2 | Freq: Once | INTRAVENOUS | Status: AC
  Administered 2015-01-25: 1292 mg via INTRAVENOUS
  Filled 2015-01-25: qty 33.98

## 2015-01-25 MED ORDER — PROCHLORPERAZINE MALEATE 10 MG PO TABS
10.0000 mg | ORAL_TABLET | Freq: Once | ORAL | Status: AC
  Administered 2015-01-25: 10 mg via ORAL

## 2015-01-25 MED ORDER — SODIUM CHLORIDE 0.9 % IV SOLN
Freq: Once | INTRAVENOUS | Status: AC
  Administered 2015-01-25: 12:00:00 via INTRAVENOUS

## 2015-01-25 MED ORDER — PROCHLORPERAZINE MALEATE 10 MG PO TABS
ORAL_TABLET | ORAL | Status: AC
  Filled 2015-01-25: qty 1

## 2015-01-25 MED ORDER — SODIUM CHLORIDE 0.9 % IJ SOLN
10.0000 mL | INTRAMUSCULAR | Status: DC | PRN
  Administered 2015-01-25: 10 mL
  Filled 2015-01-25: qty 10

## 2015-01-25 MED ORDER — HEPARIN SOD (PORK) LOCK FLUSH 100 UNIT/ML IV SOLN
500.0000 [IU] | Freq: Once | INTRAVENOUS | Status: AC | PRN
  Administered 2015-01-25: 500 [IU]
  Filled 2015-01-25: qty 5

## 2015-01-25 NOTE — Progress Notes (Signed)
Pt hasn't had labs since 01/02/2015.  Dr. Alvy Bimler aware.  Ok to treat based on these labs per Dr. Alvy Bimler

## 2015-01-25 NOTE — Patient Instructions (Signed)
[facility] Discharge Instructions for Patients Receiving Chemotherapy  Today you received the following chemotherapy agents Gemzar  To help prevent nausea and vomiting after your treatment, we encourage you to take your nausea medication as directed/prescribed   If you develop nausea and vomiting that is not controlled by your nausea medication, call the clinic.   BELOW ARE SYMPTOMS THAT SHOULD BE REPORTED IMMEDIATELY:  *FEVER GREATER THAN 100.5 F  *CHILLS WITH OR WITHOUT FEVER  NAUSEA AND VOMITING THAT IS NOT CONTROLLED WITH YOUR NAUSEA MEDICATION  *UNUSUAL SHORTNESS OF BREATH  *UNUSUAL BRUISING OR BLEEDING  TENDERNESS IN MOUTH AND THROAT WITH OR WITHOUT PRESENCE OF ULCERS  *URINARY PROBLEMS  *BOWEL PROBLEMS  UNUSUAL RASH Items with * indicate a potential emergency and should be followed up as soon as possible.  Feel free to call the clinic you have any questions or concerns. The clinic phone number is [phone].  Please show the CHEMO ALERT CARD at check-in to the Emergency Department and triage nurse.   

## 2015-01-26 ENCOUNTER — Ambulatory Visit (HOSPITAL_COMMUNITY): Payer: Medicare Other

## 2015-01-29 ENCOUNTER — Telehealth (HOSPITAL_COMMUNITY): Payer: Self-pay

## 2015-01-29 NOTE — Telephone Encounter (Signed)
01/29/15    Called to follow up w/patient to see if patient had scheduled appt. w/[facility] for perio/ gum treatment. Patient stated she would call for an appt. later this week. Patient unable to schedule appt. at this time.   LRI

## 2015-01-30 ENCOUNTER — Ambulatory Visit: Payer: Self-pay | Admitting: Hematology and Oncology

## 2015-01-30 ENCOUNTER — Other Ambulatory Visit: Payer: Self-pay

## 2015-01-30 ENCOUNTER — Ambulatory Visit: Payer: Self-pay

## 2015-02-06 ENCOUNTER — Ambulatory Visit (HOSPITAL_BASED_OUTPATIENT_CLINIC_OR_DEPARTMENT_OTHER): Payer: Medicare Other

## 2015-02-06 ENCOUNTER — Ambulatory Visit: Payer: Medicare Other

## 2015-02-06 ENCOUNTER — Telehealth: Payer: Self-pay | Admitting: Hematology and Oncology

## 2015-02-06 ENCOUNTER — Other Ambulatory Visit (HOSPITAL_BASED_OUTPATIENT_CLINIC_OR_DEPARTMENT_OTHER): Payer: Medicare Other

## 2015-02-06 ENCOUNTER — Encounter: Payer: Self-pay | Admitting: Hematology and Oncology

## 2015-02-06 ENCOUNTER — Ambulatory Visit (HOSPITAL_BASED_OUTPATIENT_CLINIC_OR_DEPARTMENT_OTHER): Payer: Medicare Other | Admitting: Hematology and Oncology

## 2015-02-06 VITALS — BP 162/52 | HR 53 | Temp 97.8°F | Resp 18 | Ht 62.0 in | Wt 133.9 lb

## 2015-02-06 DIAGNOSIS — C8300 Small cell B-cell lymphoma, unspecified site: Secondary | ICD-10-CM

## 2015-02-06 DIAGNOSIS — C8590 Non-Hodgkin lymphoma, unspecified, unspecified site: Secondary | ICD-10-CM

## 2015-02-06 DIAGNOSIS — K521 Toxic gastroenteritis and colitis: Secondary | ICD-10-CM

## 2015-02-06 DIAGNOSIS — D6959 Other secondary thrombocytopenia: Secondary | ICD-10-CM

## 2015-02-06 DIAGNOSIS — T451X5A Adverse effect of antineoplastic and immunosuppressive drugs, initial encounter: Secondary | ICD-10-CM

## 2015-02-06 DIAGNOSIS — C7951 Secondary malignant neoplasm of bone: Secondary | ICD-10-CM | POA: Diagnosis not present

## 2015-02-06 DIAGNOSIS — R197 Diarrhea, unspecified: Secondary | ICD-10-CM

## 2015-02-06 DIAGNOSIS — C50912 Malignant neoplasm of unspecified site of left female breast: Secondary | ICD-10-CM

## 2015-02-06 DIAGNOSIS — Z5111 Encounter for antineoplastic chemotherapy: Secondary | ICD-10-CM

## 2015-02-06 DIAGNOSIS — T50905A Adverse effect of unspecified drugs, medicaments and biological substances, initial encounter: Secondary | ICD-10-CM

## 2015-02-06 DIAGNOSIS — D72818 Other decreased white blood cell count: Secondary | ICD-10-CM

## 2015-02-06 DIAGNOSIS — Z95828 Presence of other vascular implants and grafts: Secondary | ICD-10-CM

## 2015-02-06 DIAGNOSIS — D701 Agranulocytosis secondary to cancer chemotherapy: Secondary | ICD-10-CM

## 2015-02-06 DIAGNOSIS — D630 Anemia in neoplastic disease: Secondary | ICD-10-CM

## 2015-02-06 HISTORY — DX: Diarrhea, unspecified: R19.7

## 2015-02-06 LAB — COMPREHENSIVE METABOLIC PANEL (CC13)
ALK PHOS: 77 U/L (ref 40–150)
ALT: 10 U/L (ref 0–55)
AST: 16 U/L (ref 5–34)
Albumin: 3.7 g/dL (ref 3.5–5.0)
Anion Gap: 12 mEq/L — ABNORMAL HIGH (ref 3–11)
BILIRUBIN TOTAL: 0.31 mg/dL (ref 0.20–1.20)
BUN: 9.1 mg/dL (ref 7.0–26.0)
CO2: 23 meq/L (ref 22–29)
Calcium: 9.5 mg/dL (ref 8.4–10.4)
Chloride: 109 mEq/L (ref 98–109)
Creatinine: 0.8 mg/dL (ref 0.6–1.1)
EGFR: 87 mL/min/{1.73_m2} — ABNORMAL LOW (ref >90)
Glucose: 96 mg/dl (ref 70–140)
Potassium: 3.3 mEq/L — ABNORMAL LOW (ref 3.5–5.1)
SODIUM: 144 meq/L (ref 136–145)
TOTAL PROTEIN: 6.4 g/dL (ref 6.4–8.3)

## 2015-02-06 LAB — CBC WITH DIFFERENTIAL/PLATELET
BASO%: 0.9 % (ref 0.0–2.0)
BASOS ABS: 0 10*3/uL (ref 0.0–0.1)
EOS%: 5.2 % (ref 0.0–7.0)
Eosinophils Absolute: 0.1 10*3/uL (ref 0.0–0.5)
HEMATOCRIT: 31.8 % — AB (ref 34.8–46.6)
HEMOGLOBIN: 10.1 g/dL — AB (ref 11.6–15.9)
LYMPH%: 28.3 % (ref 14.0–49.7)
MCH: 27.2 pg (ref 25.1–34.0)
MCHC: 31.8 g/dL (ref 31.5–36.0)
MCV: 85.7 fL (ref 79.5–101.0)
MONO#: 0.6 10*3/uL (ref 0.1–0.9)
MONO%: 23.6 % — AB (ref 0.0–14.0)
NEUT#: 1 10*3/uL — ABNORMAL LOW (ref 1.5–6.5)
NEUT%: 42 % (ref 38.4–76.8)
PLATELETS: 126 10*3/uL — AB (ref 145–400)
RBC: 3.71 10*6/uL (ref 3.70–5.45)
RDW: 14.4 % (ref 11.2–14.5)
WBC: 2.3 10*3/uL — AB (ref 3.9–10.3)
lymph#: 0.7 10*3/uL — ABNORMAL LOW (ref 0.9–3.3)

## 2015-02-06 MED ORDER — PROCHLORPERAZINE MALEATE 10 MG PO TABS
ORAL_TABLET | ORAL | Status: AC
  Filled 2015-02-06: qty 1

## 2015-02-06 MED ORDER — HEPARIN SOD (PORK) LOCK FLUSH 100 UNIT/ML IV SOLN
500.0000 [IU] | Freq: Once | INTRAVENOUS | Status: AC | PRN
  Administered 2015-02-06: 500 [IU]
  Filled 2015-02-06: qty 5

## 2015-02-06 MED ORDER — SODIUM CHLORIDE 0.9 % IV SOLN
800.0000 mg/m2 | Freq: Once | INTRAVENOUS | Status: AC
  Administered 2015-02-06: 1292 mg via INTRAVENOUS
  Filled 2015-02-06: qty 33.98

## 2015-02-06 MED ORDER — SODIUM CHLORIDE 0.9 % IJ SOLN
10.0000 mL | INTRAMUSCULAR | Status: DC | PRN
  Administered 2015-02-06: 10 mL
  Filled 2015-02-06: qty 10

## 2015-02-06 MED ORDER — LOPERAMIDE HCL 2 MG PO CAPS: 2.0000 mg | ORAL_CAPSULE | ORAL | Status: DC | PRN

## 2015-02-06 MED ORDER — SODIUM CHLORIDE 0.9 % IJ SOLN
10.0000 mL | INTRAMUSCULAR | Status: DC | PRN
  Administered 2015-02-06: 10 mL via INTRAVENOUS
  Filled 2015-02-06: qty 10

## 2015-02-06 MED ORDER — PROCHLORPERAZINE MALEATE 10 MG PO TABS
10.0000 mg | ORAL_TABLET | Freq: Once | ORAL | Status: AC
  Administered 2015-02-06: 10 mg via ORAL

## 2015-02-06 MED ORDER — SODIUM CHLORIDE 0.9 % IV SOLN
Freq: Once | INTRAVENOUS | Status: AC
  Administered 2015-02-06: 11:00:00 via INTRAVENOUS

## 2015-02-06 NOTE — Progress Notes (Signed)
Ok to treat today in spite of low WBC and ANC per Dr. Alvy Bimler.

## 2015-02-06 NOTE — Assessment & Plan Note (Signed)
This is likely due to recent treatment. The patient denies recent history of bleeding such as epistaxis, hematuria or hematochezia. She is asymptomatic from the low platelet count. I will observe for now.I plan to reduce the dose of Ibrutinib as above

## 2015-02-06 NOTE — Progress Notes (Signed)
Per Dr. Alvy Bimler, okay to tx with today's labs.

## 2015-02-06 NOTE — Assessment & Plan Note (Signed)
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 She had recent dental extraction. I will proceed with treatment with Xgeva next month.

## 2015-02-06 NOTE — Patient Instructions (Signed)
[facility] Discharge Instructions for Patients Receiving Chemotherapy  Today you received the following chemotherapy agents: Gemzar  To help prevent nausea and vomiting after your treatment, we encourage you to take your nausea medication as prescribed by your physician.    If you develop nausea and vomiting that is not controlled by your nausea medication, call the clinic.   BELOW ARE SYMPTOMS THAT SHOULD BE REPORTED IMMEDIATELY:  *FEVER GREATER THAN 100.5 F  *CHILLS WITH OR WITHOUT FEVER  NAUSEA AND VOMITING THAT IS NOT CONTROLLED WITH YOUR NAUSEA MEDICATION  *UNUSUAL SHORTNESS OF BREATH  *UNUSUAL BRUISING OR BLEEDING  TENDERNESS IN MOUTH AND THROAT WITH OR WITHOUT PRESENCE OF ULCERS  *URINARY PROBLEMS  *BOWEL PROBLEMS  UNUSUAL RASH Items with * indicate a potential emergency and should be followed up as soon as possible.  Feel free to call the clinic you have any questions or concerns. The clinic phone number is [phone].  Please show the CHEMO ALERT CARD at check-in to the Emergency Department and triage nurse.   

## 2015-02-06 NOTE — Assessment & Plan Note (Signed)
This is likely related to side effects of treatment.I plan to reduce the dose of Ibrutinib as above  I also recommend round-the-clock Imodium for several days.

## 2015-02-06 NOTE — Assessment & Plan Note (Signed)
She was started with Gemzar and tolerated treatment well. I will continue the same dose. Even though she has very mild pancytopenia, I suspect the cause of the pancytopenia is more related to Ibrutinib full. If her repeat CBC in 2 weeks is still low, I might consider reducing the dose of Gemzar.

## 2015-02-06 NOTE — Telephone Encounter (Signed)
gave and printed aptp sched and avs for pt for April adn May....sed added tx.

## 2015-02-06 NOTE — Assessment & Plan Note (Signed)
I plan to reduce the dose of Ibrutinib as above  This is likely due to recent treatment. The patient denies recent history of bleeding such as epistaxis, hematuria or hematochezia. She is asymptomatic from the low platelet count. I will observe for now.

## 2015-02-06 NOTE — Progress Notes (Signed)
[name] OFF[facility] PROGRESS NOTE  Patient Care Team: No Pcp Per Patient as PCP - General (General Practice) Helen Lark, MD as Consulting Physician (Hematology and Oncology)  SUMMARY OF ONCOLOGIC HISTORY:   Cancer of left breast   08/03/2008 Initial Diagnosis Breast CA   11/06/2014 Imaging Repeat CT scan of the chest, abdomen and pelvis show regression in the size of liver metastasis and pulmonary metastasis with mild progression of lymphadenopathy   11/06/2014 Tumor Marker [id number] is elevated at 275   01/18/2015 Imaging MRI head is negative for metastatic disease   01/18/2015 Imaging CT scan of the chest, abdomen and pelvis show significant disease progression throughout.    INTERVAL HISTORY: Please see below for problem oriented charting. She is seen prior to treatment today. She complained of 3-4 times diarrhea from treatment. The lymphadenopathy is reducing in size. She has good energy level but complaining of mild fatigue.  REVIEW OF SYSTEMS:   Constitutional: Denies fevers, chills or abnormal weight loss Eyes: Denies blurriness of vision Ears, nose, mouth, throat, and face: Denies mucositis or sore throat Respiratory: Denies cough, dyspnea or wheezes Cardiovascular: Denies palpitation, chest discomfort or lower extremity swelling Skin: Denies abnormal skin rashes Lymphatics: Denies new lymphadenopathy or easy bruising Neurological:Denies numbness, tingling or new weaknesses Behavioral/Psych: Mood is stable, no new changes  All other systems were reviewed with the patient and are negative.  I have reviewed the past medical history, past surgical history, social history and family history with the patient and they are unchanged from previous note.  ALLERGIES:  is allergic to codeine; penicillins; sulfonamide derivatives; tape; and tramadol hcl.  MEDICATIONS:  Current Outpatient Prescriptions  Medication Sig Dispense Refill  . HYDROcodone-acetaminophen (NORCO)  5-325 MG per tablet Take 1 tablet by mouth every 6 (six) hours as needed for moderate pain. 60 tablet 0  . ibrutinib (IMBRUVICA) 140 MG capsul Take 3 capsules (420 mg total) by mouth daily. 90 capsule 0  . lidocaine-prilocaine (EMLA) cream Apply 1 application topically as needed. 30 g 6  . loperamide (IMODIUM) 2 MG capsule Take 1 capsule (2 mg total) by mouth as needed for diarrhea or loose stools. 60 capsule 5   No current facility-administered medications for this visit.   Facility-Administered Medications Ordered in Other Visits  Medication Dose Route Frequency Provider Last Rate Last Dose  . sodium chloride 0.9 % injection 10 mL  10 mL Intravenous PRN Helen Lark, MD   10 mL at 02/06/15 1030    PHYSICAL EXAMINATION: ECOG PERFORMANCE STATUS: 1 - Symptomatic but completely ambulatory  Filed Vitals:   02/06/15 1042  BP: 162/52  Pulse: 53  Temp: 97.8 F (36.6 C)  Resp: 18   Filed Weights   02/06/15 1042  Weight: 133 lb 14.4 oz (60.737 kg)    GENERAL:alert, no distress and comfortable SKIN: skin color, texture, turgor are normal, no rashes or significant lesions EYES: normal, Conjunctiva are pink and non-injected, sclera clear OROPHARYNX:no exudate, no erythema and lips, buccal mucosa, and tongue normal  NECK: supple, thyroid normal size, non-tender, without nodularity LYMPH:  Previously palpable lymphadenopathy has regressed in size.  LUNGS: clear to auscultation and percussion with normal breathing effort HEART: regular rate & rhythm and no murmurs and no lower extremity edema ABDOMEN:abdomen soft, non-tender and normal bowel sounds Musculoskeletal:no cyanosis of digits and no clubbing  NEURO: alert & oriented x 3 with fluent speech, no focal motor/sensory deficits  LABORATORY DATA:  I have reviewed the data as listed  Component Value Date/Time   NA 139 01/13/2015 1730   NA 142 01/02/2015 0917   NA 142 06/01/2012 1008   K 4.0 01/13/2015 1730   K 3.7 01/02/2015 0917    K 3.8 06/01/2012 1008   CL 103 01/13/2015 1730   CL 107 04/22/2013 0922   CL 103 06/01/2012 1008   CO2 25 01/02/2015 0917   CO2 25 06/13/2014 0819   CO2 25 06/01/2012 1008   GLUCOSE 93 01/13/2015 1730   GLUCOSE 98 01/02/2015 0917   GLUCOSE 89 04/22/2013 0922   GLUCOSE 108 06/01/2012 1008   BUN 12 01/13/2015 1730   BUN 10.2 01/02/2015 0917   BUN 9 06/01/2012 1008   CREATININE 0.80 01/13/2015 1730   CREATININE 0.7 01/02/2015 0917   CREATININE 0.9 06/01/2012 1008   CALCIUM 10.1 01/02/2015 0917   CALCIUM 10.8* 06/13/2014 0819   CALCIUM 9.8 06/01/2012 1008   PROT 6.7 01/02/2015 0917   PROT 7.5 06/13/2014 0819   PROT 8.1 08/14/2010 1251   ALBUMIN 3.6 01/02/2015 0917   ALBUMIN 3.9 06/13/2014 0819   AST 21 01/02/2015 0917   AST 23 06/13/2014 0819   AST 19 08/14/2010 1251   ALT 17 01/02/2015 0917   ALT 13 06/13/2014 0819   ALT 9* 08/14/2010 1251   ALKPHOS 96 01/02/2015 0917   ALKPHOS 89 06/13/2014 0819   ALKPHOS 78 08/14/2010 1251   BILITOT 0.27 01/02/2015 0917   BILITOT 0.2* 06/13/2014 0819   BILITOT 0.40 08/14/2010 1251   GFRNONAA 87* 07/06/2012 1122   GFRAA >90 07/06/2012 1122    No results found for: SPEP, UPEP  Lab Results  Component Value Date   WBC 2.3* 02/06/2015   NEUTROABS 1.0* 02/06/2015   HGB 10.1* 02/06/2015   HCT 31.8* 02/06/2015   MCV 85.7 02/06/2015   PLT 126* 02/06/2015      Chemistry      Component Value Date/Time   NA 139 01/13/2015 1730   NA 142 01/02/2015 0917   NA 142 06/01/2012 1008   K 4.0 01/13/2015 1730   K 3.7 01/02/2015 0917   K 3.8 06/01/2012 1008   CL 103 01/13/2015 1730   CL 107 04/22/2013 0922   CL 103 06/01/2012 1008   CO2 25 01/02/2015 0917   CO2 25 06/13/2014 0819   CO2 25 06/01/2012 1008   BUN 12 01/13/2015 1730   BUN 10.2 01/02/2015 0917   BUN 9 06/01/2012 1008   CREATININE 0.80 01/13/2015 1730   CREATININE 0.7 01/02/2015 0917   CREATININE 0.9 06/01/2012 1008      Component Value Date/Time   CALCIUM 10.1  01/02/2015 0917   CALCIUM 10.8* 06/13/2014 0819   CALCIUM 9.8 06/01/2012 1008   ALKPHOS 96 01/02/2015 0917   ALKPHOS 89 06/13/2014 0819   ALKPHOS 78 08/14/2010 1251   AST 21 01/02/2015 0917   AST 23 06/13/2014 0819   AST 19 08/14/2010 1251   ALT 17 01/02/2015 0917   ALT 13 06/13/2014 0819   ALT 9* 08/14/2010 1251   BILITOT 0.27 01/02/2015 0917   BILITOT 0.2* 06/13/2014 0819   BILITOT 0.40 08/14/2010 1251     ASSESSMENT & PLAN:  Cancer of left breast She was started with Gemzar and tolerated treatment well. I will continue the same dose. Even though she has very mild pancytopenia, I suspect the cause of the pancytopenia is more related to Ibrutinib full. If her repeat CBC in 2 weeks is still low, I might consider reducing the dose of Gemzar.  Lymphoma, small lymphocytic The size of the lymphadenopathy is improving. She is responding to Ibrutinib but this is causing significant diarrhea. I recommend she start taking Imodium regularly and I will plan to reduce the dose of to 2 tablets daily.   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 She had recent dental extraction. I will proceed with treatment with Xgeva next month.    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 The patient denies recent history of bleeding such as epistaxis, hematuria or hematochezia. She is asymptomatic from the anemia. I will observe for now. I plan to reduce the dose of Ibrutinib as above    Leukopenia due to antineoplastic chemotherapy I plan to reduce the dose of Ibrutinib as above  This is likely due to recent treatment. The patient denies recent history of bleeding such as epistaxis, hematuria or hematochezia. She is asymptomatic from the low platelet count. I will observe for now.    Thrombocytopenia due to drugs This is likely due to recent treatment. The patient denies recent history of bleeding such as epistaxis, hematuria or  hematochezia. She is asymptomatic from the low platelet count. I will observe for now.I plan to reduce the dose of Ibrutinib as above      Diarrhea due to drug This is likely related to side effects of treatment.I plan to reduce the dose of Ibrutinib as above  I also recommend round-the-clock Imodium for several days.    Orders Placed This Encounter  Procedures  . Cancer antigen 27.29    Standing Status: Future     Number of Occurrences:      Standing Expiration Date: 03/12/2016   All questions were answered. The patient knows to call the clinic with any problems, questions or concerns. No barriers to learning was detected. I spent 30 minutes counseling the patient face to face. The total time spent in the appointment was 40 minutes and more than 50% was on counseling and review of test results     [facility], [place], MD 02/06/2015 11:04 AM

## 2015-02-06 NOTE — Patient Instructions (Signed)

## 2015-02-06 NOTE — Assessment & Plan Note (Signed)
This is likely due to recent treatment. The patient denies recent history of bleeding such as epistaxis, hematuria or hematochezia. She is asymptomatic from the anemia. I will observe for now. I plan to reduce the dose of Ibrutinib as above

## 2015-02-06 NOTE — Assessment & Plan Note (Signed)
The size of the lymphadenopathy is improving. She is responding to Ibrutinib but this is causing significant diarrhea. I recommend she start taking Imodium regularly and I will plan to reduce the dose of to 2 tablets daily.

## 2015-02-07 ENCOUNTER — Telehealth (HOSPITAL_COMMUNITY): Payer: Self-pay

## 2015-02-07 NOTE — Telephone Encounter (Signed)
02/07/2015                Called and spoke w/Kim from [facility] regarding scheduling Dental appt. for patient. Maudie Mercury will call patient @ [phone] and schedule appt. for dental treatment.  LRI

## 2015-02-08 ENCOUNTER — Telehealth: Payer: Self-pay | Admitting: *Deleted

## 2015-02-08 NOTE — Telephone Encounter (Signed)
-----   Message from [name], MD sent at 02/08/2015  9:58 AM EDT ----- Regarding: diarrhea Can you check if diarrhea is better?

## 2015-02-08 NOTE — Telephone Encounter (Signed)
yay!

## 2015-02-08 NOTE — Telephone Encounter (Signed)
Pt states she has not had any more diarrhea stools since she decreased Ibrutinib to two pills per day.  She did not get the loperamide rx filled,  States has not needed it.  Reports "normal" BMs now.

## 2015-02-20 ENCOUNTER — Ambulatory Visit (HOSPITAL_BASED_OUTPATIENT_CLINIC_OR_DEPARTMENT_OTHER): Payer: Medicare Other

## 2015-02-20 ENCOUNTER — Other Ambulatory Visit (HOSPITAL_BASED_OUTPATIENT_CLINIC_OR_DEPARTMENT_OTHER): Payer: Medicare Other

## 2015-02-20 ENCOUNTER — Ambulatory Visit: Payer: Medicare Other

## 2015-02-20 ENCOUNTER — Other Ambulatory Visit: Payer: Self-pay

## 2015-02-20 VITALS — BP 146/60 | HR 54 | Temp 97.8°F | Resp 18

## 2015-02-20 DIAGNOSIS — C50912 Malignant neoplasm of unspecified site of left female breast: Secondary | ICD-10-CM

## 2015-02-20 DIAGNOSIS — Z95828 Presence of other vascular implants and grafts: Secondary | ICD-10-CM

## 2015-02-20 DIAGNOSIS — C8590 Non-Hodgkin lymphoma, unspecified, unspecified site: Secondary | ICD-10-CM

## 2015-02-20 DIAGNOSIS — C7951 Secondary malignant neoplasm of bone: Secondary | ICD-10-CM | POA: Diagnosis not present

## 2015-02-20 DIAGNOSIS — C8300 Small cell B-cell lymphoma, unspecified site: Secondary | ICD-10-CM

## 2015-02-20 DIAGNOSIS — Z5111 Encounter for antineoplastic chemotherapy: Secondary | ICD-10-CM | POA: Diagnosis not present

## 2015-02-20 LAB — COMPREHENSIVE METABOLIC PANEL (CC13)
ALBUMIN: 3.5 g/dL (ref 3.5–5.0)
ALK PHOS: 67 U/L (ref 40–150)
ALT: 9 U/L (ref 0–55)
ANION GAP: 12 meq/L — AB (ref 3–11)
AST: 16 U/L (ref 5–34)
BUN: 8.1 mg/dL (ref 7.0–26.0)
CALCIUM: 9.2 mg/dL (ref 8.4–10.4)
CO2: 22 meq/L (ref 22–29)
Chloride: 106 mEq/L (ref 98–109)
Creatinine: 0.7 mg/dL (ref 0.6–1.1)
EGFR: >90 mL/min/{1.73_m2} (ref >90)
Glucose: 89 mg/dl (ref 70–140)
POTASSIUM: 3.4 meq/L — AB (ref 3.5–5.1)
Sodium: 139 mEq/L (ref 136–145)
TOTAL PROTEIN: 6.4 g/dL (ref 6.4–8.3)
Total Bilirubin: 0.34 mg/dL (ref 0.20–1.20)

## 2015-02-20 LAB — CBC WITH DIFFERENTIAL/PLATELET
BASO%: 0.6 % (ref 0.0–2.0)
Basophils Absolute: 0 10*3/uL (ref 0.0–0.1)
EOS%: 2.6 % (ref 0.0–7.0)
Eosinophils Absolute: 0.1 10*3/uL (ref 0.0–0.5)
HCT: 30.4 % — ABNORMAL LOW (ref 34.8–46.6)
HGB: 9.8 g/dL — ABNORMAL LOW (ref 11.6–15.9)
LYMPH%: 18.8 % (ref 14.0–49.7)
MCH: 27.8 pg (ref 25.1–34.0)
MCHC: 32.2 g/dL (ref 31.5–36.0)
MCV: 86.1 fL (ref 79.5–101.0)
MONO#: 1 10*3/uL — ABNORMAL HIGH (ref 0.1–0.9)
MONO%: 28.7 % — ABNORMAL HIGH (ref 0.0–14.0)
NEUT#: 1.7 10*3/uL (ref 1.5–6.5)
NEUT%: 49.3 % (ref 38.4–76.8)
Platelets: 122 10*3/uL — ABNORMAL LOW (ref 145–400)
RBC: 3.53 10*6/uL — ABNORMAL LOW (ref 3.70–5.45)
RDW: 15.7 % — ABNORMAL HIGH (ref 11.2–14.5)
WBC: 3.5 10*3/uL — ABNORMAL LOW (ref 3.9–10.3)
lymph#: 0.7 10*3/uL — ABNORMAL LOW (ref 0.9–3.3)

## 2015-02-20 MED ORDER — SODIUM CHLORIDE 0.9 % IJ SOLN
10.0000 mL | INTRAMUSCULAR | Status: DC | PRN
  Administered 2015-02-20: 10 mL
  Filled 2015-02-20: qty 10

## 2015-02-20 MED ORDER — SODIUM CHLORIDE 0.9 % IV SOLN
Freq: Once | INTRAVENOUS | Status: AC
  Administered 2015-02-20: 13:00:00 via INTRAVENOUS

## 2015-02-20 MED ORDER — PROCHLORPERAZINE MALEATE 10 MG PO TABS
10.0000 mg | ORAL_TABLET | Freq: Once | ORAL | Status: AC
  Administered 2015-02-20: 10 mg via ORAL

## 2015-02-20 MED ORDER — HEPARIN SOD (PORK) LOCK FLUSH 100 UNIT/ML IV SOLN
500.0000 [IU] | Freq: Once | INTRAVENOUS | Status: AC | PRN
  Administered 2015-02-20: 500 [IU]
  Filled 2015-02-20: qty 5

## 2015-02-20 MED ORDER — SODIUM CHLORIDE 0.9 % IV SOLN
800.0000 mg/m2 | Freq: Once | INTRAVENOUS | Status: AC
  Administered 2015-02-20: 1292 mg via INTRAVENOUS
  Filled 2015-02-20: qty 33.98

## 2015-02-20 MED ORDER — PROCHLORPERAZINE MALEATE 10 MG PO TABS
ORAL_TABLET | ORAL | Status: AC
  Filled 2015-02-20: qty 1

## 2015-02-20 MED ORDER — SODIUM CHLORIDE 0.9 % IJ SOLN
10.0000 mL | INTRAMUSCULAR | Status: DC | PRN
  Administered 2015-02-20: 10 mL via INTRAVENOUS
  Filled 2015-02-20: qty 10

## 2015-02-20 NOTE — Patient Instructions (Signed)
[facility] Discharge Instructions for Patients Receiving Chemotherapy  Today you received the following chemotherapy agents Gemzar  To help prevent nausea and vomiting after your treatment, we encourage you to take your nausea medication as directed.    If you develop nausea and vomiting that is not controlled by your nausea medication, call the clinic.   BELOW ARE SYMPTOMS THAT SHOULD BE REPORTED IMMEDIATELY:  *FEVER GREATER THAN 100.5 F  *CHILLS WITH OR WITHOUT FEVER  NAUSEA AND VOMITING THAT IS NOT CONTROLLED WITH YOUR NAUSEA MEDICATION  *UNUSUAL SHORTNESS OF BREATH  *UNUSUAL BRUISING OR BLEEDING  TENDERNESS IN MOUTH AND THROAT WITH OR WITHOUT PRESENCE OF ULCERS  *URINARY PROBLEMS  *BOWEL PROBLEMS  UNUSUAL RASH Items with * indicate a potential emergency and should be followed up as soon as possible.  Feel free to call the clinic you have any questions or concerns. The clinic phone number is [phone].  Please show the CHEMO ALERT CARD at check-in to the Emergency Department and triage nurse.   

## 2015-02-20 NOTE — Patient Instructions (Signed)

## 2015-02-21 LAB — CANCER ANTIGEN 27.29: CA 27.29: 246 U/mL — AB (ref 0–39)

## 2015-03-06 ENCOUNTER — Ambulatory Visit (HOSPITAL_BASED_OUTPATIENT_CLINIC_OR_DEPARTMENT_OTHER): Payer: Medicare Other | Admitting: Hematology and Oncology

## 2015-03-06 ENCOUNTER — Encounter: Payer: Self-pay | Admitting: Hematology and Oncology

## 2015-03-06 ENCOUNTER — Ambulatory Visit: Payer: Medicare Other

## 2015-03-06 ENCOUNTER — Telehealth: Payer: Self-pay | Admitting: Hematology and Oncology

## 2015-03-06 ENCOUNTER — Other Ambulatory Visit (HOSPITAL_BASED_OUTPATIENT_CLINIC_OR_DEPARTMENT_OTHER): Payer: Medicare Other

## 2015-03-06 ENCOUNTER — Ambulatory Visit (HOSPITAL_BASED_OUTPATIENT_CLINIC_OR_DEPARTMENT_OTHER): Payer: Medicare Other

## 2015-03-06 VITALS — BP 140/47 | HR 53 | Temp 97.9°F | Resp 18 | Ht 62.0 in | Wt 135.1 lb

## 2015-03-06 DIAGNOSIS — D630 Anemia in neoplastic disease: Secondary | ICD-10-CM

## 2015-03-06 DIAGNOSIS — Z5111 Encounter for antineoplastic chemotherapy: Secondary | ICD-10-CM

## 2015-03-06 DIAGNOSIS — C7951 Secondary malignant neoplasm of bone: Secondary | ICD-10-CM

## 2015-03-06 DIAGNOSIS — C50912 Malignant neoplasm of unspecified site of left female breast: Secondary | ICD-10-CM | POA: Diagnosis not present

## 2015-03-06 DIAGNOSIS — Z95828 Presence of other vascular implants and grafts: Secondary | ICD-10-CM

## 2015-03-06 DIAGNOSIS — C8590 Non-Hodgkin lymphoma, unspecified, unspecified site: Secondary | ICD-10-CM

## 2015-03-06 DIAGNOSIS — C8300 Small cell B-cell lymphoma, unspecified site: Secondary | ICD-10-CM

## 2015-03-06 LAB — COMPREHENSIVE METABOLIC PANEL (CC13)
ALT: 8 U/L (ref 0–55)
ANION GAP: 9 meq/L (ref 3–11)
AST: 14 U/L (ref 5–34)
Albumin: 3.4 g/dL — ABNORMAL LOW (ref 3.5–5.0)
Alkaline Phosphatase: 71 U/L (ref 40–150)
BUN: 7.1 mg/dL (ref 7.0–26.0)
CALCIUM: 9.3 mg/dL (ref 8.4–10.4)
CO2: 24 meq/L (ref 22–29)
CREATININE: 0.7 mg/dL (ref 0.6–1.1)
Chloride: 106 mEq/L (ref 98–109)
EGFR: >90 mL/min/{1.73_m2} (ref >90)
GLUCOSE: 94 mg/dL (ref 70–140)
POTASSIUM: 3.5 meq/L (ref 3.5–5.1)
Sodium: 139 mEq/L (ref 136–145)
TOTAL PROTEIN: 6.2 g/dL — AB (ref 6.4–8.3)
Total Bilirubin: 0.25 mg/dL (ref 0.20–1.20)

## 2015-03-06 LAB — CBC WITH DIFFERENTIAL/PLATELET
BASO%: 1.6 % (ref 0.0–2.0)
BASOS ABS: 0.1 10*3/uL (ref 0.0–0.1)
EOS ABS: 0.1 10*3/uL (ref 0.0–0.5)
EOS%: 3.3 % (ref 0.0–7.0)
HCT: 29.4 % — ABNORMAL LOW (ref 34.8–46.6)
HGB: 9.3 g/dL — ABNORMAL LOW (ref 11.6–15.9)
LYMPH%: 11.5 % — AB (ref 14.0–49.7)
MCH: 27.4 pg (ref 25.1–34.0)
MCHC: 31.6 g/dL (ref 31.5–36.0)
MCV: 86.5 fL (ref 79.5–101.0)
MONO#: 0.8 10*3/uL (ref 0.1–0.9)
MONO%: 26.2 % — ABNORMAL HIGH (ref 0.0–14.0)
NEUT%: 57.4 % (ref 38.4–76.8)
NEUTROS ABS: 1.8 10*3/uL (ref 1.5–6.5)
Platelets: 144 10*3/uL — ABNORMAL LOW (ref 145–400)
RBC: 3.4 10*6/uL — ABNORMAL LOW (ref 3.70–5.45)
RDW: 16.2 % — ABNORMAL HIGH (ref 11.2–14.5)
WBC: 3.1 10*3/uL — AB (ref 3.9–10.3)
lymph#: 0.4 10*3/uL — ABNORMAL LOW (ref 0.9–3.3)

## 2015-03-06 MED ORDER — SODIUM CHLORIDE 0.9 % IJ SOLN
10.0000 mL | INTRAMUSCULAR | Status: DC | PRN
  Administered 2015-03-06: 10 mL via INTRAVENOUS
  Filled 2015-03-06: qty 10

## 2015-03-06 MED ORDER — HEPARIN SOD (PORK) LOCK FLUSH 100 UNIT/ML IV SOLN
500.0000 [IU] | Freq: Once | INTRAVENOUS | Status: AC | PRN
  Administered 2015-03-06: 500 [IU]
  Filled 2015-03-06: qty 5

## 2015-03-06 MED ORDER — PROCHLORPERAZINE MALEATE 10 MG PO TABS
10.0000 mg | ORAL_TABLET | Freq: Once | ORAL | Status: AC
  Administered 2015-03-06: 10 mg via ORAL

## 2015-03-06 MED ORDER — SODIUM CHLORIDE 0.9 % IJ SOLN
10.0000 mL | INTRAMUSCULAR | Status: DC | PRN
  Administered 2015-03-06: 10 mL
  Filled 2015-03-06: qty 10

## 2015-03-06 MED ORDER — PROCHLORPERAZINE MALEATE 10 MG PO TABS
ORAL_TABLET | ORAL | Status: AC
  Filled 2015-03-06: qty 1

## 2015-03-06 MED ORDER — SODIUM CHLORIDE 0.9 % IV SOLN
800.0000 mg/m2 | Freq: Once | INTRAVENOUS | Status: AC
  Administered 2015-03-06: 1292 mg via INTRAVENOUS
  Filled 2015-03-06: qty 33.98

## 2015-03-06 MED ORDER — SODIUM CHLORIDE 0.9 % IV SOLN
Freq: Once | INTRAVENOUS | Status: AC
  Administered 2015-03-06: 11:00:00 via INTRAVENOUS

## 2015-03-06 NOTE — Patient Instructions (Signed)
[facility] Discharge Instructions for Patients Receiving Chemotherapy  Today you received the following chemotherapy agents Gemzar  To help prevent nausea and vomiting after your treatment, we encourage you to take your nausea medication as directed.    If you develop nausea and vomiting that is not controlled by your nausea medication, call the clinic.   BELOW ARE SYMPTOMS THAT SHOULD BE REPORTED IMMEDIATELY:  *FEVER GREATER THAN 100.5 F  *CHILLS WITH OR WITHOUT FEVER  NAUSEA AND VOMITING THAT IS NOT CONTROLLED WITH YOUR NAUSEA MEDICATION  *UNUSUAL SHORTNESS OF BREATH  *UNUSUAL BRUISING OR BLEEDING  TENDERNESS IN MOUTH AND THROAT WITH OR WITHOUT PRESENCE OF ULCERS  *URINARY PROBLEMS  *BOWEL PROBLEMS  UNUSUAL RASH Items with * indicate a potential emergency and should be followed up as soon as possible.  Feel free to call the clinic you have any questions or concerns. The clinic phone number is [phone].  Please show the CHEMO ALERT CARD at check-in to the Emergency Department and triage nurse.   

## 2015-03-06 NOTE — Patient Instructions (Signed)

## 2015-03-06 NOTE — Telephone Encounter (Signed)
per pof to sch pt appt-gave pt contrast-gave pt copy of sch

## 2015-03-07 NOTE — Assessment & Plan Note (Signed)
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 She had recent dental extraction. I will proceed with treatment with Xgeva next month.

## 2015-03-07 NOTE — Assessment & Plan Note (Signed)
She was started with Gemzar and tolerated treatment well. I will continue the same dose. Even though she has very mild pancytopenia, I suspect the cause of the pancytopenia is more related to Ibrutinib. If her repeat CBC in 2 weeks is still low, I might consider reducing the dose of Gemzar.

## 2015-03-07 NOTE — Assessment & Plan Note (Signed)
The size of the lymphadenopathy is improving. She is responding to Ibrutinib . She cannot tolerate 3 tablets daily but since I reduced the dose, she is doing well on 2 tablets daily.. Since she has mixed disease on CT scan, I plan to repeat it before see her back next visit.

## 2015-03-07 NOTE — Progress Notes (Signed)
[place] OFFICE PROGRESS NOTE  Patient Care Team: No Pcp Per Patient as PCP - General (General Practice) [name], MD as Consulting Physician (Hematology and Oncology)  SUMMARY OF ONCOLOGIC HISTORY:   Cancer of left breast   08/03/2008 Initial Diagnosis Breast CA   11/06/2014 Imaging Repeat CT scan of the chest, abdomen and pelvis show regression in the size of liver metastasis and pulmonary metastasis with mild progression of lymphadenopathy   11/06/2014 Tumor Marker [id number] is elevated at 275   01/18/2015 Imaging MRI head is negative for metastatic disease   01/18/2015 Imaging CT scan of the chest, abdomen and pelvis show significant disease progression throughout.   01/25/2015 -  Chemotherapy She started Gemzar every other week and Ibrutinib     INTERVAL HISTORY: Please see below for problem oriented charting. She returns for further follow-up prior to treatment. The lymphadenopathy are no longer palpable. She denies further diarrhea. Denies new bone pain. No chest pain or shortness of breath. She complained of some fatigue.  REVIEW OF SYSTEMS:   Constitutional: Denies fevers, chills or abnormal weight loss Eyes: Denies blurriness of vision Ears, nose, mouth, throat, and face: Denies mucositis or sore throat Respiratory: Denies cough, dyspnea or wheezes Cardiovascular: Denies palpitation, chest discomfort or lower extremity swelling Gastrointestinal:  Denies nausea, heartburn or change in bowel habits Skin: Denies abnormal skin rashes Lymphatics: Denies new lymphadenopathy or easy bruising Neurological:Denies numbness, tingling or new weaknesses Behavioral/Psych: Mood is stable, no new changes  All other systems were reviewed with the patient and are negative.  I have reviewed the past medical history, past surgical history, social history and family history with the patient and they are unchanged from previous note.  ALLERGIES:  is allergic to codeine;  penicillins; sulfonamide derivatives; tape; and tramadol hcl.  MEDICATIONS:  Current Outpatient Prescriptions  Medication Sig Dispense Refill  . HYDROcodone-acetaminophen (NORCO) 5-325 MG per tablet Take 1 tablet by mouth every 6 (six) hours as needed for moderate pain. 60 tablet 0  . ibrutinib (IMBRUVICA) 140 MG capsul Take 3 capsules (420 mg total) by mouth daily. (Patient taking differently: Take 280 mg by mouth daily. ) 90 capsule 0  . lidocaine-prilocaine (EMLA) cream Apply 1 application topically as needed. 30 g 6   No current facility-administered medications for this visit.    PHYSICAL EXAMINATION: ECOG PERFORMANCE STATUS: 1 - Symptomatic but completely ambulatory  Filed Vitals:   03/06/15 1032  BP: 140/47  Pulse: 53  Temp: 97.9 F (36.6 C)  Resp: 18   Filed Weights   03/06/15 1032  Weight: 135 lb 1.6 oz (61.281 kg)    GENERAL:alert, no distress and comfortable SKIN: skin color, texture, turgor are normal, no rashes or significant lesions EYES: normal, Conjunctiva are pink and non-injected, sclera clear OROPHARYNX:no exudate, no erythema and lips, buccal mucosa, and tongue normal  NECK: supple, thyroid normal size, non-tender, without nodularity LYMPH:  no palpable lymphadenopathy in the cervical, axillary or inguinal LUNGS: clear to auscultation and percussion with normal breathing effort HEART: regular rate & rhythm and no murmurs and no lower extremity edema ABDOMEN:abdomen soft, non-tender and normal bowel sounds Musculoskeletal:no cyanosis of digits and no clubbing  NEURO: alert & oriented x 3 with fluent speech, no focal motor/sensory deficits  LABORATORY DATA:  I have reviewed the data as listed    Component Value Date/Time   NA 139 03/06/2015 0959   NA 139 01/13/2015 1730   NA 142 06/01/2012 1008   K 3.5 03/06/2015  0959   K 4.0 01/13/2015 1730   K 3.8 06/01/2012 1008   CL 103 01/13/2015 1730   CL 107 04/22/2013 0922   CL 103 06/01/2012 1008   CO2  24 03/06/2015 0959   CO2 25 06/13/2014 0819   CO2 25 06/01/2012 1008   GLUCOSE 94 03/06/2015 0959   GLUCOSE 93 01/13/2015 1730   GLUCOSE 89 04/22/2013 0922   GLUCOSE 108 06/01/2012 1008   BUN 7.1 03/06/2015 0959   BUN 12 01/13/2015 1730   BUN 9 06/01/2012 1008   CREATININE 0.7 03/06/2015 0959   CREATININE 0.80 01/13/2015 1730   CREATININE 0.9 06/01/2012 1008   CALCIUM 9.3 03/06/2015 0959   CALCIUM 10.8* 06/13/2014 0819   CALCIUM 9.8 06/01/2012 1008   PROT 6.2* 03/06/2015 0959   PROT 7.5 06/13/2014 0819   PROT 8.1 08/14/2010 1251   ALBUMIN 3.4* 03/06/2015 0959   ALBUMIN 3.9 06/13/2014 0819   AST 14 03/06/2015 0959   AST 23 06/13/2014 0819   AST 19 08/14/2010 1251   ALT 8 03/06/2015 0959   ALT 13 06/13/2014 0819   ALT 9* 08/14/2010 1251   ALKPHOS 71 03/06/2015 0959   ALKPHOS 89 06/13/2014 0819   ALKPHOS 78 08/14/2010 1251   BILITOT 0.25 03/06/2015 0959   BILITOT 0.2* 06/13/2014 0819   BILITOT 0.40 08/14/2010 1251   GFRNONAA 87* 07/06/2012 1122   GFRAA >90 07/06/2012 1122    No results found for: SPEP, UPEP  Lab Results  Component Value Date   WBC 3.1* 03/06/2015   NEUTROABS 1.8 03/06/2015   HGB 9.3* 03/06/2015   HCT 29.4* 03/06/2015   MCV 86.5 03/06/2015   PLT 144* 03/06/2015      Chemistry      Component Value Date/Time   NA 139 03/06/2015 0959   NA 139 01/13/2015 1730   NA 142 06/01/2012 1008   K 3.5 03/06/2015 0959   K 4.0 01/13/2015 1730   K 3.8 06/01/2012 1008   CL 103 01/13/2015 1730   CL 107 04/22/2013 0922   CL 103 06/01/2012 1008   CO2 24 03/06/2015 0959   CO2 25 06/13/2014 0819   CO2 25 06/01/2012 1008   BUN 7.1 03/06/2015 0959   BUN 12 01/13/2015 1730   BUN 9 06/01/2012 1008   CREATININE 0.7 03/06/2015 0959   CREATININE 0.80 01/13/2015 1730   CREATININE 0.9 06/01/2012 1008      Component Value Date/Time   CALCIUM 9.3 03/06/2015 0959   CALCIUM 10.8* 06/13/2014 0819   CALCIUM 9.8 06/01/2012 1008   ALKPHOS 71 03/06/2015 0959   ALKPHOS  89 06/13/2014 0819   ALKPHOS 78 08/14/2010 1251   AST 14 03/06/2015 0959   AST 23 06/13/2014 0819   AST 19 08/14/2010 1251   ALT 8 03/06/2015 0959   ALT 13 06/13/2014 0819   ALT 9* 08/14/2010 1251   BILITOT 0.25 03/06/2015 0959   BILITOT 0.2* 06/13/2014 0819   BILITOT 0.40 08/14/2010 1251      ASSESSMENT & PLAN:  Cancer of left breast She was started with Gemzar and tolerated treatment well. I will continue the same dose. Even though she has very mild pancytopenia, I suspect the cause of the pancytopenia is more related to Ibrutinib. If her repeat CBC in 2 weeks is still low, I might consider reducing the dose of Gemzar.   Lymphoma, small lymphocytic The size of the lymphadenopathy is improving. She is responding to Ibrutinib . She cannot tolerate 3 tablets daily but since  I reduced the dose, she is doing well on 2 tablets daily.. Since she has mixed disease on CT scan, I plan to repeat it before see her back next visit.    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 She had recent dental extraction. I will proceed with treatment with Xgeva next month.   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 The patient denies recent history of bleeding such as epistaxis, hematuria or hematochezia. She is asymptomatic from the anemia. I will observe for now.    Orders Placed This Encounter  Procedures  . CT Chest W Contrast    Standing Status: Future     Number of Occurrences:      Standing Expiration Date: 05/05/2016    Order Specific Question:  Reason for Exam (SYMPTOM  OR DIAGNOSIS REQUIRED)    Answer:  staging CLL and breast ca    Order Specific Question:  Preferred imaging location?    Answer:  [facility]  . CT Abdomen Pelvis W Contrast    Standing Status: Future     Number of Occurrences:      Standing Expiration Date: 06/05/2016    Order Specific Question:  Reason for Exam (SYMPTOM  OR DIAGNOSIS  REQUIRED)    Answer:  staging CLL and breast ca    Order Specific Question:  Preferred imaging location?    Answer:  [facility]   All questions were answered. The patient knows to call the clinic with any problems, questions or concerns. No barriers to learning was detected. I spent 25 minutes counseling the patient face to face. The total time spent in the appointment was 30 minutes and more than 50% was on counseling and review of test results     [facility], Quenton Recendez, MD 03/07/2015 1:40 PM

## 2015-03-07 NOTE — Assessment & Plan Note (Signed)
This is likely due to recent treatment. The patient denies recent history of bleeding such as epistaxis, hematuria or hematochezia. She is asymptomatic from the anemia. I will observe for now.   

## 2015-03-12 ENCOUNTER — Other Ambulatory Visit: Payer: Self-pay | Admitting: Hematology and Oncology

## 2015-03-12 ENCOUNTER — Telehealth: Payer: Self-pay

## 2015-03-12 DIAGNOSIS — C8300 Small cell B-cell lymphoma, unspecified site: Secondary | ICD-10-CM

## 2015-03-12 MED ORDER — IBRUTINIB 140 MG PO CAPS: 280.0000 mg | ORAL_CAPSULE | Freq: Every day | ORAL | Status: DC

## 2015-03-12 NOTE — Telephone Encounter (Signed)
S/w pt and clarified she is out of her imbruvica. She is taking 2/day. She gets it filled at [facility] outpatient pharmacy.

## 2015-03-12 NOTE — Telephone Encounter (Signed)
Pls make sure [facility] will refill her medications 2 tabs daily

## 2015-03-12 NOTE — Telephone Encounter (Signed)
Pt called stating she ran out of her chemo pill.

## 2015-03-15 ENCOUNTER — Telehealth: Payer: Self-pay | Admitting: *Deleted

## 2015-03-15 NOTE — Telephone Encounter (Signed)
CALLED MEDICATION REFILL REQUEST FOR IMBRUVICA TO [place], [place]. NOTIFIED PT. AND GAVE HER THE PHARMACY PHONE TO CALL ABOUT WHEN THE MEDICATION WILL BE READY.

## 2015-03-16 ENCOUNTER — Telehealth: Payer: Self-pay | Admitting: *Deleted

## 2015-03-16 NOTE — Telephone Encounter (Signed)
TC from patient requesting to review her upcoming appts. This was done to patient's satisfaction. No other needs identified.

## 2015-03-20 ENCOUNTER — Ambulatory Visit: Payer: Medicare Other

## 2015-03-20 ENCOUNTER — Other Ambulatory Visit: Payer: Self-pay | Admitting: Hematology and Oncology

## 2015-03-20 ENCOUNTER — Ambulatory Visit (HOSPITAL_BASED_OUTPATIENT_CLINIC_OR_DEPARTMENT_OTHER): Payer: Medicare Other

## 2015-03-20 ENCOUNTER — Telehealth: Payer: Self-pay | Admitting: Hematology and Oncology

## 2015-03-20 ENCOUNTER — Other Ambulatory Visit (HOSPITAL_BASED_OUTPATIENT_CLINIC_OR_DEPARTMENT_OTHER): Payer: Medicare Other

## 2015-03-20 VITALS — BP 128/51 | HR 59 | Temp 97.7°F | Resp 20

## 2015-03-20 DIAGNOSIS — C50912 Malignant neoplasm of unspecified site of left female breast: Secondary | ICD-10-CM | POA: Diagnosis not present

## 2015-03-20 DIAGNOSIS — Z5111 Encounter for antineoplastic chemotherapy: Secondary | ICD-10-CM | POA: Diagnosis not present

## 2015-03-20 DIAGNOSIS — C8590 Non-Hodgkin lymphoma, unspecified, unspecified site: Secondary | ICD-10-CM

## 2015-03-20 DIAGNOSIS — C7951 Secondary malignant neoplasm of bone: Secondary | ICD-10-CM

## 2015-03-20 DIAGNOSIS — Z95828 Presence of other vascular implants and grafts: Secondary | ICD-10-CM

## 2015-03-20 DIAGNOSIS — C8300 Small cell B-cell lymphoma, unspecified site: Secondary | ICD-10-CM

## 2015-03-20 LAB — COMPREHENSIVE METABOLIC PANEL (CC13)
ALK PHOS: 68 U/L (ref 40–150)
ALT: 17 U/L (ref 0–55)
AST: 17 U/L (ref 5–34)
Albumin: 3.2 g/dL — ABNORMAL LOW (ref 3.5–5.0)
Anion Gap: 12 mEq/L — ABNORMAL HIGH (ref 3–11)
BILIRUBIN TOTAL: 0.35 mg/dL (ref 0.20–1.20)
BUN: 11.1 mg/dL (ref 7.0–26.0)
CO2: 24 mEq/L (ref 22–29)
CREATININE: 0.7 mg/dL (ref 0.6–1.1)
Calcium: 9.5 mg/dL (ref 8.4–10.4)
Chloride: 104 mEq/L (ref 98–109)
GLUCOSE: 82 mg/dL (ref 70–140)
Potassium: 3.8 mEq/L (ref 3.5–5.1)
Sodium: 140 mEq/L (ref 136–145)
Total Protein: 6.4 g/dL (ref 6.4–8.3)

## 2015-03-20 LAB — CBC WITH DIFFERENTIAL/PLATELET
BASO%: 1.1 % (ref 0.0–2.0)
Basophils Absolute: 0 10*3/uL (ref 0.0–0.1)
EOS ABS: 0.1 10*3/uL (ref 0.0–0.5)
EOS%: 2.9 % (ref 0.0–7.0)
HCT: 27.8 % — ABNORMAL LOW (ref 34.8–46.6)
HGB: 8.7 g/dL — ABNORMAL LOW (ref 11.6–15.9)
LYMPH%: 8.6 % — ABNORMAL LOW (ref 14.0–49.7)
MCH: 26.8 pg (ref 25.1–34.0)
MCHC: 31.3 g/dL — ABNORMAL LOW (ref 31.5–36.0)
MCV: 85.5 fL (ref 79.5–101.0)
MONO#: 1.2 10*3/uL — AB (ref 0.1–0.9)
MONO%: 31.6 % — ABNORMAL HIGH (ref 0.0–14.0)
NEUT#: 2.1 10*3/uL (ref 1.5–6.5)
NEUT%: 55.8 % (ref 38.4–76.8)
Platelets: 217 10*3/uL (ref 145–400)
RBC: 3.25 10*6/uL — AB (ref 3.70–5.45)
RDW: 16.7 % — AB (ref 11.2–14.5)
WBC: 3.7 10*3/uL — AB (ref 3.9–10.3)
lymph#: 0.3 10*3/uL — ABNORMAL LOW (ref 0.9–3.3)

## 2015-03-20 MED ORDER — SODIUM CHLORIDE 0.9 % IJ SOLN
10.0000 mL | INTRAMUSCULAR | Status: DC | PRN
Start: 2015-03-20 — End: 2015-03-20
  Administered 2015-03-20: 10 mL
  Filled 2015-03-20: qty 10

## 2015-03-20 MED ORDER — SODIUM CHLORIDE 0.9 % IV SOLN
Freq: Once | INTRAVENOUS | Status: AC
  Administered 2015-03-20: 11:00:00 via INTRAVENOUS

## 2015-03-20 MED ORDER — HEPARIN SOD (PORK) LOCK FLUSH 100 UNIT/ML IV SOLN
500.0000 [IU] | Freq: Once | INTRAVENOUS | Status: AC | PRN
  Administered 2015-03-20: 500 [IU]
  Filled 2015-03-20: qty 5

## 2015-03-20 MED ORDER — PROCHLORPERAZINE MALEATE 10 MG PO TABS
ORAL_TABLET | ORAL | Status: AC
  Filled 2015-03-20: qty 1

## 2015-03-20 MED ORDER — PROCHLORPERAZINE MALEATE 10 MG PO TABS
10.0000 mg | ORAL_TABLET | Freq: Once | ORAL | Status: AC
  Administered 2015-03-20: 10 mg via ORAL

## 2015-03-20 MED ORDER — SODIUM CHLORIDE 0.9 % IJ SOLN
10.0000 mL | INTRAMUSCULAR | Status: DC | PRN
  Administered 2015-03-20: 10 mL via INTRAVENOUS
  Filled 2015-03-20: qty 10

## 2015-03-20 MED ORDER — DENOSUMAB 120 MG/1.7ML ~~LOC~~ SOLN
120.0000 mg | Freq: Once | SUBCUTANEOUS | Status: AC
  Administered 2015-03-20: 120 mg via SUBCUTANEOUS
  Filled 2015-03-20: qty 1.7

## 2015-03-20 MED ORDER — SODIUM CHLORIDE 0.9 % IV SOLN
800.0000 mg/m2 | Freq: Once | INTRAVENOUS | Status: AC
  Administered 2015-03-20: 1292 mg via INTRAVENOUS
  Filled 2015-03-20: qty 33.98

## 2015-03-20 NOTE — Patient Instructions (Signed)

## 2015-03-20 NOTE — Patient Instructions (Signed)
[facility] Discharge Instructions for Patients Receiving Chemotherapy  Today you received the following chemotherapy agents: Gemzar.  To help prevent nausea and vomiting after your treatment, we encourage you to take your nausea medication as directed.    If you develop nausea and vomiting that is not controlled by your nausea medication, call the clinic.   BELOW ARE SYMPTOMS THAT SHOULD BE REPORTED IMMEDIATELY:  *FEVER GREATER THAN 100.5 F  *CHILLS WITH OR WITHOUT FEVER  NAUSEA AND VOMITING THAT IS NOT CONTROLLED WITH YOUR NAUSEA MEDICATION  *UNUSUAL SHORTNESS OF BREATH  *UNUSUAL BRUISING OR BLEEDING  TENDERNESS IN MOUTH AND THROAT WITH OR WITHOUT PRESENCE OF ULCERS  *URINARY PROBLEMS  *BOWEL PROBLEMS  UNUSUAL RASH Items with * indicate a potential emergency and should be followed up as soon as possible.  Feel free to call the clinic you have any questions or concerns. The clinic phone number is [phone].  Please show the [place] at check-in to the Emergency Department and triage nurse.  Denosumab injection What is this medicine? DENOSUMAB (den oh sue mab) slows bone breakdown. Prolia is used to treat osteoporosis in women after menopause and in men. Delton See is used to prevent bone fractures and other bone problems caused by cancer bone metastases. Delton See is also used to treat giant cell tumor of the bone. This medicine may be used for other purposes; ask your health care provider or pharmacist if you have questions. COMMON BRAND NAME(S): Prolia, XGEVA What should I tell my health care provider before I take this medicine? They need to know if you have any of these conditions: -dental disease -eczema -infection or history of infections -kidney disease or on dialysis -low blood calcium or vitamin D -malabsorption syndrome -scheduled to have surgery or tooth extraction -taking medicine that contains denosumab -thyroid or parathyroid  disease -an unusual reaction to denosumab, other medicines, foods, dyes, or preservatives -pregnant or trying to get pregnant -breast-feeding How should I use this medicine? This medicine is for injection under the skin. It is given by a health care professional in a hospital or clinic setting. If you are getting Prolia, a special MedGuide will be given to you by the pharmacist with each prescription and refill. Be sure to read this information carefully each time. For Prolia, talk to your pediatrician regarding the use of this medicine in children. Special care may be needed. For Delton See, talk to your pediatrician regarding the use of this medicine in children. While this drug may be prescribed for children as young as 13 years for selected conditions, precautions do apply. Overdosage: If you think you've taken too much of this medicine contact a poison control center or emergency room at once. Overdosage: If you think you have taken too much of this medicine contact a poison control center or emergency room at once. NOTE: This medicine is only for you. Do not share this medicine with others. What if I miss a dose? It is important not to miss your dose. Call your doctor or health care professional if you are unable to keep an appointment. What may interact with this medicine? Do not take this medicine with any of the following medications: -other medicines containing denosumab This medicine may also interact with the following medications: -medicines that suppress the immune system -medicines that treat cancer -steroid medicines like prednisone or cortisone This list may not describe all possible interactions. Give your health care provider a list of all the medicines, herbs, non-prescription drugs,  or dietary supplements you use. Also tell them if you smoke, drink alcohol, or use illegal drugs. Some items may interact with your medicine. What should I watch for while using this medicine? Visit  your doctor or health care professional for regular checks on your progress. Your doctor or health care professional may order blood tests and other tests to see how you are doing. Call your doctor or health care professional if you get a cold or other infection while receiving this medicine. Do not treat yourself. This medicine may decrease your body's ability to fight infection. You should make sure you get enough calcium and vitamin D while you are taking this medicine, unless your doctor tells you not to. Discuss the foods you eat and the vitamins you take with your health care professional. See your dentist regularly. Brush and floss your teeth as directed. Before you have any dental work done, tell your dentist you are receiving this medicine. Do not become pregnant while taking this medicine or for 5 months after stopping it. Women should inform their doctor if they wish to become pregnant or think they might be pregnant. There is a potential for serious side effects to an unborn child. Talk to your health care professional or pharmacist for more information. What side effects may I notice from receiving this medicine? Side effects that you should report to your doctor or health care professional as soon as possible: -allergic reactions like skin rash, itching or hives, swelling of the face, lips, or tongue -breathing problems -chest pain -fast, irregular heartbeat -feeling faint or lightheaded, falls -fever, chills, or any other sign of infection -muscle spasms, tightening, or twitches -numbness or tingling -skin blisters or bumps, or is dry, peels, or red -slow healing or unexplained pain in the mouth or jaw -unusual bleeding or bruising Side effects that usually do not require medical attention (Report these to your doctor or health care professional if they continue or are bothersome.): -muscle pain -stomach upset, gas This list may not describe all possible side effects. Call your  doctor for medical advice about side effects. You may report side effects to FDA at 1-800-FDA-1088. Where should I keep my medicine? This medicine is only given in a clinic, doctor's office, or other health care setting and will not be stored at home. NOTE: This sheet is a summary. It may not cover all possible information. If you have questions about this medicine, talk to your doctor, pharmacist, or health care provider.  2015, [employer] Standard. (2012-04-19 12:37:47)

## 2015-03-20 NOTE — Telephone Encounter (Signed)
Pt confirmed labs/ov per 05/17 POF, gave pt AVS and Calendar.Cherylann Banas, moved MD visit to 05/31 per MD

## 2015-03-30 ENCOUNTER — Ambulatory Visit (HOSPITAL_BASED_OUTPATIENT_CLINIC_OR_DEPARTMENT_OTHER): Payer: Medicare Other

## 2015-03-30 ENCOUNTER — Other Ambulatory Visit (HOSPITAL_BASED_OUTPATIENT_CLINIC_OR_DEPARTMENT_OTHER): Payer: Medicare Other

## 2015-03-30 ENCOUNTER — Ambulatory Visit (HOSPITAL_COMMUNITY)
Admission: RE | Admit: 2015-03-30 | Discharge: 2015-03-30 | Disposition: A | Discharge status: Home / Self Care | Payer: Medicare Other | Source: Ambulatory Visit | Attending: Hematology and Oncology | Admitting: Hematology and Oncology

## 2015-03-30 VITALS — BP 110/84 | HR 66 | Temp 97.7°F

## 2015-03-30 DIAGNOSIS — R590 Localized enlarged lymph nodes: Secondary | ICD-10-CM | POA: Diagnosis not present

## 2015-03-30 DIAGNOSIS — C8590 Non-Hodgkin lymphoma, unspecified, unspecified site: Secondary | ICD-10-CM | POA: Insufficient documentation

## 2015-03-30 DIAGNOSIS — Z95828 Presence of other vascular implants and grafts: Secondary | ICD-10-CM

## 2015-03-30 DIAGNOSIS — C50912 Malignant neoplasm of unspecified site of left female breast: Secondary | ICD-10-CM

## 2015-03-30 DIAGNOSIS — C8300 Small cell B-cell lymphoma, unspecified site: Secondary | ICD-10-CM

## 2015-03-30 DIAGNOSIS — Z853 Personal history of malignant neoplasm of breast: Secondary | ICD-10-CM | POA: Insufficient documentation

## 2015-03-30 LAB — CBC WITH DIFFERENTIAL/PLATELET
BASO%: 0.2 % (ref 0.0–2.0)
Basophils Absolute: 0 10*3/uL (ref 0.0–0.1)
EOS%: 2.9 % (ref 0.0–7.0)
Eosinophils Absolute: 0.1 10*3/uL (ref 0.0–0.5)
HCT: 27 % — ABNORMAL LOW (ref 34.8–46.6)
HEMOGLOBIN: 8.6 g/dL — AB (ref 11.6–15.9)
LYMPH%: 12.1 % — ABNORMAL LOW (ref 14.0–49.7)
MCH: 26.9 pg (ref 25.1–34.0)
MCHC: 31.9 g/dL (ref 31.5–36.0)
MCV: 84.4 fL (ref 79.5–101.0)
MONO#: 1.1 10*3/uL — AB (ref 0.1–0.9)
MONO%: 24.9 % — ABNORMAL HIGH (ref 0.0–14.0)
NEUT#: 2.5 10*3/uL (ref 1.5–6.5)
NEUT%: 59.9 % (ref 38.4–76.8)
Platelets: 207 10*3/uL (ref 145–400)
RBC: 3.2 10*6/uL — AB (ref 3.70–5.45)
RDW: 17 % — AB (ref 11.2–14.5)
WBC: 4.2 10*3/uL (ref 3.9–10.3)
lymph#: 0.5 10*3/uL — ABNORMAL LOW (ref 0.9–3.3)

## 2015-03-30 LAB — COMPREHENSIVE METABOLIC PANEL (CC13)
ALBUMIN: 3.3 g/dL — AB (ref 3.5–5.0)
ALT: 21 U/L (ref 0–55)
AST: 20 U/L (ref 5–34)
Alkaline Phosphatase: 68 U/L (ref 40–150)
Anion Gap: 12 mEq/L — ABNORMAL HIGH (ref 3–11)
BUN: 6.1 mg/dL — ABNORMAL LOW (ref 7.0–26.0)
CO2: 23 mEq/L (ref 22–29)
CREATININE: 0.7 mg/dL (ref 0.6–1.1)
Calcium: 8.2 mg/dL — ABNORMAL LOW (ref 8.4–10.4)
Chloride: 106 mEq/L (ref 98–109)
Glucose: 78 mg/dl (ref 70–140)
Potassium: 3.3 mEq/L — ABNORMAL LOW (ref 3.5–5.1)
SODIUM: 142 meq/L (ref 136–145)
Total Bilirubin: 0.39 mg/dL (ref 0.20–1.20)
Total Protein: 6.6 g/dL (ref 6.4–8.3)

## 2015-03-30 MED ORDER — IOHEXOL 300 MG/ML  SOLN
100.0000 mL | Freq: Once | INTRAMUSCULAR | Status: AC | PRN
  Administered 2015-03-30: 100 mL via INTRAVENOUS

## 2015-03-30 MED ORDER — SODIUM CHLORIDE 0.9 % IJ SOLN
10.0000 mL | INTRAMUSCULAR | Status: DC | PRN
  Administered 2015-03-30: 10 mL via INTRAVENOUS
  Filled 2015-03-30: qty 10

## 2015-03-30 MED ORDER — HEPARIN SOD (PORK) LOCK FLUSH 100 UNIT/ML IV SOLN
500.0000 [IU] | Freq: Once | INTRAVENOUS | Status: AC
  Administered 2015-03-30: 500 [IU] via INTRAVENOUS
  Filled 2015-03-30: qty 5

## 2015-03-30 NOTE — Patient Instructions (Signed)

## 2015-03-30 NOTE — Progress Notes (Signed)
Patient in for [facility] access and labs. Patient's PAC accessed and flushed without any difficulty. Blood return noted with flush and labs obtained. Patient has a CT appointment today. PAC left accessed and covered with a Tegaderm dressing. Instructed Patient to have needle removed after CT appointment today or to return to the [place] Room to have needle removed after CT appointment. Patient verbalized understanding. Patient ambulating and discharged from the Flush Room without any complaints.

## 2015-04-03 ENCOUNTER — Ambulatory Visit (HOSPITAL_BASED_OUTPATIENT_CLINIC_OR_DEPARTMENT_OTHER): Payer: Medicare Other | Admitting: Hematology and Oncology

## 2015-04-03 ENCOUNTER — Telehealth: Payer: Self-pay | Admitting: Hematology and Oncology

## 2015-04-03 ENCOUNTER — Other Ambulatory Visit: Payer: Self-pay

## 2015-04-03 ENCOUNTER — Encounter: Payer: Self-pay | Admitting: Hematology and Oncology

## 2015-04-03 ENCOUNTER — Telehealth: Payer: Self-pay | Admitting: *Deleted

## 2015-04-03 ENCOUNTER — Ambulatory Visit (HOSPITAL_BASED_OUTPATIENT_CLINIC_OR_DEPARTMENT_OTHER): Payer: Medicare Other

## 2015-04-03 VITALS — BP 145/79 | HR 81 | Temp 98.3°F | Resp 18 | Ht 62.0 in | Wt 127.0 lb

## 2015-04-03 DIAGNOSIS — Z5111 Encounter for antineoplastic chemotherapy: Secondary | ICD-10-CM

## 2015-04-03 DIAGNOSIS — C50912 Malignant neoplasm of unspecified site of left female breast: Secondary | ICD-10-CM

## 2015-04-03 DIAGNOSIS — D630 Anemia in neoplastic disease: Secondary | ICD-10-CM

## 2015-04-03 DIAGNOSIS — C8300 Small cell B-cell lymphoma, unspecified site: Secondary | ICD-10-CM

## 2015-04-03 DIAGNOSIS — C7951 Secondary malignant neoplasm of bone: Secondary | ICD-10-CM | POA: Diagnosis not present

## 2015-04-03 DIAGNOSIS — J168 Pneumonia due to other specified infectious organisms: Secondary | ICD-10-CM | POA: Diagnosis not present

## 2015-04-03 HISTORY — DX: Pneumonia due to other specified infectious organisms: J16.8

## 2015-04-03 MED ORDER — PROCHLORPERAZINE MALEATE 10 MG PO TABS
ORAL_TABLET | ORAL | Status: AC
Start: 2015-04-03 — End: 2015-04-03
  Filled 2015-04-03: qty 1

## 2015-04-03 MED ORDER — PROCHLORPERAZINE MALEATE 10 MG PO TABS
10.0000 mg | ORAL_TABLET | Freq: Once | ORAL | Status: AC
  Administered 2015-04-03: 10 mg via ORAL

## 2015-04-03 MED ORDER — HEPARIN SOD (PORK) LOCK FLUSH 100 UNIT/ML IV SOLN
500.0000 [IU] | Freq: Once | INTRAVENOUS | Status: AC | PRN
  Administered 2015-04-03: 500 [IU]
  Filled 2015-04-03: qty 5

## 2015-04-03 MED ORDER — SODIUM CHLORIDE 0.9 % IV SOLN
800.0000 mg/m2 | Freq: Once | INTRAVENOUS | Status: AC
  Administered 2015-04-03: 1292 mg via INTRAVENOUS
  Filled 2015-04-03: qty 33.98

## 2015-04-03 MED ORDER — AZITHROMYCIN 500 MG PO TABS: 500.0000 mg | ORAL_TABLET | Freq: Every day | ORAL | Status: DC

## 2015-04-03 MED ORDER — SODIUM CHLORIDE 0.9 % IJ SOLN
10.0000 mL | INTRAMUSCULAR | Status: DC | PRN
  Administered 2015-04-03: 10 mL
  Filled 2015-04-03: qty 10

## 2015-04-03 MED ORDER — SODIUM CHLORIDE 0.9 % IV SOLN
Freq: Once | INTRAVENOUS | Status: AC
  Administered 2015-04-03: 11:00:00 via INTRAVENOUS

## 2015-04-03 NOTE — Assessment & Plan Note (Signed)
She was started with Gemzar and tolerated treatment well. I will continue the same dose. Even though she has very mild pancytopenia, I suspect the cause of the pancytopenia is more related to Ibrutinib. CT scan show stable disease in the liver. I will continue same treatment and restage again in 3-4 months, next imaging study would be August 2016.

## 2015-04-03 NOTE — Assessment & Plan Note (Signed)
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 She had recent dental extraction. She has bone pain likely related to hypocalcemia from [facility]. I recommend high-dose vitamin D and calcium supplement.

## 2015-04-03 NOTE — Telephone Encounter (Signed)
Per staff message and POF I have scheduled appts. Advised scheduler of appts. JMW  

## 2015-04-03 NOTE — Assessment & Plan Note (Signed)
This is likely due to recent treatment. The patient denies recent history of bleeding such as epistaxis, hematuria or hematochezia. She is asymptomatic from the anemia. I will observe for now.   

## 2015-04-03 NOTE — Assessment & Plan Note (Signed)
The size of the lymphadenopathy is improving. She is responding to Ibrutinib . She cannot tolerate 3 tablets daily but since I reduced the dose, she is doing well on 2 tablets daily.. CT scan showed positive response to treatment. Continue same.

## 2015-04-03 NOTE — Progress Notes (Signed)
[place] OFFICE PROGRESS NOTE  Patient Care Team: No Pcp Per Patient as PCP - General (General Practice) [name], MD as Consulting Physician (Hematology and Oncology)  SUMMARY OF ONCOLOGIC HISTORY:   Cancer of left breast   08/03/2008 Initial Diagnosis Breast CA   11/06/2014 Imaging Repeat CT scan of the chest, abdomen and pelvis show regression in the size of liver metastasis and pulmonary metastasis with mild progression of lymphadenopathy   11/06/2014 Tumor Marker [id number] is elevated at 275   01/18/2015 Imaging MRI head is negative for metastatic disease   01/18/2015 Imaging CT scan of the chest, abdomen and pelvis show significant disease progression throughout.   01/25/2015 -  Chemotherapy She started Gemzar every other week and Ibrutinib   02/20/2015 Tumor Marker CA 27-29 at 246   03/30/2015 Imaging Repeat CT scan of the chest, abdomen and pelvis show positive response to treatment for lymphoma. Liver lesions are stable.    INTERVAL HISTORY: Please see below for problem oriented charting. She returns today for further follow-up. She complained of diffuse bone pain. She also had mild nonproductive cough. Denies fevers or chills. She is not taking calcium or vitamin D supplement consistently. She denies further diarrhea.  REVIEW OF SYSTEMS:   Constitutional: Denies fevers, chills or abnormal weight loss Eyes: Denies blurriness of vision Ears, nose, mouth, throat, and face: Denies mucositis or sore throat Cardiovascular: Denies palpitation, chest discomfort or lower extremity swelling Gastrointestinal:  Denies nausea, heartburn or change in bowel habits Skin: Denies abnormal skin rashes Lymphatics: Denies new lymphadenopathy or easy bruising Neurological:Denies numbness, tingling or new weaknesses Behavioral/Psych: Mood is stable, no new changes  All other systems were reviewed with the patient and are negative.  I have reviewed the past medical history, past  surgical history, social history and family history with the patient and they are unchanged from previous note.  ALLERGIES:  is allergic to codeine; penicillins; sulfonamide derivatives; tape; and tramadol hcl.  MEDICATIONS:  Current Outpatient Prescriptions  Medication Sig Dispense Refill  . HYDROcodone-acetaminophen (NORCO) 5-325 MG per tablet Take 1 tablet by mouth every 6 (six) hours as needed for moderate pain. 60 tablet 0  . ibrutinib (IMBRUVICA) 140 MG capsul Take 2 capsules (280 mg total) by mouth daily. 90 capsule 0  . lidocaine-prilocaine (EMLA) cream Apply 1 application topically as needed. 30 g 6  . azithromycin (ZITHROMAX) 500 MG tablet Take 1 tablet (500 mg total) by mouth daily. 3 tablet 0   No current facility-administered medications for this visit.    PHYSICAL EXAMINATION: ECOG PERFORMANCE STATUS: 1 - Symptomatic but completely ambulatory  Filed Vitals:   04/03/15 1009  BP: 145/79  Pulse: 81  Temp: 98.3 F (36.8 C)  Resp: 18   Filed Weights   04/03/15 1009  Weight: 127 lb (57.607 kg)    GENERAL:alert, no distress and comfortable SKIN: skin color, texture, turgor are normal, no rashes or significant lesions EYES: normal, Conjunctiva are pink and non-injected, sclera clear OROPHARYNX:no exudate, no erythema and lips, buccal mucosa, and tongue normal  NECK: supple, thyroid normal size, non-tender, without nodularity LYMPH:  no palpable lymphadenopathy in the cervical, axillary or inguinal LUNGS: clear to auscultation and percussion with normal breathing effort HEART: regular rate & rhythm and no murmurs and no lower extremity edema ABDOMEN:abdomen soft, non-tender and normal bowel sounds Musculoskeletal:no cyanosis of digits and no clubbing  NEURO: alert & oriented x 3 with fluent speech, no focal motor/sensory deficits  LABORATORY DATA:  I  have reviewed the data as listed    Component Value Date/Time   NA 142 03/30/2015 1359   NA 139 01/13/2015 1730    NA 142 06/01/2012 1008   K 3.3* 03/30/2015 1359   K 4.0 01/13/2015 1730   K 3.8 06/01/2012 1008   CL 103 01/13/2015 1730   CL 107 04/22/2013 0922   CL 103 06/01/2012 1008   CO2 23 03/30/2015 1359   CO2 25 06/13/2014 0819   CO2 25 06/01/2012 1008   GLUCOSE 78 03/30/2015 1359   GLUCOSE 93 01/13/2015 1730   GLUCOSE 89 04/22/2013 0922   GLUCOSE 108 06/01/2012 1008   BUN 6.1* 03/30/2015 1359   BUN 12 01/13/2015 1730   BUN 9 06/01/2012 1008   CREATININE 0.7 03/30/2015 1359   CREATININE 0.80 01/13/2015 1730   CREATININE 0.9 06/01/2012 1008   CALCIUM 8.2* 03/30/2015 1359   CALCIUM 10.8* 06/13/2014 0819   CALCIUM 9.8 06/01/2012 1008   PROT 6.6 03/30/2015 1359   PROT 7.5 06/13/2014 0819   PROT 8.1 08/14/2010 1251   ALBUMIN 3.3* 03/30/2015 1359   ALBUMIN 3.9 06/13/2014 0819   AST 20 03/30/2015 1359   AST 23 06/13/2014 0819   AST 19 08/14/2010 1251   ALT 21 03/30/2015 1359   ALT 13 06/13/2014 0819   ALT 9* 08/14/2010 1251   ALKPHOS 68 03/30/2015 1359   ALKPHOS 89 06/13/2014 0819   ALKPHOS 78 08/14/2010 1251   BILITOT 0.39 03/30/2015 1359   BILITOT 0.2* 06/13/2014 0819   BILITOT 0.40 08/14/2010 1251   GFRNONAA 87* 07/06/2012 1122   GFRAA >90 07/06/2012 1122    No results found for: SPEP, UPEP  Lab Results  Component Value Date   WBC 4.2 03/30/2015   NEUTROABS 2.5 03/30/2015   HGB 8.6* 03/30/2015   HCT 27.0* 03/30/2015   MCV 84.4 03/30/2015   PLT 207 03/30/2015      Chemistry      Component Value Date/Time   NA 142 03/30/2015 1359   NA 139 01/13/2015 1730   NA 142 06/01/2012 1008   K 3.3* 03/30/2015 1359   K 4.0 01/13/2015 1730   K 3.8 06/01/2012 1008   CL 103 01/13/2015 1730   CL 107 04/22/2013 0922   CL 103 06/01/2012 1008   CO2 23 03/30/2015 1359   CO2 25 06/13/2014 0819   CO2 25 06/01/2012 1008   BUN 6.1* 03/30/2015 1359   BUN 12 01/13/2015 1730   BUN 9 06/01/2012 1008   CREATININE 0.7 03/30/2015 1359   CREATININE 0.80 01/13/2015 1730   CREATININE 0.9  06/01/2012 1008      Component Value Date/Time   CALCIUM 8.2* 03/30/2015 1359   CALCIUM 10.8* 06/13/2014 0819   CALCIUM 9.8 06/01/2012 1008   ALKPHOS 68 03/30/2015 1359   ALKPHOS 89 06/13/2014 0819   ALKPHOS 78 08/14/2010 1251   AST 20 03/30/2015 1359   AST 23 06/13/2014 0819   AST 19 08/14/2010 1251   ALT 21 03/30/2015 1359   ALT 13 06/13/2014 0819   ALT 9* 08/14/2010 1251   BILITOT 0.39 03/30/2015 1359   BILITOT 0.2* 06/13/2014 0819   BILITOT 0.40 08/14/2010 1251       RADIOGRAPHIC STUDIES: Recent imaging studies show positive response to treatment but incidental finding of possible pneumonia I have personally reviewed the radiological images as listed and agreed with the findings in the report.   ASSESSMENT & PLAN:  Cancer of left breast She was started with Gemzar and tolerated  treatment well. I will continue the same dose. Even though she has very mild pancytopenia, I suspect the cause of the pancytopenia is more related to Ibrutinib. CT scan show stable disease in the liver. I will continue same treatment and restage again in 3-4 months, next imaging study would be August 2016.     Lymphoma, small lymphocytic The size of the lymphadenopathy is improving. She is responding to Ibrutinib . She cannot tolerate 3 tablets daily but since I reduced the dose, she is doing well on 2 tablets daily.. CT scan showed positive response to treatment. Continue same.      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 She had recent dental extraction. She has bone pain likely related to hypocalcemia from [facility]. I recommend high-dose vitamin D and calcium supplement.     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 The patient denies recent history of bleeding such as epistaxis, hematuria or hematochezia. She is asymptomatic from the anemia. I will observe for now.     Pneumonia due to other specified infectious  organisms She has signs of pneumonia on recent imaging study. I recommend a course of anti-biotic therapy. I will not hold treatment today as she is relatively asymptomatic.    No orders of the defined types were placed in this encounter.   All questions were answered. The patient knows to call the clinic with any problems, questions or concerns. No barriers to learning was detected. I spent 30 minutes counseling the patient face to face. The total time spent in the appointment was 40 minutes and more than 50% was on counseling and review of test results     [facility] [place], MD 04/03/2015 10:42 AM

## 2015-04-03 NOTE — Patient Instructions (Signed)
[place] Discharge Instructions for Patients Receiving Chemotherapy  Today you received the following chemotherapy agents: Gemzar.  To help prevent nausea and vomiting after your treatment, we encourage you to take your nausea medication as directed.    If you develop nausea and vomiting that is not controlled by your nausea medication, call the clinic.   BELOW ARE SYMPTOMS THAT SHOULD BE REPORTED IMMEDIATELY:  *FEVER GREATER THAN 100.5 F  *CHILLS WITH OR WITHOUT FEVER  NAUSEA AND VOMITING THAT IS NOT CONTROLLED WITH YOUR NAUSEA MEDICATION  *UNUSUAL SHORTNESS OF BREATH  *UNUSUAL BRUISING OR BLEEDING  TENDERNESS IN MOUTH AND THROAT WITH OR WITHOUT PRESENCE OF ULCERS  *URINARY PROBLEMS  *BOWEL PROBLEMS  UNUSUAL RASH Items with * indicate a potential emergency and should be followed up as soon as possible.  Feel free to call the clinic you have any questions or concerns. The clinic phone number is [phone].  Please show the [facility] at check-in to the Emergency Department and triage nurse.  Denosumab injection What is this medicine? DENOSUMAB (den oh sue mab) slows bone breakdown. Prolia is used to treat osteoporosis in women after menopause and in men. Delton See is used to prevent bone fractures and other bone problems caused by cancer bone metastases. Delton See is also used to treat giant cell tumor of the bone. This medicine may be used for other purposes; ask your health care provider or pharmacist if you have questions. COMMON BRAND NAME(S): Prolia, XGEVA What should I tell my health care provider before I take this medicine? They need to know if you have any of these conditions: -dental disease -eczema -infection or history of infections -kidney disease or on dialysis -low blood calcium or vitamin D -malabsorption syndrome -scheduled to have surgery or tooth extraction -taking medicine that contains denosumab -thyroid or parathyroid  disease -an unusual reaction to denosumab, other medicines, foods, dyes, or preservatives -pregnant or trying to get pregnant -breast-feeding How should I use this medicine? This medicine is for injection under the skin. It is given by a health care professional in a hospital or clinic setting. If you are getting Prolia, a special MedGuide will be given to you by the pharmacist with each prescription and refill. Be sure to read this information carefully each time. For Prolia, talk to your pediatrician regarding the use of this medicine in children. Special care may be needed. For Delton See, talk to your pediatrician regarding the use of this medicine in children. While this drug may be prescribed for children as young as 13 years for selected conditions, precautions do apply. Overdosage: If you think you've taken too much of this medicine contact a poison control center or emergency room at once. Overdosage: If you think you have taken too much of this medicine contact a poison control center or emergency room at once. NOTE: This medicine is only for you. Do not share this medicine with others. What if I miss a dose? It is important not to miss your dose. Call your doctor or health care professional if you are unable to keep an appointment. What may interact with this medicine? Do not take this medicine with any of the following medications: -other medicines containing denosumab This medicine may also interact with the following medications: -medicines that suppress the immune system -medicines that treat cancer -steroid medicines like prednisone or cortisone This list may not describe all possible interactions. Give your health care provider a list of all the medicines, herbs, non-prescription drugs,  or dietary supplements you use. Also tell them if you smoke, drink alcohol, or use illegal drugs. Some items may interact with your medicine. What should I watch for while using this medicine? Visit  your doctor or health care professional for regular checks on your progress. Your doctor or health care professional may order blood tests and other tests to see how you are doing. Call your doctor or health care professional if you get a cold or other infection while receiving this medicine. Do not treat yourself. This medicine may decrease your body's ability to fight infection. You should make sure you get enough calcium and vitamin D while you are taking this medicine, unless your doctor tells you not to. Discuss the foods you eat and the vitamins you take with your health care professional. See your dentist regularly. Brush and floss your teeth as directed. Before you have any dental work done, tell your dentist you are receiving this medicine. Do not become pregnant while taking this medicine or for 5 months after stopping it. Women should inform their doctor if they wish to become pregnant or think they might be pregnant. There is a potential for serious side effects to an unborn child. Talk to your health care professional or pharmacist for more information. What side effects may I notice from receiving this medicine? Side effects that you should report to your doctor or health care professional as soon as possible: -allergic reactions like skin rash, itching or hives, swelling of the face, lips, or tongue -breathing problems -chest pain -fast, irregular heartbeat -feeling faint or lightheaded, falls -fever, chills, or any other sign of infection -muscle spasms, tightening, or twitches -numbness or tingling -skin blisters or bumps, or is dry, peels, or red -slow healing or unexplained pain in the mouth or jaw -unusual bleeding or bruising Side effects that usually do not require medical attention (Report these to your doctor or health care professional if they continue or are bothersome.): -muscle pain -stomach upset, gas This list may not describe all possible side effects. Call your  doctor for medical advice about side effects. You may report side effects to FDA at 1-800-FDA-1088. Where should I keep my medicine? This medicine is only given in a clinic, doctor's office, or other health care setting and will not be stored at home. NOTE: This sheet is a summary. It may not cover all possible information. If you have questions about this medicine, talk to your doctor, pharmacist, or health care provider.  2015, [employer]vier/Gold Standard. (2012-04-19 12:37:47)

## 2015-04-03 NOTE — Telephone Encounter (Signed)
Pt confirmed labs/ov per 05/31 POF, gave pt AVS and Calendar..... KJ, sent msg to add chemo °

## 2015-04-03 NOTE — Assessment & Plan Note (Signed)
She has signs of pneumonia on recent imaging study. I recommend a course of anti-biotic therapy. I will not hold treatment today as she is relatively asymptomatic.

## 2015-04-05 NOTE — Telephone Encounter (Signed)
Mailed schedule to pt per pt's request.... KJ

## 2015-04-09 ENCOUNTER — Ambulatory Visit: Payer: Self-pay | Admitting: Hematology and Oncology

## 2015-04-09 ENCOUNTER — Encounter: Payer: Self-pay | Admitting: Hematology and Oncology

## 2015-04-09 NOTE — Progress Notes (Signed)
I placed fmla forms for [place] (daughter) on desk of nurse for dr. Alvy Bimler.

## 2015-04-10 ENCOUNTER — Encounter: Payer: Self-pay | Admitting: Hematology and Oncology

## 2015-04-10 NOTE — Progress Notes (Signed)
I called and left message for patient or gale to call me back. cking on if she wants the fmla forms faxed or mailed to them direct.

## 2015-04-10 NOTE — Progress Notes (Signed)
I called home ph# no answer to let patient know the fmla forms for Helen [name] are ready and if she wanted to pick up or fax.

## 2015-04-11 ENCOUNTER — Encounter: Payer: Self-pay | Admitting: Hematology and Oncology

## 2015-04-11 NOTE — Progress Notes (Signed)
I spoke with patient and she said mail to her and go ahead and fax. She will let Madaline Savage know.

## 2015-04-17 ENCOUNTER — Ambulatory Visit (HOSPITAL_COMMUNITY)
Admission: RE | Admit: 2015-04-17 | Discharge: 2015-04-17 | Disposition: A | Discharge status: Home / Self Care | Payer: Medicare Other | Source: Ambulatory Visit | Attending: Hematology and Oncology | Admitting: Hematology and Oncology

## 2015-04-17 ENCOUNTER — Ambulatory Visit (HOSPITAL_BASED_OUTPATIENT_CLINIC_OR_DEPARTMENT_OTHER): Payer: Medicare Other

## 2015-04-17 ENCOUNTER — Encounter: Payer: Self-pay | Admitting: Hematology and Oncology

## 2015-04-17 ENCOUNTER — Ambulatory Visit: Payer: Medicare Other

## 2015-04-17 ENCOUNTER — Other Ambulatory Visit: Payer: Self-pay | Admitting: Hematology and Oncology

## 2015-04-17 ENCOUNTER — Other Ambulatory Visit (HOSPITAL_BASED_OUTPATIENT_CLINIC_OR_DEPARTMENT_OTHER): Payer: Medicare Other

## 2015-04-17 VITALS — BP 137/56 | HR 76 | Temp 98.0°F | Resp 18

## 2015-04-17 DIAGNOSIS — D630 Anemia in neoplastic disease: Secondary | ICD-10-CM

## 2015-04-17 DIAGNOSIS — C801 Malignant (primary) neoplasm, unspecified: Secondary | ICD-10-CM | POA: Diagnosis not present

## 2015-04-17 DIAGNOSIS — C50912 Malignant neoplasm of unspecified site of left female breast: Secondary | ICD-10-CM

## 2015-04-17 DIAGNOSIS — C7951 Secondary malignant neoplasm of bone: Secondary | ICD-10-CM | POA: Diagnosis not present

## 2015-04-17 DIAGNOSIS — E876 Hypokalemia: Secondary | ICD-10-CM

## 2015-04-17 DIAGNOSIS — C8300 Small cell B-cell lymphoma, unspecified site: Secondary | ICD-10-CM

## 2015-04-17 DIAGNOSIS — Z5111 Encounter for antineoplastic chemotherapy: Secondary | ICD-10-CM

## 2015-04-17 DIAGNOSIS — Z95828 Presence of other vascular implants and grafts: Secondary | ICD-10-CM

## 2015-04-17 HISTORY — DX: Hypokalemia: E87.6

## 2015-04-17 LAB — CBC WITH DIFFERENTIAL/PLATELET
BASO%: 0.9 % (ref 0.0–2.0)
Basophils Absolute: 0 10*3/uL (ref 0.0–0.1)
EOS ABS: 0.2 10*3/uL (ref 0.0–0.5)
EOS%: 5.4 % (ref 0.0–7.0)
HEMATOCRIT: 24.5 % — AB (ref 34.8–46.6)
HEMOGLOBIN: 7.5 g/dL — AB (ref 11.6–15.9)
LYMPH%: 13.9 % — AB (ref 14.0–49.7)
MCH: 25.7 pg (ref 25.1–34.0)
MCHC: 30.6 g/dL — AB (ref 31.5–36.0)
MCV: 83.9 fL (ref 79.5–101.0)
MONO#: 0.9 10*3/uL (ref 0.1–0.9)
MONO%: 26.8 % — ABNORMAL HIGH (ref 0.0–14.0)
NEUT#: 1.7 10*3/uL (ref 1.5–6.5)
NEUT%: 53 % (ref 38.4–76.8)
Platelets: 333 10*3/uL (ref 145–400)
RBC: 2.92 10*6/uL — ABNORMAL LOW (ref 3.70–5.45)
RDW: 17.8 % — ABNORMAL HIGH (ref 11.2–14.5)
WBC: 3.2 10*3/uL — AB (ref 3.9–10.3)
lymph#: 0.4 10*3/uL — ABNORMAL LOW (ref 0.9–3.3)

## 2015-04-17 LAB — HOLD TUBE, BLOOD BANK

## 2015-04-17 LAB — COMPREHENSIVE METABOLIC PANEL (CC13)
ALBUMIN: 2.7 g/dL — AB (ref 3.5–5.0)
ALT: 29 U/L (ref 0–55)
ANION GAP: 12 meq/L — AB (ref 3–11)
AST: 20 U/L (ref 5–34)
Alkaline Phosphatase: 74 U/L (ref 40–150)
BILIRUBIN TOTAL: 0.24 mg/dL (ref 0.20–1.20)
BUN: 7.6 mg/dL (ref 7.0–26.0)
CO2: 25 mEq/L (ref 22–29)
Calcium: 8.8 mg/dL (ref 8.4–10.4)
Chloride: 106 mEq/L (ref 98–109)
Creatinine: 0.7 mg/dL (ref 0.6–1.1)
EGFR: >90 mL/min/{1.73_m2} (ref >90)
Glucose: 123 mg/dl (ref 70–140)
POTASSIUM: 3.1 meq/L — AB (ref 3.5–5.1)
SODIUM: 142 meq/L (ref 136–145)
TOTAL PROTEIN: 6.1 g/dL — AB (ref 6.4–8.3)

## 2015-04-17 LAB — PREPARE RBC (CROSSMATCH)

## 2015-04-17 MED ORDER — SODIUM CHLORIDE 0.9 % IV SOLN
800.0000 mg/m2 | Freq: Once | INTRAVENOUS | Status: AC
  Administered 2015-04-17: 1292 mg via INTRAVENOUS
  Filled 2015-04-17: qty 33.98

## 2015-04-17 MED ORDER — SODIUM CHLORIDE 0.9 % IV SOLN
Freq: Once | INTRAVENOUS | Status: AC
  Administered 2015-04-17: 11:00:00 via INTRAVENOUS

## 2015-04-17 MED ORDER — PROCHLORPERAZINE MALEATE 10 MG PO TABS
ORAL_TABLET | ORAL | Status: AC
  Filled 2015-04-17: qty 1

## 2015-04-17 MED ORDER — SODIUM CHLORIDE 0.9 % IJ SOLN
10.0000 mL | INTRAMUSCULAR | Status: DC | PRN
  Administered 2015-04-17: 10 mL via INTRAVENOUS
  Filled 2015-04-17: qty 10

## 2015-04-17 MED ORDER — POTASSIUM CHLORIDE CRYS ER 20 MEQ PO TBCR: 20.0000 meq | EXTENDED_RELEASE_TABLET | Freq: Two times a day (BID) | ORAL | Status: DC

## 2015-04-17 MED ORDER — PROCHLORPERAZINE MALEATE 10 MG PO TABS
10.0000 mg | ORAL_TABLET | Freq: Once | ORAL | Status: AC
  Administered 2015-04-17: 10 mg via ORAL

## 2015-04-17 MED ORDER — SODIUM CHLORIDE 0.9 % IJ SOLN
10.0000 mL | INTRAMUSCULAR | Status: DC | PRN
  Administered 2015-04-17: 10 mL
  Filled 2015-04-17: qty 10

## 2015-04-17 MED ORDER — HEPARIN SOD (PORK) LOCK FLUSH 100 UNIT/ML IV SOLN
500.0000 [IU] | Freq: Once | INTRAVENOUS | Status: AC | PRN
  Administered 2015-04-17: 500 [IU]
  Filled 2015-04-17: qty 5

## 2015-04-17 NOTE — Progress Notes (Signed)
OK to treat with Hgb: 7.5. Patient will receive 1 unit of blood today. Patient potassium 3.1. Prescription sent into [facility].

## 2015-04-17 NOTE — Patient Instructions (Signed)

## 2015-04-17 NOTE — Patient Instructions (Addendum)
[facility] Discharge Instructions for Patients Receiving Chemotherapy  Today you received the following chemotherapy agents Gemcitabine.   To help prevent nausea and vomiting after your treatment, we encourage you to take your nausea medication as directed.    If you develop nausea and vomiting that is not controlled by your nausea medication, call the clinic.   BELOW ARE SYMPTOMS THAT SHOULD BE REPORTED IMMEDIATELY:  *FEVER GREATER THAN 100.5 F  *CHILLS WITH OR WITHOUT FEVER  NAUSEA AND VOMITING THAT IS NOT CONTROLLED WITH YOUR NAUSEA MEDICATION  *UNUSUAL SHORTNESS OF BREATH  *UNUSUAL BRUISING OR BLEEDING  TENDERNESS IN MOUTH AND THROAT WITH OR WITHOUT PRESENCE OF ULCERS  *URINARY PROBLEMS  *BOWEL PROBLEMS  UNUSUAL RASH Items with * indicate a potential emergency and should be followed up as soon as possible.  Feel free to call the clinic you have any questions or concerns. The clinic phone number is [phone].  Please show the [facility] at check-in to the Emergency Department and triage nurse.  Blood Transfusion Information WHAT IS A BLOOD TRANSFUSION? A transfusion is the replacement of blood or some of its parts. Blood is made up of multiple cells which provide different functions.  Red blood cells carry oxygen and are used for blood loss replacement.  White blood cells fight against infection.  Platelets control bleeding.  Plasma helps clot blood.  Other blood products are available for specialized needs, such as hemophilia or other clotting disorders. BEFORE THE TRANSFUSION  Who gives blood for transfusions?   You may be able to donate blood to be used at a later date on yourself (autologous donation).  Relatives can be asked to donate blood. This is generally not any safer than if you have received blood from a stranger. The same precautions are taken to ensure safety when a relative's blood is donated.  Healthy volunteers who  are fully evaluated to make sure their blood is safe. This is blood bank blood. Transfusion therapy is the safest it has ever been in the practice of medicine. Before blood is taken from a donor, a complete history is taken to make sure that person has no history of diseases nor engages in risky social behavior (examples are intravenous drug use or sexual activity with multiple partners). The donor's travel history is screened to minimize risk of transmitting infections, such as malaria. The donated blood is tested for signs of infectious diseases, such as HIV and hepatitis. The blood is then tested to be sure it is compatible with you in order to minimize the chance of a transfusion reaction. If you or a relative donates blood, this is often done in anticipation of surgery and is not appropriate for emergency situations. It takes many days to process the donated blood. RISKS AND COMPLICATIONS Although transfusion therapy is very safe and saves many lives, the main dangers of transfusion include:   Getting an infectious disease.  Developing a transfusion reaction. This is an allergic reaction to something in the blood you were given. Every precaution is taken to prevent this. The decision to have a blood transfusion has been considered carefully by your caregiver before blood is given. Blood is not given unless the benefits outweigh the risks. AFTER THE TRANSFUSION  Right after receiving a blood transfusion, you will usually feel much better and more energetic. This is especially true if your red blood cells have gotten low (anemic). The transfusion raises the level of the red blood cells which carry oxygen,  and this usually causes an energy increase.  The nurse administering the transfusion will monitor you carefully for complications. HOME CARE INSTRUCTIONS  No special instructions are needed after a transfusion. You may find your energy is better. Speak with your caregiver about any limitations on  activity for underlying diseases you may have. SEEK MEDICAL CARE IF:   Your condition is not improving after your transfusion.  You develop redness or irritation at the intravenous (IV) site. SEEK IMMEDIATE MEDICAL CARE IF:  Any of the following symptoms occur over the next 12 hours:  Shaking chills.  You have a temperature by mouth above 102 F (38.9 C), not controlled by medicine.  Chest, back, or muscle pain.  People around you feel you are not acting correctly or are confused.  Shortness of breath or difficulty breathing.  Dizziness and fainting.  You get a rash or develop hives.  You have a decrease in urine output.  Your urine turns a dark color or changes to pink, red, or brown. Any of the following symptoms occur over the next 10 days:  You have a temperature by mouth above 102 F (38.9 C), not controlled by medicine.  Shortness of breath.  Weakness after normal activity.  The white part of the eye turns yellow (jaundice).  You have a decrease in the amount of urine or are urinating less often.  Your urine turns a dark color or changes to pink, red, or brown. Document Released: 10/17/2000 Document Revised: 01/12/2012 Document Reviewed: 06/05/2008 [facility] Patient Information 2015 [place], [place]. This information is not intended to replace advice given to you by your health care provider. Make sure you discuss any questions you have with your health care provider.

## 2015-04-18 LAB — TYPE AND SCREEN
ABO/RH(D): O POS
ANTIBODY SCREEN: NEGATIVE
Unit division: 0

## 2015-05-01 ENCOUNTER — Other Ambulatory Visit (HOSPITAL_BASED_OUTPATIENT_CLINIC_OR_DEPARTMENT_OTHER): Payer: Medicare Other

## 2015-05-01 ENCOUNTER — Telehealth: Payer: Self-pay | Admitting: Hematology and Oncology

## 2015-05-01 ENCOUNTER — Ambulatory Visit (HOSPITAL_BASED_OUTPATIENT_CLINIC_OR_DEPARTMENT_OTHER): Payer: Medicare Other | Admitting: Hematology and Oncology

## 2015-05-01 ENCOUNTER — Encounter: Payer: Self-pay | Admitting: Hematology and Oncology

## 2015-05-01 ENCOUNTER — Ambulatory Visit: Payer: Medicare Other

## 2015-05-01 ENCOUNTER — Ambulatory Visit (HOSPITAL_BASED_OUTPATIENT_CLINIC_OR_DEPARTMENT_OTHER): Payer: Medicare Other

## 2015-05-01 VITALS — BP 142/54 | HR 98 | Temp 98.0°F | Resp 18 | Ht 62.0 in | Wt 127.7 lb

## 2015-05-01 DIAGNOSIS — D630 Anemia in neoplastic disease: Secondary | ICD-10-CM | POA: Diagnosis not present

## 2015-05-01 DIAGNOSIS — D61818 Other pancytopenia: Secondary | ICD-10-CM | POA: Diagnosis not present

## 2015-05-01 DIAGNOSIS — C50912 Malignant neoplasm of unspecified site of left female breast: Secondary | ICD-10-CM

## 2015-05-01 DIAGNOSIS — Z5111 Encounter for antineoplastic chemotherapy: Secondary | ICD-10-CM | POA: Diagnosis present

## 2015-05-01 DIAGNOSIS — C787 Secondary malignant neoplasm of liver and intrahepatic bile duct: Secondary | ICD-10-CM | POA: Diagnosis not present

## 2015-05-01 DIAGNOSIS — C8383 Other non-follicular lymphoma, intra-abdominal lymph nodes: Secondary | ICD-10-CM

## 2015-05-01 DIAGNOSIS — T451X5A Adverse effect of antineoplastic and immunosuppressive drugs, initial encounter: Secondary | ICD-10-CM

## 2015-05-01 DIAGNOSIS — C9110 Chronic lymphocytic leukemia of B-cell type not having achieved remission: Secondary | ICD-10-CM | POA: Diagnosis not present

## 2015-05-01 DIAGNOSIS — C7951 Secondary malignant neoplasm of bone: Secondary | ICD-10-CM

## 2015-05-01 DIAGNOSIS — Z95828 Presence of other vascular implants and grafts: Secondary | ICD-10-CM

## 2015-05-01 DIAGNOSIS — D701 Agranulocytosis secondary to cancer chemotherapy: Secondary | ICD-10-CM

## 2015-05-01 DIAGNOSIS — C8590 Non-Hodgkin lymphoma, unspecified, unspecified site: Secondary | ICD-10-CM

## 2015-05-01 DIAGNOSIS — C8300 Small cell B-cell lymphoma, unspecified site: Secondary | ICD-10-CM

## 2015-05-01 LAB — CBC WITH DIFFERENTIAL/PLATELET
BASO%: 0.9 % (ref 0.0–2.0)
BASOS ABS: 0 10*3/uL (ref 0.0–0.1)
EOS ABS: 0.1 10*3/uL (ref 0.0–0.5)
EOS%: 3.6 % (ref 0.0–7.0)
HEMATOCRIT: 27 % — AB (ref 34.8–46.6)
HGB: 8.7 g/dL — ABNORMAL LOW (ref 11.6–15.9)
LYMPH%: 7.1 % — AB (ref 14.0–49.7)
MCH: 26.1 pg (ref 25.1–34.0)
MCHC: 32.2 g/dL (ref 31.5–36.0)
MCV: 80.9 fL (ref 79.5–101.0)
MONO#: 1.2 10*3/uL — ABNORMAL HIGH (ref 0.1–0.9)
MONO%: 33.1 % — ABNORMAL HIGH (ref 0.0–14.0)
NEUT%: 55.3 % (ref 38.4–76.8)
NEUTROS ABS: 1.9 10*3/uL (ref 1.5–6.5)
Platelets: 304 10*3/uL (ref 145–400)
RBC: 3.34 10*6/uL — ABNORMAL LOW (ref 3.70–5.45)
RDW: 18.9 % — ABNORMAL HIGH (ref 11.2–14.5)
WBC: 3.5 10*3/uL — ABNORMAL LOW (ref 3.9–10.3)
lymph#: 0.2 10*3/uL — ABNORMAL LOW (ref 0.9–3.3)

## 2015-05-01 LAB — COMPREHENSIVE METABOLIC PANEL (CC13)
ALBUMIN: 2.8 g/dL — AB (ref 3.5–5.0)
ALT: 41 U/L (ref 0–55)
ANION GAP: 10 meq/L (ref 3–11)
AST: 28 U/L (ref 5–34)
Alkaline Phosphatase: 82 U/L (ref 40–150)
BUN: 11.2 mg/dL (ref 7.0–26.0)
CALCIUM: 9.6 mg/dL (ref 8.4–10.4)
CHLORIDE: 101 meq/L (ref 98–109)
CO2: 28 meq/L (ref 22–29)
Creatinine: 0.8 mg/dL (ref 0.6–1.1)
EGFR: 83 mL/min/{1.73_m2} — ABNORMAL LOW (ref >90)
GLUCOSE: 128 mg/dL (ref 70–140)
Potassium: 4.1 mEq/L (ref 3.5–5.1)
Sodium: 138 mEq/L (ref 136–145)
TOTAL PROTEIN: 6.4 g/dL (ref 6.4–8.3)
Total Bilirubin: 0.34 mg/dL (ref 0.20–1.20)

## 2015-05-01 LAB — HOLD TUBE, BLOOD BANK

## 2015-05-01 MED ORDER — HEPARIN SOD (PORK) LOCK FLUSH 100 UNIT/ML IV SOLN
500.0000 [IU] | Freq: Once | INTRAVENOUS | Status: AC | PRN
  Administered 2015-05-01: 500 [IU]
  Filled 2015-05-01: qty 5

## 2015-05-01 MED ORDER — SODIUM CHLORIDE 0.9 % IV SOLN
600.0000 mg/m2 | Freq: Once | INTRAVENOUS | Status: AC
  Administered 2015-05-01: 988 mg via INTRAVENOUS
  Filled 2015-05-01: qty 25.98

## 2015-05-01 MED ORDER — SODIUM CHLORIDE 0.9 % IV SOLN
Freq: Once | INTRAVENOUS | Status: AC
  Administered 2015-05-01: 11:00:00 via INTRAVENOUS

## 2015-05-01 MED ORDER — PROCHLORPERAZINE MALEATE 10 MG PO TABS
ORAL_TABLET | ORAL | Status: AC
  Filled 2015-05-01: qty 1

## 2015-05-01 MED ORDER — SODIUM CHLORIDE 0.9 % IJ SOLN
10.0000 mL | INTRAMUSCULAR | Status: DC | PRN
  Administered 2015-05-01: 10 mL
  Filled 2015-05-01: qty 10

## 2015-05-01 MED ORDER — PROCHLORPERAZINE MALEATE 10 MG PO TABS
10.0000 mg | ORAL_TABLET | Freq: Once | ORAL | Status: AC
  Administered 2015-05-01: 10 mg via ORAL

## 2015-05-01 MED ORDER — SODIUM CHLORIDE 0.9 % IJ SOLN
10.0000 mL | INTRAMUSCULAR | Status: DC | PRN
  Administered 2015-05-01: 10 mL via INTRAVENOUS
  Filled 2015-05-01: qty 10

## 2015-05-01 NOTE — Telephone Encounter (Signed)
Gave and printed appt sched and avs for pt for July  °

## 2015-05-01 NOTE — Patient Instructions (Signed)
[facility] Discharge Instructions for Patients Receiving Chemotherapy  Today you received the following chemotherapy agents Gemzar  To help prevent nausea and vomiting after your treatment, we encourage you to take your nausea medication as directed.    If you develop nausea and vomiting that is not controlled by your nausea medication, call the clinic.   BELOW ARE SYMPTOMS THAT SHOULD BE REPORTED IMMEDIATELY:  *FEVER GREATER THAN 100.5 F  *CHILLS WITH OR WITHOUT FEVER  NAUSEA AND VOMITING THAT IS NOT CONTROLLED WITH YOUR NAUSEA MEDICATION  *UNUSUAL SHORTNESS OF BREATH  *UNUSUAL BRUISING OR BLEEDING  TENDERNESS IN MOUTH AND THROAT WITH OR WITHOUT PRESENCE OF ULCERS  *URINARY PROBLEMS  *BOWEL PROBLEMS  UNUSUAL RASH Items with * indicate a potential emergency and should be followed up as soon as possible.  Feel free to call the clinic you have any questions or concerns. The clinic phone number is [phone].  Please show the CHEMO ALERT CARD at check-in to the Emergency Department and triage nurse.   

## 2015-05-01 NOTE — Patient Instructions (Signed)

## 2015-05-02 NOTE — Assessment & Plan Note (Signed)
This is likely due to recent treatment. The patient denies recent history of bleeding such as epistaxis, hematuria or hematochezia. She is asymptomatic from the anemia. I will observe for now.   

## 2015-05-02 NOTE — Assessment & Plan Note (Signed)
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 She had recent dental extraction. She will continue to receive Xgeva. I recommend high-dose vitamin D and calcium supplement.

## 2015-05-02 NOTE — Assessment & Plan Note (Signed)
She was started with Gemzar and tolerated treatment well. I will continue the same dose. Even though she has very mild pancytopenia, I suspect the cause of the pancytopenia is more related to Ibrutinib. CT scan show stable disease in the liver. I will continue same treatment and restage again in 3-4 months, next imaging study would be August 2016.

## 2015-05-02 NOTE — Assessment & Plan Note (Signed)
Previously, she has excellent response to treatment. However, on examination, I detected a new lymphadenopathy in the right supraclavicular region which was not there in her last visit. I will watch this carefully. If this continues to grow, it might prove that she is resistant to Ibrutinib and I might have to scan her sooner and consider switching treatment.

## 2015-05-02 NOTE — Progress Notes (Signed)
[place] OFFICE PROGRESS NOTE  Patient Care Team: No Pcp Per Patient as PCP - General (General Practice) [name], MD as Consulting Physician (Hematology and Oncology)  SUMMARY OF ONCOLOGIC HISTORY:   Cancer of left breast   08/03/2008 Initial Diagnosis Breast CA   11/06/2014 Imaging Repeat CT scan of the chest, abdomen and pelvis show regression in the size of liver metastasis and pulmonary metastasis with mild progression of lymphadenopathy   11/06/2014 Tumor Marker CA-27-29 is elevated at 275   01/18/2015 Imaging MRI head is negative for metastatic disease   01/18/2015 Imaging CT scan of the chest, abdomen and pelvis show significant disease progression throughout.   01/25/2015 -  Chemotherapy She started Gemzar every other week and Ibrutinib   02/20/2015 Tumor Marker CA 27-29 at 246   03/30/2015 Imaging Repeat CT scan of the chest, abdomen and pelvis show positive response to treatment for lymphoma. Liver lesions are stable.    INTERVAL HISTORY: Please see below for problem oriented charting. She returns for further follow-up. She complained with mild fatigue and mild joint pain. She fell new lump in the right side of the neck. Denies new lymphadenopathy elsewhere. Denies excessive bruising or diarrhea recently.  REVIEW OF SYSTEMS:   Constitutional: Denies fevers, chills or abnormal weight loss Eyes: Denies blurriness of vision Ears, nose, mouth, throat, and face: Denies mucositis or sore throat Respiratory: Denies cough, dyspnea or wheezes Cardiovascular: Denies palpitation, chest discomfort or lower extremity swelling Gastrointestinal:  Denies nausea, heartburn or change in bowel habits Skin: Denies abnormal skin rashes Neurological:Denies numbness, tingling or new weaknesses Behavioral/Psych: Mood is stable, no new changes  All other systems were reviewed with the patient and are negative.  I have reviewed the past medical history, past surgical history, social  history and family history with the patient and they are unchanged from previous note.  ALLERGIES:  is allergic to codeine; penicillins; sulfonamide derivatives; tape; and tramadol hcl.  MEDICATIONS:  Current Outpatient Prescriptions  Medication Sig Dispense Refill  . HYDROcodone-acetaminophen (NORCO) 5-325 MG per tablet Take 1 tablet by mouth every 6 (six) hours as needed for moderate pain. 60 tablet 0  . ibrutinib (IMBRUVICA) 140 MG capsul Take 2 capsules (280 mg total) by mouth daily. 90 capsule 0  . lidocaine-prilocaine (EMLA) cream Apply 1 application topically as needed. 30 g 6  . potassium chloride SA (K-DUR,KLOR-CON) 20 MEQ tablet Take 1 tablet (20 mEq total) by mouth 2 (two) times daily. 14 tablet 0   No current facility-administered medications for this visit.    PHYSICAL EXAMINATION: ECOG PERFORMANCE STATUS: 1 - Symptomatic but completely ambulatory  Filed Vitals:   05/01/15 1013  BP: 142/54  Pulse: 98  Temp: 98 F (36.7 C)  Resp: 18   Filed Weights   05/01/15 1013  Weight: 127 lb 11.2 oz (57.924 kg)    GENERAL:alert, no distress and comfortable SKIN: skin color, texture, turgor are normal, no rashes or significant lesions EYES: normal, Conjunctiva are pink and non-injected, sclera clear OROPHARYNX:no exudate, no erythema and lips, buccal mucosa, and tongue normal  NECK: supple, thyroid normal size, non-tender, without nodularity LYMPH:  She has palpable lymphadenopathy on the right side of the neck.  LUNGS: clear to auscultation and percussion with normal breathing effort HEART: regular rate & rhythm and no murmurs and no lower extremity edema ABDOMEN:abdomen soft, non-tender and normal bowel sounds Musculoskeletal:no cyanosis of digits and no clubbing  NEURO: alert & oriented x 3 with fluent speech, no  focal motor/sensory deficits  LABORATORY DATA:  I have reviewed the data as listed    Component Value Date/Time   NA 138 05/01/2015 0953   NA 139 01/13/2015  1730   NA 142 06/01/2012 1008   K 4.1 05/01/2015 0953   K 4.0 01/13/2015 1730   K 3.8 06/01/2012 1008   CL 103 01/13/2015 1730   CL 107 04/22/2013 0922   CL 103 06/01/2012 1008   CO2 28 05/01/2015 0953   CO2 25 06/13/2014 0819   CO2 25 06/01/2012 1008   GLUCOSE 128 05/01/2015 0953   GLUCOSE 93 01/13/2015 1730   GLUCOSE 89 04/22/2013 0922   GLUCOSE 108 06/01/2012 1008   BUN 11.2 05/01/2015 0953   BUN 12 01/13/2015 1730   BUN 9 06/01/2012 1008   CREATININE 0.8 05/01/2015 0953   CREATININE 0.80 01/13/2015 1730   CREATININE 0.9 06/01/2012 1008   CALCIUM 9.6 05/01/2015 0953   CALCIUM 10.8* 06/13/2014 0819   CALCIUM 9.8 06/01/2012 1008   PROT 6.4 05/01/2015 0953   PROT 7.5 06/13/2014 0819   PROT 8.1 08/14/2010 1251   ALBUMIN 2.8* 05/01/2015 0953   ALBUMIN 3.9 06/13/2014 0819   AST 28 05/01/2015 0953   AST 23 06/13/2014 0819   AST 19 08/14/2010 1251   ALT 41 05/01/2015 0953   ALT 13 06/13/2014 0819   ALT 9* 08/14/2010 1251   ALKPHOS 82 05/01/2015 0953   ALKPHOS 89 06/13/2014 0819   ALKPHOS 78 08/14/2010 1251   BILITOT 0.34 05/01/2015 0953   BILITOT 0.2* 06/13/2014 0819   BILITOT 0.40 08/14/2010 1251   GFRNONAA 87* 07/06/2012 1122   GFRAA >90 07/06/2012 1122    No results found for: SPEP, UPEP  Lab Results  Component Value Date   WBC 3.5* 05/01/2015   NEUTROABS 1.9 05/01/2015   HGB 8.7* 05/01/2015   HCT 27.0* 05/01/2015   MCV 80.9 05/01/2015   PLT 304 05/01/2015      Chemistry      Component Value Date/Time   NA 138 05/01/2015 0953   NA 139 01/13/2015 1730   NA 142 06/01/2012 1008   K 4.1 05/01/2015 0953   K 4.0 01/13/2015 1730   K 3.8 06/01/2012 1008   CL 103 01/13/2015 1730   CL 107 04/22/2013 0922   CL 103 06/01/2012 1008   CO2 28 05/01/2015 0953   CO2 25 06/13/2014 0819   CO2 25 06/01/2012 1008   BUN 11.2 05/01/2015 0953   BUN 12 01/13/2015 1730   BUN 9 06/01/2012 1008   CREATININE 0.8 05/01/2015 0953   CREATININE 0.80 01/13/2015 1730    CREATININE 0.9 06/01/2012 1008      Component Value Date/Time   CALCIUM 9.6 05/01/2015 0953   CALCIUM 10.8* 06/13/2014 0819   CALCIUM 9.8 06/01/2012 1008   ALKPHOS 82 05/01/2015 0953   ALKPHOS 89 06/13/2014 0819   ALKPHOS 78 08/14/2010 1251   AST 28 05/01/2015 0953   AST 23 06/13/2014 0819   AST 19 08/14/2010 1251   ALT 41 05/01/2015 0953   ALT 13 06/13/2014 0819   ALT 9* 08/14/2010 1251   BILITOT 0.34 05/01/2015 0953   BILITOT 0.2* 06/13/2014 0819   BILITOT 0.40 08/14/2010 1251     ASSESSMENT & PLAN:  Cancer of left breast She was started with Gemzar and tolerated treatment well. I will continue the same dose. Even though she has very mild pancytopenia, I suspect the cause of the pancytopenia is more related to Ibrutinib. CT scan  show stable disease in the liver. I will continue same treatment and restage again in 3-4 months, next imaging study would be August 2016.    Lymphoma, small lymphocytic Previously, she has excellent response to treatment. However, on examination, I detected a new lymphadenopathy in the right supraclavicular region which was not there in her last visit. I will watch this carefully. If this continues to grow, it might prove that she is resistant to Ibrutinib and I might have to scan her sooner and consider switching treatment.  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 She had recent dental extraction. She will continue to receive Xgeva. I recommend high-dose vitamin D and calcium supplement.   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 The patient denies recent history of bleeding such as epistaxis, hematuria or hematochezia. She is asymptomatic from the anemia. I will observe for now.    Leukopenia due to antineoplastic chemotherapy This is likely due to recent treatment. The patient denies recent history of bleeding such as epistaxis, hematuria or hematochezia. She is  asymptomatic from the low platelet count. I will observe for now.      Orders Placed This Encounter  Procedures  . Cancer antigen 27.29    Standing Status: Standing     Number of Occurrences: 9     Standing Expiration Date: 04/30/2016  . Hold Tube, Blood Bank    Standing Status: Standing     Number of Occurrences: 9     Standing Expiration Date: 04/30/2016   All questions were answered. The patient knows to call the clinic with any problems, questions or concerns. No barriers to learning was detected. I spent 30 minutes counseling the patient face to face. The total time spent in the appointment was 40 minutes and more than 50% was on counseling and review of test results     [facility], [place], MD 05/02/2015 3:48 PM

## 2015-05-02 NOTE — Assessment & Plan Note (Signed)
This is likely due to recent treatment. The patient denies recent history of bleeding such as epistaxis, hematuria or hematochezia. She is asymptomatic from the low platelet count. I will observe for now.  

## 2015-05-09 ENCOUNTER — Ambulatory Visit
Admission: RE | Admit: 2015-05-09 | Discharge: 2015-05-09 | Disposition: A | Discharge status: Home / Self Care | Payer: Medicare Other | Source: Ambulatory Visit | Attending: Radiation Oncology | Admitting: Radiation Oncology

## 2015-05-09 ENCOUNTER — Encounter: Payer: Self-pay | Admitting: Radiation Oncology

## 2015-05-09 DIAGNOSIS — C8300 Small cell B-cell lymphoma, unspecified site: Secondary | ICD-10-CM

## 2015-05-09 DIAGNOSIS — C7951 Secondary malignant neoplasm of bone: Secondary | ICD-10-CM | POA: Diagnosis not present

## 2015-05-09 DIAGNOSIS — C787 Secondary malignant neoplasm of liver and intrahepatic bile duct: Secondary | ICD-10-CM | POA: Diagnosis not present

## 2015-05-09 DIAGNOSIS — Z923 Personal history of irradiation: Secondary | ICD-10-CM | POA: Insufficient documentation

## 2015-05-09 DIAGNOSIS — C770 Secondary and unspecified malignant neoplasm of lymph nodes of head, face and neck: Secondary | ICD-10-CM | POA: Insufficient documentation

## 2015-05-09 DIAGNOSIS — C50912 Malignant neoplasm of unspecified site of left female breast: Secondary | ICD-10-CM | POA: Insufficient documentation

## 2015-05-09 DIAGNOSIS — C7800 Secondary malignant neoplasm of unspecified lung: Secondary | ICD-10-CM | POA: Diagnosis not present

## 2015-05-09 NOTE — Progress Notes (Addendum)
Recon Lymphoma with Mets New lump right side of neck  Dr. Lindi Adie note 05/02/15: Lymphoma, small lymphocytic Previously, she has excellent response to treatment. However, on examination, I detected a new lymphadenopathy in the right supraclavicular region which was not there in her last visit. I will watch this carefully. If this continues to grow, it might prove that she is resistant to Ibrutinib and I might have to scan her sooner and consider switching treatment.  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 She had recent dental extraction. She will continue to receive Xgeva. I recommend high-dose vitamin D and calcium supplement. Pain, right side neck where mass is and right  Ride near [place] ,is sore stated patient. Difficulty swallowing at times, difficulty lifting with right arm very fatigued, lives alone  History of Radiation treatments: L-4,left shoulder 07/19/12-07/30/12 Radiation treatments to Left breast =09/11/2008-10/31/2008 BP 154/58 mmHg  Pulse 90  Temp(Src) 98.4 F (36.9 C) (Oral)  Resp 20  Ht 5\' 2"  (1.575 m)  Wt 124 lb 9.6 oz (56.518 kg)  BMI 22.78 kg/m2  SpO2 96%  Wt Readings from Last 3 Encounters:  05/09/15 124 lb 9.6 oz (56.518 kg)  05/01/15 127 lb 11.2 oz (57.924 kg)  04/03/15 127 lb (57.607 kg)   2:25 PM

## 2015-05-09 NOTE — Progress Notes (Signed)
Please see the Nurse Progress Note in the MD Initial Consult Encounter for this patient. 

## 2015-05-11 ENCOUNTER — Encounter: Payer: Self-pay | Admitting: Radiation Oncology

## 2015-05-11 NOTE — Progress Notes (Signed)
Radiation Oncology         [phone] ________________________________  Name: Helen [name] MRN: [id number]  Date: 05/09/2015  DOB: September 18, 1935  Follow-Up Visit Note  CC: No PCP Per Patient  [name], MD  Diagnosis:     Cancer of left breast   Staging form: Breast, AJCC 6th Edition     Clinical: Stage IV (T4d, NX, M1) - Signed by [name], MD on 02/23/2014 Lymphoma, small lymphocytic   Staging form: Lymphoid Neoplasms, AJCC 6th Edition     Clinical: Stage IV - Signed by [name], MD on 02/23/2014    Cancer of left breast   08/03/2008 Initial Diagnosis Breast CA   11/06/2014 Imaging Repeat CT scan of the chest, abdomen and pelvis show regression in the size of liver metastasis and pulmonary metastasis with mild progression of lymphadenopathy   11/06/2014 Tumor Marker CA-27-29 is elevated at 275   01/18/2015 Imaging MRI head is negative for metastatic disease   01/18/2015 Imaging CT scan of the chest, abdomen and pelvis show significant disease progression throughout.   01/25/2015 -  Chemotherapy She started Gemzar every other week and Ibrutinib   02/20/2015 Tumor Marker CA 27-29 at 246   03/30/2015 Imaging Repeat CT scan of the chest, abdomen and pelvis show positive response to treatment for lymphoma. Liver lesions are stable.    Narrative:  The patient returns today for re-evaluation. She states today that she feels relatively well. Her main issue is the development of a mass in the lower right neck due to supraclavicular lymphadenopathy. She states this has been growing in recent weeks, but is not currently causing pain. She denies any dysphagis. No complaints of pain elsewhere.            ALLERGIES:  is allergic to codeine; penicillins; sulfonamide derivatives; tape; and tramadol hcl.  Meds: Current Outpatient Prescriptions  Medication Sig Dispense Refill  . HYDROcodone-acetaminophen (NORCO) 5-325 MG per tablet Take 1 tablet by mouth every 6 (six) hours as needed for moderate  pain. 60 tablet 0  . ibrutinib (IMBRUVICA) 140 MG capsul Take 2 capsules (280 mg total) by mouth daily. 90 capsule 0  . lidocaine-prilocaine (EMLA) cream Apply 1 application topically as needed. 30 g 6  . potassium chloride SA (K-DUR,KLOR-CON) 20 MEQ tablet Take 1 tablet (20 mEq total) by mouth 2 (two) times daily. 14 tablet 0   No current facility-administered medications for this encounter.    Physical Findings: The patient is in no acute distress. Patient is alert and oriented.  height is 5\' 2"  (1.575 m) and weight is 124 lb 9.6 oz (56.518 kg). Her oral temperature is 98.4 F (36.9 C). Her blood pressure is 154/58 and her pulse is 90. Her respiration is 20 and oxygen saturation is 96%. .   Visible, moderately bulky right supraclavicular lymphadenopathy present. No skin involvement.  Lab Findings: Lab Results  Component Value Date   WBC 3.5* 05/01/2015   HGB 8.7* 05/01/2015   HCT 27.0* 05/01/2015   MCV 80.9 05/01/2015   PLT 304 05/01/2015     Radiographic Findings: No results found.  Impression:     The patient clinically appears to be doing fairly well overall. We discussed today possible palliative radiation treatment to the right neck for enlarging lymphadenopathy there. We discussed a possible 2-3 week course of treatment.   She previously had a nice response to radiation, but she wishes to further consider radiation. She indicated she would like to discuss possible palliative radiation  with Dr. Alvy Bimler as well, and she is hoping to do this next week. She will let us know next week what her final decision.  Plan:  We will await her decision regarding possible radiation.  The patient was seen today for 25 minutes, with the majority of the time spent counseling the patient on his diagnosis of cancer and coordinating his care.   Jodelle Gross, M.D., Ph.D.

## 2015-05-15 ENCOUNTER — Ambulatory Visit (HOSPITAL_BASED_OUTPATIENT_CLINIC_OR_DEPARTMENT_OTHER): Payer: Medicare Other | Admitting: Hematology and Oncology

## 2015-05-15 ENCOUNTER — Other Ambulatory Visit: Payer: Self-pay | Admitting: Hematology and Oncology

## 2015-05-15 ENCOUNTER — Ambulatory Visit: Payer: Medicare Other

## 2015-05-15 ENCOUNTER — Other Ambulatory Visit (HOSPITAL_BASED_OUTPATIENT_CLINIC_OR_DEPARTMENT_OTHER): Payer: Medicare Other

## 2015-05-15 ENCOUNTER — Telehealth: Payer: Self-pay | Admitting: Hematology and Oncology

## 2015-05-15 ENCOUNTER — Ambulatory Visit (HOSPITAL_BASED_OUTPATIENT_CLINIC_OR_DEPARTMENT_OTHER): Payer: Medicare Other

## 2015-05-15 ENCOUNTER — Ambulatory Visit (HOSPITAL_COMMUNITY)
Admission: RE | Admit: 2015-05-15 | Discharge: 2015-05-15 | Disposition: A | Discharge status: Home / Self Care | Payer: Medicare Other | Source: Ambulatory Visit | Attending: Hematology and Oncology | Admitting: Hematology and Oncology

## 2015-05-15 VITALS — BP 143/59 | HR 83 | Temp 97.8°F | Resp 18 | Ht 62.0 in | Wt 125.9 lb

## 2015-05-15 VITALS — BP 142/58 | HR 89 | Temp 98.0°F | Resp 20

## 2015-05-15 DIAGNOSIS — C7951 Secondary malignant neoplasm of bone: Secondary | ICD-10-CM

## 2015-05-15 DIAGNOSIS — D630 Anemia in neoplastic disease: Secondary | ICD-10-CM | POA: Diagnosis not present

## 2015-05-15 DIAGNOSIS — C8300 Small cell B-cell lymphoma, unspecified site: Secondary | ICD-10-CM | POA: Diagnosis not present

## 2015-05-15 DIAGNOSIS — D61811 Other drug-induced pancytopenia: Secondary | ICD-10-CM

## 2015-05-15 DIAGNOSIS — Z5111 Encounter for antineoplastic chemotherapy: Secondary | ICD-10-CM | POA: Diagnosis not present

## 2015-05-15 DIAGNOSIS — C8590 Non-Hodgkin lymphoma, unspecified, unspecified site: Secondary | ICD-10-CM

## 2015-05-15 DIAGNOSIS — C50912 Malignant neoplasm of unspecified site of left female breast: Secondary | ICD-10-CM | POA: Diagnosis present

## 2015-05-15 DIAGNOSIS — Z95828 Presence of other vascular implants and grafts: Secondary | ICD-10-CM

## 2015-05-15 LAB — CBC WITH DIFFERENTIAL/PLATELET
BASO%: 1.5 % (ref 0.0–2.0)
Basophils Absolute: 0.1 10*3/uL (ref 0.0–0.1)
EOS%: 3 % (ref 0.0–7.0)
Eosinophils Absolute: 0.1 10*3/uL (ref 0.0–0.5)
HEMATOCRIT: 24.7 % — AB (ref 34.8–46.6)
HGB: 7.9 g/dL — ABNORMAL LOW (ref 11.6–15.9)
LYMPH#: 0.3 10*3/uL — AB (ref 0.9–3.3)
LYMPH%: 9.6 % — ABNORMAL LOW (ref 14.0–49.7)
MCH: 25.4 pg (ref 25.1–34.0)
MCHC: 31.8 g/dL (ref 31.5–36.0)
MCV: 79.7 fL (ref 79.5–101.0)
MONO#: 1.1 10*3/uL — ABNORMAL HIGH (ref 0.1–0.9)
MONO%: 31.6 % — ABNORMAL HIGH (ref 0.0–14.0)
NEUT#: 1.9 10*3/uL (ref 1.5–6.5)
NEUT%: 54.3 % (ref 38.4–76.8)
Platelets: 330 10*3/uL (ref 145–400)
RBC: 3.1 10*6/uL — AB (ref 3.70–5.45)
RDW: 19.1 % — ABNORMAL HIGH (ref 11.2–14.5)
WBC: 3.4 10*3/uL — ABNORMAL LOW (ref 3.9–10.3)

## 2015-05-15 LAB — COMPREHENSIVE METABOLIC PANEL (CC13)
ALT: 30 U/L (ref 0–55)
ANION GAP: 9 meq/L (ref 3–11)
AST: 21 U/L (ref 5–34)
Albumin: 2.8 g/dL — ABNORMAL LOW (ref 3.5–5.0)
Alkaline Phosphatase: 81 U/L (ref 40–150)
BUN: 8.4 mg/dL (ref 7.0–26.0)
CALCIUM: 9.3 mg/dL (ref 8.4–10.4)
CHLORIDE: 106 meq/L (ref 98–109)
CO2: 25 mEq/L (ref 22–29)
Creatinine: 0.7 mg/dL (ref 0.6–1.1)
EGFR: >90 mL/min/{1.73_m2} (ref >90)
GLUCOSE: 81 mg/dL (ref 70–140)
Potassium: 3.7 mEq/L (ref 3.5–5.1)
Sodium: 139 mEq/L (ref 136–145)
Total Bilirubin: 0.3 mg/dL (ref 0.20–1.20)
Total Protein: 6.2 g/dL — ABNORMAL LOW (ref 6.4–8.3)

## 2015-05-15 LAB — PREPARE RBC (CROSSMATCH)

## 2015-05-15 LAB — HOLD TUBE, BLOOD BANK

## 2015-05-15 MED ORDER — SODIUM CHLORIDE 0.9 % IV SOLN
600.0000 mg/m2 | Freq: Once | INTRAVENOUS | Status: AC
  Administered 2015-05-15: 988 mg via INTRAVENOUS
  Filled 2015-05-15: qty 25.98

## 2015-05-15 MED ORDER — DENOSUMAB 120 MG/1.7ML ~~LOC~~ SOLN
120.0000 mg | Freq: Once | SUBCUTANEOUS | Status: AC
  Administered 2015-05-15: 120 mg via SUBCUTANEOUS
  Filled 2015-05-15: qty 1.7

## 2015-05-15 MED ORDER — HEPARIN SOD (PORK) LOCK FLUSH 100 UNIT/ML IV SOLN
250.0000 [IU] | INTRAVENOUS | Status: DC | PRN
  Filled 2015-05-15: qty 5

## 2015-05-15 MED ORDER — PROCHLORPERAZINE MALEATE 10 MG PO TABS
ORAL_TABLET | ORAL | Status: AC
  Filled 2015-05-15: qty 1

## 2015-05-15 MED ORDER — PROCHLORPERAZINE MALEATE 10 MG PO TABS
10.0000 mg | ORAL_TABLET | Freq: Once | ORAL | Status: AC
  Administered 2015-05-15: 10 mg via ORAL

## 2015-05-15 MED ORDER — SODIUM CHLORIDE 0.9 % IJ SOLN
10.0000 mL | INTRAMUSCULAR | Status: AC | PRN
  Filled 2015-05-15: qty 10

## 2015-05-15 MED ORDER — SODIUM CHLORIDE 0.9 % IJ SOLN
10.0000 mL | INTRAMUSCULAR | Status: DC | PRN
  Administered 2015-05-15: 10 mL via INTRAVENOUS
  Filled 2015-05-15: qty 10

## 2015-05-15 MED ORDER — HEPARIN SOD (PORK) LOCK FLUSH 100 UNIT/ML IV SOLN
500.0000 [IU] | Freq: Once | INTRAVENOUS | Status: AC | PRN
  Administered 2015-05-15: 500 [IU]
  Filled 2015-05-15: qty 5

## 2015-05-15 MED ORDER — SODIUM CHLORIDE 0.9 % IJ SOLN
3.0000 mL | INTRAMUSCULAR | Status: DC | PRN
  Filled 2015-05-15: qty 10

## 2015-05-15 MED ORDER — SODIUM CHLORIDE 0.9 % IV SOLN
250.0000 mL | Freq: Once | INTRAVENOUS | Status: AC
  Administered 2015-05-15: 250 mL via INTRAVENOUS

## 2015-05-15 MED ORDER — SODIUM CHLORIDE 0.9 % IJ SOLN
10.0000 mL | INTRAMUSCULAR | Status: DC | PRN
  Administered 2015-05-15: 10 mL
  Filled 2015-05-15: qty 10

## 2015-05-15 MED ORDER — SODIUM CHLORIDE 0.9 % IV SOLN
Freq: Once | INTRAVENOUS | Status: AC
  Administered 2015-05-15: 12:00:00 via INTRAVENOUS

## 2015-05-15 MED ORDER — HEPARIN SOD (PORK) LOCK FLUSH 100 UNIT/ML IV SOLN
500.0000 [IU] | Freq: Every day | INTRAVENOUS | Status: AC | PRN
  Filled 2015-05-15: qty 5

## 2015-05-15 NOTE — Progress Notes (Signed)
[place] OFFICE PROGRESS NOTE  Patient Care Team: No Pcp Per Patient as PCP - General (General Practice) [name], MD as Consulting Physician (Hematology and Oncology)  SUMMARY OF ONCOLOGIC HISTORY:   Cancer of left breast   08/03/2008 Initial Diagnosis Breast CA   11/06/2014 Imaging Repeat CT scan of the chest, abdomen and pelvis show regression in the size of liver metastasis and pulmonary metastasis with mild progression of lymphadenopathy   11/06/2014 Tumor Marker CA-27-29 is elevated at 275   01/18/2015 Imaging MRI head is negative for metastatic disease   01/18/2015 Imaging CT scan of the chest, abdomen and pelvis show significant disease progression throughout.   01/25/2015 -  Chemotherapy She started Gemzar every other week and Ibrutinib   02/20/2015 Tumor Marker CA 27-29 at 246   03/30/2015 Imaging Repeat CT scan of the chest, abdomen and pelvis show positive response to treatment for lymphoma. Liver lesions are stable.    INTERVAL HISTORY: Please see below for problem oriented charting. She is seen today for further follow-up. She reduce the dose of her chemotherapy pill because is making her feel unwell. She complained of nasal drainage and some trouble swallowing. She denies significant pain in her neck. No recent nausea or vomiting.  REVIEW OF SYSTEMS:   Constitutional: Denies fevers, chills or abnormal weight loss Eyes: Denies blurriness of vision Ears, nose, mouth, throat, and face: Denies mucositis or sore throat Respiratory: Denies cough, dyspnea or wheezes Cardiovascular: Denies palpitation, chest discomfort or lower extremity swelling Gastrointestinal:  Denies nausea, heartburn or change in bowel habits Skin: Denies abnormal skin rashes Neurological:Denies numbness, tingling or new weaknesses Behavioral/Psych: Mood is stable, no new changes  All other systems were reviewed with the patient and are negative.  I have reviewed the past medical history,  past surgical history, social history and family history with the patient and they are unchanged from previous note.  ALLERGIES:  is allergic to codeine; penicillins; sulfonamide derivatives; tape; and tramadol hcl.  MEDICATIONS:  Current Outpatient Prescriptions  Medication Sig Dispense Refill  . HYDROcodone-acetaminophen (NORCO) 5-325 MG per tablet Take 1 tablet by mouth every 6 (six) hours as needed for moderate pain. 60 tablet 0  . ibrutinib (IMBRUVICA) 140 MG capsul Take 2 capsules (280 mg total) by mouth daily. 90 capsule 0  . lidocaine-prilocaine (EMLA) cream Apply 1 application topically as needed. (Patient not taking: Reported on 05/15/2015) 30 g 6   No current facility-administered medications for this visit.   Facility-Administered Medications Ordered in Other Visits  Medication Dose Route Frequency Provider Last Rate Last Dose  . 0.9 %  sodium chloride infusion  250 mL Intravenous Once [name], MD      . gemcitabine (GEMZAR) 988 mg in sodium chloride 0.9 % 100 mL chemo infusion  600 mg/m2 (Treatment Plan Actual) Intravenous Once [name], MD 252 mL/hr at 05/15/15 1253 988 mg at 05/15/15 1253  . heparin lock flush 100 unit/mL  500 Units Intracatheter Once PRN [name], MD      . heparin lock flush 100 unit/mL  250 Units Intracatheter PRN [name], MD   250 Units at 05/15/15 1206  . sodium chloride 0.9 % injection 10 mL  10 mL Intracatheter PRN [name], MD      . sodium chloride 0.9 % injection 3 mL  3 mL Intracatheter PRN [name], MD   3 mL at 05/15/15 1206    PHYSICAL EXAMINATION: ECOG PERFORMANCE STATUS: 1 - Symptomatic but completely  ambulatory  Filed Vitals:   05/15/15 1116  BP: 143/59  Pulse: 83  Temp: 97.8 F (36.6 C)  Resp: 18   Filed Weights   05/15/15 1116  Weight: 125 lb 14.4 oz (57.108 kg)    GENERAL:alert, no distress and comfortable SKIN: skin color, texture, turgor are normal, no rashes or significant lesions EYES: normal, Conjunctiva  are pale and non-injected, sclera clear OROPHARYNX:no exudate, no erythema and lips, buccal mucosa, and tongue normal  NECK: supple, thyroid normal size, non-tender, without nodularity LYMPH:  Significant palpable lymphadenopathy in the right neck/supraclavicular region, worse compared to prior visit  LUNGS: clear to auscultation and percussion with normal breathing effort HEART: regular rate & rhythm and no murmurs and no lower extremity edema ABDOMEN:abdomen soft, non-tender and normal bowel sounds Musculoskeletal:no cyanosis of digits and no clubbing  NEURO: alert & oriented x 3 with fluent speech, no focal motor/sensory deficits  LABORATORY DATA:  I have reviewed the data as listed    Component Value Date/Time   NA 139 05/15/2015 1051   NA 139 01/13/2015 1730   NA 142 06/01/2012 1008   K 3.7 05/15/2015 1051   K 4.0 01/13/2015 1730   K 3.8 06/01/2012 1008   CL 103 01/13/2015 1730   CL 107 04/22/2013 0922   CL 103 06/01/2012 1008   CO2 25 05/15/2015 1051   CO2 25 06/13/2014 0819   CO2 25 06/01/2012 1008   GLUCOSE 81 05/15/2015 1051   GLUCOSE 93 01/13/2015 1730   GLUCOSE 89 04/22/2013 0922   GLUCOSE 108 06/01/2012 1008   BUN 8.4 05/15/2015 1051   BUN 12 01/13/2015 1730   BUN 9 06/01/2012 1008   CREATININE 0.7 05/15/2015 1051   CREATININE 0.80 01/13/2015 1730   CREATININE 0.9 06/01/2012 1008   CALCIUM 9.3 05/15/2015 1051   CALCIUM 10.8* 06/13/2014 0819   CALCIUM 9.8 06/01/2012 1008   PROT 6.2* 05/15/2015 1051   PROT 7.5 06/13/2014 0819   PROT 8.1 08/14/2010 1251   ALBUMIN 2.8* 05/15/2015 1051   ALBUMIN 3.9 06/13/2014 0819   AST 21 05/15/2015 1051   AST 23 06/13/2014 0819   AST 19 08/14/2010 1251   ALT 30 05/15/2015 1051   ALT 13 06/13/2014 0819   ALT 9* 08/14/2010 1251   ALKPHOS 81 05/15/2015 1051   ALKPHOS 89 06/13/2014 0819   ALKPHOS 78 08/14/2010 1251   BILITOT 0.30 05/15/2015 1051   BILITOT 0.2* 06/13/2014 0819   BILITOT 0.40 08/14/2010 1251   GFRNONAA 87*  07/06/2012 1122   GFRAA >90 07/06/2012 1122    No results found for: SPEP, UPEP  Lab Results  Component Value Date   WBC 3.4* 05/15/2015   NEUTROABS 1.9 05/15/2015   HGB 7.9* 05/15/2015   HCT 24.7* 05/15/2015   MCV 79.7 05/15/2015   PLT 330 05/15/2015      Chemistry      Component Value Date/Time   NA 139 05/15/2015 1051   NA 139 01/13/2015 1730   NA 142 06/01/2012 1008   K 3.7 05/15/2015 1051   K 4.0 01/13/2015 1730   K 3.8 06/01/2012 1008   CL 103 01/13/2015 1730   CL 107 04/22/2013 0922   CL 103 06/01/2012 1008   CO2 25 05/15/2015 1051   CO2 25 06/13/2014 0819   CO2 25 06/01/2012 1008   BUN 8.4 05/15/2015 1051   BUN 12 01/13/2015 1730   BUN 9 06/01/2012 1008   CREATININE 0.7 05/15/2015 1051   CREATININE 0.80 01/13/2015 1730  CREATININE 0.9 06/01/2012 1008      Component Value Date/Time   CALCIUM 9.3 05/15/2015 1051   CALCIUM 10.8* 06/13/2014 0819   CALCIUM 9.8 06/01/2012 1008   ALKPHOS 81 05/15/2015 1051   ALKPHOS 89 06/13/2014 0819   ALKPHOS 78 08/14/2010 1251   AST 21 05/15/2015 1051   AST 23 06/13/2014 0819   AST 19 08/14/2010 1251   ALT 30 05/15/2015 1051   ALT 13 06/13/2014 0819   ALT 9* 08/14/2010 1251   BILITOT 0.30 05/15/2015 1051   BILITOT 0.2* 06/13/2014 0819   BILITOT 0.40 08/14/2010 1251     ASSESSMENT & PLAN:  Cancer of left breast Clinically, she is not doing well. She tolerated chemotherapy very poorly and started become noncompliant. The breast cancer tumor marker fluctuated in the past. She is getting very anemic. I discussed with her possibility of stopping treatment to proceed with palliative radiation therapy and restaging scan next month. She elected to proceed with treatment today along with blood transfusion support. I will order a staging CT scan next month for further assessment  Lymphoma, small lymphocytic She is noncompliant. Previously, I reduced the dose of Imbruvica the from 3 tablets to 2 tablets due to significant  diarrhea.  The patient stated that the pill is making her cough and miserable.  She reduce it to one tablet daily without consulting with me. Clinically, the lymph nodes are getting larger. I recommend she stop treatment to pursue palliative radiation therapy. I plan to restage her disease next month.  Anemia in neoplastic disease We discussed some of the risks, benefits, and alternatives of blood transfusions. The patient is symptomatic from anemia and the hemoglobin level is critically low.  Some of the side-effects to be expected including risks of transfusion reactions, chills, infection, syndrome of volume overload and risk of hospitalization from various reasons and the patient is willing to proceed and went ahead to sign consent today. I will proceed to give her 1 unit of blood and she will continue treatment as scheduled.  With the patient's permission, I will consult with her granddaughter about future follow-up care. The patient is quite unrealistic with expectation of treatment. She wanted some treatment that will not make her sick, will not make her lose hair and that would give her longevity. I will proceed with plan of care as above. No orders of the defined types were placed in this encounter.   All questions were answered. The patient knows to call the clinic with any problems, questions or concerns. No barriers to learning was detected. I spent 30 minutes counseling the patient face to face. The total time spent in the appointment was 30 minutes and more than 50% was on counseling and review of test results     [facility], [place], MD 05/15/2015 12:57 PM

## 2015-05-15 NOTE — Telephone Encounter (Signed)
Pt confirmed labs/ov per 07/12 POF, gave pt AVS and Calendar... KJ, gave pt barium

## 2015-05-15 NOTE — Patient Instructions (Signed)
[facility] Discharge Instructions for Patients Receiving Chemotherapy  Today you received the following chemotherapy agents Gemzar  To help prevent nausea and vomiting after your treatment, we encourage you to take your nausea medication     If you develop nausea and vomiting that is not controlled by your nausea medication, call the clinic.   BELOW ARE SYMPTOMS THAT SHOULD BE REPORTED IMMEDIATELY:  *FEVER GREATER THAN 100.5 F  *CHILLS WITH OR WITHOUT FEVER  NAUSEA AND VOMITING THAT IS NOT CONTROLLED WITH YOUR NAUSEA MEDICATION  *UNUSUAL SHORTNESS OF BREATH  *UNUSUAL BRUISING OR BLEEDING  TENDERNESS IN MOUTH AND THROAT WITH OR WITHOUT PRESENCE OF ULCERS  *URINARY PROBLEMS  *BOWEL PROBLEMS  UNUSUAL RASH Items with * indicate a potential emergency and should be followed up as soon as possible.  Feel free to call the clinic you have any questions or concerns. The clinic phone number is [phone].  Please show the [facility] at check-in to the Emergency Department and triage nurse.  Blood Transfusion  A blood transfusion replaces your blood or some of its parts. Blood is replaced when you have lost blood because of surgery, an accident, or for severe blood conditions like anemia. You can donate blood to be used on yourself if you have a planned surgery. If you lose blood during that surgery, your own blood can be given back to you. Any blood given to you is checked to make sure it matches your blood type. Your temperature, blood pressure, and heart rate (vital signs) will be checked often.  GET HELP RIGHT AWAY IF:   You feel sick to your stomach (nauseous) or throw up (vomit).  You have watery poop (diarrhea).  You have shortness of breath or trouble breathing.  You have blood in your pee (urine) or have dark colored pee.  You have chest pain or tightness.  Your eyes or skin turn yellow (jaundice).  You have a temperature by mouth above 102 F  (38.9 C), not controlled by medicine.  You start to shake and have chills.  You develop a a red rash (hives) or feel itchy.  You develop lightheadedness or feel confused.  You develop back, joint, or muscle pain.  You do not feel hungry (lost appetite).  You feel tired, restless, or nervous.  You develop belly (abdominal) cramps. Document Released: 01/16/2009 Document Revised: 01/12/2012 Document Reviewed: 01/16/2009 [facility] Patient Information 2015 [place], [place]. This information is not intended to replace advice given to you by your health care provider. Make sure you discuss any questions you have with your health care provider.

## 2015-05-15 NOTE — Assessment & Plan Note (Signed)
She is noncompliant. Previously, I reduced the dose of Imbruvica the from 3 tablets to 2 tablets due to significant diarrhea.  The patient stated that the pill is making her cough and miserable.  She reduce it to one tablet daily without consulting with me. Clinically, the lymph nodes are getting larger. I recommend she stop treatment to pursue palliative radiation therapy. I plan to restage her disease next month.

## 2015-05-15 NOTE — Assessment & Plan Note (Signed)
We discussed some of the risks, benefits, and alternatives of blood transfusions. The patient is symptomatic from anemia and the hemoglobin level is critically low.  Some of the side-effects to be expected including risks of transfusion reactions, chills, infection, syndrome of volume overload and risk of hospitalization from various reasons and the patient is willing to proceed and went ahead to sign consent today. I will proceed to give her 1 unit of blood and she will continue treatment as scheduled.

## 2015-05-15 NOTE — Assessment & Plan Note (Signed)
Clinically, she is not doing well. She tolerated chemotherapy very poorly and started become noncompliant. The breast cancer tumor marker fluctuated in the past. She is getting very anemic. I discussed with her possibility of stopping treatment to proceed with palliative radiation therapy and restaging scan next month. She elected to proceed with treatment today along with blood transfusion support. I will order a staging CT scan next month for further assessment

## 2015-05-15 NOTE — Patient Instructions (Signed)

## 2015-05-15 NOTE — Progress Notes (Signed)
Patient receiving 1 unit PRBCs today and Gemzar todaly.  Hgb = 7.9 today

## 2015-05-16 LAB — TYPE AND SCREEN
ABO/RH(D): O POS
Antibody Screen: NEGATIVE
Unit division: 0

## 2015-05-16 LAB — CANCER ANTIGEN 27.29: CA 27.29: 213 U/mL — ABNORMAL HIGH (ref 0–39)

## 2015-05-25 ENCOUNTER — Telehealth: Payer: Self-pay | Admitting: *Deleted

## 2015-05-25 NOTE — Telephone Encounter (Signed)
Pt requesting refill on "chemo pill"  Ibrutinib.  States only has 2 pills left and is taking 2 pills per day.   Informed pt Dr. Alvy Bimler recommended pt stop the chemo pill d/t side effects and to proceed with Radiation treatments as scheduled.   Pt said she did not remember Dr. Alvy Bimler telling her to stop the chemo pill.   I re assured pt it clearly states in last office note to stop Ibrutinib.   Keep appts w/ Rad Onc and for CT scan and next office visit at end of this month.   Suggested pt discuss the chemo pill with Dr. Alvy Bimler on next visit but meanwhile do not take it.   She verbalized understanding.

## 2015-05-28 ENCOUNTER — Ambulatory Visit
Admission: RE | Admit: 2015-05-28 | Discharge: 2015-05-28 | Disposition: A | Discharge status: Home / Self Care | Payer: Medicare Other | Source: Ambulatory Visit | Attending: Radiation Oncology | Admitting: Radiation Oncology

## 2015-05-28 ENCOUNTER — Encounter: Payer: Self-pay | Admitting: *Deleted

## 2015-05-28 DIAGNOSIS — C770 Secondary and unspecified malignant neoplasm of lymph nodes of head, face and neck: Secondary | ICD-10-CM

## 2015-05-28 DIAGNOSIS — C7951 Secondary malignant neoplasm of bone: Secondary | ICD-10-CM | POA: Diagnosis not present

## 2015-05-28 NOTE — Progress Notes (Signed)
Called moreheed simkin  At [facility], where patient  Is residing at [phone], left voice message for someone call back, we aere trying to find out what transportation she is using, patient confused and doesn't remember she thought [facility] transportation 2:56 PM'

## 2015-05-29 ENCOUNTER — Telehealth: Payer: Self-pay | Admitting: Hematology and Oncology

## 2015-05-29 ENCOUNTER — Encounter: Payer: Self-pay | Admitting: *Deleted

## 2015-05-29 ENCOUNTER — Ambulatory Visit (HOSPITAL_BASED_OUTPATIENT_CLINIC_OR_DEPARTMENT_OTHER): Payer: Medicare Other

## 2015-05-29 ENCOUNTER — Other Ambulatory Visit: Payer: Self-pay | Admitting: Hematology and Oncology

## 2015-05-29 ENCOUNTER — Encounter: Payer: Self-pay | Admitting: Hematology and Oncology

## 2015-05-29 ENCOUNTER — Telehealth: Payer: Self-pay | Admitting: *Deleted

## 2015-05-29 ENCOUNTER — Ambulatory Visit (HOSPITAL_BASED_OUTPATIENT_CLINIC_OR_DEPARTMENT_OTHER): Payer: Medicare Other | Admitting: Hematology and Oncology

## 2015-05-29 ENCOUNTER — Ambulatory Visit (HOSPITAL_COMMUNITY)
Admission: RE | Admit: 2015-05-29 | Discharge: 2015-05-29 | Disposition: A | Discharge status: Home / Self Care | Payer: Medicare Other | Source: Ambulatory Visit | Attending: Radiation Oncology | Admitting: Radiation Oncology

## 2015-05-29 VITALS — BP 129/54 | HR 94 | Temp 97.8°F | Resp 18 | Ht 62.0 in | Wt 125.4 lb

## 2015-05-29 DIAGNOSIS — M7989 Other specified soft tissue disorders: Secondary | ICD-10-CM | POA: Insufficient documentation

## 2015-05-29 DIAGNOSIS — C7951 Secondary malignant neoplasm of bone: Secondary | ICD-10-CM | POA: Diagnosis present

## 2015-05-29 DIAGNOSIS — C8300 Small cell B-cell lymphoma, unspecified site: Secondary | ICD-10-CM

## 2015-05-29 DIAGNOSIS — I82C11 Acute embolism and thrombosis of right internal jugular vein: Secondary | ICD-10-CM

## 2015-05-29 DIAGNOSIS — D630 Anemia in neoplastic disease: Secondary | ICD-10-CM | POA: Diagnosis not present

## 2015-05-29 DIAGNOSIS — Z95828 Presence of other vascular implants and grafts: Secondary | ICD-10-CM

## 2015-05-29 DIAGNOSIS — C770 Secondary and unspecified malignant neoplasm of lymph nodes of head, face and neck: Secondary | ICD-10-CM

## 2015-05-29 DIAGNOSIS — C50912 Malignant neoplasm of unspecified site of left female breast: Secondary | ICD-10-CM | POA: Diagnosis present

## 2015-05-29 DIAGNOSIS — I82401 Acute embolism and thrombosis of unspecified deep veins of right lower extremity: Secondary | ICD-10-CM

## 2015-05-29 HISTORY — DX: Acute embolism and thrombosis of unspecified deep veins of right lower extremity: I82.401

## 2015-05-29 LAB — HOLD TUBE, BLOOD BANK

## 2015-05-29 LAB — CBC WITH DIFFERENTIAL/PLATELET
BASO%: 0.1 % (ref 0.0–2.0)
Basophils Absolute: 0 10*3/uL (ref 0.0–0.1)
EOS%: 0.7 % (ref 0.0–7.0)
Eosinophils Absolute: 0 10*3/uL (ref 0.0–0.5)
HEMATOCRIT: 27 % — AB (ref 34.8–46.6)
HGB: 8.6 g/dL — ABNORMAL LOW (ref 11.6–15.9)
LYMPH#: 0.3 10*3/uL — AB (ref 0.9–3.3)
LYMPH%: 5.7 % — ABNORMAL LOW (ref 14.0–49.7)
MCH: 25.4 pg (ref 25.1–34.0)
MCHC: 32 g/dL (ref 31.5–36.0)
MCV: 79.4 fL — AB (ref 79.5–101.0)
MONO#: 2.4 10*3/uL — ABNORMAL HIGH (ref 0.1–0.9)
MONO%: 50.7 % — AB (ref 0.0–14.0)
NEUT%: 42.8 % (ref 38.4–76.8)
NEUTROS ABS: 2.1 10*3/uL (ref 1.5–6.5)
Platelets: 483 10*3/uL — ABNORMAL HIGH (ref 145–400)
RBC: 3.4 10*6/uL — ABNORMAL LOW (ref 3.70–5.45)
RDW: 19.3 % — ABNORMAL HIGH (ref 11.2–14.5)
WBC: 4.8 10*3/uL (ref 3.9–10.3)

## 2015-05-29 LAB — COMPREHENSIVE METABOLIC PANEL (CC13)
ALBUMIN: 2.5 g/dL — AB (ref 3.5–5.0)
ALK PHOS: 105 U/L (ref 40–150)
ALT: 34 U/L (ref 0–55)
ANION GAP: 11 meq/L (ref 3–11)
AST: 37 U/L — AB (ref 5–34)
BUN: 15.8 mg/dL (ref 7.0–26.0)
CO2: 24 meq/L (ref 22–29)
Calcium: 9 mg/dL (ref 8.4–10.4)
Chloride: 101 mEq/L (ref 98–109)
Creatinine: 0.7 mg/dL (ref 0.6–1.1)
GLUCOSE: 75 mg/dL (ref 70–140)
Potassium: 3.6 mEq/L (ref 3.5–5.1)
SODIUM: 136 meq/L (ref 136–145)
Total Bilirubin: 0.6 mg/dL (ref 0.20–1.20)
Total Protein: 6.2 g/dL — ABNORMAL LOW (ref 6.4–8.3)

## 2015-05-29 MED ORDER — HYDROCODONE-ACETAMINOPHEN 5-325 MG PO TABS: 1.0000 | ORAL_TABLET | Freq: Four times a day (QID) | ORAL | Status: DC | PRN

## 2015-05-29 MED ORDER — RIVAROXABAN (XARELTO) VTE STARTER PACK (15 & 20 MG): ORAL_TABLET | ORAL | Status: DC

## 2015-05-29 MED ORDER — HEPARIN SOD (PORK) LOCK FLUSH 100 UNIT/ML IV SOLN
500.0000 [IU] | Freq: Once | INTRAVENOUS | Status: AC
  Administered 2015-05-29: 500 [IU] via INTRAVENOUS
  Filled 2015-05-29: qty 5

## 2015-05-29 MED ORDER — SODIUM CHLORIDE 0.9 % IJ SOLN
10.0000 mL | INTRAMUSCULAR | Status: DC | PRN
  Administered 2015-05-29: 10 mL via INTRAVENOUS
  Filled 2015-05-29: qty 10

## 2015-05-29 NOTE — Progress Notes (Addendum)
*  Preliminary Results* Right upper extremity venous duplex completed. Right upper extremity is positive for age indeterminate deep vein thrombosis involving the right internal jugular vein.  Preliminary results discussed with Dr. Alvy Bimler, patient instructed to go to cancer center.   05/29/2015 9:29 AM  Maudry Mayhew, RVT, RDCS, RDMS

## 2015-05-29 NOTE — Patient Instructions (Signed)

## 2015-05-29 NOTE — Telephone Encounter (Signed)
Michelle from [facility] called to giv results of patient's [facility] RUE=Positive for DVT involving right internal; jugular, asked if MD wants to do anything or send patient home?<Asked Dr. Arloa Koh, he directed this to go to Dr. Alvy Bimler and for Dr. Alvy Bimler to decide, Dr. Lisbeth Renshaw out of office today, Sharyn Lull stated she would call Dr. Alvy Bimler, thanked [place] 9:40 AM

## 2015-05-29 NOTE — Progress Notes (Signed)
[facility] Psychosocial Distress Screening Clinical Social Work  Clinical Social Work was referred by distress screening protocol.  The patient scored a 7 on the Psychosocial Distress Thermometer which indicates moderate distress. Clinical Social Worker met with patient in lobby to assess for distress and other psychosocial needs. Helen [name] stated she has no concerns.  She stated transportation is not a concern because she can ride with family members or use Medicaid transportation.  She shared she continues to nervous regarding cancer diagnosis, but does not feel she has any concerns at this time.  CSW encouraged patient to contact CSW as needed.  ONCBCN DISTRESS SCREENING 05/09/2015  Screening Type Change in Status  Distress experienced in past week (1-10) 7  Practical problem type Transportation  Emotional problem type Nervousness/Anxiety;Adjusting to illness;Adjusting to appearance changes  Spiritual/Religous concerns type Relating to God;Facing my mortality  Physical Problem type Pain;Sleep/insomnia;Getting around;Mouth sores/swallowing;Talking;Constipation/diarrhea;Changes in urination;Tingling hands/feet;Skin dry/itchy;Swollen arms/legs  Physician notified of physical symptoms Yes  Referral to clinical social work Yes  Referral to financial advocate    After CSW visit, CSW received referral from medical oncologist for referral to dental provider that accepts patients insurance.  CSW research dental providers that accept [place] Medicaid and contacted patient.  CSW will mail patient a letter containing provider information and patient agreed to contact them to schedule an appointment.   Clinical Social Worker follow up needed: No.  If yes, follow up plan:  Helen [name], MSW, LCSW, OSW-C Clinical Social Worker [facility] [phone]

## 2015-05-29 NOTE — Telephone Encounter (Signed)
Gave and printed appt sched and avs for pt for July and Aug °

## 2015-05-30 ENCOUNTER — Encounter: Payer: Self-pay | Admitting: Hematology and Oncology

## 2015-05-30 ENCOUNTER — Telehealth: Payer: Self-pay

## 2015-05-30 NOTE — Assessment & Plan Note (Signed)
This is likely related to compression of right IJ flow by her lymphoma and presence of a port in that location We discussed various treatment options and ultimately she elected to try Xarelto The risks, benefits and side-effects of Xarelto is discussed

## 2015-05-30 NOTE — Assessment & Plan Note (Signed)
Clinically, she is not doing well. She tolerated chemotherapy very poorly and started become noncompliant. The breast cancer tumor marker fluctuated in the past. She is getting very anemic. I have ordered a staging CT scan next month for further assessment

## 2015-05-30 NOTE — Assessment & Plan Note (Signed)
This is likely due to recent treatment. The patient denies recent history of bleeding such as epistaxis, hematuria or hematochezia. She is asymptomatic from the anemia. I will observe for now.  She does not require transfusion now. 

## 2015-05-30 NOTE — Telephone Encounter (Signed)
Helen [name] called for pt. Their pharmacy did not have xarelto starter pack yesterday. They will pick it up today.  Called pt back at 127 pm to acknowledge message. Called [facility] and the Rx has been picked up.

## 2015-05-30 NOTE — Progress Notes (Signed)
Helen [name] OFFICE PROGRESS NOTE  Patient Care Team: [name], MD as PCP - General (Hematology and Oncology) [name], MD as Consulting Physician (Hematology and Oncology)  SUMMARY OF ONCOLOGIC HISTORY:   Cancer of left breast   08/03/2008 Initial Diagnosis Breast CA   11/06/2014 Imaging Repeat CT scan of the chest, abdomen and pelvis show regression in the size of liver metastasis and pulmonary metastasis with mild progression of lymphadenopathy   11/06/2014 Tumor Marker [id number] is elevated at 275   01/18/2015 Imaging MRI head is negative for metastatic disease   01/18/2015 Imaging CT scan of the chest, abdomen and pelvis show significant disease progression throughout.   01/25/2015 -  Chemotherapy She started Gemzar every other week and Ibrutinib   02/20/2015 Tumor Marker CA 27-29 at 246   03/30/2015 Imaging Repeat CT scan of the chest, abdomen and pelvis show positive response to treatment for lymphoma. Liver lesions are stable.    INTERVAL HISTORY: Please see below for problem oriented charting. She is seen urgent;y because of new right arm and neck swelling and possible acute Right IJ thrombosis She has progressive right neck swelling for the past few weeks but noticed new onset of right arm swelling for the past few days She has mild discomfort. She complained of fatigue  REVIEW OF SYSTEMS:   Constitutional: Denies fevers, chills or abnormal weight loss Eyes: Denies blurriness of vision Ears, nose, mouth, throat, and face: Denies mucositis or sore throat Respiratory: Denies cough, dyspnea or wheezes Cardiovascular: Denies palpitation, chest discomfort Gastrointestinal:  Denies nausea, heartburn or change in bowel habits Skin: Denies abnormal skin rashes Neurological:Denies numbness, tingling or new weaknesses Behavioral/Psych: Mood is stable, no new changes  All other systems were reviewed with the patient and are negative.  I have reviewed the past medical  history, past surgical history, social history and family history with the patient and they are unchanged from previous note.  ALLERGIES:  is allergic to codeine; penicillins; sulfonamide derivatives; tape; and tramadol hcl.  MEDICATIONS:  Current Outpatient Prescriptions  Medication Sig Dispense Refill  . HYDROcodone-acetaminophen (NORCO) 5-325 MG per tablet Take 1 tablet by mouth every 6 (six) hours as needed for moderate pain. 60 tablet 0  . lidocaine-prilocaine (EMLA) cream Apply 1 application topically as needed. (Patient not taking: Reported on 05/15/2015) 30 g 6  . Rivaroxaban (XARELTO STARTER PACK) 15 & 20 MG TBPK Take as directed on package: Take one 15mg  tablet by mouth twice a day with food. On Day 22, switch to one 20mg  tablet once a day with food. 51 each 0   No current facility-administered medications for this visit.    PHYSICAL EXAMINATION: ECOG PERFORMANCE STATUS: 2 - Symptomatic, <50% confined to bed  Filed Vitals:   05/29/15 1012  BP: 129/54  Pulse: 94  Temp: 97.8 F (36.6 C)  Resp: 18   Filed Weights   05/29/15 1012  Weight: 125 lb 6.4 oz (56.881 kg)    GENERAL:alert, no distress and comfortable SKIN: skin color, texture, turgor are normal, no rashes or significant lesions EYES: normal, Conjunctiva are pink and non-injected, sclera clear OROPHARYNX:no exudate, no erythema and lips, buccal mucosa, and tongue normal  NECK: supple, thyroid normal size, non-tender, without nodularity LYMPH:  palpable lymphadenopathy in the right neck, worse compared to previous exam LUNGS: clear to auscultation and percussion with normal breathing effort HEART: regular rate & rhythm and no murmurs. She has moderate right arm extremity edema ABDOMEN:abdomen soft, non-tender and normal bowel  sounds Musculoskeletal:no cyanosis of digits and no clubbing  NEURO: alert & oriented x 3 with fluent speech, no focal motor/sensory deficits  LABORATORY DATA:  I have reviewed the data as  listed    Component Value Date/Time   NA 136 05/29/2015 1059   NA 139 01/13/2015 1730   NA 142 06/01/2012 1008   K 3.6 05/29/2015 1059   K 4.0 01/13/2015 1730   K 3.8 06/01/2012 1008   CL 103 01/13/2015 1730   CL 107 04/22/2013 0922   CL 103 06/01/2012 1008   CO2 24 05/29/2015 1059   CO2 25 06/13/2014 0819   CO2 25 06/01/2012 1008   GLUCOSE 75 05/29/2015 1059   GLUCOSE 93 01/13/2015 1730   GLUCOSE 89 04/22/2013 0922   GLUCOSE 108 06/01/2012 1008   BUN 15.8 05/29/2015 1059   BUN 12 01/13/2015 1730   BUN 9 06/01/2012 1008   CREATININE 0.7 05/29/2015 1059   CREATININE 0.80 01/13/2015 1730   CREATININE 0.9 06/01/2012 1008   CALCIUM 9.0 05/29/2015 1059   CALCIUM 10.8* 06/13/2014 0819   CALCIUM 9.8 06/01/2012 1008   PROT 6.2* 05/29/2015 1059   PROT 7.5 06/13/2014 0819   PROT 8.1 08/14/2010 1251   ALBUMIN 2.5* 05/29/2015 1059   ALBUMIN 3.9 06/13/2014 0819   AST 37* 05/29/2015 1059   AST 23 06/13/2014 0819   AST 19 08/14/2010 1251   ALT 34 05/29/2015 1059   ALT 13 06/13/2014 0819   ALT 9* 08/14/2010 1251   ALKPHOS 105 05/29/2015 1059   ALKPHOS 89 06/13/2014 0819   ALKPHOS 78 08/14/2010 1251   BILITOT 0.60 05/29/2015 1059   BILITOT 0.2* 06/13/2014 0819   BILITOT 0.40 08/14/2010 1251   GFRNONAA 87* 07/06/2012 1122   GFRAA >90 07/06/2012 1122    No results found for: SPEP, UPEP  Lab Results  Component Value Date   WBC 4.8 05/29/2015   NEUTROABS 2.1 05/29/2015   HGB 8.6* 05/29/2015   HCT 27.0* 05/29/2015   MCV 79.4* 05/29/2015   PLT 483* 05/29/2015      Chemistry      Component Value Date/Time   NA 136 05/29/2015 1059   NA 139 01/13/2015 1730   NA 142 06/01/2012 1008   K 3.6 05/29/2015 1059   K 4.0 01/13/2015 1730   K 3.8 06/01/2012 1008   CL 103 01/13/2015 1730   CL 107 04/22/2013 0922   CL 103 06/01/2012 1008   CO2 24 05/29/2015 1059   CO2 25 06/13/2014 0819   CO2 25 06/01/2012 1008   BUN 15.8 05/29/2015 1059   BUN 12 01/13/2015 1730   BUN 9  06/01/2012 1008   CREATININE 0.7 05/29/2015 1059   CREATININE 0.80 01/13/2015 1730   CREATININE 0.9 06/01/2012 1008      Component Value Date/Time   CALCIUM 9.0 05/29/2015 1059   CALCIUM 10.8* 06/13/2014 0819   CALCIUM 9.8 06/01/2012 1008   ALKPHOS 105 05/29/2015 1059   ALKPHOS 89 06/13/2014 0819   ALKPHOS 78 08/14/2010 1251   AST 37* 05/29/2015 1059   AST 23 06/13/2014 0819   AST 19 08/14/2010 1251   ALT 34 05/29/2015 1059   ALT 13 06/13/2014 0819   ALT 9* 08/14/2010 1251   BILITOT 0.60 05/29/2015 1059   BILITOT 0.2* 06/13/2014 0819   BILITOT 0.40 08/14/2010 1251      ASSESSMENT & PLAN:  Cancer of left breast Clinically, she is not doing well. She tolerated chemotherapy very poorly and started become noncompliant. The  breast cancer tumor marker fluctuated in the past. She is getting very anemic. 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 We discussed more aggressive treatment for CLL/SLL but she does not want "significant" side-effects I will wait for imaging studies next month and reassess In the mean time, I recommend palliative radiation treatment She may be a candidate for palliative care and hospice in the future if she chose not to receive aggressive treatment  Anemia in neoplastic disease This is likely due to recent treatment. The patient denies recent history of bleeding such as epistaxis, hematuria or hematochezia. She is asymptomatic from the anemia. I will observe for now.  She does not require transfusion  now.   No orders of the defined types were placed in this encounter.   All questions were answered. The patient knows to call the clinic with any problems, questions or concerns. No barriers to learning was detected. I spent 30 minutes counseling the patient face to face. The total time spent in the appointment was 40 minutes and more than 50% was on counseling and review of test results     [facility], Keyanni Whittinghill, MD 05/30/2015 9:05 PM

## 2015-05-30 NOTE — Assessment & Plan Note (Signed)
Her lymphoma is getting worse I am concerned about non-compliance with oral chemotherapies. We discussed more aggressive treatment for CLL/SLL but she does not want "significant" side-effects I will wait for imaging studies next month and reassess In the mean time, I recommend palliative radiation treatment She may be a candidate for palliative care and hospice in the future if she chose not to receive aggressive treatment

## 2015-06-01 DIAGNOSIS — C7951 Secondary malignant neoplasm of bone: Secondary | ICD-10-CM | POA: Diagnosis not present

## 2015-06-04 ENCOUNTER — Other Ambulatory Visit: Payer: Self-pay | Admitting: Hematology and Oncology

## 2015-06-04 ENCOUNTER — Ambulatory Visit
Admission: RE | Admit: 2015-06-04 | Discharge: 2015-06-04 | Disposition: A | Discharge status: Home / Self Care | Payer: Medicare Other | Source: Ambulatory Visit | Attending: Radiation Oncology | Admitting: Radiation Oncology

## 2015-06-04 DIAGNOSIS — C7951 Secondary malignant neoplasm of bone: Secondary | ICD-10-CM | POA: Diagnosis not present

## 2015-06-05 ENCOUNTER — Telehealth: Payer: Self-pay

## 2015-06-05 ENCOUNTER — Ambulatory Visit (HOSPITAL_BASED_OUTPATIENT_CLINIC_OR_DEPARTMENT_OTHER): Payer: Medicare Other

## 2015-06-05 ENCOUNTER — Telehealth: Payer: Self-pay | Admitting: *Deleted

## 2015-06-05 ENCOUNTER — Ambulatory Visit
Admission: RE | Admit: 2015-06-05 | Discharge: 2015-06-05 | Disposition: A | Discharge status: Home / Self Care | Payer: Medicare Other | Source: Ambulatory Visit | Attending: Radiation Oncology | Admitting: Radiation Oncology

## 2015-06-05 ENCOUNTER — Other Ambulatory Visit: Payer: Self-pay

## 2015-06-05 VITALS — BP 107/54 | HR 86 | Temp 97.6°F | Resp 18

## 2015-06-05 DIAGNOSIS — Z452 Encounter for adjustment and management of vascular access device: Secondary | ICD-10-CM

## 2015-06-05 DIAGNOSIS — Z95828 Presence of other vascular implants and grafts: Secondary | ICD-10-CM

## 2015-06-05 DIAGNOSIS — C8300 Small cell B-cell lymphoma, unspecified site: Secondary | ICD-10-CM

## 2015-06-05 DIAGNOSIS — C7951 Secondary malignant neoplasm of bone: Secondary | ICD-10-CM | POA: Diagnosis not present

## 2015-06-05 DIAGNOSIS — C50912 Malignant neoplasm of unspecified site of left female breast: Secondary | ICD-10-CM | POA: Diagnosis not present

## 2015-06-05 LAB — CBC WITH DIFFERENTIAL/PLATELET
BASO%: 0.2 % (ref 0.0–2.0)
BASOS ABS: 0 10*3/uL (ref 0.0–0.1)
EOS%: 2.5 % (ref 0.0–7.0)
Eosinophils Absolute: 0.1 10*3/uL (ref 0.0–0.5)
HCT: 26.6 % — ABNORMAL LOW (ref 34.8–46.6)
HGB: 8.5 g/dL — ABNORMAL LOW (ref 11.6–15.9)
LYMPH#: 0.4 10*3/uL — AB (ref 0.9–3.3)
LYMPH%: 8.2 % — AB (ref 14.0–49.7)
MCH: 25.2 pg (ref 25.1–34.0)
MCHC: 31.8 g/dL (ref 31.5–36.0)
MCV: 79.2 fL — AB (ref 79.5–101.0)
MONO#: 1.6 10*3/uL — ABNORMAL HIGH (ref 0.1–0.9)
MONO%: 33.1 % — ABNORMAL HIGH (ref 0.0–14.0)
NEUT%: 56 % (ref 38.4–76.8)
NEUTROS ABS: 2.7 10*3/uL (ref 1.5–6.5)
PLATELETS: 613 10*3/uL — AB (ref 145–400)
RBC: 3.36 10*6/uL — ABNORMAL LOW (ref 3.70–5.45)
RDW: 20 % — ABNORMAL HIGH (ref 11.2–14.5)
WBC: 4.8 10*3/uL (ref 3.9–10.3)

## 2015-06-05 LAB — HOLD TUBE, BLOOD BANK

## 2015-06-05 MED ORDER — SODIUM CHLORIDE 0.9 % IJ SOLN
10.0000 mL | INTRAMUSCULAR | Status: DC | PRN
  Administered 2015-06-05: 10 mL via INTRAVENOUS
  Filled 2015-06-05: qty 10

## 2015-06-05 MED ORDER — HEPARIN SOD (PORK) LOCK FLUSH 100 UNIT/ML IV SOLN
500.0000 [IU] | Freq: Once | INTRAVENOUS | Status: AC
Start: 2015-06-05 — End: 2015-06-05
  Administered 2015-06-05: 500 [IU] via INTRAVENOUS
  Filled 2015-06-05: qty 5

## 2015-06-05 NOTE — Patient Instructions (Signed)

## 2015-06-05 NOTE — Telephone Encounter (Signed)
Called pt this morning to inform we moved her lab appt closer to her XRT appt..  Lab was scheduled at 2:45 pm and XRT at 11 am.  When I called pt she was upset and said she didn't know XRT was at 11 am and she was not ready to go when [facility] arrived at her home.  She says her scheduled only said 2:45 pm.  Informed pt will contact RadOnc dept about her schedule.  Instructed her to have lab done before or after XRT today but if XRT is canceled today we can move lab to tomorrow.   Pt verbalized understanding. Notified [employer] in [place] of above.

## 2015-06-05 NOTE — Telephone Encounter (Signed)
Labs will be done today before or after radiation.If patient misses today labs will be scheduled later this week.

## 2015-06-06 ENCOUNTER — Ambulatory Visit
Admission: RE | Admit: 2015-06-06 | Discharge: 2015-06-06 | Disposition: A | Discharge status: Home / Self Care | Payer: Medicare Other | Source: Ambulatory Visit | Attending: Radiation Oncology | Admitting: Radiation Oncology

## 2015-06-06 DIAGNOSIS — C7951 Secondary malignant neoplasm of bone: Secondary | ICD-10-CM | POA: Diagnosis not present

## 2015-06-07 ENCOUNTER — Ambulatory Visit
Admission: RE | Admit: 2015-06-07 | Discharge: 2015-06-07 | Disposition: A | Discharge status: Home / Self Care | Payer: Medicare Other | Source: Ambulatory Visit | Attending: Radiation Oncology | Admitting: Radiation Oncology

## 2015-06-07 DIAGNOSIS — C7951 Secondary malignant neoplasm of bone: Secondary | ICD-10-CM | POA: Diagnosis not present

## 2015-06-08 ENCOUNTER — Encounter: Payer: Self-pay | Admitting: Radiation Oncology

## 2015-06-08 ENCOUNTER — Ambulatory Visit
Admission: RE | Admit: 2015-06-08 | Discharge: 2015-06-08 | Disposition: A | Discharge status: Home / Self Care | Payer: Medicare Other | Source: Ambulatory Visit | Attending: Radiation Oncology | Admitting: Radiation Oncology

## 2015-06-08 VITALS — BP 127/57 | HR 93 | Temp 97.6°F | Resp 20 | Wt 125.0 lb

## 2015-06-08 DIAGNOSIS — C7951 Secondary malignant neoplasm of bone: Secondary | ICD-10-CM | POA: Diagnosis not present

## 2015-06-08 DIAGNOSIS — C8300 Small cell B-cell lymphoma, unspecified site: Secondary | ICD-10-CM

## 2015-06-08 NOTE — Progress Notes (Addendum)
Weekly rad tx 4/10 right neck completed, no skin changes, c/o of mask tightness, and makes her uncomfortable  On left side of neck, no nausea, appetite good stated patient  Fatigued, only takes her hydrocodone apap for her arthritis, c.o low back pain, stated she could use her hydrocodone med for back pain, NO I don't want to get addicted",  Encourage  Her to drink plenty fluids and eat smaller meals  , pt education done, fatigue,  Pain,skin irritation, throat changes, nausea discussed ways to manage them,verbal understanding,  , called [employer] transportation at [phone], to pick patient up in front of the Lobby at the cancer center 11:04 AM BP 127/57 mmHg  Pulse 93  Temp(Src) 97.6 F (36.4 C) (Oral)  Resp 20  Wt 125 lb (56.7 kg)  SpO2 100%  Wt Readings from Last 3 Encounters:  06/08/15 125 lb (56.7 kg)  05/29/15 125 lb 6.4 oz (56.881 kg)  05/15/15 125 lb 14.4 oz (57.108 kg)

## 2015-06-08 NOTE — Progress Notes (Signed)
   Department of Radiation Oncology  Phone:  [phone] Fax:        [phone]  Weekly Treatment Note    Name: Helen [name] Date: 06/08/2015 MRN: [id number] DOB: 12/22/1934   Current dose: 10 Gy  Current fraction:4   MEDICATIONS: Current Outpatient Prescriptions  Medication Sig Dispense Refill  . HYDROcodone-acetaminophen (NORCO) 5-325 MG per tablet Take 1 tablet by mouth every 6 (six) hours as needed for moderate pain. 60 tablet 0  . lidocaine-prilocaine (EMLA) cream Apply 1 application topically as needed. 30 g 6  . Rivaroxaban (XARELTO STARTER PACK) 15 & 20 MG TBPK Take as directed on package: Take one 15mg  tablet by mouth twice a day with food. On Day 22, switch to one 20mg  tablet once a day with food. 51 each 0   No current facility-administered medications for this encounter.     ALLERGIES: Codeine; Penicillins; Sulfonamide derivatives; Tape; and Tramadol hcl   LABORATORY DATA:  Lab Results  Component Value Date   WBC 4.8 06/05/2015   HGB 8.5* 06/05/2015   HCT 26.6* 06/05/2015   MCV 79.2* 06/05/2015   PLT 613* 06/05/2015   Lab Results  Component Value Date   NA 136 05/29/2015   K 3.6 05/29/2015   CL 103 01/13/2015   CO2 24 05/29/2015   Lab Results  Component Value Date   ALT 34 05/29/2015   AST 37* 05/29/2015   ALKPHOS 105 05/29/2015   BILITOT 0.60 05/29/2015     NARRATIVE: Helen [name] was seen today for weekly treatment management. The chart was checked and the patient's films were reviewed.  Weekly rad tx 4/10 right neck completed, no skin changes, c/o of mask tightness, and makes her uncomfortable.On left side of neck, no nausea, appetite good stated patient Fatigued, only takes her hydrocodone apap for her arthritis, c.o low back pain, stated she could use her hydrocodone med for back pain, NO I don't want to get addicted".Encourageher to drink plenty fluids and eat smaller meals, pt education done, fatigue, pain,skin irritation, throat  changes, nausea discussed ways to manage them,verbal understanding.   PHYSICAL EXAMINATION: weight is 125 lb (56.7 kg). Her oral temperature is 97.6 F (36.4 C). Her blood pressure is 127/57 and her pulse is 93. Her respiration is 20 and oxygen saturation is 100%.        ASSESSMENT: The patient is doing satisfactorily with treatment.     PLAN: We will continue with the patient's radiation treatment as planned. I have discussed the tightness of the mask with the treatment machine.   ------------------------------------------------  Jodelle Gross, MD, PhD  This document serves as a record of services personally performed by Kyung Rudd, MD. It was created on his behalf by Derek Mound, a trained medical scribe. The creation of this record is based on the scribe's personal observations and the provider's statements to them. This document has been checked and approved by the attending provider.

## 2015-06-08 NOTE — Addendum Note (Signed)
Encounter addended by: Doreen Beam, RN on: 06/08/2015 12:26 PM<BR>     Documentation filed: Notes Section

## 2015-06-11 ENCOUNTER — Ambulatory Visit
Admission: RE | Admit: 2015-06-11 | Discharge: 2015-06-11 | Disposition: A | Discharge status: Home / Self Care | Payer: Medicare Other | Source: Ambulatory Visit | Attending: Radiation Oncology | Admitting: Radiation Oncology

## 2015-06-11 DIAGNOSIS — C7951 Secondary malignant neoplasm of bone: Secondary | ICD-10-CM | POA: Diagnosis not present

## 2015-06-12 ENCOUNTER — Ambulatory Visit
Admission: RE | Admit: 2015-06-12 | Discharge: 2015-06-12 | Disposition: A | Discharge status: Home / Self Care | Payer: Medicare Other | Source: Ambulatory Visit | Attending: Radiation Oncology | Admitting: Radiation Oncology

## 2015-06-12 DIAGNOSIS — C7951 Secondary malignant neoplasm of bone: Secondary | ICD-10-CM | POA: Diagnosis not present

## 2015-06-13 ENCOUNTER — Ambulatory Visit
Admission: RE | Admit: 2015-06-13 | Discharge: 2015-06-13 | Disposition: A | Discharge status: Home / Self Care | Payer: Medicare Other | Source: Ambulatory Visit | Attending: Radiation Oncology | Admitting: Radiation Oncology

## 2015-06-13 DIAGNOSIS — C7951 Secondary malignant neoplasm of bone: Secondary | ICD-10-CM | POA: Diagnosis not present

## 2015-06-14 ENCOUNTER — Ambulatory Visit
Admission: RE | Admit: 2015-06-14 | Discharge: 2015-06-14 | Disposition: A | Discharge status: Home / Self Care | Payer: Medicare Other | Source: Ambulatory Visit | Attending: Radiation Oncology | Admitting: Radiation Oncology

## 2015-06-14 ENCOUNTER — Encounter: Payer: Self-pay | Admitting: Radiation Oncology

## 2015-06-14 VITALS — BP 109/59 | HR 96 | Temp 97.5°F | Resp 20 | Wt 125.9 lb

## 2015-06-14 DIAGNOSIS — C8300 Small cell B-cell lymphoma, unspecified site: Secondary | ICD-10-CM

## 2015-06-14 DIAGNOSIS — C7951 Secondary malignant neoplasm of bone: Secondary | ICD-10-CM | POA: Diagnosis not present

## 2015-06-14 NOTE — Progress Notes (Signed)
  Radiation Oncology         [phone]   Name: Helen [name] MRN: [id number]   Date: 06/14/2015  DOB: 07/10/1935   Weekly Radiation Therapy Management    ICD-9-CM ICD-10-CM   1. Lymphoma, small lymphocytic 200.10 C83.00     Current Dose: 20 Gy  Planned Dose:  25 Gy  Narrative The patient presents for routine under treatment assessment. Completed 8 out of 10 radiation treatments. Pt reports that radiation is miserable and has pain because she has to wear a mask and lays on a "hard" table. No skin changes, but slight swelling on right side face and lymphedema on right arm. Set-up films were reviewed. The chart was checked.  Physical Findings  weight is 125 lb 14.4 oz (57.108 kg). Her oral temperature is 97.5 F (36.4 C). Her blood pressure is 109/59 and her pulse is 96. Her respiration is 20 and oxygen saturation is 100%. . Weight essentially stable.  No significant changes.  Impression The patient is tolerating radiation.  Plan Continue treatment as planned. I advised her to take her pain medication when she goes home.    This document serves as a record of services personally performed by Helen Pita, MD. It was created on his behalf by Helen [name], a trained medical scribe. The creation of this record is based on the scribe's personal observations and the provider's statements to them. This document has been checked and approved by the attending provider.      Helen [name], M.D.

## 2015-06-14 NOTE — Progress Notes (Signed)
Weekly rad tx rt neck, 8/10 completed, no skin changes, but slight swelling on right side face and lymphedema on right arm, doesn't remember where her sleve is for that,, ate boiled egg, pop tart and cranberry juice and water , dislike the mask and hard table, no nausea, states pain 10/10 on face from mask, will take pain med when she gets home 11:03 AM BP 109/59 mmHg  Pulse 96  Temp(Src) 97.5 F (36.4 C) (Oral)  Resp 20  Wt 125 lb 14.4 oz (57.108 kg)  SpO2 100%  Wt Readings from Last 3 Encounters:  06/14/15 125 lb 14.4 oz (57.108 kg)  06/08/15 125 lb (56.7 kg)  05/29/15 125 lb 6.4 oz (56.881 kg)

## 2015-06-15 ENCOUNTER — Ambulatory Visit
Admission: RE | Admit: 2015-06-15 | Discharge: 2015-06-15 | Disposition: A | Discharge status: Home / Self Care | Payer: Medicare Other | Source: Ambulatory Visit | Attending: Radiation Oncology | Admitting: Radiation Oncology

## 2015-06-15 DIAGNOSIS — C7951 Secondary malignant neoplasm of bone: Secondary | ICD-10-CM | POA: Diagnosis not present

## 2015-06-18 ENCOUNTER — Telehealth: Payer: Self-pay | Admitting: Hematology and Oncology

## 2015-06-18 ENCOUNTER — Encounter: Payer: Self-pay | Admitting: Hematology and Oncology

## 2015-06-18 ENCOUNTER — Ambulatory Visit (HOSPITAL_BASED_OUTPATIENT_CLINIC_OR_DEPARTMENT_OTHER): Payer: Medicare Other | Admitting: Hematology and Oncology

## 2015-06-18 ENCOUNTER — Ambulatory Visit
Admission: RE | Admit: 2015-06-18 | Discharge: 2015-06-18 | Disposition: A | Discharge status: Home / Self Care | Payer: Medicare Other | Source: Ambulatory Visit | Attending: Radiation Oncology | Admitting: Radiation Oncology

## 2015-06-18 ENCOUNTER — Encounter: Payer: Self-pay | Admitting: Radiation Oncology

## 2015-06-18 ENCOUNTER — Ambulatory Visit (HOSPITAL_BASED_OUTPATIENT_CLINIC_OR_DEPARTMENT_OTHER): Payer: Medicare Other

## 2015-06-18 VITALS — BP 141/57 | HR 89 | Temp 97.7°F | Resp 18 | Ht 62.0 in | Wt 127.9 lb

## 2015-06-18 DIAGNOSIS — R600 Localized edema: Secondary | ICD-10-CM | POA: Diagnosis not present

## 2015-06-18 DIAGNOSIS — C7951 Secondary malignant neoplasm of bone: Secondary | ICD-10-CM | POA: Diagnosis not present

## 2015-06-18 DIAGNOSIS — C8300 Small cell B-cell lymphoma, unspecified site: Secondary | ICD-10-CM | POA: Diagnosis not present

## 2015-06-18 DIAGNOSIS — D630 Anemia in neoplastic disease: Secondary | ICD-10-CM | POA: Diagnosis not present

## 2015-06-18 DIAGNOSIS — I82C11 Acute embolism and thrombosis of right internal jugular vein: Secondary | ICD-10-CM

## 2015-06-18 DIAGNOSIS — C50912 Malignant neoplasm of unspecified site of left female breast: Secondary | ICD-10-CM

## 2015-06-18 LAB — CBC WITH DIFFERENTIAL/PLATELET
BASO%: 1.4 % (ref 0.0–2.0)
Basophils Absolute: 0 10*3/uL (ref 0.0–0.1)
EOS ABS: 0.2 10*3/uL (ref 0.0–0.5)
EOS%: 6.2 % (ref 0.0–7.0)
HCT: 26.4 % — ABNORMAL LOW (ref 34.8–46.6)
HGB: 8.1 g/dL — ABNORMAL LOW (ref 11.6–15.9)
LYMPH#: 0.4 10*3/uL — AB (ref 0.9–3.3)
LYMPH%: 13 % — ABNORMAL LOW (ref 14.0–49.7)
MCH: 24.7 pg — ABNORMAL LOW (ref 25.1–34.0)
MCHC: 30.7 g/dL — ABNORMAL LOW (ref 31.5–36.0)
MCV: 80.5 fL (ref 79.5–101.0)
MONO#: 0.9 10*3/uL (ref 0.1–0.9)
MONO%: 33 % — ABNORMAL HIGH (ref 0.0–14.0)
NEUT%: 46.4 % (ref 38.4–76.8)
NEUTROS ABS: 1.3 10*3/uL — AB (ref 1.5–6.5)
Platelets: 333 10*3/uL (ref 145–400)
RBC: 3.28 10*6/uL — ABNORMAL LOW (ref 3.70–5.45)
RDW: 19 % — ABNORMAL HIGH (ref 11.2–14.5)
WBC: 2.8 10*3/uL — ABNORMAL LOW (ref 3.9–10.3)
nRBC: 0 % (ref 0–0)

## 2015-06-18 LAB — HOLD TUBE, BLOOD BANK

## 2015-06-18 NOTE — Assessment & Plan Note (Signed)
Her lymphoma is  Improving with radiation treatment We discussed more aggressive treatment for CLL/SLL but she does not want "significant" side-effects I will wait for imaging studies next month and reassess

## 2015-06-18 NOTE — Telephone Encounter (Signed)
per cameo to add to Dr Alvy Bimler sch today @12 -ok to double book-pt here

## 2015-06-18 NOTE — Assessment & Plan Note (Signed)
This is likely due to recent treatment. The patient denies recent history of bleeding such as epistaxis, hematuria or hematochezia. She is asymptomatic from the anemia. I will observe for now.  She does not require transfusion now. 

## 2015-06-18 NOTE — Telephone Encounter (Signed)
Gave avs & calendar for August. Sent to labs.

## 2015-06-18 NOTE — Assessment & Plan Note (Signed)
The right arm swelling is improving. She is doing well with anticoagulation therapy and we will continue the same for now.

## 2015-06-18 NOTE — Assessment & Plan Note (Signed)
This is due to protein calorie malnutrition. I recommend leg elevation and reduced salt intake. I do not recommend starting her on diuretic due to risk of dehydration

## 2015-06-18 NOTE — Progress Notes (Signed)
[place] OFFICE PROGRESS NOTE  Patient Care Team: [name], MD as PCP - General (Hematology and Oncology) [name], MD as Consulting Physician (Hematology and Oncology)  SUMMARY OF ONCOLOGIC HISTORY:   Cancer of left breast   08/03/2008 Initial Diagnosis Breast CA   11/06/2014 Imaging Repeat CT scan of the chest, abdomen and pelvis show regression in the size of liver metastasis and pulmonary metastasis with mild progression of lymphadenopathy   11/06/2014 Tumor Marker [id number] is elevated at 275   01/18/2015 Imaging MRI head is negative for metastatic disease   01/18/2015 Imaging CT scan of the chest, abdomen and pelvis show significant disease progression throughout.   01/25/2015 - 05/15/2015 Chemotherapy She started Gemzar every other week and Ibrutinib. Treatment is discontinued due to progression   02/20/2015 Tumor Marker CA [id number] at 246   03/30/2015 Imaging Repeat CT scan of the chest, abdomen and pelvis show positive response to treatment for lymphoma. Liver lesions are stable.   06/05/2015 - 06/18/2015 Radiation Therapy  she completed radiation treatment to the right supraclavicular region    INTERVAL HISTORY: Please see below for problem oriented charting.  she is seen urgently today because of diffuse swelling. She noticed bilateral lower extremity edema for the past 1 week. Her right upper extremity swelling has improved. She is compliant taking her medications. The patient denies any recent signs or symptoms of bleeding such as spontaneous epistaxis, hematuria or hematochezia.  The right neck lymphadenopathy is reduced and she just completed radiation treatment today  REVIEW OF SYSTEMS:   Constitutional: Denies fevers, chills or abnormal weight loss Eyes: Denies blurriness of vision Ears, nose, mouth, throat, and face: Denies mucositis or sore throat Respiratory: Denies cough, dyspnea or wheezes Cardiovascular: Denies palpitation, chest discomfort   Gastrointestinal:  Denies nausea, heartburn or change in bowel habits Skin: Denies abnormal skin rashes Lymphatics: Denies new lymphadenopathy or easy bruising Neurological:Denies numbness, tingling or new weaknesses Behavioral/Psych: Mood is stable, no new changes  All other systems were reviewed with the patient and are negative.  I have reviewed the past medical history, past surgical history, social history and family history with the patient and they are unchanged from previous note.  ALLERGIES:  is allergic to codeine; penicillins; sulfonamide derivatives; tape; and tramadol hcl.  MEDICATIONS:  Current Outpatient Prescriptions  Medication Sig Dispense Refill  . HYDROcodone-acetaminophen (NORCO) 5-325 MG per tablet Take 1 tablet by mouth every 6 (six) hours as needed for moderate pain. 60 tablet 0  . Rivaroxaban (XARELTO STARTER PACK) 15 & 20 MG TBPK Take as directed on package: Take one 15mg  tablet by mouth twice a day with food. On Day 22, switch to one 20mg  tablet once a day with food. 51 each 0  . lidocaine-prilocaine (EMLA) cream Apply 1 application topically as needed. (Patient not taking: Reported on 06/18/2015) 30 g 6   No current facility-administered medications for this visit.    PHYSICAL EXAMINATION: ECOG PERFORMANCE STATUS: 1 - Symptomatic but completely ambulatory  Filed Vitals:   06/18/15 1139  BP: 141/57  Pulse: 89  Temp: 97.7 F (36.5 C)  Resp: 18   Filed Weights   06/18/15 1139  Weight: 127 lb 14.4 oz (58.015 kg)    GENERAL:alert, no distress and comfortable SKIN: skin color, texture, turgor are normal, no rashes or significant lesions EYES: normal, Conjunctiva are  pale and non-injected, sclera clear OROPHARYNX:no exudate, no erythema and lips, buccal mucosa, and tongue normal  NECK: supple, thyroid normal size, non-tender,  without nodularity LYMPH:   Her neck lymphadenopathy has regressed in size since I saw her. LUNGS: clear to auscultation and  percussion with normal breathing effort HEART: regular rate & rhythm and no murmurs with bilateral lower extremity edema ABDOMEN:abdomen soft, non-tender and normal bowel sounds Musculoskeletal:no cyanosis of digits and no clubbing  NEURO: alert & oriented x 3 with fluent speech, no focal motor/sensory deficits  LABORATORY DATA:  I have reviewed the data as listed    Component Value Date/Time   NA 136 05/29/2015 1059   NA 139 01/13/2015 1730   NA 142 06/01/2012 1008   K 3.6 05/29/2015 1059   K 4.0 01/13/2015 1730   K 3.8 06/01/2012 1008   CL 103 01/13/2015 1730   CL 107 04/22/2013 0922   CL 103 06/01/2012 1008   CO2 24 05/29/2015 1059   CO2 25 06/13/2014 0819   CO2 25 06/01/2012 1008   GLUCOSE 75 05/29/2015 1059   GLUCOSE 93 01/13/2015 1730   GLUCOSE 89 04/22/2013 0922   GLUCOSE 108 06/01/2012 1008   BUN 15.8 05/29/2015 1059   BUN 12 01/13/2015 1730   BUN 9 06/01/2012 1008   CREATININE 0.7 05/29/2015 1059   CREATININE 0.80 01/13/2015 1730   CREATININE 0.9 06/01/2012 1008   CALCIUM 9.0 05/29/2015 1059   CALCIUM 10.8* 06/13/2014 0819   CALCIUM 9.8 06/01/2012 1008   PROT 6.2* 05/29/2015 1059   PROT 7.5 06/13/2014 0819   PROT 8.1 08/14/2010 1251   ALBUMIN 2.5* 05/29/2015 1059   ALBUMIN 3.9 06/13/2014 0819   AST 37* 05/29/2015 1059   AST 23 06/13/2014 0819   AST 19 08/14/2010 1251   ALT 34 05/29/2015 1059   ALT 13 06/13/2014 0819   ALT 9* 08/14/2010 1251   ALKPHOS 105 05/29/2015 1059   ALKPHOS 89 06/13/2014 0819   ALKPHOS 78 08/14/2010 1251   BILITOT 0.60 05/29/2015 1059   BILITOT 0.2* 06/13/2014 0819   BILITOT 0.40 08/14/2010 1251   GFRNONAA 87* 07/06/2012 1122   GFRAA >90 07/06/2012 1122    No results found for: SPEP, UPEP  Lab Results  Component Value Date   WBC 2.8* 06/18/2015   NEUTROABS 1.3* 06/18/2015   HGB 8.1* 06/18/2015   HCT 26.4* 06/18/2015   MCV 80.5 06/18/2015   PLT 333 06/18/2015      Chemistry      Component Value Date/Time   NA 136  05/29/2015 1059   NA 139 01/13/2015 1730   NA 142 06/01/2012 1008   K 3.6 05/29/2015 1059   K 4.0 01/13/2015 1730   K 3.8 06/01/2012 1008   CL 103 01/13/2015 1730   CL 107 04/22/2013 0922   CL 103 06/01/2012 1008   CO2 24 05/29/2015 1059   CO2 25 06/13/2014 0819   CO2 25 06/01/2012 1008   BUN 15.8 05/29/2015 1059   BUN 12 01/13/2015 1730   BUN 9 06/01/2012 1008   CREATININE 0.7 05/29/2015 1059   CREATININE 0.80 01/13/2015 1730   CREATININE 0.9 06/01/2012 1008      Component Value Date/Time   CALCIUM 9.0 05/29/2015 1059   CALCIUM 10.8* 06/13/2014 0819   CALCIUM 9.8 06/01/2012 1008   ALKPHOS 105 05/29/2015 1059   ALKPHOS 89 06/13/2014 0819   ALKPHOS 78 08/14/2010 1251   AST 37* 05/29/2015 1059   AST 23 06/13/2014 0819   AST 19 08/14/2010 1251   ALT 34 05/29/2015 1059   ALT 13 06/13/2014 0819   ALT 9* 08/14/2010 1251  BILITOT 0.60 05/29/2015 1059   BILITOT 0.2* 06/13/2014 0819   BILITOT 0.40 08/14/2010 1251      ASSESSMENT & PLAN:  Cancer of left breast Clinically, she is not doing well. She tolerated chemotherapy very poorly and started become noncompliant. The breast cancer tumor marker fluctuated in the past. She is getting very anemic. I have ordered a staging CT scan next month for further assessment     Lymphoma, small lymphocytic Her lymphoma is  Improving with radiation treatment We discussed more aggressive treatment for CLL/SLL but she does not want "significant" side-effects I will wait for imaging studies next month and reassess  Anemia in neoplastic disease This is likely due to recent treatment. The patient denies recent history of bleeding such as epistaxis, hematuria or hematochezia. She is asymptomatic from the anemia. I will observe for now.  She does not require transfusion now.    Bilateral leg edema  This is due to protein calorie malnutrition. I recommend leg elevation and reduced salt intake. I do not recommend starting her on diuretic  due to risk of dehydration  Acute thrombosis of right internal jugular vein  The right arm swelling is improving. She is doing well with anticoagulation therapy and we will continue the same for now.   No orders of the defined types were placed in this encounter.   All questions were answered. The patient knows to call the clinic with any problems, questions or concerns. No barriers to learning was detected. I spent 20 minutes counseling the patient face to face. The total time spent in the appointment was 30 minutes and more than 50% was on counseling and review of test results     [facility], Moran, MD 06/18/2015 4:34 PM

## 2015-06-18 NOTE — Assessment & Plan Note (Signed)
Clinically, she is not doing well. She tolerated chemotherapy very poorly and started become noncompliant. The breast cancer tumor marker fluctuated in the past. She is getting very anemic. I have ordered a staging CT scan next month for further assessment

## 2015-06-20 ENCOUNTER — Telehealth: Payer: Self-pay | Admitting: *Deleted

## 2015-06-20 NOTE — Telephone Encounter (Signed)
Can you make sure she has the 20 mg dose?

## 2015-06-20 NOTE — Telephone Encounter (Signed)
Pt called requesting call back from nurse.  Spoke with pt and was informed that pt had taken last dose of Xarelto 15 mg this am.  Pt wanted to know when she should start Xarelto 20 mg daily.   Spoke with Carolann Littler, pharmacist, pt can start Xarelto 20 mg daily tomorrow 06/21/15. Spoke with pt and informed pt of above instructions from pharmacist.  Confirmed with pt appts for CT scan and lab on 8/19;  offive visit with Dr. Alvy Bimler on 8/30. Pt's   Phone    [phone].

## 2015-06-20 NOTE — Telephone Encounter (Signed)
Confirmed 20mg  Xarelto

## 2015-06-20 NOTE — Telephone Encounter (Signed)
Pt confirmed that she does have Xarelto 20 mg at home.

## 2015-06-28 ENCOUNTER — Ambulatory Visit: Payer: Self-pay | Admitting: Hematology and Oncology

## 2015-07-02 ENCOUNTER — Other Ambulatory Visit (HOSPITAL_BASED_OUTPATIENT_CLINIC_OR_DEPARTMENT_OTHER): Payer: Medicare Other

## 2015-07-02 ENCOUNTER — Ambulatory Visit (HOSPITAL_COMMUNITY)
Admission: RE | Admit: 2015-07-02 | Discharge: 2015-07-02 | Disposition: A | Discharge status: Home / Self Care | Payer: Medicare Other | Source: Ambulatory Visit | Attending: Hematology and Oncology | Admitting: Hematology and Oncology

## 2015-07-02 ENCOUNTER — Telehealth: Payer: Self-pay | Admitting: *Deleted

## 2015-07-02 ENCOUNTER — Ambulatory Visit (HOSPITAL_BASED_OUTPATIENT_CLINIC_OR_DEPARTMENT_OTHER): Payer: Medicare Other

## 2015-07-02 ENCOUNTER — Other Ambulatory Visit: Payer: Self-pay | Admitting: Hematology and Oncology

## 2015-07-02 VITALS — BP 104/43 | HR 69 | Temp 97.7°F | Resp 18

## 2015-07-02 DIAGNOSIS — C8300 Small cell B-cell lymphoma, unspecified site: Secondary | ICD-10-CM | POA: Diagnosis present

## 2015-07-02 DIAGNOSIS — D739 Disease of spleen, unspecified: Secondary | ICD-10-CM | POA: Diagnosis not present

## 2015-07-02 DIAGNOSIS — D630 Anemia in neoplastic disease: Secondary | ICD-10-CM

## 2015-07-02 DIAGNOSIS — C50912 Malignant neoplasm of unspecified site of left female breast: Secondary | ICD-10-CM

## 2015-07-02 DIAGNOSIS — K8020 Calculus of gallbladder without cholecystitis without obstruction: Secondary | ICD-10-CM | POA: Insufficient documentation

## 2015-07-02 DIAGNOSIS — J90 Pleural effusion, not elsewhere classified: Secondary | ICD-10-CM | POA: Insufficient documentation

## 2015-07-02 DIAGNOSIS — C7951 Secondary malignant neoplasm of bone: Secondary | ICD-10-CM

## 2015-07-02 LAB — COMPREHENSIVE METABOLIC PANEL (CC13)
ALT: 28 U/L (ref 0–55)
AST: 30 U/L (ref 5–34)
Albumin: 2.9 g/dL — ABNORMAL LOW (ref 3.5–5.0)
Alkaline Phosphatase: 88 U/L (ref 40–150)
Anion Gap: 10 mEq/L (ref 3–11)
BUN: 9.5 mg/dL (ref 7.0–26.0)
CALCIUM: 10 mg/dL (ref 8.4–10.4)
CO2: 26 mEq/L (ref 22–29)
Chloride: 103 mEq/L (ref 98–109)
Creatinine: 0.7 mg/dL (ref 0.6–1.1)
Glucose: 91 mg/dl (ref 70–140)
POTASSIUM: 4.4 meq/L (ref 3.5–5.1)
SODIUM: 139 meq/L (ref 136–145)
Total Bilirubin: 0.39 mg/dL (ref 0.20–1.20)
Total Protein: 6.4 g/dL (ref 6.4–8.3)

## 2015-07-02 LAB — CBC WITH DIFFERENTIAL/PLATELET
BASO%: 0.1 % (ref 0.0–2.0)
Basophils Absolute: 0 10*3/uL (ref 0.0–0.1)
EOS%: 5.2 % (ref 0.0–7.0)
Eosinophils Absolute: 0.2 10*3/uL (ref 0.0–0.5)
HCT: 25 % — ABNORMAL LOW (ref 34.8–46.6)
HGB: 7.9 g/dL — ABNORMAL LOW (ref 11.6–15.9)
LYMPH%: 7.4 % — AB (ref 14.0–49.7)
MCH: 24.2 pg — ABNORMAL LOW (ref 25.1–34.0)
MCHC: 31.4 g/dL — ABNORMAL LOW (ref 31.5–36.0)
MCV: 76.9 fL — AB (ref 79.5–101.0)
MONO#: 1.1 10*3/uL — ABNORMAL HIGH (ref 0.1–0.9)
MONO%: 37.1 % — AB (ref 0.0–14.0)
NEUT%: 50.2 % (ref 38.4–76.8)
NEUTROS ABS: 1.5 10*3/uL (ref 1.5–6.5)
Platelets: 423 10*3/uL — ABNORMAL HIGH (ref 145–400)
RBC: 3.25 10*6/uL — AB (ref 3.70–5.45)
RDW: 19.9 % — ABNORMAL HIGH (ref 11.2–14.5)
WBC: 3.1 10*3/uL — AB (ref 3.9–10.3)
lymph#: 0.2 10*3/uL — ABNORMAL LOW (ref 0.9–3.3)

## 2015-07-02 LAB — CANCER ANTIGEN 27.29: CA 27.29: 188 U/mL — AB (ref 0–39)

## 2015-07-02 LAB — HOLD TUBE, BLOOD BANK

## 2015-07-02 LAB — PREPARE RBC (CROSSMATCH)

## 2015-07-02 MED ORDER — DIPHENHYDRAMINE HCL 25 MG PO CAPS
25.0000 mg | ORAL_CAPSULE | Freq: Once | ORAL | Status: AC
  Administered 2015-07-02: 25 mg via ORAL

## 2015-07-02 MED ORDER — SODIUM CHLORIDE 0.9 % IJ SOLN
3.0000 mL | INTRAMUSCULAR | Status: DC | PRN
  Filled 2015-07-02: qty 10

## 2015-07-02 MED ORDER — ACETAMINOPHEN 325 MG PO TABS
ORAL_TABLET | ORAL | Status: AC
  Filled 2015-07-02: qty 2

## 2015-07-02 MED ORDER — IOHEXOL 300 MG/ML  SOLN
100.0000 mL | Freq: Once | INTRAMUSCULAR | Status: AC | PRN
  Administered 2015-07-02: 100 mL via INTRAVENOUS

## 2015-07-02 MED ORDER — SODIUM CHLORIDE 0.9 % IV SOLN
250.0000 mL | Freq: Once | INTRAVENOUS | Status: AC
  Administered 2015-07-02: 250 mL via INTRAVENOUS

## 2015-07-02 MED ORDER — ACETAMINOPHEN 325 MG PO TABS
650.0000 mg | ORAL_TABLET | Freq: Once | ORAL | Status: AC
  Administered 2015-07-02: 650 mg via ORAL

## 2015-07-02 MED ORDER — HEPARIN SOD (PORK) LOCK FLUSH 100 UNIT/ML IV SOLN
250.0000 [IU] | INTRAVENOUS | Status: DC | PRN
  Filled 2015-07-02: qty 5

## 2015-07-02 MED ORDER — SODIUM CHLORIDE 0.9 % IJ SOLN
10.0000 mL | INTRAMUSCULAR | Status: AC | PRN
  Administered 2015-07-02: 10 mL
  Filled 2015-07-02: qty 10

## 2015-07-02 MED ORDER — DIPHENHYDRAMINE HCL 25 MG PO CAPS
ORAL_CAPSULE | ORAL | Status: AC
  Filled 2015-07-02: qty 1

## 2015-07-02 MED ORDER — HEPARIN SOD (PORK) LOCK FLUSH 100 UNIT/ML IV SOLN
500.0000 [IU] | Freq: Every day | INTRAVENOUS | Status: AC | PRN
  Administered 2015-07-02: 500 [IU]
  Filled 2015-07-02: qty 5

## 2015-07-02 NOTE — Telephone Encounter (Signed)
THIS REPORT WAS CALLED AND FAXED. GIVEN OR PLACED ON DR.GORSUCH'S DESK.

## 2015-07-02 NOTE — Patient Instructions (Signed)

## 2015-07-03 ENCOUNTER — Encounter: Payer: Self-pay | Admitting: Hematology and Oncology

## 2015-07-03 ENCOUNTER — Ambulatory Visit (HOSPITAL_BASED_OUTPATIENT_CLINIC_OR_DEPARTMENT_OTHER): Payer: Medicare Other | Admitting: Hematology and Oncology

## 2015-07-03 VITALS — BP 121/62 | HR 89 | Temp 98.1°F | Resp 18 | Ht 62.0 in | Wt 121.1 lb

## 2015-07-03 DIAGNOSIS — C7951 Secondary malignant neoplasm of bone: Secondary | ICD-10-CM

## 2015-07-03 DIAGNOSIS — I82C11 Acute embolism and thrombosis of right internal jugular vein: Secondary | ICD-10-CM

## 2015-07-03 DIAGNOSIS — D630 Anemia in neoplastic disease: Secondary | ICD-10-CM

## 2015-07-03 DIAGNOSIS — C50912 Malignant neoplasm of unspecified site of left female breast: Secondary | ICD-10-CM | POA: Diagnosis present

## 2015-07-03 DIAGNOSIS — C8300 Small cell B-cell lymphoma, unspecified site: Secondary | ICD-10-CM

## 2015-07-03 LAB — TYPE AND SCREEN
ABO/RH(D): O POS
ANTIBODY SCREEN: NEGATIVE
Unit division: 0

## 2015-07-03 MED ORDER — RIVAROXABAN 20 MG PO TABS: 20.0000 mg | ORAL_TABLET | Freq: Every day | ORAL | Status: DC

## 2015-07-03 NOTE — Assessment & Plan Note (Signed)
The radiation is very helpful to reduce right neck lymph node swelling and right upper extremity swelling. Unfortunately, CT scan showed widespread disease. I suspect the splenic lesion is from lymphoma. She has progressed on 4 different lines of chemotherapy. She wants treatment with something "simple" that would not make her sick. Again, I told the patient she has very unrealistic expectation. I felt that she may have high-grade lymphoma given rapid disease progression in the last few months. I recommend consideration for mini [facility] chemotherapy but she does not want to hear about potential side effects including hair loss, nausea, vomiting, increased risk of infection, neuropathy and others. I spent a lot of time talking over the telephone with her son. The patient also has very poor compliance and has stopped taking Xarelto on her own. I recommend consideration for home healthcare nurse or palliative care nurse for home visit but the patient declined. When I asked the patient about expectation, she wants to live for another 10 years feeling well without any side effects of treatment. I did not schedule any return appointment because I cannot meet her expectation.

## 2015-07-03 NOTE — Assessment & Plan Note (Signed)
From swelling has improved since radiation treatment. She has not taking any Xarelto recently. When I questioned about that, she does not understand the reason for her to be on Xarelto. I reinforced the importance of compliance and refill her medication to her pharmacy.

## 2015-07-03 NOTE — Progress Notes (Signed)
[facility] OFFICE PROGRESS NOTE  Patient Care Team: [name], MD as PCP - General (Hematology and Oncology) [name], MD as Consulting Physician (Hematology and Oncology)  SUMMARY OF ONCOLOGIC HISTORY:   Cancer of left breast   08/03/2008 Initial Diagnosis Breast CA   11/06/2014 Imaging Repeat CT scan of the chest, abdomen and pelvis show regression in the size of liver metastasis and pulmonary metastasis with mild progression of lymphadenopathy   11/06/2014 Tumor Marker [id number] is elevated at 275   01/18/2015 Imaging MRI head is negative for metastatic disease   01/18/2015 Imaging CT scan of the chest, abdomen and pelvis show significant disease progression throughout.   01/25/2015 - 05/15/2015 Chemotherapy She started Gemzar every other week and Ibrutinib. Treatment is discontinued due to progression   02/20/2015 Tumor Marker CA 27-29 at 246   03/30/2015 Imaging Repeat CT scan of the chest, abdomen and pelvis show positive response to treatment for lymphoma. Liver lesions are stable.   06/05/2015 - 06/18/2015 Radiation Therapy  she completed radiation treatment to the right supraclavicular region   #1 left breast carcinoma originally presenting as a fungating mass and was diagnosed with metastatic breast cancer in October 2009. Patient underwent neoadjuvant chemotherapy followed by mastectomy. She is currently on Arimidex 1mg  daily and monthly Xgeva. 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 She received 6 cycles of CVP and Rituxan, followed by maintenance Rituxan. Later, her disease recurred and underwent 4 cycles of R-Bendamustine. PET/CT showed CR and then she proceeded to maintenance Rituxan.  #3 In April 2015, repeat CT scan show new lymphadenopathy. #4 on 04/03/2014, axillary lymph node dissection showed recurrence of CLL/SLL #5 On 04/18/14: She was started on Obinutuzumab #6 on  08/04/2014, repeat CT scan showed improvement of disease contour of the right axilla but with progression of liver metastasis & lung metastasis #7 on 08/15/2014, she was started on Faslodex. INTERVAL HISTORY: Please see below for problem oriented charting. She returns for further follow-up. She has been complaining of discomfort from recent radiation. She have poor appetite. She stopped taking her Xarelto when she ran out and could not understand why she needs to continue taking it. She received blood transfusion recently. She complained of fatigue.  REVIEW OF SYSTEMS:   Constitutional: Denies fevers, chills or abnormal weight loss Eyes: Denies blurriness of vision Ears, nose, mouth, throat, and face: Denies mucositis or sore throat Respiratory: Denies cough, dyspnea or wheezes Cardiovascular: Denies palpitation, chest discomfort or lower extremity swelling Gastrointestinal:  Denies nausea, heartburn or change in bowel habits Skin: Denies abnormal skin rashes Lymphatics: Denies new lymphadenopathy or easy bruising Neurological:Denies numbness, tingling or new weaknesses Behavioral/Psych: Mood is stable, no new changes  All other systems were reviewed with the patient and are negative.  I have reviewed the past medical history, past surgical history, social history and family history with the patient and they are unchanged from previous note.  ALLERGIES:  is allergic to codeine; penicillins; sulfonamide derivatives; tape; and tramadol hcl.  MEDICATIONS:  Current Outpatient Prescriptions  Medication Sig Dispense Refill  . HYDROcodone-acetaminophen (NORCO) 5-325 MG per tablet Take 1 tablet by mouth every 6 (six) hours as needed for moderate pain. 60 tablet 0  . lidocaine-prilocaine (EMLA) cream Apply 1 application topically as needed. 30 g 6  . rivaroxaban (XARELTO) 20 MG TABS tablet Take 1 tablet (20 mg total) by mouth daily with supper. 30 tablet 6   No  current facility-administered  medications for this visit.    PHYSICAL EXAMINATION: ECOG PERFORMANCE STATUS: 1 - Symptomatic but completely ambulatory  Filed Vitals:   07/03/15 1114  BP: 121/62  Pulse: 89  Temp: 98.1 F (36.7 C)  Resp: 18   Filed Weights   07/03/15 1114  Weight: 121 lb 1.6 oz (54.931 kg)    GENERAL:alert, no distress and comfortable. She looks thin and cachectic SKIN: skin color, texture, turgor are normal, no rashes or significant lesions EYES: normal, Conjunctiva are pale and non-injected, sclera clear Musculoskeletal:no cyanosis of digits and no clubbing  NEURO: alert & oriented x 3 with fluent speech, no focal motor/sensory deficits  LABORATORY DATA:  I have reviewed the data as listed    Component Value Date/Time   NA 139 07/02/2015 0843   NA 139 01/13/2015 1730   NA 142 06/01/2012 1008   K 4.4 07/02/2015 0843   K 4.0 01/13/2015 1730   K 3.8 06/01/2012 1008   CL 103 01/13/2015 1730   CL 107 04/22/2013 0922   CL 103 06/01/2012 1008   CO2 26 07/02/2015 0843   CO2 25 06/13/2014 0819   CO2 25 06/01/2012 1008   GLUCOSE 91 07/02/2015 0843   GLUCOSE 93 01/13/2015 1730   GLUCOSE 89 04/22/2013 0922   GLUCOSE 108 06/01/2012 1008   BUN 9.5 07/02/2015 0843   BUN 12 01/13/2015 1730   BUN 9 06/01/2012 1008   CREATININE 0.7 07/02/2015 0843   CREATININE 0.80 01/13/2015 1730   CREATININE 0.9 06/01/2012 1008   CALCIUM 10.0 07/02/2015 0843   CALCIUM 10.8* 06/13/2014 0819   CALCIUM 9.8 06/01/2012 1008   PROT 6.4 07/02/2015 0843   PROT 7.5 06/13/2014 0819   PROT 8.1 08/14/2010 1251   ALBUMIN 2.9* 07/02/2015 0843   ALBUMIN 3.9 06/13/2014 0819   AST 30 07/02/2015 0843   AST 23 06/13/2014 0819   AST 19 08/14/2010 1251   ALT 28 07/02/2015 0843   ALT 13 06/13/2014 0819   ALT 9* 08/14/2010 1251   ALKPHOS 88 07/02/2015 0843   ALKPHOS 89 06/13/2014 0819   ALKPHOS 78 08/14/2010 1251   BILITOT 0.39 07/02/2015 0843   BILITOT 0.2* 06/13/2014 0819   BILITOT 0.40 08/14/2010 1251   GFRNONAA  87* 07/06/2012 1122   GFRAA >90 07/06/2012 1122    No results found for: SPEP, UPEP  Lab Results  Component Value Date   WBC 3.1* 07/02/2015   NEUTROABS 1.5 07/02/2015   HGB 7.9* 07/02/2015   HCT 25.0* 07/02/2015   MCV 76.9* 07/02/2015   PLT 423* 07/02/2015      Chemistry      Component Value Date/Time   NA 139 07/02/2015 0843   NA 139 01/13/2015 1730   NA 142 06/01/2012 1008   K 4.4 07/02/2015 0843   K 4.0 01/13/2015 1730   K 3.8 06/01/2012 1008   CL 103 01/13/2015 1730   CL 107 04/22/2013 0922   CL 103 06/01/2012 1008   CO2 26 07/02/2015 0843   CO2 25 06/13/2014 0819   CO2 25 06/01/2012 1008   BUN 9.5 07/02/2015 0843   BUN 12 01/13/2015 1730   BUN 9 06/01/2012 1008   CREATININE 0.7 07/02/2015 0843   CREATININE 0.80 01/13/2015 1730   CREATININE 0.9 06/01/2012 1008      Component Value Date/Time   CALCIUM 10.0 07/02/2015 0843   CALCIUM 10.8* 06/13/2014 0819   CALCIUM 9.8 06/01/2012 1008   ALKPHOS 88 07/02/2015 0843   ALKPHOS 89  06/13/2014 0819   ALKPHOS 78 08/14/2010 1251   AST 30 07/02/2015 0843   AST 23 06/13/2014 0819   AST 19 08/14/2010 1251   ALT 28 07/02/2015 0843   ALT 13 06/13/2014 0819   ALT 9* 08/14/2010 1251   BILITOT 0.39 07/02/2015 0843   BILITOT 0.2* 06/13/2014 0819   BILITOT 0.40 08/14/2010 1251       RADIOGRAPHIC STUDIES: I have personally reviewed the radiological images as listed and agreed with the findings in the report. Ct Chest W Contrast  07/02/2015   CLINICAL DATA:  80 year old female with history of left-sided breast cancer diagnosed in 2009 and lymphoma/leukemia diagnosed in 2012, status post left-sided mastectomy and radiation therapy with ongoing chemotherapy. Restaging examination.  EXAM: CT CHEST, ABDOMEN, AND PELVIS WITH CONTRAST  TECHNIQUE: Multidetector CT imaging of the chest, abdomen and pelvis was performed following the standard protocol during bolus administration of intravenous contrast.  CONTRAST:  175mL OMNIPAQUE  IOHEXOL 300 MG/ML  SOLN  COMPARISON:  CT of the chest, abdomen and pelvis 03/30/2015.  FINDINGS: CT CHEST FINDINGS  Mediastinum/Lymph Nodes: Heart size is borderline enlarged. There is no significant pericardial fluid, thickening or pericardial calcification. Multiple mildly enlarged right peritracheal lymph nodes measuring up to 11 mm in short axis appear increased in size compared to the prior study. No definite hilar lymphadenopathy. Esophagus is unremarkable in appearance. No left axillary lymphadenopathy. In the right axilla and subpectoral region there are multiple enlarged lymph nodes which have significantly increased in size compared to the prior study, with the largest node measuring up to 3.3 cm in short axis (image 22 of series 2).  Lungs/Pleura: Again noted is a large mass-like area of airspace consolidation in the left lower lobe which appears slightly increased in size compared to the prior study, estimated to measure approximately 6.0 x 3.8 cm on today's study (image 35 of series 4), concerning for an area of pulmonary lymphomatous involvement given its persistence compared to the prior study. Subpleural reticulation throughout the anterior aspect of the left upper lobe immediately deep to the left breast is presumably related to chronic postradiation fibrosis, as are the fibrotic changes in the apex of the left upper lobe. Bandlike atelectasis in the inferior aspect of the right lower lobe is unchanged. Small right pleural effusion lying dependently is new compared to the prior study. Previously noted small left pleural effusion has resolved.  Musculoskeletal/Soft Tissues: Status post left modified radical mastectomy. Large lucent lesion in the right humeral head/neck, concerning for a metastatic lesion, similar in retrospect to prior studies.  CT ABDOMEN AND PELVIS FINDINGS  Hepatobiliary: Multiple previously noted hypovascular hepatic lesions appear increased in number and size compared to prior  examinations, with the largest of these lesions including a 5.5 x 3.8 cm lesion in the inferior aspect of the right lobe of the liver (image 52 of series 2), which previously measured only 2.4 x 3.6 cm on 03/30/2015. Another enlarging lesion is a 2.6 x 2.1 cm hypovascular lesion in the central aspect of segment 4A of the liver (image 41 of series 2), which previously measured only 1.7 cm. New 2.9 x 1.6 cm hypovascular lesion in segment 3 of the liver (image 56 of series 2). No intra or extrahepatic biliary ductal dilatation. Large 2.4 cm gallstone with faint rim calcification in the gallbladder. No current findings to suggest an acute cholecystitis at this time.  Pancreas: No pancreatic mass. No pancreatic ductal dilatation. No pancreatic or peripancreatic fluid or inflammatory changes.  Spleen: New 1.1 cm hypovascular lesion in the spleen (image 47 of series 2).  Adrenals/Urinary Tract: Tiny calcification in the medial limb of the right adrenal gland is unchanged, presumably related to prior infection or hemorrhage. Left adrenal gland is normal in appearance. Multiple sub cm low-attenuation lesions in the kidneys bilaterally are too small to definitively characterize, but are similar to prior studies, favored to represent tiny cysts. In addition, it is in the lower pole of the left kidney there is a 1.6 cm well-defined low-attenuation lesion with a thin internal septation, compatible with a Bosniak class 2 cyst. No hydroureteronephrosis. Urinary bladder is normal in appearance.  Stomach/Bowel: The appearance of the stomach is normal. No pathologic dilatation of small bowel or colon.  Vascular/Lymphatic: Atherosclerosis throughout the abdominal and pelvic vasculature, without evidence of aneurysm or dissection. Numerous enlarged lymph nodes throughout the abdomen and pelvis measuring up to 11 mm in the left para-aortic nodal station, 16 mm in short axis in the left external iliac nodal station, and 14 mm in short  axis in the left inguinal region. These all appear larger than prior study 03/30/2015.  Reproductive: Heterogeneously enlarged and heterogeneously calcified uterus, compatible with a fibroid uterus. Ovaries are atrophic.  Other: Tiny paraumbilical hernia containing only a small amount of omental fat. No significant volume of ascites. No pneumoperitoneum.  Musculoskeletal: Numerous predominantly lytic lesions noted throughout the pelvis and spine, some of which show some interval sclerosis (best typified by a 2.7 x 1.8 cm lesion in the right iliac crest which is more sclerotic than the prior study), suggesting some interval healing. Large lytic lesion in the posterior aspect of the L4 vertebral body measuring 3.3 x 2.3 cm with some posterior extension into the anterior epidural space, similar to the prior examination (image 66 of series 2). Bilateral pars defects at L5 with 10 mm of anterolisthesis of L5 upon S1.  IMPRESSION: 1. Today's study demonstrates progression of disease, with worsening lymphadenopathy throughout the chest, abdomen and pelvis, as detailed above. In addition, there is an increased number and size of numerous hepatic lesions, a new splenic lesion, and persistence of a mass-like opacity in the left lower lobe which is concerning for probable lymphomatous involvement in the lung. 2. Numerous previously noted osseous lesions appear generally similar to the prior study, with some evidence of interval bony healing in certain lesions, as discussed above. 3. Previously noted small left pleural effusion has resolved. However, there is a new small right-sided pleural effusion layering dependently. 4. Cholelithiasis without evidence of acute cholecystitis at this time. 5. Additional incidental findings, as above. These results will be called to the ordering clinician or representative by the Radiologist Assistant, and communication documented in the PACS or zVision Dashboard.   Electronically Signed   By:  Vinnie Langton M.D.   On: 07/02/2015 11:25   Ct Abdomen Pelvis W Contrast  07/02/2015   CLINICAL DATA:  80 year old female with history of left-sided breast cancer diagnosed in 2009 and lymphoma/leukemia diagnosed in 2012, status post left-sided mastectomy and radiation therapy with ongoing chemotherapy. Restaging examination.  EXAM: CT CHEST, ABDOMEN, AND PELVIS WITH CONTRAST  TECHNIQUE: Multidetector CT imaging of the chest, abdomen and pelvis was performed following the standard protocol during bolus administration of intravenous contrast.  CONTRAST:  162mL OMNIPAQUE IOHEXOL 300 MG/ML  SOLN  COMPARISON:  CT of the chest, abdomen and pelvis 03/30/2015.  FINDINGS: CT CHEST FINDINGS  Mediastinum/Lymph Nodes: Heart size is borderline enlarged. There is no significant pericardial  fluid, thickening or pericardial calcification. Multiple mildly enlarged right peritracheal lymph nodes measuring up to 11 mm in short axis appear increased in size compared to the prior study. No definite hilar lymphadenopathy. Esophagus is unremarkable in appearance. No left axillary lymphadenopathy. In the right axilla and subpectoral region there are multiple enlarged lymph nodes which have significantly increased in size compared to the prior study, with the largest node measuring up to 3.3 cm in short axis (image 22 of series 2).  Lungs/Pleura: Again noted is a large mass-like area of airspace consolidation in the left lower lobe which appears slightly increased in size compared to the prior study, estimated to measure approximately 6.0 x 3.8 cm on today's study (image 35 of series 4), concerning for an area of pulmonary lymphomatous involvement given its persistence compared to the prior study. Subpleural reticulation throughout the anterior aspect of the left upper lobe immediately deep to the left breast is presumably related to chronic postradiation fibrosis, as are the fibrotic changes in the apex of the left upper lobe.  Bandlike atelectasis in the inferior aspect of the right lower lobe is unchanged. Small right pleural effusion lying dependently is new compared to the prior study. Previously noted small left pleural effusion has resolved.  Musculoskeletal/Soft Tissues: Status post left modified radical mastectomy. Large lucent lesion in the right humeral head/neck, concerning for a metastatic lesion, similar in retrospect to prior studies.  CT ABDOMEN AND PELVIS FINDINGS  Hepatobiliary: Multiple previously noted hypovascular hepatic lesions appear increased in number and size compared to prior examinations, with the largest of these lesions including a 5.5 x 3.8 cm lesion in the inferior aspect of the right lobe of the liver (image 52 of series 2), which previously measured only 2.4 x 3.6 cm on 03/30/2015. Another enlarging lesion is a 2.6 x 2.1 cm hypovascular lesion in the central aspect of segment 4A of the liver (image 41 of series 2), which previously measured only 1.7 cm. New 2.9 x 1.6 cm hypovascular lesion in segment 3 of the liver (image 56 of series 2). No intra or extrahepatic biliary ductal dilatation. Large 2.4 cm gallstone with faint rim calcification in the gallbladder. No current findings to suggest an acute cholecystitis at this time.  Pancreas: No pancreatic mass. No pancreatic ductal dilatation. No pancreatic or peripancreatic fluid or inflammatory changes.  Spleen: New 1.1 cm hypovascular lesion in the spleen (image 47 of series 2).  Adrenals/Urinary Tract: Tiny calcification in the medial limb of the right adrenal gland is unchanged, presumably related to prior infection or hemorrhage. Left adrenal gland is normal in appearance. Multiple sub cm low-attenuation lesions in the kidneys bilaterally are too small to definitively characterize, but are similar to prior studies, favored to represent tiny cysts. In addition, it is in the lower pole of the left kidney there is a 1.6 cm well-defined low-attenuation  lesion with a thin internal septation, compatible with a Bosniak class 2 cyst. No hydroureteronephrosis. Urinary bladder is normal in appearance.  Stomach/Bowel: The appearance of the stomach is normal. No pathologic dilatation of small bowel or colon.  Vascular/Lymphatic: Atherosclerosis throughout the abdominal and pelvic vasculature, without evidence of aneurysm or dissection. Numerous enlarged lymph nodes throughout the abdomen and pelvis measuring up to 11 mm in the left para-aortic nodal station, 16 mm in short axis in the left external iliac nodal station, and 14 mm in short axis in the left inguinal region. These all appear larger than prior study 03/30/2015.  Reproductive: Heterogeneously  enlarged and heterogeneously calcified uterus, compatible with a fibroid uterus. Ovaries are atrophic.  Other: Tiny paraumbilical hernia containing only a small amount of omental fat. No significant volume of ascites. No pneumoperitoneum.  Musculoskeletal: Numerous predominantly lytic lesions noted throughout the pelvis and spine, some of which show some interval sclerosis (best typified by a 2.7 x 1.8 cm lesion in the right iliac crest which is more sclerotic than the prior study), suggesting some interval healing. Large lytic lesion in the posterior aspect of the L4 vertebral body measuring 3.3 x 2.3 cm with some posterior extension into the anterior epidural space, similar to the prior examination (image 66 of series 2). Bilateral pars defects at L5 with 10 mm of anterolisthesis of L5 upon S1.  IMPRESSION: 1. Today's study demonstrates progression of disease, with worsening lymphadenopathy throughout the chest, abdomen and pelvis, as detailed above. In addition, there is an increased number and size of numerous hepatic lesions, a new splenic lesion, and persistence of a mass-like opacity in the left lower lobe which is concerning for probable lymphomatous involvement in the lung. 2. Numerous previously noted osseous  lesions appear generally similar to the prior study, with some evidence of interval bony healing in certain lesions, as discussed above. 3. Previously noted small left pleural effusion has resolved. However, there is a new small right-sided pleural effusion layering dependently. 4. Cholelithiasis without evidence of acute cholecystitis at this time. 5. Additional incidental findings, as above. These results will be called to the ordering clinician or representative by the Radiologist Assistant, and communication documented in the PACS or zVision Dashboard.   Electronically Signed   By: Vinnie Langton M.D.   On: 07/02/2015 11:25     ASSESSMENT & PLAN:  Cancer of left breast The patient has widespread metastatic cancer. She had very poor understanding of her disease. We have exhausted a lot of hormonal manipulation. For the last few months, we will using single agent Gemzar due to poor tolerance to treatment. It is not working. I recommend consideration for palliative care but the patient is not willing to stop treatment.  Lymphoma, small lymphocytic The radiation is very helpful to reduce right neck lymph node swelling and right upper extremity swelling. Unfortunately, CT scan showed widespread disease. I suspect the splenic lesion is from lymphoma. She has progressed on 4 different lines of chemotherapy. She wants treatment with something "simple" that would not make her sick. Again, I told the patient she has very unrealistic expectation. I felt that she may have high-grade lymphoma given rapid disease progression in the last few months. I recommend consideration for mini RCHOP chemotherapy but she does not want to hear about potential side effects including hair loss, nausea, vomiting, increased risk of infection, neuropathy and others. I spent a lot of time talking over the telephone with her son. The patient also has very poor compliance and has stopped taking Xarelto on her own. I  recommend consideration for home healthcare nurse or palliative care nurse for home visit but the patient declined. When I asked the patient about expectation, she wants to live for another 10 years feeling well without any side effects of treatment. I did not schedule any return appointment because I cannot meet her expectation.  Acute thrombosis of right internal jugular vein From swelling has improved since radiation treatment. She has not taking any Xarelto recently. When I questioned about that, she does not understand the reason for her to be on Xarelto. I reinforced the importance of  compliance and refill her medication to her pharmacy.  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 She received 1 unit of blood. The patient is educated of the reasons why she would need blood transfusion periodically for support   No orders of the defined types were placed in this encounter.   All questions were answered. The patient knows to call the clinic with any problems, questions or concerns. No barriers to learning was detected. I spent 25 minutes counseling the patient face to face. The total time spent in the appointment was 40 minutes and more than 50% was on counseling and review of test results     [facility], [place], [place] 07/03/2015 1:35 PM

## 2015-07-03 NOTE — Assessment & Plan Note (Signed)
The patient has widespread metastatic cancer. She had very poor understanding of her disease. We have exhausted a lot of hormonal manipulation. For the last few months, we will using single agent Gemzar due to poor tolerance to treatment. It is not working. I recommend consideration for palliative care but the patient is not willing to stop treatment.

## 2015-07-03 NOTE — Assessment & Plan Note (Signed)
This is related to bone marrow infiltration from cancer. She received 1 unit of blood. The patient is educated of the reasons why she would need blood transfusion periodically for support

## 2015-07-10 ENCOUNTER — Telehealth: Payer: Self-pay | Admitting: *Deleted

## 2015-07-10 ENCOUNTER — Telehealth: Payer: Self-pay | Admitting: Hematology and Oncology

## 2015-07-10 ENCOUNTER — Other Ambulatory Visit: Payer: Self-pay | Admitting: Hematology and Oncology

## 2015-07-10 DIAGNOSIS — C50912 Malignant neoplasm of unspecified site of left female breast: Secondary | ICD-10-CM

## 2015-07-10 DIAGNOSIS — C8300 Small cell B-cell lymphoma, unspecified site: Secondary | ICD-10-CM

## 2015-07-10 DIAGNOSIS — C7951 Secondary malignant neoplasm of bone: Secondary | ICD-10-CM

## 2015-07-10 DIAGNOSIS — C7802 Secondary malignant neoplasm of left lung: Secondary | ICD-10-CM

## 2015-07-10 DIAGNOSIS — C7801 Secondary malignant neoplasm of right lung: Secondary | ICD-10-CM

## 2015-07-10 NOTE — Telephone Encounter (Signed)
Patient's son, Evette Doffing called "returning call to Dr. Alvy Bimler about my mother."  Call transferred to Dr. Alvy Bimler.

## 2015-07-10 NOTE — Telephone Encounter (Signed)
Per staff message and POF I have scheduled appts. Advised scheduler of appts. JMW  

## 2015-07-10 NOTE — Telephone Encounter (Signed)
I spoke with the son over the telephone. The patient ultimately consented to proceed with R-CHOP chemotherapy. I will put orders for blood work and echocardiogram to be done on 9/8 and start cycle 1 of treatment on 9/12.

## 2015-07-12 ENCOUNTER — Telehealth: Payer: Self-pay | Admitting: Hematology and Oncology

## 2015-07-12 ENCOUNTER — Telehealth: Payer: Self-pay | Admitting: *Deleted

## 2015-07-12 NOTE — Telephone Encounter (Signed)
Pt left VM she cannot make appts as scheduled tomorrow d/t her son is not off work Architectural technologist and she does not have time to arrange transportation.   I called son's number and LVM informing of appts for tomorrow.   Asked him to return nurse's call.  We can r/s pt's echo and lab to Monday but pt will not be able to start chemo on Monday.  Echo needs to be done at least one or two days prior to chemo to get results.

## 2015-07-12 NOTE — Telephone Encounter (Signed)
Called pt w/ new schedule for Monday 9/12.  Instructed to go to [facility] at 9:45 am on MOnday 9/12 for Echo.  Then come over to [facility] for lab and MD appt.   Chemo will not start on Monday and Dr. Alvy Bimler will r/s to start another day.  Pt verbalized understanding.  I also called her son, Evette Doffing, and left VM informing of new schedule.

## 2015-07-12 NOTE — Telephone Encounter (Signed)
Left message to confirm appointment for 09/09 & 09/12

## 2015-07-12 NOTE — Telephone Encounter (Signed)
Pt called again.  She insists there is no way she can come tomorrow for any appoints.  Explained we can r/s her Echo and Lab to Monday but then we will have to r/s her chemo to start at a later date.  Pt says she understands and asks for Echo and lab to be on Monday.   Informed pt I will call her back w/ a time. I left VM for Echo asking to r/s to Monday 9/12.

## 2015-07-13 ENCOUNTER — Ambulatory Visit (HOSPITAL_COMMUNITY): Payer: Medicare Other

## 2015-07-13 ENCOUNTER — Other Ambulatory Visit: Payer: Self-pay

## 2015-07-16 ENCOUNTER — Ambulatory Visit: Payer: Self-pay

## 2015-07-16 ENCOUNTER — Encounter (INDEPENDENT_AMBULATORY_CARE_PROVIDER_SITE_OTHER): Payer: Self-pay

## 2015-07-16 ENCOUNTER — Encounter: Payer: Self-pay | Admitting: Hematology and Oncology

## 2015-07-16 ENCOUNTER — Telehealth: Payer: Self-pay | Admitting: Hematology and Oncology

## 2015-07-16 ENCOUNTER — Ambulatory Visit (HOSPITAL_COMMUNITY)
Admission: RE | Admit: 2015-07-16 | Discharge: 2015-07-16 | Disposition: A | Discharge status: Home / Self Care | Payer: Medicare Other | Source: Ambulatory Visit | Attending: Hematology and Oncology | Admitting: Hematology and Oncology

## 2015-07-16 ENCOUNTER — Ambulatory Visit (HOSPITAL_BASED_OUTPATIENT_CLINIC_OR_DEPARTMENT_OTHER): Payer: Medicare Other | Admitting: Hematology and Oncology

## 2015-07-16 ENCOUNTER — Other Ambulatory Visit (HOSPITAL_BASED_OUTPATIENT_CLINIC_OR_DEPARTMENT_OTHER): Payer: Medicare Other

## 2015-07-16 VITALS — BP 153/72 | HR 92 | Temp 97.6°F | Resp 18 | Ht 62.0 in | Wt 115.3 lb

## 2015-07-16 DIAGNOSIS — C7802 Secondary malignant neoplasm of left lung: Secondary | ICD-10-CM

## 2015-07-16 DIAGNOSIS — C7951 Secondary malignant neoplasm of bone: Secondary | ICD-10-CM

## 2015-07-16 DIAGNOSIS — C50912 Malignant neoplasm of unspecified site of left female breast: Secondary | ICD-10-CM

## 2015-07-16 DIAGNOSIS — C8300 Small cell B-cell lymphoma, unspecified site: Secondary | ICD-10-CM

## 2015-07-16 DIAGNOSIS — D630 Anemia in neoplastic disease: Secondary | ICD-10-CM | POA: Diagnosis not present

## 2015-07-16 DIAGNOSIS — C7801 Secondary malignant neoplasm of right lung: Secondary | ICD-10-CM | POA: Diagnosis not present

## 2015-07-16 DIAGNOSIS — Z01818 Encounter for other preprocedural examination: Secondary | ICD-10-CM | POA: Diagnosis present

## 2015-07-16 DIAGNOSIS — I82C11 Acute embolism and thrombosis of right internal jugular vein: Secondary | ICD-10-CM

## 2015-07-16 DIAGNOSIS — E785 Hyperlipidemia, unspecified: Secondary | ICD-10-CM | POA: Insufficient documentation

## 2015-07-16 LAB — CBC WITH DIFFERENTIAL/PLATELET
BASO%: 0.8 % (ref 0.0–2.0)
Basophils Absolute: 0 10*3/uL (ref 0.0–0.1)
EOS%: 4 % (ref 0.0–7.0)
Eosinophils Absolute: 0.1 10*3/uL (ref 0.0–0.5)
HCT: 28.1 % — ABNORMAL LOW (ref 34.8–46.6)
HGB: 8.6 g/dL — ABNORMAL LOW (ref 11.6–15.9)
LYMPH%: 3.2 % — AB (ref 14.0–49.7)
MCH: 24 pg — ABNORMAL LOW (ref 25.1–34.0)
MCHC: 30.6 g/dL — AB (ref 31.5–36.0)
MCV: 78.3 fL — ABNORMAL LOW (ref 79.5–101.0)
MONO#: 0.8 10*3/uL (ref 0.1–0.9)
MONO%: 31.9 % — AB (ref 0.0–14.0)
NEUT%: 60.1 % (ref 38.4–76.8)
NEUTROS ABS: 1.5 10*3/uL (ref 1.5–6.5)
PLATELETS: 380 10*3/uL (ref 145–400)
RBC: 3.59 10*6/uL — AB (ref 3.70–5.45)
RDW: 17.6 % — ABNORMAL HIGH (ref 11.2–14.5)
WBC: 2.5 10*3/uL — AB (ref 3.9–10.3)
lymph#: 0.1 10*3/uL — ABNORMAL LOW (ref 0.9–3.3)

## 2015-07-16 LAB — HOLD TUBE, BLOOD BANK

## 2015-07-16 LAB — LACTATE DEHYDROGENASE (CC13)

## 2015-07-16 LAB — COMPREHENSIVE METABOLIC PANEL (CC13)
ALT: 31 U/L (ref 0–55)
ANION GAP: 10 meq/L (ref 3–11)
AST: 31 U/L (ref 5–34)
Albumin: 2.7 g/dL — ABNORMAL LOW (ref 3.5–5.0)
Alkaline Phosphatase: 92 U/L (ref 40–150)
BILIRUBIN TOTAL: 0.41 mg/dL (ref 0.20–1.20)
BUN: 7.5 mg/dL (ref 7.0–26.0)
CHLORIDE: 103 meq/L (ref 98–109)
CO2: 24 meq/L (ref 22–29)
Calcium: 8.5 mg/dL (ref 8.4–10.4)
Creatinine: 0.7 mg/dL (ref 0.6–1.1)
GLUCOSE: 120 mg/dL (ref 70–140)
POTASSIUM: 3.4 meq/L — AB (ref 3.5–5.1)
SODIUM: 137 meq/L (ref 136–145)
Total Protein: 6.4 g/dL (ref 6.4–8.3)

## 2015-07-16 LAB — URIC ACID (CC13): URIC ACID, SERUM: 5 mg/dL (ref 2.6–7.4)

## 2015-07-16 MED ORDER — PREDNISONE 20 MG PO TABS: 40.0000 mg/m2 | ORAL_TABLET | Freq: Every day | ORAL | Status: DC

## 2015-07-16 MED ORDER — ONDANSETRON HCL 8 MG PO TABS: 8.0000 mg | ORAL_TABLET | Freq: Three times a day (TID) | ORAL | Status: DC | PRN

## 2015-07-16 NOTE — Assessment & Plan Note (Signed)
The radiation is very helpful to reduce right neck lymph node swelling and right upper extremity swelling. Unfortunately, CT scan showed widespread disease. I suspect the splenic lesion is from lymphoma. She has progressed on 4 different lines of chemotherapy. She wants treatment with something "simple" that would not make her sick. I felt that she may have high-grade lymphoma given rapid disease progression in the last few months. I recommend consideration for mini RCHOP chemotherapy We discussed the role of chemotherapy. The intent is for palliative The decision was made based on publication at the [facility]. It is a category 1 recommendation from NCCN.  CHOP Chemotherapy plus Rituximab Compared with CHOP Alone in Elderly Patients with Diffuse Large-B-Cell Lymphoma Jannet Askew, M.D., Rexene Edison, M.D., Ph.D., Farley Ly, M.D., Crawford Givens, M.D., Knox Royalty, M.D., Ricky Ala, M.D., Doree Fudge, M.D., Eppie Gibson Richarda Blade, M.D., Simona Huh, M.D., Ph.D., Jolene Schimke, M.D., Bishop Limbo, M.D., Evelene Croon, Evelene Croon, Ph.D., and Sallyanne Kuster, M.D. Alison Stalling J Med 2002; 346:235-242January 24, 2002DOI: 10.1056/[id number]  The rate of complete response was significantly higher in the group that received CHOP plus rituximab than in the group that received CHOP alone (76 percent vs. 63 percent, P=0.005). With a median follow-up of two years, event-free and overall survival times were significantly higher in the CHOP-plus-rituximab group (P<0.001 and P=0.007, respectively). The addition of rituximab to standard CHOP chemotherapy significantly reduced the risk of treatment failure and death (risk ratios, 0.58 [95 percent confidence interval, 0.44 to 0.77] and 0.64 [0.45 to 0.89], respectively). Clinically relevant toxicity was not significantly greater with CHOP plus rituximab.  We discussed some of the risks, benefits and side-effects of Rituximab,Cytoxan,  Adriamycin,Vincristine and Solumedrol/Prednisone.   Some of the short term side-effects included, though not limited to, risk of fatigue, weight loss, tumor lysis syndrome, risk of allergic reactions, pancytopenia, life-threatening infections, need for transfusions of blood products, nausea, vomiting, change in bowel habits, hair loss, risk of congestive heart failure, admission to hospital for various reasons, and risks of death.   Long term side-effects are also discussed including permanent damage to nerve function, chronic fatigue, and rare secondary malignancy including bone marrow disorders.   The patient is aware that the response rates discussed earlier is not guaranteed.    After a long discussion, patient made an informed decision to proceed with the prescribed plan of care.   Due to her age and co-morbidities, I would reduce her dose of chemotherapy by 50%.  I discussed with her on body injector for Neulasta and she declined.  I will schedule her Neulasta injection for 48 hours with each cycle of treatment.

## 2015-07-16 NOTE — Assessment & Plan Note (Signed)
This is likely anemia of chronic disease. The patient denies recent history of bleeding such as epistaxis, hematuria or hematochezia. She is asymptomatic from the anemia. We will observe for now.  She does not require transfusion now.   she will receive 1 unit of blood whenever hemoglobin dropped to less than 8 g.

## 2015-07-16 NOTE — Telephone Encounter (Signed)
Gave and printd appt sched and avs for pt for Sept and OCT °

## 2015-07-16 NOTE — Assessment & Plan Note (Signed)
The patient has widespread metastatic cancer. She had very poor understanding of her disease. We have exhausted a lot of hormonal manipulation. For the last few months, we will using single agent Gemzar due to poor tolerance to treatment. It is not working. I recommend consideration for palliative care but the patient is not willing to stop treatment. Ultimately, as we agreed to treat her with R-CHOP chemotherapy, hopefully that would treat her breast cancer as well. She agreed with the plan to proceed.

## 2015-07-16 NOTE — Progress Notes (Signed)
[place] OFFICE PROGRESS NOTE  Patient Care Team: [name], MD as PCP - General (Hematology and Oncology) [name], MD as Consulting Physician (Hematology and Oncology)  SUMMARY OF ONCOLOGIC HISTORY:   Cancer of left breast   08/03/2008 Initial Diagnosis Breast CA   11/06/2014 Imaging Repeat CT scan of the chest, abdomen and pelvis show regression in the size of liver metastasis and pulmonary metastasis with mild progression of lymphadenopathy   11/06/2014 Tumor Marker [id number] is elevated at 275   01/18/2015 Imaging MRI head is negative for metastatic disease   01/18/2015 Imaging CT scan of the chest, abdomen and pelvis show significant disease progression throughout.   01/25/2015 - 05/15/2015 Chemotherapy She started Gemzar every other week and Ibrutinib. Treatment is discontinued due to progression   02/20/2015 Tumor Marker CA 27-29 at 246   03/30/2015 Imaging Repeat CT scan of the chest, abdomen and pelvis show positive response to treatment for lymphoma. Liver lesions are stable.   06/05/2015 - 06/18/2015 Radiation Therapy  she completed radiation treatment to the right supraclavicular region    INTERVAL HISTORY: Please see below for problem oriented charting.  She returns today with her son to discuss treatment options for her lymphoma and breast cancer. She had poor appetite and has lost some weight. She is not taking her blood thinner/Xarelto consistently. The patient denies any recent signs or symptoms of bleeding such as spontaneous epistaxis, hematuria or hematochezia.   REVIEW OF SYSTEMS:   Constitutional: Denies fevers, chills or abnormal weight loss Eyes: Denies blurriness of vision Ears, nose, mouth, throat, and face: Denies mucositis or sore throat Respiratory: Denies cough, dyspnea or wheezes Cardiovascular: Denies palpitation, chest discomfort or lower extremity swelling Gastrointestinal:  Denies nausea, heartburn or change in bowel habits Skin: Denies  abnormal skin rashes Lymphatics: Denies new lymphadenopathy or easy bruising Neurological:Denies numbness, tingling or new weaknesses Behavioral/Psych: Mood is stable, no new changes  All other systems were reviewed with the patient and are negative.  I have reviewed the past medical history, past surgical history, social history and family history with the patient and they are unchanged from previous note.  ALLERGIES:  is allergic to codeine; penicillins; sulfonamide derivatives; tape; and tramadol hcl.  MEDICATIONS:  Current Outpatient Prescriptions  Medication Sig Dispense Refill  . HYDROcodone-acetaminophen (NORCO) 5-325 MG per tablet Take 1 tablet by mouth every 6 (six) hours as needed for moderate pain. 60 tablet 0  . lidocaine-prilocaine (EMLA) cream Apply 1 application topically as needed. 30 g 6  . ondansetron (ZOFRAN) 8 MG tablet Take 1 tablet (8 mg total) by mouth every 8 (eight) hours as needed. . 30 tablet 1  . predniSONE (DELTASONE) 20 MG tablet Take 3 tablets (60 mg total) by mouth daily. Take on days 2-5 of chemotherapy. 12 tablet 6  . rivaroxaban (XARELTO) 20 MG TABS tablet Take 1 tablet (20 mg total) by mouth daily with supper. 30 tablet 6   No current facility-administered medications for this visit.    PHYSICAL EXAMINATION: ECOG PERFORMANCE STATUS: 2 - Symptomatic, <50% confined to bed  Filed Vitals:   07/16/15 1149  BP: 153/72  Pulse: 92  Temp: 97.6 F (36.4 C)  Resp: 18   Filed Weights   07/16/15 1149  Weight: 115 lb 4.8 oz (52.3 kg)    GENERAL:alert, no distress and comfortable SKIN: skin color, texture, turgor are normal, no rashes or significant lesions EYES: normal, Conjunctiva are pale and non-injected, sclera clear OROPHARYNX:no exudate, no erythema and  lips, buccal mucosa, and tongue normal  Musculoskeletal:no cyanosis of digits and no clubbing  NEURO: alert & oriented x 3 with fluent speech, no focal motor/sensory deficits  LABORATORY DATA:   I have reviewed the data as listed    Component Value Date/Time   NA 137 07/16/2015 1112   NA 139 01/13/2015 1730   NA 142 06/01/2012 1008   K 3.4* 07/16/2015 1112   K 4.0 01/13/2015 1730   K 3.8 06/01/2012 1008   CL 103 01/13/2015 1730   CL 107 04/22/2013 0922   CL 103 06/01/2012 1008   CO2 24 07/16/2015 1112   CO2 25 06/13/2014 0819   CO2 25 06/01/2012 1008   GLUCOSE 120 07/16/2015 1112   GLUCOSE 93 01/13/2015 1730   GLUCOSE 89 04/22/2013 0922   GLUCOSE 108 06/01/2012 1008   BUN 7.5 07/16/2015 1112   BUN 12 01/13/2015 1730   BUN 9 06/01/2012 1008   CREATININE 0.7 07/16/2015 1112   CREATININE 0.80 01/13/2015 1730   CREATININE 0.9 06/01/2012 1008   CALCIUM 8.5 07/16/2015 1112   CALCIUM 10.8* 06/13/2014 0819   CALCIUM 9.8 06/01/2012 1008   PROT 6.4 07/16/2015 1112   PROT 7.5 06/13/2014 0819   PROT 8.1 08/14/2010 1251   ALBUMIN 2.7* 07/16/2015 1112   ALBUMIN 3.9 06/13/2014 0819   AST 31 07/16/2015 1112   AST 23 06/13/2014 0819   AST 19 08/14/2010 1251   ALT 31 07/16/2015 1112   ALT 13 06/13/2014 0819   ALT 9* 08/14/2010 1251   ALKPHOS 92 07/16/2015 1112   ALKPHOS 89 06/13/2014 0819   ALKPHOS 78 08/14/2010 1251   BILITOT 0.41 07/16/2015 1112   BILITOT 0.2* 06/13/2014 0819   BILITOT 0.40 08/14/2010 1251   GFRNONAA 87* 07/06/2012 1122   GFRAA >90 07/06/2012 1122    No results found for: SPEP, UPEP  Lab Results  Component Value Date   WBC 2.5* 07/16/2015   NEUTROABS 1.5 07/16/2015   HGB 8.6* 07/16/2015   HCT 28.1* 07/16/2015   MCV 78.3* 07/16/2015   PLT 380 07/16/2015      Chemistry      Component Value Date/Time   NA 137 07/16/2015 1112   NA 139 01/13/2015 1730   NA 142 06/01/2012 1008   K 3.4* 07/16/2015 1112   K 4.0 01/13/2015 1730   K 3.8 06/01/2012 1008   CL 103 01/13/2015 1730   CL 107 04/22/2013 0922   CL 103 06/01/2012 1008   CO2 24 07/16/2015 1112   CO2 25 06/13/2014 0819   CO2 25 06/01/2012 1008   BUN 7.5 07/16/2015 1112   BUN 12  01/13/2015 1730   BUN 9 06/01/2012 1008   CREATININE 0.7 07/16/2015 1112   CREATININE 0.80 01/13/2015 1730   CREATININE 0.9 06/01/2012 1008      Component Value Date/Time   CALCIUM 8.5 07/16/2015 1112   CALCIUM 10.8* 06/13/2014 0819   CALCIUM 9.8 06/01/2012 1008   ALKPHOS 92 07/16/2015 1112   ALKPHOS 89 06/13/2014 0819   ALKPHOS 78 08/14/2010 1251   AST 31 07/16/2015 1112   AST 23 06/13/2014 0819   AST 19 08/14/2010 1251   ALT 31 07/16/2015 1112   ALT 13 06/13/2014 0819   ALT 9* 08/14/2010 1251   BILITOT 0.41 07/16/2015 1112   BILITOT 0.2* 06/13/2014 0819   BILITOT 0.40 08/14/2010 1251      ASSESSMENT & PLAN:  Cancer of left breast The patient has widespread metastatic cancer. She had very poor  understanding of her disease. We have exhausted a lot of hormonal manipulation. For the last few months, we will using single agent Gemzar due to poor tolerance to treatment. It is not working. I recommend consideration for palliative care but the patient is not willing to stop treatment. Ultimately, as we agreed to treat her with R-CHOP chemotherapy, hopefully that would treat her breast cancer as well. She agreed with the plan to proceed.  Lymphoma, small lymphocytic The radiation is very helpful to reduce right neck lymph node swelling and right upper extremity swelling. Unfortunately, CT scan showed widespread disease. I suspect the splenic lesion is from lymphoma. She has progressed on 4 different lines of chemotherapy. She wants treatment with something "simple" that would not make her sick. I felt that she may have high-grade lymphoma given rapid disease progression in the last few months. I recommend consideration for mini RCHOP chemotherapy We discussed the role of chemotherapy. The intent is for palliative The decision was made based on publication at the [facility]. It is a category 1 recommendation from NCCN.  CHOP Chemotherapy plus Rituximab Compared with CHOP Alone in  Elderly Patients with Diffuse Large-B-Cell Lymphoma Jannet Askew, M.D., Rexene Edison, M.D., Ph.D., Farley Ly, M.D., Crawford Givens, M.D., Knox Royalty, M.D., Ricky Ala, M.D., Doree Fudge, M.D., Eppie Gibson Richarda Blade, M.D., Simona Huh, M.D., Ph.D., Jolene Schimke, M.D., Bishop Limbo, M.D., Evelene Croon, Evelene Croon, Ph.D., and Sallyanne Kuster, M.D. Alison Stalling J Med 2002; 346:235-242January 24, 2002DOI: 10.1056/[id number]  The rate of complete response was significantly higher in the group that received CHOP plus rituximab than in the group that received CHOP alone (76 percent vs. 63 percent, P=0.005). With a median follow-up of two years, event-free and overall survival times were significantly higher in the CHOP-plus-rituximab group (P<0.001 and P=0.007, respectively). The addition of rituximab to standard CHOP chemotherapy significantly reduced the risk of treatment failure and death (risk ratios, 0.58 [95 percent confidence interval, 0.44 to 0.77] and 0.64 [0.45 to 0.89], respectively). Clinically relevant toxicity was not significantly greater with CHOP plus rituximab.  We discussed some of the risks, benefits and side-effects of Rituximab,Cytoxan, Adriamycin,Vincristine and Solumedrol/Prednisone.   Some of the short term side-effects included, though not limited to, risk of fatigue, weight loss, tumor lysis syndrome, risk of allergic reactions, pancytopenia, life-threatening infections, need for transfusions of blood products, nausea, vomiting, change in bowel habits, hair loss, risk of congestive heart failure, admission to hospital for various reasons, and risks of death.   Long term side-effects are also discussed including permanent damage to nerve function, chronic fatigue, and rare secondary malignancy including bone marrow disorders.   The patient is aware that the response rates discussed earlier is not guaranteed.    After a long discussion, patient made an  informed decision to proceed with the prescribed plan of care.   Due to her age and co-morbidities, I would reduce her dose of chemotherapy by 50%.  I discussed with her on body injector for Neulasta and she declined.  I will schedule her Neulasta injection for 48 hours with each cycle of treatment.  Anemia in neoplastic disease This is likely anemia of chronic disease. The patient denies recent history of bleeding such as epistaxis, hematuria or hematochezia. She is asymptomatic from the anemia. We will observe for now.  She does not require transfusion now.   she will receive 1 unit of blood whenever hemoglobin dropped to less than 8 g.  Acute thrombosis of right internal jugular vein From swelling  has improved since radiation treatment. She has not taking any Xarelto consistently. When I questioned about that, she does not understand the reason for her to be on Xarelto. I reinforced the importance of compliance and refill her medication to her pharmacy.   No orders of the defined types were placed in this encounter.   All questions were answered. The patient knows to call the clinic with any problems, questions or concerns. No barriers to learning was detected. I spent 30 minutes counseling the patient face to face. The total time spent in the appointment was 40 minutes and more than 50% was on counseling and review of test results     [facility], Ama, MD 07/16/2015 1:30 PM

## 2015-07-16 NOTE — Assessment & Plan Note (Signed)
From swelling has improved since radiation treatment. She has not taking any Xarelto consistently. When I questioned about that, she does not understand the reason for her to be on Xarelto. I reinforced the importance of compliance and refill her medication to her pharmacy.

## 2015-07-17 LAB — HEPATITIS B SURFACE ANTIBODY,QUALITATIVE: HEP B S AB: NEGATIVE

## 2015-07-17 LAB — HEPATITIS B CORE ANTIBODY, IGM: HEP B C IGM: NONREACTIVE

## 2015-07-17 LAB — HEPATITIS B SURFACE ANTIGEN: HEP B S AG: NEGATIVE

## 2015-07-18 ENCOUNTER — Ambulatory Visit: Payer: Self-pay

## 2015-07-19 ENCOUNTER — Ambulatory Visit: Payer: Medicare Other | Admitting: Radiation Oncology

## 2015-07-23 ENCOUNTER — Ambulatory Visit (HOSPITAL_BASED_OUTPATIENT_CLINIC_OR_DEPARTMENT_OTHER): Payer: Medicare Other

## 2015-07-23 VITALS — BP 129/66 | HR 65 | Temp 97.8°F | Resp 20

## 2015-07-23 DIAGNOSIS — Z5111 Encounter for antineoplastic chemotherapy: Secondary | ICD-10-CM | POA: Diagnosis present

## 2015-07-23 DIAGNOSIS — C50912 Malignant neoplasm of unspecified site of left female breast: Secondary | ICD-10-CM

## 2015-07-23 DIAGNOSIS — C7951 Secondary malignant neoplasm of bone: Secondary | ICD-10-CM | POA: Diagnosis not present

## 2015-07-23 DIAGNOSIS — C8300 Small cell B-cell lymphoma, unspecified site: Secondary | ICD-10-CM

## 2015-07-23 MED ORDER — DIPHENHYDRAMINE HCL 25 MG PO CAPS
ORAL_CAPSULE | ORAL | Status: AC
  Filled 2015-07-23: qty 2

## 2015-07-23 MED ORDER — ALBUTEROL SULFATE (2.5 MG/3ML) 0.083% IN NEBU
2.5000 mg | INHALATION_SOLUTION | Freq: Once | RESPIRATORY_TRACT | Status: DC | PRN
  Filled 2015-07-23: qty 3

## 2015-07-23 MED ORDER — DIPHENHYDRAMINE HCL 50 MG/ML IJ SOLN: 25.0000 mg | Freq: Once | INTRAMUSCULAR | Status: DC | PRN

## 2015-07-23 MED ORDER — DOXORUBICIN HCL CHEMO IV INJECTION 2 MG/ML
25.0000 mg/m2 | Freq: Once | INTRAVENOUS | Status: AC
  Administered 2015-07-23: 38 mg via INTRAVENOUS
  Filled 2015-07-23: qty 19

## 2015-07-23 MED ORDER — DIPHENHYDRAMINE HCL 50 MG/ML IJ SOLN: 50.0000 mg | Freq: Once | INTRAMUSCULAR | Status: DC | PRN

## 2015-07-23 MED ORDER — SODIUM CHLORIDE 0.9 % IV SOLN: Freq: Once | INTRAVENOUS | Status: DC | PRN

## 2015-07-23 MED ORDER — SODIUM CHLORIDE 0.9 % IV SOLN
375.0000 mg/m2 | Freq: Once | INTRAVENOUS | Status: AC
  Administered 2015-07-23: 580 mg via INTRAVENOUS
  Filled 2015-07-23: qty 29

## 2015-07-23 MED ORDER — SODIUM CHLORIDE 0.9 % IV SOLN
Freq: Once | INTRAVENOUS | Status: AC
  Administered 2015-07-23: 10:00:00 via INTRAVENOUS
  Filled 2015-07-23: qty 8

## 2015-07-23 MED ORDER — ACETAMINOPHEN 325 MG PO TABS
650.0000 mg | ORAL_TABLET | Freq: Once | ORAL | Status: AC
  Administered 2015-07-23: 650 mg via ORAL

## 2015-07-23 MED ORDER — METHYLPREDNISOLONE SODIUM SUCC 125 MG IJ SOLR: 125.0000 mg | Freq: Once | INTRAMUSCULAR | Status: DC | PRN

## 2015-07-23 MED ORDER — DENOSUMAB 120 MG/1.7ML ~~LOC~~ SOLN
120.0000 mg | Freq: Once | SUBCUTANEOUS | Status: AC
  Administered 2015-07-23: 120 mg via SUBCUTANEOUS
  Filled 2015-07-23: qty 1.7

## 2015-07-23 MED ORDER — SODIUM CHLORIDE 0.9 % IV SOLN
1.0000 mg | Freq: Once | INTRAVENOUS | Status: AC
Start: 2015-07-23 — End: 2015-07-23
  Administered 2015-07-23: 1 mg via INTRAVENOUS
  Filled 2015-07-23: qty 1

## 2015-07-23 MED ORDER — EPINEPHRINE HCL 0.1 MG/ML IJ SOLN: 0.2500 mg | Freq: Once | INTRAMUSCULAR | Status: DC | PRN

## 2015-07-23 MED ORDER — SODIUM CHLORIDE 0.9 % IV SOLN
375.0000 mg/m2 | Freq: Once | INTRAVENOUS | Status: AC
  Administered 2015-07-23: 600 mg via INTRAVENOUS
  Filled 2015-07-23: qty 60

## 2015-07-23 MED ORDER — SODIUM CHLORIDE 0.9 % IJ SOLN
10.0000 mL | INTRAMUSCULAR | Status: DC | PRN
  Administered 2015-07-23: 10 mL
  Filled 2015-07-23: qty 10

## 2015-07-23 MED ORDER — HEPARIN SOD (PORK) LOCK FLUSH 100 UNIT/ML IV SOLN
500.0000 [IU] | Freq: Once | INTRAVENOUS | Status: AC | PRN
  Administered 2015-07-23: 500 [IU]
  Filled 2015-07-23: qty 5

## 2015-07-23 MED ORDER — ACETAMINOPHEN 325 MG PO TABS
ORAL_TABLET | ORAL | Status: AC
  Filled 2015-07-23: qty 2

## 2015-07-23 MED ORDER — DIPHENHYDRAMINE HCL 25 MG PO CAPS
50.0000 mg | ORAL_CAPSULE | Freq: Once | ORAL | Status: AC
  Administered 2015-07-23: 50 mg via ORAL

## 2015-07-23 MED ORDER — SODIUM CHLORIDE 0.9 % IV SOLN
Freq: Once | INTRAVENOUS | Status: AC
  Administered 2015-07-23: 10:00:00 via INTRAVENOUS

## 2015-07-23 NOTE — Patient Instructions (Signed)
[facility] Discharge Instructions for Patients Receiving Chemotherapy  Today you received the following chemotherapy agents:  Adriamycin, Vincristine, Cytoxan and Rituxan  To help prevent nausea and vomiting after your treatment, we encourage you to take your nausea medication as ordered per MD.   If you develop nausea and vomiting that is not controlled by your nausea medication, call the clinic.   BELOW ARE SYMPTOMS THAT SHOULD BE REPORTED IMMEDIATELY:  *FEVER GREATER THAN 100.5 F  *CHILLS WITH OR WITHOUT FEVER  NAUSEA AND VOMITING THAT IS NOT CONTROLLED WITH YOUR NAUSEA MEDICATION  *UNUSUAL SHORTNESS OF BREATH  *UNUSUAL BRUISING OR BLEEDING  TENDERNESS IN MOUTH AND THROAT WITH OR WITHOUT PRESENCE OF ULCERS  *URINARY PROBLEMS  *BOWEL PROBLEMS  UNUSUAL RASH Items with * indicate a potential emergency and should be followed up as soon as possible.  Feel free to call the clinic you have any questions or concerns. The clinic phone number is [phone].  Please show the CHEMO ALERT CARD at check-in to the Emergency Department and triage nurse.   

## 2015-07-23 NOTE — Progress Notes (Signed)
  Radiation Oncology         [phone]) [phone] ________________________________  Name: Helen [name] MRN: [id number]  Date: 06/18/2015  DOB: 1935-06-20  End of Treatment Note  Diagnosis:   Lymphoma. The patient also has a diagnosis of metastatic breast cancer     Indication for treatment::  palliative       Radiation treatment dates:   06/05/2015 through 06/18/2015  Site/dose:   The patient was treated to the right neck to a dose of 25 gray in 10 fractions.  Narrative: The patient tolerated radiation treatment relatively well.     Plan: The patient has completed radiation treatment. The patient will return to radiation oncology clinic for routine followup in one month. I advised the patient to call or return sooner if they have any questions or concerns related to their recovery or treatment. ________________________________  Jodelle Gross, M.D., Ph.D.

## 2015-07-23 NOTE — Progress Notes (Signed)
  Radiation Oncology         [phone] ________________________________  Name: Akisha Sturgill MRN: [id number]  Date: 05/28/2015  DOB: 01-Jan-1935  SIMULATION AND TREATMENT PLANNING NOTE  DIAGNOSIS:  Lymphoma with a history of metastatic breast cancer    ICD-9-CM ICD-10-CM   1. Metastasis to supraclavicular lymph node 196.0 C77.0 UE VENOUS DUPLEX     Site:  Right neck/supraclavicular region  NARRATIVE:  The patient was brought to the [facility].  Identity was confirmed.  All relevant records and images related to the planned course of therapy were reviewed.   Written consent to proceed with treatment was confirmed which was freely given after reviewing the details related to the planned course of therapy had been reviewed with the patient.  Then, the patient was set-up in a stable reproducible  supine position for radiation therapy.  CT images were obtained.  Surface markings were placed.    Medically necessary complex treatment device(s) for immobilization:  Customized thermoplastic head cast.   The CT images were loaded into the planning software.  Then the target and avoidance structures were contoured.  Treatment planning then occurred.  The radiation prescription was entered and confirmed.  A total of 2 complex treatment devices were fabricated which relate to the designed radiation treatment fields. Each of these customized fields/ complex treatment devices will be used on a daily basis during the radiation course. I have requested : Isodose Plan.   PLAN:  The patient will receive 25 Gy in 10 fractions.  ________________________________   Jodelle Gross, MD, PhD

## 2015-07-23 NOTE — Addendum Note (Signed)
Encounter addended by: Kyung Rudd, MD on: 07/23/2015  6:34 PM<BR>     Documentation filed: Notes Section, Problem List, Visit Diagnoses

## 2015-07-24 ENCOUNTER — Other Ambulatory Visit: Payer: Self-pay | Admitting: *Deleted

## 2015-07-24 ENCOUNTER — Telehealth: Payer: Self-pay | Admitting: *Deleted

## 2015-07-24 MED ORDER — LIDOCAINE-PRILOCAINE 2.5-2.5 % EX CREA: 1.0000 "application " | TOPICAL_CREAM | CUTANEOUS | Status: DC | PRN

## 2015-07-24 NOTE — Telephone Encounter (Signed)
-----   Message from San Morelle, RN sent at 07/23/2015  5:45 PM EDT ----- Regarding: chemo f/u call Contact: [phone] First time R-CHOP.  Pt tolerated with no difficulties or complaints.  Please review anti emetics with pt.  Pt also stated that she did not have any EMLA cream, that her insurance did not cover it.  Are there any other options for her?  Thanks friends!!  She is coming for Neulasta on Wednesday.

## 2015-07-24 NOTE — Telephone Encounter (Signed)
Called patient for chemo follow up. Pt states she is "in a bad mood, had 4 treatments yesterday and had to take a steroid". Denies any N/V, has zofran at home if needed. Tolerating fluids, food OK. States she is going to take a walk to see if she can feel better.

## 2015-07-25 ENCOUNTER — Ambulatory Visit (HOSPITAL_BASED_OUTPATIENT_CLINIC_OR_DEPARTMENT_OTHER): Payer: Medicare Other

## 2015-07-25 VITALS — BP 131/55 | HR 72 | Temp 97.6°F

## 2015-07-25 DIAGNOSIS — C8300 Small cell B-cell lymphoma, unspecified site: Secondary | ICD-10-CM | POA: Diagnosis not present

## 2015-07-25 DIAGNOSIS — C50912 Malignant neoplasm of unspecified site of left female breast: Secondary | ICD-10-CM

## 2015-07-25 DIAGNOSIS — C7951 Secondary malignant neoplasm of bone: Secondary | ICD-10-CM | POA: Diagnosis present

## 2015-07-25 MED ORDER — PEGFILGRASTIM INJECTION 6 MG/0.6ML ~~LOC~~
6.0000 mg | PREFILLED_SYRINGE | Freq: Once | SUBCUTANEOUS | Status: AC
  Administered 2015-07-25: 6 mg via SUBCUTANEOUS
  Filled 2015-07-25: qty 0.6

## 2015-07-26 ENCOUNTER — Ambulatory Visit (HOSPITAL_COMMUNITY)
Admission: RE | Admit: 2015-07-26 | Discharge: 2015-07-26 | Disposition: A | Discharge status: Home / Self Care | Payer: Medicare Other | Source: Ambulatory Visit | Attending: Hematology and Oncology | Admitting: Hematology and Oncology

## 2015-07-27 ENCOUNTER — Encounter: Payer: Self-pay | Admitting: Radiation Oncology

## 2015-07-30 ENCOUNTER — Telehealth: Payer: Self-pay | Admitting: Hematology and Oncology

## 2015-07-30 ENCOUNTER — Ambulatory Visit
Admission: RE | Admit: 2015-07-30 | Discharge: 2015-07-30 | Disposition: A | Discharge status: Home / Self Care | Payer: Medicare Other | Source: Ambulatory Visit | Attending: Radiation Oncology | Admitting: Radiation Oncology

## 2015-07-30 ENCOUNTER — Encounter: Payer: Self-pay | Admitting: Hematology and Oncology

## 2015-07-30 ENCOUNTER — Ambulatory Visit (HOSPITAL_BASED_OUTPATIENT_CLINIC_OR_DEPARTMENT_OTHER): Payer: Medicare Other | Admitting: Hematology and Oncology

## 2015-07-30 ENCOUNTER — Other Ambulatory Visit (HOSPITAL_BASED_OUTPATIENT_CLINIC_OR_DEPARTMENT_OTHER): Payer: Medicare Other

## 2015-07-30 ENCOUNTER — Encounter: Payer: Self-pay | Admitting: Radiation Oncology

## 2015-07-30 VITALS — BP 114/59 | HR 96 | Temp 97.7°F | Wt 115.7 lb

## 2015-07-30 VITALS — BP 118/52 | HR 110 | Temp 98.6°F | Resp 18 | Ht 62.0 in | Wt 115.0 lb

## 2015-07-30 DIAGNOSIS — C7951 Secondary malignant neoplasm of bone: Secondary | ICD-10-CM

## 2015-07-30 DIAGNOSIS — D6481 Anemia due to antineoplastic chemotherapy: Secondary | ICD-10-CM | POA: Diagnosis not present

## 2015-07-30 DIAGNOSIS — C50912 Malignant neoplasm of unspecified site of left female breast: Secondary | ICD-10-CM

## 2015-07-30 DIAGNOSIS — D701 Agranulocytosis secondary to cancer chemotherapy: Secondary | ICD-10-CM

## 2015-07-30 DIAGNOSIS — C8300 Small cell B-cell lymphoma, unspecified site: Secondary | ICD-10-CM

## 2015-07-30 DIAGNOSIS — T451X5A Adverse effect of antineoplastic and immunosuppressive drugs, initial encounter: Secondary | ICD-10-CM

## 2015-07-30 DIAGNOSIS — D72819 Decreased white blood cell count, unspecified: Secondary | ICD-10-CM

## 2015-07-30 DIAGNOSIS — K1231 Oral mucositis (ulcerative) due to antineoplastic therapy: Secondary | ICD-10-CM

## 2015-07-30 DIAGNOSIS — C770 Secondary and unspecified malignant neoplasm of lymph nodes of head, face and neck: Secondary | ICD-10-CM

## 2015-07-30 HISTORY — DX: Personal history of irradiation: Z92.3

## 2015-07-30 LAB — COMPREHENSIVE METABOLIC PANEL (CC13)
ALBUMIN: 2.8 g/dL — AB (ref 3.5–5.0)
ALK PHOS: 80 U/L (ref 40–150)
ALT: 29 U/L (ref 0–55)
AST: 17 U/L (ref 5–34)
Anion Gap: 10 mEq/L (ref 3–11)
BUN: 7.7 mg/dL (ref 7.0–26.0)
CALCIUM: 9.6 mg/dL (ref 8.4–10.4)
CO2: 29 mEq/L (ref 22–29)
CREATININE: 0.7 mg/dL (ref 0.6–1.1)
Chloride: 97 mEq/L — ABNORMAL LOW (ref 98–109)
EGFR: >90 mL/min/{1.73_m2} (ref >90)
GLUCOSE: 137 mg/dL (ref 70–140)
Potassium: 3.3 mEq/L — ABNORMAL LOW (ref 3.5–5.1)
Sodium: 136 mEq/L (ref 136–145)
Total Bilirubin: 0.81 mg/dL (ref 0.20–1.20)
Total Protein: 5.9 g/dL — ABNORMAL LOW (ref 6.4–8.3)

## 2015-07-30 LAB — CBC WITH DIFFERENTIAL/PLATELET
BASO%: 5.3 % — ABNORMAL HIGH (ref 0.0–2.0)
BASOS ABS: 0 10*3/uL (ref 0.0–0.1)
EOS ABS: 0.1 10*3/uL (ref 0.0–0.5)
EOS%: 12.3 % — ABNORMAL HIGH (ref 0.0–7.0)
HEMATOCRIT: 27.8 % — AB (ref 34.8–46.6)
HEMOGLOBIN: 8.7 g/dL — AB (ref 11.6–15.9)
LYMPH#: 0.1 10*3/uL — AB (ref 0.9–3.3)
LYMPH%: 17.5 % (ref 14.0–49.7)
MCH: 23.7 pg — ABNORMAL LOW (ref 25.1–34.0)
MCHC: 31.3 g/dL — ABNORMAL LOW (ref 31.5–36.0)
MCV: 75.7 fL — AB (ref 79.5–101.0)
MONO#: 0.3 10*3/uL (ref 0.1–0.9)
MONO%: 52.6 % — ABNORMAL HIGH (ref 0.0–14.0)
NEUT#: 0.1 10*3/uL — CL (ref 1.5–6.5)
NEUT%: 12.3 % — ABNORMAL LOW (ref 38.4–76.8)
NRBC: 0 % (ref 0–0)
PLATELETS: 287 10*3/uL (ref 145–400)
RBC: 3.67 10*6/uL — ABNORMAL LOW (ref 3.70–5.45)
RDW: 17.1 % — AB (ref 11.2–14.5)
WBC: 0.6 10*3/uL — CL (ref 3.9–10.3)

## 2015-07-30 LAB — HOLD TUBE, BLOOD BANK

## 2015-07-30 LAB — CANCER ANTIGEN 27.29: CA 27.29: 180 U/mL — ABNORMAL HIGH (ref 0–39)

## 2015-07-30 MED ORDER — LEVOFLOXACIN 500 MG PO TABS: 500.0000 mg | ORAL_TABLET | Freq: Every day | ORAL | Status: DC

## 2015-07-30 NOTE — Telephone Encounter (Signed)
Gave and printed appt sched and avs fo rpt for OCT °

## 2015-07-30 NOTE — Progress Notes (Signed)
[place] OFFICE PROGRESS NOTE  Patient Care Team: [name], MD as PCP - General (Hematology and Oncology) [name], MD as Consulting Physician (Hematology and Oncology)  SUMMARY OF ONCOLOGIC HISTORY:   Cancer of left breast   08/03/2008 Initial Diagnosis Breast CA   11/06/2014 Imaging Repeat CT scan of the chest, abdomen and pelvis show regression in the size of liver metastasis and pulmonary metastasis with mild progression of lymphadenopathy   11/06/2014 Tumor Marker [id number] is elevated at 275   01/18/2015 Imaging MRI head is negative for metastatic disease   01/18/2015 Imaging CT scan of the chest, abdomen and pelvis show significant disease progression throughout.   01/25/2015 - 05/15/2015 Chemotherapy She started Gemzar every other week and Ibrutinib. Treatment is discontinued due to progression   02/20/2015 Tumor Marker CA [id number] at 246   03/30/2015 Imaging Repeat CT scan of the chest, abdomen and pelvis show positive response to treatment for lymphoma. Liver lesions are stable.   06/05/2015 - 06/18/2015 Radiation Therapy  she completed radiation treatment to the right supraclavicular region   07/23/2015 -  Chemotherapy She recieved cycle 1 of [facility] for SLL    INTERVAL HISTORY: Please see below for problem oriented charting. She is seen today for supportive care. She has been having some coughing the last 2 days with associated mucositis. She has productive white phlegm. She noted one episode of hemoptysis of small amount after significant coughing. She denies other bleeding. The patient denies any recent signs or symptoms of bleeding such as spontaneous epistaxis, hematuria or hematochezia. She denies new lymphadenopathy. No nausea or vomiting.  REVIEW OF SYSTEMS:   Constitutional: Denies fevers, chills or abnormal weight loss Eyes: Denies blurriness of vision Cardiovascular: Denies palpitation, chest discomfort or lower extremity swelling Gastrointestinal:  Denies  nausea, heartburn or change in bowel habits Skin: Denies abnormal skin rashes Lymphatics: Denies new lymphadenopathy or easy bruising Neurological:Denies numbness, tingling or new weaknesses Behavioral/Psych: Mood is stable, no new changes  All other systems were reviewed with the patient and are negative.  I have reviewed the past medical history, past surgical history, social history and family history with the patient and they are unchanged from previous note.  ALLERGIES:  is allergic to codeine; penicillins; sulfonamide derivatives; tape; and tramadol hcl.  MEDICATIONS:  Current Outpatient Prescriptions  Medication Sig Dispense Refill  . HYDROcodone-acetaminophen (NORCO) 5-325 MG per tablet Take 1 tablet by mouth every 6 (six) hours as needed for moderate pain. 60 tablet 0  . levofloxacin (LEVAQUIN) 500 MG tablet Take 1 tablet (500 mg total) by mouth daily. 7 tablet 0  . lidocaine-prilocaine (EMLA) cream Apply 1 application topically as needed. 30 g 6  . ondansetron (ZOFRAN) 8 MG tablet Take 1 tablet (8 mg total) by mouth every 8 (eight) hours as needed. . 30 tablet 1  . predniSONE (DELTASONE) 20 MG tablet Take 3 tablets (60 mg total) by mouth daily. Take on days 2-5 of chemotherapy. 12 tablet 6  . rivaroxaban (XARELTO) 20 MG TABS tablet Take 1 tablet (20 mg total) by mouth daily with supper. 30 tablet 6   No current facility-administered medications for this visit.    PHYSICAL EXAMINATION: ECOG PERFORMANCE STATUS: 1 - Symptomatic but completely ambulatory  Filed Vitals:   07/30/15 0858  BP: 118/52  Pulse: 110  Temp: 98.6 F (37 C)  Resp: 18   Filed Weights   07/30/15 0858  Weight: 115 lb (52.164 kg)    GENERAL:alert, no distress and  comfortable SKIN: skin color, texture, turgor are normal, no rashes or significant lesions EYES: normal, Conjunctiva are pale and non-injected, sclera clear OROPHARYNX:no exudate, no erythema and lips, buccal mucosa, and tongue normal . No  blisters are noted Musculoskeletal:no cyanosis of digits and no clubbing  NEURO: alert & oriented x 3 with fluent speech, no focal motor/sensory deficits  LABORATORY DATA:  I have reviewed the data as listed    Component Value Date/Time   NA 136 07/30/2015 0840   NA 139 01/13/2015 1730   NA 142 06/01/2012 1008   K 3.3* 07/30/2015 0840   K 4.0 01/13/2015 1730   K 3.8 06/01/2012 1008   CL 103 01/13/2015 1730   CL 107 04/22/2013 0922   CL 103 06/01/2012 1008   CO2 29 07/30/2015 0840   CO2 25 06/13/2014 0819   CO2 25 06/01/2012 1008   GLUCOSE 137 07/30/2015 0840   GLUCOSE 93 01/13/2015 1730   GLUCOSE 89 04/22/2013 0922   GLUCOSE 108 06/01/2012 1008   BUN 7.7 07/30/2015 0840   BUN 12 01/13/2015 1730   BUN 9 06/01/2012 1008   CREATININE 0.7 07/30/2015 0840   CREATININE 0.80 01/13/2015 1730   CREATININE 0.9 06/01/2012 1008   CALCIUM 9.6 07/30/2015 0840   CALCIUM 10.8* 06/13/2014 0819   CALCIUM 9.8 06/01/2012 1008   PROT 5.9* 07/30/2015 0840   PROT 7.5 06/13/2014 0819   PROT 8.1 08/14/2010 1251   ALBUMIN 2.8* 07/30/2015 0840   ALBUMIN 3.9 06/13/2014 0819   AST 17 07/30/2015 0840   AST 23 06/13/2014 0819   AST 19 08/14/2010 1251   ALT 29 07/30/2015 0840   ALT 13 06/13/2014 0819   ALT 9* 08/14/2010 1251   ALKPHOS 80 07/30/2015 0840   ALKPHOS 89 06/13/2014 0819   ALKPHOS 78 08/14/2010 1251   BILITOT 0.81 07/30/2015 0840   BILITOT 0.2* 06/13/2014 0819   BILITOT 0.40 08/14/2010 1251   GFRNONAA 87* 07/06/2012 1122   GFRAA >90 07/06/2012 1122    No results found for: SPEP, UPEP  Lab Results  Component Value Date   WBC 0.6* 07/30/2015   NEUTROABS 0.1* 07/30/2015   HGB 8.7* 07/30/2015   HCT 27.8* 07/30/2015   MCV 75.7* 07/30/2015   PLT 287 07/30/2015      Chemistry      Component Value Date/Time   NA 136 07/30/2015 0840   NA 139 01/13/2015 1730   NA 142 06/01/2012 1008   K 3.3* 07/30/2015 0840   K 4.0 01/13/2015 1730   K 3.8 06/01/2012 1008   CL 103  01/13/2015 1730   CL 107 04/22/2013 0922   CL 103 06/01/2012 1008   CO2 29 07/30/2015 0840   CO2 25 06/13/2014 0819   CO2 25 06/01/2012 1008   BUN 7.7 07/30/2015 0840   BUN 12 01/13/2015 1730   BUN 9 06/01/2012 1008   CREATININE 0.7 07/30/2015 0840   CREATININE 0.80 01/13/2015 1730   CREATININE 0.9 06/01/2012 1008      Component Value Date/Time   CALCIUM 9.6 07/30/2015 0840   CALCIUM 10.8* 06/13/2014 0819   CALCIUM 9.8 06/01/2012 1008   ALKPHOS 80 07/30/2015 0840   ALKPHOS 89 06/13/2014 0819   ALKPHOS 78 08/14/2010 1251   AST 17 07/30/2015 0840   AST 23 06/13/2014 0819   AST 19 08/14/2010 1251   ALT 29 07/30/2015 0840   ALT 13 06/13/2014 0819   ALT 9* 08/14/2010 1251   BILITOT 0.81 07/30/2015 0840   BILITOT 0.2*  06/13/2014 0819   BILITOT 0.40 08/14/2010 1251     ASSESSMENT & PLAN:  Lymphoma, small lymphocytic She tolerated the treatment well apart from just side effects with very mild mucositis and pancytopenia. I will continue to see her on a weekly basis for blood count check and supportive care.  Leukopenia due to antineoplastic chemotherapy This is likely due to recent treatment. She had recent productive cough. She denies recent fever. Because of recent exposure to chemotherapy and current neutropenia, I will proceed to call her with her with 1 week course of oral antibiotics. I discussed with her neutropenic precautions  Anemia due to antineoplastic chemotherapy This is likely due to recent treatment. The patient denies recent history of bleeding such as epistaxis, hematuria or hematochezia. She is asymptomatic from the anemia. I will observe for now.  She does not require transfusion now.  Mucositis due to chemotherapy This is only mild, grade 1. I do not see any blisters or open sores. I recommend conservative management only.   No orders of the defined types were placed in this encounter.   All questions were answered. The patient knows to call the clinic  with any problems, questions or concerns. No barriers to learning was detected. I spent 25 minutes counseling the patient face to face. The total time spent in the appointment was 30 minutes and more than 50% was on counseling and review of test results     [facility], [place], MD 07/30/2015 9:33 AM

## 2015-07-30 NOTE — Progress Notes (Signed)
One month follow up completion of radiation metastatic breast to left neck.Has edema and pain of right jaw.Neutropenic.Has mask to wear.seen by Dr.Gorsuch this morning.Has mild mucositis and cough. BP 114/59 mmHg  Pulse 96  Temp(Src) 97.7 F (36.5 C)  Wt 115 lb 11.2 oz (52.481 kg)  SpO2 100%

## 2015-07-30 NOTE — Assessment & Plan Note (Signed)
She tolerated the treatment well apart from just side effects with very mild mucositis and pancytopenia. I will continue to see her on a weekly basis for blood count check and supportive care.

## 2015-07-30 NOTE — Assessment & Plan Note (Signed)
This is only mild, grade 1. I do not see any blisters or open sores. I recommend conservative management only.

## 2015-07-30 NOTE — Assessment & Plan Note (Signed)
This is likely due to recent treatment. She had recent productive cough. She denies recent fever. Because of recent exposure to chemotherapy and current neutropenia, I will proceed to call her with her with 1 week course of oral antibiotics. I discussed with her neutropenic precautions

## 2015-07-30 NOTE — Assessment & Plan Note (Signed)
This is likely due to recent treatment. The patient denies recent history of bleeding such as epistaxis, hematuria or hematochezia. She is asymptomatic from the anemia. I will observe for now.  She does not require transfusion now. 

## 2015-07-30 NOTE — Progress Notes (Signed)
Radiation Oncology         [phone]) [phone] ________________________________  Name: Helen [name]  MRN: [id number]  Date: 07/30/2015  DOB: 08-Apr-1935  Follow-Up Visit Note  CC: [name], MD  [name], MD  Diagnosis: Lymphoma. The patient also has metastatic left breast cancer (2009).   Interval Since Last Radiation:  2 months  -Radiation treatment dates:06/05/2015 through 06/18/2015   Narrative:  The patient returns today for routine follow-up. One month follow up completion of radiation metastatic breast to left neck. Has edema and pain of right jaw. Neutropenic. Has mask to wear seen by Dr.Gorsuch this morning. Has mild mucositis and cough. Minor hemoptysis when visiting Dr.Gorsuch. Slight,occasional pain in lower right jaw.                       ALLERGIES:  is allergic to codeine; penicillins; sulfonamide derivatives; tape; and tramadol hcl.  Meds: Current Outpatient Prescriptions  Medication Sig Dispense Refill  . HYDROcodone-acetaminophen (NORCO) 5-325 MG per tablet Take 1 tablet by mouth every 6 (six) hours as needed for moderate pain. 60 tablet 0  . lidocaine-prilocaine (EMLA) cream Apply 1 application topically as needed. 30 g 6  . ondansetron (ZOFRAN) 8 MG tablet Take 1 tablet (8 mg total) by mouth every 8 (eight) hours as needed. . 30 tablet 1  . rivaroxaban (XARELTO) 20 MG TABS tablet Take 1 tablet (20 mg total) by mouth daily with supper. 30 tablet 6  . levofloxacin (LEVAQUIN) 500 MG tablet Take 1 tablet (500 mg total) by mouth daily. (Patient not taking: Reported on 07/30/2015) 7 tablet 0  . predniSONE (DELTASONE) 20 MG tablet Take 3 tablets (60 mg total) by mouth daily. Take on days 2-5 of chemotherapy. (Patient not taking: Reported on 07/30/2015) 12 tablet 6   No current facility-administered medications for this encounter.    Physical Findings: The patient is in no acute distress. Patient is alert and oriented.  weight is 115 lb 11.2 oz (52.481 kg). Her  temperature is 97.7 F (36.5 C). Her blood pressure is 114/59 and her pulse is 96. Her oxygen saturation is 100%. .   General: Well-developed, in no acute distress Oral cavity clear Right face/jaw shows some edema. No nodularity present. Respiratory: Clear to auscultation bilaterally Right neck has healed well from radiation. Lympahdenopathy significantly decreased. Palpable lymphadenopathy in the right axilla.   Lab Findings: Lab Results  Component Value Date   WBC 0.6* 07/30/2015   HGB 8.7* 07/30/2015   HCT 27.8* 07/30/2015   MCV 75.7* 07/30/2015   PLT 287 07/30/2015   Radiographic Findings: Ct Chest W Contrast  07/02/2015   CLINICAL DATA:  80 year old female with history of left-sided breast cancer diagnosed in 2009 and lymphoma/leukemia diagnosed in 2012, status post left-sided mastectomy and radiation therapy with ongoing chemotherapy. Restaging examination.  EXAM: CT CHEST, ABDOMEN, AND PELVIS WITH CONTRAST  TECHNIQUE: Multidetector CT imaging of the chest, abdomen and pelvis was performed following the standard protocol during bolus administration of intravenous contrast.  CONTRAST:  151mL OMNIPAQUE IOHEXOL 300 MG/ML  SOLN  COMPARISON:  CT of the chest, abdomen and pelvis 03/30/2015.  FINDINGS: CT CHEST FINDINGS  Mediastinum/Lymph Nodes: Heart size is borderline enlarged. There is no significant pericardial fluid, thickening or pericardial calcification. Multiple mildly enlarged right peritracheal lymph nodes measuring up to 11 mm in short axis appear increased in size compared to the prior study. No definite hilar lymphadenopathy. Esophagus is unremarkable in appearance. No left axillary lymphadenopathy. In  the right axilla and subpectoral region there are multiple enlarged lymph nodes which have significantly increased in size compared to the prior study, with the largest node measuring up to 3.3 cm in short axis (image 22 of series 2).  Lungs/Pleura: Again noted is a large mass-like  area of airspace consolidation in the left lower lobe which appears slightly increased in size compared to the prior study, estimated to measure approximately 6.0 x 3.8 cm on today's study (image 35 of series 4), concerning for an area of pulmonary lymphomatous involvement given its persistence compared to the prior study. Subpleural reticulation throughout the anterior aspect of the left upper lobe immediately deep to the left breast is presumably related to chronic postradiation fibrosis, as are the fibrotic changes in the apex of the left upper lobe. Bandlike atelectasis in the inferior aspect of the right lower lobe is unchanged. Small right pleural effusion lying dependently is new compared to the prior study. Previously noted small left pleural effusion has resolved.  Musculoskeletal/Soft Tissues: Status post left modified radical mastectomy. Large lucent lesion in the right humeral head/neck, concerning for a metastatic lesion, similar in retrospect to prior studies.  CT ABDOMEN AND PELVIS FINDINGS  Hepatobiliary: Multiple previously noted hypovascular hepatic lesions appear increased in number and size compared to prior examinations, with the largest of these lesions including a 5.5 x 3.8 cm lesion in the inferior aspect of the right lobe of the liver (image 52 of series 2), which previously measured only 2.4 x 3.6 cm on 03/30/2015. Another enlarging lesion is a 2.6 x 2.1 cm hypovascular lesion in the central aspect of segment 4A of the liver (image 41 of series 2), which previously measured only 1.7 cm. New 2.9 x 1.6 cm hypovascular lesion in segment 3 of the liver (image 56 of series 2). No intra or extrahepatic biliary ductal dilatation. Large 2.4 cm gallstone with faint rim calcification in the gallbladder. No current findings to suggest an acute cholecystitis at this time.  Pancreas: No pancreatic mass. No pancreatic ductal dilatation. No pancreatic or peripancreatic fluid or inflammatory changes.   Spleen: New 1.1 cm hypovascular lesion in the spleen (image 47 of series 2).  Adrenals/Urinary Tract: Tiny calcification in the medial limb of the right adrenal gland is unchanged, presumably related to prior infection or hemorrhage. Left adrenal gland is normal in appearance. Multiple sub cm low-attenuation lesions in the kidneys bilaterally are too small to definitively characterize, but are similar to prior studies, favored to represent tiny cysts. In addition, it is in the lower pole of the left kidney there is a 1.6 cm well-defined low-attenuation lesion with a thin internal septation, compatible with a Bosniak class 2 cyst. No hydroureteronephrosis. Urinary bladder is normal in appearance.  Stomach/Bowel: The appearance of the stomach is normal. No pathologic dilatation of small bowel or colon.  Vascular/Lymphatic: Atherosclerosis throughout the abdominal and pelvic vasculature, without evidence of aneurysm or dissection. Numerous enlarged lymph nodes throughout the abdomen and pelvis measuring up to 11 mm in the left para-aortic nodal station, 16 mm in short axis in the left external iliac nodal station, and 14 mm in short axis in the left inguinal region. These all appear larger than prior study 03/30/2015.  Reproductive: Heterogeneously enlarged and heterogeneously calcified uterus, compatible with a fibroid uterus. Ovaries are atrophic.  Other: Tiny paraumbilical hernia containing only a small amount of omental fat. No significant volume of ascites. No pneumoperitoneum.  Musculoskeletal: Numerous predominantly lytic lesions noted throughout the pelvis  and spine, some of which show some interval sclerosis (best typified by a 2.7 x 1.8 cm lesion in the right iliac crest which is more sclerotic than the prior study), suggesting some interval healing. Large lytic lesion in the posterior aspect of the L4 vertebral body measuring 3.3 x 2.3 cm with some posterior extension into the anterior epidural space,  similar to the prior examination (image 66 of series 2). Bilateral pars defects at L5 with 10 mm of anterolisthesis of L5 upon S1.  IMPRESSION: 1. Today's study demonstrates progression of disease, with worsening lymphadenopathy throughout the chest, abdomen and pelvis, as detailed above. In addition, there is an increased number and size of numerous hepatic lesions, a new splenic lesion, and persistence of a mass-like opacity in the left lower lobe which is concerning for probable lymphomatous involvement in the lung. 2. Numerous previously noted osseous lesions appear generally similar to the prior study, with some evidence of interval bony healing in certain lesions, as discussed above. 3. Previously noted small left pleural effusion has resolved. However, there is a new small right-sided pleural effusion layering dependently. 4. Cholelithiasis without evidence of acute cholecystitis at this time. 5. Additional incidental findings, as above. These results will be called to the ordering clinician or representative by the Radiologist Assistant, and communication documented in the PACS or zVision Dashboard.   Electronically Signed   By: Vinnie Langton M.D.   On: 07/02/2015 11:25   Ct Abdomen Pelvis W Contrast  07/02/2015   CLINICAL DATA:  80 year old female with history of left-sided breast cancer diagnosed in 2009 and lymphoma/leukemia diagnosed in 2012, status post left-sided mastectomy and radiation therapy with ongoing chemotherapy. Restaging examination.  EXAM: CT CHEST, ABDOMEN, AND PELVIS WITH CONTRAST  TECHNIQUE: Multidetector CT imaging of the chest, abdomen and pelvis was performed following the standard protocol during bolus administration of intravenous contrast.  CONTRAST:  167mL OMNIPAQUE IOHEXOL 300 MG/ML  SOLN  COMPARISON:  CT of the chest, abdomen and pelvis 03/30/2015.  FINDINGS: CT CHEST FINDINGS  Mediastinum/Lymph Nodes: Heart size is borderline enlarged. There is no significant pericardial  fluid, thickening or pericardial calcification. Multiple mildly enlarged right peritracheal lymph nodes measuring up to 11 mm in short axis appear increased in size compared to the prior study. No definite hilar lymphadenopathy. Esophagus is unremarkable in appearance. No left axillary lymphadenopathy. In the right axilla and subpectoral region there are multiple enlarged lymph nodes which have significantly increased in size compared to the prior study, with the largest node measuring up to 3.3 cm in short axis (image 22 of series 2).  Lungs/Pleura: Again noted is a large mass-like area of airspace consolidation in the left lower lobe which appears slightly increased in size compared to the prior study, estimated to measure approximately 6.0 x 3.8 cm on today's study (image 35 of series 4), concerning for an area of pulmonary lymphomatous involvement given its persistence compared to the prior study. Subpleural reticulation throughout the anterior aspect of the left upper lobe immediately deep to the left breast is presumably related to chronic postradiation fibrosis, as are the fibrotic changes in the apex of the left upper lobe. Bandlike atelectasis in the inferior aspect of the right lower lobe is unchanged. Small right pleural effusion lying dependently is new compared to the prior study. Previously noted small left pleural effusion has resolved.  Musculoskeletal/Soft Tissues: Status post left modified radical mastectomy. Large lucent lesion in the right humeral head/neck, concerning for a metastatic lesion, similar in retrospect  to prior studies.  CT ABDOMEN AND PELVIS FINDINGS  Hepatobiliary: Multiple previously noted hypovascular hepatic lesions appear increased in number and size compared to prior examinations, with the largest of these lesions including a 5.5 x 3.8 cm lesion in the inferior aspect of the right lobe of the liver (image 52 of series 2), which previously measured only 2.4 x 3.6 cm on  03/30/2015. Another enlarging lesion is a 2.6 x 2.1 cm hypovascular lesion in the central aspect of segment 4A of the liver (image 41 of series 2), which previously measured only 1.7 cm. New 2.9 x 1.6 cm hypovascular lesion in segment 3 of the liver (image 56 of series 2). No intra or extrahepatic biliary ductal dilatation. Large 2.4 cm gallstone with faint rim calcification in the gallbladder. No current findings to suggest an acute cholecystitis at this time.  Pancreas: No pancreatic mass. No pancreatic ductal dilatation. No pancreatic or peripancreatic fluid or inflammatory changes.  Spleen: New 1.1 cm hypovascular lesion in the spleen (image 47 of series 2).  Adrenals/Urinary Tract: Tiny calcification in the medial limb of the right adrenal gland is unchanged, presumably related to prior infection or hemorrhage. Left adrenal gland is normal in appearance. Multiple sub cm low-attenuation lesions in the kidneys bilaterally are too small to definitively characterize, but are similar to prior studies, favored to represent tiny cysts. In addition, it is in the lower pole of the left kidney there is a 1.6 cm well-defined low-attenuation lesion with a thin internal septation, compatible with a Bosniak class 2 cyst. No hydroureteronephrosis. Urinary bladder is normal in appearance.  Stomach/Bowel: The appearance of the stomach is normal. No pathologic dilatation of small bowel or colon.  Vascular/Lymphatic: Atherosclerosis throughout the abdominal and pelvic vasculature, without evidence of aneurysm or dissection. Numerous enlarged lymph nodes throughout the abdomen and pelvis measuring up to 11 mm in the left para-aortic nodal station, 16 mm in short axis in the left external iliac nodal station, and 14 mm in short axis in the left inguinal region. These all appear larger than prior study 03/30/2015.  Reproductive: Heterogeneously enlarged and heterogeneously calcified uterus, compatible with a fibroid uterus. Ovaries  are atrophic.  Other: Tiny paraumbilical hernia containing only a small amount of omental fat. No significant volume of ascites. No pneumoperitoneum.  Musculoskeletal: Numerous predominantly lytic lesions noted throughout the pelvis and spine, some of which show some interval sclerosis (best typified by a 2.7 x 1.8 cm lesion in the right iliac crest which is more sclerotic than the prior study), suggesting some interval healing. Large lytic lesion in the posterior aspect of the L4 vertebral body measuring 3.3 x 2.3 cm with some posterior extension into the anterior epidural space, similar to the prior examination (image 66 of series 2). Bilateral pars defects at L5 with 10 mm of anterolisthesis of L5 upon S1.  IMPRESSION: 1. Today's study demonstrates progression of disease, with worsening lymphadenopathy throughout the chest, abdomen and pelvis, as detailed above. In addition, there is an increased number and size of numerous hepatic lesions, a new splenic lesion, and persistence of a mass-like opacity in the left lower lobe which is concerning for probable lymphomatous involvement in the lung. 2. Numerous previously noted osseous lesions appear generally similar to the prior study, with some evidence of interval bony healing in certain lesions, as discussed above. 3. Previously noted small left pleural effusion has resolved. However, there is a new small right-sided pleural effusion layering dependently. 4. Cholelithiasis without evidence of acute  cholecystitis at this time. 5. Additional incidental findings, as above. These results will be called to the ordering clinician or representative by the Radiologist Assistant, and communication documented in the PACS or zVision Dashboard.   Electronically Signed   By: Vinnie Langton M.D.   On: 07/02/2015 11:25    Impression: The patient is recovering from the effects of radiation. She continues with systemic treatment through medical oncology.  Plan:  Continuing  systemic treatment with Dr. Alvy Bimler. I have reviewed patient's recent CT scan showing progression but no particular areas where she would require radiation at this point. Given the patient's progressive widespread disease, I would advocate being selective in terms of using palliative radiation treatment at this point. I would be more than happy to see the patient if palliative radiation treatment appears warranted with a specific area of concern.Follow up as needed.   ------------------------------------------------  Jodelle Gross, MD, PhD  This document serves as a record of services personally performed by Kyung Rudd, MD. It was created on his behalf by Derek Mound, a trained medical scribe. The creation of this record is based on the scribe's personal observations and the provider's statements to them. This document has been checked and approved by the attending provider.

## 2015-08-06 ENCOUNTER — Telehealth: Payer: Self-pay | Admitting: Hematology and Oncology

## 2015-08-06 ENCOUNTER — Encounter: Payer: Self-pay | Admitting: Hematology and Oncology

## 2015-08-06 ENCOUNTER — Ambulatory Visit (HOSPITAL_BASED_OUTPATIENT_CLINIC_OR_DEPARTMENT_OTHER): Payer: Medicare Other | Admitting: Hematology and Oncology

## 2015-08-06 ENCOUNTER — Ambulatory Visit (HOSPITAL_COMMUNITY)
Admission: RE | Admit: 2015-08-06 | Discharge: 2015-08-06 | Disposition: A | Discharge status: Home / Self Care | Payer: Medicare Other | Source: Ambulatory Visit | Attending: Hematology and Oncology | Admitting: Hematology and Oncology

## 2015-08-06 ENCOUNTER — Ambulatory Visit (HOSPITAL_BASED_OUTPATIENT_CLINIC_OR_DEPARTMENT_OTHER): Payer: Medicare Other

## 2015-08-06 ENCOUNTER — Other Ambulatory Visit (HOSPITAL_BASED_OUTPATIENT_CLINIC_OR_DEPARTMENT_OTHER): Payer: Medicare Other

## 2015-08-06 VITALS — BP 103/43 | HR 86 | Temp 98.4°F | Resp 18

## 2015-08-06 VITALS — BP 126/52 | HR 100 | Temp 97.8°F | Resp 18 | Ht 62.0 in | Wt 112.0 lb

## 2015-08-06 DIAGNOSIS — T451X5A Adverse effect of antineoplastic and immunosuppressive drugs, initial encounter: Secondary | ICD-10-CM

## 2015-08-06 DIAGNOSIS — C7951 Secondary malignant neoplasm of bone: Secondary | ICD-10-CM

## 2015-08-06 DIAGNOSIS — D72829 Elevated white blood cell count, unspecified: Secondary | ICD-10-CM

## 2015-08-06 DIAGNOSIS — C8300 Small cell B-cell lymphoma, unspecified site: Secondary | ICD-10-CM

## 2015-08-06 DIAGNOSIS — D630 Anemia in neoplastic disease: Secondary | ICD-10-CM

## 2015-08-06 DIAGNOSIS — D6481 Anemia due to antineoplastic chemotherapy: Secondary | ICD-10-CM

## 2015-08-06 DIAGNOSIS — C50912 Malignant neoplasm of unspecified site of left female breast: Secondary | ICD-10-CM

## 2015-08-06 LAB — COMPREHENSIVE METABOLIC PANEL (CC13)
ALK PHOS: 107 U/L (ref 40–150)
ALT: 20 U/L (ref 0–55)
ANION GAP: 10 meq/L (ref 3–11)
AST: 14 U/L (ref 5–34)
Albumin: 2.8 g/dL — ABNORMAL LOW (ref 3.5–5.0)
BUN: 4.7 mg/dL — ABNORMAL LOW (ref 7.0–26.0)
CO2: 27 meq/L (ref 22–29)
Calcium: 8.9 mg/dL (ref 8.4–10.4)
Chloride: 106 mEq/L (ref 98–109)
Creatinine: 0.8 mg/dL (ref 0.6–1.1)
EGFR: 82 mL/min/{1.73_m2} — AB (ref >90)
GLUCOSE: 129 mg/dL (ref 70–140)
POTASSIUM: 3.3 meq/L — AB (ref 3.5–5.1)
SODIUM: 142 meq/L (ref 136–145)
Total Bilirubin: <0.3 mg/dL (ref 0.20–1.20)
Total Protein: 5.8 g/dL — ABNORMAL LOW (ref 6.4–8.3)

## 2015-08-06 LAB — CBC WITH DIFFERENTIAL/PLATELET
BASO%: 0.2 % (ref 0.0–2.0)
Basophils Absolute: 0 10*3/uL (ref 0.0–0.1)
EOS%: 0.6 % (ref 0.0–7.0)
Eosinophils Absolute: 0.1 10*3/uL (ref 0.0–0.5)
HCT: 25.9 % — ABNORMAL LOW (ref 34.8–46.6)
HGB: 8 g/dL — ABNORMAL LOW (ref 11.6–15.9)
LYMPH%: 1.2 % — AB (ref 14.0–49.7)
MCH: 23.2 pg — ABNORMAL LOW (ref 25.1–34.0)
MCHC: 31 g/dL — ABNORMAL LOW (ref 31.5–36.0)
MCV: 74.8 fL — ABNORMAL LOW (ref 79.5–101.0)
MONO#: 1.8 10*3/uL — ABNORMAL HIGH (ref 0.1–0.9)
MONO%: 7.7 % (ref 0.0–14.0)
NEUT%: 90.3 % — ABNORMAL HIGH (ref 38.4–76.8)
NEUTROS ABS: 21.4 10*3/uL — AB (ref 1.5–6.5)
Platelets: 282 10*3/uL (ref 145–400)
RBC: 3.46 10*6/uL — AB (ref 3.70–5.45)
RDW: 19.2 % — ABNORMAL HIGH (ref 11.2–14.5)
WBC: 23.7 10*3/uL — AB (ref 3.9–10.3)
lymph#: 0.3 10*3/uL — ABNORMAL LOW (ref 0.9–3.3)

## 2015-08-06 LAB — HOLD TUBE, BLOOD BANK

## 2015-08-06 LAB — PREPARE RBC (CROSSMATCH)

## 2015-08-06 MED ORDER — ACETAMINOPHEN 325 MG PO TABS
650.0000 mg | ORAL_TABLET | Freq: Once | ORAL | Status: AC
  Administered 2015-08-06: 650 mg via ORAL

## 2015-08-06 MED ORDER — HEPARIN SOD (PORK) LOCK FLUSH 100 UNIT/ML IV SOLN
500.0000 [IU] | Freq: Every day | INTRAVENOUS | Status: AC | PRN
  Administered 2015-08-06: 500 [IU]
  Filled 2015-08-06: qty 5

## 2015-08-06 MED ORDER — DIPHENHYDRAMINE HCL 25 MG PO CAPS
ORAL_CAPSULE | ORAL | Status: AC
Start: 2015-08-06 — End: 2015-08-06
  Filled 2015-08-06: qty 1

## 2015-08-06 MED ORDER — ACETAMINOPHEN 325 MG PO TABS
ORAL_TABLET | ORAL | Status: AC
  Filled 2015-08-06: qty 2

## 2015-08-06 MED ORDER — SODIUM CHLORIDE 0.9 % IV SOLN
250.0000 mL | Freq: Once | INTRAVENOUS | Status: AC
  Administered 2015-08-06: 250 mL via INTRAVENOUS

## 2015-08-06 MED ORDER — SODIUM CHLORIDE 0.9 % IJ SOLN
10.0000 mL | INTRAMUSCULAR | Status: AC | PRN
  Administered 2015-08-06: 10 mL
  Filled 2015-08-06: qty 10

## 2015-08-06 MED ORDER — DIPHENHYDRAMINE HCL 25 MG PO CAPS
25.0000 mg | ORAL_CAPSULE | Freq: Once | ORAL | Status: AC
  Administered 2015-08-06: 25 mg via ORAL

## 2015-08-06 NOTE — Telephone Encounter (Signed)
Pt confirmed labs/ov per 10/03 POF, gave pt AVS and Calendar... KJ °

## 2015-08-06 NOTE — Assessment & Plan Note (Addendum)
She tolerated the treatment well apart from just side effects with very mild mucositis and anemia. Continue supportive care. I plan to see her again next week prior to cycle 2 of treatment.

## 2015-08-06 NOTE — Progress Notes (Signed)
[facility] OFFICE PROGRESS NOTE  Patient Care Team: [name], MD as PCP - General (Hematology and Oncology) [name], MD as Consulting Physician (Hematology and Oncology)  SUMMARY OF ONCOLOGIC HISTORY:   Cancer of left breast ([place])   08/03/2008 Initial Diagnosis Breast CA   11/06/2014 Imaging Repeat CT scan of the chest, abdomen and pelvis show regression in the size of liver metastasis and pulmonary metastasis with mild progression of lymphadenopathy   11/06/2014 Tumor Marker [id number] is elevated at 275   01/18/2015 Imaging MRI head is negative for metastatic disease   01/18/2015 Imaging CT scan of the chest, abdomen and pelvis show significant disease progression throughout.   01/25/2015 - 05/15/2015 Chemotherapy She started Gemzar every other week and Ibrutinib. Treatment is discontinued due to progression   02/20/2015 Tumor Marker CA 27-29 at 246   03/30/2015 Imaging Repeat CT scan of the chest, abdomen and pelvis show positive response to treatment for lymphoma. Liver lesions are stable.   06/05/2015 - 06/18/2015 Radiation Therapy  she completed radiation treatment to the right supraclavicular region   07/23/2015 -  Chemotherapy She recieved cycle 1 of RCHOP for SLL    INTERVAL HISTORY: Please see below for problem oriented charting. She returns for further follow-up. She denies further nasal congestion, fevers or cough. Mucositis has improved. She complained of fatigue.  REVIEW OF SYSTEMS:   Constitutional: Denies fevers, chills or abnormal weight loss Eyes: Denies blurriness of vision Respiratory: Denies cough, dyspnea or wheezes Cardiovascular: Denies palpitation, chest discomfort or lower extremity swelling Gastrointestinal:  Denies nausea, heartburn or change in bowel habits Skin: Denies abnormal skin rashes Lymphatics: Denies new lymphadenopathy or easy bruising Neurological:Denies numbness, tingling or new weaknesses Behavioral/Psych: Mood is stable, no new  changes  All other systems were reviewed with the patient and are negative.  I have reviewed the past medical history, past surgical history, social history and family history with the patient and they are unchanged from previous note.  ALLERGIES:  is allergic to codeine; penicillins; sulfonamide derivatives; tape; and tramadol hcl.  MEDICATIONS:  Current Outpatient Prescriptions  Medication Sig Dispense Refill  . HYDROcodone-acetaminophen (NORCO) 5-325 MG per tablet Take 1 tablet by mouth every 6 (six) hours as needed for moderate pain. 60 tablet 0  . lidocaine-prilocaine (EMLA) cream Apply 1 application topically as needed. 30 g 6  . rivaroxaban (XARELTO) 20 MG TABS tablet Take 1 tablet (20 mg total) by mouth daily with supper. 30 tablet 6  . ondansetron (ZOFRAN) 8 MG tablet Take 1 tablet (8 mg total) by mouth every 8 (eight) hours as needed. . (Patient not taking: Reported on 08/06/2015) 30 tablet 1  . predniSONE (DELTASONE) 20 MG tablet Take 3 tablets (60 mg total) by mouth daily. Take on days 2-5 of chemotherapy. (Patient not taking: Reported on 07/30/2015) 12 tablet 6   No current facility-administered medications for this visit.   Facility-Administered Medications Ordered in Other Visits  Medication Dose Route Frequency Provider Last Rate Last Dose  . heparin lock flush 100 unit/mL  500 Units Intracatheter Daily PRN Maxum Cassarino, MD      . sodium chloride 0.9 % injection 10 mL  10 mL Intracatheter PRN [name], MD        PHYSICAL EXAMINATION: ECOG PERFORMANCE STATUS: 0 - Asymptomatic  Filed Vitals:   08/06/15 1001  BP: 126/52  Pulse: 100  Temp: 97.8 F (36.6 C)  Resp: 18   Filed Weights   08/06/15 1001  Weight: 112  lb (50.803 kg)    GENERAL:alert, no distress and comfortable SKIN: skin color, texture, turgor are normal, no rashes or significant lesions EYES: normal, Conjunctiva are pale and non-injected, sclera clear  Musculoskeletal:no cyanosis of digits and no  clubbing  NEURO: alert & oriented x 3 with fluent speech, no focal motor/sensory deficits  LABORATORY DATA:  I have reviewed the data as listed    Component Value Date/Time   NA 142 08/06/2015 0951   NA 139 01/13/2015 1730   NA 142 06/01/2012 1008   K 3.3* 08/06/2015 0951   K 4.0 01/13/2015 1730   K 3.8 06/01/2012 1008   CL 103 01/13/2015 1730   CL 107 04/22/2013 0922   CL 103 06/01/2012 1008   CO2 27 08/06/2015 0951   CO2 25 06/13/2014 0819   CO2 25 06/01/2012 1008   GLUCOSE 129 08/06/2015 0951   GLUCOSE 93 01/13/2015 1730   GLUCOSE 89 04/22/2013 0922   GLUCOSE 108 06/01/2012 1008   BUN 4.7* 08/06/2015 0951   BUN 12 01/13/2015 1730   BUN 9 06/01/2012 1008   CREATININE 0.8 08/06/2015 0951   CREATININE 0.80 01/13/2015 1730   CREATININE 0.9 06/01/2012 1008   CALCIUM 8.9 08/06/2015 0951   CALCIUM 10.8* 06/13/2014 0819   CALCIUM 9.8 06/01/2012 1008   PROT 5.8* 08/06/2015 0951   PROT 7.5 06/13/2014 0819   PROT 8.1 08/14/2010 1251   ALBUMIN 2.8* 08/06/2015 0951   ALBUMIN 3.9 06/13/2014 0819   AST 14 08/06/2015 0951   AST 23 06/13/2014 0819   AST 19 08/14/2010 1251   ALT 20 08/06/2015 0951   ALT 13 06/13/2014 0819   ALT 9* 08/14/2010 1251   ALKPHOS 107 08/06/2015 0951   ALKPHOS 89 06/13/2014 0819   ALKPHOS 78 08/14/2010 1251   BILITOT <0.30 08/06/2015 0951   BILITOT 0.2* 06/13/2014 0819   BILITOT 0.40 08/14/2010 1251   GFRNONAA 87* 07/06/2012 1122   GFRAA >90 07/06/2012 1122    No results found for: SPEP, UPEP  Lab Results  Component Value Date   WBC 23.7* 08/06/2015   NEUTROABS 21.4* 08/06/2015   HGB 8.0* 08/06/2015   HCT 25.9* 08/06/2015   MCV 74.8* 08/06/2015   PLT 282 08/06/2015      Chemistry      Component Value Date/Time   NA 142 08/06/2015 0951   NA 139 01/13/2015 1730   NA 142 06/01/2012 1008   K 3.3* 08/06/2015 0951   K 4.0 01/13/2015 1730   K 3.8 06/01/2012 1008   CL 103 01/13/2015 1730   CL 107 04/22/2013 0922   CL 103 06/01/2012 1008    CO2 27 08/06/2015 0951   CO2 25 06/13/2014 0819   CO2 25 06/01/2012 1008   BUN 4.7* 08/06/2015 0951   BUN 12 01/13/2015 1730   BUN 9 06/01/2012 1008   CREATININE 0.8 08/06/2015 0951   CREATININE 0.80 01/13/2015 1730   CREATININE 0.9 06/01/2012 1008      Component Value Date/Time   CALCIUM 8.9 08/06/2015 0951   CALCIUM 10.8* 06/13/2014 0819   CALCIUM 9.8 06/01/2012 1008   ALKPHOS 107 08/06/2015 0951   ALKPHOS 89 06/13/2014 0819   ALKPHOS 78 08/14/2010 1251   AST 14 08/06/2015 0951   AST 23 06/13/2014 0819   AST 19 08/14/2010 1251   ALT 20 08/06/2015 0951   ALT 13 06/13/2014 0819   ALT 9* 08/14/2010 1251   BILITOT <0.30 08/06/2015 0951   BILITOT 0.2* 06/13/2014 0819   BILITOT  0.40 08/14/2010 1251      ASSESSMENT & PLAN:   Lymphoma, small lymphocytic She tolerated the treatment well apart from just side effects with very mild mucositis and anemia. Continue supportive care. I plan to see her again next week prior to cycle 2 of treatment.   Anemia due to antineoplastic chemotherapy We discussed some of the risks, benefits, and alternatives of blood transfusions. The patient is symptomatic from anemia and the hemoglobin level is critically low.  Some of the side-effects to be expected including risks of transfusion reactions, chills, infection, syndrome of volume overload and risk of hospitalization from various reasons and the patient is willing to proceed and went ahead to sign consent today. I will proceed to give her 1 unit of blood whenever hemoglobin is 8g or less.  Leukocytosis This is likely related to recent G-CSF. She has recovered from recent antibiotics treatment. I told her to discontinue neutropenic precaution.     All questions were answered. The patient knows to call the clinic with any problems, questions or concerns. No barriers to learning was detected. I spent 15 minutes counseling the patient face to face. The total time spent in the appointment was  20 minutes and more than 50% was on counseling and review of test results     [facility], [place], MD 08/06/2015 12:41 PM

## 2015-08-06 NOTE — Assessment & Plan Note (Signed)
We discussed some of the risks, benefits, and alternatives of blood transfusions. The patient is symptomatic from anemia and the hemoglobin level is critically low.  Some of the side-effects to be expected including risks of transfusion reactions, chills, infection, syndrome of volume overload and risk of hospitalization from various reasons and the patient is willing to proceed and went ahead to sign consent today. I will proceed to give her 1 unit of blood whenever hemoglobin is 8g or less.

## 2015-08-06 NOTE — Assessment & Plan Note (Signed)
This is likely related to recent G-CSF. She has recovered from recent antibiotics treatment. I told her to discontinue neutropenic precaution.

## 2015-08-06 NOTE — Patient Instructions (Signed)

## 2015-08-07 LAB — TYPE AND SCREEN
ABO/RH(D): O POS
Antibody Screen: NEGATIVE
UNIT DIVISION: 0

## 2015-08-13 ENCOUNTER — Ambulatory Visit: Payer: Medicare Other

## 2015-08-13 ENCOUNTER — Ambulatory Visit (HOSPITAL_BASED_OUTPATIENT_CLINIC_OR_DEPARTMENT_OTHER): Payer: Medicare Other | Admitting: Hematology and Oncology

## 2015-08-13 ENCOUNTER — Ambulatory Visit (HOSPITAL_BASED_OUTPATIENT_CLINIC_OR_DEPARTMENT_OTHER): Payer: Medicare Other

## 2015-08-13 ENCOUNTER — Ambulatory Visit: Payer: Medicare Other | Admitting: Nutrition

## 2015-08-13 ENCOUNTER — Other Ambulatory Visit (HOSPITAL_BASED_OUTPATIENT_CLINIC_OR_DEPARTMENT_OTHER): Payer: Medicare Other

## 2015-08-13 ENCOUNTER — Encounter: Payer: Self-pay | Admitting: Hematology and Oncology

## 2015-08-13 ENCOUNTER — Telehealth: Payer: Self-pay | Admitting: Hematology and Oncology

## 2015-08-13 VITALS — BP 102/52 | HR 70 | Temp 97.9°F | Resp 20

## 2015-08-13 VITALS — BP 121/54 | HR 96 | Temp 97.9°F | Resp 18 | Ht 62.0 in | Wt 110.6 lb

## 2015-08-13 DIAGNOSIS — K1231 Oral mucositis (ulcerative) due to antineoplastic therapy: Secondary | ICD-10-CM

## 2015-08-13 DIAGNOSIS — C7951 Secondary malignant neoplasm of bone: Secondary | ICD-10-CM | POA: Diagnosis not present

## 2015-08-13 DIAGNOSIS — Z5111 Encounter for antineoplastic chemotherapy: Secondary | ICD-10-CM

## 2015-08-13 DIAGNOSIS — C50912 Malignant neoplasm of unspecified site of left female breast: Secondary | ICD-10-CM

## 2015-08-13 DIAGNOSIS — Z5112 Encounter for antineoplastic immunotherapy: Secondary | ICD-10-CM | POA: Diagnosis not present

## 2015-08-13 DIAGNOSIS — C8300 Small cell B-cell lymphoma, unspecified site: Secondary | ICD-10-CM | POA: Diagnosis present

## 2015-08-13 DIAGNOSIS — T451X5A Adverse effect of antineoplastic and immunosuppressive drugs, initial encounter: Secondary | ICD-10-CM

## 2015-08-13 DIAGNOSIS — D6481 Anemia due to antineoplastic chemotherapy: Secondary | ICD-10-CM | POA: Diagnosis not present

## 2015-08-13 DIAGNOSIS — I82C11 Acute embolism and thrombosis of right internal jugular vein: Secondary | ICD-10-CM

## 2015-08-13 DIAGNOSIS — E46 Unspecified protein-calorie malnutrition: Secondary | ICD-10-CM | POA: Diagnosis not present

## 2015-08-13 LAB — COMPREHENSIVE METABOLIC PANEL (CC13)
ALBUMIN: 2.7 g/dL — AB (ref 3.5–5.0)
ALK PHOS: 87 U/L (ref 40–150)
ALT: 20 U/L (ref 0–55)
AST: 22 U/L (ref 5–34)
Anion Gap: 10 mEq/L (ref 3–11)
BUN: 4.8 mg/dL — ABNORMAL LOW (ref 7.0–26.0)
CO2: 24 mEq/L (ref 22–29)
Calcium: 8.5 mg/dL (ref 8.4–10.4)
Chloride: 104 mEq/L (ref 98–109)
Creatinine: 0.6 mg/dL (ref 0.6–1.1)
GLUCOSE: 112 mg/dL (ref 70–140)
POTASSIUM: 3.8 meq/L (ref 3.5–5.1)
SODIUM: 137 meq/L (ref 136–145)
Total Bilirubin: 0.34 mg/dL (ref 0.20–1.20)
Total Protein: 5.7 g/dL — ABNORMAL LOW (ref 6.4–8.3)

## 2015-08-13 LAB — CBC WITH DIFFERENTIAL/PLATELET
BASO%: 1 % (ref 0.0–2.0)
Basophils Absolute: 0.1 10*3/uL (ref 0.0–0.1)
EOS ABS: 0.1 10*3/uL (ref 0.0–0.5)
EOS%: 0.8 % (ref 0.0–7.0)
HEMATOCRIT: 28.4 % — AB (ref 34.8–46.6)
HEMOGLOBIN: 9 g/dL — AB (ref 11.6–15.9)
LYMPH#: 0.1 10*3/uL — AB (ref 0.9–3.3)
LYMPH%: 1.8 % — ABNORMAL LOW (ref 14.0–49.7)
MCH: 24.5 pg — ABNORMAL LOW (ref 25.1–34.0)
MCHC: 31.6 g/dL (ref 31.5–36.0)
MCV: 77.4 fL — AB (ref 79.5–101.0)
MONO#: 1.3 10*3/uL — AB (ref 0.1–0.9)
MONO%: 17.3 % — ABNORMAL HIGH (ref 0.0–14.0)
NEUT%: 79.1 % — ABNORMAL HIGH (ref 38.4–76.8)
NEUTROS ABS: 6 10*3/uL (ref 1.5–6.5)
PLATELETS: 365 10*3/uL (ref 145–400)
RBC: 3.67 10*6/uL — ABNORMAL LOW (ref 3.70–5.45)
RDW: 20.9 % — AB (ref 11.2–14.5)
WBC: 7.6 10*3/uL (ref 3.9–10.3)

## 2015-08-13 LAB — HOLD TUBE, BLOOD BANK

## 2015-08-13 MED ORDER — HEPARIN SOD (PORK) LOCK FLUSH 100 UNIT/ML IV SOLN
500.0000 [IU] | Freq: Once | INTRAVENOUS | Status: AC | PRN
  Administered 2015-08-13: 500 [IU]
  Filled 2015-08-13: qty 5

## 2015-08-13 MED ORDER — ACETAMINOPHEN 325 MG PO TABS
ORAL_TABLET | ORAL | Status: AC
  Filled 2015-08-13: qty 2

## 2015-08-13 MED ORDER — DIPHENHYDRAMINE HCL 25 MG PO CAPS
ORAL_CAPSULE | ORAL | Status: AC
  Filled 2015-08-13: qty 1

## 2015-08-13 MED ORDER — DOXORUBICIN HCL CHEMO IV INJECTION 2 MG/ML
25.0000 mg/m2 | Freq: Once | INTRAVENOUS | Status: AC
  Administered 2015-08-13: 38 mg via INTRAVENOUS
  Filled 2015-08-13: qty 19

## 2015-08-13 MED ORDER — SODIUM CHLORIDE 0.9 % IV SOLN
Freq: Once | INTRAVENOUS | Status: AC
  Administered 2015-08-13: 13:00:00 via INTRAVENOUS

## 2015-08-13 MED ORDER — SODIUM CHLORIDE 0.9 % IV SOLN
Freq: Once | INTRAVENOUS | Status: AC
  Administered 2015-08-13: 13:00:00 via INTRAVENOUS
  Filled 2015-08-13: qty 8

## 2015-08-13 MED ORDER — DIPHENHYDRAMINE HCL 25 MG PO CAPS
50.0000 mg | ORAL_CAPSULE | Freq: Once | ORAL | Status: AC
  Administered 2015-08-13: 50 mg via ORAL

## 2015-08-13 MED ORDER — ACETAMINOPHEN 325 MG PO TABS
650.0000 mg | ORAL_TABLET | Freq: Once | ORAL | Status: AC
  Administered 2015-08-13: 650 mg via ORAL

## 2015-08-13 MED ORDER — PREDNISONE 20 MG PO TABS: 40.0000 mg/m2 | ORAL_TABLET | Freq: Every day | ORAL | Status: DC

## 2015-08-13 MED ORDER — VINCRISTINE SULFATE CHEMO INJECTION 1 MG/ML
1.0000 mg | Freq: Once | INTRAVENOUS | Status: AC
  Administered 2015-08-13: 1 mg via INTRAVENOUS
  Filled 2015-08-13: qty 1

## 2015-08-13 MED ORDER — SODIUM CHLORIDE 0.9 % IV SOLN
375.0000 mg/m2 | Freq: Once | INTRAVENOUS | Status: AC
  Administered 2015-08-13: 580 mg via INTRAVENOUS
  Filled 2015-08-13: qty 29

## 2015-08-13 MED ORDER — SODIUM CHLORIDE 0.9 % IJ SOLN
10.0000 mL | INTRAMUSCULAR | Status: DC | PRN
Start: 2015-08-13 — End: 2015-08-13
  Administered 2015-08-13: 10 mL
  Filled 2015-08-13: qty 10

## 2015-08-13 MED ORDER — SODIUM CHLORIDE 0.9 % IV SOLN
375.0000 mg/m2 | Freq: Once | INTRAVENOUS | Status: AC
  Administered 2015-08-13: 600 mg via INTRAVENOUS
  Filled 2015-08-13: qty 60

## 2015-08-13 NOTE — Assessment & Plan Note (Signed)
This is likely due to recent treatment. The patient denies recent history of bleeding such as epistaxis, hematuria or hematochezia. She is asymptomatic from the anemia. I will observe for now.  She does not require transfusion now. I will continue the chemotherapy at current dose without dosage adjustment.  If the anemia gets progressive worse in the future, I might have to delay her treatment or adjust the chemotherapy dose. I will proceed to give her 1 unit of blood whenever hemoglobin is 8g or less.

## 2015-08-13 NOTE — Assessment & Plan Note (Signed)
Her arm swelling has improved since radiation treatment. She has not taking any Xarelto consistently. I reinforced the importance of taking Xarelto. So far, she denies complication of bleeding.

## 2015-08-13 NOTE — Assessment & Plan Note (Signed)
She has significant protein calorie malnutrition and weight loss. I will consult dietitian to follow.

## 2015-08-13 NOTE — Patient Instructions (Signed)
[facility] Discharge Instructions for Patients Receiving Chemotherapy  Today you received the following chemotherapy agents: Adriamycin, Vincristine, Cytoxan and Rituxan.  To help prevent nausea and vomiting after your treatment, we encourage you to take your nausea medication: Zofran. Take one every 8 hours as needed.   If you develop nausea and vomiting that is not controlled by your nausea medication, call the clinic.   BELOW ARE SYMPTOMS THAT SHOULD BE REPORTED IMMEDIATELY:  *FEVER GREATER THAN 100.5 F  *CHILLS WITH OR WITHOUT FEVER  NAUSEA AND VOMITING THAT IS NOT CONTROLLED WITH YOUR NAUSEA MEDICATION  *UNUSUAL SHORTNESS OF BREATH  *UNUSUAL BRUISING OR BLEEDING  TENDERNESS IN MOUTH AND THROAT WITH OR WITHOUT PRESENCE OF ULCERS  *URINARY PROBLEMS  *BOWEL PROBLEMS  UNUSUAL RASH Items with * indicate a potential emergency and should be followed up as soon as possible.  Feel free to call the clinic should you have any questions or concerns. The clinic phone number is [phone].  Please show the [facility] at check-in to the Emergency Department and triage nurse.

## 2015-08-13 NOTE — Progress Notes (Signed)
[facility] OFFICE PROGRESS NOTE  Patient Care Team: [name], MD as PCP - General (Hematology and Oncology) [name], MD as Consulting Physician (Hematology and Oncology)  SUMMARY OF ONCOLOGIC HISTORY:   Cancer of left breast ([place])   08/03/2008 Initial Diagnosis Breast CA   11/06/2014 Imaging Repeat CT scan of the chest, abdomen and pelvis show regression in the size of liver metastasis and pulmonary metastasis with mild progression of lymphadenopathy   11/06/2014 Tumor Marker CA-27-29 is elevated at 275   01/18/2015 Imaging MRI head is negative for metastatic disease   01/18/2015 Imaging CT scan of the chest, abdomen and pelvis show significant disease progression throughout.   01/25/2015 - 05/15/2015 Chemotherapy She started Gemzar every other week and Ibrutinib. Treatment is discontinued due to progression   02/20/2015 Tumor Marker CA 27-29 at 246   03/30/2015 Imaging Repeat CT scan of the chest, abdomen and pelvis show positive response to treatment for lymphoma. Liver lesions are stable.   06/05/2015 - 06/18/2015 Radiation Therapy  she completed radiation treatment to the right supraclavicular region   07/23/2015 -  Chemotherapy She recieved cycle 1 of RCHOP for SLL    INTERVAL HISTORY: Please see below for problem oriented charting. She is seen prior to cycle 2 of treatment. Her mucositis are resolving. She started to lose her hair. She denies further arm swelling. The patient denies any recent signs or symptoms of bleeding such as spontaneous epistaxis, hematuria or hematochezia. She denies recent nausea or vomiting.  REVIEW OF SYSTEMS:   Constitutional: Denies fevers, chills or abnormal weight loss Eyes: Denies blurriness of vision Respiratory: Denies cough, dyspnea or wheezes Cardiovascular: Denies palpitation, chest discomfort or lower extremity swelling Gastrointestinal:  Denies nausea, heartburn or change in bowel habits Skin: Denies abnormal skin  rashes Lymphatics: Denies new lymphadenopathy or easy bruising Neurological:Denies numbness, tingling or new weaknesses Behavioral/Psych: Mood is stable, no new changes  All other systems were reviewed with the patient and are negative.  I have reviewed the past medical history, past surgical history, social history and family history with the patient and they are unchanged from previous note.  ALLERGIES:  is allergic to codeine; penicillins; sulfonamide derivatives; tape; and tramadol hcl.  MEDICATIONS:  Current Outpatient Prescriptions  Medication Sig Dispense Refill  . HYDROcodone-acetaminophen (NORCO) 5-325 MG per tablet Take 1 tablet by mouth every 6 (six) hours as needed for moderate pain. 60 tablet 0  . ondansetron (ZOFRAN) 8 MG tablet Take 1 tablet (8 mg total) by mouth every 8 (eight) hours as needed. . 30 tablet 1  . rivaroxaban (XARELTO) 20 MG TABS tablet Take 1 tablet (20 mg total) by mouth daily with supper. 30 tablet 6  . predniSONE (DELTASONE) 20 MG tablet Take 3 tablets (60 mg total) by mouth daily. Take on days 2-5 of chemotherapy. 12 tablet 6   No current facility-administered medications for this visit.    PHYSICAL EXAMINATION: ECOG PERFORMANCE STATUS: 1 - Symptomatic but completely ambulatory  Filed Vitals:   08/13/15 1141  BP: 121/54  Pulse: 96  Temp: 97.9 F (36.6 C)  Resp: 18   Filed Weights   08/13/15 1141  Weight: 110 lb 9.6 oz (50.168 kg)    GENERAL:alert, no distress and comfortable SKIN: skin color, texture, turgor are normal, no rashes or significant lesions EYES: normal, Conjunctiva are pale and non-injected, sclera clear OROPHARYNX:no exudate, no erythema and lips, buccal mucosa, and tongue normal  NECK: supple, thyroid normal size, non-tender, without nodularity LYMPH:  no palpable lymphadenopathy in the cervical, axillary or inguinal LUNGS: clear to auscultation and percussion with normal breathing effort HEART: regular rate & rhythm and no  murmurs and no lower extremity edema ABDOMEN:abdomen soft, non-tender and normal bowel sounds Musculoskeletal:no cyanosis of digits and no clubbing  NEURO: alert & oriented x 3 with fluent speech, no focal motor/sensory deficits  LABORATORY DATA:  I have reviewed the data as listed    Component Value Date/Time   NA 142 08/06/2015 0951   NA 139 01/13/2015 1730   NA 142 06/01/2012 1008   K 3.3* 08/06/2015 0951   K 4.0 01/13/2015 1730   K 3.8 06/01/2012 1008   CL 103 01/13/2015 1730   CL 107 04/22/2013 0922   CL 103 06/01/2012 1008   CO2 27 08/06/2015 0951   CO2 25 06/13/2014 0819   CO2 25 06/01/2012 1008   GLUCOSE 129 08/06/2015 0951   GLUCOSE 93 01/13/2015 1730   GLUCOSE 89 04/22/2013 0922   GLUCOSE 108 06/01/2012 1008   BUN 4.7* 08/06/2015 0951   BUN 12 01/13/2015 1730   BUN 9 06/01/2012 1008   CREATININE 0.8 08/06/2015 0951   CREATININE 0.80 01/13/2015 1730   CREATININE 0.9 06/01/2012 1008   CALCIUM 8.9 08/06/2015 0951   CALCIUM 10.8* 06/13/2014 0819   CALCIUM 9.8 06/01/2012 1008   PROT 5.8* 08/06/2015 0951   PROT 7.5 06/13/2014 0819   PROT 8.1 08/14/2010 1251   ALBUMIN 2.8* 08/06/2015 0951   ALBUMIN 3.9 06/13/2014 0819   ALBUMIN 3.4 08/14/2010 1251   AST 14 08/06/2015 0951   AST 23 06/13/2014 0819   AST 19 08/14/2010 1251   ALT 20 08/06/2015 0951   ALT 13 06/13/2014 0819   ALT 9* 08/14/2010 1251   ALKPHOS 107 08/06/2015 0951   ALKPHOS 89 06/13/2014 0819   ALKPHOS 78 08/14/2010 1251   BILITOT <0.30 08/06/2015 0951   BILITOT 0.2* 06/13/2014 0819   BILITOT 0.40 08/14/2010 1251   GFRNONAA 87* 07/06/2012 1122   GFRAA >90 07/06/2012 1122    No results found for: SPEP, UPEP  Lab Results  Component Value Date   WBC 7.6 08/13/2015   NEUTROABS 6.0 08/13/2015   HGB 9.0* 08/13/2015   HCT 28.4* 08/13/2015   MCV 77.4* 08/13/2015   PLT 365 08/13/2015      Chemistry      Component Value Date/Time   NA 142 08/06/2015 0951   NA 139 01/13/2015 1730   NA 142  06/01/2012 1008   K 3.3* 08/06/2015 0951   K 4.0 01/13/2015 1730   K 3.8 06/01/2012 1008   CL 103 01/13/2015 1730   CL 107 04/22/2013 0922   CL 103 06/01/2012 1008   CO2 27 08/06/2015 0951   CO2 25 06/13/2014 0819   CO2 25 06/01/2012 1008   BUN 4.7* 08/06/2015 0951   BUN 12 01/13/2015 1730   BUN 9 06/01/2012 1008   CREATININE 0.8 08/06/2015 0951   CREATININE 0.80 01/13/2015 1730   CREATININE 0.9 06/01/2012 1008      Component Value Date/Time   CALCIUM 8.9 08/06/2015 0951   CALCIUM 10.8* 06/13/2014 0819   CALCIUM 9.8 06/01/2012 1008   ALKPHOS 107 08/06/2015 0951   ALKPHOS 89 06/13/2014 0819   ALKPHOS 78 08/14/2010 1251   AST 14 08/06/2015 0951   AST 23 06/13/2014 0819   AST 19 08/14/2010 1251   ALT 20 08/06/2015 0951   ALT 13 06/13/2014 0819   ALT 9* 08/14/2010 1251   BILITOT <  0.30 08/06/2015 0951   BILITOT 0.2* 06/13/2014 0819   BILITOT 0.40 08/14/2010 1251      ASSESSMENT & PLAN:   Lymphoma, small lymphocytic She tolerated the treatment well apart from just side effects with very mild mucositis and anemia. Continue supportive care. I plan to see her again next week prior to cycle 3 of treatment.   Anemia due to antineoplastic chemotherapy This is likely due to recent treatment. The patient denies recent history of bleeding such as epistaxis, hematuria or hematochezia. She is asymptomatic from the anemia. I will observe for now.  She does not require transfusion now. I will continue the chemotherapy at current dose without dosage adjustment.  If the anemia gets progressive worse in the future, I might have to delay her treatment or adjust the chemotherapy dose. I will proceed to give her 1 unit of blood whenever hemoglobin is 8g or less.    Acute thrombosis of right internal jugular vein Her arm swelling has improved since radiation treatment. She has not taking any Xarelto consistently. I reinforced the importance of taking Xarelto. So far, she denies  complication of bleeding.   Mucositis due to chemotherapy This is only mild, grade 1. I do not see any blisters or open sores. I recommend conservative management only.    Protein calorie malnutrition ([place]) She has significant protein calorie malnutrition and weight loss. I will consult dietitian to follow.    No orders of the defined types were placed in this encounter.   All questions were answered. The patient knows to call the clinic with any problems, questions or concerns. No barriers to learning was detected. I spent 25 minutes counseling the patient face to face. The total time spent in the appointment was 40 minutes and more than 50% was on counseling and review of test results     [facility], [place], MD 08/13/2015 12:03 PM

## 2015-08-13 NOTE — Assessment & Plan Note (Signed)
This is only mild, grade 1. I do not see any blisters or open sores. I recommend conservative management only.

## 2015-08-13 NOTE — Progress Notes (Signed)
80 year old female diagnosed with lymphoma and breast cancer with bone metastases.  Past medical history includes arthritis, hypertension, leukemia and right DVT.  Medications include Zofran, Xarelto and prednisone.  Labs include albumin 2.7.   Height: 62 inches. Weight: 110.6 pounds Usual body weight: 140 pounds January 2016; 135 pounds May 2016; and 128 pounds August 2016. BMI: 20.22.  Patient reports she does have some swallowing problems.  She complains about her lip and chin being swollen. Patient denies nausea and vomiting. States she has some diarrhea and some constipation.  Nutrition diagnosis:  Unintended weight loss related to inadequate oral intake as evidenced by 21% weight loss in 10 months.  Patient meets criteria for severe malnutrition in the context of chronic illness secondary to greater than 20% weight loss in one year and severe depletion of muscle mass and body fat.  Intervention:  Educated patient to increase calories and protein and consume meals and snacks at least 6 times daily. Reviewed high-calorie, high-protein foods that will be easier to chew and swallow. Provided fact sheets. Encouraged patient to try juice-based oral nutrition supplement, which she enjoyed. Provided samples and purchasing information. Recommended patient consume boost breeze twice a day. Questions were answered.  Teach back method used.  Contact information was given.  Monitoring, evaluation, goals:  Patient will increase calories and protein, and consume boost breeze twice a day to minimize further weight loss.  Next visit:  Monday, October 31 during infusion.    **Disclaimer: This note was dictated with voice recognition software. Similar sounding words can inadvertently be transcribed and this note may contain transcription errors which may not have been corrected upon publication of note.**

## 2015-08-13 NOTE — Assessment & Plan Note (Signed)
She tolerated the treatment well apart from just side effects with very mild mucositis and anemia. Continue supportive care. I plan to see her again next week prior to cycle 3 of treatment.

## 2015-08-13 NOTE — Telephone Encounter (Signed)
Gave and printed appt sched and avs fo rpt for OCT °

## 2015-08-15 ENCOUNTER — Ambulatory Visit (HOSPITAL_BASED_OUTPATIENT_CLINIC_OR_DEPARTMENT_OTHER): Payer: Medicare Other

## 2015-08-15 VITALS — BP 134/75 | HR 93 | Temp 97.6°F | Resp 18

## 2015-08-15 DIAGNOSIS — C8300 Small cell B-cell lymphoma, unspecified site: Secondary | ICD-10-CM | POA: Diagnosis not present

## 2015-08-15 DIAGNOSIS — C50912 Malignant neoplasm of unspecified site of left female breast: Secondary | ICD-10-CM

## 2015-08-15 DIAGNOSIS — C7951 Secondary malignant neoplasm of bone: Secondary | ICD-10-CM

## 2015-08-15 MED ORDER — PEGFILGRASTIM INJECTION 6 MG/0.6ML ~~LOC~~
6.0000 mg | PREFILLED_SYRINGE | Freq: Once | SUBCUTANEOUS | Status: AC
  Administered 2015-08-15: 6 mg via SUBCUTANEOUS
  Filled 2015-08-15: qty 0.6

## 2015-09-03 ENCOUNTER — Ambulatory Visit: Payer: Medicare Other | Admitting: Nutrition

## 2015-09-03 ENCOUNTER — Telehealth: Payer: Self-pay | Admitting: *Deleted

## 2015-09-03 ENCOUNTER — Encounter: Payer: Self-pay | Admitting: Hematology and Oncology

## 2015-09-03 ENCOUNTER — Other Ambulatory Visit (HOSPITAL_BASED_OUTPATIENT_CLINIC_OR_DEPARTMENT_OTHER): Payer: Medicare Other

## 2015-09-03 ENCOUNTER — Ambulatory Visit (HOSPITAL_BASED_OUTPATIENT_CLINIC_OR_DEPARTMENT_OTHER): Payer: Medicare Other

## 2015-09-03 ENCOUNTER — Telehealth: Payer: Self-pay | Admitting: Hematology and Oncology

## 2015-09-03 ENCOUNTER — Ambulatory Visit (HOSPITAL_BASED_OUTPATIENT_CLINIC_OR_DEPARTMENT_OTHER): Payer: Medicare Other | Admitting: Hematology and Oncology

## 2015-09-03 ENCOUNTER — Ambulatory Visit: Payer: Medicare Other

## 2015-09-03 VITALS — BP 116/54 | HR 75 | Temp 98.1°F | Resp 18

## 2015-09-03 VITALS — BP 115/57 | HR 88 | Temp 98.1°F | Resp 20 | Ht 62.0 in | Wt 109.9 lb

## 2015-09-03 DIAGNOSIS — Z23 Encounter for immunization: Secondary | ICD-10-CM

## 2015-09-03 DIAGNOSIS — Z5112 Encounter for antineoplastic immunotherapy: Secondary | ICD-10-CM

## 2015-09-03 DIAGNOSIS — E46 Unspecified protein-calorie malnutrition: Secondary | ICD-10-CM | POA: Diagnosis not present

## 2015-09-03 DIAGNOSIS — T451X5A Adverse effect of antineoplastic and immunosuppressive drugs, initial encounter: Secondary | ICD-10-CM

## 2015-09-03 DIAGNOSIS — D61810 Antineoplastic chemotherapy induced pancytopenia: Secondary | ICD-10-CM

## 2015-09-03 DIAGNOSIS — Z5111 Encounter for antineoplastic chemotherapy: Secondary | ICD-10-CM

## 2015-09-03 DIAGNOSIS — C50912 Malignant neoplasm of unspecified site of left female breast: Secondary | ICD-10-CM

## 2015-09-03 DIAGNOSIS — C8300 Small cell B-cell lymphoma, unspecified site: Secondary | ICD-10-CM

## 2015-09-03 DIAGNOSIS — I82C11 Acute embolism and thrombosis of right internal jugular vein: Secondary | ICD-10-CM | POA: Diagnosis not present

## 2015-09-03 DIAGNOSIS — Z95828 Presence of other vascular implants and grafts: Secondary | ICD-10-CM

## 2015-09-03 DIAGNOSIS — C7951 Secondary malignant neoplasm of bone: Secondary | ICD-10-CM

## 2015-09-03 LAB — COMPREHENSIVE METABOLIC PANEL (CC13)
ALBUMIN: 2.8 g/dL — AB (ref 3.5–5.0)
ALK PHOS: 82 U/L (ref 40–150)
ALT: 19 U/L (ref 0–55)
AST: 22 U/L (ref 5–34)
Anion Gap: 8 mEq/L (ref 3–11)
BILIRUBIN TOTAL: 0.41 mg/dL (ref 0.20–1.20)
BUN: 5.7 mg/dL — AB (ref 7.0–26.0)
CALCIUM: 9.1 mg/dL (ref 8.4–10.4)
CO2: 27 mEq/L (ref 22–29)
Chloride: 104 mEq/L (ref 98–109)
Creatinine: 0.6 mg/dL (ref 0.6–1.1)
Glucose: 85 mg/dl (ref 70–140)
POTASSIUM: 3.2 meq/L — AB (ref 3.5–5.1)
Sodium: 139 mEq/L (ref 136–145)
TOTAL PROTEIN: 5.7 g/dL — AB (ref 6.4–8.3)

## 2015-09-03 LAB — CBC WITH DIFFERENTIAL/PLATELET
BASO%: 0.8 % (ref 0.0–2.0)
BASOS ABS: 0.1 10*3/uL (ref 0.0–0.1)
EOS ABS: 0.1 10*3/uL (ref 0.0–0.5)
EOS%: 0.8 % (ref 0.0–7.0)
HEMATOCRIT: 26.1 % — AB (ref 34.8–46.6)
HEMOGLOBIN: 8.2 g/dL — AB (ref 11.6–15.9)
LYMPH#: 0.3 10*3/uL — AB (ref 0.9–3.3)
LYMPH%: 4.5 % — ABNORMAL LOW (ref 14.0–49.7)
MCH: 24.8 pg — AB (ref 25.1–34.0)
MCHC: 31.4 g/dL — ABNORMAL LOW (ref 31.5–36.0)
MCV: 78.9 fL — AB (ref 79.5–101.0)
MONO#: 1 10*3/uL — ABNORMAL HIGH (ref 0.1–0.9)
MONO%: 16.6 % — ABNORMAL HIGH (ref 0.0–14.0)
NEUT#: 4.8 10*3/uL (ref 1.5–6.5)
NEUT%: 77.3 % — ABNORMAL HIGH (ref 38.4–76.8)
Platelets: 329 10*3/uL (ref 145–400)
RBC: 3.31 10*6/uL — ABNORMAL LOW (ref 3.70–5.45)
RDW: 19.6 % — AB (ref 11.2–14.5)
WBC: 6.3 10*3/uL (ref 3.9–10.3)

## 2015-09-03 MED ORDER — ACETAMINOPHEN 325 MG PO TABS
ORAL_TABLET | ORAL | Status: AC
  Filled 2015-09-03: qty 2

## 2015-09-03 MED ORDER — ACETAMINOPHEN 325 MG PO TABS
650.0000 mg | ORAL_TABLET | Freq: Once | ORAL | Status: AC
Start: 2015-09-03 — End: 2015-09-03
  Administered 2015-09-03: 650 mg via ORAL

## 2015-09-03 MED ORDER — SODIUM CHLORIDE 0.9 % IJ SOLN
10.0000 mL | INTRAMUSCULAR | Status: DC | PRN
  Filled 2015-09-03: qty 10

## 2015-09-03 MED ORDER — SODIUM CHLORIDE 0.9 % IV SOLN
375.0000 mg/m2 | Freq: Once | INTRAVENOUS | Status: AC
  Administered 2015-09-03: 580 mg via INTRAVENOUS
  Filled 2015-09-03: qty 29

## 2015-09-03 MED ORDER — SODIUM CHLORIDE 0.9 % IV SOLN
Freq: Once | INTRAVENOUS | Status: AC
  Administered 2015-09-03: 10:00:00 via INTRAVENOUS

## 2015-09-03 MED ORDER — HEPARIN SOD (PORK) LOCK FLUSH 100 UNIT/ML IV SOLN
500.0000 [IU] | Freq: Once | INTRAVENOUS | Status: AC | PRN
  Administered 2015-09-03: 500 [IU]
  Filled 2015-09-03: qty 5

## 2015-09-03 MED ORDER — DIPHENHYDRAMINE HCL 25 MG PO CAPS
ORAL_CAPSULE | ORAL | Status: AC
  Filled 2015-09-03: qty 2

## 2015-09-03 MED ORDER — DENOSUMAB 120 MG/1.7ML ~~LOC~~ SOLN
120.0000 mg | Freq: Once | SUBCUTANEOUS | Status: AC
  Administered 2015-09-03: 120 mg via SUBCUTANEOUS
  Filled 2015-09-03: qty 1.7

## 2015-09-03 MED ORDER — SODIUM CHLORIDE 0.9 % IJ SOLN
10.0000 mL | INTRAMUSCULAR | Status: DC | PRN
  Administered 2015-09-03: 10 mL
  Filled 2015-09-03: qty 10

## 2015-09-03 MED ORDER — DIPHENHYDRAMINE HCL 25 MG PO CAPS
50.0000 mg | ORAL_CAPSULE | Freq: Once | ORAL | Status: AC
  Administered 2015-09-03: 50 mg via ORAL

## 2015-09-03 MED ORDER — SODIUM CHLORIDE 0.9 % IV SOLN
375.0000 mg/m2 | Freq: Once | INTRAVENOUS | Status: AC
  Administered 2015-09-03: 600 mg via INTRAVENOUS
  Filled 2015-09-03: qty 60

## 2015-09-03 MED ORDER — VINCRISTINE SULFATE CHEMO INJECTION 1 MG/ML
1.0000 mg | Freq: Once | INTRAVENOUS | Status: AC
  Administered 2015-09-03: 1 mg via INTRAVENOUS
  Filled 2015-09-03: qty 1

## 2015-09-03 MED ORDER — INFLUENZA VAC SPLIT QUAD 0.5 ML IM SUSY
0.5000 mL | PREFILLED_SYRINGE | Freq: Once | INTRAMUSCULAR | Status: AC
  Administered 2015-09-03: 0.5 mL via INTRAMUSCULAR
  Filled 2015-09-03: qty 0.5

## 2015-09-03 MED ORDER — DOXORUBICIN HCL CHEMO IV INJECTION 2 MG/ML
25.0000 mg/m2 | Freq: Once | INTRAVENOUS | Status: AC
  Administered 2015-09-03: 38 mg via INTRAVENOUS
  Filled 2015-09-03: qty 19

## 2015-09-03 MED ORDER — DEXAMETHASONE SODIUM PHOSPHATE 100 MG/10ML IJ SOLN
Freq: Once | INTRAMUSCULAR | Status: AC
  Administered 2015-09-03: 11:00:00 via INTRAVENOUS
  Filled 2015-09-03: qty 8

## 2015-09-03 MED ORDER — HEPARIN SOD (PORK) LOCK FLUSH 100 UNIT/ML IV SOLN
500.0000 [IU] | Freq: Once | INTRAVENOUS | Status: AC
  Administered 2015-09-03: 500 [IU] via INTRAVENOUS
  Filled 2015-09-03: qty 5

## 2015-09-03 MED ORDER — SODIUM CHLORIDE 0.9 % IJ SOLN
10.0000 mL | INTRAMUSCULAR | Status: DC | PRN
  Administered 2015-09-03: 10 mL via INTRAVENOUS
  Filled 2015-09-03: qty 10

## 2015-09-03 NOTE — Telephone Encounter (Signed)
per por to sch pt appt-gave pt copy of avs

## 2015-09-03 NOTE — Assessment & Plan Note (Signed)
This is likely due to recent treatment. The patient denies recent history of bleeding such as epistaxis, hematuria or hematochezia. She is asymptomatic from the anemia. I will observe for now.  She does not require transfusion now. I will continue the chemotherapy at current dose without dosage adjustment.  If the anemia gets progressive worse in the future, I might have to delay her treatment or adjust the chemotherapy dose. I will proceed to give her 1 unit of blood whenever hemoglobin is 8g or less.

## 2015-09-03 NOTE — Progress Notes (Signed)
[facility] OFFICE PROGRESS NOTE  Patient Care Team: [name], MD as PCP - General (Hematology and Oncology) [name], MD as Consulting Physician (Hematology and Oncology)  SUMMARY OF ONCOLOGIC HISTORY:   Cancer of left breast ([place])   08/03/2008 Initial Diagnosis Breast CA   11/06/2014 Imaging Repeat CT scan of the chest, abdomen and pelvis show regression in the size of liver metastasis and pulmonary metastasis with mild progression of lymphadenopathy   11/06/2014 Tumor Marker [id number] is elevated at 275   01/18/2015 Imaging MRI head is negative for metastatic disease   01/18/2015 Imaging CT scan of the chest, abdomen and pelvis show significant disease progression throughout.   01/25/2015 - 05/15/2015 Chemotherapy She started Gemzar every other week and Ibrutinib. Treatment is discontinued due to progression   02/20/2015 Tumor Marker CA 27-29 at 246   03/30/2015 Imaging Repeat CT scan of the chest, abdomen and pelvis show positive response to treatment for lymphoma. Liver lesions are stable.   06/05/2015 - 06/18/2015 Radiation Therapy  she completed radiation treatment to the right supraclavicular region   07/23/2015 -  Chemotherapy She recieved cycle 1 of RCHOP for SLL    INTERVAL HISTORY: Please see below for problem oriented charting.  She returns for further follow-up. She complained of fatigue and poor appetite.  she continues to have bilateral lower extremity edema. She has very trace mucositis but no nausea or diarrhea. Her hair is falling out. She denies recent infection. The patient denies any recent signs or symptoms of bleeding such as spontaneous epistaxis, hematuria or hematochezia.   REVIEW OF SYSTEMS:   Constitutional: Denies fevers, chills  Eyes: Denies blurriness of vision Respiratory: Denies cough, dyspnea or wheezes Cardiovascular: Denies palpitation, chest discomfort  Skin: Denies abnormal skin rashes Lymphatics: Denies new lymphadenopathy or easy  bruising Neurological:Denies numbness, tingling or new weaknesses Behavioral/Psych: Mood is stable, no new changes  All other systems were reviewed with the patient and are negative.  I have reviewed the past medical history, past surgical history, social history and family history with the patient and they are unchanged from previous note.  ALLERGIES:  is allergic to codeine; penicillins; sulfonamide derivatives; tape; and tramadol hcl.  MEDICATIONS:  Current Outpatient Prescriptions  Medication Sig Dispense Refill  . HYDROcodone-acetaminophen (NORCO) 5-325 MG per tablet Take 1 tablet by mouth every 6 (six) hours as needed for moderate pain. 60 tablet 0  . ondansetron (ZOFRAN) 8 MG tablet Take 1 tablet (8 mg total) by mouth every 8 (eight) hours as needed. . 30 tablet 1  . predniSONE (DELTASONE) 20 MG tablet Take 3 tablets (60 mg total) by mouth daily. Take on days 2-5 of chemotherapy. 12 tablet 6  . rivaroxaban (XARELTO) 20 MG TABS tablet Take 1 tablet (20 mg total) by mouth daily with supper. 30 tablet 6   Current Facility-Administered Medications  Medication Dose Route Frequency Provider Last Rate Last Dose  . sodium chloride 0.9 % injection 10 mL  10 mL Intravenous PRN [name], MD   10 mL at 09/03/15 3614   Facility-Administered Medications Ordered in Other Visits  Medication Dose Route Frequency Provider Last Rate Last Dose  . sodium chloride 0.9 % injection 10 mL  10 mL Intravenous PRN [name], MD        PHYSICAL EXAMINATION: ECOG PERFORMANCE STATUS: 2 - Symptomatic, <50% confined to bed  Filed Vitals:   09/03/15 1006  BP: 115/57  Pulse: 88  Temp: 98.1 F (36.7 C)  Resp: 20  Filed Weights   09/03/15 1006  Weight: 109 lb 14.4 oz (49.85 kg)    GENERAL:alert, no distress and comfortable. She looks thin and cachectic SKIN: skin color, texture, turgor are normal, no rashes or significant lesions EYES: normal, Conjunctiva are pale and non-injected, sclera  clear OROPHARYNX:no exudate, no erythema and lips, buccal mucosa, and tongue normal  NECK: supple, thyroid normal size, non-tender, without nodularity LYMPH:  no palpable lymphadenopathy in the cervical, axillary or inguinal LUNGS: clear to auscultation and percussion with normal breathing effort HEART: regular rate & rhythm and no murmurs with mild bilateral lower extremity edema ABDOMEN:abdomen soft, non-tender and normal bowel sounds Musculoskeletal:no cyanosis of digits and no clubbing  NEURO: alert & oriented x 3 with fluent speech, no focal motor/sensory deficits  LABORATORY DATA:  I have reviewed the data as listed    Component Value Date/Time   NA 137 08/13/2015 1050   NA 139 01/13/2015 1730   NA 142 06/01/2012 1008   K 3.8 08/13/2015 1050   K 4.0 01/13/2015 1730   K 3.8 06/01/2012 1008   CL 103 01/13/2015 1730   CL 107 04/22/2013 0922   CL 103 06/01/2012 1008   CO2 24 08/13/2015 1050   CO2 25 06/13/2014 0819   CO2 25 06/01/2012 1008   GLUCOSE 112 08/13/2015 1050   GLUCOSE 93 01/13/2015 1730   GLUCOSE 89 04/22/2013 0922   GLUCOSE 108 06/01/2012 1008   BUN 4.8* 08/13/2015 1050   BUN 12 01/13/2015 1730   BUN 9 06/01/2012 1008   CREATININE 0.6 08/13/2015 1050   CREATININE 0.80 01/13/2015 1730   CREATININE 0.9 06/01/2012 1008   CALCIUM 8.5 08/13/2015 1050   CALCIUM 10.8* 06/13/2014 0819   CALCIUM 9.8 06/01/2012 1008   PROT 5.7* 08/13/2015 1050   PROT 7.5 06/13/2014 0819   PROT 8.1 08/14/2010 1251   ALBUMIN 2.7* 08/13/2015 1050   ALBUMIN 3.9 06/13/2014 0819   ALBUMIN 3.4 08/14/2010 1251   AST 22 08/13/2015 1050   AST 23 06/13/2014 0819   AST 19 08/14/2010 1251   ALT 20 08/13/2015 1050   ALT 13 06/13/2014 0819   ALT 9* 08/14/2010 1251   ALKPHOS 87 08/13/2015 1050   ALKPHOS 89 06/13/2014 0819   ALKPHOS 78 08/14/2010 1251   BILITOT 0.34 08/13/2015 1050   BILITOT 0.2* 06/13/2014 0819   BILITOT 0.40 08/14/2010 1251   GFRNONAA 87* 07/06/2012 1122   GFRAA >90  07/06/2012 1122    No results found for: SPEP, UPEP  Lab Results  Component Value Date   WBC 6.3 09/03/2015   NEUTROABS 4.8 09/03/2015   HGB 8.2* 09/03/2015   HCT 26.1* 09/03/2015   MCV 78.9* 09/03/2015   PLT 329 09/03/2015      Chemistry      Component Value Date/Time   NA 137 08/13/2015 1050   NA 139 01/13/2015 1730   NA 142 06/01/2012 1008   K 3.8 08/13/2015 1050   K 4.0 01/13/2015 1730   K 3.8 06/01/2012 1008   CL 103 01/13/2015 1730   CL 107 04/22/2013 0922   CL 103 06/01/2012 1008   CO2 24 08/13/2015 1050   CO2 25 06/13/2014 0819   CO2 25 06/01/2012 1008   BUN 4.8* 08/13/2015 1050   BUN 12 01/13/2015 1730   BUN 9 06/01/2012 1008   CREATININE 0.6 08/13/2015 1050   CREATININE 0.80 01/13/2015 1730   CREATININE 0.9 06/01/2012 1008      Component Value Date/Time   CALCIUM 8.5  08/13/2015 1050   CALCIUM 10.8* 06/13/2014 0819   CALCIUM 9.8 06/01/2012 1008   ALKPHOS 87 08/13/2015 1050   ALKPHOS 89 06/13/2014 0819   ALKPHOS 78 08/14/2010 1251   AST 22 08/13/2015 1050   AST 23 06/13/2014 0819   AST 19 08/14/2010 1251   ALT 20 08/13/2015 1050   ALT 13 06/13/2014 0819   ALT 9* 08/14/2010 1251   BILITOT 0.34 08/13/2015 1050   BILITOT 0.2* 06/13/2014 0819   BILITOT 0.40 08/14/2010 1251     ASSESSMENT & PLAN:  Lymphoma, small lymphocytic She tolerated treatment poorly which is not unexpected given her age and multiple different treatment. She complained of excessive fatigue and poor appetite. Shows a complaint of hair loss Surprisingly, her blood count has been fairly stable. I will proceed with further treatment today and restage her with another CT scan in 3 weeks. Depending on response to treatment, she may need to discuss with family members whether she went to continue to pursue chemotherapy or not.  Cancer of left breast She has concurrent breast cancer and the treatment that I prescribed for her lymphoma should treat that. Tumor Marker has been  stable. As above, I will pursue CT scan to restage her prior to next visit.  Acute thrombosis of right internal jugular vein Her arm swelling has improved since radiation treatment. She has not taking any Xarelto consistently. I reinforced the importance of taking Xarelto. So far, she denies complication of bleeding.    Protein calorie malnutrition ([place]) She has significant protein calorie malnutrition and weight loss. I will consult dietitian to follow.    Pancytopenia due to antineoplastic chemotherapy [facility]) This is likely due to recent treatment. The patient denies recent history of bleeding such as epistaxis, hematuria or hematochezia. She is asymptomatic from the anemia. I will observe for now.  She does not require transfusion now. I will continue the chemotherapy at current dose without dosage adjustment.  If the anemia gets progressive worse in the future, I might have to delay her treatment or adjust the chemotherapy dose. I will proceed to give her 1 unit of blood whenever hemoglobin is 8g or less.       Orders Placed This Encounter  Procedures  . CT Chest W Contrast    Standing Status: Future     Number of Occurrences:      Standing Expiration Date: 11/02/2016    Order Specific Question:  Reason for Exam (SYMPTOM  OR DIAGNOSIS REQUIRED)    Answer:  staging lymphoma and breast ca, assess response to chemo    Order Specific Question:  Preferred imaging location?    Answer:  [facility]  . CT Abdomen Pelvis W Contrast    Standing Status: Future     Number of Occurrences:      Standing Expiration Date: 12/03/2016    Order Specific Question:  Reason for Exam (SYMPTOM  OR DIAGNOSIS REQUIRED)    Answer:  staging lymphoma and breast ca, assess response to chemo    Order Specific Question:  Preferred imaging location?    Answer:  [facility]   All questions were answered. The patient knows to call the clinic with any problems, questions or concerns. No  barriers to learning was detected. I spent 25 minutes counseling the patient face to face. The total time spent in the appointment was 40 minutes and more than 50% was on counseling and review of test results     [facility], [place], MD 09/03/2015  10:17 AM

## 2015-09-03 NOTE — Progress Notes (Signed)
Nutrition follow-up completed with patient diagnosed with lymphoma and breast cancer with bone metastases, during chemotherapy. Weight down slightly and documented as 109.9 pounds October 31.  Decreased from 110.6 pounds. Patient reports difficulty chewing and swallowing secondary to her swollen lip and chin. Patient reports constipation. She would like to drink some juice-based oral nutrition supplements however has not been able to purchase them.  Patient reports she lost purchasing information that I provided to her last visit.  Nutrition diagnosis: Unintended weight loss continues.  Intervention: Encouraged patient to purchase oral nutrition supplements to provide additional calories and protein. Provided additional samples of resource breeze and ensure clear. Educated patient on Engineer, maintenance (IT). Educated patient on strategies for improving constipation and encouraged increased hydration. Questions were answered.  Teach back method used.  Contact information provided.  Monitoring, evaluation, goals: Patient will tolerate increased calories and protein to minimize further weight loss.  Next visit: Patient will contact me with questions.  **Disclaimer: This note was dictated with voice recognition software. Similar sounding words can inadvertently be transcribed and this note may contain transcription errors which may not have been corrected upon publication of note.**

## 2015-09-03 NOTE — Patient Instructions (Signed)
[place] Discharge Instructions for Patients Receiving Chemotherapy  Today you received the following chemotherapy agents adriamyacin, vincristine, cytoxan and rituxan   If you develop nausea and vomiting that is not controlled by your nausea medication, call the clinic.   BELOW ARE SYMPTOMS THAT SHOULD BE REPORTED IMMEDIATELY:  *FEVER GREATER THAN 100.5 F  *CHILLS WITH OR WITHOUT FEVER  NAUSEA AND VOMITING THAT IS NOT CONTROLLED WITH YOUR NAUSEA MEDICATION  *UNUSUAL SHORTNESS OF BREATH  *UNUSUAL BRUISING OR BLEEDING  TENDERNESS IN MOUTH AND THROAT WITH OR WITHOUT PRESENCE OF ULCERS  *URINARY PROBLEMS  *BOWEL PROBLEMS  UNUSUAL RASH Items with * indicate a potential emergency and should be followed up as soon as possible.  Feel free to call the clinic you have any questions or concerns. The clinic phone number is [phone].  Please show the [place] at check-in to the Emergency Department and triage nurse.

## 2015-09-03 NOTE — Assessment & Plan Note (Signed)
She tolerated treatment poorly which is not unexpected given her age and multiple different treatment. She complained of excessive fatigue and poor appetite. Shows a complaint of hair loss Surprisingly, her blood count has been fairly stable. I will proceed with further treatment today and restage her with another CT scan in 3 weeks. Depending on response to treatment, she may need to discuss with family members whether she went to continue to pursue chemotherapy or not.

## 2015-09-03 NOTE — Assessment & Plan Note (Signed)
Her arm swelling has improved since radiation treatment. She has not taking any Xarelto consistently. I reinforced the importance of taking Xarelto. So far, she denies complication of bleeding.

## 2015-09-03 NOTE — Progress Notes (Signed)
Done on exam side

## 2015-09-03 NOTE — Assessment & Plan Note (Signed)
She has concurrent breast cancer and the treatment that I prescribed for her lymphoma should treat that. Tumor Marker has been stable. As above, I will pursue CT scan to restage her prior to next visit.

## 2015-09-03 NOTE — Telephone Encounter (Signed)
Labs to NG

## 2015-09-03 NOTE — Assessment & Plan Note (Signed)
She has significant protein calorie malnutrition and weight loss. I will consult dietitian to follow.

## 2015-09-04 LAB — CANCER ANTIGEN 27.29: CA 27.29: 127 U/mL — AB (ref 0–39)

## 2015-09-05 ENCOUNTER — Ambulatory Visit (HOSPITAL_BASED_OUTPATIENT_CLINIC_OR_DEPARTMENT_OTHER): Payer: Medicare Other

## 2015-09-05 ENCOUNTER — Telehealth: Payer: Self-pay | Admitting: *Deleted

## 2015-09-05 VITALS — BP 123/63 | HR 100 | Temp 97.7°F | Resp 22

## 2015-09-05 DIAGNOSIS — C7951 Secondary malignant neoplasm of bone: Secondary | ICD-10-CM

## 2015-09-05 DIAGNOSIS — C50912 Malignant neoplasm of unspecified site of left female breast: Secondary | ICD-10-CM

## 2015-09-05 DIAGNOSIS — C8300 Small cell B-cell lymphoma, unspecified site: Secondary | ICD-10-CM | POA: Diagnosis not present

## 2015-09-05 MED ORDER — PEGFILGRASTIM INJECTION 6 MG/0.6ML ~~LOC~~
6.0000 mg | PREFILLED_SYRINGE | Freq: Once | SUBCUTANEOUS | Status: AC
  Administered 2015-09-05: 6 mg via SUBCUTANEOUS
  Filled 2015-09-05: qty 0.6

## 2015-09-05 NOTE — Telephone Encounter (Signed)
Per staff message and POF I have scheduled appts. Advised scheduler of appts. JMW  

## 2015-09-05 NOTE — Patient Instructions (Signed)
Pegfilgrastim injection What is this medicine? PEGFILGRASTIM (PEG fil gra stim) is a long-acting granulocyte colony-stimulating factor that stimulates the growth of neutrophils, a type of white blood cell important in the body's fight against infection. It is used to reduce the incidence of fever and infection in patients with certain types of cancer who are receiving chemotherapy that affects the bone marrow, and to increase survival after being exposed to high doses of radiation. This medicine may be used for other purposes; ask your health care provider or pharmacist if you have questions. What should I tell my health care provider before I take this medicine? They need to know if you have any of these conditions: -kidney disease -latex allergy -ongoing radiation therapy -sickle cell disease -skin reactions to acrylic adhesives (On-Body Injector only) -an unusual or allergic reaction to pegfilgrastim, filgrastim, other medicines, foods, dyes, or preservatives -pregnant or trying to get pregnant -breast-feeding How should I use this medicine? This medicine is for injection under the skin. If you get this medicine at home, you will be taught how to prepare and give the pre-filled syringe or how to use the On-body Injector. Refer to the patient Instructions for Use for detailed instructions. Use exactly as directed. Take your medicine at regular intervals. Do not take your medicine more often than directed. It is important that you put your used needles and syringes in a special sharps container. Do not put them in a trash can. If you do not have a sharps container, call your pharmacist or healthcare provider to get one. Talk to your pediatrician regarding the use of this medicine in children. While this drug may be prescribed for selected conditions, precautions do apply. Overdosage: If you think you have taken too much of this medicine contact a poison control center or emergency room at  once. NOTE: This medicine is only for you. Do not share this medicine with others. What if I miss a dose? It is important not to miss your dose. Call your doctor or health care professional if you miss your dose. If you miss a dose due to an On-body Injector failure or leakage, a new dose should be administered as soon as possible using a single prefilled syringe for manual use. What may interact with this medicine? Interactions have not been studied. Give your health care provider a list of all the medicines, herbs, non-prescription drugs, or dietary supplements you use. Also tell them if you smoke, drink alcohol, or use illegal drugs. Some items may interact with your medicine. This list may not describe all possible interactions. Give your health care provider a list of all the medicines, herbs, non-prescription drugs, or dietary supplements you use. Also tell them if you smoke, drink alcohol, or use illegal drugs. Some items may interact with your medicine. What should I watch for while using this medicine? You may need blood work done while you are taking this medicine. If you are going to need a MRI, CT scan, or other procedure, tell your doctor that you are using this medicine (On-Body Injector only). What side effects may I notice from receiving this medicine? Side effects that you should report to your doctor or health care professional as soon as possible: -allergic reactions like skin rash, itching or hives, swelling of the face, lips, or tongue -dizziness -fever -pain, redness, or irritation at site where injected -pinpoint red spots on the skin -red or dark-brown urine -shortness of breath or breathing problems -stomach or side pain, or pain   at the shoulder -swelling -tiredness -trouble passing urine or change in the amount of urine Side effects that usually do not require medical attention (report to your doctor or health care professional if they continue or are  bothersome): -bone pain -muscle pain This list may not describe all possible side effects. Call your doctor for medical advice about side effects. You may report side effects to FDA at 1-800-FDA-1088. Where should I keep my medicine? Keep out of the reach of children. Store pre-filled syringes in a refrigerator between 2 and 8 degrees C (36 and 46 degrees F). Do not freeze. Keep in carton to protect from light. Throw away this medicine if it is left out of the refrigerator for more than 48 hours. Throw away any unused medicine after the expiration date. NOTE: This sheet is a summary. It may not cover all possible information. If you have questions about this medicine, talk to your doctor, pharmacist, or health care provider.    2016, Elsevier/Gold Standard. (2014-11-09 14:30:14)  

## 2015-09-19 ENCOUNTER — Telehealth: Payer: Self-pay | Admitting: *Deleted

## 2015-09-19 NOTE — Telephone Encounter (Signed)
Pt states she rinsed mouth this morning and she had a lot of blood in her mouth- gums around teeth. Did not brush teeth. No bleeding at present. Denies any other bleeding. Instructed to stop xarelto until she sees Dr Alvy Bimler on Monday. To call us if bleeding worsens.

## 2015-09-21 ENCOUNTER — Other Ambulatory Visit: Payer: Self-pay | Admitting: Hematology and Oncology

## 2015-09-21 ENCOUNTER — Ambulatory Visit (HOSPITAL_COMMUNITY)
Admission: RE | Admit: 2015-09-21 | Discharge: 2015-09-21 | Disposition: A | Discharge status: Home / Self Care | Payer: Medicare Other | Source: Ambulatory Visit | Attending: Hematology and Oncology | Admitting: Hematology and Oncology

## 2015-09-21 ENCOUNTER — Telehealth: Payer: Self-pay | Admitting: Hematology and Oncology

## 2015-09-21 ENCOUNTER — Ambulatory Visit: Payer: Medicare Other

## 2015-09-21 ENCOUNTER — Other Ambulatory Visit (HOSPITAL_BASED_OUTPATIENT_CLINIC_OR_DEPARTMENT_OTHER): Payer: Medicare Other

## 2015-09-21 ENCOUNTER — Ambulatory Visit (HOSPITAL_BASED_OUTPATIENT_CLINIC_OR_DEPARTMENT_OTHER): Payer: Medicare Other

## 2015-09-21 VITALS — BP 115/48 | HR 85 | Temp 98.0°F | Resp 20

## 2015-09-21 DIAGNOSIS — C50919 Malignant neoplasm of unspecified site of unspecified female breast: Secondary | ICD-10-CM | POA: Diagnosis not present

## 2015-09-21 DIAGNOSIS — K8020 Calculus of gallbladder without cholecystitis without obstruction: Secondary | ICD-10-CM | POA: Diagnosis not present

## 2015-09-21 DIAGNOSIS — C787 Secondary malignant neoplasm of liver and intrahepatic bile duct: Secondary | ICD-10-CM | POA: Diagnosis not present

## 2015-09-21 DIAGNOSIS — D259 Leiomyoma of uterus, unspecified: Secondary | ICD-10-CM | POA: Diagnosis not present

## 2015-09-21 DIAGNOSIS — C7951 Secondary malignant neoplasm of bone: Secondary | ICD-10-CM

## 2015-09-21 DIAGNOSIS — D630 Anemia in neoplastic disease: Secondary | ICD-10-CM

## 2015-09-21 DIAGNOSIS — C8300 Small cell B-cell lymphoma, unspecified site: Secondary | ICD-10-CM

## 2015-09-21 DIAGNOSIS — D7389 Other diseases of spleen: Secondary | ICD-10-CM | POA: Diagnosis not present

## 2015-09-21 DIAGNOSIS — D6481 Anemia due to antineoplastic chemotherapy: Secondary | ICD-10-CM | POA: Diagnosis present

## 2015-09-21 DIAGNOSIS — N281 Cyst of kidney, acquired: Secondary | ICD-10-CM | POA: Diagnosis not present

## 2015-09-21 DIAGNOSIS — R590 Localized enlarged lymph nodes: Secondary | ICD-10-CM | POA: Diagnosis not present

## 2015-09-21 DIAGNOSIS — C50912 Malignant neoplasm of unspecified site of left female breast: Secondary | ICD-10-CM

## 2015-09-21 DIAGNOSIS — M899 Disorder of bone, unspecified: Secondary | ICD-10-CM | POA: Diagnosis not present

## 2015-09-21 DIAGNOSIS — Z95828 Presence of other vascular implants and grafts: Secondary | ICD-10-CM

## 2015-09-21 LAB — CBC WITH DIFFERENTIAL/PLATELET
BASO%: 0.4 % (ref 0.0–2.0)
Basophils Absolute: 0 10*3/uL (ref 0.0–0.1)
EOS%: 0.3 % (ref 0.0–7.0)
Eosinophils Absolute: 0 10*3/uL (ref 0.0–0.5)
HEMATOCRIT: 24.8 % — AB (ref 34.8–46.6)
HGB: 7.9 g/dL — ABNORMAL LOW (ref 11.6–15.9)
LYMPH#: 0.1 10*3/uL — AB (ref 0.9–3.3)
LYMPH%: 0.8 % — ABNORMAL LOW (ref 14.0–49.7)
MCH: 25 pg — ABNORMAL LOW (ref 25.1–34.0)
MCHC: 31.8 g/dL (ref 31.5–36.0)
MCV: 78.6 fL — ABNORMAL LOW (ref 79.5–101.0)
MONO#: 1.2 10*3/uL — ABNORMAL HIGH (ref 0.1–0.9)
MONO%: 10.8 % (ref 0.0–14.0)
NEUT#: 9.5 10*3/uL — ABNORMAL HIGH (ref 1.5–6.5)
NEUT%: 87.7 % — AB (ref 38.4–76.8)
Platelets: 347 10*3/uL (ref 145–400)
RBC: 3.15 10*6/uL — ABNORMAL LOW (ref 3.70–5.45)
RDW: 21.8 % — AB (ref 11.2–14.5)
WBC: 10.9 10*3/uL — ABNORMAL HIGH (ref 3.9–10.3)

## 2015-09-21 LAB — COMPREHENSIVE METABOLIC PANEL (CC13)
ALT: 11 U/L (ref 0–55)
AST: 20 U/L (ref 5–34)
Albumin: 2.7 g/dL — ABNORMAL LOW (ref 3.5–5.0)
Alkaline Phosphatase: 87 U/L (ref 40–150)
Anion Gap: 8 mEq/L (ref 3–11)
BUN: 4.3 mg/dL — AB (ref 7.0–26.0)
CALCIUM: 9.2 mg/dL (ref 8.4–10.4)
CHLORIDE: 100 meq/L (ref 98–109)
CO2: 29 meq/L (ref 22–29)
CREATININE: 0.6 mg/dL (ref 0.6–1.1)
EGFR: >90 mL/min/{1.73_m2} (ref >90)
GLUCOSE: 96 mg/dL (ref 70–140)
Potassium: 3.2 mEq/L — ABNORMAL LOW (ref 3.5–5.1)
SODIUM: 137 meq/L (ref 136–145)
Total Bilirubin: 0.42 mg/dL (ref 0.20–1.20)
Total Protein: 5.7 g/dL — ABNORMAL LOW (ref 6.4–8.3)

## 2015-09-21 LAB — PREPARE RBC (CROSSMATCH)

## 2015-09-21 MED ORDER — SODIUM CHLORIDE 0.9 % IJ SOLN
10.0000 mL | INTRAMUSCULAR | Status: DC | PRN
  Administered 2015-09-21: 10 mL via INTRAVENOUS
  Filled 2015-09-21: qty 10

## 2015-09-21 MED ORDER — HEPARIN SOD (PORK) LOCK FLUSH 100 UNIT/ML IV SOLN
250.0000 [IU] | INTRAVENOUS | Status: DC | PRN
  Filled 2015-09-21: qty 5

## 2015-09-21 MED ORDER — HEPARIN SOD (PORK) LOCK FLUSH 100 UNIT/ML IV SOLN
500.0000 [IU] | Freq: Every day | INTRAVENOUS | Status: AC | PRN
  Administered 2015-09-21: 500 [IU]
  Filled 2015-09-21: qty 5

## 2015-09-21 MED ORDER — SODIUM CHLORIDE 0.9 % IJ SOLN
3.0000 mL | INTRAMUSCULAR | Status: DC | PRN
  Filled 2015-09-21: qty 10

## 2015-09-21 MED ORDER — IOHEXOL 300 MG/ML  SOLN
100.0000 mL | Freq: Once | INTRAMUSCULAR | Status: AC | PRN
  Administered 2015-09-21: 100 mL via INTRAVENOUS

## 2015-09-21 MED ORDER — DIPHENHYDRAMINE HCL 25 MG PO CAPS
ORAL_CAPSULE | ORAL | Status: AC
  Filled 2015-09-21: qty 1

## 2015-09-21 MED ORDER — ACETAMINOPHEN 325 MG PO TABS
ORAL_TABLET | ORAL | Status: AC
  Filled 2015-09-21: qty 2

## 2015-09-21 MED ORDER — SODIUM CHLORIDE 0.9 % IV SOLN
250.0000 mL | Freq: Once | INTRAVENOUS | Status: AC
  Administered 2015-09-21: 250 mL via INTRAVENOUS

## 2015-09-21 MED ORDER — DIPHENHYDRAMINE HCL 25 MG PO CAPS
25.0000 mg | ORAL_CAPSULE | Freq: Once | ORAL | Status: AC
  Administered 2015-09-21: 25 mg via ORAL

## 2015-09-21 MED ORDER — SODIUM CHLORIDE 0.9 % IJ SOLN
10.0000 mL | INTRAMUSCULAR | Status: AC | PRN
  Administered 2015-09-21: 10 mL
  Filled 2015-09-21: qty 10

## 2015-09-21 MED ORDER — ACETAMINOPHEN 325 MG PO TABS
650.0000 mg | ORAL_TABLET | Freq: Once | ORAL | Status: AC
  Administered 2015-09-21: 650 mg via ORAL

## 2015-09-21 NOTE — Telephone Encounter (Signed)
I have spoken with the patient's son Evette Doffing. It appears that her imaging study today showed evidence of mixed response. It is likely that the patient will receive a different kind of treatment next week. I recommend the son to attend her appointment next week to discuss treatment options

## 2015-09-21 NOTE — Patient Instructions (Signed)

## 2015-09-21 NOTE — Progress Notes (Signed)
Called [employer] transportation at 13:08 to come pick up patient.  She should be ready in about 15 minutes.

## 2015-09-21 NOTE — Patient Instructions (Signed)
Patient to have port needle deaccessed after CT scan today 09/21/15 before leaving [facility].  Implanted [facility] Guide An implanted port is a type of central line that is placed under the skin. Central lines are used to provide IV access when treatment or nutrition needs to be given through a person's veins. Implanted ports are used for long-term IV access. An implanted port may be placed because:   You need IV medicine that would be irritating to the small veins in your hands or arms.   You need long-term IV medicines, such as antibiotics.   You need IV nutrition for a long period.   You need frequent blood draws for lab tests.   You need dialysis.  Implanted ports are usually placed in the chest area, but they can also be placed in the upper arm, the abdomen, or the leg. An implanted port has two main parts:   Reservoir. The reservoir is round and will appear as a small, raised area under your skin. The reservoir is the part where a needle is inserted to give medicines or draw blood.   Catheter. The catheter is a thin, flexible tube that extends from the reservoir. The catheter is placed into a large vein. Medicine that is inserted into the reservoir goes into the catheter and then into the vein.  HOW WILL I CARE FOR MY INCISION SITE? Do not get the incision site wet. Bathe or shower as directed by your health care provider.  HOW IS MY PORT ACCESSED? Special steps must be taken to access the port:   Before the port is accessed, a numbing cream can be placed on the skin. This helps numb the skin over the port site.   Your health care provider uses a sterile technique to access the port.  Your health care provider must put on a mask and sterile gloves.  The skin over your port is cleaned carefully with an antiseptic and allowed to dry.  The port is gently pinched between sterile gloves, and a needle is inserted into the port.  Only "non-coring" port needles  should be used to access the port. Once the port is accessed, a blood return should be checked. This helps ensure that the port is in the vein and is not clogged.   If your port needs to remain accessed for a constant infusion, a clear (transparent) bandage will be placed over the needle site. The bandage and needle will need to be changed every week, or as directed by your health care provider.   Keep the bandage covering the needle clean and dry. Do not get it wet. Follow your health care provider's instructions on how to take a shower or bath while the port is accessed.   If your port does not need to stay accessed, no bandage is needed over the port.  WHAT IS FLUSHING? Flushing helps keep the port from getting clogged. Follow your health care provider's instructions on how and when to flush the port. Ports are usually flushed with saline solution or a medicine called heparin. The need for flushing will depend on how the port is used.   If the port is used for intermittent medicines or blood draws, the port will need to be flushed:   After medicines have been given.   After blood has been drawn.   As part of routine maintenance.   If a constant infusion is running, the port may not need to be flushed.  HOW LONG  WILL MY PORT STAY IMPLANTED? The port can stay in for as long as your health care provider thinks it is needed. When it is time for the port to come out, surgery will be done to remove it. The procedure is similar to the one performed when the port was put in.  WHEN SHOULD I SEEK IMMEDIATE MEDICAL CARE? When you have an implanted port, you should seek immediate medical care if:   You notice a bad smell coming from the incision site.   You have swelling, redness, or drainage at the incision site.   You have more swelling or pain at the port site or the surrounding area.   You have a fever that is not controlled with medicine.   This information is not intended to  replace advice given to you by your health care provider. Make sure you discuss any questions you have with your health care provider.   Document Released: 10/20/2005 Document Revised: 08/10/2013 Document Reviewed: 06/27/2013 Elsevier Interactive Patient Education [employer].

## 2015-09-22 LAB — TYPE AND SCREEN
ABO/RH(D): O POS
Antibody Screen: NEGATIVE
UNIT DIVISION: 0

## 2015-09-24 ENCOUNTER — Encounter: Payer: Self-pay | Admitting: *Deleted

## 2015-09-24 ENCOUNTER — Telehealth: Payer: Self-pay | Admitting: Hematology and Oncology

## 2015-09-24 ENCOUNTER — Ambulatory Visit: Payer: Self-pay

## 2015-09-24 ENCOUNTER — Telehealth: Payer: Self-pay | Admitting: *Deleted

## 2015-09-24 ENCOUNTER — Encounter: Payer: Medicare Other | Admitting: Nutrition

## 2015-09-24 ENCOUNTER — Encounter: Payer: Self-pay | Admitting: Hematology and Oncology

## 2015-09-24 ENCOUNTER — Ambulatory Visit (HOSPITAL_BASED_OUTPATIENT_CLINIC_OR_DEPARTMENT_OTHER): Payer: Medicare Other | Admitting: Hematology and Oncology

## 2015-09-24 VITALS — BP 121/62 | HR 100 | Temp 98.0°F | Resp 20 | Ht 62.0 in | Wt 116.2 lb

## 2015-09-24 DIAGNOSIS — L989 Disorder of the skin and subcutaneous tissue, unspecified: Secondary | ICD-10-CM | POA: Diagnosis not present

## 2015-09-24 DIAGNOSIS — C8300 Small cell B-cell lymphoma, unspecified site: Secondary | ICD-10-CM

## 2015-09-24 DIAGNOSIS — Z515 Encounter for palliative care: Secondary | ICD-10-CM | POA: Insufficient documentation

## 2015-09-24 DIAGNOSIS — D61810 Antineoplastic chemotherapy induced pancytopenia: Secondary | ICD-10-CM | POA: Diagnosis not present

## 2015-09-24 DIAGNOSIS — I82C11 Acute embolism and thrombosis of right internal jugular vein: Secondary | ICD-10-CM

## 2015-09-24 DIAGNOSIS — E46 Unspecified protein-calorie malnutrition: Secondary | ICD-10-CM

## 2015-09-24 DIAGNOSIS — C50012 Malignant neoplasm of nipple and areola, left female breast: Secondary | ICD-10-CM

## 2015-09-24 DIAGNOSIS — C787 Secondary malignant neoplasm of liver and intrahepatic bile duct: Secondary | ICD-10-CM

## 2015-09-24 DIAGNOSIS — T451X5A Adverse effect of antineoplastic and immunosuppressive drugs, initial encounter: Secondary | ICD-10-CM

## 2015-09-24 MED ORDER — PREDNISONE 10 MG PO TABS: 10.0000 mg | ORAL_TABLET | Freq: Every day | ORAL | Status: DC

## 2015-09-24 NOTE — Assessment & Plan Note (Addendum)
Some of the liver metastasis are improving which I suspect due to disease from breast cancer. Her tumor markers are improving. Observe for now

## 2015-09-24 NOTE — Assessment & Plan Note (Signed)
I have long discussion with the patient and her family. The patient is not willing to stop chemotherapy despite decline in performance status. She has poor appetite, profound weakness with 2 recent falls and recent weight loss. Her performance status is really poor and I recommend stopping chemotherapy. Per patient request, I will refer her for second opinion. In the meantime, the patient is in agreement for palliative care consult.

## 2015-09-24 NOTE — Telephone Encounter (Signed)
Gave and printed appts ched and avs for pt for DEC  °

## 2015-09-24 NOTE — Progress Notes (Signed)
[place] OFFICE PROGRESS NOTE  Patient Care Team: [name], MD as PCP - General (Hematology and Oncology) [name], MD as Consulting Physician (Hematology and Oncology)  SUMMARY OF ONCOLOGIC HISTORY:   Cancer of left breast ([place])   08/03/2008 Initial Diagnosis she presented with fungating mass and was found to have concurrent CLL/SLL   08/30/2008 Imaging CT scan staging showed right iliac bone osseous metastasis and borderline right pelvic adenopathy   08/31/2008 Pathology Results Case #: [id number] BIopsy of left breast mass showed breast ca   09/12/2008 Imaging CT: Large ulcerative mass within the left breast is consistent with primary breast ca. There is evidence of extensive, widespread metastatic disease.Specifically, there are hypermetabolic lymph node metastasis within the left axilla, mediastinum and bone   09/23/2008 - 11/23/2010 Chemotherapy She had received neoadjuvant chemotherapy for breast cancer with letrozole followed by mastectomy and R-CVP for lymphoma   01/22/2009 Imaging CT showed response to chest w no evidence of extra osseous metastasis within the pelvis and increased sclerosis within a right iliac wing metastasis (both since the prior CT and PET.)   03/21/2009 Pathology Results [phone] left mastectomy specimen showed residual 6 cm mass, ER positive,. HER2 neg, Nottingham grade 3   03/21/2009 Surgery She had left mastectomy   09/07/2009 Imaging Interval response to therapy. Specifically, there has been improvement in mediastinal and right hilar adenopathy.   01/21/2011 Imaging CT showed new upper abdominal mesenteric lymphadenopathy. Findings are suspicious for metastatic disease.2. New pelvic lymphadenopathy, left greater than right, is suspicious for metastatic involvement.   02/03/2011 Pathology Results [phone] LN biopsy showed SLL   03/11/2011 Bone Marrow Biopsy [id number] BM biopsy showed CLL   05/13/2011 Imaging Response to therapy of pelvic  adenopathy. No evidence of residual active lymphoma.   07/22/2011 Imaging Ct showed No evidence of lymphoma recurrence within the abdomen or pelvis.   11/26/2011 - 07/08/2012 Chemotherapy She had weekly Rituxan x 4 every 6 months   06/01/2012 Imaging Ct showed Interval development of abdominal and left pelvic adenopathy most consistent with lymphoma as correlated with the chest findings.   08/12/2012 - 12/31/2012 Chemotherapy She had 6 cycles of bendamustine and rituxan   11/12/2012 Imaging Ct showed Response to therapy of pelvic adenopathy   01/03/2013 Imaging Ct showed no new or progressive neoplasm within the abdomen pelvis. 2. Stable appearance of lytic bone metastases.   01/28/2013 - 02/14/2014 Chemotherapy She received monthly rituxan   07/22/2013 Imaging Ct showed no evidence of lymphoma recurrence within the thorax.   02/20/2014 Imaging CT showed right axillary and right juxta diaphragmatic lymphadenopathy,new/increased, suggesting lymphomatous recurrence.Progressive pleural-based nodularity in the right hemithorax, alsoworrisome for recurrence.    04/03/2014 Pathology Results Accession: [id number] BIopsy showed SLL   04/03/2014 Procedure She had axillary LN biopsy   04/18/2014 - 07/11/2014 Chemotherapy She received Dyann Kief   08/04/2014 Imaging Mild increase in bilateral pulmonary metastases and liver metastases.Mixed response of thoracic adenopathy, with decreased right axillary lymphadenopathy   08/15/2014 - 01/17/2015 Chemotherapy She received Faslodex   11/06/2014 Imaging Repeat CT scan of the chest, abdomen and pelvis show regression in the size of liver metastasis and pulmonary metastasis with mild progression of lymphadenopathy   11/06/2014 Tumor Marker CA-27-29 is elevated at 275   01/18/2015 Imaging MRI head is negative for metastatic disease   01/18/2015 Imaging CT scan of the chest, abdomen and pelvis show significant disease progression throughout.   01/25/2015 - 05/15/2015 Chemotherapy She started Gemzar  every other week and Ibrutinib. Treatment  is discontinued due to progression   02/20/2015 Tumor Marker CA 27-29 at 246   03/30/2015 Imaging Repeat CT scan of the chest, abdomen and pelvis show positive response to treatment for lymphoma. Liver lesions are stable.   06/05/2015 - 06/18/2015 Radiation Therapy  she completed radiation treatment to the right supraclavicular region   07/23/2015 - 09/03/2015 Chemotherapy She received 3 cycles of RCHOP for SLL & breast ca   09/03/2015 Tumor Marker CA 27-29 at 137   09/21/2015 Imaging CT scan showed mixed response   INTERVAL HISTORY: Please see below for problem oriented charting.  she continues to have poor appetite and progressive weight loss. Due to recent gum bleeding, Xarelto was discontinued. She had recurrent falls over the weekend due to weakness on her legs. She did not sustain major injuries.  she complained of mild facial swelling and hair loss. She also complained multiple new skin nodules over her chest wall  REVIEW OF SYSTEMS:   Constitutional: Denies fevers, chills  Eyes: Denies blurriness of vision Ears, nose, mouth, throat, and face: Denies mucositis or sore throat Respiratory: Denies cough, dyspnea or wheezes Cardiovascular: Denies palpitation, chest discomfort o Gastrointestinal:  Denies nausea, heartburn or change in bowel habits Lymphatics: Denies new lymphadenopathy or easy bruising Neurological:Denies numbness, tingling Behavioral/Psych: Mood is stable, no new changes  All other systems were reviewed with the patient and are negative.  I have reviewed the past medical history, past surgical history, social history and family history with the patient and they are unchanged from previous note.  ALLERGIES:  is allergic to codeine; penicillins; sulfonamide derivatives; tape; and tramadol hcl.  MEDICATIONS:  Current Outpatient Prescriptions  Medication Sig Dispense Refill  . HYDROcodone-acetaminophen (NORCO) 5-325 MG per tablet  Take 1 tablet by mouth every 6 (six) hours as needed for moderate pain. 60 tablet 0  . predniSONE (DELTASONE) 10 MG tablet Take 1 tablet (10 mg total) by mouth daily with breakfast. 30 tablet 1  . predniSONE (DELTASONE) 20 MG tablet Take 3 tablets (60 mg total) by mouth daily. Take on days 2-5 of chemotherapy. (Patient not taking: Reported on 09/24/2015) 12 tablet 6  . rivaroxaban (XARELTO) 20 MG TABS tablet Take 1 tablet (20 mg total) by mouth daily with supper. (Patient not taking: Reported on 09/24/2015) 30 tablet 6   No current facility-administered medications for this visit.    PHYSICAL EXAMINATION: ECOG PERFORMANCE STATUS: 3 - Symptomatic, >50% confined to bed  Filed Vitals:   09/24/15 0911  BP: 121/62  Pulse: 100  Temp: 98 F (36.7 C)  Resp: 20   Filed Weights   09/24/15 0911  Weight: 116 lb 3.2 oz (52.708 kg)    GENERAL:alert, no distress and comfortable. She looked thin and cachectic SKIN:  She has areas skin nodules throughout the chest wall that are not vesicular in nature. EYES: normal, Conjunctiva are pale and non-injected, sclera clear OROPHARYNX:no exudate, no erythema and lips, buccal mucosa, and tongue normal  NECK: supple, thyroid normal size, non-tender, without nodularity LYMPH:  no palpable lymphadenopathy in the cervical, axillary or inguinal HEART: she has bilateral lower extremity edema Musculoskeletal:no cyanosis of digits and no clubbing  NEURO: alert & oriented x 3 with fluent speech, no focal motor/sensory deficits  LABORATORY DATA:  I have reviewed the data as listed    Component Value Date/Time   NA 137 09/21/2015 0843   NA 139 01/13/2015 1730   NA 142 06/01/2012 1008   K 3.2* 09/21/2015 0843   K 4.0  01/13/2015 1730   K 3.8 06/01/2012 1008   CL 103 01/13/2015 1730   CL 107 04/22/2013 0922   CL 103 06/01/2012 1008   CO2 29 09/21/2015 0843   CO2 25 06/13/2014 0819   CO2 25 06/01/2012 1008   GLUCOSE 96 09/21/2015 0843   GLUCOSE 93  01/13/2015 1730   GLUCOSE 89 04/22/2013 0922   GLUCOSE 108 06/01/2012 1008   BUN 4.3* 09/21/2015 0843   BUN 12 01/13/2015 1730   BUN 9 06/01/2012 1008   CREATININE 0.6 09/21/2015 0843   CREATININE 0.80 01/13/2015 1730   CREATININE 0.9 06/01/2012 1008   CALCIUM 9.2 09/21/2015 0843   CALCIUM 10.8* 06/13/2014 0819   CALCIUM 9.8 06/01/2012 1008   PROT 5.7* 09/21/2015 0843   PROT 7.5 06/13/2014 0819   PROT 8.1 08/14/2010 1251   ALBUMIN 2.7* 09/21/2015 0843   ALBUMIN 3.9 06/13/2014 0819   ALBUMIN 3.4 08/14/2010 1251   AST 20 09/21/2015 0843   AST 23 06/13/2014 0819   AST 19 08/14/2010 1251   ALT 11 09/21/2015 0843   ALT 13 06/13/2014 0819   ALT 9* 08/14/2010 1251   ALKPHOS 87 09/21/2015 0843   ALKPHOS 89 06/13/2014 0819   ALKPHOS 78 08/14/2010 1251   BILITOT 0.42 09/21/2015 0843   BILITOT 0.2* 06/13/2014 0819   BILITOT 0.40 08/14/2010 1251   GFRNONAA 87* 07/06/2012 1122   GFRAA >90 07/06/2012 1122    No results found for: SPEP, UPEP  Lab Results  Component Value Date   WBC 10.9* 09/21/2015   NEUTROABS 9.5* 09/21/2015   HGB 7.9* 09/21/2015   HCT 24.8* 09/21/2015   MCV 78.6* 09/21/2015   PLT 347 09/21/2015      Chemistry      Component Value Date/Time   NA 137 09/21/2015 0843   NA 139 01/13/2015 1730   NA 142 06/01/2012 1008   K 3.2* 09/21/2015 0843   K 4.0 01/13/2015 1730   K 3.8 06/01/2012 1008   CL 103 01/13/2015 1730   CL 107 04/22/2013 0922   CL 103 06/01/2012 1008   CO2 29 09/21/2015 0843   CO2 25 06/13/2014 0819   CO2 25 06/01/2012 1008   BUN 4.3* 09/21/2015 0843   BUN 12 01/13/2015 1730   BUN 9 06/01/2012 1008   CREATININE 0.6 09/21/2015 0843   CREATININE 0.80 01/13/2015 1730   CREATININE 0.9 06/01/2012 1008      Component Value Date/Time   CALCIUM 9.2 09/21/2015 0843   CALCIUM 10.8* 06/13/2014 0819   CALCIUM 9.8 06/01/2012 1008   ALKPHOS 87 09/21/2015 0843   ALKPHOS 89 06/13/2014 0819   ALKPHOS 78 08/14/2010 1251   AST 20 09/21/2015 0843    AST 23 06/13/2014 0819   AST 19 08/14/2010 1251   ALT 11 09/21/2015 0843   ALT 13 06/13/2014 0819   ALT 9* 08/14/2010 1251   BILITOT 0.42 09/21/2015 0843   BILITOT 0.2* 06/13/2014 0819   BILITOT 0.40 08/14/2010 1251       RADIOGRAPHIC STUDIES: I reviewed recent imaging study with the patient and family I have personally reviewed the radiological images as listed and agreed with the findings in the report.    ASSESSMENT & PLAN:  Lymphoma, small lymphocytic  Overall, she has disease progression from the lymphoma standpoint. Effort areas where her disease regressed corresponded to recent radiation sites. The patient is quite weak and her performance status is borderline between 2-3. She has lost a lot of weight with poor energy and  appetite which I suspect could be related to cancer. Despite her feeling so weak, she desired more treatment. Frankly, I would not recommend chemotherapy. I recommend palliative care at home as the patient is not willing to sign up to hospice. After a lot of discussion, the patient wants to a second opinion and I would refer her to [facility] for second opinion. In the meantime, she is in agreement for palliative care service at home.   Cancer of left breast  Tumor markers are improving and the liver metastasis are regressing. I suspect the disease in the liver corresponding to metastatic breast cancer which is responding. However, showed a new subcutaneous skin nodules that could very well be disease related to breast cancer. Overall, she is not symptomatic and I would observe only for now  Liver metastasis Some of the liver metastasis are improving which I suspect due to disease from breast cancer. Her tumor markers are improving. Observe for now  Pancytopenia due to antineoplastic chemotherapy [facility])  She received blood transfusion last week on 09/21/2015. The pancytopenia was related to recent treatment. Bone marrow infiltration is  also a possibility. Plan to see her back in 3 weeks for further assessment and transfusion as needed  Acute thrombosis of right internal jugular vein  The patient was put on Xarelto due to acute thrombosis of the right internal jugular vein from cancer. Since then, she has developed significant collateral vessels around the region and recent CT scan showed disease improvement. She had recent bleeding and I recommend she stops Xarelto for now  Protein calorie malnutrition ([place])  She has cancer cachexia.  while arranging for second opinion, I recommend low-dose prednisone daily  Skin lesions  She has multiple skin nodules on her chest wall and her back. They do not look vesicular or infectious in nature. The last I have encountered such skin lesions in another patient was related to metastatic cancer to the skin. We discussed options including possible skin biopsy but since it does not bother the patient, we will observe for now. I will reevaluate in the next visit  Palliative care encounter  I have long discussion with the patient and her family. The patient is not willing to stop chemotherapy despite decline in performance status. She has poor appetite, profound weakness with 2 recent falls and recent weight loss. Her performance status is really poor and I recommend stopping chemotherapy. Per patient request, I will refer her for second opinion. In the meantime, the patient is in agreement for palliative care consult.   Orders Placed This Encounter  Procedures  . Amb Referral to Palliative Care    Referral Priority:  Routine    Referral Type:  Consultation    Number of Visits Requested:  1  . Hold Tube, Blood Bank    Standing Status: Standing     Number of Occurrences: 9     Standing Expiration Date: 09/23/2016   All questions were answered. The patient knows to call the clinic with any problems, questions or concerns. No barriers to learning was detected. I spent 30 minutes  counseling the patient face to face. The total time spent in the appointment was 40 minutes and more than 50% was on counseling and review of test results     [facility], Eva, MD 09/24/2015 10:19 AM

## 2015-09-24 NOTE — Assessment & Plan Note (Signed)
Overall, she has disease progression from the lymphoma standpoint. Effort areas where her disease regressed corresponded to recent radiation sites. The patient is quite weak and her performance status is borderline between 2-3. She has lost a lot of weight with poor energy and appetite which I suspect could be related to cancer. Despite her feeling so weak, she desired more treatment. Frankly, I would not recommend chemotherapy. I recommend palliative care at home as the patient is not willing to sign up to hospice. After a lot of discussion, the patient wants to a second opinion and I would refer her to [facility] for second opinion. In the meantime, she is in agreement for palliative care service at home.

## 2015-09-24 NOTE — Assessment & Plan Note (Signed)
She received blood transfusion last week on 09/21/2015. The pancytopenia was related to recent treatment. Bone marrow infiltration is also a possibility. Plan to see her back in 3 weeks for further assessment and transfusion as needed

## 2015-09-24 NOTE — Assessment & Plan Note (Signed)
She has multiple skin nodules on her chest wall and her back. They do not look vesicular or infectious in nature. The last I have encountered such skin lesions in another patient was related to metastatic cancer to the skin. We discussed options including possible skin biopsy but since it does not bother the patient, we will observe for now. I will reevaluate in the next visit

## 2015-09-24 NOTE — Assessment & Plan Note (Signed)
Tumor markers are improving and the liver metastasis are regressing. I suspect the disease in the liver corresponding to metastatic breast cancer which is responding. However, showed a new subcutaneous skin nodules that could very well be disease related to breast cancer. Overall, she is not symptomatic and I would observe only for now

## 2015-09-24 NOTE — Assessment & Plan Note (Signed)
The patient was put on Xarelto due to acute thrombosis of the right internal jugular vein from cancer. Since then, she has developed significant collateral vessels around the region and recent CT scan showed disease improvement. She had recent bleeding and I recommend she stops Xarelto for now

## 2015-09-24 NOTE — Telephone Encounter (Signed)
Referral to [facility] for Helen Chang, NP to see pt in home.  Dr. Alvy Bimler request he advise [place] on what type of home equipment is needed for pt at home.  She has requested a w/c and walker today.   S/w Yvette at [facility] w/ above message.

## 2015-09-24 NOTE — Progress Notes (Signed)
Request for Referral to [facility] for Second opinion sent by "in basket" message to Charlie Pitter and Rhys Martini in HIM.   Also left VM for Charlie Pitter to notify of this request sent.

## 2015-09-24 NOTE — Assessment & Plan Note (Signed)
She has cancer cachexia.  while arranging for second opinion, I recommend low-dose prednisone daily

## 2015-09-25 ENCOUNTER — Telehealth: Payer: Self-pay | Admitting: Hematology and Oncology

## 2015-09-25 NOTE — Telephone Encounter (Signed)
Pt appt to see Dr. Sedonia Small @ [facility] is 10/12/15@9 :00. Pt is aware

## 2015-09-26 ENCOUNTER — Telehealth: Payer: Self-pay | Admitting: *Deleted

## 2015-09-26 NOTE — Telephone Encounter (Signed)
Call from Billey Chang, NP at [facility].  He visited w/ pt and son this morning.  They have agreed to [facility]  She may still keep appt for second opinion at [facility] and can revoke Hospice if she decides on any treatment at [facility].    Referral made to [place] with [facility].   Dr. Alvy Bimler will be attending and Hospice MD to please provide symptom management orders.

## 2015-09-27 ENCOUNTER — Encounter (HOSPITAL_COMMUNITY): Payer: Self-pay | Admitting: Emergency Medicine

## 2015-09-27 ENCOUNTER — Inpatient Hospital Stay (HOSPITAL_COMMUNITY)
Admission: EM | Admit: 2015-09-27 | Discharge: 2015-10-02 | DRG: 597 | Disposition: A | Discharge status: Skilled Nursing Facility | Payer: Medicare Other | Attending: Internal Medicine | Admitting: Internal Medicine

## 2015-09-27 DIAGNOSIS — Z6821 Body mass index (BMI) 21.0-21.9, adult: Secondary | ICD-10-CM

## 2015-09-27 DIAGNOSIS — L989 Disorder of the skin and subcutaneous tissue, unspecified: Secondary | ICD-10-CM | POA: Diagnosis present

## 2015-09-27 DIAGNOSIS — M7989 Other specified soft tissue disorders: Secondary | ICD-10-CM | POA: Diagnosis not present

## 2015-09-27 DIAGNOSIS — C799 Secondary malignant neoplasm of unspecified site: Secondary | ICD-10-CM | POA: Diagnosis not present

## 2015-09-27 DIAGNOSIS — Z515 Encounter for palliative care: Secondary | ICD-10-CM | POA: Diagnosis not present

## 2015-09-27 DIAGNOSIS — E876 Hypokalemia: Secondary | ICD-10-CM | POA: Diagnosis present

## 2015-09-27 DIAGNOSIS — I82C11 Acute embolism and thrombosis of right internal jugular vein: Secondary | ICD-10-CM | POA: Diagnosis present

## 2015-09-27 DIAGNOSIS — Z66 Do not resuscitate: Secondary | ICD-10-CM | POA: Diagnosis present

## 2015-09-27 DIAGNOSIS — R609 Edema, unspecified: Secondary | ICD-10-CM

## 2015-09-27 DIAGNOSIS — E43 Unspecified severe protein-calorie malnutrition: Secondary | ICD-10-CM | POA: Diagnosis present

## 2015-09-27 DIAGNOSIS — Z9010 Acquired absence of unspecified breast and nipple: Secondary | ICD-10-CM

## 2015-09-27 DIAGNOSIS — N179 Acute kidney failure, unspecified: Secondary | ICD-10-CM | POA: Diagnosis not present

## 2015-09-27 DIAGNOSIS — R600 Localized edema: Secondary | ICD-10-CM | POA: Diagnosis present

## 2015-09-27 DIAGNOSIS — Z9221 Personal history of antineoplastic chemotherapy: Secondary | ICD-10-CM

## 2015-09-27 DIAGNOSIS — C801 Malignant (primary) neoplasm, unspecified: Secondary | ICD-10-CM | POA: Diagnosis not present

## 2015-09-27 DIAGNOSIS — Z7952 Long term (current) use of systemic steroids: Secondary | ICD-10-CM | POA: Diagnosis not present

## 2015-09-27 DIAGNOSIS — Z86718 Personal history of other venous thrombosis and embolism: Secondary | ICD-10-CM

## 2015-09-27 DIAGNOSIS — Z853 Personal history of malignant neoplasm of breast: Secondary | ICD-10-CM | POA: Diagnosis not present

## 2015-09-27 DIAGNOSIS — C50919 Malignant neoplasm of unspecified site of unspecified female breast: Principal | ICD-10-CM | POA: Diagnosis present

## 2015-09-27 DIAGNOSIS — C8590 Non-Hodgkin lymphoma, unspecified, unspecified site: Secondary | ICD-10-CM | POA: Diagnosis present

## 2015-09-27 DIAGNOSIS — R627 Adult failure to thrive: Secondary | ICD-10-CM | POA: Diagnosis present

## 2015-09-27 DIAGNOSIS — C7951 Secondary malignant neoplasm of bone: Secondary | ICD-10-CM | POA: Diagnosis present

## 2015-09-27 DIAGNOSIS — Z7189 Other specified counseling: Secondary | ICD-10-CM

## 2015-09-27 DIAGNOSIS — Z923 Personal history of irradiation: Secondary | ICD-10-CM

## 2015-09-27 DIAGNOSIS — C50912 Malignant neoplasm of unspecified site of left female breast: Secondary | ICD-10-CM

## 2015-09-27 DIAGNOSIS — R6251 Failure to thrive (child): Secondary | ICD-10-CM | POA: Diagnosis present

## 2015-09-27 LAB — URINALYSIS, ROUTINE W REFLEX MICROSCOPIC
Bilirubin Urine: NEGATIVE
Glucose, UA: NEGATIVE mg/dL
Hgb urine dipstick: NEGATIVE
Ketones, ur: NEGATIVE mg/dL
Leukocytes, UA: NEGATIVE
Nitrite: NEGATIVE
Protein, ur: NEGATIVE mg/dL
Specific Gravity, Urine: 1.005 (ref 1.005–1.030)
pH: 6 (ref 5.0–8.0)

## 2015-09-27 LAB — CBC WITH DIFFERENTIAL/PLATELET
Basophils Absolute: 0 10*3/uL (ref 0.0–0.1)
Basophils Relative: 0 %
Eosinophils Absolute: 0 10*3/uL (ref 0.0–0.7)
Eosinophils Relative: 0 %
HCT: 28.9 % — ABNORMAL LOW (ref 36.0–46.0)
Hemoglobin: 9.1 g/dL — ABNORMAL LOW (ref 12.0–15.0)
Lymphocytes Relative: 6 %
Lymphs Abs: 0.6 10*3/uL — ABNORMAL LOW (ref 0.7–4.0)
MCH: 25.8 pg — ABNORMAL LOW (ref 26.0–34.0)
MCHC: 31.5 g/dL (ref 30.0–36.0)
MCV: 81.9 fL (ref 78.0–100.0)
Monocytes Absolute: 0.6 10*3/uL (ref 0.1–1.0)
Monocytes Relative: 6 %
Neutro Abs: 8.2 10*3/uL — ABNORMAL HIGH (ref 1.7–7.7)
Neutrophils Relative %: 88 %
Platelets: 314 10*3/uL (ref 150–400)
RBC: 3.53 MIL/uL — ABNORMAL LOW (ref 3.87–5.11)
RDW: 18.9 % — ABNORMAL HIGH (ref 11.5–15.5)
WBC: 9.4 10*3/uL (ref 4.0–10.5)

## 2015-09-27 LAB — I-STAT CHEM 8, ED
BUN: 23 mg/dL — ABNORMAL HIGH (ref 6–20)
Calcium, Ion: 1.16 mmol/L (ref 1.13–1.30)
Chloride: 101 mmol/L (ref 101–111)
Creatinine, Ser: 0.9 mg/dL (ref 0.44–1.00)
Glucose, Bld: 128 mg/dL — ABNORMAL HIGH (ref 65–99)
HCT: 28 % — ABNORMAL LOW (ref 36.0–46.0)
Hemoglobin: 9.5 g/dL — ABNORMAL LOW (ref 12.0–15.0)
Potassium: 2.8 mmol/L — ABNORMAL LOW (ref 3.5–5.1)
Sodium: 137 mmol/L (ref 135–145)
TCO2: 23 mmol/L (ref 0–100)

## 2015-09-27 MED ORDER — FUROSEMIDE 10 MG/ML IJ SOLN
40.0000 mg | Freq: Once | INTRAMUSCULAR | Status: DC
Start: 2015-09-27 — End: 2015-09-27
  Filled 2015-09-27: qty 4

## 2015-09-27 MED ORDER — POTASSIUM CHLORIDE 10 MEQ/100ML IV SOLN
10.0000 meq | Freq: Once | INTRAVENOUS | Status: DC
  Filled 2015-09-27: qty 100

## 2015-09-27 NOTE — ED Provider Notes (Signed)
CSN: [id number]     Arrival date & time 09/27/15  2044 History   First MD Initiated Contact with Patient 09/27/15 2109     Chief Complaint  Patient presents with  . Leg Swelling  . Cancer  . Weakness   HPI    80 year old female with a history of lymphoma, breast cancer, with metastasis to the liver with pancytopenia and protein calorie malnutrition presents today with bilateral lower extremity pitting edema. Patient notes that at her last oncological appointment she was having bilateral lower extremity edema mainly in the feet and ankles that has progressed over the last several days to her knees. She reports worsening fatigue, weakness of the lower extremities, with difficulty ambulating with frequent falls.  Family notes that patient is no longer able to ambulate her around, cannot get to the restroom, and has significantly decreased by mouth intake. Patient lives at the independent living facility with no resources available for help, she has agreed to hospice home care. Patient has a history of pancytopenia with most recent blood transfusion on 09/21/2015.     Past Medical History  Diagnosis Date  . Arthritis   . Blood transfusion 2009  . Hypertension   . Seizures ([place]) 1980's    from medication reaction/Pt.  [name] Hx of radiation therapy 09/11/08 -10/31/08    left breast  . History of radiation therapy eot 07/30/12    lumbar spine L4 /l shoulder  . Cough 10/10/2014  . Breast CA ([place]) 08/2008    (LT) breast ca dx 10/09/Chemo  . Lymphoma ([place]) 09/09/2011    NHL  . Leukemia, acute, in remission ([place]) 2012    Pt. not sure of type  . Metastasis to bone ([place]) 07/03/2012    MRI L spine  . Clicking tinnitus of left ear 01/02/2015  . Diarrhea 02/06/2015  . Pneumonia due to other specified infectious organisms 04/03/2015  . Hypokalemia 04/17/2015  . Right leg DVT ([place]) 05/29/2015  . S/P radiation therapy 06/05/15-06/18/15    right neck 25Gy/27fx   Past Surgical History  Procedure Laterality Date   . Mastectomy    . Thyroidectomy  1965  . Appendectomy    . Tonsillectomy     Family History  Problem Relation Age of Onset  . Alcohol abuse Other   . Drug abuse Other   . Breast cancer Mother   . Prostate cancer Father   . Cancer Father     colon  . Colon cancer Brother   . Esophageal cancer Neg Hx   . Rectal cancer Neg Hx   . Stomach cancer Neg Hx    Social History  Substance Use Topics  . Smoking status: Never Smoker   . Smokeless tobacco: Never Used  . Alcohol Use: No   OB History    No data available     Review of Systems  All other systems reviewed and are negative.   Allergies  Codeine; Penicillins; Sulfonamide derivatives; Tape; and Tramadol hcl  Home Medications   Prior to Admission medications   Medication Sig Start Date End Date Taking? Authorizing Provider  HYDROcodone-acetaminophen (NORCO) 5-325 MG per tablet Take 1 tablet by mouth every 6 (six) hours as needed for moderate pain. 05/29/15  Yes [name], MD  predniSONE (DELTASONE) 10 MG tablet Take 1 tablet (10 mg total) by mouth daily with breakfast. 09/24/15  Yes [name], MD  predniSONE (DELTASONE) 20 MG tablet Take 3 tablets (60 mg total) by mouth daily. Take on days 2-5 of  chemotherapy. Patient not taking: Reported on 09/24/2015 08/13/15   [name], MD  rivaroxaban (XARELTO) 20 MG TABS tablet Take 1 tablet (20 mg total) by mouth daily with supper. Patient not taking: Reported on 09/24/2015 07/03/15   Ni Gorsuch, MD   BP 124/66 mmHg  Pulse 87  Temp(Src) 97.8 F (36.6 C) (Oral)  Resp 22  Ht 5\' 2"  (1.575 m)  Wt 53.524 kg  BMI 21.58 kg/m2  SpO2 100%   Physical Exam  Constitutional: She is oriented to person, place, and time.  Poorly nourished   HENT:  Head: Normocephalic and atraumatic.  Eyes: Conjunctivae are normal. Pupils are equal, round, and reactive to light. Right eye exhibits no discharge. Left eye exhibits no discharge. No scleral icterus.  Neck: Normal range of motion. No JVD  present. No tracheal deviation present.  Pulmonary/Chest: Effort normal. No stridor. She has rales.  Abdominal: There is no tenderness.  Musculoskeletal: She exhibits edema.  Bilateral lower extremity edema   Neurological: She is alert and oriented to person, place, and time. Coordination normal.  Skin: Skin is warm and dry. No rash noted. No erythema.  Psychiatric: She has a normal mood and affect. Her behavior is normal. Judgment and thought content normal.  Nursing note and vitals reviewed.   ED Course  Procedures (including critical care time) Labs Review Labs Reviewed  CBC WITH DIFFERENTIAL/PLATELET - Abnormal; Notable for the following:    RBC 3.53 (*)    Hemoglobin 9.1 (*)    HCT 28.9 (*)    MCH 25.8 (*)    RDW 18.9 (*)    Neutro Abs 8.2 (*)    Lymphs Abs 0.6 (*)    All other components within normal limits  I-STAT CHEM 8, ED - Abnormal; Notable for the following:    Potassium 2.8 (*)    BUN 23 (*)    Glucose, Bld 128 (*)    Hemoglobin 9.5 (*)    HCT 28.0 (*)    All other components within normal limits  URINALYSIS, ROUTINE W REFLEX MICROSCOPIC (NOT AT [facility])  BRAIN NATRIURETIC PEPTIDE    Imaging Review No results found. I have personally reviewed and evaluated these images and lab results as part of my medical decision-making.   EKG Interpretation None      MDM   Final diagnoses:  Metastatic cancer ([place])  Peripheral edema  Failure to thrive in adult    Labs: I-STAT Chem-8, CBC, urinalysis- potassium 2.8  Imaging:  Consults:  Therapeutics: Potassium chloride  Discharge Meds:   Assessment/Plan: 80 year old female with a history of metastatic cancer presents today with inability to thrive, unable to ambulate and care for herself. Patient has significant lower extremity swelling or edema, with recent falls. Due to patient's inability to be safe at home With worsening lower extremity edema hospitalist was consulted for admission for further evaluation  and management. I hospitalist service personally evaluated the patient and agreed to admit.         Okey Regal, PA-C 09/28/15 [id number]  Blanchie Dessert, MD 09/29/15 [id number]

## 2015-09-27 NOTE — ED Notes (Signed)
Pt. Is unable to give urine sample at this time. 

## 2015-09-27 NOTE — ED Notes (Signed)
Patient c/o bilateral lower leg swelling, 1+ pitting edema noted to same, history of leukemia, unsure of last chemo treatment, blood transfusion approximately 1 week ago, c/o increased weakness, no neuro deficits noted, denies pain.

## 2015-09-27 NOTE — ED Notes (Signed)
Per Susie in main lab, BNP to be added on to blood drawn at 2200hrs

## 2015-09-27 NOTE — ED Notes (Signed)
Admitting MD at bedside.

## 2015-09-27 NOTE — ED Notes (Signed)
MD at bedside. 

## 2015-09-28 ENCOUNTER — Inpatient Hospital Stay (HOSPITAL_COMMUNITY): Payer: Medicare Other

## 2015-09-28 DIAGNOSIS — C50919 Malignant neoplasm of unspecified site of unspecified female breast: Principal | ICD-10-CM

## 2015-09-28 DIAGNOSIS — C799 Secondary malignant neoplasm of unspecified site: Secondary | ICD-10-CM

## 2015-09-28 DIAGNOSIS — R6251 Failure to thrive (child): Secondary | ICD-10-CM | POA: Diagnosis present

## 2015-09-28 DIAGNOSIS — Z7189 Other specified counseling: Secondary | ICD-10-CM

## 2015-09-28 DIAGNOSIS — Z515 Encounter for palliative care: Secondary | ICD-10-CM

## 2015-09-28 DIAGNOSIS — M7989 Other specified soft tissue disorders: Secondary | ICD-10-CM | POA: Diagnosis present

## 2015-09-28 DIAGNOSIS — R609 Edema, unspecified: Secondary | ICD-10-CM

## 2015-09-28 DIAGNOSIS — C801 Malignant (primary) neoplasm, unspecified: Secondary | ICD-10-CM

## 2015-09-28 LAB — BASIC METABOLIC PANEL
Anion gap: 12 (ref 5–15)
BUN: 25 mg/dL — AB (ref 6–20)
CO2: 23 mmol/L (ref 22–32)
CREATININE: 0.9 mg/dL (ref 0.44–1.00)
Calcium: 8.9 mg/dL (ref 8.9–10.3)
Chloride: 104 mmol/L (ref 101–111)
GFR calc Af Amer: >60 mL/min (ref >60)
GFR, EST NON AFRICAN AMERICAN: 60 mL/min — AB (ref >60)
GLUCOSE: 94 mg/dL (ref 65–99)
Potassium: 2.9 mmol/L — ABNORMAL LOW (ref 3.5–5.1)
SODIUM: 139 mmol/L (ref 135–145)

## 2015-09-28 LAB — BRAIN NATRIURETIC PEPTIDE: B Natriuretic Peptide: 64.6 pg/mL (ref 0.0–100.0)

## 2015-09-28 MED ORDER — MORPHINE SULFATE (PF) 2 MG/ML IV SOLN
2.0000 mg | INTRAVENOUS | Status: DC | PRN
  Administered 2015-09-29 – 2015-10-01 (×3): 2 mg via INTRAVENOUS
  Filled 2015-09-28 (×3): qty 1

## 2015-09-28 MED ORDER — MAGNESIUM SULFATE 2 GM/50ML IV SOLN
2.0000 g | Freq: Once | INTRAVENOUS | Status: AC
  Administered 2015-09-28: 2 g via INTRAVENOUS
  Filled 2015-09-28: qty 50

## 2015-09-28 MED ORDER — FUROSEMIDE 20 MG PO TABS: 20.0000 mg | ORAL_TABLET | Freq: Two times a day (BID) | ORAL | Status: DC

## 2015-09-28 MED ORDER — HYDROCODONE-ACETAMINOPHEN 5-325 MG PO TABS: 1.0000 | ORAL_TABLET | Freq: Four times a day (QID) | ORAL | Status: DC | PRN

## 2015-09-28 MED ORDER — SENNOSIDES-DOCUSATE SODIUM 8.6-50 MG PO TABS: 1.0000 | ORAL_TABLET | Freq: Every evening | ORAL | Status: DC | PRN

## 2015-09-28 MED ORDER — ONDANSETRON HCL 4 MG/2ML IJ SOLN: 4.0000 mg | Freq: Four times a day (QID) | INTRAMUSCULAR | Status: DC | PRN

## 2015-09-28 MED ORDER — ENOXAPARIN SODIUM 40 MG/0.4ML ~~LOC~~ SOLN
40.0000 mg | SUBCUTANEOUS | Status: DC
  Administered 2015-09-28 – 2015-09-30 (×3): 40 mg via SUBCUTANEOUS
  Filled 2015-09-28 (×3): qty 0.4

## 2015-09-28 MED ORDER — SODIUM CHLORIDE 0.9 % IJ SOLN: 3.0000 mL | INTRAMUSCULAR | Status: DC | PRN

## 2015-09-28 MED ORDER — ONDANSETRON HCL 4 MG PO TABS: 4.0000 mg | ORAL_TABLET | Freq: Four times a day (QID) | ORAL | Status: DC | PRN

## 2015-09-28 MED ORDER — ENSURE ENLIVE PO LIQD
237.0000 mL | Freq: Two times a day (BID) | ORAL | Status: DC
  Administered 2015-09-28 – 2015-10-02 (×9): 237 mL via ORAL

## 2015-09-28 MED ORDER — PREDNISONE 5 MG PO TABS
10.0000 mg | ORAL_TABLET | Freq: Every day | ORAL | Status: DC
  Administered 2015-09-28 – 2015-10-02 (×5): 10 mg via ORAL
  Filled 2015-09-28 (×5): qty 2

## 2015-09-28 MED ORDER — ZOLPIDEM TARTRATE 5 MG PO TABS: 5.0000 mg | ORAL_TABLET | Freq: Every evening | ORAL | Status: DC | PRN

## 2015-09-28 MED ORDER — POTASSIUM CHLORIDE 10 MEQ/100ML IV SOLN
10.0000 meq | INTRAVENOUS | Status: AC
  Administered 2015-09-28 (×2): 10 meq via INTRAVENOUS
  Filled 2015-09-28 (×2): qty 100

## 2015-09-28 MED ORDER — SODIUM CHLORIDE 0.9 % IV SOLN: 250.0000 mL | INTRAVENOUS | Status: DC | PRN

## 2015-09-28 MED ORDER — ACETAMINOPHEN 325 MG PO TABS: 650.0000 mg | ORAL_TABLET | Freq: Four times a day (QID) | ORAL | Status: DC | PRN

## 2015-09-28 MED ORDER — FUROSEMIDE 10 MG/ML IJ SOLN
20.0000 mg | Freq: Two times a day (BID) | INTRAMUSCULAR | Status: DC
  Administered 2015-09-28 – 2015-09-29 (×3): 20 mg via INTRAVENOUS
  Filled 2015-09-28 (×3): qty 2

## 2015-09-28 MED ORDER — SODIUM CHLORIDE 0.9 % IJ SOLN
3.0000 mL | Freq: Two times a day (BID) | INTRAMUSCULAR | Status: DC
  Administered 2015-09-28 – 2015-09-29 (×3): 3 mL via INTRAVENOUS

## 2015-09-28 MED ORDER — POTASSIUM CHLORIDE CRYS ER 20 MEQ PO TBCR
60.0000 meq | EXTENDED_RELEASE_TABLET | Freq: Four times a day (QID) | ORAL | Status: AC
  Administered 2015-09-28 (×2): 60 meq via ORAL
  Filled 2015-09-28 (×2): qty 3

## 2015-09-28 MED ORDER — ACETAMINOPHEN 650 MG RE SUPP: 650.0000 mg | Freq: Four times a day (QID) | RECTAL | Status: DC | PRN

## 2015-09-28 NOTE — Care Management Note (Signed)
Case Management Note  Patient Details  Name: Helen [name] MRN: [id number] Date of Birth: Jan 17, 1935  Subjective/Objective:      chf with 3+ pitting edema to legs and decreased intake with weight gain.              Action/Plan:Date: September 28, 2015 Chart reviewed for concurrent status and case management needs. Will continue to follow patient for changes and needs: Velva Harman, RN, BSN, [place]   [phone]   Expected Discharge Date:                  Expected Discharge Plan:  Home w Hospice Care  In-House Referral:  NA  Discharge planning Services  CM Consult  Post Acute Care Choice:  NA Choice offered to:  NA  DME Arranged:    DME Agency:     HH Arranged:    [place] Agency:     Status of Service:  In process, will continue to follow  Medicare Important Message Given:    Date Medicare IM Given:    Medicare IM give by:    Date Additional Medicare IM Given:    Additional Medicare Important Message give by:     If discussed at [place] of Stay Meetings, dates discussed:    Additional Comments:  Leeroy Cha, RN 09/28/2015, 11:13 AM

## 2015-09-28 NOTE — Consult Note (Signed)
Consultation Note Date: 09/28/2015   Patient Name: Helen [name]  DOB: 12-12-1934  MRN: [id number]  Age / Sex: 80 y.o., female  PCP: [name], MD Referring Physician: Verlee Monte, MD  Reason for Consultation: Establishing goals of care and Psychosocial/spiritual support    Clinical Assessment/Narrative:    This NP Wadie Lessen reviewed medical records, received report from team, assessed the patient and then meet at the patient's bedside along with family to include her son/daughter/grand-daughter/ DIL  to discuss diagnosis, prognosis, GOC, EOL wishes disposition and options.   A detailed discussion was had today regarding advanced directives.  Concepts specific to code status, artifical feeding and hydration, continued IV antibiotics and rehospitalization was had.  The difference between a aggressive medical intervention path  and a palliative comfort care path for this patient at this time was had.  Values and goals of care important to patient and family were attempted to be elicited.  Concept of Hospice and Palliative Care were discussed  Natural trajectory and expectations at EOL were discussed.  Questions and concerns addressed.  Family encouraged to call with questions or concerns.  PMT will continue to support holistically.  Helen [name] is a 80 y.o. female, with past medical history significant for metastatic breast cancer, lymphoma, end-stage, who was scheduled to have hospice started next week in her home, presenting with generalized weakness, lower extremity swelling and falls the last 2 days.  Dr Alvy Bimler is her oncologist.  She has had slow physical and function decline but this last week has been dramatic.  Patient and family understand the long term poor prognosis but are hopeful for improvement and "More" quality time.  They understand the limited treatment options for her cancer diagnosis and are  not interested in a second opinion.   Primary Decision Maker: self with family support  HCPOA: no    SUMMARY OF RECOMMENDATIONS  - DNR/DNI-documented today -treat the treatable, hopeful for improvement -SNF for short term rehabilitation will write for PT evaluation and treatment -once home, after rehab attempt,  trigger hospice consult     Code Status/Advance Care Planning:  DNR      Code Status Orders        Start     Ordered   09/28/15 0205  Full code   Continuous     09/28/15 0204      Other Directives:None  Symptom Management:   Weakness: PT evaluation and treatment   Palliative Prophylaxis:   Bowel Regimen, Delirium Protocol, Frequent Pain Assessment, Oral Care and Turn Reposition    Psycho-social/Spiritual:  Support System: Strong  Additional Recommendations: Education on Hospice  Prognosis: < than six months  Discharge Planning: SNF for rehab after PT assessment  Chief Complaint/ Primary Diagnoses: Present on Admission:  . Failure to thrive (0-17)  I have reviewed the medical record, interviewed the patient and family, and examined the patient. The following aspects are pertinent.  Past Medical History  Diagnosis Date  . Arthritis   . Blood transfusion 2009  . Hypertension   . Seizures ([place]) 1980's    from medication reaction/Pt.  [name] Hx of radiation therapy 09/11/08 -10/31/08    left breast  . History of radiation therapy eot 07/30/12    lumbar spine L4 /l shoulder  . Cough 10/10/2014  . Breast CA ([place]) 08/2008    (LT) breast ca dx 10/09/Chemo  . Lymphoma ([place]) 09/09/2011    NHL  . Leukemia, acute, in remission ([place]) 2012    Pt. not sure  of type  . Metastasis to bone ([place]) 07/03/2012    MRI L spine  . Clicking tinnitus of left ear 01/02/2015  . Diarrhea 02/06/2015  . Pneumonia due to other specified infectious organisms 04/03/2015  . Hypokalemia 04/17/2015  . Right leg DVT ([place]) 05/29/2015  . S/P radiation therapy 06/05/15-06/18/15     right neck 25Gy/69fx   Social History   Social History  . Marital Status: Widowed    Spouse Name: N/A  . Number of Children: 3  . Years of Education: N/A   Social History Main Topics  . Smoking status: Never Smoker   . Smokeless tobacco: Never Used  . Alcohol Use: No  . Drug Use: No  . Sexual Activity: Not Currently   Other Topics Concern  . None   Social History Narrative   The patient is widowed.   Patient had 2 sons 1 daughter. The sons are in [place] and [place], [place].    The daughter lives in [place].   The patient is a nonsmoker, nondrinker, has never used smokeless tobacco products.   Family History  Problem Relation Age of Onset  . Alcohol abuse Other   . Drug abuse Other   . Breast cancer Mother   . Prostate cancer Father   . Cancer Father     colon  . Colon cancer Brother   . Esophageal cancer Neg Hx   . Rectal cancer Neg Hx   . Stomach cancer Neg Hx    Scheduled Meds: . enoxaparin (LOVENOX) injection  40 mg Subcutaneous Q24H  . feeding supplement (ENSURE ENLIVE)  237 mL Oral BID BM  . furosemide  20 mg Intravenous BID  . potassium chloride  10 mEq Intravenous Q1 Hr x 2  . potassium chloride  60 mEq Oral Q6H  . predniSONE  10 mg Oral Q breakfast  . sodium chloride  3 mL Intravenous Q12H   Continuous Infusions:  PRN Meds:.sodium chloride, acetaminophen **OR** acetaminophen, HYDROcodone-acetaminophen, morphine injection, ondansetron **OR** ondansetron (ZOFRAN) IV, senna-docusate, sodium chloride, zolpidem Medications Prior to Admission:  Prior to Admission medications   Medication Sig Start Date End Date Taking? Authorizing Provider  HYDROcodone-acetaminophen (NORCO) 5-325 MG per tablet Take 1 tablet by mouth every 6 (six) hours as needed for moderate pain. 05/29/15  Yes [name], MD  predniSONE (DELTASONE) 10 MG tablet Take 1 tablet (10 mg total) by mouth daily with breakfast. 09/24/15  Yes [name], MD  predniSONE (DELTASONE) 20  MG tablet Take 3 tablets (60 mg total) by mouth daily. Take on days 2-5 of chemotherapy. Patient not taking: Reported on 09/24/2015 08/13/15   [name], MD  rivaroxaban (XARELTO) 20 MG TABS tablet Take 1 tablet (20 mg total) by mouth daily with supper. Patient not taking: Reported on 09/24/2015 07/03/15   [name], MD   Review of Systems  Constitutional: Positive for activity change, appetite change and fatigue.  HENT: Negative.   Respiratory: Negative.   Cardiovascular: Positive for leg swelling.  Genitourinary:       Urinary incontinence   Neurological: Positive for weakness.    Physical Exam  Constitutional: She is oriented to person, place, and time. She appears well-developed.  frail  HENT:  Head: Normocephalic and atraumatic.  Cardiovascular: Normal rate, regular rhythm and normal heart sounds.   BLE +2 edema  Respiratory: Effort normal and breath sounds normal.  GI: Soft. Bowel sounds are normal.  Neurological: She is alert and oriented to person, place, and time.  Skin:  Skin is warm and dry.    Vital Signs: BP 124/55 mmHg  Pulse 97  Temp(Src) 97.7 F (36.5 C) (Oral)  Resp 17  Ht 5\' 2"  (1.575 m)  Wt 53.978 kg (119 lb)  BMI 21.76 kg/m2  SpO2 97%  SpO2: SpO2: 97 % O2 Device:SpO2: 97 % O2 Flow Rate: .   IO: Intake/output summary: No intake or output data in the 24 hours ending 09/28/15 1304  LBM: Last BM Date: 09/26/15 Baseline Weight: Weight: 53.524 kg (118 lb) Most recent weight: Weight: 53.978 kg (119 lb)      Palliative Assessment/Data:    Additional Data Reviewed:  CBC:    Component Value Date/Time   WBC 9.4 09/27/2015 2150   WBC 10.9* 09/21/2015 0843   WBC 7.3 08/14/2010 1251   HGB 9.5* 09/27/2015 2209   HGB 7.9* 09/21/2015 0843   HGB 12.5 08/14/2010 1251   HCT 28.0* 09/27/2015 2209   HCT 24.8* 09/21/2015 0843   HCT 36.6 08/14/2010 1251   PLT 314 09/27/2015 2150   PLT 347 09/21/2015 0843   PLT 216 08/14/2010 1251   MCV 81.9 09/27/2015  2150   MCV 78.6* 09/21/2015 0843   MCV 84 08/14/2010 1251   NEUTROABS 8.2* 09/27/2015 2150   NEUTROABS 9.5* 09/21/2015 0843   NEUTROABS 4.1 08/14/2010 1251   LYMPHSABS 0.6* 09/27/2015 2150   LYMPHSABS 0.1* 09/21/2015 0843   LYMPHSABS 2.5 08/14/2010 1251   MONOABS 0.6 09/27/2015 2150   MONOABS 1.2* 09/21/2015 0843   EOSABS 0.0 09/27/2015 2150   EOSABS 0.0 09/21/2015 0843   EOSABS 0.2 08/14/2010 1251   BASOSABS 0.0 09/27/2015 2150   BASOSABS 0.0 09/21/2015 0843   BASOSABS 0.0 08/14/2010 1251   Comprehensive Metabolic Panel:    Component Value Date/Time   NA 139 09/28/2015 0400   NA 137 09/21/2015 0843   NA 142 06/01/2012 1008   K 2.9* 09/28/2015 0400   K 3.2* 09/21/2015 0843   K 3.8 06/01/2012 1008   CL 104 09/28/2015 0400   CL 107 04/22/2013 0922   CL 103 06/01/2012 1008   CO2 23 09/28/2015 0400   CO2 29 09/21/2015 0843   CO2 25 06/01/2012 1008   BUN 25* 09/28/2015 0400   BUN 4.3* 09/21/2015 0843   BUN 9 06/01/2012 1008   CREATININE 0.90 09/28/2015 0400   CREATININE 0.6 09/21/2015 0843   CREATININE 0.9 06/01/2012 1008   GLUCOSE 94 09/28/2015 0400   GLUCOSE 96 09/21/2015 0843   GLUCOSE 89 04/22/2013 0922   GLUCOSE 108 06/01/2012 1008   CALCIUM 8.9 09/28/2015 0400   CALCIUM 9.2 09/21/2015 0843   CALCIUM 9.8 06/01/2012 1008   AST 20 09/21/2015 0843   AST 23 06/13/2014 0819   AST 19 08/14/2010 1251   ALT 11 09/21/2015 0843   ALT 13 06/13/2014 0819   ALT 9* 08/14/2010 1251   ALKPHOS 87 09/21/2015 0843   ALKPHOS 89 06/13/2014 0819   ALKPHOS 78 08/14/2010 1251   BILITOT 0.42 09/21/2015 0843   BILITOT 0.2* 06/13/2014 0819   BILITOT 0.40 08/14/2010 1251   PROT 5.7* 09/21/2015 0843   PROT 7.5 06/13/2014 0819   PROT 8.1 08/14/2010 1251   ALBUMIN 2.7* 09/21/2015 0843   ALBUMIN 3.9 06/13/2014 0819   ALBUMIN 3.4 08/14/2010 1251   Discussed with Dr Hartford Poli  Time In: 1400 Time Out: 1315 Time Total: 75 min Greater than 50%  of this time was spent counseling and  coordinating care related to the above assessment and plan.  Signed by: Wadie Lessen, NP  Knox Royalty, NP  09/28/2015, 1:04 PM  Please contact Palliative Medicine Team phone at [phone] for questions and concerns.

## 2015-09-28 NOTE — Progress Notes (Signed)
[facility] PROGRESS NOTE   Helen [name] [id number] DOB: [birth year] DOA: 09/27/2015 PCP: Alvy Bimler NI, MD  HPI/Subjective: Feels better, still has significant lower extremity edema  Assessment/Plan: Active Problems:   Failure to thrive (0-17)   DNR (do not resuscitate) discussion   This is no chart notes, patient seen earlier today by my colleague Dr. Laren Everts. Lower extremity edema started on Lasix, significant hypokalemia replete with oral and parenteral supplements. Patient has failure to thrive secondary to metastatic cancer, palliative and hospice medicine consulted.  Code Status: Full Code Family Communication: Plan discussed with the patient. Disposition Plan: Remains inpatient Diet: Diet 2 gram sodium Room service appropriate?: Yes; Fluid consistency:: Thin; Fluid restriction:: 1500 mL Fluid  Consultants: Hospice and palliative medicine   Procedures:  None  Antibiotics:  None   Objective: Filed Vitals:   09/28/15 0657 09/28/15 1310  BP: 124/55 107/53  Pulse: 97 77  Temp: 97.7 F (36.5 C) 97.3 F (36.3 C)  Resp: 17 16    Intake/Output Summary (Last 24 hours) at 09/28/15 1330 Last data filed at 09/28/15 1131  Gross per 24 hour  Intake    240 ml  Output      0 ml  Net    240 ml   Filed Weights   09/27/15 2248 09/28/15 0205  Weight: 53.524 kg (118 lb) 53.978 kg (119 lb)    Exam: General: Alert and awake, oriented x3, not in any acute distress. HEENT: anicteric sclera, pupils reactive to light and accommodation, EOMI CVS: S1-S2 clear, no murmur rubs or gallops Chest: clear to auscultation bilaterally, no wheezing, rales or rhonchi Abdomen: soft nontender, nondistended, normal bowel sounds, no organomegaly Extremities: no cyanosis, clubbing or edema noted bilaterally Neuro: Cranial nerves II-XII intact, no focal neurological deficits  Data Reviewed: Basic Metabolic Panel:  Recent Labs Lab 09/27/15 2209 09/28/15 0400  NA 137 139    K 2.8* 2.9*  CL 101 104  CO2  --  23  GLUCOSE 128* 94  BUN 23* 25*  CREATININE 0.90 0.90  CALCIUM  --  8.9   Liver Function Tests: No results for input(s): AST, ALT, ALKPHOS, BILITOT, PROT, ALBUMIN in the last 168 hours. No results for input(s): LIPASE, AMYLASE in the last 168 hours. No results for input(s): AMMONIA in the last 168 hours. CBC:  Recent Labs Lab 09/27/15 2150 09/27/15 2209  WBC 9.4  --   NEUTROABS 8.2*  --   HGB 9.1* 9.5*  HCT 28.9* 28.0*  MCV 81.9  --   PLT 314  --    Cardiac Enzymes: No results for input(s): CKTOTAL, CKMB, CKMBINDEX, TROPONINI in the last 168 hours. BNP (last 3 results)  Recent Labs  09/27/15 2150  BNP 64.6    ProBNP (last 3 results) No results for input(s): PROBNP in the last 8760 hours.  CBG: No results for input(s): GLUCAP in the last 168 hours.  Micro No results found for this or any previous visit (from the past 240 hour(s)).   Studies: No results found.  Scheduled Meds: . enoxaparin (LOVENOX) injection  40 mg Subcutaneous Q24H  . feeding supplement (ENSURE ENLIVE)  237 mL Oral BID BM  . furosemide  20 mg Intravenous BID  . potassium chloride  10 mEq Intravenous Q1 Hr x 2  . potassium chloride  60 mEq Oral Q6H  . predniSONE  10 mg Oral Q breakfast  . sodium chloride  3 mL Intravenous Q12H   Continuous Infusions:      Time spent:  35 minutes    [facility]  [facility] Pager [phone] If 7PM-7AM, please contact night-coverage at [email or web address], password [facility] 09/28/2015, 1:30 PM  LOS: 1 day

## 2015-09-28 NOTE — H&P (Signed)
[facility]                                                                                    Patient Demographics  Helen [name], is a 80 y.o. female  CSN: [id number]  MRN: [id number]  DOB - September 24, 1935  Admit Date - 09/27/2015  Outpatient Primary MD for the patient is [facility] [place], [place], MD   With History of -  Past Medical History  Diagnosis Date  . Arthritis   . Blood transfusion 2009  . Hypertension   . Seizures ([place]) 1980's    from medication reaction/Pt.  [name] Hx of radiation therapy 09/11/08 -10/31/08    left breast  . History of radiation therapy eot 07/30/12    lumbar spine L4 /l shoulder  . Cough 10/10/2014  . Breast CA ([place]) 08/2008    (LT) breast ca dx 10/09/Chemo  . Lymphoma ([place]) 09/09/2011    NHL  . Leukemia, acute, in remission ([place]) 2012    Pt. not sure of type  . Metastasis to bone ([place]) 07/03/2012    MRI L spine  . Clicking tinnitus of left ear 01/02/2015  . Diarrhea 02/06/2015  . Pneumonia due to other specified infectious organisms 04/03/2015  . Hypokalemia 04/17/2015  . Right leg DVT ([place]) 05/29/2015  . S/P radiation therapy 06/05/15-06/18/15    right neck 25Gy/24fx      Past Surgical History  Procedure Laterality Date  . Mastectomy    . Thyroidectomy  1965  . Appendectomy    . Tonsillectomy      in for   Chief Complaint  Patient presents with  . Leg Swelling  . Cancer  . Weakness     HPI  Helen [name]  is a 80 y.o. female, with past medical history significant for metastatic breast cancer, lymphoma, end-stage, who was scheduled to have hospice started next week in her independent living facility, presenting with generalized weakness, lower extremity swelling and falls the last 2 days, which got worse while having pains given dinner tonight at her daughter's house . I reviewed her chart from the clinic and the patient seems to have been informed about her prognosis by her oncologist. The patient denies any chest pains, shortness of  breath or palpitations. Her main concern was swelling in her lower extremities and was wondering if we can give her a pill that will help her with that. At the same time the patient lives alone in her facility and hospice is not going to be able to start until Monday . Patient had to stop xarelto 2 days ago because of gum bleeding, and is now taken prednisone in addition to pain medication only. She noted the swelling getting worse in the last 2 days.    Review of Systems    In addition to the HPI above,  No Fever-chills, No Headache, No changes with Vision or hearing, No problems swallowing food or Liquids, No Chest pain, Cough or Shortness of Breath, No Abdominal pain, No Nausea or Vommitting, Bowel movements are regular, No Blood in stool or Urine, No dysuria, No new skin rashes or bruises, No new joints pains-aches,  No new weakness, tingling,  numbness in any extremity, No recent weight gain or loss, No polyuria, polydypsia or polyphagia, No significant Mental Stressors.  A full 10 point Review of Systems was done, except as stated above, all other Review of Systems were negative.   Social History Social History  Substance Use Topics  . Smoking status: Never Smoker   . Smokeless tobacco: Never Used  . Alcohol Use: No     Family History Family History  Problem Relation Age of Onset  . Alcohol abuse Other   . Drug abuse Other   . Breast cancer Mother   . Prostate cancer Father   . Cancer Father     colon  . Colon cancer Brother   . Esophageal cancer Neg Hx   . Rectal cancer Neg Hx   . Stomach cancer Neg Hx      Prior to Admission medications   Medication Sig Start Date End Date Taking? Authorizing Provider  HYDROcodone-acetaminophen (NORCO) 5-325 MG per tablet Take 1 tablet by mouth every 6 (six) hours as needed for moderate pain. 05/29/15  Yes [name], MD  predniSONE (DELTASONE) 10 MG tablet Take 1 tablet (10 mg total) by mouth daily with breakfast. 09/24/15   Yes [name], MD  predniSONE (DELTASONE) 20 MG tablet Take 3 tablets (60 mg total) by mouth daily. Take on days 2-5 of chemotherapy. Patient not taking: Reported on 09/24/2015 08/13/15   [name], MD  rivaroxaban (XARELTO) 20 MG TABS tablet Take 1 tablet (20 mg total) by mouth daily with supper. Patient not taking: Reported on 09/24/2015 07/03/15   [name], MD    Allergies  Allergen Reactions  . Codeine Other (See Comments)    Reaction: seizures  . Penicillins Other (See Comments)    Reaction: Seizures Has patient had a PCN reaction causing immediate rash, facial/tongue/throat swelling, SOB or lightheadedness with hypotension: Has patient had a PCN reaction that required hospitalization Yes Has patient had a PCN reaction occurring within the last 10 years: No If all of the above answers are "NO", then may proceed with Cephalosporin use.   . Sulfonamide Derivatives Itching  . Tape Rash  . Tramadol Hcl Rash    Physical Exam  Vitals  Blood pressure 122/67, pulse 91, temperature 98.6 F (37 C), temperature source Oral, resp. rate 23, height 5\' 2"  (1.575 m), weight 53.524 kg (118 lb), SpO2 98 %.   1. General very pleasant female, looks chronically ill  2. Not Suicidal or Homicidal, Awake Alert, pleasantly confused.  3. No F.N deficits, grossly   4. Ears and Eyes appear Normal, Conjunctivae clear, PERRLA. Moist Oral Mucosa. Left lower jaw status post dental extraction  5. Supple Neck, No JVD, No cervical lymphadenopathy appriciated, No Carotid Bruits.  6. Symmetrical Chest wall movement, Good air movement bilaterally, CTAB.  7. RRR, No Gallops, Rubs or Murmurs, No Parasternal Heave.  8. Positive Bowel Sounds, Abdomen Soft, Non tender, No organomegaly appriciated,No rebound -guarding or rigidity.  9.  No Cyanosis, Normal Skin Turgor, No Skin Rash or Bruise.  10. Good muscle tone,  joints appear normal , lower extremity edema extending from the hips down  bilaterally.    Data Review  CBC  Recent Labs Lab 09/21/15 0843 09/27/15 2150 09/27/15 2209  WBC 10.9* 9.4  --   HGB 7.9* 9.1* 9.5*  HCT 24.8* 28.9* 28.0*  PLT 347 314  --   MCV 78.6* 81.9  --   MCH 25.0* 25.8*  --   MCHC 31.8 31.5  --  RDW 21.8* 18.9*  --   LYMPHSABS 0.1* 0.6*  --   MONOABS 1.2* 0.6  --   EOSABS 0.0 0.0  --   BASOSABS 0.0 0.0  --    ------------------------------------------------------------------------------------------------------------------  Chemistries   Recent Labs Lab 09/21/15 0843 09/27/15 2209  NA 137 137  K 3.2* 2.8*  CL  --  101  CO2 29  --   GLUCOSE 96 128*  BUN 4.3* 23*  CREATININE 0.6 0.90  CALCIUM 9.2  --   AST 20  --   ALT 11  --   ALKPHOS 87  --   BILITOT 0.42  --    ------------------------------------------------------------------------------------------------------------------ estimated creatinine clearance is 40.7 mL/min (by C-G formula based on Cr of 0.9). ------------------------------------------------------------------------------------------------------------------ No results for input(s): TSH, T4TOTAL, T3FREE, THYROIDAB in the last 72 hours.  Invalid input(s): FREET3   Coagulation profile No results for input(s): INR, PROTIME in the last 168 hours. ------------------------------------------------------------------------------------------------------------------- No results for input(s): DDIMER in the last 72 hours. -------------------------------------------------------------------------------------------------------------------  Cardiac Enzymes No results for input(s): CKMB, TROPONINI, MYOGLOBIN in the last 168 hours.  Invalid input(s): CK ------------------------------------------------------------------------------------------------------------------ Invalid input(s):  POCBNP   ---------------------------------------------------------------------------------------------------------------  Urinalysis    Component Value Date/Time   COLORURINE YELLOW 09/27/2015 2311   APPEARANCEUR CLEAR 09/27/2015 2311   LABSPEC 1.005 09/27/2015 2311   LABSPEC 1.005 10/07/2012 1005   PHURINE 6.0 09/27/2015 2311   PHURINE 6.5 10/07/2012 1005   GLUCOSEU NEGATIVE 09/27/2015 2311   GLUCOSEU NEGATIVE 06/08/2009 1005   HGBUR NEGATIVE 09/27/2015 2311   HGBUR Negative 10/07/2012 [place] 09/27/2015 2311   BILIRUBINUR Negative 10/07/2012 1005   KETONESUR NEGATIVE 09/27/2015 2311   KETONESUR Negative 10/07/2012 [place] 09/27/2015 2311   PROTEINUR Negative 10/07/2012 1005   UROBILINOGEN 0.2 06/08/2009 1005   NITRITE NEGATIVE 09/27/2015 2311   NITRITE Negative 10/07/2012 1005   LEUKOCYTESUR NEGATIVE 09/27/2015 2311   LEUKOCYTESUR Negative 10/07/2012 1005    ----------------------------------------------------------------------------------------------------------------  Imaging results:   Ct Chest W Contrast  09/21/2015  CLINICAL DATA:  Subsequent encounter for lymphoma with concurrent breast cancer. EXAM: CT CHEST, ABDOMEN, AND PELVIS WITH CONTRAST TECHNIQUE: Multidetector CT imaging of the chest, abdomen and pelvis was performed following the standard protocol during bolus administration of intravenous contrast. CONTRAST:  138mL OMNIPAQUE IOHEXOL 300 MG/ML  SOLN COMPARISON:  07/02/2015. FINDINGS: CT CHEST FINDINGS Mediastinum/Lymph Nodes: Right Port-A-Cath tip is at the SVC/ RA junction. There has been marked interval decrease in right axillary lymphadenopathy. 3.3 cm short axis right axillary lymph node measured previously is now 1.2 cm. Anterior right axillary lymph node which was 2.6 cm (my measurement) on the previous study is now 1.1 cm. 11 mm right paratracheal lymph node measured previously has resolved completely. 2.1 cm necrotic  subcarinal lymph node is new in the interval. 13 mm short axis left hilar lymph node was 9 mm previously. Heart size is normal. No pericardial effusion. Lungs/Pleura: Left apical bronchiectasis and scarring is unchanged. There is new airspace disease in the right apex, likely secondary to previous radiation treatment of lymphadenopathy in the right neck the masslike area of airspace consolidation in the left base has decreased, measuring 3.7 x 2.6 cm today compared to 6.0 x 3.8 cm previously. There is right base collapse/ consolidation. Small right pleural effusion has progressed in the interval. Musculoskeletal: Previously described lucent lesion in the right humeral neck is stable. CT ABDOMEN PELVIS FINDINGS Hepatobiliary: Metastatic lesion in the dome of the liver measures 2.8 x 2.6  cm today compared to 2.6 x 2.1 cm previously. Index lesion measured previously in the inferior right liver had a bilobed appearance. The more medial moiety of this lesion has decreased substantially in size in the interval while the more lateral portion of this lesion is unchanged. Overall measurements today are 3.7 x 3.9 cm compared to 3.8 x 5.5 cm previously. Index lesion measured in the lateral segment left liver previously at 2.9 x 1.6 cm now measures 2.7 x 1.8 cm. On today's exam, there is a 7 mm lesion in the medial segment left liver (image 52 series 2) and this same lesion was 1.8 cm on the previous study. Large stone again identified in the gallbladder. Mild biliary duct prominence is stable. Pancreas: No focal mass lesion. No dilatation of the main duct. No intraparenchymal cyst. No peripancreatic edema. Spleen: 11 mm lesion in the spleen on the previous study has resolved in the interval, but there are multiple new and enlarging splenic lesions. 1.5 cm lesion in the central spleen (image 43 series 2) on today's study was 4 mm previously. 11 mm lesion in the anterior spleen (image 45) is new in the interval. Adrenals/Urinary  Tract: No adrenal nodule or mass. Multiple cysts seen in the kidneys bilaterally are stable. No evidence for hydroureter. The urinary bladder appears normal for the degree of distention. Stomach/Bowel: Stomach is nondistended. No gastric wall thickening. No evidence of outlet obstruction. Duodenum is normally positioned as is the ligament of Treitz. No small bowel wall thickening. No small bowel dilatation. The terminal ileum is normal. No gross colonic mass. No colonic wall thickening. No substantial diverticular change. Vascular/Lymphatic: 10 mm left retrocrural lymph node has progressed in the interval. No gastrohepatic or hepatoduodenal ligament lymphadenopathy. No para-aortic lymphadenopathy. 11 mm short axis right pelvic sidewall lymph node measured previously has progressed at 21 mm. The 16 mm left pelvic sidewall lymph nodes seen previously is now 25 mm. Bilateral inguinal lymphadenopathy has progressed as well. The 11 mm index lymph node in the right inguinal region measured previously is now 28 mm. Reproductive: Fibroid change noted in the uterus. There is no adnexal mass. Other: No intraperitoneal free fluid. Musculoskeletal: 2.7 x 1.8 cm right iliac lesion on the previous study is not substantially changed measuring 3.1 x 1.9 cm. Other areas of metastatic involvement in the bony pelvis are again noted. IMPRESSION: 1. Mixed interval response to therapy. 2. Right subpectoral and axillary lymphadenopathy shows substantial interval decrease in size. Upper mediastinal lymphadenopathy has resolved. However, new mediastinal lymphadenopathy is now evident and pelvic lymphadenopathy shows interval progression. 3. Multiple liver metastases, some of which are improved and others of which are stable. 4. Index splenic lesion seen on the previous study has resolved but new splenic lesions are evident on today's exam. 5. No substantial change in previously identified bony lesions. 6. Cholelithiasis. Electronically  Signed   By: Misty Stanley M.D.   On: 09/21/2015 10:36   Ct Abdomen Pelvis W Contrast  09/21/2015  CLINICAL DATA:  Subsequent encounter for lymphoma with concurrent breast cancer. EXAM: CT CHEST, ABDOMEN, AND PELVIS WITH CONTRAST TECHNIQUE: Multidetector CT imaging of the chest, abdomen and pelvis was performed following the standard protocol during bolus administration of intravenous contrast. CONTRAST:  134mL OMNIPAQUE IOHEXOL 300 MG/ML  SOLN COMPARISON:  07/02/2015. FINDINGS: CT CHEST FINDINGS Mediastinum/Lymph Nodes: Right Port-A-Cath tip is at the SVC/ RA junction. There has been marked interval decrease in right axillary lymphadenopathy. 3.3 cm short axis right axillary lymph node measured  previously is now 1.2 cm. Anterior right axillary lymph node which was 2.6 cm (my measurement) on the previous study is now 1.1 cm. 11 mm right paratracheal lymph node measured previously has resolved completely. 2.1 cm necrotic subcarinal lymph node is new in the interval. 13 mm short axis left hilar lymph node was 9 mm previously. Heart size is normal. No pericardial effusion. Lungs/Pleura: Left apical bronchiectasis and scarring is unchanged. There is new airspace disease in the right apex, likely secondary to previous radiation treatment of lymphadenopathy in the right neck the masslike area of airspace consolidation in the left base has decreased, measuring 3.7 x 2.6 cm today compared to 6.0 x 3.8 cm previously. There is right base collapse/ consolidation. Small right pleural effusion has progressed in the interval. Musculoskeletal: Previously described lucent lesion in the right humeral neck is stable. CT ABDOMEN PELVIS FINDINGS Hepatobiliary: Metastatic lesion in the dome of the liver measures 2.8 x 2.6 cm today compared to 2.6 x 2.1 cm previously. Index lesion measured previously in the inferior right liver had a bilobed appearance. The more medial moiety of this lesion has decreased substantially in size in the  interval while the more lateral portion of this lesion is unchanged. Overall measurements today are 3.7 x 3.9 cm compared to 3.8 x 5.5 cm previously. Index lesion measured in the lateral segment left liver previously at 2.9 x 1.6 cm now measures 2.7 x 1.8 cm. On today's exam, there is a 7 mm lesion in the medial segment left liver (image 52 series 2) and this same lesion was 1.8 cm on the previous study. Large stone again identified in the gallbladder. Mild biliary duct prominence is stable. Pancreas: No focal mass lesion. No dilatation of the main duct. No intraparenchymal cyst. No peripancreatic edema. Spleen: 11 mm lesion in the spleen on the previous study has resolved in the interval, but there are multiple new and enlarging splenic lesions. 1.5 cm lesion in the central spleen (image 43 series 2) on today's study was 4 mm previously. 11 mm lesion in the anterior spleen (image 45) is new in the interval. Adrenals/Urinary Tract: No adrenal nodule or mass. Multiple cysts seen in the kidneys bilaterally are stable. No evidence for hydroureter. The urinary bladder appears normal for the degree of distention. Stomach/Bowel: Stomach is nondistended. No gastric wall thickening. No evidence of outlet obstruction. Duodenum is normally positioned as is the ligament of Treitz. No small bowel wall thickening. No small bowel dilatation. The terminal ileum is normal. No gross colonic mass. No colonic wall thickening. No substantial diverticular change. Vascular/Lymphatic: 10 mm left retrocrural lymph node has progressed in the interval. No gastrohepatic or hepatoduodenal ligament lymphadenopathy. No para-aortic lymphadenopathy. 11 mm short axis right pelvic sidewall lymph node measured previously has progressed at 21 mm. The 16 mm left pelvic sidewall lymph nodes seen previously is now 25 mm. Bilateral inguinal lymphadenopathy has progressed as well. The 11 mm index lymph node in the right inguinal region measured previously  is now 28 mm. Reproductive: Fibroid change noted in the uterus. There is no adnexal mass. Other: No intraperitoneal free fluid. Musculoskeletal: 2.7 x 1.8 cm right iliac lesion on the previous study is not substantially changed measuring 3.1 x 1.9 cm. Other areas of metastatic involvement in the bony pelvis are again noted. IMPRESSION: 1. Mixed interval response to therapy. 2. Right subpectoral and axillary lymphadenopathy shows substantial interval decrease in size. Upper mediastinal lymphadenopathy has resolved. However, new mediastinal lymphadenopathy is now evident  and pelvic lymphadenopathy shows interval progression. 3. Multiple liver metastases, some of which are improved and others of which are stable. 4. Index splenic lesion seen on the previous study has resolved but new splenic lesions are evident on today's exam. 5. No substantial change in previously identified bony lesions. 6. Cholelithiasis. Electronically Signed   By: Misty Stanley M.D.   On: 09/21/2015 10:36    Assessment & Plan  This unfortunate lady with history of advanced metastatic breast cancer, and lymphoma presenting with worsening weakness and lower extremity edema. I discussed CODE STATUS with her after reviewing the charts and she wanted to be full code for now. She wants some pills to help her diurese 1. Lower extremity edema 2 failure to thrive with progressive weakness 3. Advanced metastatic cancer  Plan Start Lasix low-dose Consult palliative care Lower extremity Dopplers to rule out DVT   DVT Prophylaxis Lovenox  AM Labs Ordered, also please review Full Orders  Code Status full  Disposition Plan: Home with hospice  Time spent in minutes : 32 minutes  Condition GUARDED   @SIGNATURE @

## 2015-09-28 NOTE — Progress Notes (Signed)
VASCULAR LAB PRELIMINARY  PRELIMINARY  PRELIMINARY  PRELIMINARY  Bilateral lower extremity venous duplex completed.    Preliminary report: Bilateral:  No evidence of DVT, superficial thrombosis, or Baker's Cyst. Mild to moderate interstitial fluid noted bilaterally greater on the right. Enlarge ment of the inguinal lymph nodes noted bilaterally greater on the right   Remmi Armenteros, RVS 09/28/2015, 1:03 PM

## 2015-09-29 DIAGNOSIS — N179 Acute kidney failure, unspecified: Secondary | ICD-10-CM | POA: Diagnosis present

## 2015-09-29 LAB — RENAL FUNCTION PANEL
ANION GAP: 9 (ref 5–15)
Albumin: 3.1 g/dL — ABNORMAL LOW (ref 3.5–5.0)
BUN: 29 mg/dL — ABNORMAL HIGH (ref 6–20)
CHLORIDE: 103 mmol/L (ref 101–111)
CO2: 25 mmol/L (ref 22–32)
Calcium: 9.6 mg/dL (ref 8.9–10.3)
Creatinine, Ser: 1.25 mg/dL — ABNORMAL HIGH (ref 0.44–1.00)
GFR, EST AFRICAN AMERICAN: 46 mL/min — AB (ref >60)
GFR, EST NON AFRICAN AMERICAN: 40 mL/min — AB (ref >60)
Glucose, Bld: 105 mg/dL — ABNORMAL HIGH (ref 65–99)
POTASSIUM: 4.9 mmol/L (ref 3.5–5.1)
Phosphorus: 3.1 mg/dL (ref 2.5–4.6)
Sodium: 137 mmol/L (ref 135–145)

## 2015-09-29 LAB — MAGNESIUM: Magnesium: 2.4 mg/dL (ref 1.7–2.4)

## 2015-09-29 LAB — CBC
HEMATOCRIT: 27.7 % — AB (ref 36.0–46.0)
Hemoglobin: 8.8 g/dL — ABNORMAL LOW (ref 12.0–15.0)
MCH: 25.7 pg — ABNORMAL LOW (ref 26.0–34.0)
MCHC: 31.8 g/dL (ref 30.0–36.0)
MCV: 80.8 fL (ref 78.0–100.0)
PLATELETS: 286 10*3/uL (ref 150–400)
RBC: 3.43 MIL/uL — AB (ref 3.87–5.11)
RDW: 19.2 % — ABNORMAL HIGH (ref 11.5–15.5)
WBC: 7.9 10*3/uL (ref 4.0–10.5)

## 2015-09-29 NOTE — NC FL2 (Signed)
[place] LEVEL OF CARE SCREENING TOOL     IDENTIFICATION  Patient Name: Helen [name] Birthdate: [birth year] Sex: female Admission Date (Current Location): 09/27/2015  [place] and [place] Number:   [facility] and Address:  [facility],  [place] [address], [place]      Provider Number: [id number]  Attending Physician Name and Address:  Verlee Monte, MD  Relative Name and Phone Number:       Current Level of Care: Hospital Recommended Level of Care: [place] Prior Approval Number:    Date Approved/Denied:   PASRR Number: [id number]  Discharge Plan: SNF    Current Diagnoses: Patient Active Problem List   Diagnosis Date Noted  . Acute kidney injury ([place]) 09/29/2015  . Failure to thrive (0-17) 09/28/2015  . DNR (do not resuscitate) discussion 09/28/2015  . Leg swelling   . Metastatic cancer ([place])   . Skin lesions 09/24/2015  . Palliative care encounter 09/24/2015  . Protein calorie malnutrition ([place]) 08/13/2015  . Leukocytosis 08/06/2015  . Mucositis due to chemotherapy 07/30/2015  . Bilateral leg edema 06/18/2015  . Acute thrombosis of right internal jugular vein ([place]) 05/29/2015  . Metastasis to supraclavicular lymph node ([place]) 05/09/2015  . Hypokalemia 04/17/2015  . Pneumonia due to other specified infectious organisms 04/03/2015  . Diarrhea due to drug 02/06/2015  . Thrombocytopenia due to drugs 02/06/2015  . Clicking tinnitus of left ear 01/02/2015  . Malignant neoplasm metastatic to both lungs ([place]) 01/02/2015  . Cough 10/10/2014  . Skin rash 10/10/2014  . Poor dentition 08/08/2014  . Pulmonary metastases ([place]) 08/08/2014  . Liver metastasis ([place]) 08/08/2014  . Pancytopenia due to antineoplastic chemotherapy ([place]) 05/09/2014  . Chest wall tenderness 05/09/2014  . Leukopenia due to antineoplastic chemotherapy 05/09/2014  . History of ongoing treatment with hormonal therapy 01/03/2014  . Hx  of radiation therapy   . Metastasis to bone ([place]) 07/08/2012  . Other abnormal glucose 02/10/2012  . Osteopenia 02/10/2012  . Pure hypercholesterolemia 02/10/2012  . Routine general medical examination at a health care facility 02/10/2012  . Lymphoma, small lymphocytic ([place]) 09/09/2011  . Postsurgical hypothyroidism 11/18/2010  . ALLERGIC RHINITIS 09/16/2010  . DEGENERATIVE JOINT DISEASE, BOTH KNEES, SEVERE 05/24/2009  . ASTEATOTIC ECZEMA 04/09/2009  . Cancer of left breast ([place]) 08/03/2008    Orientation ACTIVITIES/SOCIAL BLADDER RESPIRATION    Self, Time, Situation, Place  Active Incontinent Normal  BEHAVIORAL SYMPTOMS/MOOD NEUROLOGICAL BOWEL NUTRITION STATUS      Continent Diet (Diet 2 gram sodium, Fluid consistency:: Thin; Fluid restriction:: 1500 mL Fluid )  PHYSICIAN VISITS COMMUNICATION OF NEEDS Height & Weight Skin    Verbally 5\' 2"  (157.5 cm) 119 lbs. Normal          AMBULATORY STATUS RESPIRATION    Assist extensive Normal      Personal Care Assistance Level of Assistance  Bathing, Dressing, Feeding Bathing Assistance: Maximum assistance Feeding assistance: Independent Dressing Assistance: Maximum assistance      Functional Limitations Info  Sight, Hearing, Speech Sight Info: Adequate Hearing Info: Adequate Speech Info: Adequate       SPECIAL CARE FACTORS FREQUENCY  PT (By licensed PT), OT (By licensed OT)     PT Frequency: 5 x a week OT Frequency: 5 x a week           Additional Factors Info  Code Status, Allergies Code Status Info: DNR code status. Allergies Info: Codeine, Penicillins, Sulfonamide Derivatives, Tape, Tramadol Hcl  Current Medications (09/29/2015): Current Facility-Administered Medications  Medication Dose Route Frequency Provider Last Rate Last Dose  . 0.9 %  sodium chloride infusion  250 mL Intravenous PRN Merton Border, MD      . acetaminophen (TYLENOL) tablet 650 mg  650 mg Oral Q6H PRN Merton Border, MD       Or   . acetaminophen (TYLENOL) suppository 650 mg  650 mg Rectal Q6H PRN Merton Border, MD      . enoxaparin (LOVENOX) injection 40 mg  40 mg Subcutaneous Q24H Merton Border, MD   40 mg at 09/29/15 1138  . feeding supplement (ENSURE ENLIVE) (ENSURE ENLIVE) liquid 237 mL  237 mL Oral BID BM Merton Border, MD   237 mL at 09/29/15 1138  . HYDROcodone-acetaminophen (NORCO/VICODIN) 5-325 MG per tablet 1 tablet  1 tablet Oral Q6H PRN Merton Border, MD      . morphine 2 MG/ML injection 2 mg  2 mg Intravenous Q4H PRN Merton Border, MD   2 mg at 09/29/15 0644  . ondansetron (ZOFRAN) tablet 4 mg  4 mg Oral Q6H PRN Merton Border, MD       Or  . ondansetron [facility]) injection 4 mg  4 mg Intravenous Q6H PRN Merton Border, MD      . predniSONE (DELTASONE) tablet 10 mg  10 mg Oral Q breakfast Merton Border, MD   10 mg at 09/29/15 1137  . senna-docusate (Senokot-S) tablet 1 tablet  1 tablet Oral QHS PRN Merton Border, MD      . sodium chloride 0.9 % injection 3 mL  3 mL Intravenous Q12H Merton Border, MD   3 mL at 09/28/15 2201  . sodium chloride 0.9 % injection 3 mL  3 mL Intravenous PRN Merton Border, MD      . zolpidem (AMBIEN) tablet 5 mg  5 mg Oral QHS PRN Merton Border, MD       Do not use this list as official medication orders. Please verify with discharge summary.  Discharge Medications:   Medication List    ASK your doctor about these medications        HYDROcodone-acetaminophen 5-325 MG tablet  Commonly known as:  NORCO  Take 1 tablet by mouth every 6 (six) hours as needed for moderate pain.     predniSONE 20 MG tablet  Commonly known as:  DELTASONE  Take 3 tablets (60 mg total) by mouth daily. Take on days 2-5 of chemotherapy.     predniSONE 10 MG tablet  Commonly known as:  DELTASONE  Take 1 tablet (10 mg total) by mouth daily with breakfast.     rivaroxaban 20 MG Tabs tablet  Commonly known as:  XARELTO  Take 1 tablet (20 mg total) by mouth daily with supper.        Relevant Imaging Results:  Relevant Lab  Results:  Recent Labs    Additional Information SSN: [id number]. Pt to have palliative care services to follow at SNF. Pt is no longer receiving any treatment for her cancer.  KIDD, SUZANNA A, LCSW

## 2015-09-29 NOTE — Clinical Social Work Placement (Signed)
   CLINICAL SOCIAL WORK PLACEMENT  NOTE  Date:  09/29/2015  Patient Details  Name: Helen [name] MRN: [id number] Date of Birth: 11-22-1934  Clinical Social Work is seeking post-discharge placement for this patient at the [place] level of care (*CSW will initial, date and re-position this form in  chart as items are completed):  Yes   Patient/family provided with [place] Work Department's list of facilities offering this level of care within the geographic area requested by the patient (or if unable, by the patient's family).  Yes   Patient/family informed of their freedom to choose among providers that offer the needed level of care, that participate in Medicare, Medicaid or managed care program needed by the patient, have an available bed and are willing to accept the patient.  Yes   Patient/family informed of [place]'s ownership interest in [facility] and [facility], as well as of the fact that they are under no obligation to receive care at these facilities.  PASRR submitted to EDS on 09/29/15     PASRR number received on 09/29/15     Existing PASRR number confirmed on       FL2 transmitted to all facilities in geographic area requested by pt/family on 09/29/15     FL2 transmitted to all facilities within larger geographic area on       Patient informed that his/her managed care company has contracts with or will negotiate with certain facilities, including the following:            Patient/family informed of bed offers received.  Patient chooses bed at       Physician recommends and patient chooses bed at      Patient to be transferred to   on  .  Patient to be transferred to facility by       Patient family notified on   of transfer.  Name of family member notified:        PHYSICIAN Please sign FL2, Please sign DNR     Additional Comment:    _______________________________________________ Ladell Pier,  LCSW 09/29/2015, 2:33 PM

## 2015-09-29 NOTE — Progress Notes (Addendum)
[facility] PROGRESS NOTE   Helen [name] [id number] DOB: 01-24-1935 DOA: 09/27/2015 PCP: Alvy Bimler NI, MD  HPI/Subjective: Feels better, still has significant lower extremity edema  Assessment/Plan: Active Problems:   Failure to thrive (0-17)   DNR (do not resuscitate) discussion   Leg swelling   Metastatic cancer ([place])   Acute kidney injury ([place])   Lower extremity swelling Patient has bilateral pitting edema, mild hypoalbuminemia. Bilateral lower extremity Dopplers negative for DVT. Started on low-dose of Lasix, creatinine increase L discontinued. Unclear etiology, could be secondary to lymph vessels blockage by cancer.  Acute kidney injury Patient presented with creatinine of 0.9, started on 20 mg of IV Lasix. Creatinine increased 1.25, Lasix discontinued, will watch off of diuretics.  Hypokalemia Repleted with oral supplements.  Failure to thrive Patient has advanced metastatic breast cancer and lymphoma. Came in with worsening generalized weakness. PT/OT to evaluate and treat, follow clinically.  Metastatic breast cancer Primary is cancer of the left breast, per last note from Dr. Alvy Bimler on 09/24/15, responding to treatment. There is no subcutaneous ischial lesions and clear if it's secondary to cancer or not. Has very poor appetite, profound weakness, recent falls and rapid decline in functional status. Per oncology patient qualifies for palliative care, palliative care consulted. DNR/DNI documented, patient hopeful for improvement and asked to treat the treatable conditions.  Acute thrombosis of the right internal jugular vein On Xarleto secondary to acute thrombosis of the right internal jugular vein from the cancer. Xarelto discontinued (by oncology) because of recent bleeding.  Protein calorie malnutrition Cancer cachexia, dietitian to see.  Code Status: DNR Family Communication: Plan discussed with the patient. Disposition Plan: Remains  inpatient Diet: Diet 2 gram sodium Room service appropriate?: Yes; Fluid consistency:: Thin; Fluid restriction:: 1500 mL Fluid  Consultants: Hospice and palliative medicine   Procedures:  None  Antibiotics:  None   Objective: Filed Vitals:   09/28/15 2141 09/29/15 0607  BP: 131/76 139/61  Pulse: 89 90  Temp: 98.2 F (36.8 C) 97.6 F (36.4 C)  Resp: 18 16    Intake/Output Summary (Last 24 hours) at 09/29/15 1251 Last data filed at 09/28/15 2006  Gross per 24 hour  Intake    460 ml  Output      0 ml  Net    460 ml   Filed Weights   09/27/15 2248 09/28/15 0205  Weight: 53.524 kg (118 lb) 53.978 kg (119 lb)    Exam: General: Alert and awake, oriented x3, not in any acute distress. HEENT: anicteric sclera, pupils reactive to light and accommodation, EOMI CVS: S1-S2 clear, no murmur rubs or gallops Chest: clear to auscultation bilaterally, no wheezing, rales or rhonchi Abdomen: soft nontender, nondistended, normal bowel sounds, no organomegaly Extremities: no cyanosis, clubbing or edema noted bilaterally Neuro: Cranial nerves II-XII intact, no focal neurological deficits  Data Reviewed: Basic Metabolic Panel:  Recent Labs Lab 09/27/15 2209 09/28/15 0400 09/29/15 0625  NA 137 139 137  K 2.8* 2.9* 4.9  CL 101 104 103  CO2  --  23 25  GLUCOSE 128* 94 105*  BUN 23* 25* 29*  CREATININE 0.90 0.90 1.25*  CALCIUM  --  8.9 9.6  MG  --   --  2.4  PHOS  --   --  3.1   Liver Function Tests:  Recent Labs Lab 09/29/15 0625  ALBUMIN 3.1*   No results for input(s): LIPASE, AMYLASE in the last 168 hours. No results for input(s): AMMONIA in the last 168  hours. CBC:  Recent Labs Lab 09/27/15 2150 09/27/15 2209 09/29/15 0625  WBC 9.4  --  7.9  NEUTROABS 8.2*  --   --   HGB 9.1* 9.5* 8.8*  HCT 28.9* 28.0* 27.7*  MCV 81.9  --  80.8  PLT 314  --  286   Cardiac Enzymes: No results for input(s): CKTOTAL, CKMB, CKMBINDEX, TROPONINI in the last 168 hours. BNP  (last 3 results)  Recent Labs  09/27/15 2150  BNP 64.6    ProBNP (last 3 results) No results for input(s): PROBNP in the last 8760 hours.  CBG: No results for input(s): GLUCAP in the last 168 hours.  Micro No results found for this or any previous visit (from the past 240 hour(s)).   Studies: No results found.  Scheduled Meds: . enoxaparin (LOVENOX) injection  40 mg Subcutaneous Q24H  . feeding supplement (ENSURE ENLIVE)  237 mL Oral BID BM  . predniSONE  10 mg Oral Q breakfast  . sodium chloride  3 mL Intravenous Q12H   Continuous Infusions:      Time spent: 35 minutes    [facility] A  [facility] Pager [phone] If 7PM-7AM, please contact night-coverage at [email or web address], password [facility] 09/29/2015, 12:51 PM  LOS: 2 days

## 2015-09-29 NOTE — Clinical Social Work Note (Signed)
Clinical Social Work Assessment  Patient Details  Name: Helen [name] MRN: [id number] Date of Birth: [birth year]  Date of referral:  09/29/15               Reason for consult:  Discharge Planning                Permission sought to share information with:  Family Supports Permission granted to share information::  Yes, Verbal Permission Granted  Name::     Helen [name]  Agency::     Relationship::  son  Contact Information:  [phone]  Housing/Transportation Living arrangements for the past 2 months:  [place] of Information:  Patient, Adult Children Patient Interpreter Needed:  None Criminal Activity/Legal Involvement Pertinent to Current Situation/Hospitalization:  No - Comment as needed Significant Relationships:  Adult Children Lives with:  Self Do you feel safe going back to the place where you live?  No Need for family participation in patient care:  Yes (Comment)  Care giving concerns:  CSW received referral for New SNF.   Social Worker assessment / plan:  CSW received referral for New SNF.   CSW met with pt at bedside. CSW introduced self and explained role. Pt reports that she lives in an independent living apartment. CSW discussed recommendation for rehab at [facility] upon discharge. Pt stated that her understanding was plan for rehab as her needs cannot be managed at [place] apartment at this time. Pt agreeable to [employer]. SNF search. Pt provided permission for CSW to contact pt son to discuss.  CSW contacted pt son, Helen [name] to discuss. Pt son agreeable to rehab at [facility]. Pt son states pt not undergoing any further chemotherapy treatment for cancer. Pt son agreeable to palliative care to follow at SNF. CSW discussed process of SNF search and that CSW will follow up with pt and pt son re: SNF bed offers.  CSW completed FL2 and initiated SNF search to [place] to follow up with pt and pt son re: SNF bed offers.   CSW to  continue to follow to provide support and assist with pt disposition needs.   Employment status:  Retired Forensic scientist:  Medicare PT Recommendations:  [place] / Referral to community resources:  [place]  Patient/Family's Response to care:  Pt alert and oriented x 4. Pt states that she worked with therapy today and feels better sitting up in the chair. Pt and pt son agreeable to plan for SNF for rehab and palliative care to follow upon discharge.  Patient/Family's Understanding of and Emotional Response to Diagnosis, Current Treatment, and Prognosis:  Pt son understanding of pt poor prognosis, but hopeful to see improvement with rehab attempt. Pt son agreeable to palliative care following at SNF to follow pt progress and continue discussion surrounding goals of care.   Emotional Assessment Appearance:  Appears stated age Attitude/Demeanor/Rapport:  Other (pt appropriate) Affect (typically observed):  Appropriate, Accepting Orientation:  Oriented to Self, Oriented to Place, Oriented to  Time, Oriented to Situation Alcohol / Substance use:  Not Applicable Psych involvement (Current and /or in the community):  No (Comment)  Discharge Needs  Concerns to be addressed:  Discharge Planning Concerns Readmission within the last 30 days:  No Current discharge risk:  Lives alone, Physical Impairment Barriers to Discharge:  Continued Medical Work up   Alison Murray A, LCSW 09/29/2015, 2:38 PM  [phone]

## 2015-09-29 NOTE — Evaluation (Signed)
Physical Therapy Evaluation Patient Details Name: Helen [name] MRN: [id number] DOB: April 14, 1935 Today's Date: 09/29/2015   History of Present Illness  Helen [name] is a 80 y.o. female, with past medical history significant for metastatic breast cancer, lymphoma, end-stage, who was scheduled to have hospice started next week in her independent living facility, presenting 09/27/15  with generalized weakness, lower extremity swelling and falls the last 2 days, which got worse  Clinical Impression  Patient is very weak to stand and pivot, poor weight bearing on the legs with 2 person  Assist.  Pt admitted with above diagnosis. Pt currently with functional limitations due to the deficits listed below (see PT Problem List).  Pt will benefit from skilled PT to increase their independence and safety with mobility to allow discharge to the venue listed below.( SNF)     Follow Up Recommendations SNF;Supervision/Assistance - 24 hour    Equipment Recommendations  None recommended by PT    Recommendations for Other Services       Precautions / Restrictions Precautions Precautions: Fall      Mobility  Bed Mobility Overal bed mobility: Needs Assistance Bed Mobility: Supine to Sit     Supine to sit: Mod assist     General bed mobility comments: assist with trunk  for upright position.  Transfers Overall transfer level: Needs assistance Equipment used: 2 person hand held assist Transfers: Sit to/from [facility] Sit to Stand: Max assist;+2 physical assistance;+2 safety/equipment Stand pivot transfers: +2 physical assistance;Max assist;+2 safety/equipment       General transfer comment: patient unable to fully stand from the bed, 2 attempts to pivot to recliner with 2 armhold technique, poor weight bearing on the legs.  Ambulation/Gait                Stairs            Wheelchair Mobility    Modified Rankin (Stroke Patients Only)       Balance  Overall balance assessment: Needs assistance;History of Falls Sitting-balance support: Bilateral upper extremity supported;Feet supported Sitting balance-Leahy Scale: Poor   Postural control: Posterior lean                                   Pertinent Vitals/Pain Pain Assessment: Faces Faces Pain Scale: Hurts little more Pain Location: legs when standing Pain Descriptors / Indicators: Aching Pain Intervention(s): Limited activity within patient's tolerance;Monitored during session    Home Living Family/patient expects to be discharged to:: Hospice/Palliative care Living Arrangements:  (independent living)             Home Equipment: Gilford Rile - 2 wheels      Prior Function Level of Independence: Independent         Comments: until recently     Hand Dominance        Extremity/Trunk Assessment   Upper Extremity Assessment: Generalized weakness           Lower Extremity Assessment: Generalized weakness      Cervical / Trunk Assessment: Kyphotic  Communication   Communication: No difficulties  Cognition Arousal/Alertness: Awake/alert Behavior During Therapy: WFL for tasks assessed/performed Overall Cognitive Status: Within Functional Limits for tasks assessed                      General Comments      Exercises        Assessment/Plan    PT  Assessment Patient needs continued PT services  PT Diagnosis Difficulty walking;Generalized weakness   PT Problem List Decreased strength;Decreased activity tolerance;Decreased balance;Decreased mobility;Decreased knowledge of precautions;Decreased safety awareness;Decreased knowledge of use of DME;Pain  PT Treatment Interventions DME instruction;Gait training;Functional mobility training;Therapeutic activities;Therapeutic exercise;Patient/family education   PT Goals (Current goals can be found in the Care Plan section) Acute Rehab PT Goals Patient Stated Goal: to get my strength PT Goal  Formulation: With patient Time For Goal Achievement: 10/13/15 Potential to Achieve Goals: Fair    Frequency Min 3X/week   Barriers to discharge Decreased caregiver support will need assist 24/7.    Co-evaluation               End of Session Equipment Utilized During Treatment: Gait belt Activity Tolerance: Patient limited by fatigue Patient left: in chair;with call bell/phone within reach;with chair alarm set Nurse Communication: Need for lift equipment;Mobility status         Time: 1209-1225 PT Time Calculation (min) (ACUTE ONLY): 16 min   Charges:   PT Evaluation $Initial PT Evaluation Tier I: 1 Procedure     PT G CodesClaretha Cooper 09/29/2015, 1:42 PM  Tresa Endo PT [phone]

## 2015-09-30 LAB — RENAL FUNCTION PANEL
Albumin: 2.9 g/dL — ABNORMAL LOW (ref 3.5–5.0)
Anion gap: 11 (ref 5–15)
BUN: 37 mg/dL — AB (ref 6–20)
CHLORIDE: 103 mmol/L (ref 101–111)
CO2: 21 mmol/L — AB (ref 22–32)
CREATININE: 2.27 mg/dL — AB (ref 0.44–1.00)
Calcium: 8.7 mg/dL — ABNORMAL LOW (ref 8.9–10.3)
GFR calc non Af Amer: 20 mL/min — ABNORMAL LOW (ref >60)
GFR, EST AFRICAN AMERICAN: 23 mL/min — AB (ref >60)
Glucose, Bld: 91 mg/dL (ref 65–99)
POTASSIUM: 5.8 mmol/L — AB (ref 3.5–5.1)
Phosphorus: 4.1 mg/dL (ref 2.5–4.6)
Sodium: 135 mmol/L (ref 135–145)

## 2015-09-30 MED ORDER — BOOST / RESOURCE BREEZE PO LIQD
1.0000 | Freq: Three times a day (TID) | ORAL | Status: DC
  Administered 2015-10-01 – 2015-10-02 (×4): 1 via ORAL

## 2015-09-30 MED ORDER — SODIUM POLYSTYRENE SULFONATE 15 GM/60ML PO SUSP
15.0000 g | Freq: Once | ORAL | Status: AC
  Administered 2015-09-30: 15 g via ORAL
  Filled 2015-09-30: qty 60

## 2015-09-30 MED ORDER — ENOXAPARIN SODIUM 30 MG/0.3ML ~~LOC~~ SOLN
30.0000 mg | SUBCUTANEOUS | Status: DC
  Administered 2015-10-01 – 2015-10-02 (×2): 30 mg via SUBCUTANEOUS
  Filled 2015-09-30 (×2): qty 0.3

## 2015-09-30 MED ORDER — SODIUM CHLORIDE 0.9 % IV SOLN
INTRAVENOUS | Status: DC
  Administered 2015-09-30 – 2015-10-01 (×3): via INTRAVENOUS

## 2015-09-30 NOTE — Clinical Social Work Note (Addendum)
10:42am- CSW received a return call from patient's son who has tentatively chosen [facility] SNF for STR at time of discharge.  Evette Doffing would like to discuss this decision with his sister (patient's daughter) and will confirm this decision with CSW.  10:40am- CSW attempted to provide bed offers to son, Evette Doffing- message left.  CSW awaiting a return call with bed choice.  Nonnie Done, LCSW [phone]  [facility] Licensed Clinical Social Worker

## 2015-09-30 NOTE — Progress Notes (Signed)
[facility] PROGRESS NOTE   Helen [name] [id number] DOB: Feb 05, 1935 DOA: 09/27/2015 PCP: Alvy Bimler NI, MD  HPI/Subjective: Feels better and stronger was able to get up and walk around yesterday. Eating more today. If renal function improves in the morning she can probably be discharged to SNF.  Assessment/Plan: Active Problems:   Failure to thrive (0-17)   DNR (do not resuscitate) discussion   Leg swelling   Metastatic cancer ([place])   Acute kidney injury ([place])   Lower extremity swelling Patient has bilateral pitting edema, mild hypoalbuminemia. Bilateral lower extremity Dopplers negative for DVT. Started on low-dose of Lasix, creatinine increase L discontinued. Unclear etiology, could be secondary to lymph vessels blockage by cancer.  Acute kidney injury Patient presented with creatinine of 0.9, started on 20 mg of IV Lasix. This is got worse, creatinine is 2.27 today, I have started her on gentle IV fluids. Check renal function in the morning.  Hypokalemia Repleted with oral supplements.  Failure to thrive Patient has advanced metastatic breast cancer and lymphoma. Came in with worsening generalized weakness. PT/OT to evaluate and treat, follow clinically.  Metastatic breast cancer Primary is cancer of the left breast, per last note from Dr. Alvy Bimler on 09/24/15, responding to treatment. There is no subcutaneous ischial lesions and clear if it's secondary to cancer or not. Has very poor appetite, profound weakness, recent falls and rapid decline in functional status. Per oncology patient qualifies for palliative care, palliative care consulted. DNR/DNI documented, patient hopeful for improvement and asked to treat the treatable conditions.  Acute thrombosis of the right internal jugular vein On Xarleto secondary to acute thrombosis of the right internal jugular vein from the cancer. Xarelto discontinued (by oncology) because of recent bleeding.  Protein  calorie malnutrition Cancer cachexia, dietitian to see.  Code Status: DNR Family Communication: Plan discussed with the patient. Disposition Plan: Remains inpatient Diet: Diet 2 gram sodium Room service appropriate?: Yes; Fluid consistency:: Thin; Fluid restriction:: 1500 mL Fluid  Consultants: Hospice and palliative medicine   Procedures:  None  Antibiotics:  None   Objective: Filed Vitals:   09/29/15 2112 09/30/15 0435  BP: 114/61 120/59  Pulse: 97 91  Temp: 98 F (36.7 C) 98.4 F (36.9 C)  Resp: 20 20    Intake/Output Summary (Last 24 hours) at 09/30/15 1051 Last data filed at 09/29/15 1700  Gross per 24 hour  Intake    240 ml  Output      0 ml  Net    240 ml   Filed Weights   09/27/15 2248 09/28/15 0205  Weight: 53.524 kg (118 lb) 53.978 kg (119 lb)    Exam: General: Alert and awake, oriented x3, not in any acute distress. HEENT: anicteric sclera, pupils reactive to light and accommodation, EOMI CVS: S1-S2 clear, no murmur rubs or gallops Chest: clear to auscultation bilaterally, no wheezing, rales or rhonchi Abdomen: soft nontender, nondistended, normal bowel sounds, no organomegaly Extremities: no cyanosis, clubbing or edema noted bilaterally Neuro: Cranial nerves II-XII intact, no focal neurological deficits  Data Reviewed: Basic Metabolic Panel:  Recent Labs Lab 09/27/15 2209 09/28/15 0400 09/29/15 0625 09/30/15 0428  NA 137 139 137 135  K 2.8* 2.9* 4.9 5.8*  CL 101 104 103 103  CO2  --  23 25 21*  GLUCOSE 128* 94 105* 91  BUN 23* 25* 29* 37*  CREATININE 0.90 0.90 1.25* 2.27*  CALCIUM  --  8.9 9.6 8.7*  MG  --   --  2.4  --  PHOS  --   --  3.1 4.1   Liver Function Tests:  Recent Labs Lab 09/29/15 0625 09/30/15 0428  ALBUMIN 3.1* 2.9*   No results for input(s): LIPASE, AMYLASE in the last 168 hours. No results for input(s): AMMONIA in the last 168 hours. CBC:  Recent Labs Lab 09/27/15 2150 09/27/15 2209 09/29/15 0625    WBC 9.4  --  7.9  NEUTROABS 8.2*  --   --   HGB 9.1* 9.5* 8.8*  HCT 28.9* 28.0* 27.7*  MCV 81.9  --  80.8  PLT 314  --  286   Cardiac Enzymes: No results for input(s): CKTOTAL, CKMB, CKMBINDEX, TROPONINI in the last 168 hours. BNP (last 3 results)  Recent Labs  09/27/15 2150  BNP 64.6    ProBNP (last 3 results) No results for input(s): PROBNP in the last 8760 hours.  CBG: No results for input(s): GLUCAP in the last 168 hours.  Micro No results found for this or any previous visit (from the past 240 hour(s)).   Studies: No results found.  Scheduled Meds: . enoxaparin (LOVENOX) injection  40 mg Subcutaneous Q24H  . feeding supplement (ENSURE ENLIVE)  237 mL Oral BID BM  . predniSONE  10 mg Oral Q breakfast   Continuous Infusions: . sodium chloride 75 mL/hr at 09/30/15 0823       Time spent: 35 minutes    [facility] A  [facility] Pager [phone] If 7PM-7AM, please contact night-coverage at [email or web address], password [facility] 09/30/2015, 10:51 AM  LOS: 3 days

## 2015-09-30 NOTE — Progress Notes (Signed)
Initial Nutrition Assessment  DOCUMENTATION CODES:   Severe malnutrition in context of chronic illness  INTERVENTION:   -Continue Ensure Enlive po BID, each supplement provides 350 kcal and 20 grams of protein -Provide Boost Breeze po TID, each supplement provides 250 kcal and 9 grams of protein -Encourage PO intake -RD to continue to monitor  NUTRITION DIAGNOSIS:   Malnutrition related to chronic illness, cancer and cancer related treatments as evidenced by percent weight loss, moderate depletion of body fat, severe depletion of muscle mass, energy intake < or equal to 75% for > or equal to 1 month.  GOAL:   Patient will meet greater than or equal to 90% of their needs  MONITOR:   PO intake, Supplement acceptance, Labs, Weight trends, Skin, I & O's  REASON FOR ASSESSMENT:   Malnutrition Screening Tool    ASSESSMENT:   80 y.o. female, with past medical history significant for metastatic breast cancer, lymphoma, end-stage, who was scheduled to have hospice started next week in her independent living facility, presenting with generalized weakness, lower extremity swelling and falls the last 2 days, which got worse while having pains given dinner tonight at her daughter's house .   Patient in room with family at bedside. Pt with multiple supplements untouched at bedside. Pt states she ate a bowl of cereal with milk this morning and couldn't eat more than that. She did not order lunch and said she was going to wait and order dinner at 6. Encouraged pt to drink at least 1 of the supplements before dinner time. Pt drinking supplement when RD left room. Patient prefers clear liquid supplements like Boost Breeze, RD to order. Will leave Ensure order as patient states she will try and sip on it.   Pt reports some problems chewing food d/t swelling of jaw. No swallowing issues except one time with pills. Pt has had some recent weight gain, suspect d/t fluid accumulation. Pt has had at least  9% weight loss x 3 months, significant for time frame.  Nutrition-Focused physical exam completed. Findings are moderate fat depletion, severe muscle depletion, and mild edema.   Labs reviewed: Elevated K, BUN, Creatinine Mg/Phos WNL  Diet Order:  Diet 2 gram sodium Room service appropriate?: Yes; Fluid consistency:: Thin; Fluid restriction:: 1500 mL Fluid  Skin:  Reviewed, no issues  Last BM:  11/26  Height:   Ht Readings from Last 1 Encounters:  09/28/15 5\' 2"  (1.575 m)    Weight:   Wt Readings from Last 1 Encounters:  09/28/15 119 lb (53.978 kg)    Ideal Body Weight:  50 kg  BMI:  Body mass index is 21.76 kg/(m^2).  Estimated Nutritional Needs:   Kcal:  1600-1800  Protein:  80-90g  Fluid:  1.6L/day  EDUCATION NEEDS:   Education needs addressed  Clayton Bibles, MS, RD, LDN Pager: [phone] After Hours Pager: [phone]

## 2015-10-01 LAB — RENAL FUNCTION PANEL
ALBUMIN: 2.6 g/dL — AB (ref 3.5–5.0)
Anion gap: 10 (ref 5–15)
BUN: 44 mg/dL — ABNORMAL HIGH (ref 6–20)
CHLORIDE: 106 mmol/L (ref 101–111)
CO2: 22 mmol/L (ref 22–32)
CREATININE: 2.18 mg/dL — AB (ref 0.44–1.00)
Calcium: 8.4 mg/dL — ABNORMAL LOW (ref 8.9–10.3)
GFR, EST AFRICAN AMERICAN: 24 mL/min — AB (ref >60)
GFR, EST NON AFRICAN AMERICAN: 20 mL/min — AB (ref >60)
Glucose, Bld: 111 mg/dL — ABNORMAL HIGH (ref 65–99)
PHOSPHORUS: 4 mg/dL (ref 2.5–4.6)
Potassium: 4.2 mmol/L (ref 3.5–5.1)
Sodium: 138 mmol/L (ref 135–145)

## 2015-10-01 MED ORDER — HYDROCODONE-ACETAMINOPHEN 5-325 MG PO TABS
1.0000 | ORAL_TABLET | ORAL | Status: DC | PRN
  Administered 2015-10-01 – 2015-10-02 (×4): 1 via ORAL
  Filled 2015-10-01 (×4): qty 1

## 2015-10-01 NOTE — Telephone Encounter (Signed)
Voicemail: "Patient currently admitted.  Admission to Hospice has been cancelled."

## 2015-10-01 NOTE — Care Management Important Message (Signed)
Important Message  Patient Details  Name: Helen [name] MRN: [id number] Date of Birth: [birth year]   Medicare Important Message Given:  Yes    Camillo Flaming 10/01/2015, 12:01 [place] Message  Patient Details  Name: Helen [name] MRN: [id number] Date of Birth: [birth year]   Medicare Important Message Given:  Yes    Camillo Flaming 10/01/2015, 12:01 PM

## 2015-10-01 NOTE — Progress Notes (Signed)
Physical Therapy Treatment Patient Details Name: Helen [name] MRN: [id number] DOB: 01/19/1935 Today's Date: 10/01/2015    History of Present Illness Helen [name] is a 80 y.o. female, with past medical history significant for metastatic breast cancer, lymphoma, end-stage, who was scheduled to have hospice started next week in her independent living facility, presenting 09/27/15  with generalized weakness, lower extremity swelling and falls the last 2 days, which got worse    PT Comments    Pt. Required mod assist x1, elevated HOB and use of bed pad to bring trunk upright and navigate BLE to bring to EOB. Upon sitting EOB pt. Required varied assist (min guard to max assist) with seated balance, needing verbal and/or tactile cues with external assist to keep from leaning posteriorly - this varied in the 5-7 minutes pt. Was seated at EOB.  Also, required bilateral UE and LE support for sitting balance, scoring her at POOR. Pt. Requires max assist x2 for SPT from bed to recliner, pt. Unable to bear weight through BLE with Bilateral Knee Buckling during standing/transfer. HIGH FALL RISK. Pt. Will benefit from SNF.    Follow Up Recommendations  SNF;Supervision/Assistance - 24 hour     Equipment Recommendations  None recommended by PT    Recommendations for Other Services       Precautions / Restrictions Precautions Precautions: Fall Restrictions Weight Bearing Restrictions: No    Mobility  Bed Mobility Overal bed mobility: Needs Assistance Bed Mobility: Supine to Sit     Supine to sit: Mod assist     General bed mobility comments: assist and use of bed pad for upright position with trunk and navigation of BLE  Transfers Overall transfer level: Needs assistance Equipment used: 2 person hand held assist Transfers: Stand Pivot Transfers   Stand pivot transfers: +2 physical assistance;Max assist;+2 safety/equipment       General transfer comment: patient unable to stand  self; BLEs buckle in standing  Ambulation/Gait                 Stairs            Wheelchair Mobility    Modified Rankin (Stroke Patients Only)       Balance Overall balance assessment: Needs assistance;History of Falls Sitting-balance support: Bilateral upper extremity supported;Feet supported Sitting balance-Leahy Scale: Poor   Postural control: Posterior lean                          Cognition Arousal/Alertness: Awake/alert Behavior During Therapy: WFL for tasks assessed/performed Overall Cognitive Status: Within Functional Limits for tasks assessed                      Exercises  seated long arc quads X 10 RLE AROM, x 10 LLE AAROM    General Comments        Pertinent Vitals/Pain Pain Assessment: No/denies pain    Home Living                      Prior Function            PT Goals (current goals can now be found in the care plan section) Acute Rehab PT Goals Time For Goal Achievement: 10/13/15 Potential to Achieve Goals: Fair Progress towards PT goals: Progressing toward goals    Frequency  Min 3X/week    PT Plan      Co-evaluation  End of Session Equipment Utilized During Treatment: Gait belt Activity Tolerance: Patient limited by fatigue Patient left: in chair;with call bell/phone within reach;with chair alarm set     Time: [id number] PT Time Calculation (min) (ACUTE ONLY): 28 min  Charges:  $Therapeutic Activity: 23-37 mins                    G CodesDenna Haggard, SPTA   10/01/2015 3:51 PM   Pager: [phone]   Philomena Doheny PT 10/01/2015  [phone]

## 2015-10-01 NOTE — Progress Notes (Signed)
CSW continuing to follow for disposition planning.   CSW spoke with pt son, Evette Doffing this morning via telephone. Pt son wanted to cancel [facility] meeting that was planned to take place in pt home. CSW discussed with pt son that pt son would have to contact Hospice and [place] to cancel that appointment and provided pt son with contact phone number to [facility].   CSW continued discussion with pt son surrounding SNF placement. Pt son states that first choice is [facility]. CSW discussed with pt son that CSW encourages pt and pt family to have secondary option in mind in case [facility] is full when pt medically ready for discharge. Pt son expressed understanding.  Per MD, pt not yet medically ready for discharge.  CSW to continue to follow to provide support and assist with pt disposition needs.   Alison Murray, MSW, [place] [facility] [phone]

## 2015-10-01 NOTE — Progress Notes (Signed)
[facility] PROGRESS NOTE   Helen [name] [id number] DOB: 1935-02-07 DOA: 09/27/2015 PCP: Alvy Bimler NI, MD  HPI/Subjective: Feels better, still has lower extremity swelling. Patient was very sarcastic about the medical care she is getting.   Assessment/Plan: Active Problems:   Failure to thrive (0-17)   DNR (do not resuscitate) discussion   Leg swelling   Metastatic cancer ([place])   Acute kidney injury ([place])   Lower extremity swelling Patient has bilateral pitting edema, mild hypoalbuminemia. Bilateral lower extremity Dopplers negative for DVT. Started on low-dose of Lasix, creatinine increase Lasix discontinued. Unclear etiology, could be secondary to lymph vessels blockage by cancer.  Acute kidney injury Patient presented with creatinine of 0.9, started on 20 mg of IV Lasix. This is got worse, creatinine is 2.27 today, I have started her on gentle IV fluids. I told her this is might increase her lower extremity edema but will improve the creatinine. There is fine balance for lower extremity swelling and acute kidney injury.  Hypokalemia Repleted with oral supplements.  Failure to thrive Patient has advanced metastatic breast cancer and lymphoma. Came in with worsening generalized weakness. PT/OT to evaluate and treat, follow clinically.  Metastatic breast cancer Primary is cancer of the left breast, per last note from Dr. Alvy Bimler on 09/24/15, responding to treatment. There is no subcutaneous lesions and clear if it's secondary to cancer or not. Has very poor appetite, profound weakness, recent falls and rapid decline in functional status. Per oncology patient qualifies for palliative care, palliative care consulted, patient and family wants to continue medical care. DNR/DNI documented, patient hopeful for improvement and asked to treat the treatable conditions.  Acute thrombosis of the right internal jugular vein Was on Xarleto secondary to acute thrombosis  of the right internal jugular vein from the cancer. Xarelto discontinued (by oncology) because of recent bleeding.  Protein calorie malnutrition Cancer cachexia, dietitian to see.  Code Status: DNR Family Communication: Plan discussed with the patient. Disposition Plan: Remains inpatient Diet: Diet 2 gram sodium Room service appropriate?: Yes; Fluid consistency:: Thin; Fluid restriction:: 1500 mL Fluid  Consultants: Hospice and palliative medicine   Procedures:  None  Antibiotics:  None   Objective: Filed Vitals:   09/30/15 2338 10/01/15 0626  BP: 145/80 132/67  Pulse: 102 95  Temp: 98.2 F (36.8 C) 98 F (36.7 C)  Resp: 20 20    Intake/Output Summary (Last 24 hours) at 10/01/15 1138 Last data filed at 09/30/15 1900  Gross per 24 hour  Intake    600 ml  Output      0 ml  Net    600 ml   Filed Weights   09/27/15 2248 09/28/15 0205  Weight: 53.524 kg (118 lb) 53.978 kg (119 lb)    Exam: General: Alert and awake, oriented x3, not in any acute distress. HEENT: anicteric sclera, pupils reactive to light and accommodation, EOMI CVS: S1-S2 clear, no murmur rubs or gallops Chest: clear to auscultation bilaterally, no wheezing, rales or rhonchi Abdomen: soft nontender, nondistended, normal bowel sounds, no organomegaly Extremities: no cyanosis, clubbing or edema noted bilaterally Neuro: Cranial nerves II-XII intact, no focal neurological deficits  Data Reviewed: Basic Metabolic Panel:  Recent Labs Lab 09/27/15 2209 09/28/15 0400 09/29/15 0625 09/30/15 0428 10/01/15 0408  NA 137 139 137 135 138  K 2.8* 2.9* 4.9 5.8* 4.2  CL 101 104 103 103 106  CO2  --  23 25 21* 22  GLUCOSE 128* 94 105* 91 111*  BUN 23* 25*  29* 37* 44*  CREATININE 0.90 0.90 1.25* 2.27* 2.18*  CALCIUM  --  8.9 9.6 8.7* 8.4*  MG  --   --  2.4  --   --   PHOS  --   --  3.1 4.1 4.0   Liver Function Tests:  Recent Labs Lab 09/29/15 0625 09/30/15 0428 10/01/15 0408  ALBUMIN 3.1*  2.9* 2.6*   No results for input(s): LIPASE, AMYLASE in the last 168 hours. No results for input(s): AMMONIA in the last 168 hours. CBC:  Recent Labs Lab 09/27/15 2150 09/27/15 2209 09/29/15 0625  WBC 9.4  --  7.9  NEUTROABS 8.2*  --   --   HGB 9.1* 9.5* 8.8*  HCT 28.9* 28.0* 27.7*  MCV 81.9  --  80.8  PLT 314  --  286   Cardiac Enzymes: No results for input(s): CKTOTAL, CKMB, CKMBINDEX, TROPONINI in the last 168 hours. BNP (last 3 results)  Recent Labs  09/27/15 2150  BNP 64.6    ProBNP (last 3 results) No results for input(s): PROBNP in the last 8760 hours.  CBG: No results for input(s): GLUCAP in the last 168 hours.  Micro No results found for this or any previous visit (from the past 240 hour(s)).   Studies: No results found.  Scheduled Meds: . enoxaparin (LOVENOX) injection  30 mg Subcutaneous Q24H  . feeding supplement  1 Container Oral TID BM  . feeding supplement (ENSURE ENLIVE)  237 mL Oral BID BM  . predniSONE  10 mg Oral Q breakfast   Continuous Infusions: . sodium chloride 75 mL/hr at 10/01/15 1125       Time spent: 35 minutes    [facility] A  [facility] Pager [phone] If 7PM-7AM, please contact night-coverage at [email or web address], password [facility] 10/01/2015, 11:38 AM  LOS: 4 days

## 2015-10-01 NOTE — Clinical Documentation Improvement (Signed)
Internal Medicine  Please clarify severity of protein calorie malnutrition in progress notes and discharge summary    Document Severity - Severe(third degree), Moderate (second degree), Mild (first degree)  Other condition  Unable to clinically determine  Document any associated diagnoses/conditions   Supporting Information: :   RD assessment  5'2"   119 lbs. BMI 21.76  Protein calorie malnutrition Cancer cachexia, dietitian to see Initial Nutrition Assessment  DOCUMENTATION CODES:   Severe malnutrition in context of chronic illness INTERVENTION:   -Continue Ensure Enlive po BID, each supplement provides 350 kcal and 20 grams of protein -Provide Boost Breeze po TID, each supplement provides 250 kcal and 9 grams of protein -Encourage PO intake -RD to continue to monitor  NUTRITION DIAGNOSIS:   Malnutrition related to chronic illness, cancer and cancer related treatments as evidenced by percent weight loss, moderate depletion of body fat, severe depletion of muscle mass, energy intake < or equal to 75% for > or equal to 1 month.  Please exercise your independent, professional judgment when responding. A specific answer is not anticipated or expected.   Thank You, [place] [phone]

## 2015-10-02 DIAGNOSIS — E43 Unspecified severe protein-calorie malnutrition: Secondary | ICD-10-CM

## 2015-10-02 LAB — RENAL FUNCTION PANEL
ALBUMIN: 2.6 g/dL — AB (ref 3.5–5.0)
ANION GAP: 7 (ref 5–15)
BUN: 36 mg/dL — AB (ref 6–20)
CHLORIDE: 108 mmol/L (ref 101–111)
CO2: 22 mmol/L (ref 22–32)
Calcium: 8.1 mg/dL — ABNORMAL LOW (ref 8.9–10.3)
Creatinine, Ser: 1.96 mg/dL — ABNORMAL HIGH (ref 0.44–1.00)
GFR calc Af Amer: 27 mL/min — ABNORMAL LOW (ref >60)
GFR, EST NON AFRICAN AMERICAN: 23 mL/min — AB (ref >60)
Glucose, Bld: 86 mg/dL (ref 65–99)
PHOSPHORUS: 3.9 mg/dL (ref 2.5–4.6)
POTASSIUM: 4.3 mmol/L (ref 3.5–5.1)
Sodium: 137 mmol/L (ref 135–145)

## 2015-10-02 MED ORDER — BOOST / RESOURCE BREEZE PO LIQD: 1.0000 | Freq: Three times a day (TID) | ORAL | Status: AC

## 2015-10-02 MED ORDER — HEPARIN SOD (PORK) LOCK FLUSH 100 UNIT/ML IV SOLN
500.0000 [IU] | Freq: Once | INTRAVENOUS | Status: AC
  Administered 2015-10-02: 500 [IU] via INTRAVENOUS
  Filled 2015-10-02: qty 5

## 2015-10-02 MED ORDER — HYDROCODONE-ACETAMINOPHEN 5-325 MG PO TABS: 1.0000 | ORAL_TABLET | Freq: Four times a day (QID) | ORAL | Status: DC | PRN

## 2015-10-02 NOTE — Discharge Summary (Signed)
Physician Discharge Summary  Helen [name] [id number] DOB: 1935/05/17 DOA: 09/27/2015  PCP: Helen Lark, MD  Admit date: 09/27/2015 Discharge date: 10/02/2015  Time spent: 40 minutes  Recommendations for Outpatient Follow-up:  1. Follow-up with primary oncologist within 1 week.   Discharge Diagnoses:  Active Problems:   Failure to thrive (0-17)   DNR (do not resuscitate) discussion   Leg swelling   Metastatic cancer ([place])   Acute kidney injury ([place])   Severe malnutrition ([place])   Discharge Condition: Stable  Diet recommendation: October the  Filed Weights   09/27/15 2248 09/28/15 0205  Weight: 53.524 kg (118 lb) 53.978 kg (119 lb)    History of present illness:  Helen [name] is a 80 y.o. female, with past medical history significant for metastatic breast cancer, lymphoma, end-stage, who was scheduled to have hospice started next week in her independent living facility, presenting with generalized weakness, lower extremity swelling and falls the last 2 days, which got worse while having pains given dinner tonight at her daughter's house . I reviewed her chart from the clinic and the patient seems to have been informed about her prognosis by her oncologist. The patient denies any chest pains, shortness of breath or palpitations. Her main concern was swelling in her lower extremities and was wondering if we can give her a pill that will help her with that. At the same time the patient lives alone in her facility and hospice is not going to be able to start until Monday . Patient had to stop xarelto 2 days ago because of gum bleeding, and is now taken prednisone in addition to pain medication only. She noted the swelling getting worse in the last 2 days.  Hospital Course:   Lower extremity swelling Patient has bilateral pitting edema, mild hypoalbuminemia. Bilateral lower extremity Dopplers negative for DVT. Started on low-dose of Lasix, creatinine increase Lasix  discontinued. Unclear etiology, could be secondary to lymph vessels blockage by cancer. Keep legs elevated.  Acute kidney injury Patient presented with creatinine of 0.9, started on 20 mg of IV Lasix. This is got worse, creatinine is 2.27 today, I have started her on gentle IV fluids. I told her this is might increase her lower extremity edema but will improve the creatinine. There is fine balance for lower extremity swelling and acute kidney injury. Creatinine improved to 1.96 today, I asked her to keep herself hydrated and drink a lot of fluids.  Hypokalemia Repleted with oral supplements.  Failure to thrive Patient has advanced metastatic breast cancer and lymphoma. Came in with worsening generalized weakness. PT/OT recommended skilled nursing facility.  Metastatic breast cancer Primary is cancer of the left breast, per last note from Dr. Alvy Bimler on 09/24/15, responding to treatment. There is no subcutaneous lesions and clear if it's secondary to cancer or not. Has very poor appetite, profound weakness, recent falls and rapid decline in functional status. Per oncology patient qualifies for palliative care, palliative care consulted, patient and family wants to continue medical care. DNR/DNI documented, patient hopeful for improvement and asked to treat the treatable conditions. Follow-up with her primary oncologist without patient has an appointment on 10/12/2015  Acute thrombosis of the right internal jugular vein Was on Xarleto secondary to acute thrombosis of the right internal jugular vein from the cancer. Xarelto discontinued (by oncology) because of recent bleeding.  Protein calorie malnutrition Cancer cachexia, dietitian to see. Protein of 2.6.  Cutaneous lesions Patient has cutaneous lesions involving her torso, per oncology this could be  cancer lesions as well. I have asked the patient if she wants this to be biopsied, she  declined.   Procedures:  None  Consultations:  None  Discharge Exam: Filed Vitals:   10/01/15 1900 10/02/15 0520  BP: 134/71 128/72  Pulse: 100 92  Temp: 97.9 F (36.6 C) 98.1 F (36.7 C)  Resp: 18 20   General: Alert and awake, oriented x3, not in any acute distress. HEENT: anicteric sclera, pupils reactive to light and accommodation, EOMI CVS: S1-S2 clear, no murmur rubs or gallops Chest: clear to auscultation bilaterally, no wheezing, rales or rhonchi Abdomen: soft nontender, nondistended, normal bowel sounds, no organomegaly Extremities: no cyanosis, clubbing or edema noted bilaterally Neuro: Cranial nerves II-XII intact, no focal neurological deficits   Discharge Instructions   Discharge Instructions    Diet - low sodium heart healthy    Complete by:  As directed      Increase activity slowly    Complete by:  As directed           Current Discharge Medication List    START taking these medications   Details  feeding supplement (BOOST / RESOURCE BREEZE) LIQD Take 1 Container by mouth 3 (three) times daily between meals. Refills: 0      CONTINUE these medications which have CHANGED   Details  HYDROcodone-acetaminophen (NORCO) 5-325 MG tablet Take 1 tablet by mouth every 6 (six) hours as needed for moderate pain. Qty: 10 tablet, Refills: 0   Associated Diagnoses: Cancer of left breast ([place]); Metastasis to bone ([place])      CONTINUE these medications which have NOT CHANGED   Details  !! predniSONE (DELTASONE) 10 MG tablet Take 1 tablet (10 mg total) by mouth daily with breakfast. Qty: 30 tablet, Refills: 1    !! predniSONE (DELTASONE) 20 MG tablet Take 3 tablets (60 mg total) by mouth daily. Take on days 2-5 of chemotherapy. Qty: 12 tablet, Refills: 6   Associated Diagnoses: Lymphoma, small lymphocytic ([place]); Metastasis to bone [facility]); Malignant neoplasm of left female breast, unspecified site of breast [facility])     !! - Potential duplicate medications  found. Please discuss with provider.    STOP taking these medications     rivaroxaban (XARELTO) 20 MG TABS tablet        Allergies  Allergen Reactions  . Codeine Other (See Comments)    Reaction: seizures  . Penicillins Other (See Comments)    Reaction: Seizures Has patient had a PCN reaction causing immediate rash, facial/tongue/throat swelling, SOB or lightheadedness with hypotension: Yes Has patient had a PCN reaction causing severe rash involving mucus membranes or skin necrosis: Yes Has patient had a PCN reaction that required hospitalization Yes Has patient had a PCN reaction occurring within the last 10 years: No If all of the above answers are "NO", then may proceed with Cephalosporin use.   . Sulfonamide Derivatives Itching  . Tape Rash  . Tramadol Hcl Rash      The results of significant diagnostics from this hospitalization (including imaging, microbiology, ancillary and laboratory) are listed below for reference.    Significant Diagnostic Studies: Ct Chest W Contrast  09/21/2015  CLINICAL DATA:  Subsequent encounter for lymphoma with concurrent breast cancer. EXAM: CT CHEST, ABDOMEN, AND PELVIS WITH CONTRAST TECHNIQUE: Multidetector CT imaging of the chest, abdomen and pelvis was performed following the standard protocol during bolus administration of intravenous contrast. CONTRAST:  180mL OMNIPAQUE IOHEXOL 300 MG/ML  SOLN COMPARISON:  07/02/2015. FINDINGS: CT CHEST FINDINGS  Mediastinum/Lymph Nodes: Right Port-A-Cath tip is at the SVC/ RA junction. There has been marked interval decrease in right axillary lymphadenopathy. 3.3 cm short axis right axillary lymph node measured previously is now 1.2 cm. Anterior right axillary lymph node which was 2.6 cm (my measurement) on the previous study is now 1.1 cm. 11 mm right paratracheal lymph node measured previously has resolved completely. 2.1 cm necrotic subcarinal lymph node is new in the interval. 13 mm short axis left hilar  lymph node was 9 mm previously. Heart size is normal. No pericardial effusion. Lungs/Pleura: Left apical bronchiectasis and scarring is unchanged. There is new airspace disease in the right apex, likely secondary to previous radiation treatment of lymphadenopathy in the right neck the masslike area of airspace consolidation in the left base has decreased, measuring 3.7 x 2.6 cm today compared to 6.0 x 3.8 cm previously. There is right base collapse/ consolidation. Small right pleural effusion has progressed in the interval. Musculoskeletal: Previously described lucent lesion in the right humeral neck is stable. CT ABDOMEN PELVIS FINDINGS Hepatobiliary: Metastatic lesion in the dome of the liver measures 2.8 x 2.6 cm today compared to 2.6 x 2.1 cm previously. Index lesion measured previously in the inferior right liver had a bilobed appearance. The more medial moiety of this lesion has decreased substantially in size in the interval while the more lateral portion of this lesion is unchanged. Overall measurements today are 3.7 x 3.9 cm compared to 3.8 x 5.5 cm previously. Index lesion measured in the lateral segment left liver previously at 2.9 x 1.6 cm now measures 2.7 x 1.8 cm. On today's exam, there is a 7 mm lesion in the medial segment left liver (image 52 series 2) and this same lesion was 1.8 cm on the previous study. Large stone again identified in the gallbladder. Mild biliary duct prominence is stable. Pancreas: No focal mass lesion. No dilatation of the main duct. No intraparenchymal cyst. No peripancreatic edema. Spleen: 11 mm lesion in the spleen on the previous study has resolved in the interval, but there are multiple new and enlarging splenic lesions. 1.5 cm lesion in the central spleen (image 43 series 2) on today's study was 4 mm previously. 11 mm lesion in the anterior spleen (image 45) is new in the interval. Adrenals/Urinary Tract: No adrenal nodule or mass. Multiple cysts seen in the kidneys  bilaterally are stable. No evidence for hydroureter. The urinary bladder appears normal for the degree of distention. Stomach/Bowel: Stomach is nondistended. No gastric wall thickening. No evidence of outlet obstruction. Duodenum is normally positioned as is the ligament of Treitz. No small bowel wall thickening. No small bowel dilatation. The terminal ileum is normal. No gross colonic mass. No colonic wall thickening. No substantial diverticular change. Vascular/Lymphatic: 10 mm left retrocrural lymph node has progressed in the interval. No gastrohepatic or hepatoduodenal ligament lymphadenopathy. No para-aortic lymphadenopathy. 11 mm short axis right pelvic sidewall lymph node measured previously has progressed at 21 mm. The 16 mm left pelvic sidewall lymph nodes seen previously is now 25 mm. Bilateral inguinal lymphadenopathy has progressed as well. The 11 mm index lymph node in the right inguinal region measured previously is now 28 mm. Reproductive: Fibroid change noted in the uterus. There is no adnexal mass. Other: No intraperitoneal free fluid. Musculoskeletal: 2.7 x 1.8 cm right iliac lesion on the previous study is not substantially changed measuring 3.1 x 1.9 cm. Other areas of metastatic involvement in the bony pelvis are again noted. IMPRESSION:  1. Mixed interval response to therapy. 2. Right subpectoral and axillary lymphadenopathy shows substantial interval decrease in size. Upper mediastinal lymphadenopathy has resolved. However, new mediastinal lymphadenopathy is now evident and pelvic lymphadenopathy shows interval progression. 3. Multiple liver metastases, some of which are improved and others of which are stable. 4. Index splenic lesion seen on the previous study has resolved but new splenic lesions are evident on today's exam. 5. No substantial change in previously identified bony lesions. 6. Cholelithiasis. Electronically Signed   By: Misty Stanley M.D.   On: 09/21/2015 10:36   Ct Abdomen  Pelvis W Contrast  09/21/2015  CLINICAL DATA:  Subsequent encounter for lymphoma with concurrent breast cancer. EXAM: CT CHEST, ABDOMEN, AND PELVIS WITH CONTRAST TECHNIQUE: Multidetector CT imaging of the chest, abdomen and pelvis was performed following the standard protocol during bolus administration of intravenous contrast. CONTRAST:  17mL OMNIPAQUE IOHEXOL 300 MG/ML  SOLN COMPARISON:  07/02/2015. FINDINGS: CT CHEST FINDINGS Mediastinum/Lymph Nodes: Right Port-A-Cath tip is at the SVC/ RA junction. There has been marked interval decrease in right axillary lymphadenopathy. 3.3 cm short axis right axillary lymph node measured previously is now 1.2 cm. Anterior right axillary lymph node which was 2.6 cm (my measurement) on the previous study is now 1.1 cm. 11 mm right paratracheal lymph node measured previously has resolved completely. 2.1 cm necrotic subcarinal lymph node is new in the interval. 13 mm short axis left hilar lymph node was 9 mm previously. Heart size is normal. No pericardial effusion. Lungs/Pleura: Left apical bronchiectasis and scarring is unchanged. There is new airspace disease in the right apex, likely secondary to previous radiation treatment of lymphadenopathy in the right neck the masslike area of airspace consolidation in the left base has decreased, measuring 3.7 x 2.6 cm today compared to 6.0 x 3.8 cm previously. There is right base collapse/ consolidation. Small right pleural effusion has progressed in the interval. Musculoskeletal: Previously described lucent lesion in the right humeral neck is stable. CT ABDOMEN PELVIS FINDINGS Hepatobiliary: Metastatic lesion in the dome of the liver measures 2.8 x 2.6 cm today compared to 2.6 x 2.1 cm previously. Index lesion measured previously in the inferior right liver had a bilobed appearance. The more medial moiety of this lesion has decreased substantially in size in the interval while the more lateral portion of this lesion is unchanged.  Overall measurements today are 3.7 x 3.9 cm compared to 3.8 x 5.5 cm previously. Index lesion measured in the lateral segment left liver previously at 2.9 x 1.6 cm now measures 2.7 x 1.8 cm. On today's exam, there is a 7 mm lesion in the medial segment left liver (image 52 series 2) and this same lesion was 1.8 cm on the previous study. Large stone again identified in the gallbladder. Mild biliary duct prominence is stable. Pancreas: No focal mass lesion. No dilatation of the main duct. No intraparenchymal cyst. No peripancreatic edema. Spleen: 11 mm lesion in the spleen on the previous study has resolved in the interval, but there are multiple new and enlarging splenic lesions. 1.5 cm lesion in the central spleen (image 43 series 2) on today's study was 4 mm previously. 11 mm lesion in the anterior spleen (image 45) is new in the interval. Adrenals/Urinary Tract: No adrenal nodule or mass. Multiple cysts seen in the kidneys bilaterally are stable. No evidence for hydroureter. The urinary bladder appears normal for the degree of distention. Stomach/Bowel: Stomach is nondistended. No gastric wall thickening. No evidence of outlet  obstruction. Duodenum is normally positioned as is the ligament of Treitz. No small bowel wall thickening. No small bowel dilatation. The terminal ileum is normal. No gross colonic mass. No colonic wall thickening. No substantial diverticular change. Vascular/Lymphatic: 10 mm left retrocrural lymph node has progressed in the interval. No gastrohepatic or hepatoduodenal ligament lymphadenopathy. No para-aortic lymphadenopathy. 11 mm short axis right pelvic sidewall lymph node measured previously has progressed at 21 mm. The 16 mm left pelvic sidewall lymph nodes seen previously is now 25 mm. Bilateral inguinal lymphadenopathy has progressed as well. The 11 mm index lymph node in the right inguinal region measured previously is now 28 mm. Reproductive: Fibroid change noted in the uterus. There  is no adnexal mass. Other: No intraperitoneal free fluid. Musculoskeletal: 2.7 x 1.8 cm right iliac lesion on the previous study is not substantially changed measuring 3.1 x 1.9 cm. Other areas of metastatic involvement in the bony pelvis are again noted. IMPRESSION: 1. Mixed interval response to therapy. 2. Right subpectoral and axillary lymphadenopathy shows substantial interval decrease in size. Upper mediastinal lymphadenopathy has resolved. However, new mediastinal lymphadenopathy is now evident and pelvic lymphadenopathy shows interval progression. 3. Multiple liver metastases, some of which are improved and others of which are stable. 4. Index splenic lesion seen on the previous study has resolved but new splenic lesions are evident on today's exam. 5. No substantial change in previously identified bony lesions. 6. Cholelithiasis. Electronically Signed   By: Misty Stanley M.D.   On: 09/21/2015 10:36    Microbiology: No results found for this or any previous visit (from the past 240 hour(s)).   Labs: Basic Metabolic Panel:  Recent Labs Lab 09/28/15 0400 09/29/15 0625 09/30/15 0428 10/01/15 0408 10/02/15 0653  NA 139 137 135 138 137  K 2.9* 4.9 5.8* 4.2 4.3  CL 104 103 103 106 108  CO2 23 25 21* 22 22  GLUCOSE 94 105* 91 111* 86  BUN 25* 29* 37* 44* 36*  CREATININE 0.90 1.25* 2.27* 2.18* 1.96*  CALCIUM 8.9 9.6 8.7* 8.4* 8.1*  MG  --  2.4  --   --   --   PHOS  --  3.1 4.1 4.0 3.9   Liver Function Tests:  Recent Labs Lab 09/29/15 0625 09/30/15 0428 10/01/15 0408 10/02/15 0653  ALBUMIN 3.1* 2.9* 2.6* 2.6*   No results for input(s): LIPASE, AMYLASE in the last 168 hours. No results for input(s): AMMONIA in the last 168 hours. CBC:  Recent Labs Lab 09/27/15 2150 09/27/15 2209 09/29/15 0625  WBC 9.4  --  7.9  NEUTROABS 8.2*  --   --   HGB 9.1* 9.5* 8.8*  HCT 28.9* 28.0* 27.7*  MCV 81.9  --  80.8  PLT 314  --  286   Cardiac Enzymes: No results for input(s):  CKTOTAL, CKMB, CKMBINDEX, TROPONINI in the last 168 hours. BNP: BNP (last 3 results)  Recent Labs  09/27/15 2150  BNP 64.6    ProBNP (last 3 results) No results for input(s): PROBNP in the last 8760 hours.  CBG: No results for input(s): GLUCAP in the last 168 hours.     Signed:  Maryuri Warnke A  [facility] 10/02/2015, 11:39 AM

## 2015-10-02 NOTE — Clinical Social Work Placement (Signed)
   CLINICAL SOCIAL WORK PLACEMENT  NOTE  Date:  10/02/2015  Patient Details  Name: Helen [name] MRN: [id number] Date of Birth: 1935-07-17  Clinical Social Work is seeking post-discharge placement for this patient at the [place] level of care (*CSW will initial, date and re-position this form in  chart as items are completed):  Yes   Patient/family provided with [place] Work Department's list of facilities offering this level of care within the geographic area requested by the patient (or if unable, by the patient's family).  Yes   Patient/family informed of their freedom to choose among providers that offer the needed level of care, that participate in Medicare, Medicaid or managed care program needed by the patient, have an available bed and are willing to accept the patient.  Yes   Patient/family informed of [facility]'s ownership interest in [facility] and [facility], as well as of the fact that they are under no obligation to receive care at these facilities.  PASRR submitted to EDS on 09/29/15     PASRR number received on 09/29/15     Existing PASRR number confirmed on       FL2 transmitted to all facilities in geographic area requested by pt/family on 09/29/15     FL2 transmitted to all facilities within larger geographic area on       Patient informed that his/her managed care company has contracts with or will negotiate with certain facilities, including the following:        Yes   Patient/family informed of bed offers received.  Patient chooses bed at [facility]     Physician recommends and patient chooses bed at      Patient to be transferred to [facility] on 10/02/15.  Patient to be transferred to facility by ambulance [facility])     Patient family notified on 10/02/15 of transfer.  Name of family member notified:  pt notified at bedside and pt son, Evette Doffing notified via telephone      PHYSICIAN Please sign FL2, Please sign DNR     Additional Comment:    _______________________________________________ Ladell Pier, LCSW 10/02/2015, 1:25 PM

## 2015-10-02 NOTE — Progress Notes (Signed)
CSW continuing to follow.   CSW received notification from MD that pt medically ready for discharge today.   CSW received notification from [facility] that shared room became available today.  CSW spoke with pt son, Evette Doffing via telephone who is agreeable to [facility] today.  CSW facilitated pt discharge needs including contacting facility, faxing pt discharge information via [place], discussing with pt at bedside and pt son via telephone, providing RN phone number to call report, and arranging ambulance transport for pt to [facility] scheduled for 2 pm pick up.  No further social work needs identified at this time.  CSW signing off.   Alison Murray, MSW, [place] Work [phone]

## 2015-10-04 ENCOUNTER — Ambulatory Visit (HOSPITAL_COMMUNITY)
Admission: RE | Admit: 2015-10-04 | Discharge: 2015-10-04 | Disposition: A | Discharge status: Home / Self Care | Payer: Medicare Other | Source: Ambulatory Visit | Attending: Hematology and Oncology | Admitting: Hematology and Oncology

## 2015-10-09 ENCOUNTER — Telehealth: Payer: Self-pay | Admitting: Hematology and Oncology

## 2015-10-09 NOTE — Telephone Encounter (Signed)
FAXED PT [place]. RELEASE [id number]

## 2015-10-10 ENCOUNTER — Telehealth: Payer: Self-pay | Admitting: *Deleted

## 2015-10-10 ENCOUNTER — Encounter: Payer: Self-pay | Admitting: Hematology and Oncology

## 2015-10-10 NOTE — Progress Notes (Signed)
Faxed forms to [facility] [phone], forward copy to medical records.

## 2015-10-10 NOTE — Telephone Encounter (Signed)
LVM for pt's son, Helen [name], to check on pt's condition.  She has appt here on Monday 12/12 for Lab/MD/Transfusion and we are not sure if pt is able to make it.   Pt had been referred for Hospice Services but Dr. Alvy Bimler is unsure if pt had agreed or actually enrolled in Hospice or plans to continue to have transfusions here as needed? Pt was d/c'd from hospital to SNF but unsure if she is still there or back home?  Asked Son to please call us back w/ update.

## 2015-10-11 ENCOUNTER — Telehealth: Payer: Self-pay

## 2015-10-11 NOTE — Telephone Encounter (Signed)
Son called with update. Pt is at [facility] for strengthening. She is still weak and has some swelling in her legs. They are using prednisone for apetite and compression hose for swelling. The pt will be transported by [facility] to [facility] on Monday with [facility] meeting her here. They want to try some strengthening before deciding on hospice.

## 2015-10-15 ENCOUNTER — Inpatient Hospital Stay (HOSPITAL_COMMUNITY): Payer: Medicare Other

## 2015-10-15 ENCOUNTER — Other Ambulatory Visit (HOSPITAL_BASED_OUTPATIENT_CLINIC_OR_DEPARTMENT_OTHER): Payer: Medicare Other

## 2015-10-15 ENCOUNTER — Inpatient Hospital Stay (HOSPITAL_COMMUNITY)
Admission: EM | Admit: 2015-10-15 | Discharge: 2015-10-19 | DRG: 552 | Disposition: A | Discharge status: Hospice | Payer: Medicare Other | Attending: Internal Medicine | Admitting: Internal Medicine

## 2015-10-15 ENCOUNTER — Telehealth: Payer: Self-pay | Admitting: *Deleted

## 2015-10-15 ENCOUNTER — Encounter (HOSPITAL_COMMUNITY): Payer: Self-pay | Admitting: Emergency Medicine

## 2015-10-15 ENCOUNTER — Emergency Department (HOSPITAL_COMMUNITY): Payer: Medicare Other

## 2015-10-15 ENCOUNTER — Other Ambulatory Visit: Payer: Self-pay | Admitting: *Deleted

## 2015-10-15 ENCOUNTER — Ambulatory Visit (HOSPITAL_BASED_OUTPATIENT_CLINIC_OR_DEPARTMENT_OTHER): Payer: Medicare Other | Admitting: Hematology and Oncology

## 2015-10-15 VITALS — BP 98/47 | HR 106 | Temp 97.9°F | Resp 18 | Ht 62.0 in

## 2015-10-15 DIAGNOSIS — R32 Unspecified urinary incontinence: Secondary | ICD-10-CM | POA: Diagnosis present

## 2015-10-15 DIAGNOSIS — G9529 Other cord compression: Secondary | ICD-10-CM | POA: Diagnosis present

## 2015-10-15 DIAGNOSIS — Z880 Allergy status to penicillin: Secondary | ICD-10-CM

## 2015-10-15 DIAGNOSIS — Z515 Encounter for palliative care: Secondary | ICD-10-CM | POA: Diagnosis present

## 2015-10-15 DIAGNOSIS — Z882 Allergy status to sulfonamides status: Secondary | ICD-10-CM | POA: Diagnosis not present

## 2015-10-15 DIAGNOSIS — G893 Neoplasm related pain (acute) (chronic): Secondary | ICD-10-CM | POA: Diagnosis not present

## 2015-10-15 DIAGNOSIS — R221 Localized swelling, mass and lump, neck: Secondary | ICD-10-CM

## 2015-10-15 DIAGNOSIS — L8990 Pressure ulcer of unspecified site, unspecified stage: Secondary | ICD-10-CM | POA: Diagnosis present

## 2015-10-15 DIAGNOSIS — R05 Cough: Secondary | ICD-10-CM

## 2015-10-15 DIAGNOSIS — C8590 Non-Hodgkin lymphoma, unspecified, unspecified site: Secondary | ICD-10-CM | POA: Diagnosis present

## 2015-10-15 DIAGNOSIS — M47029 Vertebral artery compression syndromes, site unspecified: Principal | ICD-10-CM | POA: Diagnosis present

## 2015-10-15 DIAGNOSIS — C8300 Small cell B-cell lymphoma, unspecified site: Secondary | ICD-10-CM | POA: Diagnosis present

## 2015-10-15 DIAGNOSIS — D649 Anemia, unspecified: Secondary | ICD-10-CM | POA: Diagnosis present

## 2015-10-15 DIAGNOSIS — Z86718 Personal history of other venous thrombosis and embolism: Secondary | ICD-10-CM

## 2015-10-15 DIAGNOSIS — Z6820 Body mass index (BMI) 20.0-20.9, adult: Secondary | ICD-10-CM | POA: Diagnosis not present

## 2015-10-15 DIAGNOSIS — Z886 Allergy status to analgesic agent status: Secondary | ICD-10-CM

## 2015-10-15 DIAGNOSIS — Z923 Personal history of irradiation: Secondary | ICD-10-CM | POA: Diagnosis not present

## 2015-10-15 DIAGNOSIS — N1330 Unspecified hydronephrosis: Secondary | ICD-10-CM | POA: Diagnosis present

## 2015-10-15 DIAGNOSIS — R059 Cough, unspecified: Secondary | ICD-10-CM

## 2015-10-15 DIAGNOSIS — G8220 Paraplegia, unspecified: Secondary | ICD-10-CM | POA: Diagnosis present

## 2015-10-15 DIAGNOSIS — I82C11 Acute embolism and thrombosis of right internal jugular vein: Secondary | ICD-10-CM | POA: Diagnosis present

## 2015-10-15 DIAGNOSIS — D611 Drug-induced aplastic anemia: Secondary | ICD-10-CM | POA: Diagnosis not present

## 2015-10-15 DIAGNOSIS — C787 Secondary malignant neoplasm of liver and intrahepatic bile duct: Secondary | ICD-10-CM | POA: Diagnosis present

## 2015-10-15 DIAGNOSIS — Z7401 Bed confinement status: Secondary | ICD-10-CM | POA: Diagnosis not present

## 2015-10-15 DIAGNOSIS — C7800 Secondary malignant neoplasm of unspecified lung: Secondary | ICD-10-CM | POA: Diagnosis not present

## 2015-10-15 DIAGNOSIS — C9111 Chronic lymphocytic leukemia of B-cell type in remission: Secondary | ICD-10-CM | POA: Diagnosis present

## 2015-10-15 DIAGNOSIS — Z66 Do not resuscitate: Secondary | ICD-10-CM | POA: Diagnosis present

## 2015-10-15 DIAGNOSIS — R627 Adult failure to thrive: Secondary | ICD-10-CM | POA: Diagnosis present

## 2015-10-15 DIAGNOSIS — Z91048 Other nonmedicinal substance allergy status: Secondary | ICD-10-CM | POA: Diagnosis not present

## 2015-10-15 DIAGNOSIS — G8310 Monoplegia of lower limb affecting unspecified side: Secondary | ICD-10-CM | POA: Diagnosis present

## 2015-10-15 DIAGNOSIS — Z853 Personal history of malignant neoplasm of breast: Secondary | ICD-10-CM

## 2015-10-15 DIAGNOSIS — C7951 Secondary malignant neoplasm of bone: Secondary | ICD-10-CM | POA: Diagnosis present

## 2015-10-15 DIAGNOSIS — C50912 Malignant neoplasm of unspecified site of left female breast: Secondary | ICD-10-CM

## 2015-10-15 DIAGNOSIS — B370 Candidal stomatitis: Secondary | ICD-10-CM | POA: Diagnosis present

## 2015-10-15 DIAGNOSIS — D61810 Antineoplastic chemotherapy induced pancytopenia: Secondary | ICD-10-CM

## 2015-10-15 DIAGNOSIS — Z885 Allergy status to narcotic agent status: Secondary | ICD-10-CM

## 2015-10-15 DIAGNOSIS — I10 Essential (primary) hypertension: Secondary | ICD-10-CM | POA: Diagnosis present

## 2015-10-15 DIAGNOSIS — G9520 Unspecified cord compression: Secondary | ICD-10-CM | POA: Diagnosis present

## 2015-10-15 DIAGNOSIS — C7889 Secondary malignant neoplasm of other digestive organs: Secondary | ICD-10-CM | POA: Diagnosis present

## 2015-10-15 DIAGNOSIS — C50919 Malignant neoplasm of unspecified site of unspecified female breast: Secondary | ICD-10-CM | POA: Insufficient documentation

## 2015-10-15 DIAGNOSIS — C799 Secondary malignant neoplasm of unspecified site: Secondary | ICD-10-CM

## 2015-10-15 DIAGNOSIS — R338 Other retention of urine: Secondary | ICD-10-CM | POA: Diagnosis present

## 2015-10-15 DIAGNOSIS — T451X5A Adverse effect of antineoplastic and immunosuppressive drugs, initial encounter: Secondary | ICD-10-CM

## 2015-10-15 DIAGNOSIS — R29898 Other symptoms and signs involving the musculoskeletal system: Secondary | ICD-10-CM

## 2015-10-15 LAB — CBC WITH DIFFERENTIAL/PLATELET
BASO%: 0.2 % (ref 0.0–2.0)
BASOS ABS: 0 10*3/uL (ref 0.0–0.1)
BASOS PCT: 0 %
Basophils Absolute: 0 10*3/uL (ref 0.0–0.1)
EOS ABS: 0 10*3/uL (ref 0.0–0.5)
EOS ABS: 0 10*3/uL (ref 0.0–0.7)
EOS PCT: 0 %
EOS%: 0.4 % (ref 0.0–7.0)
HCT: 25 % — ABNORMAL LOW (ref 34.8–46.6)
HCT: 22.3 % — ABNORMAL LOW (ref 36.0–46.0)
HEMOGLOBIN: 7 g/dL — AB (ref 12.0–15.0)
HGB: 7.7 g/dL — ABNORMAL LOW (ref 11.6–15.9)
LYMPH%: 1.6 % — AB (ref 14.0–49.7)
LYMPHS PCT: 6 %
Lymphs Abs: 0.4 10*3/uL — ABNORMAL LOW (ref 0.7–4.0)
MCH: 25.1 pg (ref 25.1–34.0)
MCH: 25.9 pg — AB (ref 26.0–34.0)
MCHC: 30.9 g/dL — AB (ref 31.5–36.0)
MCHC: 31.4 g/dL (ref 30.0–36.0)
MCV: 81.1 fL (ref 79.5–101.0)
MCV: 82.6 fL (ref 78.0–100.0)
MONO ABS: 0.4 10*3/uL (ref 0.1–1.0)
MONO#: 0.6 10*3/uL (ref 0.1–0.9)
MONO%: 9.9 % (ref 0.0–14.0)
Monocytes Relative: 6 %
NEUT#: 5.2 10*3/uL (ref 1.5–6.5)
NEUT%: 87.9 % — AB (ref 38.4–76.8)
NEUTROS PCT: 88 %
Neutro Abs: 5.4 10*3/uL (ref 1.7–7.7)
PLATELETS: 118 10*3/uL — AB (ref 150–400)
Platelets: 127 10*3/uL — ABNORMAL LOW (ref 145–400)
RBC: 3.08 10*6/uL — AB (ref 3.70–5.45)
RBC: 2.7 MIL/uL — AB (ref 3.87–5.11)
RDW: 18.9 % — AB (ref 11.2–14.5)
RDW: 18.3 % — AB (ref 11.5–15.5)
WBC: 5.9 10*3/uL (ref 3.9–10.3)
WBC: 6.2 10*3/uL (ref 4.0–10.5)
lymph#: 0.1 10*3/uL — ABNORMAL LOW (ref 0.9–3.3)

## 2015-10-15 LAB — URINALYSIS, ROUTINE W REFLEX MICROSCOPIC
Bilirubin Urine: NEGATIVE
Glucose, UA: NEGATIVE mg/dL
KETONES UR: NEGATIVE mg/dL
LEUKOCYTES UA: NEGATIVE
NITRITE: NEGATIVE
PROTEIN: NEGATIVE mg/dL
Specific Gravity, Urine: 1.02 (ref 1.005–1.030)
pH: 5.5 (ref 5.0–8.0)

## 2015-10-15 LAB — COMPREHENSIVE METABOLIC PANEL
ALT: 71 U/L — ABNORMAL HIGH (ref 14–54)
ANION GAP: 9 (ref 5–15)
AST: 61 U/L — ABNORMAL HIGH (ref 15–41)
Albumin: 2.1 g/dL — ABNORMAL LOW (ref 3.5–5.0)
Alkaline Phosphatase: 168 U/L — ABNORMAL HIGH (ref 38–126)
BUN: 36 mg/dL — ABNORMAL HIGH (ref 6–20)
CHLORIDE: 108 mmol/L (ref 101–111)
CO2: 20 mmol/L — AB (ref 22–32)
CREATININE: 0.83 mg/dL (ref 0.44–1.00)
Calcium: 9 mg/dL (ref 8.9–10.3)
Glucose, Bld: 151 mg/dL — ABNORMAL HIGH (ref 65–99)
Potassium: 4.1 mmol/L (ref 3.5–5.1)
SODIUM: 137 mmol/L (ref 135–145)
Total Bilirubin: 0.4 mg/dL (ref 0.3–1.2)
Total Protein: 4.9 g/dL — ABNORMAL LOW (ref 6.5–8.1)

## 2015-10-15 LAB — PREPARE RBC (CROSSMATCH)

## 2015-10-15 LAB — URINE MICROSCOPIC-ADD ON: BACTERIA UA: NONE SEEN

## 2015-10-15 LAB — TECHNOLOGIST REVIEW

## 2015-10-15 LAB — HOLD TUBE, BLOOD BANK

## 2015-10-15 LAB — CANCER ANTIGEN 27.29: CA 27.29: 172 U/mL — AB (ref 0–39)

## 2015-10-15 MED ORDER — NYSTATIN 100000 UNIT/ML MT SUSP
5.0000 mL | Freq: Four times a day (QID) | OROMUCOSAL | Status: DC
  Administered 2015-10-15 – 2015-10-18 (×12): 500000 [IU] via OROMUCOSAL
  Filled 2015-10-15 (×11): qty 5

## 2015-10-15 MED ORDER — GADOBENATE DIMEGLUMINE 529 MG/ML IV SOLN
10.0000 mL | Freq: Once | INTRAVENOUS | Status: AC | PRN
  Administered 2015-10-15: 10 mL via INTRAVENOUS

## 2015-10-15 MED ORDER — DEXAMETHASONE SODIUM PHOSPHATE 10 MG/ML IJ SOLN
10.0000 mg | Freq: Once | INTRAMUSCULAR | Status: AC
  Administered 2015-10-15: 10 mg via INTRAVENOUS
  Filled 2015-10-15: qty 1

## 2015-10-15 MED ORDER — ACETAMINOPHEN 325 MG PO TABS
650.0000 mg | ORAL_TABLET | Freq: Four times a day (QID) | ORAL | Status: DC | PRN
  Administered 2015-10-16: 650 mg via ORAL
  Filled 2015-10-15: qty 2

## 2015-10-15 MED ORDER — BOOST / RESOURCE BREEZE PO LIQD
1.0000 | Freq: Three times a day (TID) | ORAL | Status: DC
  Administered 2015-10-16 – 2015-10-17 (×5): 1 via ORAL
  Administered 2015-10-17: 20:00:00 via ORAL
  Administered 2015-10-18 (×2): 1 via ORAL

## 2015-10-15 MED ORDER — ACETAMINOPHEN 650 MG RE SUPP: 650.0000 mg | Freq: Four times a day (QID) | RECTAL | Status: DC | PRN

## 2015-10-15 MED ORDER — PRO-STAT SUGAR FREE PO LIQD
30.0000 mL | Freq: Three times a day (TID) | ORAL | Status: DC
  Administered 2015-10-16 – 2015-10-19 (×9): 30 mL via ORAL
  Filled 2015-10-15 (×8): qty 30

## 2015-10-15 MED ORDER — DIPHENHYDRAMINE HCL 25 MG PO CAPS
25.0000 mg | ORAL_CAPSULE | Freq: Once | ORAL | Status: AC
  Administered 2015-10-15: 25 mg via ORAL
  Filled 2015-10-15: qty 1

## 2015-10-15 MED ORDER — SODIUM CHLORIDE 0.9 % IV SOLN
INTRAVENOUS | Status: AC
  Administered 2015-10-15: 23:00:00 via INTRAVENOUS

## 2015-10-15 MED ORDER — ONDANSETRON HCL 4 MG/2ML IJ SOLN: 4.0000 mg | Freq: Four times a day (QID) | INTRAMUSCULAR | Status: DC | PRN

## 2015-10-15 MED ORDER — ACETAMINOPHEN 325 MG PO TABS
650.0000 mg | ORAL_TABLET | Freq: Once | ORAL | Status: AC
  Administered 2015-10-15: 650 mg via ORAL
  Filled 2015-10-15: qty 2

## 2015-10-15 MED ORDER — IOHEXOL 300 MG/ML  SOLN
75.0000 mL | Freq: Once | INTRAMUSCULAR | Status: AC | PRN
  Administered 2015-10-15: 75 mL via INTRAVENOUS

## 2015-10-15 MED ORDER — SODIUM CHLORIDE 0.9 % IV SOLN
Freq: Once | INTRAVENOUS | Status: AC
  Administered 2015-10-15: via INTRAVENOUS

## 2015-10-15 MED ORDER — ONDANSETRON HCL 4 MG PO TABS: 4.0000 mg | ORAL_TABLET | Freq: Four times a day (QID) | ORAL | Status: DC | PRN

## 2015-10-15 MED ORDER — DEXAMETHASONE SODIUM PHOSPHATE 4 MG/ML IJ SOLN
6.0000 mg | Freq: Four times a day (QID) | INTRAMUSCULAR | Status: DC
Start: 2015-10-15 — End: 2015-10-19
  Administered 2015-10-15 – 2015-10-19 (×14): 6 mg via INTRAVENOUS
  Filled 2015-10-15 (×14): qty 2

## 2015-10-15 MED ORDER — HYDROCODONE-ACETAMINOPHEN 5-325 MG PO TABS
1.0000 | ORAL_TABLET | Freq: Four times a day (QID) | ORAL | Status: DC | PRN
  Administered 2015-10-16 – 2015-10-17 (×2): 1 via ORAL
  Filled 2015-10-15 (×2): qty 1

## 2015-10-15 NOTE — H&P (Addendum)
PCP:   [name], MD  Cyndra Numbers  Referring provider [name]   Chief Complaint:  Unable to walk  HPI: Helen [name] is a 80 y.o. female   has a past medical history of Arthritis; Blood transfusion (2009); Hypertension; Seizures ([place]) (1980's); radiation therapy (09/11/08 -10/31/08); History of radiation therapy (eot 07/30/12); Cough (10/10/2014); Breast CA ([place]) (08/2008); Lymphoma ([place]) (09/09/2011); Leukemia, acute, in remission ([place]) (2012); Metastasis to bone ([place]) (07/03/2012); Clicking tinnitus of left ear (01/02/2015); Diarrhea (02/06/2015); Pneumonia due to other specified infectious organisms (04/03/2015); Hypokalemia (04/17/2015); Right leg DVT ([place]) (05/29/2015); and S/P radiation therapy (06/05/15-06/18/15).   Presented with inability to walk for the past 3 weeks but for the past 3 days she had progresion of her weakness unable to lift her legs and reports being paralyzed from waist down with decreased sensationin lower extremeties. Patient  Presented to oncology who ordered MRI showing progression of T5 level metastasis now with cord compression and edema. There is also epidural tumor at level TX and L3. On evidence of bladder distention with bilateral hydronephrosis increase in size and splenic metastasis.. Patient also reports swelling in the left side of her face and neck which she thought was secondary to tooth infection. This appears to be not painful she denies any history of fevers although occasionally has some chills. She has been having consistent cough has been going on for quite a few months. Of note patient does have known history of pulmonary metastases. ER provider has discussed case with radiation oncology who will see patient in consult tomorrow for palliative intervention.   Of note patient describes incontinence and had been having breakdown on her sacral area. Seems it has been going on for few weeks as well.   Of note patient has known history of CLL and lymphoma as  well as metastatic breast cancer currently no longer an candidate for aggressive intervention as per oncology supportive care only.  Hospitalist was called for admission for cord compression.   Review of Systems:    Pertinent positives include: chills, fatigue, pain in her mouth, cough, swelling of the nodes around her neck.   Constitutional:  No weight loss, night sweats, Fevers,  weight loss  HEENT:  No headaches, Difficulty swallowing,Tooth/dental problems,Sore throat,  No sneezing, itching, ear ache, nasal congestion, post nasal drip,  Cardio-vascular:  No chest pain, Orthopnea, PND, anasarca, dizziness, palpitations.no Bilateral lower extremity swelling  GI:  No heartburn, indigestion, abdominal pain, nausea, vomiting, diarrhea, change in bowel habits, loss of appetite, melena, blood in stool, hematemesis Resp:  no shortness of breath at rest. No dyspnea on exertion, No excess mucus, no productive cough,   No coughing up of blood.No change in color of mucus.No wheezing. Skin:  no rash or lesions. No jaundice GU:  no dysuria, change in color of urine, no urgency or frequency. No straining to urinate.  No flank pain.  Musculoskeletal:  No joint pain or no joint swelling. No decreased range of motion. No back pain.  Psych:  No change in mood or affect. No depression or anxiety. No memory loss.  Neuro: no localizing neurological complaints, no tingling, no weakness, no double vision, no gait abnormality, no slurred speech, no confusion  Otherwise ROS are negative except for above, 10 systems were reviewed  Past Medical History: Past Medical History  Diagnosis Date  . Arthritis   . Blood transfusion 2009  . Hypertension   . Seizures ([place]) 1980's    from medication reaction/Pt.  [name] Hx  of radiation therapy 09/11/08 -10/31/08    left breast  . History of radiation therapy eot 07/30/12    lumbar spine L4 /l shoulder  . Cough 10/10/2014  . Breast CA ([place]) 08/2008    (LT) breast  ca dx 10/09/Chemo  . Lymphoma ([place]) 09/09/2011    NHL  . Leukemia, acute, in remission ([place]) 2012    Pt. not sure of type  . Metastasis to bone ([place]) 07/03/2012    MRI L spine  . Clicking tinnitus of left ear 01/02/2015  . Diarrhea 02/06/2015  . Pneumonia due to other specified infectious organisms 04/03/2015  . Hypokalemia 04/17/2015  . Right leg DVT ([place]) 05/29/2015  . S/P radiation therapy 06/05/15-06/18/15    right neck 25Gy/72fx   Past Surgical History  Procedure Laterality Date  . Mastectomy    . Thyroidectomy  1965  . Appendectomy    . Tonsillectomy       Medications: Prior to Admission medications   Medication Sig Start Date End Date Taking? Authorizing Provider  Amino Acids-Protein Hydrolys (FEEDING SUPPLEMENT, PRO-STAT SUGAR FREE 64,) LIQD Take 30 mLs by mouth 3 (three) times daily with meals.   Yes Historical Provider, MD  feeding supplement (BOOST / RESOURCE BREEZE) LIQD Take 1 Container by mouth 3 (three) times daily between meals. 10/02/15  Yes Verlee Monte, MD  HYDROcodone-acetaminophen (NORCO) 5-325 MG tablet Take 1 tablet by mouth every 6 (six) hours as needed for moderate pain. 10/02/15  Yes Verlee Monte, MD  nystatin (MYCOSTATIN) 100000 UNIT/ML suspension Use as directed 5 mLs in the mouth or throat 4 (four) times daily.   Yes Historical Provider, MD  predniSONE (DELTASONE) 10 MG tablet Take 1 tablet (10 mg total) by mouth daily with breakfast. 09/24/15  Yes [name], MD  predniSONE (DELTASONE) 20 MG tablet Take 3 tablets (60 mg total) by mouth daily. Take on days 2-5 of chemotherapy. Patient not taking: Reported on 09/24/2015 08/13/15   [name], MD    Allergies:   Allergies  Allergen Reactions  . Codeine Other (See Comments)    Reaction: seizures  . Penicillins Other (See Comments)    Reaction: Seizures Has patient had a PCN reaction causing immediate rash, facial/tongue/throat swelling, SOB or lightheadedness with hypotension:  Has patient had a PCN reaction  causing severe rash involving mucus membranes or skin necrosis: no Has patient had a PCN reaction that required hospitalization Yes Has patient had a PCN reaction occurring within the last 10 years: No If all of the above answers are "NO", then may proceed with Cephalosporin use.   . Sulfonamide Derivatives Itching  . Tape Rash  . Tramadol Hcl Rash    Social History:    bed bound for the past 3 weeks   From facility [facility]   reports that she has never smoked. She has never used smokeless tobacco. She reports that she does not drink alcohol or use illicit drugs.    Family History: family history includes Alcohol abuse in her other; Breast cancer in her mother; Cancer in her father; Colon cancer in her brother; Drug abuse in her other; Prostate cancer in her father. There is no history of Esophageal cancer, Rectal cancer, or Stomach cancer.    Physical Exam: Patient Vitals for the past 24 hrs:  BP Temp Temp src Pulse Resp SpO2 Height Weight  10/15/15 1842 114/57 mmHg 98.2 F (36.8 C) Oral 116 16 100 % - -  10/15/15 1830 (!) 114/54 mmHg - - - - - - -  10/15/15 1815 112/64 mmHg - - - - - - -  10/15/15 1801 (!) 123/54 mmHg - - 120 14 100 % - -  10/15/15 1800 (!) 123/54 mmHg - - - - - - -  10/15/15 1700 119/59 mmHg - - - - - - -  10/15/15 1645 113/76 mmHg - - - - - - -  10/15/15 1630 111/63 mmHg - - 120 - 99 % - -  10/15/15 1629 111/63 mmHg - - 120 16 100 % - -  10/15/15 1615 109/69 mmHg - - (!) 123 - 99 % - -  10/15/15 1600 112/73 mmHg - - (!) 124 - 97 % - -  10/15/15 1554 115/93 mmHg - - (!) 124 18 97 % - -  10/15/15 1345 (!) 103/51 mmHg - - 102 - 100 % - -  10/15/15 1330 (!) 98/53 mmHg - - 102 - 100 % - -  10/15/15 1316 (!) 98/53 mmHg - - 102 16 100 % - -  10/15/15 1315 (!) 98/53 mmHg - - 101 - 100 % - -  10/15/15 1300 (!) 105/52 mmHg - - 94 - 100 % - -  10/15/15 1245 (!) 106/50 mmHg - - 103 - 100 % - -  10/15/15 1230 (!) 92/51 mmHg - - 103 - 100 % - -  10/15/15 1215  (!) 97/54 mmHg - - 105 - 100 % - -  10/15/15 1145 (!) 91/47 mmHg - - 108 18 99 % - -  10/15/15 1130 - - - 114 - 100 % - -  10/15/15 1126 (!) 93/47 mmHg - - 108 - 100 % - -  10/15/15 1100 (!) 98/52 mmHg - - 109 18 100 % - -  10/15/15 1046 (!) 94/52 mmHg - - 110 18 100 % - -  10/15/15 1040 (!) 90/50 mmHg 97.4 F (36.3 C) Oral 114 18 100 % 5\' 2"  (1.575 m) 51.71 kg (114 lb)    1. General:  in No Acute distress 2. Psychological: Alert and   Oriented 3. Head/ENT:    Dry Mucous Membranes, gum swelling noted                          Head Non traumatic, neck supple, Left side of the neck with 3 massess non tender measuring                   about 3 cm                           Poor Dentition,  4. SKIN:  decreased Skin turgor,  Skin clean Dry,  sacral decubitus  5. Heart: Regular rate and rhythm no Murmur, Rub or gallop 6. Lungs:  no wheezes some  crackles   7. Abdomen: Soft, non-tender, Non distended 8. Lower extremities: no clubbing, cyanosis, or edema 9. Neurologically decreased strength in lower extremity bilaterally dense paralysis decrease sensation 10. MSK: Normal range of motion  body mass index is 20.85 kg/(m^2).   Labs on Admission:   Results for orders placed or performed during the hospital encounter of 10/15/15 (from the past 24 hour(s))  Comprehensive metabolic panel     Status: Abnormal   Collection Time: 10/15/15 12:22 PM  Result Value Ref Range   Sodium 137 135 - 145 mmol/L   Potassium 4.1 3.5 - 5.1 mmol/L   Chloride 108 101 - 111 mmol/L   CO2  20 (L) 22 - 32 mmol/L   Glucose, Bld 151 (H) 65 - 99 mg/dL   BUN 36 (H) 6 - 20 mg/dL   Creatinine, Ser 0.83 0.44 - 1.00 mg/dL   Calcium 9.0 8.9 - 10.3 mg/dL   Total Protein 4.9 (L) 6.5 - 8.1 g/dL   Albumin 2.1 (L) 3.5 - 5.0 g/dL   AST 61 (H) 15 - 41 U/L   ALT 71 (H) 14 - 54 U/L   Alkaline Phosphatase 168 (H) 38 - 126 U/L   Total Bilirubin 0.4 0.3 - 1.2 mg/dL   GFR calc non Af Amer >60 >60 mL/min   GFR calc Af Amer >60 >60  mL/min   Anion gap 9 5 - 15  CBC with Differential     Status: Abnormal   Collection Time: 10/15/15 12:22 PM  Result Value Ref Range   WBC 6.2 4.0 - 10.5 K/uL   RBC 2.70 (L) 3.87 - 5.11 MIL/uL   Hemoglobin 7.0 (L) 12.0 - 15.0 g/dL   HCT 22.3 (L) 36.0 - 46.0 %   MCV 82.6 78.0 - 100.0 fL   MCH 25.9 (L) 26.0 - 34.0 pg   MCHC 31.4 30.0 - 36.0 g/dL   RDW 18.3 (H) 11.5 - 15.5 %   Platelets 118 (L) 150 - 400 K/uL   Neutrophils Relative % 88 %   Lymphocytes Relative 6 %   Monocytes Relative 6 %   Eosinophils Relative 0 %   Basophils Relative 0 %   Neutro Abs 5.4 1.7 - 7.7 K/uL   Lymphs Abs 0.4 (L) 0.7 - 4.0 K/uL   Monocytes Absolute 0.4 0.1 - 1.0 K/uL   Eosinophils Absolute 0.0 0.0 - 0.7 K/uL   Basophils Absolute 0.0 0.0 - 0.1 K/uL   WBC Morphology MILD LEFT SHIFT (1-5% METAS, OCC MYELO, OCC BANDS)    *Note: Due to a large number of results and/or encounters for the requested time period, some results have not been displayed. A complete set of results can be found in Results Review.    UA not obtained  Lab Results  Component Value Date   HGBA1C 6.2 02/10/2012    Estimated Creatinine Clearance: 44.2 mL/min (by C-G formula based on Cr of 0.83).  BNP (last 3 results) No results for input(s): PROBNP in the last 8760 hours.  Other results:  I have pearsonaly reviewed this: ECG REPORT Not obtained   Filed Weights   10/15/15 1040  Weight: 51.71 kg (114 lb)     Cultures:    Component Value Date/Time   SDES BLOOD LEFT HAND 08/30/2008 0420   SPECREQUEST BOTTLES DRAWN AEROBIC AND ANAEROBIC 4CC 08/30/2008 0420   CULT NO GROWTH 5 DAYS 08/30/2008 0420   REPTSTATUS 09/05/2008 FINAL 08/30/2008 0420     Radiological Exams on Admission: Mr Thoracic Spine W Wo Contrast  10/15/2015  CLINICAL DATA:  Metastatic breast cancer. Numbness and paralysis from the day waist down beginning 3 or 4 days ago. EXAM: MRI THORACIC AND LUMBAR SPINE WITHOUT AND WITH CONTRAST TECHNIQUE: Multiplanar  and multiecho pulse sequences of the thoracic and lumbar spine were obtained without and with intravenous contrast. CONTRAST:  37mL MULTIHANCE GADOBENATE DIMEGLUMINE 529 MG/ML IV SOLN COMPARISON:  Total spine MRI 08/11/2014. CT chest, abdomen, and pelvis 09/21/2015. FINDINGS: MR THORACIC SPINE FINDINGS There are multiple new osseous metastases involving vertebral bodies and posterior elements throughout the thoracic spine compared to the prior MRI. The T5 vertebral body is completely involved by tumor with extension to  the posterior elements. There is prominent paravertebral soft tissue tumor bilaterally extending from T4-T6. There is extensive epidural tumor extending from T3 to T6-7, bulkiest from T4-T6 where there is circumferential tumor resulting in severe spinal stenosis. The spinal cord appears markedly edematous through this region, with edema extending cephalad to T1-2 and caudally to T8. The T10 vertebral body is now diffusely involved by tumor. There is minimal ventral epidural tumor at this level without associated spinal stenosis. There is a small right pleural effusion. Subcarinal nodal mass and patchy areas of abnormal signal throughout both lungs are better evaluated on last month's chest CT. MR LUMBAR SPINE FINDINGS Transitional lumbosacral anatomy with numbering preserved from the prior study. L3 vertebral body lesion has mildly increased in size. There is a new lesion involving much of the left half of the L4 vertebral body. There is a new approximately 2 cm lesion in the left superior aspect of L5. There are new sacral lesions with multiple iliac bone lesions partially visualized. Trace ventral epidural tumor at L3 may have minimally increased from the prior MRI. No definite epidural tumor is identified elsewhere in the lumbar spine. Chronic anterolisthesis is again seen of L4 on L5 measuring 12 mm, not significantly changed. There is mild-to-moderate bilateral neural foraminal stenosis at L4-5.  Slight anterolisthesis is again seen of L3 on L4 with disc uncovering and posterior element hypertrophy and fusion resulting in moderate to severe spinal stenosis, similar to prior. There is marked bladder distention, incompletely visualized with moderate right greater than left hydronephrosis. Bilateral renal cysts are again noted. A 2.1 cm lesion in the spleen has increased in size from the prior CT (previously 1.5 cm). Liver metastases are again partially visualized. Gallstones are partially visualized. IMPRESSION: 1. Interval progression of osseous spinal metastases, greatest in the thoracic spine. 2. Completely replaced T5 vertebral body with extensive epidural tumor resulting in severe spinal stenosis and extensive cord edema. Cord edema is felt to be due to epidural tumor and compression without definite intramedullary tumor identified though difficult to completely exclude. 3. Minimal epidural tumor at T10 without stenosis. 4. Minimal epidural tumor at L3, at most minimally increased from the prior MRI. No stenosis. 5. Marked bladder distention with moderate bilateral hydronephrosis. 6. Increased size of splenic metastasis. Critical Value/emergent results were called by telephone at the time of interpretation on 10/15/2015 at 4:29 pm to Dr. Quintella Reichert , who verbally acknowledged these results. Electronically Signed   By: Logan Bores M.D.   On: 10/15/2015 16:32   Mr Lumbar Spine W Wo Contrast  10/15/2015  CLINICAL DATA:  Metastatic breast cancer. Numbness and paralysis from the day waist down beginning 3 or 4 days ago. EXAM: MRI THORACIC AND LUMBAR SPINE WITHOUT AND WITH CONTRAST TECHNIQUE: Multiplanar and multiecho pulse sequences of the thoracic and lumbar spine were obtained without and with intravenous contrast. CONTRAST:  60mL MULTIHANCE GADOBENATE DIMEGLUMINE 529 MG/ML IV SOLN COMPARISON:  Total spine MRI 08/11/2014. CT chest, abdomen, and pelvis 09/21/2015. FINDINGS: MR THORACIC SPINE FINDINGS  There are multiple new osseous metastases involving vertebral bodies and posterior elements throughout the thoracic spine compared to the prior MRI. The T5 vertebral body is completely involved by tumor with extension to the posterior elements. There is prominent paravertebral soft tissue tumor bilaterally extending from T4-T6. There is extensive epidural tumor extending from T3 to T6-7, bulkiest from T4-T6 where there is circumferential tumor resulting in severe spinal stenosis. The spinal cord appears markedly edematous through this region, with edema  extending cephalad to T1-2 and caudally to T8. The T10 vertebral body is now diffusely involved by tumor. There is minimal ventral epidural tumor at this level without associated spinal stenosis. There is a small right pleural effusion. Subcarinal nodal mass and patchy areas of abnormal signal throughout both lungs are better evaluated on last month's chest CT. MR LUMBAR SPINE FINDINGS Transitional lumbosacral anatomy with numbering preserved from the prior study. L3 vertebral body lesion has mildly increased in size. There is a new lesion involving much of the left half of the L4 vertebral body. There is a new approximately 2 cm lesion in the left superior aspect of L5. There are new sacral lesions with multiple iliac bone lesions partially visualized. Trace ventral epidural tumor at L3 may have minimally increased from the prior MRI. No definite epidural tumor is identified elsewhere in the lumbar spine. Chronic anterolisthesis is again seen of L4 on L5 measuring 12 mm, not significantly changed. There is mild-to-moderate bilateral neural foraminal stenosis at L4-5. Slight anterolisthesis is again seen of L3 on L4 with disc uncovering and posterior element hypertrophy and fusion resulting in moderate to severe spinal stenosis, similar to prior. There is marked bladder distention, incompletely visualized with moderate right greater than left hydronephrosis.  Bilateral renal cysts are again noted. A 2.1 cm lesion in the spleen has increased in size from the prior CT (previously 1.5 cm). Liver metastases are again partially visualized. Gallstones are partially visualized. IMPRESSION: 1. Interval progression of osseous spinal metastases, greatest in the thoracic spine. 2. Completely replaced T5 vertebral body with extensive epidural tumor resulting in severe spinal stenosis and extensive cord edema. Cord edema is felt to be due to epidural tumor and compression without definite intramedullary tumor identified though difficult to completely exclude. 3. Minimal epidural tumor at T10 without stenosis. 4. Minimal epidural tumor at L3, at most minimally increased from the prior MRI. No stenosis. 5. Marked bladder distention with moderate bilateral hydronephrosis. 6. Increased size of splenic metastasis. Critical Value/emergent results were called by telephone at the time of interpretation on 10/15/2015 at 4:29 pm to Dr. Quintella Reichert , who verbally acknowledged these results. Electronically Signed   By: Logan Bores M.D.   On: 10/15/2015 16:32    Chart has been reviewed  Family   at  Bedside  plan of care was discussed with   Daughter Thurmon Fair [phone]  Assessment/Plan  80 yo F with With hx of CLL and breast cancer with metastasis to the bone here with spinal cord compression with lower extremity paralysis.    Present on Admission:  . Cord compression syndrome [facility]) - discussed case with oncology as well as radiation oncology. Will start on Decadron IV. Defer to radiation oncology regarding palliative treatment  . Pulmonary metastases ([place])- patient have been having cough and chills will obtain CXR to eval for post obstructive pneumonia . Paralysis of both lower limbs ([place]) - most likely secondary to cord compression. OCPs any improvement with steroids IV and possibly radiation therapy  . Lymphoma, small lymphocytic ([place]) suspected lesions on the face  and neck are likely lymphoma patient in the past was told that this may due to teeth  infection noted to be nontender make it somewhat less likely. Given she also obtain CT of the neck to evaluate for any source of infection  . Cancer of left breast ([place]) with metastasis to the spine liver and spleen. Supportive management case has been discussed with hematology oncology  . Anemia  symptomatic anemia we'll transfuse 1 unit and follow   urinary retention distended bladder MRI showing evidence of hydronephrosis. We'll place Foley worrisome for urinary retention   Prophylaxis: SCD   CODE STATUS:   DNR/DNI as per patient  patient agreeable to palliative care consult. Per review of records in the past this has been brought up oncology as well. Will order palliative care consult appreciated input  Disposition:                            Back to current facility when stable            Other plan as per orders.  I have spent a total of 65 min on this admission extra time was taken to discuss case with hematology oncology Dr. Gerome Sam 10/15/2015, 6:52 PM  [facility]  Pager [phone]   after 2 AM please page floor coverage PA If 7AM-7PM, please contact the day team taking care of the patient  [email or web address]  Password [email or web address]

## 2015-10-15 NOTE — ED Notes (Signed)
Pt still in MRI 

## 2015-10-15 NOTE — ED Notes (Signed)
Patient transported to CT 

## 2015-10-15 NOTE — ED Notes (Signed)
Bed: [id number] Expected date:  Expected time:  Means of arrival:  Comments: Pt from [facility] r/o cord compression

## 2015-10-15 NOTE — ED Notes (Signed)
Phlebotomy at bedside.

## 2015-10-15 NOTE — ED Provider Notes (Signed)
Patient signed out to me by Dr. Ralene Bathe and patient's results of MRI were reviewed with the on-call radiation oncologist. They recommended patient be admitted to the medicine service and they will see the patient the morning.  Lacretia Leigh, MD 10/15/15 [phone]

## 2015-10-15 NOTE — Progress Notes (Signed)
Unable to reach ED RN for report, Art Secretary aware, will retry if she does not return call soon. Fara Olden P

## 2015-10-15 NOTE — Progress Notes (Signed)
Utilization Review completed.  Dineen Conradt RN CM  

## 2015-10-15 NOTE — ED Notes (Signed)
Per Dr. Ralene Bathe, nurse should access port.

## 2015-10-15 NOTE — ED Notes (Signed)
Helen [name] reports unsuccessful IV attempt. Current/1141 IV successful but unable to draw sufficient amount of blood for labs ordered.

## 2015-10-15 NOTE — Progress Notes (Signed)
[place] OFFICE PROGRESS NOTE  Patient Care Team: [name], MD as PCP - General (Hematology and Oncology) [name], MD as Consulting Physician (Hematology and Oncology)  SUMMARY OF ONCOLOGIC HISTORY:   Cancer of left breast ([place])   08/03/2008 Initial Diagnosis she presented with fungating mass and was found to have concurrent CLL/SLL   08/30/2008 Imaging CT scan staging showed right iliac bone osseous metastasis and borderline right pelvic adenopathy   08/31/2008 Pathology Results Case #: [id number] BIopsy of left breast mass showed breast ca   09/12/2008 Imaging CT: Large ulcerative mass within the left breast is consistent with primary breast ca. There is evidence of extensive, widespread metastatic disease.Specifically, there are hypermetabolic lymph node metastasis within the left axilla, mediastinum and bone   09/23/2008 - 11/23/2010 Chemotherapy She had received neoadjuvant chemotherapy for breast cancer with letrozole followed by mastectomy and R-CVP for lymphoma   01/22/2009 Imaging CT showed response to chest w no evidence of extra osseous metastasis within the pelvis and increased sclerosis within a right iliac wing metastasis (both since the prior CT and PET.)   03/21/2009 Pathology Results [phone] left mastectomy specimen showed residual 6 cm mass, ER positive,. HER2 neg, Nottingham grade 3   03/21/2009 Surgery She had left mastectomy   09/07/2009 Imaging Interval response to therapy. Specifically, there has been improvement in mediastinal and right hilar adenopathy.   01/21/2011 Imaging CT showed new upper abdominal mesenteric lymphadenopathy. Findings are suspicious for metastatic disease.2. New pelvic lymphadenopathy, left greater than right, is suspicious for metastatic involvement.   02/03/2011 Pathology Results [phone] LN biopsy showed SLL   03/11/2011 Bone Marrow Biopsy [id number] BM biopsy showed CLL   05/13/2011 Imaging Response to therapy of pelvic  adenopathy. No evidence of residual active lymphoma.   07/22/2011 Imaging Ct showed No evidence of lymphoma recurrence within the abdomen or pelvis.   11/26/2011 - 07/08/2012 Chemotherapy She had weekly Rituxan x 4 every 6 months   06/01/2012 Imaging Ct showed Interval development of abdominal and left pelvic adenopathy most consistent with lymphoma as correlated with the chest findings.   08/12/2012 - 12/31/2012 Chemotherapy She had 6 cycles of bendamustine and rituxan   11/12/2012 Imaging Ct showed Response to therapy of pelvic adenopathy   01/03/2013 Imaging Ct showed no new or progressive neoplasm within the abdomen pelvis. 2. Stable appearance of lytic bone metastases.   01/28/2013 - 02/14/2014 Chemotherapy She received monthly rituxan   07/22/2013 Imaging Ct showed no evidence of lymphoma recurrence within the thorax.   02/20/2014 Imaging CT showed right axillary and right juxta diaphragmatic lymphadenopathy,new/increased, suggesting lymphomatous recurrence.Progressive pleural-based nodularity in the right hemithorax, alsoworrisome for recurrence.    04/03/2014 Pathology Results Accession: [id number] BIopsy showed SLL   04/03/2014 Procedure She had axillary LN biopsy   04/18/2014 - 07/11/2014 Chemotherapy She received Dyann Kief   08/04/2014 Imaging Mild increase in bilateral pulmonary metastases and liver metastases.Mixed response of thoracic adenopathy, with decreased right axillary lymphadenopathy   08/15/2014 - 01/17/2015 Chemotherapy She received Faslodex   11/06/2014 Imaging Repeat CT scan of the chest, abdomen and pelvis show regression in the size of liver metastasis and pulmonary metastasis with mild progression of lymphadenopathy   11/06/2014 Tumor Marker [id number] is elevated at 275   01/18/2015 Imaging MRI head is negative for metastatic disease   01/18/2015 Imaging CT scan of the chest, abdomen and pelvis show significant disease progression throughout.   01/25/2015 - 05/15/2015 Chemotherapy She started Gemzar  every other week and Ibrutinib. Treatment  is discontinued due to progression   02/20/2015 Tumor Marker CA 27-29 at 246   03/30/2015 Imaging Repeat CT scan of the chest, abdomen and pelvis show positive response to treatment for lymphoma. Liver lesions are stable.   06/05/2015 - 06/18/2015 Radiation Therapy  she completed radiation treatment to the right supraclavicular region   07/23/2015 - 09/03/2015 Chemotherapy She received 3 cycles of RCHOP for SLL & breast ca   09/03/2015 Tumor Marker CA 27-29 at 137   09/21/2015 Imaging CT scan showed mixed response    INTERVAL HISTORY: Please see below for problem oriented charting. She returns for further follow-up. According to the son, she is getting weaker with diffuse bilateral leg swelling to the point she cannot move. When I questioned the patient, she states she is not able to feel both her legs over the past week and unable to move. She denies constipation. She have urinary incontinence  REVIEW OF SYSTEMS:   Constitutional: Denies fevers, chills or abnormal weight loss Eyes: Denies blurriness of vision Ears, nose, mouth, throat, and face: Denies mucositis or sore throat Respiratory: Denies cough, dyspnea or wheezes Cardiovascular: Denies palpitation, chest discomfort Gastrointestinal:  Denies nausea, heartburn or change in bowel habits Skin: Denies abnormal skin rashes Lymphatics: Denies new lymphadenopathy or easy bruising Behavioral/Psych: Mood is stable, no new changes  All other systems were reviewed with the patient and are negative.  I have reviewed the past medical history, past surgical history, social history and family history with the patient and they are unchanged from previous note.  ALLERGIES:  is allergic to codeine; penicillins; sulfonamide derivatives; tape; and tramadol hcl.  MEDICATIONS:  Current Outpatient Prescriptions  Medication Sig Dispense Refill  . feeding supplement (BOOST / RESOURCE BREEZE) LIQD Take 1  Container by mouth 3 (three) times daily between meals.  0  . HYDROcodone-acetaminophen (NORCO) 5-325 MG tablet Take 1 tablet by mouth every 6 (six) hours as needed for moderate pain. 10 tablet 0  . predniSONE (DELTASONE) 10 MG tablet Take 1 tablet (10 mg total) by mouth daily with breakfast. 30 tablet 1  . predniSONE (DELTASONE) 20 MG tablet Take 3 tablets (60 mg total) by mouth daily. Take on days 2-5 of chemotherapy. (Patient not taking: Reported on 09/24/2015) 12 tablet 6   No current facility-administered medications for this visit.    PHYSICAL EXAMINATION: ECOG PERFORMANCE STATUS: 3 - Symptomatic, >50% confined to bed  Filed Vitals:   10/15/15 1008  BP: 98/47  Pulse: 106  Temp: 97.9 F (36.6 C)  Resp: 18   Filed Weights    GENERAL:alert, no distress and comfortable. She is sitting on the wheelchair, examination is limited because of this SKIN: skin color, texture, turgor are normal, no rashes or significant lesions EYES: normal, Conjunctiva are pink and non-injected, sclera clear HEART: moderate bilateral lower extremity edema ABDOMEN:abdomen soft, non-tender and normal bowel sounds Musculoskeletal:no cyanosis of digits and no clubbing  NEURO: alert & oriented x 3 with fluent speech, with inability to move and upgoing plantar response   LABORATORY DATA:  I have reviewed the data as listed    Component Value Date/Time   NA 137 10/02/2015 0653   NA 137 09/21/2015 0843   NA 142 06/01/2012 1008   K 4.3 10/02/2015 0653   K 3.2* 09/21/2015 0843   K 3.8 06/01/2012 1008   CL 108 10/02/2015 0653   CL 107 04/22/2013 0922   CL 103 06/01/2012 1008   CO2 22 10/02/2015 0653   CO2 29  09/21/2015 0843   CO2 25 06/01/2012 1008   GLUCOSE 86 10/02/2015 0653   GLUCOSE 96 09/21/2015 0843   GLUCOSE 89 04/22/2013 0922   GLUCOSE 108 06/01/2012 1008   BUN 36* 10/02/2015 0653   BUN 4.3* 09/21/2015 0843   BUN 9 06/01/2012 1008   CREATININE 1.96* 10/02/2015 0653   CREATININE 0.6  09/21/2015 0843   CREATININE 0.9 06/01/2012 1008   CALCIUM 8.1* 10/02/2015 0653   CALCIUM 9.2 09/21/2015 0843   CALCIUM 9.8 06/01/2012 1008   PROT 5.7* 09/21/2015 0843   PROT 7.5 06/13/2014 0819   PROT 8.1 08/14/2010 1251   ALBUMIN 2.6* 10/02/2015 0653   ALBUMIN 2.7* 09/21/2015 0843   ALBUMIN 3.4 08/14/2010 1251   AST 20 09/21/2015 0843   AST 23 06/13/2014 0819   AST 19 08/14/2010 1251   ALT 11 09/21/2015 0843   ALT 13 06/13/2014 0819   ALT 9* 08/14/2010 1251   ALKPHOS 87 09/21/2015 0843   ALKPHOS 89 06/13/2014 0819   ALKPHOS 78 08/14/2010 1251   BILITOT 0.42 09/21/2015 0843   BILITOT 0.2* 06/13/2014 0819   BILITOT 0.40 08/14/2010 1251   GFRNONAA 23* 10/02/2015 0653   GFRAA 27* 10/02/2015 0653    No results found for: SPEP, UPEP  Lab Results  Component Value Date   WBC 5.9 10/15/2015   NEUTROABS 5.2 10/15/2015   HGB 7.7* 10/15/2015   HCT 25.0* 10/15/2015   MCV 81.1 10/15/2015   PLT 127* 10/15/2015      Chemistry      Component Value Date/Time   NA 137 10/02/2015 0653   NA 137 09/21/2015 0843   NA 142 06/01/2012 1008   K 4.3 10/02/2015 0653   K 3.2* 09/21/2015 0843   K 3.8 06/01/2012 1008   CL 108 10/02/2015 0653   CL 107 04/22/2013 0922   CL 103 06/01/2012 1008   CO2 22 10/02/2015 0653   CO2 29 09/21/2015 0843   CO2 25 06/01/2012 1008   BUN 36* 10/02/2015 0653   BUN 4.3* 09/21/2015 0843   BUN 9 06/01/2012 1008   CREATININE 1.96* 10/02/2015 0653   CREATININE 0.6 09/21/2015 0843   CREATININE 0.9 06/01/2012 1008      Component Value Date/Time   CALCIUM 8.1* 10/02/2015 0653   CALCIUM 9.2 09/21/2015 0843   CALCIUM 9.8 06/01/2012 1008   ALKPHOS 87 09/21/2015 0843   ALKPHOS 89 06/13/2014 0819   ALKPHOS 78 08/14/2010 1251   AST 20 09/21/2015 0843   AST 23 06/13/2014 0819   AST 19 08/14/2010 1251   ALT 11 09/21/2015 0843   ALT 13 06/13/2014 0819   ALT 9* 08/14/2010 1251   BILITOT 0.42 09/21/2015 0843   BILITOT 0.2* 06/13/2014 0819   BILITOT 0.40  08/14/2010 1251      ASSESSMENT & PLAN:  Paralysis of both lower limbs ([place]) The patient has acute paralysis of both lower limbs. According to the patient, this has happened even before her recent discharge but according to her son, this is acute in nature, possibly over the last few days. I recommend direct referral to emergency room for acute management and further workup for acute paralysis.  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 Per prior discussion, she is not a candidate for further systemic treatment. I recommend consideration for palliative care and hospice in the future The son agree with supportive care for now.   No orders of the defined types were placed in this  encounter.   All questions were answered. The patient knows to call the clinic with any problems, questions or concerns. No barriers to learning was detected. I spent 20 minutes counseling the patient face to face. The total time spent in the appointment was 25 minutes and more than 50% was on counseling and review of test results     [facility], [place], MD 10/15/2015 10:24 AM

## 2015-10-15 NOTE — Assessment & Plan Note (Signed)
She has advanced stage IV CLL and advanced metastatic breast cancer. Per prior discussion, she is not a candidate for further systemic treatment. I recommend consideration for palliative care and hospice in the future The son agree with supportive care for now.

## 2015-10-15 NOTE — ED Notes (Signed)
MRI states they will be at bedside in 30-35 minutes

## 2015-10-15 NOTE — ED Notes (Signed)
Rees at bedside. 

## 2015-10-15 NOTE — ED Provider Notes (Signed)
CSN: [id number]     Arrival date & time 10/15/15  1038 History   First MD Initiated Contact with Patient 10/15/15 1044     Chief Complaint  Patient presents with  . Numbness     The history is provided by the patient and a relative. No language interpreter was used.    Helen [name] is a 80 y.o. female who presents to the Emergency Department complaining of lower extremity numbness and weakness. She presents from the oncology clinic for evaluation of bilateral lower extremity numbness and weakness. History is provided by the patient and her son. She has a history of stage IV breast cancer and CLL. She reports 2 weeks of progressive lower extremity swelling and numbness and weakness. Symptoms were present mildly during hospital admission 2 weeks ago. Symptoms significantly worsened over the last few days and she has complete lack of sensation up to her abdomen. She reports urinary and fecal incontinence. She has back pain with moving her bed changes. Symptoms are severe, constant, worsening.  Past Medical History  Diagnosis Date  . Arthritis   . Blood transfusion 2009  . Hypertension   . Seizures ([place]) 1980's    from medication reaction/Pt.  [name] Hx of radiation therapy 09/11/08 -10/31/08    left breast  . History of radiation therapy eot 07/30/12    lumbar spine L4 /l shoulder  . Cough 10/10/2014  . Breast CA ([place]) 08/2008    (LT) breast ca dx 10/09/Chemo  . Lymphoma ([place]) 09/09/2011    NHL  . Leukemia, acute, in remission ([place]) 2012    Pt. not sure of type  . Metastasis to bone ([place]) 07/03/2012    MRI L spine  . Clicking tinnitus of left ear 01/02/2015  . Diarrhea 02/06/2015  . Pneumonia due to other specified infectious organisms 04/03/2015  . Hypokalemia 04/17/2015  . Right leg DVT ([place]) 05/29/2015  . S/P radiation therapy 06/05/15-06/18/15    right neck 25Gy/36fx   Past Surgical History  Procedure Laterality Date  . Mastectomy    . Thyroidectomy  1965  . Appendectomy    .  Tonsillectomy     Family History  Problem Relation Age of Onset  . Alcohol abuse Other   . Drug abuse Other   . Breast cancer Mother   . Prostate cancer Father   . Cancer Father     colon  . Colon cancer Brother   . Esophageal cancer Neg Hx   . Rectal cancer Neg Hx   . Stomach cancer Neg Hx    Social History  Substance Use Topics  . Smoking status: Never Smoker   . Smokeless tobacco: Never Used  . Alcohol Use: No   OB History    No data available     Review of Systems  All other systems reviewed and are negative.     Allergies  Codeine; Penicillins; Sulfonamide derivatives; Tape; and Tramadol hcl  Home Medications   Prior to Admission medications   Medication Sig Start Date End Date Taking? Authorizing Provider  feeding supplement (BOOST / RESOURCE BREEZE) LIQD Take 1 Container by mouth 3 (three) times daily between meals. 10/02/15   Verlee Monte, MD  HYDROcodone-acetaminophen (NORCO) 5-325 MG tablet Take 1 tablet by mouth every 6 (six) hours as needed for moderate pain. 10/02/15   Verlee Monte, MD  predniSONE (DELTASONE) 10 MG tablet Take 1 tablet (10 mg total) by mouth daily with breakfast. 09/24/15   [name], MD  predniSONE (DELTASONE) 20  MG tablet Take 3 tablets (60 mg total) by mouth daily. Take on days 2-5 of chemotherapy. Patient not taking: Reported on 09/24/2015 08/13/15   Ni Gorsuch, MD   BP 98/52 mmHg  Pulse 109  Temp(Src) 97.4 F (36.3 C) (Oral)  Resp 18  Ht 5\' 2"  (1.575 m)  Wt 114 lb (51.71 kg)  BMI 20.85 kg/m2  SpO2 100% Physical Exam  Constitutional: She is oriented to person, place, and time. She appears well-developed.  Chronically ill appearing, frail.   HENT:  Head: Normocephalic and atraumatic.  Swelling over the left mandibular region without erythema  Cardiovascular: Normal rate and regular rhythm.   No murmur heard. Pulmonary/Chest: Effort normal. No respiratory distress.  Decreased air movement in bilateral bases.  Port a cath  in anterior chest wall.  Right sided chest wall is tender to palpation.   Abdominal: Soft. There is no tenderness. There is no rebound and no guarding.  Musculoskeletal: She exhibits no tenderness.  3+ pitting edema in BLE  Neurological: She is alert and oriented to person, place, and time.  0/5 strength in BLE.  4/5 strength in BUE.  Absent patellar reflexes bilaterally.  Absent sensation to light touch to the level of the umbilicus.    Skin: Skin is warm and dry.  Psychiatric: She has a normal mood and affect. Her behavior is normal.  Nursing note and vitals reviewed.   ED Course  Procedures (including critical care time) Labs Review Labs Reviewed  COMPREHENSIVE METABOLIC PANEL - Abnormal; Notable for the following:    CO2 20 (*)    Glucose, Bld 151 (*)    BUN 36 (*)    Total Protein 4.9 (*)    Albumin 2.1 (*)    AST 61 (*)    ALT 71 (*)    Alkaline Phosphatase 168 (*)    All other components within normal limits  CBC WITH DIFFERENTIAL/PLATELET - Abnormal; Notable for the following:    RBC 2.70 (*)    Hemoglobin 7.0 (*)    HCT 22.3 (*)    MCH 25.9 (*)    RDW 18.3 (*)    Platelets 118 (*)    Lymphs Abs 0.4 (*)    All other components within normal limits    Imaging Review Mr Thoracic Spine W Wo Contrast  10/15/2015  CLINICAL DATA:  Metastatic breast cancer. Numbness and paralysis from the day waist down beginning 3 or 4 days ago. EXAM: MRI THORACIC AND LUMBAR SPINE WITHOUT AND WITH CONTRAST TECHNIQUE: Multiplanar and multiecho pulse sequences of the thoracic and lumbar spine were obtained without and with intravenous contrast. CONTRAST:  69mL MULTIHANCE GADOBENATE DIMEGLUMINE 529 MG/ML IV SOLN COMPARISON:  Total spine MRI 08/11/2014. CT chest, abdomen, and pelvis 09/21/2015. FINDINGS: MR THORACIC SPINE FINDINGS There are multiple new osseous metastases involving vertebral bodies and posterior elements throughout the thoracic spine compared to the prior MRI. The T5 vertebral  body is completely involved by tumor with extension to the posterior elements. There is prominent paravertebral soft tissue tumor bilaterally extending from T4-T6. There is extensive epidural tumor extending from T3 to T6-7, bulkiest from T4-T6 where there is circumferential tumor resulting in severe spinal stenosis. The spinal cord appears markedly edematous through this region, with edema extending cephalad to T1-2 and caudally to T8. The T10 vertebral body is now diffusely involved by tumor. There is minimal ventral epidural tumor at this level without associated spinal stenosis. There is a small right pleural effusion. Subcarinal nodal mass and  patchy areas of abnormal signal throughout both lungs are better evaluated on last month's chest CT. MR LUMBAR SPINE FINDINGS Transitional lumbosacral anatomy with numbering preserved from the prior study. L3 vertebral body lesion has mildly increased in size. There is a new lesion involving much of the left half of the L4 vertebral body. There is a new approximately 2 cm lesion in the left superior aspect of L5. There are new sacral lesions with multiple iliac bone lesions partially visualized. Trace ventral epidural tumor at L3 may have minimally increased from the prior MRI. No definite epidural tumor is identified elsewhere in the lumbar spine. Chronic anterolisthesis is again seen of L4 on L5 measuring 12 mm, not significantly changed. There is mild-to-moderate bilateral neural foraminal stenosis at L4-5. Slight anterolisthesis is again seen of L3 on L4 with disc uncovering and posterior element hypertrophy and fusion resulting in moderate to severe spinal stenosis, similar to prior. There is marked bladder distention, incompletely visualized with moderate right greater than left hydronephrosis. Bilateral renal cysts are again noted. A 2.1 cm lesion in the spleen has increased in size from the prior CT (previously 1.5 cm). Liver metastases are again partially  visualized. Gallstones are partially visualized. IMPRESSION: 1. Interval progression of osseous spinal metastases, greatest in the thoracic spine. 2. Completely replaced T5 vertebral body with extensive epidural tumor resulting in severe spinal stenosis and extensive cord edema. Cord edema is felt to be due to epidural tumor and compression without definite intramedullary tumor identified though difficult to completely exclude. 3. Minimal epidural tumor at T10 without stenosis. 4. Minimal epidural tumor at L3, at most minimally increased from the prior MRI. No stenosis. 5. Marked bladder distention with moderate bilateral hydronephrosis. 6. Increased size of splenic metastasis. Critical Value/emergent results were called by telephone at the time of interpretation on 10/15/2015 at 4:29 pm to Dr. Quintella Reichert , who verbally acknowledged these results. Electronically Signed   By: Logan Bores M.D.   On: 10/15/2015 16:32   Mr Lumbar Spine W Wo Contrast  10/15/2015  CLINICAL DATA:  Metastatic breast cancer. Numbness and paralysis from the day waist down beginning 3 or 4 days ago. EXAM: MRI THORACIC AND LUMBAR SPINE WITHOUT AND WITH CONTRAST TECHNIQUE: Multiplanar and multiecho pulse sequences of the thoracic and lumbar spine were obtained without and with intravenous contrast. CONTRAST:  54mL MULTIHANCE GADOBENATE DIMEGLUMINE 529 MG/ML IV SOLN COMPARISON:  Total spine MRI 08/11/2014. CT chest, abdomen, and pelvis 09/21/2015. FINDINGS: MR THORACIC SPINE FINDINGS There are multiple new osseous metastases involving vertebral bodies and posterior elements throughout the thoracic spine compared to the prior MRI. The T5 vertebral body is completely involved by tumor with extension to the posterior elements. There is prominent paravertebral soft tissue tumor bilaterally extending from T4-T6. There is extensive epidural tumor extending from T3 to T6-7, bulkiest from T4-T6 where there is circumferential tumor resulting in  severe spinal stenosis. The spinal cord appears markedly edematous through this region, with edema extending cephalad to T1-2 and caudally to T8. The T10 vertebral body is now diffusely involved by tumor. There is minimal ventral epidural tumor at this level without associated spinal stenosis. There is a small right pleural effusion. Subcarinal nodal mass and patchy areas of abnormal signal throughout both lungs are better evaluated on last month's chest CT. MR LUMBAR SPINE FINDINGS Transitional lumbosacral anatomy with numbering preserved from the prior study. L3 vertebral body lesion has mildly increased in size. There is a new lesion involving much of the  left half of the L4 vertebral body. There is a new approximately 2 cm lesion in the left superior aspect of L5. There are new sacral lesions with multiple iliac bone lesions partially visualized. Trace ventral epidural tumor at L3 may have minimally increased from the prior MRI. No definite epidural tumor is identified elsewhere in the lumbar spine. Chronic anterolisthesis is again seen of L4 on L5 measuring 12 mm, not significantly changed. There is mild-to-moderate bilateral neural foraminal stenosis at L4-5. Slight anterolisthesis is again seen of L3 on L4 with disc uncovering and posterior element hypertrophy and fusion resulting in moderate to severe spinal stenosis, similar to prior. There is marked bladder distention, incompletely visualized with moderate right greater than left hydronephrosis. Bilateral renal cysts are again noted. A 2.1 cm lesion in the spleen has increased in size from the prior CT (previously 1.5 cm). Liver metastases are again partially visualized. Gallstones are partially visualized. IMPRESSION: 1. Interval progression of osseous spinal metastases, greatest in the thoracic spine. 2. Completely replaced T5 vertebral body with extensive epidural tumor resulting in severe spinal stenosis and extensive cord edema. Cord edema is felt to  be due to epidural tumor and compression without definite intramedullary tumor identified though difficult to completely exclude. 3. Minimal epidural tumor at T10 without stenosis. 4. Minimal epidural tumor at L3, at most minimally increased from the prior MRI. No stenosis. 5. Marked bladder distention with moderate bilateral hydronephrosis. 6. Increased size of splenic metastasis. Critical Value/emergent results were called by telephone at the time of interpretation on 10/15/2015 at 4:29 pm to Dr. Quintella Reichert , who verbally acknowledged these results. Electronically Signed   By: Logan Bores M.D.   On: 10/15/2015 16:32   I have personally reviewed and evaluated these images and lab results as part of my medical decision-making.   EKG Interpretation None      MDM   Final diagnoses:  Weakness of lower extremity  Spinal cord compression [facility])    Patient with history of metastatic cancer here for progressive lower extremity weakness, currently paralyzed on examination. MRI demonstrates progression of her tumors. Plan to consult radiation oncology to see if there are any possible interventions. Discussed with patient and family grim prognosis. Patient care transferred to Dr. [name] pending radiation oncology conversation.    Quintella Reichert, MD 10/15/15 1659

## 2015-10-15 NOTE — Assessment & Plan Note (Signed)
The patient has acute paralysis of both lower limbs. According to the patient, this has happened even before her recent discharge but according to her son, this is acute in nature, possibly over the last few days. I recommend direct referral to emergency room for acute management and further workup for acute paralysis.

## 2015-10-15 NOTE — Telephone Encounter (Signed)
Patient coming from [place] today to see Dr. Alvy Bimler.  Son Karleena Casey calling asking to talk to Dr. Alvy Bimler before she see's patient in exam room.  Will notify provider of this request.  Per Salome Spotted should have contacted Dr. Alvy Bimler about Mom having swelling in her legs and not able to walk or stand the past week.  She will require a lot of assistance today."

## 2015-10-15 NOTE — ED Notes (Signed)
This nurse called [facility] for report; was told they would call back

## 2015-10-15 NOTE — ED Notes (Signed)
Pt transferred to MRI

## 2015-10-15 NOTE — ED Notes (Signed)
Pt returned from MRI °

## 2015-10-15 NOTE — ED Notes (Addendum)
Pt transferred from [facility] with complaint of numbness/paraylsis waist down onset 3 or 4 days ago and nonweightbearing for weeks. Pt bilateral pedal pulses heard via doppler on arrival. Hx of breast cancer left. Denies pain.  Pt lives @ [facility].

## 2015-10-16 ENCOUNTER — Ambulatory Visit
Admission: RE | Admit: 2015-10-16 | Discharge: 2015-10-16 | Disposition: A | Discharge status: Home / Self Care | Payer: Medicare Other | Source: Ambulatory Visit | Attending: Radiation Oncology | Admitting: Radiation Oncology

## 2015-10-16 ENCOUNTER — Ambulatory Visit: Payer: Medicare Other | Attending: Radiation Oncology | Admitting: Radiation Oncology

## 2015-10-16 DIAGNOSIS — L8990 Pressure ulcer of unspecified site, unspecified stage: Secondary | ICD-10-CM | POA: Insufficient documentation

## 2015-10-16 LAB — COMPREHENSIVE METABOLIC PANEL
ALBUMIN: 2.2 g/dL — AB (ref 3.5–5.0)
ALK PHOS: 162 U/L — AB (ref 38–126)
ALT: 80 U/L — AB (ref 14–54)
AST: 71 U/L — AB (ref 15–41)
Anion gap: 9 (ref 5–15)
BUN: 26 mg/dL — ABNORMAL HIGH (ref 6–20)
CHLORIDE: 108 mmol/L (ref 101–111)
CO2: 21 mmol/L — AB (ref 22–32)
Calcium: 9.2 mg/dL (ref 8.9–10.3)
Creatinine, Ser: 0.66 mg/dL (ref 0.44–1.00)
GFR calc non Af Amer: >60 mL/min (ref >60)
GLUCOSE: 133 mg/dL — AB (ref 65–99)
Potassium: 4.2 mmol/L (ref 3.5–5.1)
SODIUM: 138 mmol/L (ref 135–145)
TOTAL PROTEIN: 5.3 g/dL — AB (ref 6.5–8.1)
Total Bilirubin: 0.7 mg/dL (ref 0.3–1.2)

## 2015-10-16 LAB — TYPE AND SCREEN
ABO/RH(D): O POS
ANTIBODY SCREEN: NEGATIVE
Unit division: 0

## 2015-10-16 LAB — CBC WITH DIFFERENTIAL/PLATELET
BASOS ABS: 0 10*3/uL (ref 0.0–0.1)
Basophils Relative: 0 %
EOS ABS: 0 10*3/uL (ref 0.0–0.7)
Eosinophils Relative: 0 %
HCT: 28 % — ABNORMAL LOW (ref 36.0–46.0)
HEMOGLOBIN: 9.1 g/dL — AB (ref 12.0–15.0)
LYMPHS PCT: 4 %
Lymphs Abs: 0.3 10*3/uL — ABNORMAL LOW (ref 0.7–4.0)
MCH: 26.6 pg (ref 26.0–34.0)
MCHC: 32.5 g/dL (ref 30.0–36.0)
MCV: 81.9 fL (ref 78.0–100.0)
MONO ABS: 0.3 10*3/uL (ref 0.1–1.0)
Monocytes Relative: 5 %
NEUTROS PCT: 91 %
Neutro Abs: 6.3 10*3/uL (ref 1.7–7.7)
PLATELETS: 122 10*3/uL — AB (ref 150–400)
RBC: 3.42 MIL/uL — AB (ref 3.87–5.11)
RDW: 16.6 % — ABNORMAL HIGH (ref 11.5–15.5)
WBC: 6.9 10*3/uL (ref 4.0–10.5)

## 2015-10-16 LAB — TSH: TSH: 1.654 u[IU]/mL (ref 0.350–4.500)

## 2015-10-16 LAB — MAGNESIUM: Magnesium: 1.8 mg/dL (ref 1.7–2.4)

## 2015-10-16 LAB — PHOSPHORUS: PHOSPHORUS: 3.2 mg/dL (ref 2.5–4.6)

## 2015-10-16 MED ORDER — CETYLPYRIDINIUM CHLORIDE 0.05 % MT LIQD
7.0000 mL | Freq: Two times a day (BID) | OROMUCOSAL | Status: DC
  Administered 2015-10-16 – 2015-10-17 (×4): 7 mL via OROMUCOSAL

## 2015-10-16 NOTE — Progress Notes (Addendum)
Department of Radiation Oncology  Phone:  [phone] Fax:        [phone]   Name: Helen [name] MRN: [id number]  DOB: Sep 09, 1935  Date: 10/15/2015  Follow Up Visit Note (inpatient)  Diagnosis: Cancer of left breast [facility])   Staging form: Breast, AJCC 6th Edition     Clinical: Stage IV (T4d, NX, M1) - Signed by [name], MD on 02/23/2014 Lymphoma, small lymphocytic ([place])   Staging form: Lymphoid Neoplasms, AJCC 6th Edition     Clinical: Stage IV - Signed by [name], MD on 02/23/2014  Summary and Interval since last radiation: 25 Gy in 10 fractions completed 06/18/15  Interval History: Helen [name] was admitted after Thanksgiving after falling several times. She reports her legs felt weak and swollen. Work up revealed progressive disease. She was discharged for rehabilitation and seen by Dr. Alvy Bimler who did not feel further systemic therapy was warranted. When she saw Dr. Alvy Bimler, she complained that she could not longer move her legs and that she had lost sensation from her waist down. She also was found to have hydro nephrosis.  She states she has not been able to stand for 3 weeks. She had seen palliative care at her last admission. In the ER, an MRI was performed which showed cord compression at T5 with epidural tumor extending from T3-T7.  In addition the T10 vertebral body was also involved with some posterior spread of epidural tumor.  Lesions were seen in multiple lumbar vetebral bodies and the sacrum as well with iliac lesions seen too.  I was asked to consult as the on-call radiation oncologist for radiation treatment. She is not a surgical candidate due to progressive Stage IV breast cancer and CLL as well as her age.  She is unaccompanied in her hospital room today. She is feeding herself. She complains of pain in her right shoulder and left shoulder. She also complains of pain in her low back but not in between her shoulder blades.   Physical Exam:  Filed Vitals:   10/15/15  2330 10/16/15 0000 10/16/15 0129 10/16/15 0554  BP: 104/47 110/42 120/56 116/55  Pulse: 100 97 86 72  Temp: 99.9 F (37.7 C) 99.1 F (37.3 C) 99.1 F (37.3 C) 97.7 F (36.5 C)  TempSrc: Oral Oral Oral Oral  Resp: 24 24 20 22   Height:      Weight:      SpO2: 97% 98% 100% 100%   Alert and oriented. No sensation to light touch over her entire lower extremities. 0/5 strength in dorsi/plantar flexion.   IMPRESSION: Helen [name] is a 80 y.o. female with new cord compression likely from metastatic breast cancer versus lymphoma.   PLAN:  Her ultimate goal is to walk again she says. Helen [name] has been non ambulatory for at least 3 weeks. This limits the recovery of function in the cord compression setting with radiation alone (<20%).  Her only real chance of long term success is with surgery and radiation but she is not a surgical candidate.  She has signed a DNR order and would like to pursue a palliative care approach at home/SNF which I think is reasonable.   She will unfortunately eventually lose sensation in the lower lumbar spine and as such I do not feel the risks of daily transportation for treatment of the lumbosacral region is probably worthwhile in this setting. In possible diarrhea and nausea that would accompany a field this big would likely be problematic as well.   We discussed  the process of radiation and 5-10 treatments. We discussed tattoos and simulation which she is familiar with. We discuss possible side effects of treatment including dysphagia and odynophagia.   She is going to discuss with her family and let me know if she would like to proceed with treatment.   I would continue decadron and aggressive pain management.   Helen Silversmith, MD

## 2015-10-16 NOTE — Progress Notes (Signed)
Helen [name] [id number] DOB: [birth year] DOA: 10/15/2015 PCP: [name], MD  Brief narrative: 80 y/o ? stg IV Small cell lymphocytic lymphoma Advanced Met Br Ca [not candidate for syst Chemo] R-IJ veins thrmobiosis off of AC 2/2 to gum bleeds Adult FTT 2/2 to cancer Recent admission 11/24-11/29/16 Le swelling  Represented from Oncology office with acute LE paralysis and concern for t5 mets and epidural tumour L3 Place don steorids Rad-ONc and palliative consulted    Past medical history-As per Problem list Chart reviewed as below-   Consultants:  Pallaitive  Rad Onc  Procedures:    Antibiotics:     Subjective  Alert but not fully oriented-seems a little bit confused as sequence of events Tells me that she cannot really move her legs and has difficulty doing this "for a while" No nausea vomiting No fever No chills No shortness breath No chest pain  Objective    Interim History:   Telemetry:    Objective: Filed Vitals:   10/15/15 2330 10/16/15 0000 10/16/15 0129 10/16/15 0554  BP: 104/47 110/42 120/56 116/55  Pulse: 100 97 86 72  Temp: 99.9 F (37.7 C) 99.1 F (37.3 C) 99.1 F (37.3 C) 97.7 F (36.5 C)  TempSrc: Oral Oral Oral Oral  Resp: 24 24 20 22   Height:      Weight:      SpO2: 97% 98% 100% 100%    Intake/Output Summary (Last 24 hours) at 10/16/15 0845 Last data filed at 10/16/15 0600  Gross per 24 hour  Intake 1544.42 ml  Output   2400 ml  Net -855.58 ml    Exam:  General: EOMI NCAT  Cardiovascular: S1-S2 no murmur rub or gallop  Respiratory: Clinically clear no added sound  Abdomen: Soft nontender nondistended no rebound  Skinno lower extremity edema-noted to have decubiti which were not examined this morning  Neurocannot move lower extremities Plantar dorsiflex the ankle cannot bend at the hip   Data Reviewed: Basic Metabolic Panel:  Recent Labs Lab 10/15/15 1222 10/16/15 0420  NA 137 138  K 4.1 4.2  CL 108  108  CO2 20* 21*  GLUCOSE 151* 133*  BUN 36* 26*  CREATININE 0.83 0.66  CALCIUM 9.0 9.2  MG  --  1.8  PHOS  --  3.2   Liver Function Tests:  Recent Labs Lab 10/15/15 1222 10/16/15 0420  AST 61* 71*  ALT 71* 80*  ALKPHOS 168* 162*  BILITOT 0.4 0.7  PROT 4.9* 5.3*  ALBUMIN 2.1* 2.2*   No results for input(s): LIPASE, AMYLASE in the last 168 hours. No results for input(s): AMMONIA in the last 168 hours. CBC:  Recent Labs Lab 10/15/15 0954 10/15/15 1222 10/16/15 0420  WBC 5.9 6.2 6.9  NEUTROABS 5.2 5.4 6.3  HGB 7.7* 7.0* 9.1*  HCT 25.0* 22.3* 28.0*  MCV 81.1 82.6 81.9  PLT 127* 118* 122*   Cardiac Enzymes: No results for input(s): CKTOTAL, CKMB, CKMBINDEX, TROPONINI in the last 168 hours. BNP: Invalid input(s): POCBNP CBG: No results for input(s): GLUCAP in the last 168 hours.  Recent Results (from the past 240 hour(s))  TECHNOLOGIST REVIEW     Status: None   Collection Time: 10/15/15  9:54 AM  Result Value Ref Range Status   Technologist Review mod. ovalos present  Final     Studies:              All Imaging reviewed and is as per above notation   Scheduled Meds: . antiseptic  oral rinse  7 mL Mouth Rinse BID  . dexamethasone  6 mg Intravenous 4 times per day  . feeding supplement  1 Container Oral TID BM  . feeding supplement (PRO-STAT SUGAR FREE 64)  30 mL Oral TID WC  . nystatin  5 mL Mouth/Throat QID   Continuous Infusions:    Assessment/Plan:   Central cord impingement-await radiology and palliative impression. Continue for now Decadron 6 mg IV 4 times a day May benefit from directed radiotherapy to the area. 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  Suspect will be d/c back to [facility] with palliative following soon   Verneita Griffes, MD  [facility] Pager [phone] 10/16/2015, 8:45 AM    LOS: 1 day

## 2015-10-16 NOTE — Progress Notes (Signed)
PT Cancellation Note  Patient Details Name: Helen [name] MRN: [id number] DOB: Mar 05, 1935   Cancelled Treatment:    Reason Eval/Treat Not Completed: Other (comment) (chart reviewed. Note that Palliative care has been consulted and noted WOC recommended surgical consult. will follow up 12/14 for GOC/plan and initiate HYDRO therapy if indicated.   Helen [name] 10/16/2015, 2:22 PM Tresa Endo PT [phone]

## 2015-10-16 NOTE — Consult Note (Addendum)
WOC wound consult note Reason for Consult: Pressure injuries at sacrum and left ischial tuberosity. Patient presented with dual incontinence yesterday, IUC inserted for UI.  Paitent is incontinence of soft semiformed stool at the time of my assessment. Bilateral heels are intact and floated, but I will provide pressure redistribution heel boots today. Wound type: Pressure Pressure Ulcer POA: Yes Measurement:Sacral (Unstageable): 8cm x 8cm with depth obscured by the presence of soft eschar. Left IT with Stage 3 pressure injury, 80% pink, moist, 20% yellow slough. Wound bed:As described  Drainage (amount, consistency, odor) None from Periwound: Intact, dry, warm, not indurated or fluctuant. Dressing procedure/placement/frequency: Due to the anticipated severity of this pressure injury, I suggest consultation with CCS for consideration of surgical debridement (even at bedside) or hydrotherapy if conservative enzymatic debridement (Santyl/collagenase) is the option of choice.  Appreciate competing priorities and if POC becomes Palliative, agree with conservative measures. I will provide a pressure redistribution mattress replacement today and provided Nursing with guidance for turning, repositioning and avoidance of the supine position except for meals. [place] nursing team will not follow, but will remain available to this patient, the nursing and medical teams.  Please re-consult if needed. Thanks, Maudie Flakes, MSN, RN, [place], Arther Abbott  Pager# [phone]

## 2015-10-16 NOTE — Plan of Care (Signed)
Problem: Activity: Goal: Risk for activity intolerance will decrease Outcome: Not Applicable Date Met:  33/74/45 BLE paralysis/ End of life care

## 2015-10-17 DIAGNOSIS — G893 Neoplasm related pain (acute) (chronic): Secondary | ICD-10-CM

## 2015-10-17 DIAGNOSIS — C7800 Secondary malignant neoplasm of unspecified lung: Secondary | ICD-10-CM

## 2015-10-17 DIAGNOSIS — L8990 Pressure ulcer of unspecified site, unspecified stage: Secondary | ICD-10-CM

## 2015-10-17 DIAGNOSIS — C8300 Small cell B-cell lymphoma, unspecified site: Secondary | ICD-10-CM

## 2015-10-17 DIAGNOSIS — G9520 Unspecified cord compression: Secondary | ICD-10-CM

## 2015-10-17 DIAGNOSIS — R627 Adult failure to thrive: Secondary | ICD-10-CM | POA: Insufficient documentation

## 2015-10-17 DIAGNOSIS — Z515 Encounter for palliative care: Secondary | ICD-10-CM

## 2015-10-17 MED ORDER — HYDROCODONE-ACETAMINOPHEN 5-325 MG PO TABS
1.0000 | ORAL_TABLET | ORAL | Status: DC | PRN
  Administered 2015-10-17 – 2015-10-18 (×2): 1 via ORAL
  Filled 2015-10-17 (×3): qty 1

## 2015-10-17 MED ORDER — MORPHINE SULFATE (CONCENTRATE) 10 MG/0.5ML PO SOLN: 5.0000 mg | ORAL | Status: DC | PRN

## 2015-10-17 MED ORDER — LORAZEPAM 1 MG PO TABS: 1.0000 mg | ORAL_TABLET | ORAL | Status: DC | PRN

## 2015-10-17 NOTE — Progress Notes (Signed)
Physical Therapy Discharge Patient Details Name: Helen [name] MRN: [id number] DOB: Aug 25, 1935 Today's Date: 10/17/2015 Time:  -     Patient discharged from PT services secondary to ;Palliative Care meeting today. Plan is for residential Hospice. No acute PT needs at this time.    GP     Marcelino Freestone PT [phone]  10/17/2015, 3:02 PM

## 2015-10-17 NOTE — Progress Notes (Signed)
Nutrition Brief Note  Pt identified as at nutrition risk on the Malnutrition Screen Tool  Chart reviewed. Pt now transitioning to comfort care.  No further nutrition interventions warranted at this time.  Please consult as needed.   Gasper Hopes, MS, RD, LDN Pager: [phone] After Hours Pager: [phone]  

## 2015-10-17 NOTE — Progress Notes (Signed)
Helen [name] [id number] DOB: 1935/07/12 DOA: 10/15/2015 PCP: [name], MD  Brief narrative: 80 y/o ? stg IV Small cell lymphocytic lymphoma Advanced Met Br Ca [not candidate for syst Chemo] R-IJ veins thrmobiosis off of AC 2/2 to gum bleeds Adult FTT 2/2 to cancer Recent admission 11/24-11/29/16 Le swelling  Represented from Oncology office with acute LE paralysis and concern for t5 mets and epidural tumour L3 Place don steorids Rad-ONc and palliative consulted    Past medical history-As per Problem list Chart reviewed as below-   Consultants:  Pallaitive  Rad Onc  Procedures:  none  Antibiotics:  none   Subjective  Alert , report feeling better, look comfortable, no sensation, no movement  bilateral lower extremity, son in room   Objective    Interim History:   Telemetry:    Objective: Filed Vitals:   10/16/15 0554 10/16/15 1504 10/16/15 2104 10/17/15 0644  BP: 116/55 131/60 147/65 121/59  Pulse: 72 84 107 74  Temp: 97.7 F (36.5 C) 97.7 F (36.5 C) 98.6 F (37 C) 98.1 F (36.7 C)  TempSrc: Oral Oral Oral Oral  Resp: 22 21 20 20   Height:      Weight:      SpO2: 100% 98% 100% 100%    Intake/Output Summary (Last 24 hours) at 10/17/15 1419 Last data filed at 10/17/15 1141  Gross per 24 hour  Intake    360 ml  Output   2900 ml  Net  -2540 ml    Exam:  General: EOMI NCAT , tender to palpation to chest wall Cardiovascular: S1-S2 no murmur rub or gallop  Respiratory: Clinically clear no added sound  Abdomen: Soft nontender nondistended no rebound  Skinno lower extremity edema-noted to have decubiti which were not examined this morning  Neurocannot move lower extremities, no sensation bilateral lower extremity  Data Reviewed: Basic Metabolic Panel:  Recent Labs Lab 10/15/15 1222 10/16/15 0420  NA 137 138  K 4.1 4.2  CL 108 108  CO2 20* 21*  GLUCOSE 151* 133*  BUN 36* 26*  CREATININE 0.83 0.66  CALCIUM 9.0 9.2  MG  --   1.8  PHOS  --  3.2   Liver Function Tests:  Recent Labs Lab 10/15/15 1222 10/16/15 0420  AST 61* 71*  ALT 71* 80*  ALKPHOS 168* 162*  BILITOT 0.4 0.7  PROT 4.9* 5.3*  ALBUMIN 2.1* 2.2*   No results for input(s): LIPASE, AMYLASE in the last 168 hours. No results for input(s): AMMONIA in the last 168 hours. CBC:  Recent Labs Lab 10/15/15 0954 10/15/15 1222 10/16/15 0420  WBC 5.9 6.2 6.9  NEUTROABS 5.2 5.4 6.3  HGB 7.7* 7.0* 9.1*  HCT 25.0* 22.3* 28.0*  MCV 81.1 82.6 81.9  PLT 127* 118* 122*   Cardiac Enzymes: No results for input(s): CKTOTAL, CKMB, CKMBINDEX, TROPONINI in the last 168 hours. BNP: Invalid input(s): POCBNP CBG: No results for input(s): GLUCAP in the last 168 hours.  Recent Results (from the past 240 hour(s))  TECHNOLOGIST REVIEW     Status: None   Collection Time: 10/15/15  9:54 AM  Result Value Ref Range Status   Technologist Review mod. ovalos present  Final     Studies:              All Imaging reviewed and is as per above notation   Scheduled Meds: . antiseptic oral rinse  7 mL Mouth Rinse BID  . dexamethasone  6 mg Intravenous 4 times per day  .  feeding supplement  1 Container Oral TID BM  . feeding supplement (PRO-STAT SUGAR FREE 64)  30 mL Oral TID WC  . nystatin  5 mL Mouth/Throat QID   Continuous Infusions:    Assessment/Plan:   Central cord impingement-await radiology and palliative impression. 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

## 2015-10-17 NOTE — Progress Notes (Signed)
PHYSICAL THERAPY NOTE; WILL PLAN TO PERFORM HYDROTHERAPY TODAY AT 1:30.Tresa Endo PT [phone]

## 2015-10-17 NOTE — Progress Notes (Signed)
Physical therapy note- Palliative meeting scheduled at 2:00. Will await GOC,  Per MSW, possible plan for  Residential Hospice. Tresa Endo PT [phone]

## 2015-10-17 NOTE — NC FL2 (Signed)
[place] LEVEL OF CARE SCREENING TOOL     IDENTIFICATION  Patient Name: Helen [name] Birthdate: April 17, 1935 Sex: female Admission Date (Current Location): 10/15/2015  [facility] and [place] Number:  ([place])   Facility and Address:         Provider Number: [id number]  Attending Physician Name and Address:  Florencia Reasons, MD  Relative Name and Phone Number:       Current Level of Care: Hospital Recommended Level of Care: [place] Prior Approval Number: [id number]  Date Approved/Denied:   PASRR Number:    Discharge Plan: SNF    Current Diagnoses: Patient Active Problem List   Diagnosis Date Noted  . Pressure ulcer 10/16/2015  . Paralysis of both lower limbs ([place]) 10/15/2015  . Cord compression ([place]) 10/15/2015  . Anemia 10/15/2015  . Cord compression syndrome ([place]) 10/15/2015  . Acute urinary retention 10/15/2015  . Metastatic breast cancer ([place])   . Severe malnutrition ([place]) 10/02/2015  . Acute kidney injury ([place]) 09/29/2015  . Failure to thrive (0-17) 09/28/2015  . DNR (do not resuscitate) discussion 09/28/2015  . Leg swelling   . Metastatic cancer ([place])   . Skin lesions 09/24/2015  . Palliative care encounter 09/24/2015  . Protein calorie malnutrition ([place]) 08/13/2015  . Leukocytosis 08/06/2015  . Mucositis due to chemotherapy 07/30/2015  . Bilateral leg edema 06/18/2015  . Acute thrombosis of right internal jugular vein ([place]) 05/29/2015  . Metastasis to supraclavicular lymph node ([place]) 05/09/2015  . Hypokalemia 04/17/2015  . Pneumonia due to other specified infectious organisms 04/03/2015  . Diarrhea due to drug 02/06/2015  . Thrombocytopenia due to drugs 02/06/2015  . Clicking tinnitus of left ear 01/02/2015  . Malignant neoplasm metastatic to both lungs ([place]) 01/02/2015  . Cough 10/10/2014  . Skin rash 10/10/2014  . Poor dentition 08/08/2014  . Pulmonary metastases ([place]) 08/08/2014  . Liver metastasis ([place]) 08/08/2014   . Pancytopenia due to antineoplastic chemotherapy ([place]) 05/09/2014  . Chest wall tenderness 05/09/2014  . Leukopenia due to antineoplastic chemotherapy 05/09/2014  . History of ongoing treatment with hormonal therapy 01/03/2014  . Hx of radiation therapy   . Metastasis to bone ([place]) 07/08/2012  . Other abnormal glucose 02/10/2012  . Osteopenia 02/10/2012  . Pure hypercholesterolemia 02/10/2012  . Routine general medical examination at a health care facility 02/10/2012  . Lymphoma, small lymphocytic ([place]) 09/09/2011  . Postsurgical hypothyroidism 11/18/2010  . ALLERGIC RHINITIS 09/16/2010  . DEGENERATIVE JOINT DISEASE, BOTH KNEES, SEVERE 05/24/2009  . ASTEATOTIC ECZEMA 04/09/2009  . Cancer of left breast ([place]) 08/03/2008    Orientation RESPIRATION BLADDER Height & Weight       Normal Continent      BEHAVIORAL SYMPTOMS/MOOD NEUROLOGICAL BOWEL NUTRITION STATUS      Continent Diet (heart healthy fluid consistecny thin)  AMBULATORY STATUS COMMUNICATION OF NEEDS Skin     Verbally PU Stage and Appropriate Care, Other (Comment) (buttocks stage 2 unstagable sacrum)   PU Stage 2 Dressing: BID                   Personal Care Assistance Level of Assistance              Functional Limitations Info    Sight Info: Adequate Hearing Info: Adequate      SPECIAL CARE FACTORS FREQUENCY        PT Frequency: 5 x week OT Frequency: 5 time week            Contractures  Additional Factors Info  Code Status, Allergies Code Status Info: DNR Allergies Info: Codeine, Penicillins, Sulfonamide Derivatives, Tape, Tramadol Hcl           Current Medications (10/17/2015):  This is the current hospital active medication list Current Facility-Administered Medications  Medication Dose Route Frequency Provider Last Rate Last Dose  . acetaminophen (TYLENOL) tablet 650 mg  650 mg Oral Q6H PRN Toy Baker, MD   650 mg at 10/16/15 2334   Or  . acetaminophen (TYLENOL)  suppository 650 mg  650 mg Rectal Q6H PRN Toy Baker, MD      . antiseptic oral rinse (CPC / CETYLPYRIDINIUM CHLORIDE 0.05%) solution 7 mL  7 mL Mouth Rinse BID Toy Baker, MD   7 mL at 10/16/15 2145  . dexamethasone (DECADRON) injection 6 mg  6 mg Intravenous 4 times per day Toy Baker, MD   6 mg at 10/17/15 0528  . feeding supplement (BOOST / RESOURCE BREEZE) liquid 1 Container  1 Container Oral TID BM Toy Baker, MD   1 Container at 10/16/15 2145  . feeding supplement (PRO-STAT SUGAR FREE 64) liquid 30 mL  30 mL Oral TID WC Anastassia Doutova, MD   30 mL at 10/17/15 0800  . HYDROcodone-acetaminophen (NORCO/VICODIN) 5-325 MG per tablet 1 tablet  1 tablet Oral Q6H PRN Toy Baker, MD   1 tablet at 10/17/15 0528  . nystatin (MYCOSTATIN) 100000 UNIT/ML suspension 500,000 Units  5 mL Mouth/Throat QID Toy Baker, MD   500,000 Units at 10/16/15 2143  . ondansetron (ZOFRAN) tablet 4 mg  4 mg Oral Q6H PRN Toy Baker, MD       Or  . ondansetron (ZOFRAN) injection 4 mg  4 mg Intravenous Q6H PRN Toy Baker, MD         Discharge Medications: Please see discharge summary for a list of discharge medications.  Relevant Imaging Results:  Relevant Lab Results:   Additional Information [id number]  Kujawa,Regina G, LCSW

## 2015-10-17 NOTE — Consult Note (Signed)
Consultation Note Date: 10/17/2015   Patient Name: Helen [name]  DOB: 1934/12/22  MRN: [id number]  Age / Sex: 80 y.o., female  PCP: [name], MD Referring Physician: Florencia Reasons, MD  Reason for Consultation: Establishing goals of care, Hospice Evaluation, Non pain symptom management, Pain control and Psychosocial/spiritual support    Clinical Assessment/Narrative:  Helen [name] is a 80 y.o. female has a past medical history of Arthritis; Blood transfusion (2009); Hypertension; Seizures ([place]) (1980's); radiation therapy (09/11/08 -10/31/08); History of radiation therapy (eot 07/30/12); Cough (10/10/2014); Breast CA ([place]) (08/2008); Lymphoma ([place]) (09/09/2011); Leukemia, acute, in remission ([place]) (2012); Metastasis to bone ([place]) (07/03/2012); Clicking tinnitus of left ear (01/02/2015); Diarrhea (02/06/2015); Pneumonia due to other specified infectious organisms (04/03/2015); Hypokalemia (04/17/2015); Right leg DVT ([place]) (05/29/2015); and S/P radiation therapy (06/05/15-06/18/15).   Presented with inability to walk for the past 3 weeks but for the past 3 days she had progresion of her weakness unable to lift her legs and reports being paralyzed from waist down with decreased sensationin lower extremeties. Patient presented to oncology who ordered MRI showing progression of T5 level metastasis now with cord compression and edema. There is also epidural tumor at level TX and L3.  On evidence of bladder distention with bilateral hydronephrosis increase in size and splenic metastasis..  Patient has known history of CLL and lymphoma as well as metastatic breast cancer currently no longer an candidate for aggressive intervention as per oncology supportive care only.  Patient and her family face advanced directive decisions and anticipatory care needs.  This NP Wadie Lessen reviewed medical records, received report from team, assessed the  patient and then meet at the patient's bedside along with her son Mehnaz Knack  to discuss diagnosis, prognosis, GOC, EOL wishes disposition and options.   A  discussion was had today regarding advanced directives.  Concepts specific to code status, artifical feeding and hydration, continued IV antibiotics and rehospitalization was had.  The difference between a aggressive medical intervention path  and a palliative comfort care path for this patient at this time was had.  Values and goals of care important to patient and family were attempted to be elicited.  We reviewed her recently documented Living Will and in accordance with her wishes understanding the limited prognosis and treatment options the patient verbalizes a desire for a full comfort path  Concept of Hospice was discussed  Natural trajectory and expectations at EOL were discussed.  Questions and concerns addressed.  Family encouraged to call with questions or concerns.  PMT will continue to support holistically.  Primary Decision Maker: Patient with support of her family    HCPOA: yes/ son Calia Artim     SUMMARY OF RECOMMENDATIONS  - focus of care is comfort ,quality and dignity, no further life prolonging measures  -hopeful for hospice facility, will write for choice   Code Status/Advance Care Planning:  DNR      Code Status Orders        Start     Ordered   10/15/15 2131  Do not attempt resuscitation (DNR)   Continuous    Question Answer Comment  In the event of cardiac or respiratory ARREST Do not call a "code blue"   In the event of cardiac or respiratory ARREST Do not perform Intubation, CPR, defibrillation or ACLS   In the event of cardiac or respiratory ARREST Use medication by any route, position, wound care, and other measures to relive pain and suffering. May use oxygen, suction and  manual treatment of airway obstruction as needed for comfort.      10/15/15 2130    Advance Directive  Documentation        Most Recent Value   Type of Advance Directive  Healthcare Power of Attorney, Living will   Pre-existing out of facility DNR order (yellow form or pink MOST form)     "MOST" Form in Place?        Other Directives:Living Will  Symptom Management:   Pain/Dyspnea: Norco 3-325 mg every 6 hrs prn, patient has been taking this prior to this admission  Anxiety- 1 mg po/sl every 4 hrs prn  Palliative Prophylaxis:   Bowel Regimen, Delirium Protocol, Frequent Pain Assessment, Oral Care and Turn Reposition  Additional Recommendations (Limitations, Scope, Preferences):  Avoid Hospitalization, Full Comfort Care, Minimize Medications, No Artificial Feeding, No Chemotherapy, No Diagnostics, No Glucose Monitoring, No IV Antibiotics, No IV Fluids, No Lab Draws and No Radiation   Psycho-social/Spiritual:  Support System: Strong   Desire for further Chaplaincy support:no- patient tells me she has her own pastors from her son's church  Additional Recommendations: Education on Hospice  Prognosis:   weeks  Discharge Planning: Hospice facility   Chief Complaint/ Primary Diagnoses: Present on Admission:  . Cord compression ([place]) . Pulmonary metastases ([place]) . Paralysis of both lower limbs ([place]) . Lymphoma, small lymphocytic ([place]) . Cancer of left breast ([place]) . Anemia . Cord compression syndrome ([place]) . Acute urinary retention  I have reviewed the medical record, interviewed the patient and family, and examined the patient. The following aspects are pertinent.  Past Medical History  Diagnosis Date  . Arthritis   . Blood transfusion 2009  . Hypertension   . Seizures ([place]) 1980's    from medication reaction/Pt.  [name] Hx of radiation therapy 09/11/08 -10/31/08    left breast  . History of radiation therapy eot 07/30/12    lumbar spine L4 /l shoulder  . Cough 10/10/2014  . Breast CA ([place]) 08/2008    (LT) breast ca dx 10/09/Chemo  . Lymphoma ([place]) 09/09/2011    NHL    . Leukemia, acute, in remission ([place]) 2012    Pt. not sure of type  . Metastasis to bone ([place]) 07/03/2012    MRI L spine  . Clicking tinnitus of left ear 01/02/2015  . Diarrhea 02/06/2015  . Pneumonia due to other specified infectious organisms 04/03/2015  . Hypokalemia 04/17/2015  . Right leg DVT ([place]) 05/29/2015  . S/P radiation therapy 06/05/15-06/18/15    right neck 25Gy/3fx   Social History   Social History  . Marital Status: Widowed    Spouse Name: N/A  . Number of Children: 3  . Years of Education: N/A   Social History Main Topics  . Smoking status: Never Smoker   . Smokeless tobacco: Never Used  . Alcohol Use: No  . Drug Use: No  . Sexual Activity: Not Currently   Other Topics Concern  . None   Social History Narrative   The patient is widowed.   Patient had 2 sons 1 daughter. The sons are in [place] and [place], [place].    The daughter lives in [place].   The patient is a nonsmoker, nondrinker, has never used smokeless tobacco products.   Family History  Problem Relation Age of Onset  . Alcohol abuse Other   . Drug abuse Other   . Breast cancer Mother   . Prostate cancer Father   . Cancer Father  colon  . Colon cancer Brother   . Esophageal cancer Neg Hx   . Rectal cancer Neg Hx   . Stomach cancer Neg Hx    Scheduled Meds: . antiseptic oral rinse  7 mL Mouth Rinse BID  . dexamethasone  6 mg Intravenous 4 times per day  . feeding supplement  1 Container Oral TID BM  . feeding supplement (PRO-STAT SUGAR FREE 64)  30 mL Oral TID WC  . nystatin  5 mL Mouth/Throat QID   Continuous Infusions:  PRN Meds:.acetaminophen **OR** acetaminophen, HYDROcodone-acetaminophen, ondansetron **OR** ondansetron (ZOFRAN) IV Medications Prior to Admission:  Prior to Admission medications   Medication Sig Start Date End Date Taking? Authorizing Provider  Amino Acids-Protein Hydrolys (FEEDING SUPPLEMENT, PRO-STAT SUGAR FREE 64,) LIQD Take 30 mLs by mouth 3  (three) times daily with meals.   Yes Historical Provider, MD  feeding supplement (BOOST / RESOURCE BREEZE) LIQD Take 1 Container by mouth 3 (three) times daily between meals. 10/02/15  Yes Verlee Monte, MD  HYDROcodone-acetaminophen (NORCO) 5-325 MG tablet Take 1 tablet by mouth every 6 (six) hours as needed for moderate pain. 10/02/15  Yes Verlee Monte, MD  nystatin (MYCOSTATIN) 100000 UNIT/ML suspension Use as directed 5 mLs in the mouth or throat 4 (four) times daily.   Yes Historical Provider, MD  predniSONE (DELTASONE) 10 MG tablet Take 1 tablet (10 mg total) by mouth daily with breakfast. 09/24/15  Yes [name], MD  predniSONE (DELTASONE) 20 MG tablet Take 3 tablets (60 mg total) by mouth daily. Take on days 2-5 of chemotherapy. Patient not taking: Reported on 09/24/2015 08/13/15   [name], MD   Allergies  Allergen Reactions  . Codeine Other (See Comments)    Reaction: seizures  . Penicillins Other (See Comments)    Reaction: Seizures Has patient had a PCN reaction causing immediate rash, facial/tongue/throat swelling, SOB or lightheadedness with hypotension:  Has patient had a PCN reaction causing severe rash involving mucus membranes or skin necrosis: no Has patient had a PCN reaction that required hospitalization Yes Has patient had a PCN reaction occurring within the last 10 years: No If all of the above answers are "NO", then may proceed with Cephalosporin use.   . Sulfonamide Derivatives Itching  . Tape Rash  . Tramadol Hcl Rash    Review of Systems  Constitutional: Positive for activity change, appetite change and fatigue.  Cardiovascular: Positive for leg swelling.  Musculoskeletal: Positive for back pain.  Neurological: Positive for weakness and numbness.    Physical Exam  Constitutional: Vital signs are normal. She appears cachectic. She is cooperative. She appears ill.  HENT:  Head: Normocephalic and atraumatic.  + temporal muscle wasting  Respiratory: She  has decreased breath sounds in the right lower field and the left lower field.  Musculoskeletal:  BLE with decreased sensation and strength 0/5  Neurological: She is alert.  Skin: Skin is warm and dry.    Vital Signs: BP 121/59 mmHg  Pulse 74  Temp(Src) 98.1 F (36.7 C) (Oral)  Resp 20  Ht 5\' 2"  (1.575 m)  Wt 51.71 kg (114 lb)  BMI 20.85 kg/m2  SpO2 100%  SpO2: SpO2: 100 % O2 Device:SpO2: 100 % O2 Flow Rate: .   IO: Intake/output summary:  Intake/Output Summary (Last 24 hours) at 10/17/15 0954 Last data filed at 10/17/15 [id number]  Gross per 24 hour  Intake    120 ml  Output   2650 ml  Net  -2530 ml  LBM: Last BM Date: 10/15/15 (night shift RN stated pt had small BM 12-12) Baseline Weight: Weight: 51.71 kg (114 lb) Most recent weight: Weight: 51.71 kg (114 lb)      Palliative Assessment/Data:  Flowsheet Rows        Most Recent Value   Intake Tab    Referral Department  Hospitalist   Unit at Time of Referral  Oncology Unit   Palliative Care Primary Diagnosis  Cancer   Date Notified  10/15/15   Palliative Care Type  Return patient Palliative Care   Reason for referral  Clarify Goals of Care   Date of Admission  10/15/15   # of days IP prior to Palliative referral  0   Clinical Assessment    Psychosocial & Spiritual Assessment    Palliative Care Outcomes       Additional Data Reviewed:  CBC:    Component Value Date/Time   WBC 6.9 10/16/2015 0420   WBC 5.9 10/15/2015 0954   WBC 7.3 08/14/2010 1251   HGB 9.1* 10/16/2015 0420   HGB 7.7* 10/15/2015 0954   HGB 12.5 08/14/2010 1251   HCT 28.0* 10/16/2015 0420   HCT 25.0* 10/15/2015 0954   HCT 36.6 08/14/2010 1251   PLT 122* 10/16/2015 0420   PLT 127* 10/15/2015 0954   PLT 216 08/14/2010 1251   MCV 81.9 10/16/2015 0420   MCV 81.1 10/15/2015 0954   MCV 84 08/14/2010 1251   NEUTROABS 6.3 10/16/2015 0420   NEUTROABS 5.2 10/15/2015 0954   NEUTROABS 4.1 08/14/2010 1251   LYMPHSABS 0.3* 10/16/2015 0420    LYMPHSABS 0.1* 10/15/2015 0954   LYMPHSABS 2.5 08/14/2010 1251   MONOABS 0.3 10/16/2015 0420   MONOABS 0.6 10/15/2015 0954   EOSABS 0.0 10/16/2015 0420   EOSABS 0.0 10/15/2015 0954   EOSABS 0.2 08/14/2010 1251   BASOSABS 0.0 10/16/2015 0420   BASOSABS 0.0 10/15/2015 0954   BASOSABS 0.0 08/14/2010 1251   Comprehensive Metabolic Panel:    Component Value Date/Time   NA 138 10/16/2015 0420   NA 137 09/21/2015 0843   NA 142 06/01/2012 1008   K 4.2 10/16/2015 0420   K 3.2* 09/21/2015 0843   K 3.8 06/01/2012 1008   CL 108 10/16/2015 0420   CL 107 04/22/2013 0922   CL 103 06/01/2012 1008   CO2 21* 10/16/2015 0420   CO2 29 09/21/2015 0843   CO2 25 06/01/2012 1008   BUN 26* 10/16/2015 0420   BUN 4.3* 09/21/2015 0843   BUN 9 06/01/2012 1008   CREATININE 0.66 10/16/2015 0420   CREATININE 0.6 09/21/2015 0843   CREATININE 0.9 06/01/2012 1008   GLUCOSE 133* 10/16/2015 0420   GLUCOSE 96 09/21/2015 0843   GLUCOSE 89 04/22/2013 0922   GLUCOSE 108 06/01/2012 1008   CALCIUM 9.2 10/16/2015 0420   CALCIUM 9.2 09/21/2015 0843   CALCIUM 9.8 06/01/2012 1008   AST 71* 10/16/2015 0420   AST 20 09/21/2015 0843   AST 19 08/14/2010 1251   ALT 80* 10/16/2015 0420   ALT 11 09/21/2015 0843   ALT 9* 08/14/2010 1251   ALKPHOS 162* 10/16/2015 0420   ALKPHOS 87 09/21/2015 0843   ALKPHOS 78 08/14/2010 1251   BILITOT 0.7 10/16/2015 0420   BILITOT 0.42 09/21/2015 0843   BILITOT 0.40 08/14/2010 1251   PROT 5.3* 10/16/2015 0420   PROT 5.7* 09/21/2015 0843   PROT 8.1 08/14/2010 1251   ALBUMIN 2.2* 10/16/2015 0420   ALBUMIN 2.7* 09/21/2015 0843   ALBUMIN 3.4 08/14/2010  1251     Time In: 1400 Time Out: 1520 Time Total: 80 min Greater than 50%  of this time was spent counseling and coordinating care related to the above assessment and plan.  Signed by: Wadie Lessen, NP  Knox Royalty, NP  10/17/2015, 9:54 AM  Please contact Palliative Medicine Team phone at [phone] for questions and  concerns.

## 2015-10-17 NOTE — Clinical Social Work Note (Signed)
Clinical Social Work Assessment  Patient Details  Name: Helen [name] MRN: [id number] Date of Birth: 1934-12-04  Date of referral:  10/17/15               Reason for consult:  Discharge Planning                Permission sought to share information with:  Family Supports Permission granted to share information::  Yes, Verbal Permission Granted  Name::     Helen [name]  Agency::     Relationship::  son  Contact Information:  [phone]  Housing/Transportation Living arrangements for the past 2 months:  [place] of Information:  Patient, Adult Children Patient Interpreter Needed:  None Criminal Activity/Legal Involvement Pertinent to Current Situation/Hospitalization:  No - Comment as needed Significant Relationships:  Adult Children Lives with:  Facility Resident Do you feel safe going back to the place where you live?  No Need for family participation in patient care:  Yes (Comment)  Care giving concerns:  Pt admitted from [facility] and [place]. PMT [place] held with pt and pt son and recommendation for residential hospice placement.   Social Worker assessment / plan:  CSW received referral for residential hospice.  CSW met with pt and pt son, Evette Doffing at bedside. CSW introduced self and explained role. Pt admitted from [place], but pt and pt son met with PMT NP and established plan to transition to comfort care and recommendation for residential hospice. CSW discussed residential hospice placement and offered choice. Pt and pt son choose [facility] as first choice. Pt and pt son state that [facility] is preferred, but if pt unable to obtain bed at [facility] then second choice would be [facility]. CSW discussed process of referrals and notified pt and pt son that CSW will make referral to [facility] Pt and pt son expressed understanding.  CSW made referral to [facility]. [facility] to process  referral and notify CSW of decision.   CSW to continue to follow to provide support and assist with pt disposition needs.   Employment status:  Retired Forensic scientist:  Medicare PT Recommendations:  Not assessed at this time [place] / Referral to community resources:  Other (Comment Required) (residential hospice referral)  Patient/Family's Response to care:  Pt alert and oriented x 4. Pt and pt son agreeable to residential hospice placement. Pt and pt son coping appropriately with transition to comfort care.   Patient/Family's Understanding of and Emotional Response to Diagnosis, Current Treatment, and Prognosis:  Pt and pt son displayed understanding surrounding pt diagnosis and prognosis. Pt and pt son coping appropriately with plan to explore residential hospice placement.  Emotional Assessment Appearance:  Appears stated age Attitude/Demeanor/Rapport:  Other (pt appropriate) Affect (typically observed):  Appropriate, Accepting Orientation:  Oriented to Self, Oriented to Place, Oriented to  Time, Oriented to Situation Alcohol / Substance use:  Not Applicable Psych involvement (Current and /or in the community):  No (Comment)  Discharge Needs  Concerns to be addressed:  Discharge Planning Concerns Readmission within the last 30 days:  No Current discharge risk:  Terminally ill Barriers to Discharge:  Continued Medical Work up   Alison Murray A, LCSW 10/17/2015, 2:50 PM  [phone]

## 2015-10-18 DIAGNOSIS — G893 Neoplasm related pain (acute) (chronic): Secondary | ICD-10-CM

## 2015-10-18 DIAGNOSIS — R627 Adult failure to thrive: Secondary | ICD-10-CM

## 2015-10-18 DIAGNOSIS — G8220 Paraplegia, unspecified: Secondary | ICD-10-CM

## 2015-10-18 NOTE — Progress Notes (Signed)
Helen [name] [id number] DOB: 15-Sep-1935 DOA: 10/15/2015 PCP: [name], MD  Brief narrative: 80 y/o ? stg IV Small cell lymphocytic lymphoma Advanced Met Br Ca [not candidate for syst Chemo] R-IJ veins thrmobiosis off of AC 2/2 to gum bleeds Adult FTT 2/2 to cancer Recent admission 11/24-11/29/16 Le swelling  Represented from Oncology office with acute LE paralysis and concern for t5 mets and epidural tumour L3 Place don steorids Rad-ONc and palliative consulted    Past medical history-As per Problem list Chart reviewed as below-   Consultants:  Pallaitive  Rad Onc  Procedures:  none  Antibiotics:  none   Subjective  Alert ,  look comfortable, denies pain ,    Objective    Interim History:   Telemetry:    Objective: Filed Vitals:   10/17/15 1441 10/17/15 2129 10/18/15 0515 10/18/15 1251  BP: 121/58 127/58 128/60 129/65  Pulse: 100 116 112 100  Temp: 98.1 F (36.7 C) 98.4 F (36.9 C) 98.2 F (36.8 C) 97.9 F (36.6 C)  TempSrc: Oral Oral Oral Oral  Resp: 20 20 18 18   Height:      Weight:      SpO2: 100% 100% 100% 100%    Intake/Output Summary (Last 24 hours) at 10/18/15 1747 Last data filed at 10/18/15 1252  Gross per 24 hour  Intake   1160 ml  Output   1800 ml  Net   -640 ml    Exam:  General: EOMI NCAT , tender to palpation to chest wall Cardiovascular: S1-S2 no murmur rub or gallop  Respiratory: Clinically clear no added sound  Abdomen: Soft nontender nondistended no rebound  Skinno lower extremity edema-noted to have decubiti which were not examined this morning  Neurocannot move lower extremities, no sensation bilateral lower extremity  Data Reviewed: Basic Metabolic Panel:  Recent Labs Lab 10/15/15 1222 10/16/15 0420  NA 137 138  K 4.1 4.2  CL 108 108  CO2 20* 21*  GLUCOSE 151* 133*  BUN 36* 26*  CREATININE 0.83 0.66  CALCIUM 9.0 9.2  MG  --  1.8  PHOS  --  3.2   Liver Function Tests:  Recent Labs Lab  10/15/15 1222 10/16/15 0420  AST 61* 71*  ALT 71* 80*  ALKPHOS 168* 162*  BILITOT 0.4 0.7  PROT 4.9* 5.3*  ALBUMIN 2.1* 2.2*   No results for input(s): LIPASE, AMYLASE in the last 168 hours. No results for input(s): AMMONIA in the last 168 hours. CBC:  Recent Labs Lab 10/15/15 0954 10/15/15 1222 10/16/15 0420  WBC 5.9 6.2 6.9  NEUTROABS 5.2 5.4 6.3  HGB 7.7* 7.0* 9.1*  HCT 25.0* 22.3* 28.0*  MCV 81.1 82.6 81.9  PLT 127* 118* 122*   Cardiac Enzymes: No results for input(s): CKTOTAL, CKMB, CKMBINDEX, TROPONINI in the last 168 hours. BNP: Invalid input(s): POCBNP CBG: No results for input(s): GLUCAP in the last 168 hours.  Recent Results (from the past 240 hour(s))  TECHNOLOGIST REVIEW     Status: None   Collection Time: 10/15/15  9:54 AM  Result Value Ref Range Status   Technologist Review mod. ovalos present  Final     Studies:              All Imaging reviewed and is as per above notation   Scheduled Meds: . antiseptic oral rinse  7 mL Mouth Rinse BID  . dexamethasone  6 mg Intravenous 4 times per day  . feeding supplement  1 Container Oral TID  BM  . feeding supplement (PRO-STAT SUGAR FREE 64)  30 mL Oral TID WC  . nystatin  5 mL Mouth/Throat QID   Continuous Infusions:    Assessment/Plan:   Central cord impingement-await radiology and palliative impression. 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

## 2015-10-18 NOTE — Care Management Important Message (Signed)
Important Message  Patient Details  Name: Madissyn Roberds MRN: [id number] Date of Birth: 10-20-1935   Medicare Important Message Given:  Yes    Camillo Flaming 10/18/2015, 12:12 [place] Message  Patient Details  Name: Laraine Margiotta MRN: [id number] Date of Birth: 12/11/1934   Medicare Important Message Given:  Yes    Camillo Flaming 10/18/2015, 12:12 PM

## 2015-10-18 NOTE — Progress Notes (Signed)
CSW continuing to follow.   CSW received notification from [facility], [facility] that [facility] does not have availability today.   CSW made referral to [facility] [place]. Pt son had expressed that this is second option.  Await [facility] evaluation.   CSW to continue to follow.  Alison Murray, MSW, [place] Work [phone]

## 2015-10-18 NOTE — Progress Notes (Signed)
CSW continuing to follow.  [facility] came to hospital to evaluate pt and pt accepted to [facility]. Bed available tomorrow morning and pt family will be available to accompany pt for the transition. Consent paperwork completed today with [facility] liaison.   [facility] request goal will be for ambulance transport to be arranged on Friday 10/19/15 at 9:30am by PTAR to transfer to [place] at [facility] [address]. Arlean Hopping [phone]). [facility] liaison provided discharge packet in pt shadow chart for discharge summary, DNR, and transport form to be added.  CSW notified MD.  CSW to facilitate pt discharge needs to [facility] in the morning.  Alison Murray, MSW, [place] Work [phone]

## 2015-10-18 NOTE — Consult Note (Signed)
[facility] referral from [place].  Met with pt and son-Vincent Hally at the bedside today to discuss Hospice services.  Pt was most recently a resident at [facility] where she attempted rehab.  Pt and son do not feel return to [facility] is an option at this point.  They also do not feel pt can return home or to her children's homes due to her advanced care needs.  We reviewed services at [place] at [facility].  Reviewed philosophy of comfort care, multi-disciplinary team approach to care, 2 levels-of-care and financial subsidy.  Obtained copies of pt's LW and HCPOA.  Pt reaffirms her wish to be a DNR and signed DNR is on her shadow chart.   Extended bed offer for transfer to [facility] which pt and family have accepted.  They ask that transfer date be Friday 10/19/15 so that family can accompany her for this transition.  Son-Vincent has signed pt's requisite consent papers today.  Goal will be for pt pick up @ [facility] on Friday 10/19/15 at 9:30am by PTAR to transfer to [place] at [facility] Arlean Hopping [phone]).  SW-Susanna and RN-Cindy are aware of this plan.  Thank you for referral and opportunity to serve pt and family.  Wynetta Fines, RN

## 2015-10-19 MED ORDER — DEXAMETHASONE 4 MG PO TABS: 4.0000 mg | ORAL_TABLET | Freq: Four times a day (QID) | ORAL | Status: AC

## 2015-10-19 MED ORDER — HYDROCODONE-ACETAMINOPHEN 5-325 MG PO TABS: 1.0000 | ORAL_TABLET | Freq: Four times a day (QID) | ORAL | Status: AC | PRN

## 2015-10-19 MED ORDER — LORAZEPAM 1 MG PO TABS: 1.0000 mg | ORAL_TABLET | ORAL | Status: AC | PRN

## 2015-10-19 NOTE — Progress Notes (Signed)
Pt d/c to [place] [facility] this morning. [facility] transport required. Pt / family are in agreement with d/c plan. [name], Evette Doffing , contacted this am to confirm d/c time. D/C Summary, medical necessity form, scripts and DNR included in d/c packet.  Werner Lean LCSW [phone]

## 2015-10-19 NOTE — Progress Notes (Signed)
Called report to [facility] to nurse named Gerald Stabs. Pt's port has not been de-accessed and foley is still in place per Hospice request. Transport should be coming soon. Belongings have been collected and placed in bags for departure

## 2015-10-19 NOTE — Discharge Summary (Signed)
Discharge Summary  Helen [name] [id number] DOB: [birth year]  PCP: [name], MD  Admit date: 10/15/2015 Discharge date: 10/19/2015  Time spent: <58mins  Recommendations for Outpatient Follow-up:  1. F/u with PMD as needed 2. Patient is discharged to [facility]  Discharge Diagnoses:  Active Hospital Problems   Diagnosis Date Noted  . Pain, cancer 10/17/2015  . Adult failure to thrive   . Pressure ulcer 10/16/2015  . Cord compression ([place]) 10/15/2015  . Paralysis of both lower limbs ([place]) 10/15/2015  . Anemia 10/15/2015  . Cord compression syndrome ([place]) 10/15/2015  . Acute urinary retention 10/15/2015  . Pulmonary metastases ([place]) 08/08/2014  . Lymphoma, small lymphocytic ([place]) 09/09/2011  . Cancer of left breast ([place]) 08/03/2008    Resolved Hospital Problems   Diagnosis Date Noted Date Resolved  No resolved problems to display.    Discharge Condition: stable  Diet recommendation: regular diet  Filed Weights   10/15/15 1040  Weight: 114 lb (51.71 kg)    History of present illness:  Helen [name] is a 80 y.o. female   has a past medical history of Arthritis; Blood transfusion (2009); Hypertension; Seizures ([place]) (1980's); radiation therapy (09/11/08 -10/31/08); History of radiation therapy ([place] 07/30/12); Cough (10/10/2014); Breast CA ([place]) (08/2008); Lymphoma ([place]) (09/09/2011); Leukemia, acute, in remission ([place]) (2012); Metastasis to bone ([place]) (07/03/2012); Clicking tinnitus of left ear (01/02/2015); Diarrhea (02/06/2015); Pneumonia due to other specified infectious organisms (04/03/2015); Hypokalemia (04/17/2015); Right leg DVT ([place]) (05/29/2015); and S/P radiation therapy (06/05/15-06/18/15).   Presented with inability to walk for the past 3 weeks but for the past 3 days she had progresion of her weakness unable to lift her legs and reports being paralyzed from waist down with decreased sensationin lower extremeties. Patient Presented to oncology who ordered  MRI showing progression of T5 level metastasis now with cord compression and edema. There is also epidural tumor at level TX and L3. On evidence of bladder distention with bilateral hydronephrosis increase in size and splenic metastasis.. Patient also reports swelling in the left side of her face and neck which she thought was secondary to tooth infection. This appears to be not painful she denies any history of fevers although occasionally has some chills. She has been having consistent cough has been going on for quite a few months. Of note patient does have known history of pulmonary metastases. ER provider has discussed case with radiation oncology who will see patient in consult tomorrow for palliative intervention.   Of note patient describes incontinence and had been having breakdown on her sacral area. Seems it has been going on for few weeks as well.   Of note patient has known history of CLL and lymphoma as well as metastatic breast cancer currently no longer an candidate for aggressive intervention as per oncology supportive care only.  Hospitalist was called for admission for cord compression.   Hospital Course:  Active Problems:   Lymphoma, small lymphocytic ([place])   Cancer of left breast (HCC)   Pulmonary metastases (HCC)   Paralysis of both lower limbs (HCC)   Cord compression (HCC)   Anemia   Cord compression syndrome (HCC)   Acute urinary retention   Pressure ulcer   Pain, cancer   Adult failure to thrive  Central cord impingement- received iv  Decadron 6 mg 4 times a day, Ms. Heart has been non ambulatory for at least 3 weeks. she discussed with radiation oncology, This limits the recovery of function in the cord compression setting with  radiation alone (<20%). Her only real chance of long term success is with surgery and radiation but she is not a surgical candidate.patient and family decided to discharge to Hospice facility for symptom management. At discharge, she  denies pain.  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   Adult failure to thrive secondary to cancer  Possible thrush-continue nystatin 5 mils 4 times a day  Consultants:  [facility]  Rad Onc  Procedures:  none  Antibiotics:  none   Discharge Exam: BP 118/57 mmHg  Pulse 107  Temp(Src) 97.6 F (36.4 C) (Oral)  Resp 18  Ht 5\' 2"  (1.575 m)  Wt 114 lb (51.71 kg)  BMI 20.85 kg/m2  SpO2 100%  General: EOMI NCAT , tender to palpation to chest wall Cardiovascular: S1-S2 no murmur rub or gallop  Respiratory: Clinically clear no added sound  Abdomen: Soft nontender nondistended no rebound  Skinno lower extremity edema-noted to have decubiti which were not examined this morning  Neurocannot move lower extremities, no sensation bilateral lower extremity, bilateral lower extremity pitting edema   Discharge Instructions You were cared for by a hospitalist during your hospital stay. If you have any questions about your discharge medications or the care you received while you were in the hospital after you are discharged, you can call the unit and asked to speak with the hospitalist on call if the hospitalist that took care of you is not available. Once you are discharged, your primary care physician will handle any further medical issues. Please note that NO REFILLS for any discharge medications will be authorized once you are discharged, as it is imperative that you return to your primary care physician (or establish a relationship with a primary care physician if you do not have one) for your aftercare needs so that they can reassess your need for medications and monitor your lab values.  Discharge Instructions    Diet - low sodium heart healthy     Complete by:  As directed      Increase activity slowly    Complete by:  As directed             Medication List    STOP taking these medications        predniSONE 10 MG tablet  Commonly known as:  DELTASONE     predniSONE 20 MG tablet  Commonly known as:  DELTASONE      TAKE these medications        dexamethasone 4 MG tablet  Commonly known as:  DECADRON  Take 1 tablet (4 mg total) by mouth 4 (four) times daily.     feeding supplement (PRO-STAT SUGAR FREE 64) Liqd  Take 30 mLs by mouth 3 (three) times daily with meals.     feeding supplement Liqd  Take 1 Container by mouth 3 (three) times daily between meals.     HYDROcodone-acetaminophen 5-325 MG tablet  Commonly known as:  NORCO  Take 1 tablet by mouth every 6 (six) hours as needed for moderate pain.     LORazepam 1 MG tablet  Commonly known as:  ATIVAN  Take 1 tablet (1 mg total) by mouth every 4 (four) hours as needed for anxiety.     nystatin 100000 UNIT/ML suspension  Commonly known as:  MYCOSTATIN  Use as  directed 5 mLs in the mouth or throat 4 (four) times daily.       Allergies  Allergen Reactions  . Codeine Other (See Comments)    Reaction: seizures  . Penicillins Other (See Comments)    Reaction: Seizures Has patient had a PCN reaction causing immediate rash, facial/tongue/throat swelling, SOB or lightheadedness with hypotension:  Has patient had a PCN reaction causing severe rash involving mucus membranes or skin necrosis: no Has patient had a PCN reaction that required hospitalization Yes Has patient had a PCN reaction occurring within the last 10 years: No If all of the above answers are "NO", then may proceed with Cephalosporin use.   . Sulfonamide Derivatives Itching  . Tape Rash  . Tramadol Hcl Rash      The results of significant diagnostics from this hospitalization (including imaging, microbiology, ancillary and laboratory) are listed below for reference.    Significant Diagnostic  Studies: Dg Chest 2 View  10/15/2015  CLINICAL DATA:  80 year old female with lower extremity numbness and weakness EXAM: CHEST  2 VIEW COMPARISON:  Most recent prior chest CT 09/21/2015 FINDINGS: Right IJ approach single-lumen power injectable port catheter. The catheter tip projects over the superior cavoatrial junction. The cardiac and mediastinal contours are within normal limits. Atherosclerotic calcifications are present in the transverse aorta. Masslike opacity in the periphery of the left lower lobe is similar compared to prior imaging. Biapical pleural parenchymal scarring is also similar compared to prior. Stable moderate right pleural effusion. No new airspace consolidation. Mottled lucency in the right humeral head concerning for osseous metastatic disease. Limited evaluation of the vertebral bodies on the lateral view. No definite compression fracture on the frontal view. IMPRESSION: 1. Stable chest x-ray without evidence of acute cardiopulmonary process. 2. Right IJ approach single-lumen power injectable port catheter with the tip overlying the superior cavoatrial junction. 3. Osseous metastasis in the right humeral head. Electronically Signed   By: Jacqulynn Cadet M.D.   On: 10/15/2015 21:10   Ct Soft Tissue Neck W Contrast  10/15/2015  CLINICAL DATA:  Cough with left-sided neck mass. History of metastatic breast cancer. Non-Hodgkin's lymphoma. EXAM: CT NECK WITH CONTRAST TECHNIQUE: Multidetector CT imaging of the neck was performed using the standard protocol following the bolus administration of intravenous contrast. CONTRAST:  17mL OMNIPAQUE IOHEXOL 300 MG/ML  SOLN COMPARISON:  01/13/2015 . FINDINGS: Pharynx and larynx: underdistention involving the nasopharynx. Oral pharynx and larynx unremarkable. Salivary glands: Submandibular glands unremarkable. Small node in the left parotid gland is nonspecific. Thyroid: Normal Lymph nodes: New and progressive cervical adenopathy. Index left-sided  posterior triangle node measures 2.0 x 1.4 cm on image 37/series 2. A left jugulodigastric nodal conglomerate measures 2.5 x 1.2 cm on image 25/series 2. Left submandibular adenopathy including at 2.8 x 3.3 cm on image 46/series 2. The previously described right supraclavicular adenopathy is resolved. Left supraclavicular adenopathy is new. Vascular: Atherosclerosis involving both carotid bulbs. Limited intracranial: Unremarkable. Visualized orbits: Only minimally included. Mastoids and visualized paranasal sinuses: Clear. Skeleton: Cervical spondylosis at C5-6. Upper chest: probable radiation fibrosis in the left apex. Minimal ground-glass opacity in the right apex is nonspecific but may also be radiation induced. No imaged upper mediastinal adenopathy. Right subclavian central line. IMPRESSION: 1. Marked left-sided cervical adenopathy, most consistent with progressive metastasis or lymphoma. Right supraclavicular adenopathy detailed on the prior exam has resolved. 2. Extensive atherosclerosis. Electronically Signed   By: Abigail Miyamoto M.D.   On: 10/15/2015 21:26   Ct Chest W Contrast  09/21/2015  CLINICAL DATA:  Subsequent encounter for lymphoma with concurrent breast cancer. EXAM: CT CHEST, ABDOMEN, AND PELVIS WITH CONTRAST TECHNIQUE: Multidetector CT imaging of the chest, abdomen and pelvis was performed following the standard protocol during bolus administration of intravenous contrast. CONTRAST:  129mL OMNIPAQUE IOHEXOL 300 MG/ML  SOLN COMPARISON:  07/02/2015. FINDINGS: CT CHEST FINDINGS Mediastinum/Lymph Nodes: Right Port-A-Cath tip is at the SVC/ RA junction. There has been marked interval decrease in right axillary lymphadenopathy. 3.3 cm short axis right axillary lymph node measured previously is now 1.2 cm. Anterior right axillary lymph node which was 2.6 cm (my measurement) on the previous study is now 1.1 cm. 11 mm right paratracheal lymph node measured previously has resolved completely. 2.1 cm  necrotic subcarinal lymph node is new in the interval. 13 mm short axis left hilar lymph node was 9 mm previously. Heart size is normal. No pericardial effusion. Lungs/Pleura: Left apical bronchiectasis and scarring is unchanged. There is new airspace disease in the right apex, likely secondary to previous radiation treatment of lymphadenopathy in the right neck the masslike area of airspace consolidation in the left base has decreased, measuring 3.7 x 2.6 cm today compared to 6.0 x 3.8 cm previously. There is right base collapse/ consolidation. Small right pleural effusion has progressed in the interval. Musculoskeletal: Previously described lucent lesion in the right humeral neck is stable. CT ABDOMEN PELVIS FINDINGS Hepatobiliary: Metastatic lesion in the dome of the liver measures 2.8 x 2.6 cm today compared to 2.6 x 2.1 cm previously. Index lesion measured previously in the inferior right liver had a bilobed appearance. The more medial moiety of this lesion has decreased substantially in size in the interval while the more lateral portion of this lesion is unchanged. Overall measurements today are 3.7 x 3.9 cm compared to 3.8 x 5.5 cm previously. Index lesion measured in the lateral segment left liver previously at 2.9 x 1.6 cm now measures 2.7 x 1.8 cm. On today's exam, there is a 7 mm lesion in the medial segment left liver (image 52 series 2) and this same lesion was 1.8 cm on the previous study. Large stone again identified in the gallbladder. Mild biliary duct prominence is stable. Pancreas: No focal mass lesion. No dilatation of the main duct. No intraparenchymal cyst. No peripancreatic edema. Spleen: 11 mm lesion in the spleen on the previous study has resolved in the interval, but there are multiple new and enlarging splenic lesions. 1.5 cm lesion in the central spleen (image 43 series 2) on today's study was 4 mm previously. 11 mm lesion in the anterior spleen (image 45) is new in the interval.  Adrenals/Urinary Tract: No adrenal nodule or mass. Multiple cysts seen in the kidneys bilaterally are stable. No evidence for hydroureter. The urinary bladder appears normal for the degree of distention. Stomach/Bowel: Stomach is nondistended. No gastric wall thickening. No evidence of outlet obstruction. Duodenum is normally positioned as is the ligament of Treitz. No small bowel wall thickening. No small bowel dilatation. The terminal ileum is normal. No gross colonic mass. No colonic wall thickening. No substantial diverticular change. Vascular/Lymphatic: 10 mm left retrocrural lymph node has progressed in the interval. No gastrohepatic or hepatoduodenal ligament lymphadenopathy. No para-aortic lymphadenopathy. 11 mm short axis right pelvic sidewall lymph node measured previously has progressed at 21 mm. The 16 mm left pelvic sidewall lymph nodes seen previously is now 25 mm. Bilateral inguinal lymphadenopathy has progressed as well. The 11 mm index lymph node in the right  inguinal region measured previously is now 28 mm. Reproductive: Fibroid change noted in the uterus. There is no adnexal mass. Other: No intraperitoneal free fluid. Musculoskeletal: 2.7 x 1.8 cm right iliac lesion on the previous study is not substantially changed measuring 3.1 x 1.9 cm. Other areas of metastatic involvement in the bony pelvis are again noted. IMPRESSION: 1. Mixed interval response to therapy. 2. Right subpectoral and axillary lymphadenopathy shows substantial interval decrease in size. Upper mediastinal lymphadenopathy has resolved. However, new mediastinal lymphadenopathy is now evident and pelvic lymphadenopathy shows interval progression. 3. Multiple liver metastases, some of which are improved and others of which are stable. 4. Index splenic lesion seen on the previous study has resolved but new splenic lesions are evident on today's exam. 5. No substantial change in previously identified bony lesions. 6. Cholelithiasis.  Electronically Signed   By: Misty Stanley M.D.   On: 09/21/2015 10:36   Mr Thoracic Spine W Wo Contrast  10/15/2015  CLINICAL DATA:  Metastatic breast cancer. Numbness and paralysis from the day waist down beginning 3 or 4 days ago. EXAM: MRI THORACIC AND LUMBAR SPINE WITHOUT AND WITH CONTRAST TECHNIQUE: Multiplanar and multiecho pulse sequences of the thoracic and lumbar spine were obtained without and with intravenous contrast. CONTRAST:  43mL MULTIHANCE GADOBENATE DIMEGLUMINE 529 MG/ML IV SOLN COMPARISON:  Total spine MRI 08/11/2014. CT chest, abdomen, and pelvis 09/21/2015. FINDINGS: MR THORACIC SPINE FINDINGS There are multiple new osseous metastases involving vertebral bodies and posterior elements throughout the thoracic spine compared to the prior MRI. The T5 vertebral body is completely involved by tumor with extension to the posterior elements. There is prominent paravertebral soft tissue tumor bilaterally extending from T4-T6. There is extensive epidural tumor extending from T3 to T6-7, bulkiest from T4-T6 where there is circumferential tumor resulting in severe spinal stenosis. The spinal cord appears markedly edematous through this region, with edema extending cephalad to T1-2 and caudally to T8. The T10 vertebral body is now diffusely involved by tumor. There is minimal ventral epidural tumor at this level without associated spinal stenosis. There is a small right pleural effusion. Subcarinal nodal mass and patchy areas of abnormal signal throughout both lungs are better evaluated on last month's chest CT. MR LUMBAR SPINE FINDINGS Transitional lumbosacral anatomy with numbering preserved from the prior study. L3 vertebral body lesion has mildly increased in size. There is a new lesion involving much of the left half of the L4 vertebral body. There is a new approximately 2 cm lesion in the left superior aspect of L5. There are new sacral lesions with multiple iliac bone lesions partially visualized.  Trace ventral epidural tumor at L3 may have minimally increased from the prior MRI. No definite epidural tumor is identified elsewhere in the lumbar spine. Chronic anterolisthesis is again seen of L4 on L5 measuring 12 mm, not significantly changed. There is mild-to-moderate bilateral neural foraminal stenosis at L4-5. Slight anterolisthesis is again seen of L3 on L4 with disc uncovering and posterior element hypertrophy and fusion resulting in moderate to severe spinal stenosis, similar to prior. There is marked bladder distention, incompletely visualized with moderate right greater than left hydronephrosis. Bilateral renal cysts are again noted. A 2.1 cm lesion in the spleen has increased in size from the prior CT (previously 1.5 cm). Liver metastases are again partially visualized. Gallstones are partially visualized. IMPRESSION: 1. Interval progression of osseous spinal metastases, greatest in the thoracic spine. 2. Completely replaced T5 vertebral body with extensive epidural tumor resulting in severe  spinal stenosis and extensive cord edema. Cord edema is felt to be due to epidural tumor and compression without definite intramedullary tumor identified though difficult to completely exclude. 3. Minimal epidural tumor at T10 without stenosis. 4. Minimal epidural tumor at L3, at most minimally increased from the prior MRI. No stenosis. 5. Marked bladder distention with moderate bilateral hydronephrosis. 6. Increased size of splenic metastasis. Critical Value/emergent results were called by telephone at the time of interpretation on 10/15/2015 at 4:29 pm to Dr. Quintella Reichert , who verbally acknowledged these results. Electronically Signed   By: Logan Bores M.D.   On: 10/15/2015 16:32   Mr Lumbar Spine W Wo Contrast  10/15/2015  CLINICAL DATA:  Metastatic breast cancer. Numbness and paralysis from the day waist down beginning 3 or 4 days ago. EXAM: MRI THORACIC AND LUMBAR SPINE WITHOUT AND WITH CONTRAST  TECHNIQUE: Multiplanar and multiecho pulse sequences of the thoracic and lumbar spine were obtained without and with intravenous contrast. CONTRAST:  51mL MULTIHANCE GADOBENATE DIMEGLUMINE 529 MG/ML IV SOLN COMPARISON:  Total spine MRI 08/11/2014. CT chest, abdomen, and pelvis 09/21/2015. FINDINGS: MR THORACIC SPINE FINDINGS There are multiple new osseous metastases involving vertebral bodies and posterior elements throughout the thoracic spine compared to the prior MRI. The T5 vertebral body is completely involved by tumor with extension to the posterior elements. There is prominent paravertebral soft tissue tumor bilaterally extending from T4-T6. There is extensive epidural tumor extending from T3 to T6-7, bulkiest from T4-T6 where there is circumferential tumor resulting in severe spinal stenosis. The spinal cord appears markedly edematous through this region, with edema extending cephalad to T1-2 and caudally to T8. The T10 vertebral body is now diffusely involved by tumor. There is minimal ventral epidural tumor at this level without associated spinal stenosis. There is a small right pleural effusion. Subcarinal nodal mass and patchy areas of abnormal signal throughout both lungs are better evaluated on last month's chest CT. MR LUMBAR SPINE FINDINGS Transitional lumbosacral anatomy with numbering preserved from the prior study. L3 vertebral body lesion has mildly increased in size. There is a new lesion involving much of the left half of the L4 vertebral body. There is a new approximately 2 cm lesion in the left superior aspect of L5. There are new sacral lesions with multiple iliac bone lesions partially visualized. Trace ventral epidural tumor at L3 may have minimally increased from the prior MRI. No definite epidural tumor is identified elsewhere in the lumbar spine. Chronic anterolisthesis is again seen of L4 on L5 measuring 12 mm, not significantly changed. There is mild-to-moderate bilateral neural  foraminal stenosis at L4-5. Slight anterolisthesis is again seen of L3 on L4 with disc uncovering and posterior element hypertrophy and fusion resulting in moderate to severe spinal stenosis, similar to prior. There is marked bladder distention, incompletely visualized with moderate right greater than left hydronephrosis. Bilateral renal cysts are again noted. A 2.1 cm lesion in the spleen has increased in size from the prior CT (previously 1.5 cm). Liver metastases are again partially visualized. Gallstones are partially visualized. IMPRESSION: 1. Interval progression of osseous spinal metastases, greatest in the thoracic spine. 2. Completely replaced T5 vertebral body with extensive epidural tumor resulting in severe spinal stenosis and extensive cord edema. Cord edema is felt to be due to epidural tumor and compression without definite intramedullary tumor identified though difficult to completely exclude. 3. Minimal epidural tumor at T10 without stenosis. 4. Minimal epidural tumor at L3, at most minimally increased from the  prior MRI. No stenosis. 5. Marked bladder distention with moderate bilateral hydronephrosis. 6. Increased size of splenic metastasis. Critical Value/emergent results were called by telephone at the time of interpretation on 10/15/2015 at 4:29 pm to Dr. Quintella Reichert , who verbally acknowledged these results. Electronically Signed   By: Logan Bores M.D.   On: 10/15/2015 16:32   Ct Abdomen Pelvis W Contrast  09/21/2015  CLINICAL DATA:  Subsequent encounter for lymphoma with concurrent breast cancer. EXAM: CT CHEST, ABDOMEN, AND PELVIS WITH CONTRAST TECHNIQUE: Multidetector CT imaging of the chest, abdomen and pelvis was performed following the standard protocol during bolus administration of intravenous contrast. CONTRAST:  118mL OMNIPAQUE IOHEXOL 300 MG/ML  SOLN COMPARISON:  07/02/2015. FINDINGS: CT CHEST FINDINGS Mediastinum/Lymph Nodes: Right Port-A-Cath tip is at the SVC/ RA junction.  There has been marked interval decrease in right axillary lymphadenopathy. 3.3 cm short axis right axillary lymph node measured previously is now 1.2 cm. Anterior right axillary lymph node which was 2.6 cm (my measurement) on the previous study is now 1.1 cm. 11 mm right paratracheal lymph node measured previously has resolved completely. 2.1 cm necrotic subcarinal lymph node is new in the interval. 13 mm short axis left hilar lymph node was 9 mm previously. Heart size is normal. No pericardial effusion. Lungs/Pleura: Left apical bronchiectasis and scarring is unchanged. There is new airspace disease in the right apex, likely secondary to previous radiation treatment of lymphadenopathy in the right neck the masslike area of airspace consolidation in the left base has decreased, measuring 3.7 x 2.6 cm today compared to 6.0 x 3.8 cm previously. There is right base collapse/ consolidation. Small right pleural effusion has progressed in the interval. Musculoskeletal: Previously described lucent lesion in the right humeral neck is stable. CT ABDOMEN PELVIS FINDINGS Hepatobiliary: Metastatic lesion in the dome of the liver measures 2.8 x 2.6 cm today compared to 2.6 x 2.1 cm previously. Index lesion measured previously in the inferior right liver had a bilobed appearance. The more medial moiety of this lesion has decreased substantially in size in the interval while the more lateral portion of this lesion is unchanged. Overall measurements today are 3.7 x 3.9 cm compared to 3.8 x 5.5 cm previously. Index lesion measured in the lateral segment left liver previously at 2.9 x 1.6 cm now measures 2.7 x 1.8 cm. On today's exam, there is a 7 mm lesion in the medial segment left liver (image 52 series 2) and this same lesion was 1.8 cm on the previous study. Large stone again identified in the gallbladder. Mild biliary duct prominence is stable. Pancreas: No focal mass lesion. No dilatation of the main duct. No intraparenchymal  cyst. No peripancreatic edema. Spleen: 11 mm lesion in the spleen on the previous study has resolved in the interval, but there are multiple new and enlarging splenic lesions. 1.5 cm lesion in the central spleen (image 43 series 2) on today's study was 4 mm previously. 11 mm lesion in the anterior spleen (image 45) is new in the interval. Adrenals/Urinary Tract: No adrenal nodule or mass. Multiple cysts seen in the kidneys bilaterally are stable. No evidence for hydroureter. The urinary bladder appears normal for the degree of distention. Stomach/Bowel: Stomach is nondistended. No gastric wall thickening. No evidence of outlet obstruction. Duodenum is normally positioned as is the ligament of Treitz. No small bowel wall thickening. No small bowel dilatation. The terminal ileum is normal. No gross colonic mass. No colonic wall thickening. No substantial diverticular change.  Vascular/Lymphatic: 10 mm left retrocrural lymph node has progressed in the interval. No gastrohepatic or hepatoduodenal ligament lymphadenopathy. No para-aortic lymphadenopathy. 11 mm short axis right pelvic sidewall lymph node measured previously has progressed at 21 mm. The 16 mm left pelvic sidewall lymph nodes seen previously is now 25 mm. Bilateral inguinal lymphadenopathy has progressed as well. The 11 mm index lymph node in the right inguinal region measured previously is now 28 mm. Reproductive: Fibroid change noted in the uterus. There is no adnexal mass. Other: No intraperitoneal free fluid. Musculoskeletal: 2.7 x 1.8 cm right iliac lesion on the previous study is not substantially changed measuring 3.1 x 1.9 cm. Other areas of metastatic involvement in the bony pelvis are again noted. IMPRESSION: 1. Mixed interval response to therapy. 2. Right subpectoral and axillary lymphadenopathy shows substantial interval decrease in size. Upper mediastinal lymphadenopathy has resolved. However, new mediastinal lymphadenopathy is now evident and  pelvic lymphadenopathy shows interval progression. 3. Multiple liver metastases, some of which are improved and others of which are stable. 4. Index splenic lesion seen on the previous study has resolved but new splenic lesions are evident on today's exam. 5. No substantial change in previously identified bony lesions. 6. Cholelithiasis. Electronically Signed   By: Misty Stanley M.D.   On: 09/21/2015 10:36    Microbiology: Recent Results (from the past 240 hour(s))  TECHNOLOGIST REVIEW     Status: None   Collection Time: 10/15/15  9:54 AM  Result Value Ref Range Status   Technologist Review mod. ovalos present  Final     Labs: Basic Metabolic Panel:  Recent Labs Lab 10/15/15 1222 10/16/15 0420  NA 137 138  K 4.1 4.2  CL 108 108  CO2 20* 21*  GLUCOSE 151* 133*  BUN 36* 26*  CREATININE 0.83 0.66  CALCIUM 9.0 9.2  MG  --  1.8  PHOS  --  3.2   Liver Function Tests:  Recent Labs Lab 10/15/15 1222 10/16/15 0420  AST 61* 71*  ALT 71* 80*  ALKPHOS 168* 162*  BILITOT 0.4 0.7  PROT 4.9* 5.3*  ALBUMIN 2.1* 2.2*   No results for input(s): LIPASE, AMYLASE in the last 168 hours. No results for input(s): AMMONIA in the last 168 hours. CBC:  Recent Labs Lab 10/15/15 0954 10/15/15 1222 10/16/15 0420  WBC 5.9 6.2 6.9  NEUTROABS 5.2 5.4 6.3  HGB 7.7* 7.0* 9.1*  HCT 25.0* 22.3* 28.0*  MCV 81.1 82.6 81.9  PLT 127* 118* 122*   Cardiac Enzymes: No results for input(s): CKTOTAL, CKMB, CKMBINDEX, TROPONINI in the last 168 hours. BNP: BNP (last 3 results)  Recent Labs  09/27/15 2150  BNP 64.6    ProBNP (last 3 results) No results for input(s): PROBNP in the last 8760 hours.  CBG: No results for input(s): GLUCAP in the last 168 hours.     SignedFlorencia Reasons MD, PhD  [facility] 10/19/2015, 8:16 AM

## 2015-11-04 DEATH — deceased

## 2015-11-21 IMAGING — CT CT ABD-PELV W/ CM
2 of 5 series · 14 of 46 positions shown, 16 images · IV contrast (OMNIPAQUE)
Comparison: 07/02/2015.

CLINICAL DATA: Subsequent encounter for lymphoma with concurrent
breast cancer.

EXAM:
CT CHEST, ABDOMEN, AND PELVIS WITH CONTRAST
TECHNIQUE: Multidetector CT imaging of the chest, abdomen and pelvis was
performed following the standard protocol during bolus
administration of intravenous contrast.
CONTRAST:  100mL OMNIPAQUE IOHEXOL 300 MG/ML  SOLN

[Series 2: cap with st · axial · 0.73mm/px · z∈[-528,-68]mm · 11 of 106 slices shown, 13 images]
[im 7/106  soft-tissue]
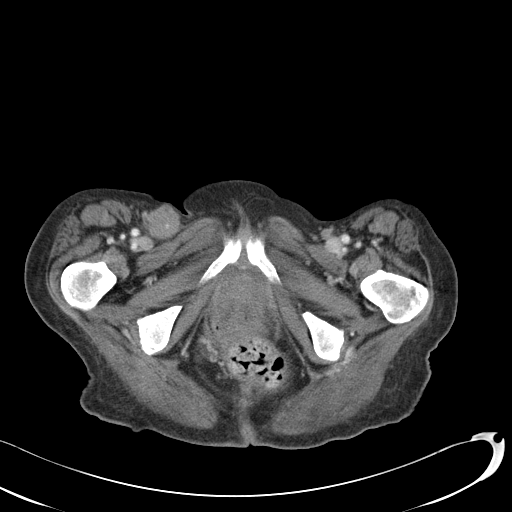
[im 7/106  bone]
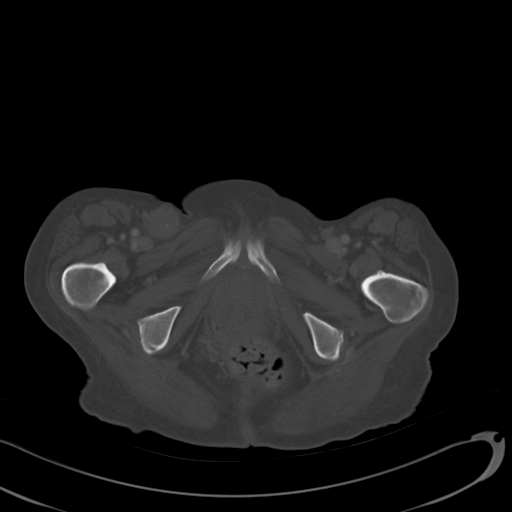
[im 19/106  soft-tissue]
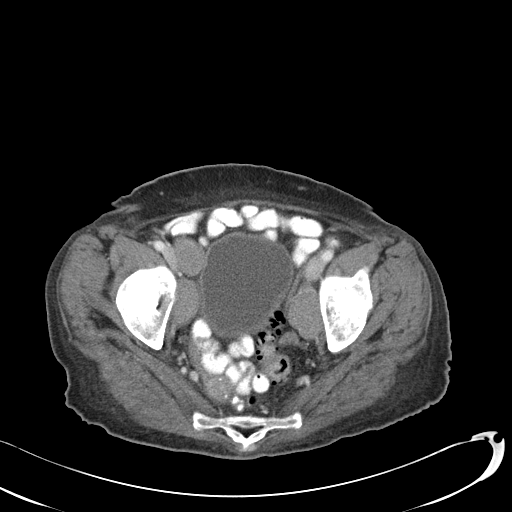
[im 25/106  soft-tissue]
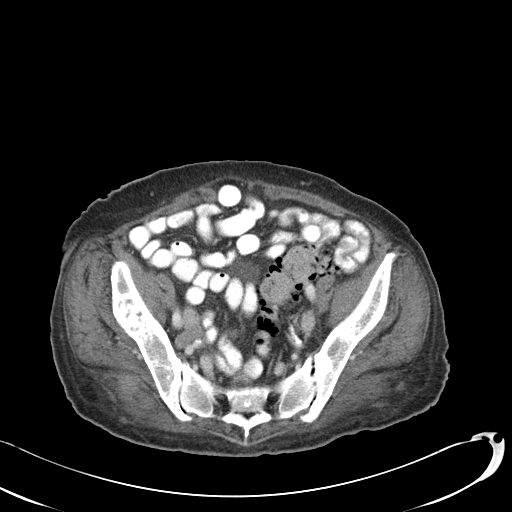
[im 38/106  soft-tissue]
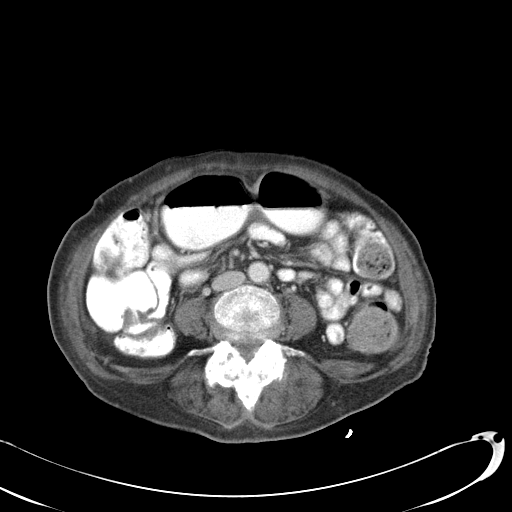
[im 44/106  soft-tissue]
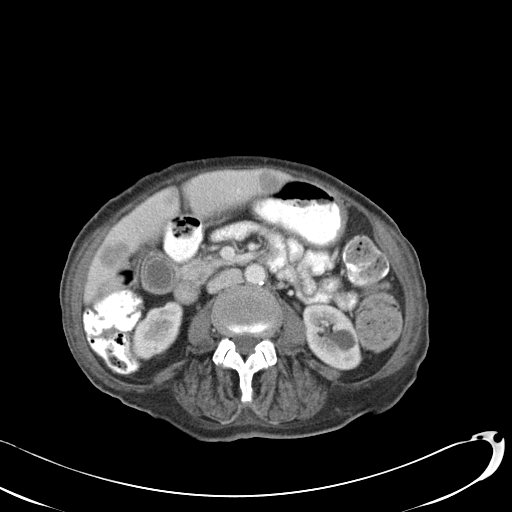
[im 56/106  soft-tissue]
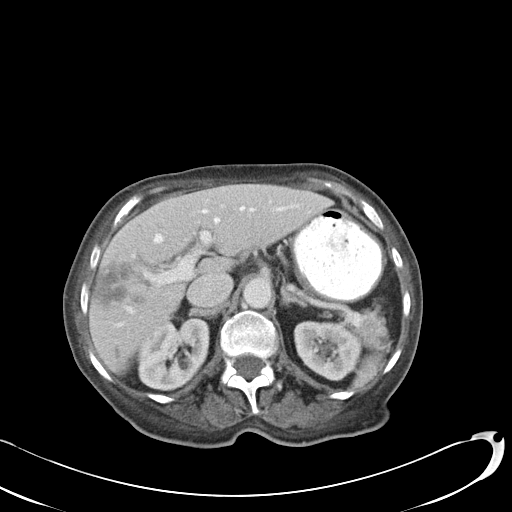
[im 62/106  soft-tissue]
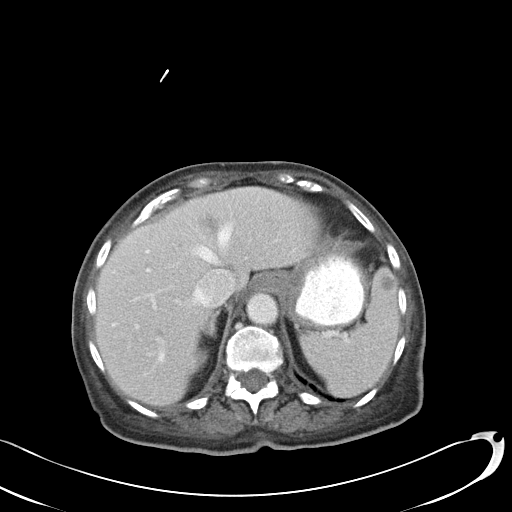
[im 68/106  soft-tissue]
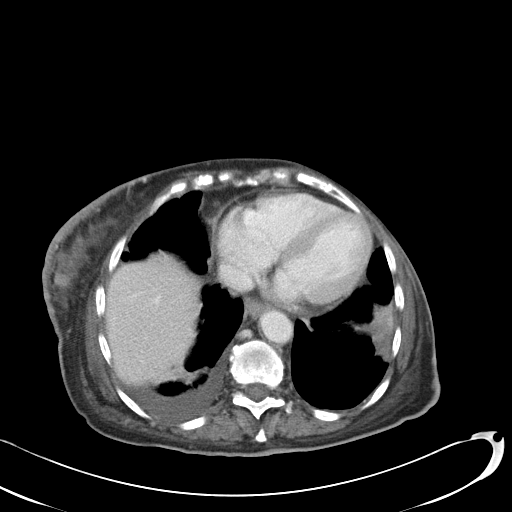
[im 81/106  soft-tissue]
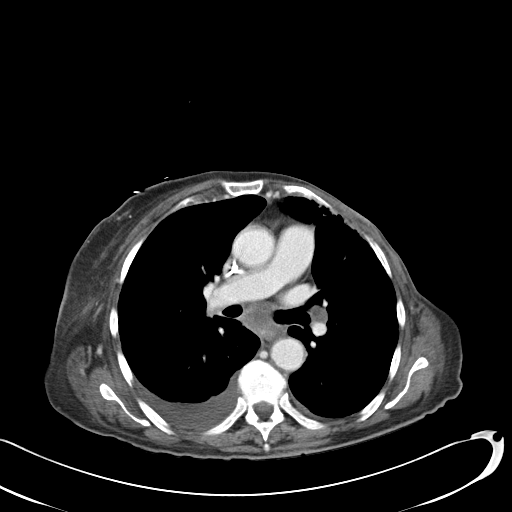
[im 81/106  bone]
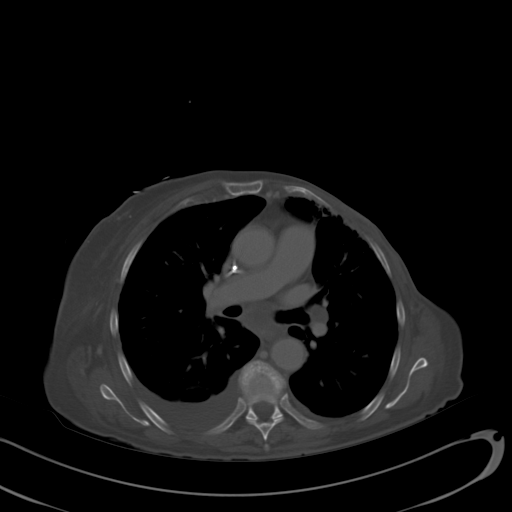
[im 87/106  soft-tissue]
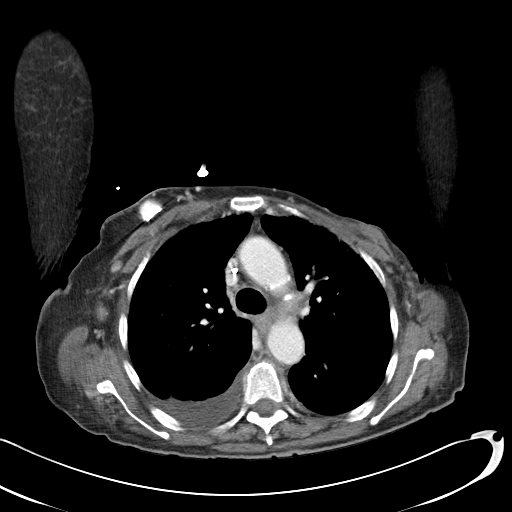
[im 99/106  soft-tissue]
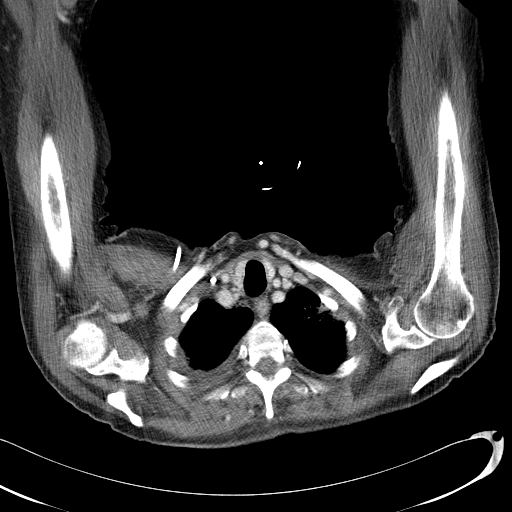

[Series 602: <mpr thick range> · coronal · 1.03mm/px · 3 of 85 slices shown]
[im 29/85  soft-tissue]
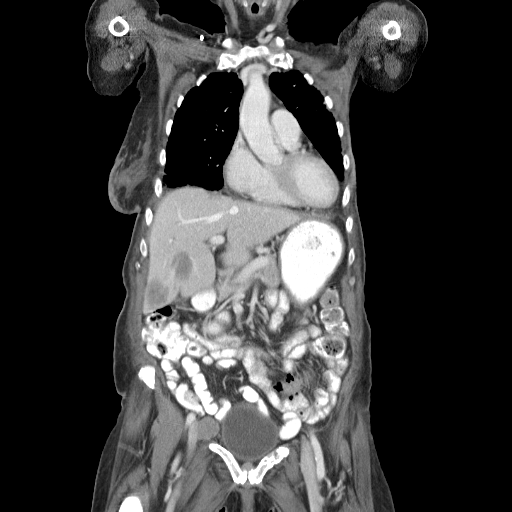
[im 38/85  soft-tissue]
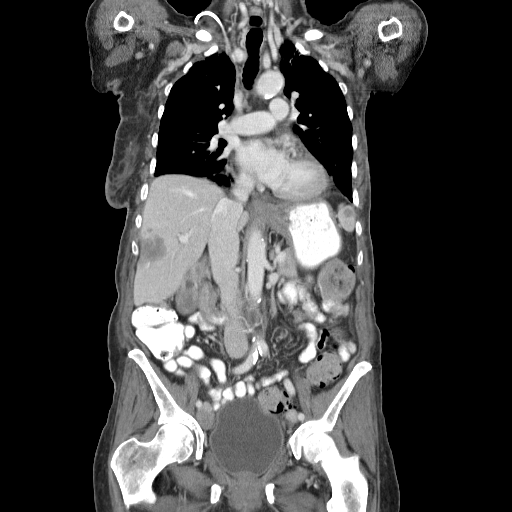
[im 47/85  soft-tissue]
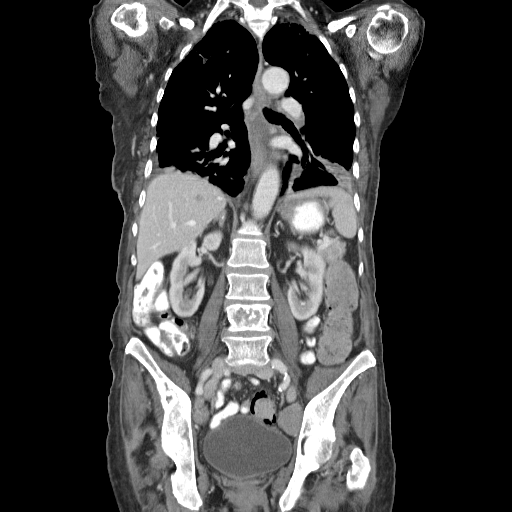

[14 of 46 positions shown; findings below may reference images not displayed]

FINDINGS: CT CHEST FINDINGS

Mediastinum/Lymph Nodes: Right Port-A-Cath tip is at the SVC/ RA
junction. There has been marked interval decrease in right axillary
lymphadenopathy. 3.3 cm short axis right axillary lymph node
measured previously is now 1.2 cm. Anterior right axillary lymph
node which was 2.6 cm (my measurement) on the previous study is now
1.1 cm. 11 mm right paratracheal lymph node measured previously has
resolved completely. 2.1 cm necrotic subcarinal lymph node is new in
the interval. 13 mm short axis left hilar lymph node was 9 mm
previously. Heart size is normal. No pericardial effusion.

Lungs/Pleura: Left apical bronchiectasis and scarring is unchanged.
There is new airspace disease in the right apex, likely secondary to
previous radiation treatment of lymphadenopathy in the right neck
the masslike area of airspace consolidation in the left base has
decreased, measuring 3.7 x 2.6 cm today compared to 6.0 x 3.8 cm
previously. There is right base collapse/ consolidation. Small right
pleural effusion has progressed in the interval.

Musculoskeletal: Previously described lucent lesion in the right
humeral neck is stable.

CT ABDOMEN PELVIS FINDINGS

Hepatobiliary: Metastatic lesion in the dome of the liver measures
2.8 x 2.6 cm today compared to 2.6 x 2.1 cm previously. Index lesion
measured previously in the inferior right liver had a bilobed
appearance. The more medial moiety of this lesion has decreased
substantially in size in the interval while the more lateral portion
of this lesion is unchanged. Overall measurements today are 3.7 x
3.9 cm compared to 3.8 x 5.5 cm previously. Index lesion measured in
the lateral segment left liver previously at 2.9 x 1.6 cm now
measures 2.7 x 1.8 cm. On today's exam, there is a 7 mm lesion in
the medial segment left liver (image 52 series 2) and this same
lesion was 1.8 cm on the previous study.

Large stone again identified in the gallbladder. Mild biliary duct
prominence is stable.

Pancreas: No focal mass lesion. No dilatation of the main duct. No
intraparenchymal cyst. No peripancreatic edema.

Spleen: 11 mm lesion in the spleen on the previous study has
resolved in the interval, but there are multiple new and enlarging
splenic lesions. 1.5 cm lesion in the central spleen (image 43
series 2) on today's study was 4 mm previously. 11 mm lesion in the
anterior spleen (image 45) is new in the interval.

Adrenals/Urinary Tract: No adrenal nodule or mass. Multiple cysts
seen in the kidneys bilaterally are stable. No evidence for
hydroureter. The urinary bladder appears normal for the degree of
distention.

Stomach/Bowel: Stomach is nondistended. No gastric wall thickening.
No evidence of outlet obstruction. Duodenum is normally positioned
as is the ligament of Treitz. No small bowel wall thickening. No
small bowel dilatation. The terminal ileum is normal. No gross
colonic mass. No colonic wall thickening. No substantial
diverticular change.

Vascular/Lymphatic: 10 mm left retrocrural lymph node has progressed
in the interval. No gastrohepatic or hepatoduodenal ligament
lymphadenopathy. No para-aortic lymphadenopathy. 11 mm short axis
right pelvic sidewall lymph node measured previously has progressed
at 21 mm. The 16 mm left pelvic sidewall lymph nodes seen previously
is now 25 mm. Bilateral inguinal lymphadenopathy has progressed as
well. The 11 mm index lymph node in the right inguinal region
measured previously is now 28 mm.

Reproductive: Fibroid change noted in the uterus. There is no
adnexal mass.

Other: No intraperitoneal free fluid.

Musculoskeletal: 2.7 x 1.8 cm right iliac lesion on the previous
study is not substantially changed measuring 3.1 x 1.9 cm. Other
areas of metastatic involvement in the bony pelvis are again noted.
IMPRESSION: 1. Mixed interval response to therapy.
2. Right subpectoral and axillary lymphadenopathy shows substantial
interval decrease in size. Upper mediastinal lymphadenopathy has
resolved. However, new mediastinal lymphadenopathy is now evident
and pelvic lymphadenopathy shows interval progression.
3. Multiple liver metastases, some of which are improved and others
of which are stable.
4. Index splenic lesion seen on the previous study has resolved but
new splenic lesions are evident on today's exam.
5. No substantial change in previously identified bony lesions.
6. Cholelithiasis.

## 2022-04-17 ENCOUNTER — Encounter: Payer: Self-pay | Admitting: Internal Medicine
